# Patient Record
Sex: Female | Born: 1955 | Race: Black or African American | Hispanic: No | Marital: Married | Smoking: Never smoker
Health system: Southern US, Community
[De-identification: ages and names within clinical notes are randomized; demographics above are authoritative.]

## PROBLEM LIST (undated history)

## (undated) DIAGNOSIS — S065XAA Traumatic subdural hemorrhage with loss of consciousness status unknown, initial encounter: Secondary | ICD-10-CM

## (undated) DIAGNOSIS — IMO0001 Reserved for inherently not codable concepts without codable children: Secondary | ICD-10-CM

## (undated) DIAGNOSIS — D649 Anemia, unspecified: Secondary | ICD-10-CM

## (undated) DIAGNOSIS — N186 End stage renal disease: Secondary | ICD-10-CM

## (undated) DIAGNOSIS — K5900 Constipation, unspecified: Secondary | ICD-10-CM

## (undated) DIAGNOSIS — I10 Essential (primary) hypertension: Secondary | ICD-10-CM

## (undated) DIAGNOSIS — R079 Chest pain, unspecified: Principal | ICD-10-CM

## (undated) DIAGNOSIS — K219 Gastro-esophageal reflux disease without esophagitis: Secondary | ICD-10-CM

## (undated) DIAGNOSIS — C801 Malignant (primary) neoplasm, unspecified: Secondary | ICD-10-CM

## (undated) DIAGNOSIS — C50919 Malignant neoplasm of unspecified site of unspecified female breast: Secondary | ICD-10-CM

## (undated) DIAGNOSIS — I509 Heart failure, unspecified: Secondary | ICD-10-CM

## (undated) DIAGNOSIS — D131 Benign neoplasm of stomach: Secondary | ICD-10-CM

## (undated) DIAGNOSIS — R011 Cardiac murmur, unspecified: Secondary | ICD-10-CM

## (undated) DIAGNOSIS — T4145XA Adverse effect of unspecified anesthetic, initial encounter: Secondary | ICD-10-CM

## (undated) DIAGNOSIS — Z992 Dependence on renal dialysis: Secondary | ICD-10-CM

## (undated) DIAGNOSIS — I499 Cardiac arrhythmia, unspecified: Secondary | ICD-10-CM

## (undated) DIAGNOSIS — M1990 Unspecified osteoarthritis, unspecified site: Secondary | ICD-10-CM

## (undated) HISTORY — PX: BUNIONECTOMY: SHX129

## (undated) HISTORY — DX: Unspecified osteoarthritis, unspecified site: M19.90

## (undated) HISTORY — DX: Essential (primary) hypertension: I10

## (undated) HISTORY — PX: KNEE ARTHROSCOPY: SUR90

## (undated) HISTORY — DX: Gastro-esophageal reflux disease without esophagitis: K21.9

## (undated) HISTORY — DX: Benign neoplasm of stomach: D13.1

## (undated) HISTORY — PX: MASTECTOMY: SHX3

## (undated) HISTORY — DX: Dependence on renal dialysis: Z99.2

## (undated) HISTORY — PX: COLONOSCOPY: SHX174

## (undated) HISTORY — PX: BREAST BIOPSY: SHX20

## (undated) HISTORY — DX: End stage renal disease: N18.6

## (undated) HISTORY — DX: Chest pain, unspecified: R07.9

## (undated) HISTORY — DX: Malignant neoplasm of unspecified site of unspecified female breast: C50.919

---

## 1995-01-17 HISTORY — PX: ROTATOR CUFF REPAIR: SHX139

## 1998-03-26 ENCOUNTER — Emergency Department (HOSPITAL_COMMUNITY): Admission: EM | Admit: 1998-03-26 | Discharge: 1998-03-26 | Payer: Self-pay | Admitting: Emergency Medicine

## 2000-04-04 ENCOUNTER — Encounter (HOSPITAL_BASED_OUTPATIENT_CLINIC_OR_DEPARTMENT_OTHER): Payer: Self-pay | Admitting: General Surgery

## 2000-04-04 ENCOUNTER — Encounter: Admission: RE | Admit: 2000-04-04 | Discharge: 2000-04-04 | Payer: Self-pay | Admitting: General Surgery

## 2000-04-04 ENCOUNTER — Encounter (INDEPENDENT_AMBULATORY_CARE_PROVIDER_SITE_OTHER): Payer: Self-pay | Admitting: Specialist

## 2000-04-04 ENCOUNTER — Other Ambulatory Visit: Admission: RE | Admit: 2000-04-04 | Discharge: 2000-04-04 | Payer: Self-pay | Admitting: General Surgery

## 2000-10-25 ENCOUNTER — Encounter: Payer: Self-pay | Admitting: Gastroenterology

## 2000-10-25 ENCOUNTER — Ambulatory Visit (HOSPITAL_COMMUNITY): Admission: RE | Admit: 2000-10-25 | Discharge: 2000-10-25 | Payer: Self-pay | Admitting: Gastroenterology

## 2000-10-30 ENCOUNTER — Encounter (HOSPITAL_BASED_OUTPATIENT_CLINIC_OR_DEPARTMENT_OTHER): Payer: Self-pay | Admitting: General Surgery

## 2000-11-01 ENCOUNTER — Ambulatory Visit (HOSPITAL_COMMUNITY): Admission: RE | Admit: 2000-11-01 | Discharge: 2000-11-02 | Payer: Self-pay | Admitting: General Surgery

## 2000-11-01 ENCOUNTER — Encounter (INDEPENDENT_AMBULATORY_CARE_PROVIDER_SITE_OTHER): Payer: Self-pay | Admitting: *Deleted

## 2000-11-01 ENCOUNTER — Encounter (HOSPITAL_BASED_OUTPATIENT_CLINIC_OR_DEPARTMENT_OTHER): Payer: Self-pay | Admitting: General Surgery

## 2001-01-26 ENCOUNTER — Emergency Department (HOSPITAL_COMMUNITY): Admission: EM | Admit: 2001-01-26 | Discharge: 2001-01-26 | Payer: Self-pay | Admitting: Emergency Medicine

## 2001-08-21 ENCOUNTER — Ambulatory Visit (HOSPITAL_COMMUNITY): Admission: RE | Admit: 2001-08-21 | Discharge: 2001-08-21 | Payer: Self-pay | Admitting: Family Medicine

## 2001-11-21 ENCOUNTER — Encounter: Payer: Self-pay | Admitting: Emergency Medicine

## 2001-11-21 ENCOUNTER — Emergency Department (HOSPITAL_COMMUNITY): Admission: EM | Admit: 2001-11-21 | Discharge: 2001-11-21 | Payer: Self-pay | Admitting: Emergency Medicine

## 2003-01-08 ENCOUNTER — Ambulatory Visit (HOSPITAL_COMMUNITY): Admission: RE | Admit: 2003-01-08 | Discharge: 2003-01-08 | Payer: Self-pay | Admitting: Obstetrics and Gynecology

## 2003-01-17 DIAGNOSIS — T8859XA Other complications of anesthesia, initial encounter: Secondary | ICD-10-CM

## 2003-01-17 HISTORY — PX: CHOLECYSTECTOMY: SHX55

## 2003-01-17 HISTORY — DX: Other complications of anesthesia, initial encounter: T88.59XA

## 2003-01-17 HISTORY — PX: ABDOMINAL HYSTERECTOMY: SHX81

## 2003-02-12 ENCOUNTER — Encounter: Admission: RE | Admit: 2003-02-12 | Discharge: 2003-02-12 | Payer: Self-pay | Admitting: Nephrology

## 2003-03-27 ENCOUNTER — Encounter: Admission: RE | Admit: 2003-03-27 | Discharge: 2003-03-27 | Payer: Self-pay | Admitting: Obstetrics and Gynecology

## 2003-07-24 ENCOUNTER — Emergency Department (HOSPITAL_COMMUNITY): Admission: EM | Admit: 2003-07-24 | Discharge: 2003-07-24 | Payer: Self-pay | Admitting: Family Medicine

## 2003-08-13 ENCOUNTER — Other Ambulatory Visit: Admission: RE | Admit: 2003-08-13 | Discharge: 2003-08-13 | Payer: Self-pay | Admitting: Obstetrics and Gynecology

## 2003-08-27 ENCOUNTER — Inpatient Hospital Stay (HOSPITAL_COMMUNITY): Admission: RE | Admit: 2003-08-27 | Discharge: 2003-08-29 | Payer: Self-pay | Admitting: Obstetrics and Gynecology

## 2003-08-27 ENCOUNTER — Encounter (INDEPENDENT_AMBULATORY_CARE_PROVIDER_SITE_OTHER): Payer: Self-pay | Admitting: Specialist

## 2003-11-03 ENCOUNTER — Emergency Department (HOSPITAL_COMMUNITY): Admission: EM | Admit: 2003-11-03 | Discharge: 2003-11-03 | Payer: Self-pay | Admitting: Emergency Medicine

## 2004-08-05 ENCOUNTER — Other Ambulatory Visit: Admission: RE | Admit: 2004-08-05 | Discharge: 2004-08-05 | Payer: Self-pay | Admitting: Obstetrics and Gynecology

## 2004-09-08 ENCOUNTER — Encounter: Admission: RE | Admit: 2004-09-08 | Discharge: 2004-12-07 | Payer: Self-pay | Admitting: Internal Medicine

## 2004-09-15 ENCOUNTER — Encounter: Admission: RE | Admit: 2004-09-15 | Discharge: 2004-09-15 | Payer: Self-pay | Admitting: Family Medicine

## 2005-09-15 ENCOUNTER — Other Ambulatory Visit: Admission: RE | Admit: 2005-09-15 | Discharge: 2005-09-15 | Payer: Self-pay | Admitting: Obstetrics and Gynecology

## 2005-09-20 ENCOUNTER — Encounter: Admission: RE | Admit: 2005-09-20 | Discharge: 2005-09-20 | Payer: Self-pay | Admitting: Obstetrics and Gynecology

## 2006-05-03 ENCOUNTER — Emergency Department (HOSPITAL_COMMUNITY): Admission: EM | Admit: 2006-05-03 | Discharge: 2006-05-03 | Payer: Self-pay | Admitting: Emergency Medicine

## 2006-06-15 ENCOUNTER — Encounter: Admission: RE | Admit: 2006-06-15 | Discharge: 2006-09-13 | Payer: Self-pay | Admitting: Internal Medicine

## 2006-11-28 ENCOUNTER — Encounter (HOSPITAL_COMMUNITY): Admission: RE | Admit: 2006-11-28 | Discharge: 2007-01-28 | Payer: Self-pay | Admitting: Nephrology

## 2007-02-08 ENCOUNTER — Emergency Department (HOSPITAL_COMMUNITY): Admission: EM | Admit: 2007-02-08 | Discharge: 2007-02-08 | Payer: Self-pay | Admitting: Emergency Medicine

## 2007-10-31 ENCOUNTER — Emergency Department (HOSPITAL_COMMUNITY): Admission: EM | Admit: 2007-10-31 | Discharge: 2007-10-31 | Payer: Self-pay | Admitting: Family Medicine

## 2010-02-06 ENCOUNTER — Encounter: Payer: Self-pay | Admitting: Nephrology

## 2010-06-03 NOTE — Op Note (Signed)
[place]. [facility]  Patient:    Karen [name], Karen [name] Visit Number: [id number] MRN: [id number]          Service Type: DSU Location: [phone] Attending Physician:  Sherolyn Buba Dictated by:   Candee Furbish. Bubba Camp, M.D. Proc. Date: 11/01/00 Admit Date:  11/01/2000                             Operative Report  PREOPERATIVE DIAGNOSIS:  Chronic calculous cholecystitis.  POSTOPERATIVE DIAGNOSIS:  Chronic calculous cholecystitis.  PROCEDURE:  Laparoscopic cholecystectomy with intraoperative cholangiogram.  SURGEON:  Saralyn Pilar L. Bubba Camp, M.D.  ASSISTANT:  Maia Plan. Lindon Romp, M.D.  ANESTHESIA:  General.  CLINICAL NOTE:  This patient is a 55 year old woman presenting with upper abdominal pain associated with nausea and on gallbladder ultrasound noted to have cholelithiasis.  Liver function studies, amylase within normal limits. No history of fevers, chills, or jaundice.  Brought now to the operating room for laparoscopic cholecystectomy.  DESCRIPTION OF PROCEDURE:  Following the induction of satisfactory general anesthesia with the patient positioned supinely, the abdomen was prepped and draped routinely.  Open laparoscopy at the umbilicus and insertion of the Hasson cannula is carried out.  The peritoneal cavity inflated to 14 mmHg pressure using carbon dioxide, camera inserted, and a visual exploration carried out.  The liver edges were sharp and the surfaces smooth.  The anterior gastric wall, duodenal sweep were somewhat tethered and scarred up to the gallbladder.  The pelvic organs were not visualized.  None of the small and large intestine appeared to be abnormal.  Under direct vision, epigastric and lateral ports placed.  The gallbladder was grasped and retracted cephalad and multiple adhesions to the gallbladder wall were taken down, exposing the ampulla.  The ampulla was grasped and dissection carried down in the region of the ampulla to  isolate the cystic artery and cystic duct.  The cystic artery was doubly clipped and transected.  The cystic duct was identified, traced up to its entry into the gallbladder, and clipped proximally and opened.  I used a Reddick catheter to insert a 14-gauge Angiocath into the cystic duct and used one-half strength Hypaque and injected the biliary system.  The resulting cholangiogram showed free flow of contrast to the duodenum and no filling defects, and upper radicles appeared to be normal.  Cholangiocatheter was removed, the cystic duct doubly clipped and transected and the gallbladder dissected free from the liver bed using electrocautery.  At the end of the dissection, the liver bed was again inspected for hemostasis and noted to be dry.  The right upper quadrant was then thoroughly irrigated.  The camera removed to the epigastric port, the gallbladder retrieved through the umbilical port.  This was forwarded for pathologic evaluation.  Sponge, instrument, and sharp counts were verified and the trocars removed under direct vision.  Wounds closed in layers as follows:  Umbilical wound in two layers with 0 Dexon and 4-0 Dexon.  Epigastric and lateral flank wounds closed with 4-0 Dexon sutures, and all wounds reinforced with Steri-Strips.  Sterile dressings applied.  Anesthetic reversed.  Patient removed from the operating room to the recovery room in stable condition.  She tolerated the procedure well. Dictated by:   Candee Furbish. Bubba Camp, M.D. Attending Physician:  Sherolyn Buba DD:  11/01/00 TD:  11/02/00 Job: [id number]
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
## 2010-06-03 NOTE — Op Note (Signed)
NAME:  Karen [name], Karen [name]                    ACCOUNT NO.:  [id number]   MEDICAL RECORD NO.:  [id number]                   PATIENT TYPE:  INP   LOCATION:  9374                                 FACILITY:  [facility]   PHYSICIAN:  Everett Graff, M.D.                DATE OF BIRTH:  07-22-1955   DATE OF PROCEDURE:  08/27/2003  DATE OF DISCHARGE:                                 OPERATIVE REPORT   PREOPERATIVE DIAGNOSES:  1. Symptomatic uterine prolapse, cystocele and rectocele.  2. Cross renal ectopia.  3. Chronic renal insufficiency.  4. Hypertension.   POSTOPERATIVE DIAGNOSES:  1. Symptomatic uterine prolapse, cystocele and rectocele.  2. Cross renal ectopia.  3. Chronic renal insufficiency.  4. Hypertension.   PROCEDURE:  Total vaginal hysterectomy with anterior and posterior repair,  retrograde pyelogram, cystoscopy and ureteral stent placement.   ANESTHESIA:  General.   ATTENDING PHYSICIAN:  Everett Graff, M.D.   ASSISTANT:  Jon Billings. Elizebeth Koller.   SURGEON FOR CYSTOSCOPY RETROGRADE PYELOGRAM AND URETERAL STENT PLACEMENT:  Hanley Ben, M.D. of urology.   ESTIMATED BLOOD LOSS:  425 mL.   IV FLUIDS:  3400 mL.   URINE OUTPUT:  225 mL.   FINDINGS:  Normal size uterus with stage 3 prolapse, stage 2 cystocele and  stage 1-2 rectocele with normal appearing bilateral ovaries and fallopian  tubes with some dilation bilaterally of fallopian tubes. The patient is  status post BTL.   COMPLICATIONS:  None.   PATHOLOGY SPECIMEN:  Uterus and cervix.   DESCRIPTION OF PROCEDURE:  The patient was taken to the operating room after  the risks, benefits, and alternatives were discussed with the patient. The  patient verbalized an understanding and consent signed and witnessed. The  patient was placed under general anesthesia and prepped and draped in a  normal sterile fashion.  The first part of the procedure was performed by  Dr. Janice Norrie, please see his dictation.  In summary, a  cystoscopy was performed  and bilateral ureteral orifices noted with efflux.  Retrograde pyelogram was  performed and the course of the right ureter was normal to the right kidney.  The left ureter, however, crossed over at the level of approximately L2 to  the kidney also on the right side.  Ureteral stents were placed without  difficulty.  The bladder was emptied with Foley and Foley and ureteral  stents left in place for remainder of procedure.  The cervix was injected  with dilute Pitressin after a weighted speculum and vaginal wall retractor  placed.  The cervix was then circumscribed with the Bovie. The posterior cul-  de-sac was then entered with the Mayo scissors and the uterosacral ligaments  bilaterally clamped, cut and suture ligated with the Heaney clamps.  The  vesicouterine peritoneum was dissected anteriorly and entered with the  Metzenbaum scissors.  In a sequential fashion, the Heaney clamps were used  to clamp, cut and suture ligate using #0  Vicryl, the bilateral cardinal  ligaments and remaining paracervical tissue along with the bilateral uterine  vessels and bilateral parametrial tissue.  The uterus was flipped and  exteriorized and the pedicles were clamped bilaterally with large Kelly  clamps. The pedicles were then cut and suture ligated after a #0 tie was  placed.  There was some bleeding noted at the pedicles and was made  hemostatic with 3-0 Vicryl.  Hemoccult culdoplasty stitch was placed and the  vaginal cuff was closed with #0 Vicryl using interrupted figure-of-eights.  The anterior vaginal wall was then identified and Allis clamps placed.  Dilute Pitressin was injected and the vaginal mucosa dissected away from the  underlying tissue.  Kelly plication stitches were then performed.  The  excess vaginal mucosa was then excised and the vaginal mucosa repaired with  2-0 Vicryl in a running locked fashion. Kelly plication stitches were also  performed with 2-0  Vicryl.  Attention was then turned to the posterior  vaginal wall where Allis clamps were placed and the vaginal mucosa injected  with dilute Pitressin and excised up the midline and the underlying tissue  dissected away from the posterior vaginal mucosa. Plication stitches were  performed once again using 2-0 Vicryl.  The excess posterior vaginal wall  tissue was excised and the posterior vaginal wall was repaired with 2-0  Vicryl in a running locked fashion.  The vagina was packed with 1 inch of  packing surrounded by Estrace. The ureteral stents were then removed without  difficulty.  A rectal was performed and no sutures noted through the rectal  mucosa.  Sponge, lap and needle count was correct. The patient tolerated the  procedure well and waiting currently for patient to be extubated and  returned to the recovery room.                                               Everett Graff, M.D.    AR/MEDQ  D:  08/27/2003  T:  08/28/2003  Job:  [id number]

## 2010-06-03 NOTE — Discharge Summary (Signed)
NAME:  Karen, [name]                    ACCOUNT NO.:  [id number]   MEDICAL RECORD NO.:  [id number]                   PATIENT TYPE:  INP   LOCATION:  9305                                 FACILITY:  [facility]   PHYSICIAN:  Everett Graff, M.D.                DATE OF BIRTH:  January 09, 1956   DATE OF ADMISSION:  08/27/2003  DATE OF DISCHARGE:  08/29/2003                                 DISCHARGE SUMMARY   DISCHARGE DIAGNOSES:  1.  Symptomatic uterine prolapse.  2.  Adenomyosis.  3.  Cystocele.  4.  Rectocele.  5.  Crossed renal ectopia.  6.  Chronic renal insufficiency.  7.  Hypertension.   OPERATION:  On the date of admission the patient underwent a total vaginal  hysterectomy with anterior/posterior repair, retrograde pyelogram with  placement of ureteral stents, and cystoscopy, tolerating all procedures  well.  The patient was found to have a normal-size uterus (stage 3  prolapse), cystocele (grade 2), rectocele (grade 1-2), with normal-appearing  tubes and ovaries.  The patient's ureters also appeared to follow normal  anatomic course and were patent bilaterally.   HISTORY OF PRESENT ILLNESS:  Karen [name] is a 55 year old gravida 2 para  2 who presents for vaginal hysterectomy with anterior/posterior repair  because of symptomatic pelvic relaxation.  Due to the patient's history of  crossed renal ectopia, the patient has been also scheduled to undergo a  retrograde pyelogram with placement of ureteral stents prior to procedure to  isolate positioning of her ureters.  Please see the patient's dictated  History and Physical Examination for details.   PREOPERATIVE PHYSICAL EXAMINATION:  VITAL SIGNS:  Blood pressure 110/70,  weight 210 pounds.  GENERAL:  Within normal limits.  PELVIC:  External genitalia within normal limits.  Vagina within normal  limits except for stage 1 cystocele, stage 1-2 rectocele, and stage 3  uterine prolapse.   HOSPITAL COURSE:  On the date of  admission the patient underwent  aforementioned procedure, tolerating it well.  Postoperative course was  marked by the patient being slow to emerge from anesthesia and was therefore  placed in adult intensive care unit overnight.  The patient later reports  that she has had previous history of being very sensitive to  anesthesia/narcotics.  By the morning of postoperative day #1 the patient,  though groggy, was much more responsive, alert and oriented x3.  Postoperative hemoglobin was 9.4 (preoperative hemoglobin was 10.9).  The  patient continued to progress such that by postoperative day #2 she had  resumed bowel and bladder function and was therefore deemed ready for  discharge home.   DISCHARGE MEDICATIONS:  1.  The patient was advised to continue her prehospital medications.  2.  Iron one tablet twice daily for 6 weeks.  3.  Colace 100 mg twice daily until bowel movements are regular.  4.  Phenergan 12.5 mg one tablet q.6h. as needed for nausea.  5.  Vicodin  one to two tablets q.6h. as needed for severe pain.   FOLLOW-UP:  The patient is scheduled for a 6 weeks postoperative visit with  Dr. Mancel Bale on October 08, 2003 at 4 p.m.  The patient also was advised to  call Dr. Criss Rosales to schedule an appointment for ongoing blood pressure  management.   DISCHARGE INSTRUCTIONS:  The patient was given a copy of [facility] postoperative instruction sheet.  She was further advised to avoid  driving for 2 weeks, heavy lifting for 4 weeks, intercourse for 6 weeks, and  to call should she experience severe abdominal pain or increased vaginal  bleeding.  The patient's diet was without restriction.   FINAL PATHOLOGY:  Uterus and cervix:  Cervix:  Hyperkeratosis, cervicitis,  and nabothian cyst consistent with prolapse; no dysplasia identified.  Weakly-proliferative endometrium, no hyperplasia or carcinoma identified;  adenomyosis; benign uterine serosa.     [name].                    Everett Graff, M.D.    EJP/MEDQ  D:  09/16/2003  T:  09/17/2003  Job:  [id number]

## 2010-06-03 NOTE — Op Note (Signed)
NAME:  Karen [name], Karen [name]                    ACCOUNT NO.:  [id number]   MEDICAL RECORD NO.:  [id number]                   PATIENT TYPE:  INP   LOCATION:  9305                                 FACILITY:  [facility]   PHYSICIAN:  Hanley Ben, M.D.               DATE OF BIRTH:  [birth year]   DATE OF PROCEDURE:  08/27/2003  DATE OF DISCHARGE:                                 OPERATIVE REPORT   PREOPERATIVE DIAGNOSIS:  Vaginal prolapse and crossed renal ectopia.   OPERATION/PROCEDURE:  1. Cystoscopy.  2. Bilateral retrograde pyelograms.  3. Insertion of bilateral ureteral catheters.   SURGEON:  Hanley Ben, M.D.   ANESTHESIA:  General.   INDICATIONS:  The patient is a 56 year old female is scheduled for vaginal  hysterectomy by Dr. Everett Graff.  She had a renal ultrasound done for elevated BUN and creatinine  and was found to have crossed renal ectopia and both kidneys are on the  right side.  At Dr. Mancel Bale request, we will do cystoscopy and bilateral  retrograde pyelogram and insertion of bilateral ureteral catheters.   DESCRIPTION OF PROCEDURE:  Under general anesthesia the patient was prepped  and draped and placed in the dorsal lithotomy position.  A 22 Wappler  cystoscope was inserted in the bladder.  The bladder mucosa is normal.  There is no stone or tumor in the bladder.  The ureteral orifices are in  normal position and shape with clear efflux.  A cone-tip catheter was passed  through the cystoscope into the right ureteral orifice.  Contrast was then  injected through the cone-tip catheter.  The ureter is normal.  The renal  pelvis.  There is no evidence of ureteral obstruction.  The cone-tip  catheter was passed through the left ureteral orifice and contrast was  injected through the cone-tip catheter.  The ureteral crossed the midline in  the area of sacroiliac joint towards the right flank where the right and  left kidneys are located.  There is no evidence of  hydronephrosis.  The cone-  tip catheter was removed and open-end catheter was then passed through the  cystoscope over a glide wire and advanced into the renal pelvis.  The same  procedure was done on the right side.  The cystoscope was then removed and  #16 Foley catheter was then passed in the bladder and both ureteral  catheters passed through the Foley catheter and the Foley catheter was  connected to a drainage bag.  The patient was then left in dorsal lithotomy  position for a hysterectomy by Dr. Mancel Bale.                                               Hanley Ben, M.D.    MN/MEDQ  D:  08/27/2003  T:  08/28/2003  Job:  [id number]   cc:   Everett Graff, M.D.  Fax: [phone]

## 2010-10-06 LAB — POCT URINALYSIS DIP (DEVICE)
Glucose, UA: NEGATIVE
Hgb urine dipstick: NEGATIVE
Nitrite: NEGATIVE
Protein, ur: NEGATIVE
Urobilinogen, UA: 0.2
pH: 7

## 2010-10-06 LAB — GC/CHLAMYDIA PROBE AMP, GENITAL: Chlamydia, DNA Probe: NEGATIVE

## 2010-10-06 LAB — WET PREP, GENITAL
Clue Cells Wet Prep HPF POC: NONE SEEN
WBC, Wet Prep HPF POC: NONE SEEN

## 2010-10-17 LAB — POCT URINALYSIS DIP (DEVICE)
Bilirubin Urine: NEGATIVE
Glucose, UA: NEGATIVE
Ketones, ur: NEGATIVE
Nitrite: NEGATIVE
Operator id: 239701

## 2010-10-17 LAB — WET PREP, GENITAL
Trich, Wet Prep: NONE SEEN
Yeast Wet Prep HPF POC: NONE SEEN

## 2011-01-13 ENCOUNTER — Other Ambulatory Visit (HOSPITAL_COMMUNITY): Payer: Self-pay | Admitting: Nephrology

## 2011-01-13 DIAGNOSIS — I701 Atherosclerosis of renal artery: Secondary | ICD-10-CM

## 2011-01-13 DIAGNOSIS — T8619 Other complication of kidney transplant: Secondary | ICD-10-CM

## 2011-01-20 ENCOUNTER — Ambulatory Visit (HOSPITAL_COMMUNITY)
Admission: RE | Admit: 2011-01-20 | Discharge: 2011-01-20 | Disposition: A | Discharge status: Home / Self Care | Payer: 59 | Source: Ambulatory Visit | Attending: Nephrology | Admitting: Nephrology

## 2011-01-20 DIAGNOSIS — Q638 Other specified congenital malformations of kidney: Secondary | ICD-10-CM | POA: Insufficient documentation

## 2011-01-20 DIAGNOSIS — I701 Atherosclerosis of renal artery: Secondary | ICD-10-CM

## 2011-01-20 DIAGNOSIS — I129 Hypertensive chronic kidney disease with stage 1 through stage 4 chronic kidney disease, or unspecified chronic kidney disease: Secondary | ICD-10-CM | POA: Insufficient documentation

## 2011-01-20 DIAGNOSIS — N189 Chronic kidney disease, unspecified: Secondary | ICD-10-CM | POA: Insufficient documentation

## 2011-01-23 ENCOUNTER — Other Ambulatory Visit (HOSPITAL_COMMUNITY): Payer: Self-pay | Admitting: Nephrology

## 2011-01-23 ENCOUNTER — Inpatient Hospital Stay (HOSPITAL_COMMUNITY): Admission: RE | Admit: 2011-01-23 | Payer: 59 | Source: Ambulatory Visit

## 2011-01-23 DIAGNOSIS — T8619 Other complication of kidney transplant: Secondary | ICD-10-CM

## 2011-06-27 ENCOUNTER — Other Ambulatory Visit: Payer: Self-pay

## 2011-06-27 DIAGNOSIS — N184 Chronic kidney disease, stage 4 (severe): Secondary | ICD-10-CM

## 2011-06-27 DIAGNOSIS — Z01810 Encounter for preprocedural cardiovascular examination: Secondary | ICD-10-CM

## 2011-06-30 ENCOUNTER — Encounter: Payer: Self-pay | Admitting: Vascular Surgery

## 2011-07-03 ENCOUNTER — Encounter: Payer: Self-pay | Admitting: Vascular Surgery

## 2011-07-04 ENCOUNTER — Encounter: Payer: Self-pay | Admitting: Vascular Surgery

## 2011-07-04 ENCOUNTER — Ambulatory Visit (INDEPENDENT_AMBULATORY_CARE_PROVIDER_SITE_OTHER): Payer: 59 | Admitting: Vascular Surgery

## 2011-07-04 ENCOUNTER — Encounter (INDEPENDENT_AMBULATORY_CARE_PROVIDER_SITE_OTHER): Payer: 59 | Admitting: *Deleted

## 2011-07-04 VITALS — BP 137/82 | HR 74 | Temp 98.7°F | Ht 61.0 in | Wt 218.0 lb

## 2011-07-04 DIAGNOSIS — Z992 Dependence on renal dialysis: Secondary | ICD-10-CM | POA: Insufficient documentation

## 2011-07-04 DIAGNOSIS — N184 Chronic kidney disease, stage 4 (severe): Secondary | ICD-10-CM

## 2011-07-04 DIAGNOSIS — N186 End stage renal disease: Secondary | ICD-10-CM | POA: Insufficient documentation

## 2011-07-04 DIAGNOSIS — Z01810 Encounter for preprocedural cardiovascular examination: Secondary | ICD-10-CM

## 2011-07-04 NOTE — Progress Notes (Signed)
The patient presents today for evaluation of AV access. He is a very pleasant 56 year old female with progressive renal insufficiency. She has stage IV kidney disease with creatinine in the mid 3 range. She's had no prior hemodialysis access.  Past Medical History  Diagnosis Date  . Peripheral vascular disease   . Chronic kidney disease     History  Substance Use Topics  . Smoking status: Never Smoker   . Smokeless tobacco: Never Used  . Alcohol Use: No    Family History  Problem Relation Age of Onset  . Diabetes Father   . Heart attack Father   . Diabetes Sister   . Hyperlipidemia Sister   . Hypertension Sister     No Known Allergies  Current outpatient prescriptions:acetaminophen (TYLENOL) 500 MG tablet, Take 500 mg by mouth every 6 (six) hours as needed., Disp: , Rfl: ;  amLODipine (NORVASC) 10 MG tablet, Take 10 mg by mouth daily., Disp: , Rfl: ;  Fe Fum-FePoly-Vit C-Vit B3 (INTEGRA PO), Take by mouth daily., Disp: , Rfl: ;  furosemide (LASIX) 20 MG tablet, Take 20 mg by mouth daily., Disp: , Rfl: ;  losartan (COZAAR) 50 MG tablet, Take 50 mg by mouth daily., Disp: , Rfl:  paricalcitol (ZEMPLAR) 1 MCG capsule, Take 1 mcg by mouth daily., Disp: , Rfl: ;  Fe Fum-FA-B Cmp-C-Zn-Mg-Mn-Cu (HEMATINIC PLUS COMPLEX PO), Take by mouth., Disp: , Rfl: ;  RABEprazole (ACIPHEX) 20 MG tablet, Take 20 mg by mouth daily., Disp: , Rfl:   BP 137/82  Pulse 74  Temp 98.7 F (37.1 C) (Oral)  Ht 5\' 1"  (1.549 m)  Wt 218 lb (98.884 kg)  BMI 41.19 kg/m2  SpO2 100%  Body mass index is 41.19 kg/(m^2).       Review of systems positive for pain in her legs with walking and swelling in her legs. Otherwise review of systems negative  Physical exam well-developed well-nourished black female in no acute distress. HEENT normal. She has 2+ radial pulses bilaterally. Neurologically she is grossly intact. Skin without ulcers or rashes. Extremities without major deformities. She does have very small  surface veins on physical exam.  Vascular lab study was ordered and interpreted with the patient. This reveals an extremely small surface veins bilaterally. This is nonvisualized on the right antecubital and above the elbow upper arm. It is in the 1 mm range in the left arm from the mid forearm up to the upper arm. On the basilic vein imaging she also has a small vein which is less than 3 mm throughout its course. I imaged the vein itself with SonoSite to confirm this.  Impression and plan chronic renal insufficiency. She has extremely small cephalic and basilic veins bilaterally. I discussed this at length with [name] area Options for AV graft AV fistula and catheter placement for acute hemodialysis. I do not feel that she has [place] adequate as surface veins for fistula attempt. I explained that she will require graft placement when she approaches need for hemodialysis. She will continue to be followed by the renal service and facet we be reconsult when she approaches need for hemodialysis access

## 2011-07-06 NOTE — Procedures (Unsigned)
CEPHALIC VEIN MAPPING  INDICATION:  Preoperative vein mapping for dialysis access placement.  HISTORY: Chronic kidney disease stage 4, hypertension.  EXAM:  The right cephalic vein measurements range from 0.17 to 0.08 cm.  The right basilic vein is compressible with diameter measurements ranging from 0.26 to 0.19 cm.  The left cephalic vein is compressible with diameter measurements ranging from 0.14 to 0.09 cm.  The left basilic vein is compressible with diameter measurements ranging from 0.25 to 0.22 cm.  IMPRESSION:  Patent right cephalic and basilic veins and patent left cephalic and basilic veins with diameter measurements as described above.  ___________________________________________ Rosetta Posner, M.D.  EM/MEDQ  D:  07/05/2011  T:  07/05/2011  Job:  [id number]

## 2011-09-12 ENCOUNTER — Encounter: Payer: 59 | Admitting: Obstetrics and Gynecology

## 2011-10-06 ENCOUNTER — Encounter: Payer: Self-pay | Admitting: Obstetrics and Gynecology

## 2011-10-06 ENCOUNTER — Ambulatory Visit (INDEPENDENT_AMBULATORY_CARE_PROVIDER_SITE_OTHER): Payer: 59 | Admitting: Obstetrics and Gynecology

## 2011-10-06 VITALS — BP 120/64 | HR 70 | Resp 16 | Ht 61.0 in | Wt 222.0 lb

## 2011-10-06 DIAGNOSIS — Z01419 Encounter for gynecological examination (general) (routine) without abnormal findings: Secondary | ICD-10-CM

## 2011-10-06 NOTE — Progress Notes (Signed)
Contraception Hysterectomy Last pap 2007 Wnl Last Mammo 2012 WNL Last Colonoscopy 2007 WNL Last Dexa Scan Done-- year? WNL Primary MD Jearl Klinefelter Abuse at Home None  No complaints.  Pt nearing end stage renal disease and about to be placed on transplant list.  In prep pt needs to have updated exams with her specialists and lose 30lbs.  Filed Vitals:   10/06/11 1428  BP: 120/64  Pulse: 70  Resp: 16   ROS: noncontributory  Physical Examination: General appearance - alert, well appearing, and in no distress Neck - supple, no significant adenopathy Chest - clear to auscultation, no wheezes, rales or rhonchi, symmetric air entry Heart - normal rate and regular rhythm Abdomen - soft, nontender, nondistended, no masses or organomegaly Breasts - breasts appear normal, no suspicious masses, no skin or nipple changes or axillary nodes Pelvic - normal external genitalia, vulva, vagina, and adnexa, s/p hysterectomy Back exam - no CVAT Extremities - no edema, redness or tenderness in the calves or thighs  A/P No further paps required secondary to h/o hysterectomy for benign disease and pt denies h/o abnl paps Rec AEX q yr mammo in Dec

## 2011-10-09 ENCOUNTER — Encounter: Payer: 59 | Admitting: Obstetrics and Gynecology

## 2012-01-18 ENCOUNTER — Other Ambulatory Visit (HOSPITAL_COMMUNITY): Payer: Self-pay | Admitting: *Deleted

## 2012-01-19 ENCOUNTER — Encounter (HOSPITAL_COMMUNITY)
Admission: RE | Admit: 2012-01-19 | Discharge: 2012-01-19 | Disposition: A | Discharge status: Home / Self Care | Payer: 59 | Source: Ambulatory Visit | Attending: Nephrology | Admitting: Nephrology

## 2012-01-19 DIAGNOSIS — I120 Hypertensive chronic kidney disease with stage 5 chronic kidney disease or end stage renal disease: Secondary | ICD-10-CM | POA: Insufficient documentation

## 2012-01-19 DIAGNOSIS — N186 End stage renal disease: Secondary | ICD-10-CM | POA: Insufficient documentation

## 2012-01-19 MED ORDER — SODIUM CHLORIDE 0.9 % IV SOLN
1020.0000 mg | Freq: Once | INTRAVENOUS | Status: AC
  Administered 2012-01-19: 1020 mg via INTRAVENOUS
  Filled 2012-01-19: qty 34

## 2012-03-06 ENCOUNTER — Ambulatory Visit (HOSPITAL_COMMUNITY)
Admission: RE | Admit: 2012-03-06 | Discharge: 2012-03-06 | Disposition: A | Discharge status: Home / Self Care | Payer: 59 | Source: Ambulatory Visit | Attending: Family Medicine | Admitting: Family Medicine

## 2012-03-06 ENCOUNTER — Other Ambulatory Visit (HOSPITAL_COMMUNITY): Payer: Self-pay | Admitting: Family Medicine

## 2012-03-06 DIAGNOSIS — M79609 Pain in unspecified limb: Secondary | ICD-10-CM | POA: Insufficient documentation

## 2012-03-06 DIAGNOSIS — R52 Pain, unspecified: Secondary | ICD-10-CM

## 2012-03-06 DIAGNOSIS — R609 Edema, unspecified: Secondary | ICD-10-CM

## 2012-03-06 NOTE — Progress Notes (Signed)
VASCULAR LAB PRELIMINARY  PRELIMINARY  PRELIMINARY  PRELIMINARY  Right lower extremity venous duplex completed.    Preliminary report:  Right:  No evidence of DVT, superficial thrombosis, or Baker's cyst.  Karen [name], RVS 03/06/2012, 6:24 PM

## 2012-06-12 ENCOUNTER — Ambulatory Visit (INDEPENDENT_AMBULATORY_CARE_PROVIDER_SITE_OTHER): Payer: 59 | Admitting: Cardiovascular Disease

## 2012-06-12 ENCOUNTER — Encounter: Payer: Self-pay | Admitting: Cardiovascular Disease

## 2012-06-12 VITALS — BP 128/88 | HR 70 | Ht 61.0 in | Wt 219.0 lb

## 2012-06-12 DIAGNOSIS — Z87898 Personal history of other specified conditions: Secondary | ICD-10-CM | POA: Insufficient documentation

## 2012-06-12 DIAGNOSIS — R079 Chest pain, unspecified: Secondary | ICD-10-CM

## 2012-06-12 MED ORDER — PANTOPRAZOLE SODIUM 40 MG PO TBEC: 40.0000 mg | DELAYED_RELEASE_TABLET | Freq: Every day | ORAL | Status: DC

## 2012-06-12 MED ORDER — ISOSORBIDE MONONITRATE ER 30 MG PO TB24: ORAL_TABLET | ORAL | Status: DC

## 2012-06-12 NOTE — Patient Instructions (Signed)
  Your physician wants you to follow-up with him after the stress test.                                                         Your physician has recommended you make the following change in your medication: START PROTONIX 40MG  DAILY; START IMDUR 30MG  1/2 TABLET DAILY-THIS MAY CAUSE A HEADACHE.   Your physician has ordered a CARDIAC STRESS TEST ([place]) Your physician has requested that you have an EXERCISE myoview. For further information please visit [email or web address]. Please follow instruction sheet, as given.

## 2012-06-12 NOTE — Progress Notes (Signed)
06/12/2012 Karen [name]   27-Jul-1955  [id number]  Primary Physician Elyn Peers, MD Primary Cardiologist: Lorretta Harp MD Renae Gloss   HPI:  Karen [name] is a 57 year old married African American female mother of 2, grandmother to potential didn't who currently works as an Sales executive. I saw her 6 years ago in our office for evaluation of a fast heart rate which was worked up and ultimately resolved. Her affect is improved 2 hypertension but otherwise are negative. Her last 3 weeks has noticed substernal chest pain radiating to both upper extremities occurring on a daily basis. She does have reflux but she says the symptoms are different.   Current Outpatient Prescriptions  Medication Sig Dispense Refill  . acetaminophen (TYLENOL) 500 MG tablet Take 500 mg by mouth every 6 (six) hours as needed.      Marland Kitchen amLODipine (NORVASC) 10 MG tablet Take 10 mg by mouth daily.      . Fe Fum-FePoly-Vit C-Vit B3 (INTEGRA PO) Take by mouth daily.      . furosemide (LASIX) 20 MG tablet Take 20 mg by mouth daily.      Marland Kitchen losartan (COZAAR) 50 MG tablet Take 50 mg by mouth daily.      . paricalcitol (ZEMPLAR) 1 MCG capsule Take 1 mcg by mouth daily.       No current facility-administered medications for this visit.    No Known Allergies  History   Social History  . Marital Status: Married    Spouse Name: N/A    Number of Children: N/A  . Years of Education: N/A   Occupational History  . Not on file.   Social History Main Topics  . Smoking status: Never Smoker   . Smokeless tobacco: Never Used  . Alcohol Use: No  . Drug Use: No  . Sexually Active: Yes    Birth Control/ Protection: None     Comment: Hysterectomy   Other Topics Concern  . Not on file   Social History Narrative  . No narrative on file     Review of Systems: General: negative for chills, fever, night sweats or weight changes.  Cardiovascular: negative for chest pain, dyspnea on exertion, edema,  orthopnea, palpitations, paroxysmal nocturnal dyspnea or shortness of breath Dermatological: negative for rash Respiratory: negative for cough or wheezing Urologic: negative for hematuria Abdominal: negative for nausea, vomiting, diarrhea, bright red blood per rectum, melena, or hematemesis Neurologic: negative for visual changes, syncope, or dizziness All other systems reviewed and are otherwise negative except as noted above.    Blood pressure 128/88, pulse 70, height 5\' 1"  (1.549 m), weight 219 lb (99.338 kg).  General appearance: alert and no distress Neck: no adenopathy, no carotid bruit, no JVD, supple, symmetrical, trachea midline and thyroid not enlarged, symmetric, no tenderness/mass/nodules Lungs: clear to auscultation bilaterally Heart: regular rate and rhythm, S1, S2 normal, no murmur, click, rub or gallop Abdomen: soft, non-tender; bowel sounds normal; no masses,  no organomegaly Extremities: extremities normal, atraumatic, no cyanosis or edema Pulses: 2+ and symmetric  EKG normal sinus rhythm at 70 without ST or T wave changes  ASSESSMENT AND PLAN:   Chest pain Based on the patient's age and risk factors will proceed with exercise Myoview stress testing to rule out an ischemic etiology  HTN (hypertension) Well-controlled on current medications      Lorretta Harp MD [facility], [facility] 06/12/2012 1:07 PM

## 2012-06-12 NOTE — Assessment & Plan Note (Signed)
Based on the patient's age and risk factors will proceed with exercise Myoview stress testing to rule out an ischemic etiology

## 2012-06-12 NOTE — Assessment & Plan Note (Signed)
Well-controlled on current medications 

## 2012-06-18 ENCOUNTER — Ambulatory Visit (HOSPITAL_COMMUNITY)
Admission: RE | Admit: 2012-06-18 | Discharge: 2012-06-18 | Disposition: A | Discharge status: Home / Self Care | Payer: 59 | Source: Ambulatory Visit | Attending: Cardiovascular Disease | Admitting: Cardiovascular Disease

## 2012-06-18 DIAGNOSIS — E669 Obesity, unspecified: Secondary | ICD-10-CM | POA: Insufficient documentation

## 2012-06-18 DIAGNOSIS — R002 Palpitations: Secondary | ICD-10-CM | POA: Insufficient documentation

## 2012-06-18 DIAGNOSIS — R0602 Shortness of breath: Secondary | ICD-10-CM | POA: Insufficient documentation

## 2012-06-18 DIAGNOSIS — R5381 Other malaise: Secondary | ICD-10-CM | POA: Insufficient documentation

## 2012-06-18 DIAGNOSIS — R079 Chest pain, unspecified: Secondary | ICD-10-CM | POA: Insufficient documentation

## 2012-06-18 DIAGNOSIS — I10 Essential (primary) hypertension: Secondary | ICD-10-CM | POA: Insufficient documentation

## 2012-06-18 MED ORDER — TECHNETIUM TC 99M SESTAMIBI GENERIC - CARDIOLITE
10.5000 | Freq: Once | INTRAVENOUS | Status: AC | PRN
  Administered 2012-06-18: 11 via INTRAVENOUS

## 2012-06-18 MED ORDER — TECHNETIUM TC 99M SESTAMIBI GENERIC - CARDIOLITE
30.8000 | Freq: Once | INTRAVENOUS | Status: AC | PRN
  Administered 2012-06-18: 30.8 via INTRAVENOUS

## 2012-06-18 NOTE — Procedures (Addendum)
[place] [address] [place] [address] [phone]  Cardiology Nuclear Med Study  Karen [name] is a 57 y.o. female     MRN : [id number]     DOB: April 17, 1955  Procedure Date: 06/18/2012  Nuclear Med Background Indication for Stress Test:  Evaluation for Ischemia History:  NO PRIOR HISTORY REPORTED. Cardiac Risk Factors: Hypertension and Obesity  Symptoms:  Chest Pain, Fatigue, Palpitations and SOB   Nuclear Pre-Procedure Caffeine/Decaff Intake:  7:00pm NPO After: 5:00am   IV Site: R Antecubital  IV 0.9% NS with Angio Cath:  22g  Chest Size (in):  N/A IV Started by: Azucena Cecil, RN  Height: 5\' 1"  (1.549 m)  Cup Size: D  BMI:  Body mass index is 41.4 kg/(m^2). Weight:  219 lb (99.338 kg)   Tech Comments:  N/A    Nuclear Med Study 1 or 2 day study: 1 day  Stress Test Type:  Stress  Order Authorizing Provider:  Quay Burow, MD   Resting Radionuclide: Technetium 61m Sestamibi  Resting Radionuclide Dose: 10.5 mCi   Stress Radionuclide:  Technetium 68m Sestamibi  Stress Radionuclide Dose: 30.8 mCi           Stress Protocol Rest HR: 74 Stress HR: 157  Rest BP: 117/79 Stress BP: 187/74  Exercise Time (min): 5:00 METS: 4.6   Predicted Max HR: 163 bpm % Max HR: 96.32 bpm Rate Pressure Product: 29045  Dose of Adenosine (mg):  n/a Dose of Lexiscan: n/a mg  Dose of Atropine (mg): n/a Dose of Dobutamine: n/a mcg/kg/min (at max HR)  Stress Test Technologist: Leane Para, CCT Nuclear Technologist: Imagene Riches, CNMT   Rest Procedure:  Myocardial perfusion imaging was performed at rest 45 minutes following the intravenous administration of Technetium 47m Sestamibi. Stress Procedure:  The patient performed treadmill exercise using a Bruce  Protocol for 5:00 minutes. The patient stopped due to SOB and CP and denied any chest pain.  There were no significant ST-T wave changes.  Technetium 76m Sestamibi was  injected at peak exercise and myocardial perfusion imaging was performed after a brief delay.  Transient Ischemic Dilatation (Normal <1.22):  0.99 Lung/Heart Ratio (Normal <0.45):  0.40 QGS EDV:  71 ml QGS ESV:  23 ml LV Ejection Fraction: 68%      Rest ECG: NSR - Normal EKG  Stress ECG: No significant change from baseline ECG  QPS Raw Data Images:  Normal; no motion artifact; normal heart/lung ratio. Stress Images:  Normal homogeneous uptake in all areas of the myocardium. Rest Images:  Normal homogeneous uptake in all areas of the myocardium. Subtraction (SDS):  No evidence of ischemia.  Impression Exercise Capacity:  Fair exercise capacity. BP Response:  Normal blood pressure response. Clinical Symptoms:  No significant symptoms noted. ECG Impression:  No significant ST segment change suggestive of ischemia. Comparison with Prior Nuclear Study: No images to compare  Overall Impression:  Normal stress nuclear study.  LV Wall Motion:  NL LV Function; NL Wall Motion   Karen Hoose, MD  06/18/2012 1:27 PM

## 2012-06-26 ENCOUNTER — Encounter: Payer: Self-pay | Admitting: Cardiovascular Disease

## 2012-06-26 ENCOUNTER — Ambulatory Visit (INDEPENDENT_AMBULATORY_CARE_PROVIDER_SITE_OTHER): Payer: 59 | Admitting: Cardiovascular Disease

## 2012-06-26 VITALS — BP 126/62 | HR 80 | Ht 61.0 in | Wt 222.0 lb

## 2012-06-26 DIAGNOSIS — R079 Chest pain, unspecified: Secondary | ICD-10-CM

## 2012-06-26 NOTE — Patient Instructions (Addendum)
Your physician wants you to follow-up in: 2 months with an extender and 4 months with Dr Gwenlyn Found. You will receive a reminder letter in the mail two months in advance. If you don't receive a letter, please call our office to schedule the follow-up appointment.

## 2012-06-26 NOTE — Assessment & Plan Note (Signed)
A Myoview stress test was performed that was normal. I did put her on a low-dose oral nitrate which resulted in mild improvement in her symptoms but she still is getting daily off and on which chest pain which sounds somewhat atypical especially in light of the fact that she has minimal cardiac risk factors. At this point I'm going to continue to follow her clinically I will have her see mid-level provider back in 2 months me back in 4 months

## 2012-06-26 NOTE — Progress Notes (Signed)
06/26/2012 Oleh Genin   [birth year]  [id number]  Primary Physician Elyn Peers, MD Primary Cardiologist:'Sundeep Cary Adora Fridge MD Renae Gloss  HPI:   Karen [name] is a 57 year old married African American female mother of 2, grandmother to 2 grandchildren  who currently works as an Sales executive. I saw her 6 years ago in our office for evaluation of a fast heart rate which was worked up and ultimately resolved. Her cardiac risk factor profile is notable for hypertension but otherwise are negative. For the last 3 weeks prior to her last office visit she had noticed substernal chest pain radiating to both upper extremities occurring on a daily basis. She does have reflux but she says the symptoms are different. I ordered a mild distress test which was entirely normal. I put her on a low-dose nitrate which resulted in mild improvement in her symptoms although she still complains of some chest pain.    Current Outpatient Prescriptions  Medication Sig Dispense Refill  . acetaminophen (TYLENOL) 500 MG tablet Take 500 mg by mouth as needed.       Marland Kitchen amLODipine (NORVASC) 10 MG tablet Take 10 mg by mouth daily.      . Fe Fum-FePoly-Vit C-Vit B3 (INTEGRA PO) Take by mouth daily.      . furosemide (LASIX) 20 MG tablet Take 20 mg by mouth daily.      . isosorbide mononitrate (IMDUR) 30 MG 24 hr tablet Take 1/2 tablet by mouth daily  15 tablet  3  . losartan (COZAAR) 50 MG tablet Take 50 mg by mouth daily.      . pantoprazole (PROTONIX) 40 MG tablet Take 1 tablet (40 mg total) by mouth daily.  30 tablet  11  . paricalcitol (ZEMPLAR) 1 MCG capsule Take 1 mcg by mouth daily.       No current facility-administered medications for this visit.    No Known Allergies  History   Social History  . Marital Status: Married    Spouse Name: N/A    Number of Children: N/A  . Years of Education: N/A   Occupational History  . Not on file.   Social History Main Topics  . Smoking status:  Never Smoker   . Smokeless tobacco: Never Used  . Alcohol Use: No  . Drug Use: No  . Sexually Active: Yes    Birth Control/ Protection: None     Comment: Hysterectomy   Other Topics Concern  . Not on file   Social History Narrative  . No narrative on file     Review of Systems: General: negative for chills, fever, night sweats or weight changes.  Cardiovascular: negative for chest pain, dyspnea on exertion, edema, orthopnea, palpitations, paroxysmal nocturnal dyspnea or shortness of breath Dermatological: negative for rash Respiratory: negative for cough or wheezing Urologic: negative for hematuria Abdominal: negative for nausea, vomiting, diarrhea, bright red blood per rectum, melena, or hematemesis Neurologic: negative for visual changes, syncope, or dizziness All other systems reviewed and are otherwise negative except as noted above.    Blood pressure 126/62, pulse 80, height 5\' 1"  (1.549 m), weight 222 lb (100.699 kg).  General appearance: alert and no distress Neck: no adenopathy, no carotid bruit, no JVD, supple, symmetrical, trachea midline and thyroid not enlarged, symmetric, no tenderness/mass/nodules Lungs: clear to auscultation bilaterally Heart: regular rate and rhythm, S1, S2 normal, no murmur, click, rub or gallop Extremities: extremities normal, atraumatic, no cyanosis or edema  EKG not performed today  ASSESSMENT AND PLAN:  Chest pain A Myoview stress test was performed that was normal. I did put her on a low-dose oral nitrate which resulted in mild improvement in her symptoms but she still is getting daily off and on which chest pain which sounds somewhat atypical especially in light of the fact that she has minimal cardiac risk factors. At this point I'm going to continue to follow her clinically I will have her see mid-level provider back in 2 months me back in 4 months      Lorretta Harp MD [facility], [facility] 06/26/2012 3:13 PM

## 2012-07-05 ENCOUNTER — Encounter: Payer: Self-pay | Admitting: Cardiovascular Disease

## 2012-07-11 ENCOUNTER — Encounter: Payer: Self-pay | Admitting: Internal Medicine

## 2012-08-05 ENCOUNTER — Encounter: Payer: Self-pay | Admitting: Internal Medicine

## 2012-08-08 ENCOUNTER — Ambulatory Visit (INDEPENDENT_AMBULATORY_CARE_PROVIDER_SITE_OTHER): Payer: 59 | Admitting: Internal Medicine

## 2012-08-08 ENCOUNTER — Encounter: Payer: Self-pay | Admitting: Internal Medicine

## 2012-08-08 VITALS — BP 108/76 | HR 64 | Ht 61.0 in | Wt 217.0 lb

## 2012-08-08 DIAGNOSIS — R1013 Epigastric pain: Secondary | ICD-10-CM | POA: Insufficient documentation

## 2012-08-08 DIAGNOSIS — R110 Nausea: Secondary | ICD-10-CM

## 2012-08-08 DIAGNOSIS — Z1211 Encounter for screening for malignant neoplasm of colon: Secondary | ICD-10-CM

## 2012-08-08 DIAGNOSIS — K5900 Constipation, unspecified: Secondary | ICD-10-CM

## 2012-08-08 DIAGNOSIS — D638 Anemia in other chronic diseases classified elsewhere: Secondary | ICD-10-CM

## 2012-08-08 MED ORDER — ONDANSETRON 4 MG PO TBDP: 4.0000 mg | ORAL_TABLET | Freq: Three times a day (TID) | ORAL | Status: DC | PRN

## 2012-08-08 NOTE — Patient Instructions (Addendum)
Continue taking Pantoprazole  We have sent the following medications to your pharmacy for you to pick up at your convenience: Zofran  Follow up in 6-8 weeks in office                                                We are excited to introduce MyChart, a new best-in-class service that provides you online access to important information in your electronic medical record. We want to make it easier for you to view your health information - all in one secure location - when and where you need it. We expect MyChart will enhance the quality of care and service we provide.  When you register for MyChart, you can:    View your test results.    Request appointments and receive appointment reminders via email.    Request medication renewals.    View your medical history, allergies, medications and immunizations.    Communicate with your physician's office through a password-protected site.    Conveniently print information such as your medication lists.  To find out if MyChart is right for you, please talk to a member of our clinical staff today. We will gladly answer your questions about this free health and wellness tool.  If you are age 57 or older and want a member of your family to have access to your record, you must provide written consent by completing a proxy form available at our office. Please speak to our clinical staff about guidelines regarding accounts for patients younger than age 42.  As you activate your MyChart account and need any technical assistance, please call the MyChart technical support line at [phone]) or email your question to [email or web address]. If you email your question(s), please include your name, a return phone number and the best time to reach you.  If you have non-urgent health-related questions, you can send a message to our office through [place] at [email or web address]. If you have a medical emergency, call 911.  Thank you for  using MyChart as your new health and wellness resource!   MyChart licensed from [employer],  1999-2010. Patents Pending.

## 2012-08-08 NOTE — Progress Notes (Signed)
Patient ID: Karen [name], female   DOB: 25-Oct-1955, 57 y.o.   MRN: [id number] HPI: Karen [name] is a 57 year old female with a past medical history of stage IV chronic kidney disease, hypertension, GERD and arthritis who is seen in consultation at the request of Dr. Marval Regal to evaluate epigastric abdominal pain and nausea. The patient is here alone today. She reports she was having upper abdominal bloating and epigastric discomfort associated with eating. She was started on pantoprazole 40 mg daily in the last several months and she notes that this has significantly helped her upper abdominal bloating and pain. She is still dealing with nausea associated with eating, but she feels this might even be slightly better. She denies vomiting. No significant heartburn. No water brash or regurgitation. No dysphagia or odynophagia. She denies early satiety. Her nausea is triggered by eating and tends to happen more frequently when she eats meats. Bowel habits have been somewhat irregular and she is having some constipation. No blood in her stool or melena. No diarrhea. No lower abdominal pain.  She previously had an upper endoscopy and colonoscopy performed by Dr. Cristina Gong in 2007  Past Medical History  Diagnosis Date  . Chronic kidney disease     trying to get on kidney [facility] list at [facility]  . Hypertension   . Chest pain   . Arthritis   . GERD (gastroesophageal reflux disease)     Past Surgical History  Procedure Laterality Date  . Rotator cuff repair  1997    right   . Cholecystectomy  2005  . Abdominal hysterectomy    . Knee arthroscopy      both knees  . Bunionectomy      left and right foot    Current Outpatient Prescriptions  Medication Sig Dispense Refill  . acetaminophen (TYLENOL) 500 MG tablet Take 500 mg by mouth as needed.       Marland Kitchen amLODipine (NORVASC) 10 MG tablet Take 10 mg by mouth daily.      . Fe Fum-FePoly-Vit C-Vit B3 (INTEGRA PO) Take by mouth daily.      .  furosemide (LASIX) 20 MG tablet Take 20 mg by mouth daily.      . isosorbide mononitrate (IMDUR) 30 MG 24 hr tablet Take 1/2 tablet by mouth daily  15 tablet  3  . losartan (COZAAR) 50 MG tablet Take 50 mg by mouth daily.      . pantoprazole (PROTONIX) 40 MG tablet Take 1 tablet (40 mg total) by mouth daily.  30 tablet  11  . paricalcitol (ZEMPLAR) 1 MCG capsule Take 1 mcg by mouth daily.      . ondansetron (ZOFRAN ODT) 4 MG disintegrating tablet Take 1 tablet (4 mg total) by mouth every 8 (eight) hours as needed for nausea.  20 tablet  2   No current facility-administered medications for this visit.    No Known Allergies  Family History  Problem Relation Age of Onset  . Diabetes Father   . Heart attack Father   . Diabetes Sister   . Hyperlipidemia Sister   . Hypertension Sister   . Kidney disease Father   . Kidney disease Mother   . Kidney disease Sister     x2    History  Substance Use Topics  . Smoking status: Never Smoker   . Smokeless tobacco: Never Used  . Alcohol Use: No    ROS: As per history of present illness, otherwise negative  BP 108/76  Pulse 64  Ht 5\' 1"  (1.549 m)  Wt 217 lb (98.431 kg)  BMI 41.02 kg/m2 Constitutional: Well-developed and well-nourished. No distress. HEENT: Normocephalic and atraumatic. Oropharynx is clear and moist. No oropharyngeal exudate. Conjunctivae are normal.  No scleral icterus. Neck: Neck supple. Trachea midline. Cardiovascular: Normal rate, regular rhythm and intact distal pulses.  Pulmonary/chest: Effort normal and breath sounds normal. No wheezing, rales or rhonchi. Abdominal: Soft, obese, nontender, nondistended. Bowel sounds active throughout.  Extremities: no clubbing, cyanosis, or edema Neurological: Alert and oriented to person place and time. Skin: Skin is warm and dry. No rashes noted. Psychiatric: Normal mood and affect. Behavior is normal.  RELEVANT LABS AND IMAGING: Colonoscopy 05/25/2005, [facility]  -- normal colonoscopy to the terminal ileum. Quality of the preparation was excellent. Moderate internal hemorrhoids. Recommended repeat colonoscopy 10 years EGD 05/25/2005 [facility] gastroenterology -- small hiatus hernia, a few gastric polyps, normal examined duodenum. Bilious gastric fluid. It is possible the bile reflux and/or gastric dysmotility The patient's nausea, although it has gotten somewhat better since she tried stopping her iron. Pathology = stomach biopsy findings consistent with fundic gland polyp. Stomach biopsy, antrum reactive gastropathy. No H. pylori. Duodenal biopsy benign small bowel mucosa no active inflammation or villous atrophy identified.  Labs dated 06/25/2012 WBC 5.3, hemoglobin 11.4, MCV 90, platelet 244 AST 16, ALT 10, alkaline phosphatase is 126, total bilirubin 0.2 BUN 44, creatinine 3.35, GFR 17 Iron 56, TIBC 189, iron saturation 30%, ferritin 406  ASSESSMENT/PLAN: 57 year old female with a past medical history of stage IV chronic kidney disease, hypertension, GERD and arthritis who is seen in consultation at the request of Dr. Marval Regal to evaluate epigastric abdominal pain and nausea.  1.  Dyspepsia and nausea -- she has had trouble with dyspepsia and nausea in the past and was evaluated with upper endoscopy in 2007. Her symptoms are very similar at this time, and she has had a very good response to the addition of pantoprazole 40 mg daily. Most of her dyspepsia has resolved. She is still having nausea. The question of gastroparesis is also raised by her symptomatology. We discussed workup including repeat upper endoscopy, gastric imaging study, or a trial of an anti-emetic.  We will give her a trial of ondansetron 4 mg every 8 hours when necessary for nausea. I would like for her to continue pantoprazole 40 mg daily. I'll see her back in 6 weeks' time. She is no better we will consider repeating the upper endoscopy and ordering a gastric emptying study. She is happy  with this plan.  2.  Mild constipation -- I recommended a probiotic and she was given samples of Restora.  We will followup with this problem and if not better consider adding Colace or MiraLax.  3.  CRC screening -- she will be due repeat colonoscopy in May 2017 for screening  4.  Anemia -- iron studies consistent with chronic disease, likely related to her renal insufficiency.  Per Dr. Marval Regal

## 2012-08-26 ENCOUNTER — Ambulatory Visit: Payer: 59 | Admitting: Cardiology

## 2012-09-23 ENCOUNTER — Encounter: Payer: Self-pay | Admitting: Internal Medicine

## 2012-09-25 ENCOUNTER — Ambulatory Visit: Payer: 59 | Admitting: Internal Medicine

## 2012-10-25 ENCOUNTER — Encounter: Payer: Self-pay | Admitting: Cardiovascular Disease

## 2012-11-18 ENCOUNTER — Other Ambulatory Visit: Payer: Self-pay | Admitting: Cardiovascular Disease

## 2012-11-19 NOTE — Telephone Encounter (Signed)
Rx was sent to pharmacy electronically. 

## 2012-12-10 ENCOUNTER — Ambulatory Visit: Payer: 59 | Admitting: Cardiovascular Disease

## 2012-12-14 ENCOUNTER — Other Ambulatory Visit: Payer: Self-pay | Admitting: Internal Medicine

## 2012-12-27 ENCOUNTER — Encounter: Payer: Self-pay | Admitting: Cardiovascular Disease

## 2012-12-27 ENCOUNTER — Ambulatory Visit (INDEPENDENT_AMBULATORY_CARE_PROVIDER_SITE_OTHER): Payer: 59 | Admitting: Cardiovascular Disease

## 2012-12-27 VITALS — BP 136/78 | HR 85 | Ht 61.0 in | Wt 220.5 lb

## 2012-12-27 DIAGNOSIS — R079 Chest pain, unspecified: Secondary | ICD-10-CM

## 2012-12-27 DIAGNOSIS — I10 Essential (primary) hypertension: Secondary | ICD-10-CM

## 2012-12-27 NOTE — Patient Instructions (Signed)
Your physician wants you to follow-up in: 6 months with Dr Berry. You will receive a reminder letter in the mail two months in advance. If you don't receive a letter, please call our office to schedule the follow-up appointment.  

## 2012-12-27 NOTE — Assessment & Plan Note (Signed)
Controlled on current medications 

## 2012-12-27 NOTE — Assessment & Plan Note (Signed)
Since I saw her back 6 months ago she's had reduced frequency and severity of chest pain. She did have a negative Myoview stress test. I will see her back in 6 months

## 2012-12-27 NOTE — Progress Notes (Signed)
12/27/2012 Oleh Genin   04-03-1955  [id number]  Primary Physician Elyn Peers, MD Primary Cardiologist: Karen Harp MD Renae Gloss   HPI:  Karen [name] is a 57 year old married African American female mother of 2, grandmother to 2 grandchildren who currently works as an Sales executive. I saw her 6 years ago in our office for evaluation of a fast heart rate which was worked up and ultimately resolved. Her cardiac risk factor profile is notable for hypertension but otherwise are negative. For the last 3 weeks prior to her last office visit she had noticed substernal chest pain radiating to both upper extremities occurring on a daily basis. She does have reflux but she says the symptoms are different. I ordered a Myoview stress test which was entirely normal. I put her on a low-dose nitrate which resulted in mild improvement in her symptoms although she still complains of some chest pain since I saw her back her pain has decreased in frequency and severity. She did undergo vein mapping in anticipation of possible hemodialysis in the future.    Current Outpatient Prescriptions  Medication Sig Dispense Refill  . acetaminophen (TYLENOL) 500 MG tablet Take 500 mg by mouth as needed.       Marland Kitchen amLODipine (NORVASC) 10 MG tablet Take 10 mg by mouth daily.      . Fe Fum-FePoly-Vit C-Vit B3 (INTEGRA PO) Take by mouth daily.      . furosemide (LASIX) 20 MG tablet Take 20 mg by mouth daily.      . isosorbide mononitrate (IMDUR) 30 MG 24 hr tablet TAKE 1/2 TABLET BY MOUTH DAILY  15 tablet  6  . losartan (COZAAR) 50 MG tablet Take 50 mg by mouth daily.      . ondansetron (ZOFRAN-ODT) 4 MG disintegrating tablet TAKE 1 TABLET (4 MG TOTAL) BY MOUTH EVERY 8 (EIGHT) HOURS AS NEEDED FOR NAUSEA.  20 tablet  1  . pantoprazole (PROTONIX) 40 MG tablet Take 1 tablet (40 mg total) by mouth daily.  30 tablet  11  . paricalcitol (ZEMPLAR) 1 MCG capsule Take 1 mcg by mouth daily.       No  current facility-administered medications for this visit.    No Known Allergies  History   Social History  . Marital Status: Married    Spouse Name: N/A    Number of Children: 2  . Years of Education: N/A   Occupational History  . [employer]   Social History Main Topics  . Smoking status: Never Smoker   . Smokeless tobacco: Never Used  . Alcohol Use: No  . Drug Use: No  . Sexual Activity: Yes    Birth Control/ Protection: None     Comment: Hysterectomy   Other Topics Concern  . Not on file   Social History Narrative  . No narrative on file     Review of Systems: General: negative for chills, fever, night sweats or weight changes.  Cardiovascular: negative for chest pain, dyspnea on exertion, edema, orthopnea, palpitations, paroxysmal nocturnal dyspnea or shortness of breath Dermatological: negative for rash Respiratory: negative for cough or wheezing Urologic: negative for hematuria Abdominal: negative for nausea, vomiting, diarrhea, bright red blood per rectum, melena, or hematemesis Neurologic: negative for visual changes, syncope, or dizziness All other systems reviewed and are otherwise negative except as noted above.    Blood pressure 136/78, pulse 85, height 5\' 1"  (1.549 m), weight 220 lb 8 oz (100.018 kg).  General  appearance: alert and no distress Neck: no adenopathy, no carotid bruit, no JVD, supple, symmetrical, trachea midline and thyroid not enlarged, symmetric, no tenderness/mass/nodules Lungs: clear to auscultation bilaterally Heart: regular rate and rhythm, S1, S2 normal, no murmur, click, rub or gallop Extremities: extremities normal, atraumatic, no cyanosis or edema  EKG normal sinus rhythm 85 without ST or T wave changes  ASSESSMENT AND PLAN:   Chest pain Since I saw her back 6 months ago she's had reduced frequency and severity of chest pain. She did have a negative Myoview stress test. I will see her back in 6  months  HTN (hypertension) Controlled on current medications      Karen Harp MD [facility], [facility] 12/27/2012 4:53 PM

## 2013-04-02 ENCOUNTER — Other Ambulatory Visit (HOSPITAL_COMMUNITY): Payer: Self-pay | Admitting: *Deleted

## 2013-04-03 ENCOUNTER — Ambulatory Visit (HOSPITAL_COMMUNITY)
Admission: RE | Admit: 2013-04-03 | Discharge: 2013-04-03 | Disposition: A | Discharge status: Home / Self Care | Payer: 59 | Source: Ambulatory Visit | Attending: Nephrology | Admitting: Nephrology

## 2013-04-03 DIAGNOSIS — I120 Hypertensive chronic kidney disease with stage 5 chronic kidney disease or end stage renal disease: Secondary | ICD-10-CM | POA: Insufficient documentation

## 2013-04-03 DIAGNOSIS — N186 End stage renal disease: Secondary | ICD-10-CM | POA: Insufficient documentation

## 2013-04-03 MED ORDER — SODIUM CHLORIDE 0.9 % IV SOLN
1020.0000 mg | Freq: Once | INTRAVENOUS | Status: AC
  Administered 2013-04-03: 09:00:00 1020 mg via INTRAVENOUS
  Filled 2013-04-03: qty 34

## 2013-04-04 ENCOUNTER — Encounter (HOSPITAL_COMMUNITY): Payer: 59

## 2013-05-10 ENCOUNTER — Other Ambulatory Visit: Payer: Self-pay | Admitting: Internal Medicine

## 2013-05-20 ENCOUNTER — Encounter: Payer: Self-pay | Admitting: Internal Medicine

## 2013-05-26 ENCOUNTER — Ambulatory Visit: Payer: 59 | Admitting: Internal Medicine

## 2013-05-26 ENCOUNTER — Encounter: Payer: Self-pay | Admitting: Internal Medicine

## 2013-05-26 ENCOUNTER — Ambulatory Visit (INDEPENDENT_AMBULATORY_CARE_PROVIDER_SITE_OTHER): Payer: 59 | Admitting: Internal Medicine

## 2013-05-26 VITALS — BP 130/76 | HR 76 | Ht 61.0 in | Wt 220.6 lb

## 2013-05-26 DIAGNOSIS — K5900 Constipation, unspecified: Secondary | ICD-10-CM

## 2013-05-26 DIAGNOSIS — K5909 Other constipation: Secondary | ICD-10-CM

## 2013-05-26 DIAGNOSIS — R110 Nausea: Secondary | ICD-10-CM

## 2013-05-26 MED ORDER — ONDANSETRON 4 MG PO TBDP: 4.0000 mg | ORAL_TABLET | Freq: Three times a day (TID) | ORAL | Status: DC | PRN

## 2013-05-26 MED ORDER — POLYETHYLENE GLYCOL 3350 17 G PO PACK: 17.0000 g | PACK | Freq: Every day | ORAL | Status: DC

## 2013-05-26 NOTE — Progress Notes (Signed)
   Subjective:    Patient ID: Karen [name], female    DOB: 1955/12/24, 58 y.o.   MRN: [id number]  HPI Ms. Patch is a 58 year old female with a past medical history of stage IV chronic kidney disease, hypertension and GERD who seen in followup. She was initially seen to evaluate epigastric abdominal pain and nausea. She was treated with PPI and as needed Zofran. She returns today and overall is feeling well. She has recently had some issues with hyperkalemia related to her renal dysfunction and was treated once with Kayexalate. She has followup labs plan today with her nephrologist. Overall she reports her nausea is slightly better though she still having nausea during eating and sometimes afterwards. She is not vomiting. Zofran has helped with her nausea considerably. She is not having abdominal pain or bloating. She does not have heartburn or esophageal complaint. Hepatobiliary complaint. No fevers or chills. She does report constipation and has a bowel movement on average once per week. This has been average for her lately and she denies blood in her stool or melena. Other than occasional bloating she does not complain of constipation discomfort.   Review of Systems As per history of present illness, otherwise neg  Current Medications, Allergies, Past Medical History, Past Surgical History, Family History and Social History were reviewed in [employer] record.     Objective:   Physical Exam BP 130/76  Pulse 76  Ht 5\' 1"  (1.549 m)  Wt 220 lb 9.6 oz (100.064 kg)  BMI 41.70 kg/m2 Constitutional: Well-developed and well-nourished. No distress. HEENT: Normocephalic and atraumatic. Oropharynx is clear and moist. No oropharyngeal exudate. Conjunctivae are normal.  No scleral icterus. Cardiovascular: Normal rate, regular rhythm and intact distal pulses. No M/R/G Pulmonary/chest: Effort normal and breath sounds normal. No wheezing, rales or rhonchi. Abdominal: Soft,  nontender, nondistended. Bowel sounds active throughout.  Extremities: no clubbing, cyanosis, with trace pretibial edema Lymphadenopathy: No cervical adenopathy noted. Neurological: Alert and oriented to person place and time. Skin: Skin is warm and dry. No rashes noted. Psychiatric: Normal mood and affect. Behavior is normal.  Previous EGD/colon reviewed (Dr. Cristina Gong)     Assessment & Plan:  58 year old female with a past medical history of stage IV chronic kidney disease, hypertension and GERD who seen in followup.  1.  Nausea -- her nausea is somewhat better though still present on occasion when she. Zofran has helped. She's had previous upper endoscopy which revealed fundic gland polyps and mild gastritis without H. pylori. I wonder if her nausea could be related to constipation and with this in mind we will treat her constipation. I have asked her to call me in one month to update me on her symptoms. If nausea persists I recommend repeating upper endoscopy. Continue as needed Zofran.  She understands and is agreeable to this plan  2.  Constipation -- see #1. MiraLax 17 g daily. If her stools are too loose she can take this every other day but I would like her to take this on a scheduled basis to provide benefit. If constipation persists can try a prescription laxative such as Linzess  3.  CRC screening -- up to date repeat screening colonoscopy due May 2017

## 2013-05-26 NOTE — Patient Instructions (Signed)
We have sent the following medications to your pharmacy for you to pick up at your convenience: Miralax 17 g daily and zofran  Call us in 1 month to let us know if having more bowel movements has helped with your nausea. If not Dr. Hilarie Fredrickson recommends and Endoscopy                                               We are excited to introduce MyChart, a new best-in-class service that provides you online access to important information in your electronic medical record. We want to make it easier for you to view your health information - all in one secure location - when and where you need it. We expect MyChart will enhance the quality of care and service we provide.  When you register for MyChart, you can:    View your test results.    Request appointments and receive appointment reminders via email.    Request medication renewals.    View your medical history, allergies, medications and immunizations.    Communicate with your physician's office through a password-protected site.    Conveniently print information such as your medication lists.  To find out if MyChart is right for you, please talk to a member of our clinical staff today. We will gladly answer your questions about this free health and wellness tool.  If you are age 58 or older and want a member of your family to have access to your record, you must provide written consent by completing a proxy form available at our office. Please speak to our clinical staff about guidelines regarding accounts for patients younger than age 58.  As you activate your MyChart account and need any technical assistance, please call the MyChart technical support line at [phone]) 83-CHART [phone]) or email your question to [email or web address]. If you email your question(s), please include your name, a return phone number and the best time to reach you.  If you have non-urgent health-related questions, you can send a message to our office through  [place] at [email or web address]. If you have a medical emergency, call 911.  Thank you for using MyChart as your new health and wellness resource!   MyChart licensed from [employer],  1999-2010. Patents Pending.

## 2013-05-29 ENCOUNTER — Emergency Department (INDEPENDENT_AMBULATORY_CARE_PROVIDER_SITE_OTHER): Payer: 59

## 2013-05-29 ENCOUNTER — Encounter (HOSPITAL_COMMUNITY): Payer: Self-pay | Admitting: Emergency Medicine

## 2013-05-29 ENCOUNTER — Emergency Department (HOSPITAL_COMMUNITY)
Admission: EM | Admit: 2013-05-29 | Discharge: 2013-05-29 | Disposition: A | Discharge status: Home / Self Care | Payer: 59 | Source: Home / Self Care | Attending: Family Medicine | Admitting: Family Medicine

## 2013-05-29 DIAGNOSIS — M353 Polymyalgia rheumatica: Secondary | ICD-10-CM

## 2013-05-29 LAB — POCT I-STAT, CHEM 8
BUN: 56 mg/dL — ABNORMAL HIGH (ref 6–23)
Calcium, Ion: 1.24 mmol/L — ABNORMAL HIGH (ref 1.12–1.23)
Chloride: 111 mEq/L (ref 96–112)
Creatinine, Ser: 4.8 mg/dL — ABNORMAL HIGH (ref 0.50–1.10)
Glucose, Bld: 80 mg/dL (ref 70–99)
HCT: 33 % — ABNORMAL LOW (ref 36.0–46.0)
HEMOGLOBIN: 11.2 g/dL — AB (ref 12.0–15.0)
POTASSIUM: 4.6 meq/L (ref 3.7–5.3)
Sodium: 144 mEq/L (ref 137–147)
TCO2: 21 mmol/L (ref 0–100)

## 2013-05-29 LAB — POCT URINALYSIS DIP (DEVICE)
Bilirubin Urine: NEGATIVE
GLUCOSE, UA: NEGATIVE mg/dL
Ketones, ur: NEGATIVE mg/dL
LEUKOCYTES UA: NEGATIVE
NITRITE: NEGATIVE
Protein, ur: 100 mg/dL — AB
SPECIFIC GRAVITY, URINE: 1.015 (ref 1.005–1.030)
UROBILINOGEN UA: 0.2 mg/dL (ref 0.0–1.0)
pH: 6.5 (ref 5.0–8.0)

## 2013-05-29 MED ORDER — ASPIRIN 81 MG PO CHEW: 81.0000 mg | CHEWABLE_TABLET | Freq: Every day | ORAL | Status: DC

## 2013-05-29 MED ORDER — PREDNISONE 10 MG PO TABS: 10.0000 mg | ORAL_TABLET | Freq: Every day | ORAL | Status: DC

## 2013-05-29 NOTE — ED Notes (Signed)
Patient cannot void at this time 

## 2013-05-29 NOTE — Discharge Instructions (Signed)
Please begin taking medication as prescribed and contact either your primary care doctor or your nephrologist (Dr. Marval Regal) to discuss long term care and management.   Polymyalgia Rheumatica Polymyalgia rheumatica (also called PMR or polymyalgia) is a rheumatologic (arthritic) condition that causes pain and morning stiffness in your neck, shoulders, and hips. It is an inflammatory condition. In some people, inflammation of certain structures in the shoulder, hips, or other joints can be seen on special testing. It does not cause joint destruction, as occurs in other arthritic conditions. It usually occurs after 59 years of age, and is more common as you age. It can be confused with several other diseases, but it is usually easily treated. People with PMR often have, or can develop, a more severe rheumatologic condition called giant cell arteritis (also called CGA or temporal arteritis).  CAUSES  The exact cause of PMR is not known.   There are genetic factors involved.  Viruses have been suspected in the cause of PMR. This has not been proven. SYMPTOMS   Aching, pain, and morning stiffness your neck, both shoulders, or both hips.  Symptoms usually start slowly and build gradually.  Morning stiffness usually lasts at least 30 minutes.  Swelling and tenderness in other joints of the arms, hands, legs, and feet may occur.  Swelling and inflammation in the wrists can cause nerve inflammation at the wrist (carpal tunnel syndrome).  You may also have low grade fever, fatigue, weakness, decreased appetite and weight loss. DIAGNOSIS   Your caregiver may suspect that you have PMR based on your description of your symptoms and on your exam.  Your caregiver will examine you to be sure you do not have diseases that can be confused with PMR. These diseases include rheumatoid arthritis, fibromyalgia, or thyroid disease.  Your caregiver should check for signs of giant cell arteritis. This can cause  serious complications such as blindness.  Lab tests can help confirm that you have PMR and not other diseases, but are sometimes inconclusive.  X-rays cannot show PMR. However, it can identify other diseases like rheumatoid arthritis. Your caregiver may have you see a specialist in arthritis and inflammatory diseases (rheumatologist). TREATMENT  The goal of treatment is relief of symptoms. Treatment does not shorten the course of the illness or prevent complications. With proper treatment, you usually feel better almost right away.   The initial treatment of PMR is usually a cortisone (steroid) medication. Your caregiver will help determine a starting dose. The dose is gradually reduced every few weeks to months. Treatment usually lasts one to three years.  Other stronger medications are rarely needed. They will only be prescribed if your symptoms do not get better on cortisone medication alone, or if they recur as the dose is reduced.  Cortisone medication can have different side effects. With the doses of cortisone needed for PMR, the side effects can affect bones and joints, blood sugar control in diabetes, and mood changes. Discuss this with your caregiver.  Your caregiver will evaluate you regularly during your treatment. They will do this in order to assess progress and to check for complications of the illness or treatment.  Physical therapy is sometimes useful. This is especially true if your joints are still stiff after other symptoms have improved. HOME CARE INSTRUCTIONS   Follow your caregiver's instructions. Do not change your dose of cortisone medication on your own.  Keep your appointments for follow-up lab tests and caregiver visits. Your lab tests need to be monitored. You  must get checked periodically for giant cell arteritis.  Follow your caregiver's guidance regarding physical activity (usually no restrictions are needed) or physical therapy.  Your caregiver may have  instructions to prevent or check for side effects from cortisone medication (including bone density testing or treatment). Follow their instructions carefully. SEEK MEDICAL CARE IF:   You develop any side effects from treatment. Side effects can include:  Elevated blood pressure.  High blood sugar (or worsening of diabetes, if you are diabetic).  Difficulty fighting off infections.  Weight gain.  Weakness of the bones (osteoporosis).  Your aches, pains, morning stiffness, or other symptoms get worse with time. This is especially true after your dose of cortisone is reduced.  You develop new joint symptoms (pain, swelling, etc.) SEEK IMMEDIATE MEDICAL CARE IF:   You develop a severe headache.  You start vomiting.  You have problems with your vision.  You have an oral temperature above 102 F (38.9 C), not controlled by medicine. Document Released: 02/10/2004 Document Revised: 12/20/2011 Document Reviewed: 05/25/2008 [facility] Patient Information 2014 [place], [place].

## 2013-05-29 NOTE — ED Provider Notes (Signed)
CSN: [id number]     Arrival date & time 05/29/13  1211 History   First MD Initiated Contact with Patient 05/29/13 1348     Chief Complaint  Patient presents with  . Generalized Body Aches   (Consider location/radiation/quality/duration/timing/severity/associated sxs/prior Treatment) HPI Comments: Patient reports 2 week history of "stiffness" and "soreness" across the tops of both of her shoulders and bilateral lateral neck with associated profound end of day fatigue. Also mentions swelling of her ankles and tops of her feet over last one week. States that by the end of the day over the past 1-2 weeks, she is "completely out of gas" and "worn out." Using tylenol at home with limited relief.   The history is provided by the patient.    Past Medical History  Diagnosis Date  . Chronic kidney disease     trying to get on kidney [facility] list [facility]  . Hypertension   . Chest pain   . Arthritis   . GERD (gastroesophageal reflux disease)    Past Surgical History  Procedure Laterality Date  . Rotator cuff repair Right 1997  . Cholecystectomy  2005  . Abdominal hysterectomy    . Knee arthroscopy Bilateral   . Bunionectomy Bilateral    Family History  Problem Relation Age of Onset  . Diabetes Sister   . Heart attack Father   . Hyperlipidemia Sister   . Hypertension Sister   . Kidney disease Father   . Kidney disease Mother   . Kidney disease Sister     x2   History  Substance Use Topics  . Smoking status: Never Smoker   . Smokeless tobacco: Never Used  . Alcohol Use: No   OB History   Grav Para Term Preterm Abortions TAB SAB Ect Mult Living                 Review of Systems  Constitutional: Positive for fatigue. Negative for fever, chills, diaphoresis, activity change, appetite change and unexpected weight change.  HENT: Negative.   Eyes: Negative.  Negative for visual disturbance.  Respiratory: Negative.   Cardiovascular: Positive for leg swelling. Negative  for chest pain and palpitations.  Gastrointestinal: Negative.   Endocrine: Negative for cold intolerance, heat intolerance, polydipsia, polyphagia and polyuria.  Genitourinary: Negative.   Musculoskeletal: Positive for myalgias and neck stiffness. Negative for arthralgias, back pain, gait problem, joint swelling and neck pain.       See HPI  Skin: Negative.   Allergic/Immunologic: Negative for immunocompromised state.  Neurological: Negative for dizziness, syncope, weakness, light-headedness and headaches.    Allergies  Review of patient's allergies indicates no known allergies.  Home Medications   Prior to Admission medications   Medication Sig Start Date End Date Taking? Authorizing Provider  acetaminophen (TYLENOL) 500 MG tablet Take 500 mg by mouth as needed.     Historical Provider, MD  amLODipine (NORVASC) 10 MG tablet Take 10 mg by mouth daily.    Historical Provider, MD  Fe Fum-FePoly-Vit C-Vit B3 (INTEGRA PO) Take by mouth daily.    Historical Provider, MD  furosemide (LASIX) 20 MG tablet Take 20 mg by mouth daily.    Historical Provider, MD  isosorbide mononitrate (IMDUR) 30 MG 24 hr tablet TAKE 1/2 TABLET BY MOUTH DAILY 11/18/12   Lorretta Harp, MD  ondansetron (ZOFRAN-ODT) 4 MG disintegrating tablet Take 1 tablet (4 mg total) by mouth every 8 (eight) hours as needed for nausea or vomiting. 05/26/13   Lajuan Lines  Pyrtle, MD  pantoprazole (PROTONIX) 40 MG tablet Take 1 tablet (40 mg total) by mouth daily. 06/12/12   Lorretta Harp, MD  paricalcitol (ZEMPLAR) 1 MCG capsule Take 1 mcg by mouth daily.    Historical Provider, MD  polyethylene glycol (MIRALAX / GLYCOLAX) packet Take 17 g by mouth daily. 05/26/13   Jerene Bears, MD   BP 146/99  Pulse 73  Temp(Src) 97 F (36.1 C) (Oral)  Resp 16  SpO2 97% Physical Exam  Nursing note and vitals reviewed. Constitutional: She is oriented to person, place, and time. She appears well-developed and well-nourished. No distress.  HENT:   Head: Normocephalic and atraumatic.  Mouth/Throat: Oropharynx is clear and moist.  Eyes: Conjunctivae, EOM and lids are normal. Pupils are equal, round, and reactive to light. No scleral icterus.  Neck: Normal range of motion. Neck supple. No JVD present. No thyromegaly present.  Cardiovascular: Normal rate, regular rhythm and normal heart sounds.   Pulmonary/Chest: Effort normal and breath sounds normal. No stridor. No respiratory distress. She has no wheezes.  Abdominal: Soft. Normal appearance and bowel sounds are normal. There is no tenderness.  Musculoskeletal: Normal range of motion.       Right shoulder: Normal.       Left shoulder: Normal.       Cervical back: Normal.  CSM exam of bilateral upper extremities normal.   Lymphadenopathy:    She has no cervical adenopathy.  Neurological: She is alert and oriented to person, place, and time.  Skin: Skin is warm and dry.  Psychiatric: She has a normal mood and affect. Her behavior is normal.    ED Course  Procedures (including critical care time) Labs Review Labs Reviewed  POCT I-STAT, CHEM 8 - Abnormal; Notable for the following:    BUN 56 (*)    Creatinine, Ser 4.80 (*)    Calcium, Ion 1.24 (*)    Hemoglobin 11.2 (*)    HCT 33.0 (*)    All other components within normal limits  POCT URINALYSIS DIP (DEVICE) - Abnormal; Notable for the following:    Hgb urine dipstick SMALL (*)    Protein, ur 100 (*)    All other components within normal limits    Imaging Review Dg Chest 2 View  05/29/2013   CLINICAL DATA:  Chest pain.  EXAM: CHEST  2 VIEW  COMPARISON:  None.  FINDINGS: The heart size and mediastinal contours are within normal limits. Both lungs are clear. No pneumothorax or pleural effusion is noted. The visualized skeletal structures are unremarkable.  IMPRESSION: No acute cardiopulmonary abnormality seen.   Electronically Signed   By: Sabino Dick M.D.   On: 05/29/2013 14:52     MDM   1. Polymyalgia rheumatica  syndrome   Hx and exam suggest possible polymyalgia rheumatica without associated temporal arteritis. In addition informed patient that while she has known CRI, her creatinine is 4.80 today. H/H with mild anemia (11/33). CXR unremarkable. UA grossly normal for patient with CRI. ECG NSR @ 68 bpm and without ST/T wave changes or ectopy. Suggested to patient that we begin her on low dose prednisone and low dose aspirin (10mg  po QD) and that she follow up with both her PCP and nephrologist in the next 5-7 days to determine if additional testing needs and plan for long term management.    [place], [name] 05/29/13 [phone]

## 2013-05-29 NOTE — ED Notes (Signed)
Pt  Reports    Pain in  Neck    And  extremitys     With  Some  Swelling         Of  Legs  Feet      Pt  Also  Reports  Some  Pain in  Shoulders  As  Well      Pt  Has  Symptoms  Of  Chronic        Kidney  Disease

## 2013-05-29 NOTE — ED Notes (Signed)
Pt had urinated prior to staff asking for sample . Providing water to pt.

## 2013-06-01 NOTE — ED Provider Notes (Signed)
Medical screening examination/treatment/procedure(s) were performed by a resident physician or non-physician practitioner and as the supervising physician I was immediately available for consultation/collaboration.  Lynne Leader, MD    Gregor Hams, MD 06/01/13 [phone]

## 2013-06-25 ENCOUNTER — Encounter: Payer: Self-pay | Admitting: Cardiovascular Disease

## 2013-06-25 ENCOUNTER — Ambulatory Visit (INDEPENDENT_AMBULATORY_CARE_PROVIDER_SITE_OTHER): Payer: 59 | Admitting: Cardiovascular Disease

## 2013-06-25 VITALS — BP 132/78 | HR 90 | Ht 61.0 in | Wt 223.0 lb

## 2013-06-25 DIAGNOSIS — R079 Chest pain, unspecified: Secondary | ICD-10-CM

## 2013-06-25 DIAGNOSIS — I10 Essential (primary) hypertension: Secondary | ICD-10-CM

## 2013-06-25 MED ORDER — ISOSORBIDE MONONITRATE ER 30 MG PO TB24: 30.0000 mg | ORAL_TABLET | Freq: Every day | ORAL | Status: DC

## 2013-06-25 NOTE — Progress Notes (Signed)
06/25/2013 Oleh Genin   1955-11-27  ST:[id number]  Primary Physician Elyn Peers, MD Primary Cardiologist: Lorretta Harp MD Renae Gloss    HPI:  Karen [name] is a 58 year old married African American female mother of 2, grandmother to 2 grandchildren who currently works as an Sales executive. I saw her 6 years ago in our office for evaluation of a fast heart rate which was worked up and ultimately resolved. Her cardiac risk factor profile is notable for hypertension but otherwise are negative. For the last 3 weeks prior to her last office visit she had noticed substernal chest pain radiating to both upper extremities occurring on a daily basis. She does have reflux but she says the symptoms are different. I ordered a Myoview stress test which was entirely normal. I put her on a low-dose nitrate which resulted in mild improvement in her symptoms although she still complains of some chest pain since I saw her back her pain has decreased in frequency and severity. She did undergo vein mapping in anticipation of possible hemodialysis in the future.I saw her 6 months ago. The pain has remained stable in frequency and severity. It is somewhat positional and worse when she is recumbent.   Current Outpatient Prescriptions  Medication Sig Dispense Refill  . acetaminophen (TYLENOL) 500 MG tablet Take 500 mg by mouth as needed.       Marland Kitchen amLODipine (NORVASC) 10 MG tablet Take 10 mg by mouth daily.      . Fe Fum-FePoly-Vit C-Vit B3 (INTEGRA PO) Take by mouth daily.      . furosemide (LASIX) 20 MG tablet Take 20 mg by mouth daily.      . isosorbide mononitrate (IMDUR) 30 MG 24 hr tablet take 1 tablets daily      . ondansetron (ZOFRAN-ODT) 4 MG disintegrating tablet Take 1 tablet (4 mg total) by mouth every 8 (eight) hours as needed for nausea or vomiting.  20 tablet  1  . pantoprazole (PROTONIX) 40 MG tablet Take 1 tablet (40 mg total) by mouth daily.  30 tablet  11  . paricalcitol  (ZEMPLAR) 1 MCG capsule Take 1 mcg by mouth daily.      . polyethylene glycol (MIRALAX / GLYCOLAX) packet Take 17 g by mouth daily.  14 each  0   No current facility-administered medications for this visit.    No Known Allergies  History   Social History  . Marital Status: Married    Spouse Name: N/A    Number of Children: 2  . Years of Education: N/A   Occupational History  . [employer]   Social History Main Topics  . Smoking status: Never Smoker   . Smokeless tobacco: Never Used  . Alcohol Use: No  . Drug Use: No  . Sexual Activity: Yes    Birth Control/ Protection: None     Comment: Hysterectomy   Other Topics Concern  . Not on file   Social History Narrative  . No narrative on file     Review of Systems: General: negative for chills, fever, night sweats or weight changes.  Cardiovascular: negative for chest pain, dyspnea on exertion, edema, orthopnea, palpitations, paroxysmal nocturnal dyspnea or shortness of breath Dermatological: negative for rash Respiratory: negative for cough or wheezing Urologic: negative for hematuria Abdominal: negative for nausea, vomiting, diarrhea, bright red blood per rectum, melena, or hematemesis Neurologic: negative for visual changes, syncope, or dizziness All other systems reviewed and are otherwise negative except  as noted above.    Blood pressure 132/78, pulse 90, height 5\' 1"  (1.549 m), weight 223 lb (101.152 kg).  General appearance: alert and no distress Neck: no adenopathy, no carotid bruit, no JVD, supple, symmetrical, trachea midline and thyroid not enlarged, symmetric, no tenderness/mass/nodules Lungs: clear to auscultation bilaterally Heart: regular rate and rhythm, S1, S2 normal, no murmur, click, rub or gallop Extremities: extremities normal, atraumatic, no cyanosis or edema  EKG sinus rhythm at 90 without ST or T wave changes  ASSESSMENT AND PLAN:   HTN (hypertension) Well-controlled on  current medications  Chest pain Patient continues to have atypical chest pain occurring several times a week lasting for minutes at a time. It is right of her sternum and worse with lying recumbent. I suspect this is more related to reflux but she did have a mildly improved response with loaded dose nitrates. She had a negative Myoview stress test. I'm going to increase her Imdur from 15-30 mg a day. At this point I am reluctant to confirm or rule out CAD by invasive means because of her chronic renal insufficiency.      Lorretta Harp MD FACP,FACC,FAHA, [facility] 06/25/2013 10:13 AM

## 2013-06-25 NOTE — Assessment & Plan Note (Signed)
Well-controlled on current medications 

## 2013-06-25 NOTE — Patient Instructions (Signed)
Your physician recommends that you schedule a follow-up appointment in: 6 Months with PA and 12 Months with Dr Gwenlyn Found  Your physician has recommended you make the following change in your medication: Increase Isosorbide to 30 mg daily

## 2013-06-25 NOTE — Assessment & Plan Note (Signed)
Patient continues to have atypical chest pain occurring several times a week lasting for minutes at a time. It is right of her sternum and worse with lying recumbent. I suspect this is more related to reflux but she did have a mildly improved response with loaded dose nitrates. She had a negative Myoview stress test. I'm going to increase her Imdur from 15-30 mg a day. At this point I am reluctant to confirm or rule out CAD by invasive means because of her chronic renal insufficiency.

## 2013-07-01 ENCOUNTER — Other Ambulatory Visit: Payer: Self-pay | Admitting: *Deleted

## 2013-07-01 MED ORDER — PANTOPRAZOLE SODIUM 40 MG PO TBEC: 40.0000 mg | DELAYED_RELEASE_TABLET | Freq: Every day | ORAL | Status: DC

## 2013-07-01 NOTE — Telephone Encounter (Signed)
Rx refill sent to patient pharmacy   

## 2013-07-31 ENCOUNTER — Other Ambulatory Visit: Payer: Self-pay | Admitting: Internal Medicine

## 2013-09-08 ENCOUNTER — Other Ambulatory Visit: Payer: Self-pay | Admitting: Urology

## 2013-09-08 DIAGNOSIS — N281 Cyst of kidney, acquired: Secondary | ICD-10-CM

## 2013-09-16 ENCOUNTER — Ambulatory Visit (HOSPITAL_COMMUNITY)
Admission: RE | Admit: 2013-09-16 | Discharge: 2013-09-16 | Disposition: A | Discharge status: Home / Self Care | Payer: 59 | Source: Ambulatory Visit | Attending: Urology | Admitting: Urology

## 2013-09-16 DIAGNOSIS — D4120 Neoplasm of uncertain behavior of unspecified ureter: Secondary | ICD-10-CM | POA: Diagnosis not present

## 2013-09-16 DIAGNOSIS — Q618 Other cystic kidney diseases: Secondary | ICD-10-CM | POA: Insufficient documentation

## 2013-09-16 DIAGNOSIS — D4100 Neoplasm of uncertain behavior of unspecified kidney: Secondary | ICD-10-CM | POA: Insufficient documentation

## 2013-09-16 DIAGNOSIS — N281 Cyst of kidney, acquired: Secondary | ICD-10-CM

## 2013-10-15 ENCOUNTER — Other Ambulatory Visit (HOSPITAL_COMMUNITY): Payer: Self-pay | Admitting: Urology

## 2013-10-15 DIAGNOSIS — N289 Disorder of kidney and ureter, unspecified: Secondary | ICD-10-CM

## 2013-10-16 ENCOUNTER — Other Ambulatory Visit: Payer: Self-pay | Admitting: Radiology

## 2013-10-20 ENCOUNTER — Encounter (HOSPITAL_COMMUNITY): Payer: Self-pay | Admitting: Pharmacy Technician

## 2013-10-21 ENCOUNTER — Telehealth: Payer: Self-pay | Admitting: Cardiovascular Disease

## 2013-10-21 NOTE — Telephone Encounter (Signed)
Closed encounter °

## 2013-10-22 ENCOUNTER — Ambulatory Visit (HOSPITAL_COMMUNITY)
Admission: RE | Admit: 2013-10-22 | Discharge: 2013-10-22 | Disposition: A | Discharge status: Home / Self Care | Payer: 59 | Source: Ambulatory Visit | Attending: Urology | Admitting: Urology

## 2013-10-22 ENCOUNTER — Encounter (HOSPITAL_COMMUNITY): Payer: Self-pay

## 2013-10-22 DIAGNOSIS — N289 Disorder of kidney and ureter, unspecified: Secondary | ICD-10-CM

## 2013-10-22 LAB — CBC
HEMATOCRIT: 31.6 % — AB (ref 36.0–46.0)
Hemoglobin: 10.4 g/dL — ABNORMAL LOW (ref 12.0–15.0)
MCH: 30 pg (ref 26.0–34.0)
MCHC: 32.9 g/dL (ref 30.0–36.0)
MCV: 91.1 fL (ref 78.0–100.0)
PLATELETS: 229 10*3/uL (ref 150–400)
RBC: 3.47 MIL/uL — ABNORMAL LOW (ref 3.87–5.11)
RDW: 13.8 % (ref 11.5–15.5)
WBC: 4 10*3/uL (ref 4.0–10.5)

## 2013-10-22 LAB — PROTIME-INR
INR: 1.09 (ref 0.00–1.49)
PROTHROMBIN TIME: 14.1 s (ref 11.6–15.2)

## 2013-10-22 LAB — APTT: APTT: 36 s (ref 24–37)

## 2013-10-22 MED ORDER — LIDOCAINE HCL 1 % IJ SOLN
INTRAMUSCULAR | Status: AC
  Filled 2013-10-22: qty 20

## 2013-10-22 MED ORDER — FENTANYL CITRATE 0.05 MG/ML IJ SOLN
INTRAMUSCULAR | Status: AC | PRN
  Administered 2013-10-22 (×3): 25 ug via INTRAVENOUS

## 2013-10-22 MED ORDER — SODIUM CHLORIDE 0.9 % IV SOLN
INTRAVENOUS | Status: DC
  Administered 2013-10-22: 10:00:00 via INTRAVENOUS

## 2013-10-22 MED ORDER — MIDAZOLAM HCL 2 MG/2ML IJ SOLN
INTRAMUSCULAR | Status: AC | PRN
  Administered 2013-10-22 (×2): 1 mg via INTRAVENOUS

## 2013-10-22 MED ORDER — MIDAZOLAM HCL 2 MG/2ML IJ SOLN
INTRAMUSCULAR | Status: AC
Start: 2013-10-22 — End: 2013-10-22
  Filled 2013-10-22: qty 2

## 2013-10-22 MED ORDER — FENTANYL CITRATE 0.05 MG/ML IJ SOLN
INTRAMUSCULAR | Status: AC
  Filled 2013-10-22: qty 2

## 2013-10-22 NOTE — H&P (Signed)
Chief Complaint: "I am here for a right kidney biopsy."  Referring Physician(s): Nesi,Marc H  History of Present Illness: Karen [name] is a 58 y.o. female with CKD not on HD, trying to get on renal transplant list. Work up for transplant included MRI on 09/16/13 revealing multiple renal cysts and a nodule on right kidney. The patient is unable to receive contrast media for further evaluation and was scheduled today for image guided right renal biopsy with moderate sedation. She denies any chest pain, shortness of breath or palpitations. She denies any active signs of bleeding or excessive bruising. She denies any recent fever or chills. The patient denies any history of sleep apnea or chronic oxygen use. She has previously tolerated sedation without complications during a colonoscopy.    Past Medical History  Diagnosis Date  . Chronic kidney disease     trying to get on kidney transplant list at [facility]  . Hypertension   . Chest pain   . Arthritis   . GERD (gastroesophageal reflux disease)     Past Surgical History  Procedure Laterality Date  . Rotator cuff repair Right 1997  . Cholecystectomy  2005  . Abdominal hysterectomy    . Knee arthroscopy Bilateral   . Bunionectomy Bilateral     Allergies: Review of patient's allergies indicates no known allergies.  Medications: Prior to Admission medications   Medication Sig Start Date End Date Taking? Authorizing Provider  acetaminophen (TYLENOL) 500 MG tablet Take 500 mg by mouth every 6 (six) hours as needed for mild pain, moderate pain or headache.    Yes Historical Provider, MD  amLODipine (NORVASC) 10 MG tablet Take 10 mg by mouth daily.   Yes Historical Provider, MD  Fe Fum-FePoly-Vit C-Vit B3 (INTEGRA PO) Take 1 tablet by mouth daily.    Yes Historical Provider, MD  furosemide (LASIX) 20 MG tablet Take 20 mg by mouth daily.   Yes Historical Provider, MD  isosorbide mononitrate (IMDUR) 30 MG 24 hr tablet Take 1 tablet  (30 mg total) by mouth daily. 06/25/13  Yes Lorretta Harp, MD  ondansetron (ZOFRAN-ODT) 4 MG disintegrating tablet TAKE 1 TABLET BY MOUTH EVERY 8 HOURS AS NEEDED FOR NAUSEA OR VOMITING.   Yes Jerene Bears, MD  pantoprazole (PROTONIX) 40 MG tablet Take 1 tablet (40 mg total) by mouth daily. 07/01/13  Yes Lorretta Harp, MD  paricalcitol (ZEMPLAR) 1 MCG capsule Take 1 mcg by mouth daily.   Yes Historical Provider, MD  polyethylene glycol (MIRALAX / GLYCOLAX) packet Take 17 g by mouth daily as needed for mild constipation.    Historical Provider, MD    Family History  Problem Relation Age of Onset  . Diabetes Sister   . Heart attack Father   . Hyperlipidemia Sister   . Hypertension Sister   . Kidney disease Father   . Kidney disease Mother   . Kidney disease Sister     x2    History   Social History  . Marital Status: Married    Spouse Name: N/A    Number of Children: 2  . Years of Education: N/A   Occupational History  . [employer]   Social History Main Topics  . Smoking status: Never Smoker   . Smokeless tobacco: Never Used  . Alcohol Use: No  . Drug Use: No  . Sexual Activity: Yes    Birth Control/ Protection: None     Comment: Hysterectomy   Other Topics  Concern  . None   Social History Narrative  . None   Review of Systems: A 12 point ROS discussed and pertinent positives are indicated in the HPI above.  All other systems are negative.  Review of Systems  Vital Signs: BP 148/76  Pulse 78  Temp(Src) 97.9 F (36.6 C) (Oral)  Resp 18  Ht 5\' 1"  (1.549 m)  Wt 211 lb (95.709 kg)  BMI 39.89 kg/m2  SpO2 100%  Physical Exam  Constitutional: She appears well-developed and well-nourished. No distress.  HENT:  Head: Normocephalic and atraumatic.  Neck: No tracheal deviation present.  Cardiovascular: Normal rate and regular rhythm.  Exam reveals no gallop and no friction rub.   No murmur heard. Pulmonary/Chest: Effort normal and breath  sounds normal. No respiratory distress. She has no wheezes. She has no rales.  Abdominal: Soft. Bowel sounds are normal. She exhibits no distension. There is no tenderness.  Skin: She is not diaphoretic.    Imaging: No results found.  Labs:  CBC:  Recent Labs  05/29/13 1427  HGB 11.2*  HCT 33.0*    COAGS: No results found for this basename: INR, APTT,  in the last 8760 hours  BMP:  Recent Labs  05/29/13 1427  NA 144  K 4.6  CL 111  GLUCOSE 80  BUN 56*  CREATININE 4.80*    LIVER FUNCTION TESTS: No results found for this basename: BILITOT, AST, ALT, ALKPHOS, PROT, ALBUMIN,  in the last 8760 hours  TUMOR MARKERS: No results found for this basename: AFPTM, CEA, CA199, CHROMGRNA,  in the last 8760 hours  Assessment and Plan: CKD, trying to get on transplant list MRI 09/16/13 with multiple cysts and nodule on right kidney, unable to receive contrast media for further evaluation.  Scheduled today for image guided right renal biopsy with moderate sedation Patient has been NPO, no blood thinners, labs reviewed, BP 148/76 mmHg Risks and Benefits discussed with the patient. All of the patient's questions were answered, patient is agreeable to proceed. Consent signed and in chart.     SignedHedy Jacob 10/22/2013, 10:34 AM

## 2013-10-22 NOTE — Sedation Documentation (Addendum)
Biopsy performed per MD. 77ml aspirated.

## 2013-10-22 NOTE — H&P (Signed)
Agree.  Patient seen and examined.  Risks of bleeding, infection and pneumothorax discussed with patient and her family.  For CT guided aspiration/biopsy of cystic lesion of right upper kidney.  Patient agreeable to proceed.

## 2013-10-22 NOTE — Sedation Documentation (Signed)
MD at bedside. 

## 2013-10-22 NOTE — Sedation Documentation (Signed)
Patient is resting comfortably. 

## 2013-10-22 NOTE — Discharge Instructions (Signed)
Kidney Biopsy A biopsy is a test that involves collecting small pieces of tissue, usually with a needle. The tissue is then examined under a microscope. A kidney biopsy can help a health care provider make a diagnosis and determine the best course of treatment. Your health care provider may recommend a kidney biopsy if you have any of the following conditions:  Blood in your urine (hematuria).  Excessive protein in your urine (proteinuria).  Impaired kidney function that causes excessive waste products in your blood. A specialist will look at the kidney tissue samples to check for unusual deposits, scarring, or infecting organisms that would explain your condition. If you have a kidney transplant, a biopsy can also help explain why a transplanted kidney is not working properly. Talk with your health care provider about what information might be learned from the biopsy and the risks involved. This can help you make a decision about whether a biopsy is worthwhile in your case. LET [facility] CARE PROVIDER KNOW ABOUT:  Any allergies you have.  All medicines you are taking, including vitamins, herbs, eye drops, creams, and over-the-counter medicines.  Previous problems you or members of your family have had with the use of anesthetics.  Any blood disorders you have.  Previous surgeries you have had.  Medical conditions you have. RISKS AND COMPLICATIONS Generally, a kidney biopsy is a safe procedure. However, as with any procedure, complications can occur. Possible complications include:  Infection.  Bleeding. BEFORE THE PROCEDURE  Make sure you understand the need for a biopsy.  Do not eat or drink for 8 hours before the test or as directed by your health care provider.  You will need to give blood and urine samples before the biopsy. This is to make sure you do not have a condition where you should not have a biopsy. PROCEDURE Kidney biopsies are usually done in a hospital. During  the procedure, you may be fully awake with light sedation, or you may be asleep under general anesthesia. The entire procedure usually takes an hour.  You will lie on your stomach to position the kidneys near the surface of your back. If you have a transplanted kidney, you will lie on your back.  The health care provider will inject a local painkiller. For a through-the-skin (percutaneous) biopsy, the health care provider will use a locating needle and X-ray or ultrasound equipment to find the right spot.  A collecting needle will be used to gather the tissue. If you are awake, you will be asked to hold your breath as the needle is inserted and collects the tissue. Each insertion and collection lasts about 30 seconds or a little longer. You will be told when to exhale. AFTER THE PROCEDURE  You will lie on your back for 12 to 24 hours. If you have a transplanted kidney, you may not have to lie on your back. During this time, your back will probably feel sore. You may stay in the hospital overnight after the procedure so that staff can check your condition.  You may notice some blood in your urine for 24 hours after the test. To detect any problems, your health care providers will:  Monitor your blood pressure and pulse.  Take blood samples to measure the amount of red blood cells.  Examine the urine that you pass.  On rare occasions when bleeding is excessive, it may be necessary to replace lost blood with a transfusion.  It is your responsibility to obtain your test results.  Ask the lab or department performing the test when and how you will get your results. FOR MORE INFORMATION  American [employer]: https://mathis.com/  National Kidney Foundation: www.kidney.org  National Kidney and Urologic Diseases Information Clearinghouse: http://kidney.AmenCredit.is Document Released: 11/13/2003 Document Revised: 10/23/2012 Document Reviewed: 07/08/2012 [facility] Patient Information 2015 [place],  [place]. This information is not intended to replace advice given to you by your health care provider. Make sure you discuss any questions you have with your health care provider.

## 2013-10-22 NOTE — Procedures (Signed)
Procedure:  CT guided aspiration biopsy of right renal cystic lesion Findings:  18 G needle advanced under CT fluoro to level of complex right upper pole renal cystic lesion.  28 mL of fluid aspirated.  Lesion shows significant decompression after procedure.

## 2013-11-13 ENCOUNTER — Encounter: Payer: Self-pay | Admitting: Vascular Surgery

## 2013-11-13 ENCOUNTER — Other Ambulatory Visit: Payer: Self-pay | Admitting: *Deleted

## 2013-11-13 DIAGNOSIS — Z01810 Encounter for preprocedural cardiovascular examination: Secondary | ICD-10-CM

## 2013-11-13 DIAGNOSIS — N184 Chronic kidney disease, stage 4 (severe): Secondary | ICD-10-CM

## 2013-11-16 DIAGNOSIS — R011 Cardiac murmur, unspecified: Secondary | ICD-10-CM

## 2013-11-16 HISTORY — DX: Cardiac murmur, unspecified: R01.1

## 2013-11-26 ENCOUNTER — Encounter: Payer: Self-pay | Admitting: Vascular Surgery

## 2013-11-27 ENCOUNTER — Ambulatory Visit (HOSPITAL_COMMUNITY)
Admission: RE | Admit: 2013-11-27 | Discharge: 2013-11-27 | Disposition: A | Discharge status: Home / Self Care | Payer: 59 | Source: Ambulatory Visit | Attending: Vascular Surgery | Admitting: Vascular Surgery

## 2013-11-27 ENCOUNTER — Ambulatory Visit (INDEPENDENT_AMBULATORY_CARE_PROVIDER_SITE_OTHER): Payer: 59 | Admitting: Vascular Surgery

## 2013-11-27 ENCOUNTER — Encounter: Payer: Self-pay | Admitting: Vascular Surgery

## 2013-11-27 VITALS — BP 132/60 | HR 74 | Temp 97.4°F | Resp 18 | Ht 61.0 in | Wt 208.0 lb

## 2013-11-27 DIAGNOSIS — Z01810 Encounter for preprocedural cardiovascular examination: Secondary | ICD-10-CM | POA: Diagnosis not present

## 2013-11-27 DIAGNOSIS — N184 Chronic kidney disease, stage 4 (severe): Secondary | ICD-10-CM

## 2013-11-27 DIAGNOSIS — N185 Chronic kidney disease, stage 5: Secondary | ICD-10-CM

## 2013-11-27 NOTE — Progress Notes (Signed)
[facility] [place] HISTORY AND PHYSICAL   History of Present Illness:  Patient is a 58 y.o. year old female who presents for placement of a permanent hemodialysis access. The patient is right handed.  The patient is not currently on hemodialysis.  The cause of renal failure is thought to be secondary to hypertension.  She was previously seen by my partner Dr. Donnetta Hutching in 2013. At that time ultrasound showed veins too small for fistula creation.Other chronic medical problems include chronic kidney disease currently trying to get on the kidney transplant list as well as reflux and arthritis. These are currently stable..  Past Medical History  Diagnosis Date  . Chronic kidney disease     trying to get on kidney transplant list at [facility]  . Hypertension   . Chest pain   . Arthritis   . GERD (gastroesophageal reflux disease)     Past Surgical History  Procedure Laterality Date  . Rotator cuff repair Right 1997  . Cholecystectomy  2005  . Abdominal hysterectomy    . Knee arthroscopy Bilateral   . Bunionectomy Bilateral      Social History History  Substance Use Topics  . Smoking status: Never Smoker   . Smokeless tobacco: Never Used  . Alcohol Use: No    Family History Family History  Problem Relation Age of Onset  . Diabetes Sister   . Heart attack Father   . Hyperlipidemia Sister   . Hypertension Sister   . Kidney disease Father   . Kidney disease Mother   . Kidney disease Sister     x2    Allergies  No Known Allergies   Current Outpatient Prescriptions  Medication Sig Dispense Refill  . acetaminophen (TYLENOL) 500 MG tablet Take 500 mg by mouth every 6 (six) hours as needed for mild pain, moderate pain or headache.     Marland Kitchen amLODipine (NORVASC) 10 MG tablet Take 10 mg by mouth daily.    . Fe Fum-FePoly-Vit C-Vit B3 (INTEGRA PO) Take 1 tablet by mouth daily.     . furosemide (LASIX) 20 MG tablet Take 20 mg by mouth daily.    . isosorbide  mononitrate (IMDUR) 30 MG 24 hr tablet Take 1 tablet (30 mg total) by mouth daily. 90 tablet 3  . ondansetron (ZOFRAN-ODT) 4 MG disintegrating tablet TAKE 1 TABLET BY MOUTH EVERY 8 HOURS AS NEEDED FOR NAUSEA OR VOMITING. 20 tablet 1  . pantoprazole (PROTONIX) 40 MG tablet Take 1 tablet (40 mg total) by mouth daily. 30 tablet 11  . paricalcitol (ZEMPLAR) 1 MCG capsule Take 1 mcg by mouth daily.    . polyethylene glycol (MIRALAX / GLYCOLAX) packet Take 17 g by mouth daily as needed for mild constipation.     No current facility-administered medications for this visit.    ROS:   General:  No weight loss, Fever, chills  HEENT: No recent headaches, no nasal bleeding, no visual changes, no sore throat  Neurologic: No dizziness, blackouts, seizures. No recent symptoms of stroke or mini- stroke. No recent episodes of slurred speech, or temporary blindness.  Cardiac: No recent episodes of chest pain/pressure, no shortness of breath at rest.  + shortness of breath with exertion.  Denies history of atrial fibrillation or irregular heartbeat  Vascular: No history of rest pain in feet.  No history of claudication.  No history of non-healing ulcer, No history of DVT   Pulmonary: No home oxygen, no productive cough, no hemoptysis,  No  asthma or wheezing  Musculoskeletal:  [ ]  Arthritis, [ ]  Low back pain,  [ ]  Joint pain  Hematologic:No history of hypercoagulable state.  No history of easy bleeding.  No history of anemia  Gastrointestinal: No hematochezia or melena,  No gastroesophageal reflux, no trouble swallowing  Urinary: [x ] chronic Kidney disease, [ ]  on HD - [ ]  MWF or [ ]  TTHS, [ ]  Burning with urination, [ ]  Frequent urination, [ ]  Difficulty urinating;   Skin: No rashes  Psychological: No history of anxiety,  No history of depression   Physical Examination  Filed Vitals:   11/27/13 1306  BP: 132/60  Pulse: 74  Temp: 97.4 F (36.3 C)  TempSrc: Oral  Resp: 18  Height: 5\' 1"   (1.549 m)  Weight: 208 lb (94.348 kg)  SpO2: 100%    Body mass index is 39.32 kg/(m^2).  General:  Alert and oriented, no acute distress HEENT: Normal Neck: No bruit or JVD Pulmonary: Clear to auscultation bilaterally Cardiac: Regular Rate and Rhythm without murmur Gastrointestinal: Soft, non-tender, non-distended, no mass Skin: No rash Extremity Pulses:  absentradial, 2+brachial pulses bilaterally Musculoskeletal: No deformity or edema  Neurologic: Upper and lower extremity motor 5/5 and symmetric  DATA: prior vein mapping ultrasound was reviewed which shows small an adequate basilic and cephalic veins bilaterally. Arterial duplex exam from today reveals a small radial artery bilaterally but patent. The brachial artery was patent and 4 mm in diameter. I reviewed and interpreted the arterial study today.   ASSESSMENT: patient needs long-term hemodialysis access. She is approaching need for hemodialysis probably by first of the year according to her. She is not a candidate for a fistula. We will place a left arm AV graft on 12/09/2013. Risks benefits possible, location to procedure details were explained the patient today including not limited to bleeding infection graft thrombosis ischemic steal. She understands and agrees to proceed.   PLAN:see above  Ruta Hinds, MD [facility] [place] Office: [phone] Pager: [phone]

## 2013-12-01 ENCOUNTER — Ambulatory Visit: Payer: 59 | Admitting: Cardiology

## 2013-12-01 ENCOUNTER — Ambulatory Visit (INDEPENDENT_AMBULATORY_CARE_PROVIDER_SITE_OTHER): Payer: 59 | Admitting: Cardiology

## 2013-12-01 ENCOUNTER — Encounter: Payer: Self-pay | Admitting: Cardiology

## 2013-12-01 VITALS — BP 138/80 | HR 83 | Ht 61.0 in | Wt 210.7 lb

## 2013-12-01 DIAGNOSIS — R002 Palpitations: Secondary | ICD-10-CM

## 2013-12-01 DIAGNOSIS — T732XXA Exhaustion due to exposure, initial encounter: Secondary | ICD-10-CM

## 2013-12-01 DIAGNOSIS — R011 Cardiac murmur, unspecified: Secondary | ICD-10-CM

## 2013-12-01 DIAGNOSIS — R079 Chest pain, unspecified: Secondary | ICD-10-CM

## 2013-12-01 LAB — CBC
HCT: 32.3 % — ABNORMAL LOW (ref 36.0–46.0)
Hemoglobin: 10.8 g/dL — ABNORMAL LOW (ref 12.0–15.0)
MCH: 30.3 pg (ref 26.0–34.0)
MCHC: 33.4 g/dL (ref 30.0–36.0)
MCV: 90.7 fL (ref 78.0–100.0)
MPV: 9.9 fL (ref 9.4–12.4)
PLATELETS: 296 10*3/uL (ref 150–400)
RBC: 3.56 MIL/uL — ABNORMAL LOW (ref 3.87–5.11)
RDW: 15.2 % (ref 11.5–15.5)
WBC: 5.3 10*3/uL (ref 4.0–10.5)

## 2013-12-01 NOTE — Addendum Note (Signed)
Addended by: Janett Labella A on: 12/01/2013 04:22 PM   Modules accepted: Orders

## 2013-12-01 NOTE — Progress Notes (Signed)
12/01/2013 Oleh Genin   11-Oct-1955  [id number]  Primary Physician Elyn Peers, MD Primary Cardiologist: Dr. Gwenlyn Found  HPI:  The patient is a 58 year old female, followed by Dr. Gwenlyn Found, who presents to clinic today for routine 6 month follow-up. Her past medical history is significant for hypertension as well as chronic kidney disease. She also has a history of atypical chest pain. In June 2014, she underwent a myocardial perfusion imaging study which was negative for ischemia. Systolic function at that time was normal with an estimated ejection fraction of 68%.  Today in clinic, she states that she has been doing fairly well since her last office visit with Dr. Gwenlyn Found. However, she reports experiencing frequent episodes of intermittent palpitations that have been occurring over the past several weeks. She denies syncope/near-syncope. No significant dyspnea or chest pain. She has also noticed frequent fatigue for the past several weeks. She does have a history of anemia secondary to her chronic kidney disease.  Her last CBC was 10/22/2013 and hemoglobin at that time was 10.4. She tells me that she is approaching the need for hemodialysis, probably about the first at the year. She was recently seen by Dr. Oneida Alar and was deemed not a candidate for a fistula. The plan is for her to undergo a left arm AV graft by Dr. Oneida Alar on 12/09/2013.  Today in clinic, her EKG today demonstrates normal sinus rhythm and no ischemic abnormalities. BP is stable at 138/80. Physical exam is notable for a newly detected systolic murmur best heard along the right upper sternal border and left upper sternal border. She states that she has never been told of any cardiac murmurs in the past.   Current Outpatient Prescriptions  Medication Sig Dispense Refill  . acetaminophen (TYLENOL) 500 MG tablet Take 500 mg by mouth every 6 (six) hours as needed for mild pain, moderate pain or headache.     Marland Kitchen amLODipine (NORVASC) 10 MG  tablet Take 10 mg by mouth daily.    . Fe Fum-FePoly-Vit C-Vit B3 (INTEGRA PO) Take 1 tablet by mouth daily.     . furosemide (LASIX) 20 MG tablet Take 20 mg by mouth daily.    . isosorbide mononitrate (IMDUR) 30 MG 24 hr tablet Take 1 tablet (30 mg total) by mouth daily. 90 tablet 3  . ondansetron (ZOFRAN-ODT) 4 MG disintegrating tablet TAKE 1 TABLET BY MOUTH EVERY 8 HOURS AS NEEDED FOR NAUSEA OR VOMITING. 20 tablet 1  . pantoprazole (PROTONIX) 40 MG tablet Take 1 tablet (40 mg total) by mouth daily. 30 tablet 11  . paricalcitol (ZEMPLAR) 1 MCG capsule Take 1 mcg by mouth daily.    . polyethylene glycol (MIRALAX / GLYCOLAX) packet Take 17 g by mouth daily as needed for mild constipation.     No current facility-administered medications for this visit.    No Known Allergies  History   Social History  . Marital Status: Married    Spouse Name: N/A    Number of Children: 2  . Years of Education: N/A   Occupational History  . [place] [employer]   Social History Main Topics  . Smoking status: Never Smoker   . Smokeless tobacco: Never Used  . Alcohol Use: No  . Drug Use: No  . Sexual Activity: Yes    Birth Control/ Protection: None     Comment: Hysterectomy   Other Topics Concern  . Not on file   Social History Narrative     Review of  Systems: General: negative for chills, fever, night sweats or weight changes.  Cardiovascular: negative for chest pain, dyspnea on exertion, edema, orthopnea, palpitations, paroxysmal nocturnal dyspnea or shortness of breath Dermatological: negative for rash Respiratory: negative for cough or wheezing Urologic: negative for hematuria Abdominal: negative for nausea, vomiting, diarrhea, bright red blood per rectum, melena, or hematemesis Neurologic: negative for visual changes, syncope, or dizziness All other systems reviewed and are otherwise negative except as noted above.    Blood pressure 138/80, pulse 83, height 5\' 1"   (1.549 m), weight 210 lb 11.2 oz (95.573 kg).  General appearance: alert, cooperative and no distress Neck: no carotid bruit and no JVD Lungs: clear to auscultation bilaterally Heart: regular rate and rhythm and 1/6 SM at RUSB and LUSB Extremities: no LEE Pulses: 2+ and symmetric Skin: warm and dry Neurologic: Grossly normal  EKG normal sinus rhythm. Heart rate 83 bpm. No ischemic abnormalities.  ASSESSMENT AND PLAN:   1. Palpitations: episodes have been occurring intermittently for the past several weeks and associated with fatigue. EKG today demonstrates normal sinus rhythm. Heart rate 83 bpm. Will evaluate with a two-week cardiac event monitor. Will check TSH.  2. Fatigue: She reports constant fatigue for the last several weeks. Associated with palpitations. She does have a history of anemia secondary to chronic kidney disease and last CBC was 10/22/2013. Hemoglobin at that time was 10.1. Will recheck a CBC today. Also check a TSH. Two-week cardiac event monitor in the setting of palpitations, to rule out potential cardiac arrhythmias that could result in fatigue. We'll also order a 2-D echocardiogram in the setting of a newly detected cardiac murmur.  1. Hypertension: BP is well controlled and stable. Continue amlodipine, Imdur and Lasix.  2. History of atypical chest pain: no recent anginal symptoms. Last ischemic evaluation was a nuclear stress test in June 2014 which was negative for ischemia.  3. Chronic kidney disease: followed by [facility]. Patient is nearing the need for hemodialysis. Plan is for left arm AV graft by Dr. Oneida Alar on 12/09/2013.   PLAN  2 week event monitor, CBC, TSH and 2-D echocardiogram. Follow-up in 3 weeks with either Dr. Gwenlyn Found or an extender for reassessment and to review results. Continue current medications as prescribed.  Kathren Scearce, BRITTAINYPA-C 12/01/2013 2:22 PM

## 2013-12-01 NOTE — Patient Instructions (Signed)
Your physician has recommended that you wear an event monitor for 2 weeks. Event monitors are medical devices that record the heart's electrical activity. Doctors most often [place] these monitors to diagnose arrhythmias. Arrhythmias are problems with the speed or rhythm of the heartbeat. The monitor is a small, portable device. You can wear one while you do your normal daily activities. This is usually used to diagnose what is causing palpitations/syncope (passing out).  Your physician has requested that you have an echocardiogram. Echocardiography is a painless test that uses sound waves to create images of your heart. It provides your doctor with information about the size and shape of your heart and how well your heart's chambers and valves are working. This procedure takes approximately one hour. There are no restrictions for this procedure.  Your physician recommends that you return for lab work in: [place].  Your physician recommends that you schedule a follow-up appointment in: 3 weeks with Dr. Gwenlyn Found or an extender.

## 2013-12-02 ENCOUNTER — Other Ambulatory Visit: Payer: Self-pay

## 2013-12-02 LAB — TSH: TSH: 1.297 u[IU]/mL (ref 0.350–4.500)

## 2013-12-03 ENCOUNTER — Encounter (HOSPITAL_COMMUNITY)
Admission: RE | Admit: 2013-12-03 | Discharge: 2013-12-03 | Disposition: A | Discharge status: Home / Self Care | Payer: 59 | Source: Ambulatory Visit | Attending: Nephrology | Admitting: Nephrology

## 2013-12-03 DIAGNOSIS — D631 Anemia in chronic kidney disease: Secondary | ICD-10-CM | POA: Diagnosis not present

## 2013-12-03 DIAGNOSIS — N184 Chronic kidney disease, stage 4 (severe): Secondary | ICD-10-CM | POA: Diagnosis not present

## 2013-12-03 LAB — POCT HEMOGLOBIN-HEMACUE: Hemoglobin: 10 g/dL — ABNORMAL LOW (ref 12.0–15.0)

## 2013-12-03 MED ORDER — DARBEPOETIN ALFA 60 MCG/0.3ML IJ SOSY
60.0000 ug | PREFILLED_SYRINGE | INTRAMUSCULAR | Status: DC
  Administered 2013-12-03: 60 ug via SUBCUTANEOUS

## 2013-12-03 MED ORDER — DARBEPOETIN ALFA 60 MCG/0.3ML IJ SOSY
PREFILLED_SYRINGE | INTRAMUSCULAR | Status: AC
  Filled 2013-12-03: qty 0.3

## 2013-12-03 NOTE — Discharge Instructions (Signed)
Darbepoetin Alfa injection What is this medicine? DARBEPOETIN ALFA (dar be POE e tin AL fa) helps your body make more red blood cells. It is used to treat anemia caused by chronic kidney failure and chemotherapy. This medicine may be used for other purposes; ask your health care provider or pharmacist if you have questions. COMMON BRAND NAME(S): Aranesp What should I tell my health care provider before I take this medicine? They need to know if you have any of these conditions: -blood clotting disorders or history of blood clots -cancer patient not on chemotherapy -cystic fibrosis -heart disease, such as angina, heart failure, or a history of a heart attack -hemoglobin level of 12 g/dL or greater -high blood pressure -low levels of folate, iron, or vitamin B12 -seizures -an unusual or allergic reaction to darbepoetin, erythropoietin, albumin, hamster proteins, latex, other medicines, foods, dyes, or preservatives -pregnant or trying to get pregnant -breast-feeding How should I use this medicine? This medicine is for injection into a vein or under the skin. It is usually given by a health care professional in a hospital or clinic setting. If you get this medicine at home, you will be taught how to prepare and give this medicine. Do not shake the solution before you withdraw a dose. Use exactly as directed. Take your medicine at regular intervals. Do not take your medicine more often than directed. It is important that you put your used needles and syringes in a special sharps container. Do not put them in a trash can. If you do not have a sharps container, call your pharmacist or healthcare provider to get one. Talk to your pediatrician regarding the use of this medicine in children. While this medicine may be used in children as young as 1 year for selected conditions, precautions do apply. Overdosage: If you think you have taken too much of this medicine contact a poison control center or  emergency room at once. NOTE: This medicine is only for you. Do not share this medicine with others. What if I miss a dose? If you miss a dose, take it as soon as you can. If it is almost time for your next dose, take only that dose. Do not take double or extra doses. What may interact with this medicine? Do not take this medicine with any of the following medications: -epoetin alfa This list may not describe all possible interactions. Give your health care provider a list of all the medicines, herbs, non-prescription drugs, or dietary supplements you use. Also tell them if you smoke, drink alcohol, or use illegal drugs. Some items may interact with your medicine. What should I watch for while using this medicine? Visit your prescriber or health care professional for regular checks on your progress and for the needed blood tests and blood pressure measurements. It is especially important for the doctor to make sure your hemoglobin level is in the desired range, to limit the risk of potential side effects and to give you the best benefit. Keep all appointments for any recommended tests. Check your blood pressure as directed. Ask your doctor what your blood pressure should be and when you should contact him or her. As your body makes more red blood cells, you may need to take iron, folic acid, or vitamin B supplements. Ask your doctor or health care provider which products are right for you. If you have kidney disease continue dietary restrictions, even though this medication can make you feel better. Talk with your doctor or health   care professional about the foods you eat and the vitamins that you take. What side effects may I notice from receiving this medicine? Side effects that you should report to your doctor or health care professional as soon as possible: -allergic reactions like skin rash, itching or hives, swelling of the face, lips, or tongue -breathing problems -changes in vision -chest  pain -confusion, trouble speaking or understanding -feeling faint or lightheaded, falls -high blood pressure -muscle aches or pains -pain, swelling, warmth in the leg -rapid weight gain -severe headaches -sudden numbness or weakness of the face, arm or leg -trouble walking, dizziness, loss of balance or coordination -seizures (convulsions) -swelling of the ankles, feet, hands -unusually weak or tired Side effects that usually do not require medical attention (report to your doctor or health care professional if they continue or are bothersome): -diarrhea -fever, chills (flu-like symptoms) -headaches -nausea, vomiting -redness, stinging, or swelling at site where injected This list may not describe all possible side effects. Call your doctor for medical advice about side effects. You may report side effects to FDA at 1-800-FDA-1088. Where should I keep my medicine? Keep out of the reach of children. Store in a refrigerator between 2 and 8 degrees C (36 and 46 degrees F). Do not freeze. Do not shake. Throw away any unused portion if using a single-dose vial. Throw away any unused medicine after the expiration date. NOTE: This sheet is a summary. It may not cover all possible information. If you have questions about this medicine, talk to your doctor, pharmacist, or health care provider.  2015, Elsevier/Gold Standard. (2007-12-17 10:23:57)  

## 2013-12-08 ENCOUNTER — Ambulatory Visit (HOSPITAL_COMMUNITY)
Admission: RE | Admit: 2013-12-08 | Discharge: 2013-12-08 | Disposition: A | Discharge status: Home / Self Care | Payer: 59 | Source: Ambulatory Visit | Attending: Cardiology | Admitting: Cardiology

## 2013-12-08 ENCOUNTER — Encounter (HOSPITAL_COMMUNITY): Payer: Self-pay | Admitting: *Deleted

## 2013-12-08 DIAGNOSIS — R011 Cardiac murmur, unspecified: Secondary | ICD-10-CM | POA: Insufficient documentation

## 2013-12-08 DIAGNOSIS — I10 Essential (primary) hypertension: Secondary | ICD-10-CM | POA: Insufficient documentation

## 2013-12-08 DIAGNOSIS — I519 Heart disease, unspecified: Secondary | ICD-10-CM

## 2013-12-08 MED ORDER — DEXTROSE 5 % IV SOLN
1.5000 g | INTRAVENOUS | Status: AC
  Administered 2013-12-09: 1.5 g via INTRAVENOUS
  Filled 2013-12-08: qty 1.5

## 2013-12-08 NOTE — Progress Notes (Signed)
2D Echocardiogram Complete.  12/08/2013   Karen [name], RDCS

## 2013-12-08 NOTE — Progress Notes (Signed)
Karen [name] had an ECHO today, no results as of this time.  Karen [name] is wering a heart monitor , has been instructed to remove it for surgery.

## 2013-12-09 ENCOUNTER — Encounter (HOSPITAL_COMMUNITY)
Admission: RE | Disposition: A | Discharge status: Home / Self Care | Payer: Self-pay | Source: Ambulatory Visit | Attending: Vascular Surgery

## 2013-12-09 ENCOUNTER — Telehealth: Payer: Self-pay | Admitting: Vascular Surgery

## 2013-12-09 ENCOUNTER — Encounter (HOSPITAL_COMMUNITY): Payer: Self-pay | Admitting: Certified Registered Nurse Anesthetist

## 2013-12-09 ENCOUNTER — Ambulatory Visit (HOSPITAL_COMMUNITY): Payer: 59 | Admitting: Certified Registered Nurse Anesthetist

## 2013-12-09 ENCOUNTER — Ambulatory Visit (HOSPITAL_COMMUNITY)
Admission: RE | Admit: 2013-12-09 | Discharge: 2013-12-09 | Disposition: A | Discharge status: Home / Self Care | Payer: 59 | Source: Ambulatory Visit | Attending: Vascular Surgery | Admitting: Vascular Surgery

## 2013-12-09 DIAGNOSIS — K219 Gastro-esophageal reflux disease without esophagitis: Secondary | ICD-10-CM | POA: Diagnosis not present

## 2013-12-09 DIAGNOSIS — I120 Hypertensive chronic kidney disease with stage 5 chronic kidney disease or end stage renal disease: Secondary | ICD-10-CM | POA: Diagnosis present

## 2013-12-09 DIAGNOSIS — N186 End stage renal disease: Secondary | ICD-10-CM | POA: Insufficient documentation

## 2013-12-09 HISTORY — PX: AV FISTULA PLACEMENT: SHX1204

## 2013-12-09 HISTORY — DX: Cardiac murmur, unspecified: R01.1

## 2013-12-09 HISTORY — DX: Adverse effect of unspecified anesthetic, initial encounter: T41.45XA

## 2013-12-09 HISTORY — DX: Anemia, unspecified: D64.9

## 2013-12-09 HISTORY — DX: Constipation, unspecified: K59.00

## 2013-12-09 HISTORY — DX: Reserved for inherently not codable concepts without codable children: IMO0001

## 2013-12-09 LAB — POCT I-STAT 4, (NA,K, GLUC, HGB,HCT)
Glucose, Bld: 96 mg/dL (ref 70–99)
HCT: 33 % — ABNORMAL LOW (ref 36.0–46.0)
Hemoglobin: 11.2 g/dL — ABNORMAL LOW (ref 12.0–15.0)
POTASSIUM: 4.2 meq/L (ref 3.7–5.3)
Sodium: 142 mEq/L (ref 137–147)

## 2013-12-09 SURGERY — INSERTION OF ARTERIOVENOUS (AV) GORE-TEX GRAFT ARM
Anesthesia: Monitor Anesthesia Care | Site: Arm Upper | Laterality: Left

## 2013-12-09 MED ORDER — LIDOCAINE HCL (CARDIAC) 20 MG/ML IV SOLN
INTRAVENOUS | Status: AC
  Filled 2013-12-09: qty 5

## 2013-12-09 MED ORDER — SODIUM CHLORIDE 0.9 % IV SOLN
INTRAVENOUS | Status: DC
  Administered 2013-12-09: 10:00:00 via INTRAVENOUS

## 2013-12-09 MED ORDER — HEPARIN SODIUM (PORCINE) 1000 UNIT/ML IJ SOLN
INTRAMUSCULAR | Status: DC | PRN
  Administered 2013-12-09: 10000 [IU] via INTRAVENOUS

## 2013-12-09 MED ORDER — SODIUM CHLORIDE 0.9 % IV SOLN
INTRAVENOUS | Status: DC | PRN
  Administered 2013-12-09: 13:00:00 via INTRAVENOUS

## 2013-12-09 MED ORDER — HYDROMORPHONE HCL 1 MG/ML IJ SOLN
INTRAMUSCULAR | Status: AC
  Filled 2013-12-09: qty 1

## 2013-12-09 MED ORDER — PROPOFOL INFUSION 10 MG/ML OPTIME
INTRAVENOUS | Status: DC | PRN
  Administered 2013-12-09: 100 ug/kg/min via INTRAVENOUS

## 2013-12-09 MED ORDER — MEPERIDINE HCL 25 MG/ML IJ SOLN: 6.2500 mg | INTRAMUSCULAR | Status: DC | PRN

## 2013-12-09 MED ORDER — OXYCODONE HCL 5 MG PO TABS
ORAL_TABLET | ORAL | Status: AC
  Filled 2013-12-09: qty 1

## 2013-12-09 MED ORDER — 0.9 % SODIUM CHLORIDE (POUR BTL) OPTIME
TOPICAL | Status: DC | PRN
  Administered 2013-12-09: 1000 mL

## 2013-12-09 MED ORDER — THROMBIN 20000 UNITS EX SOLR
CUTANEOUS | Status: AC
  Filled 2013-12-09: qty 20000

## 2013-12-09 MED ORDER — SODIUM CHLORIDE 0.9 % IV SOLN: INTRAVENOUS | Status: DC

## 2013-12-09 MED ORDER — STERILE WATER FOR INJECTION IJ SOLN
INTRAMUSCULAR | Status: AC
  Filled 2013-12-09: qty 10

## 2013-12-09 MED ORDER — MIDAZOLAM HCL 5 MG/5ML IJ SOLN
INTRAMUSCULAR | Status: DC | PRN
  Administered 2013-12-09: 2 mg via INTRAVENOUS

## 2013-12-09 MED ORDER — PROPOFOL 10 MG/ML IV BOLUS
INTRAVENOUS | Status: AC
  Filled 2013-12-09: qty 20

## 2013-12-09 MED ORDER — LIDOCAINE HCL (PF) 1 % IJ SOLN
INTRAMUSCULAR | Status: AC
  Filled 2013-12-09: qty 30

## 2013-12-09 MED ORDER — HYDROMORPHONE HCL 1 MG/ML IJ SOLN
0.2500 mg | INTRAMUSCULAR | Status: DC | PRN
  Administered 2013-12-09: 0.5 mg via INTRAVENOUS

## 2013-12-09 MED ORDER — HEPARIN SODIUM (PORCINE) 5000 UNIT/ML IJ SOLN
INTRAMUSCULAR | Status: DC | PRN
  Administered 2013-12-09: 500 mL

## 2013-12-09 MED ORDER — NITROGLYCERIN 0.4 MG SL SUBL
0.4000 mg | SUBLINGUAL_TABLET | SUBLINGUAL | Status: DC | PRN
  Administered 2013-12-09: 0.4 mg via SUBLINGUAL

## 2013-12-09 MED ORDER — OXYCODONE HCL 5 MG PO TABS
5.0000 mg | ORAL_TABLET | Freq: Once | ORAL | Status: AC | PRN
  Administered 2013-12-09: 5 mg via ORAL

## 2013-12-09 MED ORDER — MIDAZOLAM HCL 2 MG/2ML IJ SOLN
INTRAMUSCULAR | Status: AC
  Filled 2013-12-09: qty 2

## 2013-12-09 MED ORDER — NITROGLYCERIN 0.4 MG SL SUBL
SUBLINGUAL_TABLET | SUBLINGUAL | Status: AC
  Filled 2013-12-09: qty 1

## 2013-12-09 MED ORDER — LIDOCAINE HCL (PF) 1 % IJ SOLN
INTRAMUSCULAR | Status: DC | PRN
  Administered 2013-12-09: 24 mL

## 2013-12-09 MED ORDER — OXYCODONE HCL 5 MG/5ML PO SOLN: 5.0000 mg | Freq: Once | ORAL | Status: AC | PRN

## 2013-12-09 MED ORDER — EPHEDRINE SULFATE 50 MG/ML IJ SOLN
INTRAMUSCULAR | Status: AC
  Filled 2013-12-09: qty 1

## 2013-12-09 MED ORDER — ONDANSETRON HCL 4 MG/2ML IJ SOLN: 4.0000 mg | Freq: Once | INTRAMUSCULAR | Status: DC | PRN

## 2013-12-09 MED ORDER — OXYCODONE-ACETAMINOPHEN 5-325 MG PO TABS: 1.0000 | ORAL_TABLET | Freq: Four times a day (QID) | ORAL | Status: DC | PRN

## 2013-12-09 MED ORDER — FENTANYL CITRATE 0.05 MG/ML IJ SOLN
INTRAMUSCULAR | Status: AC
  Filled 2013-12-09: qty 5

## 2013-12-09 MED ORDER — FENTANYL CITRATE 0.05 MG/ML IJ SOLN
INTRAMUSCULAR | Status: DC | PRN
  Administered 2013-12-09: 50 ug via INTRAVENOUS

## 2013-12-09 MED ORDER — ONDANSETRON HCL 4 MG/2ML IJ SOLN
INTRAMUSCULAR | Status: DC | PRN
  Administered 2013-12-09: 4 mg via INTRAVENOUS

## 2013-12-09 MED ORDER — PROTAMINE SULFATE 10 MG/ML IV SOLN
INTRAVENOUS | Status: DC | PRN
Start: 2013-12-09 — End: 2013-12-09
  Administered 2013-12-09: 100 mg via INTRAVENOUS

## 2013-12-09 MED ORDER — CHLORHEXIDINE GLUCONATE 4 % EX LIQD
60.0000 mL | Freq: Once | CUTANEOUS | Status: DC
  Filled 2013-12-09: qty 60

## 2013-12-09 MED ORDER — CHLORHEXIDINE GLUCONATE 4 % EX LIQD: 60.0000 mL | Freq: Once | CUTANEOUS | Status: DC

## 2013-12-09 SURGICAL SUPPLY — 38 items
1% LIDOCAINE / 30 ML BOTTLE ×2 IMPLANT
ARMBAND PINK RESTRICT EXTREMIT (MISCELLANEOUS) ×2 IMPLANT
CANISTER SUCTION 2500CC (MISCELLANEOUS) ×2 IMPLANT
CANNULA VESSEL 3MM 2 BLNT TIP (CANNULA) ×2 IMPLANT
CLIP TI MEDIUM 6 (CLIP) ×2 IMPLANT
CLIP TI WIDE RED SMALL 6 (CLIP) ×2 IMPLANT
DECANTER SPIKE VIAL GLASS SM (MISCELLANEOUS) IMPLANT
DERMABOND ADVANCED (GAUZE/BANDAGES/DRESSINGS) ×2
DERMABOND ADVANCED .7 DNX12 (GAUZE/BANDAGES/DRESSINGS) ×2 IMPLANT
ELECT REM PT RETURN 9FT ADLT (ELECTROSURGICAL) ×2
ELECTRODE REM PT RTRN 9FT ADLT (ELECTROSURGICAL) ×1 IMPLANT
GEL ULTRASOUND 20GR AQUASONIC (MISCELLANEOUS) IMPLANT
GLOVE BIO SURGEON STRL SZ7 (GLOVE) ×2 IMPLANT
GLOVE BIO SURGEON STRL SZ7.5 (GLOVE) ×2 IMPLANT
GLOVE BIOGEL PI IND STRL 6.5 (GLOVE) ×3 IMPLANT
GLOVE BIOGEL PI IND STRL 7.0 (GLOVE) ×1 IMPLANT
GLOVE BIOGEL PI INDICATOR 6.5 (GLOVE) ×3
GLOVE BIOGEL PI INDICATOR 7.0 (GLOVE) ×1
GOWN STRL REUS W/ TWL LRG LVL3 (GOWN DISPOSABLE) ×3 IMPLANT
GOWN STRL REUS W/TWL LRG LVL3 (GOWN DISPOSABLE) ×3
GRAFT GORETEX STRT 4-7X45 (Vascular Products) ×2 IMPLANT
KIT BASIN OR (CUSTOM PROCEDURE TRAY) ×2 IMPLANT
KIT ROOM TURNOVER OR (KITS) ×2 IMPLANT
LIQUID BAND (GAUZE/BANDAGES/DRESSINGS) ×2 IMPLANT
LOOP VESSEL MINI RED (MISCELLANEOUS) IMPLANT
NS IRRIG 1000ML POUR BTL (IV SOLUTION) ×2 IMPLANT
PACK CV ACCESS (CUSTOM PROCEDURE TRAY) ×2 IMPLANT
PAD ARMBOARD 7.5X6 YLW CONV (MISCELLANEOUS) ×4 IMPLANT
PROBE PENCIL 8 MHZ STRL DISP (MISCELLANEOUS) IMPLANT
SPONGE SURGIFOAM ABS GEL 100 (HEMOSTASIS) IMPLANT
SUT PROLENE 6 0 CC (SUTURE) ×2 IMPLANT
SUT SILK 3 0 (SUTURE) ×1
SUT SILK 3-0 18XBRD TIE 12 (SUTURE) ×1 IMPLANT
SUT VIC AB 3-0 SH 27 (SUTURE) ×1
SUT VIC AB 3-0 SH 27X BRD (SUTURE) ×1 IMPLANT
SUT VICRYL 4-0 PS2 18IN ABS (SUTURE) ×2 IMPLANT
UNDERPAD 30X30 INCONTINENT (UNDERPADS AND DIAPERS) ×2 IMPLANT
WATER STERILE IRR 1000ML POUR (IV SOLUTION) IMPLANT

## 2013-12-09 NOTE — Anesthesia Postprocedure Evaluation (Signed)
Anesthesia Post Note  Patient: Karen [name]  Procedure(s) Performed: Procedure(s) (LRB): INSERTION OF ARTERIOVENOUS (AV) GORE-TEX GRAFT ARM (Left)  Anesthesia type: general  Patient location: PACU  Post pain: Pain level controlled  Post assessment: Patient's Cardiovascular Status Stable  Last Vitals:  Filed Vitals:   12/09/13 1551  BP: 148/73  Pulse: 81  Temp:   Resp: 12    Post vital signs: Reviewed and stable  Level of consciousness: sedated  Complications: No apparent anesthesia complications

## 2013-12-09 NOTE — Discharge Instructions (Signed)
What to eat: ° °For your first meals, you should eat lightly; only small meals initially.  If you do not have nausea, you may eat larger meals.  Avoid spicy, greasy and heavy food.   ° °General Anesthesia, Adult, Care After  °Refer to this sheet in the next few weeks. These instructions provide you with information on caring for yourself after your procedure. Your health care provider may also give you more specific instructions. Your treatment has been planned according to current medical practices, but problems sometimes occur. Call your health care provider if you have any problems or questions after your procedure.  °WHAT TO EXPECT AFTER THE PROCEDURE  °After the procedure, it is typical to experience:  °Sleepiness.  °Nausea and vomiting. °HOME CARE INSTRUCTIONS  °For the first 24 hours after general anesthesia:  °Have a responsible person with you.  °Do not drive a car. If you are alone, do not take public transportation.  °Do not drink alcohol.  °Do not take medicine that has not been prescribed by your health care provider.  °Do not sign important papers or make important decisions.  °You may resume a normal diet and activities as directed by your health care provider.  °Change bandages (dressings) as directed.  °If you have questions or problems that seem related to general anesthesia, call the hospital and ask for the anesthetist or anesthesiologist on call. °SEEK MEDICAL CARE IF:  °You have nausea and vomiting that continue the day after anesthesia.  °You develop a rash. °SEEK IMMEDIATE MEDICAL CARE IF:  °You have difficulty breathing.  °You have chest pain.  °You have any allergic problems. °Document Released: 04/10/2000 Document Revised: 09/04/2012 Document Reviewed: 07/18/2012  °ExitCare® Patient Information ©2014 [employer].  ° °Tissue Adhesive Wound Care  ° ° °Some cuts and wounds can be closed with tissue adhesive. Adhesive is like glue. It holds the skin together and helps a wound heal faster.  This adhesive goes away on its own as the wound heals.  °HOME CARE  °Showers are allowed. Do not soak the wound in water. Do not take baths, swim, or use hot tubs. Do not use soaps or creams on your wound.  °If a bandage (dressing) was put on, change it as often as told by your doctor.  °Keep the bandage dry.  °Do not scratch, pick, or rub the adhesive.  °Do not put tape over the adhesive. The adhesive could come off.  °Protect the wound from another injury.  °Protect the wound from sun and tanning beds.  °Only take medicine as told by your doctor.  °Keep all doctor visits as told. °GET HELP RIGHT AWAY IF:  °Your wound is red, puffy (swollen), hot, or tender.  °You get a rash after the glue is put on.  °You have more pain in the wound.  °You have a red streak going away from the wound.  °You have yellowish-white fluid (pus) coming from the wound.  °You have more bleeding.  °You have a fever.  °You have chills and start to shake.  °You notice a bad smell coming from the wound.  °Your wound or adhesive breaks open. °MAKE SURE YOU:  °Understand these instructions.  °Will watch your condition.  °Will get help right away if you are not doing well or get worse. °Document Released: 10/12/2007 Document Revised: 10/23/2012 Document Reviewed: 07/24/2012  °ExitCare® Patient Information ©2015 [employer]. This information is not intended to replace advice given to you by your health care provider.   Make sure you discuss any questions you have with your health care provider.  ° ° °

## 2013-12-09 NOTE — Op Note (Signed)
Procedure: Left Upper Arm AV graft  Preop: ESRD  Postop: ESRD  Asst: Silva Bandy PA C  Anesthesia: Local with IV sedation  Findings:4-7 mm PTFE end to side to axillary vein   Procedure Details: The right upper extremity was prepped and draped in usual sterile fashion.  A longitudinal incision was then made near the antecubital crease the left arm.  There were no suitable antecubital veins for outflow.   The incision was carried into the subcutaneous tissues down to level of the brachial artery.  Next the brachial artery was dissected free in the medial portion incision. The artery was 3 mm in diameter. The vessel loops were placed proximal and distal to the planned site of arteriotomy. At this point, a longitudinal incision was made in the axilla and carried through the subcutaneous tissues and fascia to expose the axillary vein. There was a duplicated system and both were of similar caliber. The nerves were protected.  The vein was approximately 4 mm in diameter. Next, a subcutaneous tunnel was created connecting the upper arm to the lower arm incision in an arcing configuration over the biceps muscle.  A 4-7 mm PTFE graft was then brought through this subcutaneous tunnel. The patient was given 10000 units of intravenous heparin. After appropriate circulation time, the vessel loops were used to control the artery. A longitudinal opening was made in the left brachial artery.  The 4 mm end of the graft was beveled and sewn end to side to the artery using a 6 0 prolene.  At completion of the anastomosis the artery was forward bled, backbled and thoroughly flushed.  The anastomosis was secured, vessel loops were released and there was palpable pulse in the graft.  The graft was clamped just above the arterial anastomosis with a fistula clamp. The graft was then pulled taut to length at the axillary incision.  The axillary vein was controlled with a fine bulldog clamp proximally and distally in the upper  axilla as well as one large side branch.  The vein was opened longitudinally.  The distal end of the graft was then beveled and sewn end to side to the vein using a running 6 0 prolene.  Just prior to completion of the anastomosis, everything was forward bled, back bled and thoroughly flushed.  The anastomosis was secured and the fistula clamp removed from the proximal graft.  A thrill was immediately palpable in the graft. The patient was given 100 mg of protamine to assist with hemostasis.  After hemostasis was obtained, the subcutaneous tissues were reapproximated using a running 3-0 Vicryl suture. The skin was then closed with a 4 0 Vicryl subcuticular stitch. Dermabond was applied to the skin incisions.  The patient tolerated the procedure well and there were no complications.  Instrument sponge and needle count was correct at the end of the case.  The patient was taken to the recovery room in stable condition. The patient had audible radial and ulnar doppler signals at the end of the case.  The radial doppler augmented approximately 50% with clamping the graft.  Ruta Hinds, MD [facility] Office: [phone] Pager: [phone]

## 2013-12-09 NOTE — Anesthesia Preprocedure Evaluation (Signed)
Anesthesia Evaluation  Patient identified by MRN, date of birth, ID band Patient awake    Reviewed: Allergy & Precautions, H&P , NPO status , Patient's Chart, lab work & pertinent test results  Airway Mallampati: I  TM Distance: >3 FB Neck ROM: Full    Dental   Pulmonary          Cardiovascular hypertension, Pt. on medications + angina with exertion     Neuro/Psych    GI/Hepatic GERD-  Medicated and Controlled,  Endo/Other    Renal/GU CRFRenal disease     Musculoskeletal   Abdominal   Peds  Hematology   Anesthesia Other Findings   Reproductive/Obstetrics                             Anesthesia Physical Anesthesia Plan  ASA: III  Anesthesia Plan: MAC   Post-op Pain Management:    Induction: Intravenous  Airway Management Planned: Natural Airway  Additional Equipment:   Intra-op Plan:   Post-operative Plan:   Informed Consent: I have reviewed the patients History and Physical, chart, labs and discussed the procedure including the risks, benefits and alternatives for the proposed anesthesia with the patient or authorized representative who has indicated his/her understanding and acceptance.     Plan Discussed with: CRNA and Surgeon  Anesthesia Plan Comments:         Anesthesia Quick Evaluation

## 2013-12-09 NOTE — Progress Notes (Signed)
Called to PACU with Karen [name] complaining of chest pain. No other symptoms VSS. She has a history of angina. Will get EKG and give some sl Nitroglycerine. Dr Orene Desanctis will follow her.  Lillia Abed MD

## 2013-12-09 NOTE — Addendum Note (Signed)
Addendum  created 12/09/13 1812 by Lillia Abed, MD   Modules edited: Clinical Notes   Clinical Notes:  File: [id number]

## 2013-12-09 NOTE — H&P (View-Only) (Signed)
[facility] [place] HISTORY AND PHYSICAL   History of Present Illness:  Patient is a 58 y.o. year old female who presents for placement of a permanent hemodialysis access. The patient is right handed.  The patient is not currently on hemodialysis.  The cause of renal failure is thought to be secondary to hypertension.  She was previously seen by my partner Dr. Donnetta Hutching in 2013. At that time ultrasound showed veins too small for fistula creation.Other chronic medical problems include chronic kidney disease currently trying to get on the kidney transplant list as well as reflux and arthritis. These are currently stable..  Past Medical History  Diagnosis Date  . Chronic kidney disease     trying to get on kidney transplant list at [facility]  . Hypertension   . Chest pain   . Arthritis   . GERD (gastroesophageal reflux disease)     Past Surgical History  Procedure Laterality Date  . Rotator cuff repair Right 1997  . Cholecystectomy  2005  . Abdominal hysterectomy    . Knee arthroscopy Bilateral   . Bunionectomy Bilateral      Social History History  Substance Use Topics  . Smoking status: Never Smoker   . Smokeless tobacco: Never Used  . Alcohol Use: No    Family History Family History  Problem Relation Age of Onset  . Diabetes Sister   . Heart attack Father   . Hyperlipidemia Sister   . Hypertension Sister   . Kidney disease Father   . Kidney disease Mother   . Kidney disease Sister     x2    Allergies  No Known Allergies   Current Outpatient Prescriptions  Medication Sig Dispense Refill  . acetaminophen (TYLENOL) 500 MG tablet Take 500 mg by mouth every 6 (six) hours as needed for mild pain, moderate pain or headache.     Marland Kitchen amLODipine (NORVASC) 10 MG tablet Take 10 mg by mouth daily.    . Fe Fum-FePoly-Vit C-Vit B3 (INTEGRA PO) Take 1 tablet by mouth daily.     . furosemide (LASIX) 20 MG tablet Take 20 mg by mouth daily.    . isosorbide  mononitrate (IMDUR) 30 MG 24 hr tablet Take 1 tablet (30 mg total) by mouth daily. 90 tablet 3  . ondansetron (ZOFRAN-ODT) 4 MG disintegrating tablet TAKE 1 TABLET BY MOUTH EVERY 8 HOURS AS NEEDED FOR NAUSEA OR VOMITING. 20 tablet 1  . pantoprazole (PROTONIX) 40 MG tablet Take 1 tablet (40 mg total) by mouth daily. 30 tablet 11  . paricalcitol (ZEMPLAR) 1 MCG capsule Take 1 mcg by mouth daily.    . polyethylene glycol (MIRALAX / GLYCOLAX) packet Take 17 g by mouth daily as needed for mild constipation.     No current facility-administered medications for this visit.    ROS:   General:  No weight loss, Fever, chills  HEENT: No recent headaches, no nasal bleeding, no visual changes, no sore throat  Neurologic: No dizziness, blackouts, seizures. No recent symptoms of stroke or mini- stroke. No recent episodes of slurred speech, or temporary blindness.  Cardiac: No recent episodes of chest pain/pressure, no shortness of breath at rest.  + shortness of breath with exertion.  Denies history of atrial fibrillation or irregular heartbeat  Vascular: No history of rest pain in feet.  No history of claudication.  No history of non-healing ulcer, No history of DVT   Pulmonary: No home oxygen, no productive cough, no hemoptysis,  No  asthma or wheezing  Musculoskeletal:  [ ]  Arthritis, [ ]  Low back pain,  [ ]  Joint pain  Hematologic:No history of hypercoagulable state.  No history of easy bleeding.  No history of anemia  Gastrointestinal: No hematochezia or melena,  No gastroesophageal reflux, no trouble swallowing  Urinary: [x ] chronic Kidney disease, [ ]  on HD - [ ]  MWF or [ ]  TTHS, [ ]  Burning with urination, [ ]  Frequent urination, [ ]  Difficulty urinating;   Skin: No rashes  Psychological: No history of anxiety,  No history of depression   Physical Examination  Filed Vitals:   11/27/13 1306  BP: 132/60  Pulse: 74  Temp: 97.4 F (36.3 C)  TempSrc: Oral  Resp: 18  Height: 5\' 1"   (1.549 m)  Weight: 208 lb (94.348 kg)  SpO2: 100%    Body mass index is 39.32 kg/(m^2).  General:  Alert and oriented, no acute distress HEENT: Normal Neck: No bruit or JVD Pulmonary: Clear to auscultation bilaterally Cardiac: Regular Rate and Rhythm without murmur Gastrointestinal: Soft, non-tender, non-distended, no mass Skin: No rash Extremity Pulses:  absentradial, 2+brachial pulses bilaterally Musculoskeletal: No deformity or edema  Neurologic: Upper and lower extremity motor 5/5 and symmetric  DATA: prior vein mapping ultrasound was reviewed which shows small an adequate basilic and cephalic veins bilaterally. Arterial duplex exam from today reveals a small radial artery bilaterally but patent. The brachial artery was patent and 4 mm in diameter. I reviewed and interpreted the arterial study today.   ASSESSMENT: patient needs long-term hemodialysis access. She is approaching need for hemodialysis probably by first of the year according to her. She is not a candidate for a fistula. We will place a left arm AV graft on 12/09/2013. Risks benefits possible, location to procedure details were explained the patient today including not limited to bleeding infection graft thrombosis ischemic steal. She understands and agrees to proceed.   PLAN:see above  Ruta Hinds, MD [facility] [place] Office: [phone] Pager: [phone]

## 2013-12-09 NOTE — Progress Notes (Signed)
Pt in PACU awake and alert Some tingling and coolness in left hand + thrill in graft  She will call the office if this does not improve next 24-48 hours  Karen Hinds, MD [facility] [place]: [phone] Pager: [phone]

## 2013-12-09 NOTE — Interval H&P Note (Signed)
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 After consideration of risks, benefits and other options for treatment, the patient has consented to  Procedure(s): INSERTION OF ARTERIOVENOUS (AV) GORE-TEX GRAFT ARM (Left) as a surgical intervention .  The patient's history has been reviewed, patient examined, no change in status, stable for surgery.  I have reviewed the patient's chart and labs.  Questions were answered to the patient's satisfaction.     Elfego Giammarino E

## 2013-12-09 NOTE — Transfer of Care (Signed)
Immediate Anesthesia Transfer of Care Note  Patient: Karen [name]  Procedure(s) Performed: Procedure(s): INSERTION OF ARTERIOVENOUS (AV) GORE-TEX GRAFT ARM (Left)  Patient Location: PACU  Anesthesia Type:General  Level of Consciousness: awake and alert   Airway & Oxygen Therapy: Patient Spontanous Breathing and Patient connected to face mask oxygen  Post-op Assessment: Report given to PACU RN, Post -op Vital signs reviewed and stable and Patient moving all extremities  Post vital signs: Reviewed and stable  Complications: No apparent anesthesia complications

## 2013-12-09 NOTE — Anesthesia Procedure Notes (Signed)
Date/Time: 12/09/2013 1:45 PM Performed by: Babs Bertin Pre-anesthesia Checklist: Patient identified, Emergency Drugs available, Suction available, Patient being monitored and Timeout performed Patient Re-evaluated:Patient Re-evaluated prior to inductionOxygen Delivery Method: Simple face mask

## 2013-12-09 NOTE — Telephone Encounter (Addendum)
-----   Message from Mena Goes, RN sent at 12/09/2013  4:54 PM EST ----- Regarding: Schedule   ----- Message -----    From: Alvia Grove, PA-C    Sent: 12/09/2013   3:13 PM      To: Vvs Charge Pool  S/p left AV graft 12/09/13  F/u with Dr. Oneida Alar in 2-4 weeks.  Thanks Kim  12/09/13: lm for pt re appt, dpm

## 2013-12-10 ENCOUNTER — Encounter (HOSPITAL_COMMUNITY): Payer: Self-pay | Admitting: Vascular Surgery

## 2013-12-18 ENCOUNTER — Encounter: Payer: Self-pay | Admitting: Physician Assistant

## 2013-12-18 ENCOUNTER — Ambulatory Visit (INDEPENDENT_AMBULATORY_CARE_PROVIDER_SITE_OTHER): Payer: 59 | Admitting: Physician Assistant

## 2013-12-18 VITALS — BP 130/80 | HR 88 | Ht 61.0 in | Wt 213.5 lb

## 2013-12-18 DIAGNOSIS — R5383 Other fatigue: Secondary | ICD-10-CM

## 2013-12-18 NOTE — Patient Instructions (Signed)
Your physician recommends that you schedule a follow-up appointment in:6 months with Dr Berry 

## 2013-12-18 NOTE — Progress Notes (Signed)
Patient ID: Karen [name], female   DOB: [birth year], 58 y.o.   MRN: [id number]    Date:  12/18/2013   ID:  Karen [name], DOB Feb 16, 1955, MRN [id number]  PCP:  Elyn Peers, MD  Primary Cardiologist:  Gwenlyn Found    History of Present Illness: Karen [name] is a 58 y.o. obese female, followed by Dr. Gwenlyn Found, who was seen recently by Ellen Henri, PA-C for 6 month follow up and was complaining of fatigue.  She scheduled a 2-D echocardiogram, TSH, two-week CardioNet monitor and CBC.  TSH was within normal limits. Hemoglobin is stable although mildly low.  2-D echocardiogram revealed an ejection fraction of 55-60% with normal wall motion. There was grade 1 diastolic dysfunction. Was no AI or MR Peak PA pressure was 24 mmHg.  she underwent with Dr. Oneida Alar.  Her past medical history is significant for hypertension as well as chronic kidney disease. She also has a history of atypical chest pain. In June 2014, she underwent a myocardial perfusion imaging study which was negative for ischemia. Systolic function at that time was normal with an estimated ejection fraction of 68%.  the patient presented today for two week follow-up evaluation.  She has not had an episode of fatigue like she did before. She does have intermittent chest pressure which usually last 2-3 minutes but has not occurred when she walks upstairs. There is no associated symptoms with it she does normally get short of breath with exertion.   The patient currently denies nausea, vomiting, fever, orthopnea, dizziness, PND, cough, congestion, abdominal pain, hematochezia, melena, lower extremity edema, claudication.  Wt Readings from Last 3 Encounters:  12/09/13 210 lb 11 oz (95.567 kg)  12/01/13 210 lb 11.2 oz (95.573 kg)  11/27/13 208 lb (94.348 kg)     Past Medical History  Diagnosis Date  . Chronic kidney disease     trying to get on kidney transplant list at [facility]  . Hypertension   . Chest pain   . GERD (gastroesophageal  reflux disease)   . Complication of anesthesia 2005    difficulty remembering for a while  . Heart murmur     new - Echo- 12/08/13  . Anginal pain     Dr Gwenlyn Found cardiologist  . Shortness of breath dyspnea     with exertion  . Constipation   . Arthritis     knees  . Anemia     Current Outpatient Prescriptions  Medication Sig Dispense Refill  . acetaminophen (TYLENOL) 500 MG tablet Take 500 mg by mouth every 6 (six) hours as needed for mild pain, moderate pain or headache.     Marland Kitchen amLODipine (NORVASC) 10 MG tablet Take 10 mg by mouth daily.    . Fe Fum-FePoly-Vit C-Vit B3 (INTEGRA PO) Take 1 tablet by mouth daily.     . furosemide (LASIX) 20 MG tablet Take 20 mg by mouth daily.    . isosorbide mononitrate (IMDUR) 30 MG 24 hr tablet Take 1 tablet (30 mg total) by mouth daily. 90 tablet 3  . ondansetron (ZOFRAN-ODT) 4 MG disintegrating tablet TAKE 1 TABLET BY MOUTH EVERY 8 HOURS AS NEEDED FOR NAUSEA OR VOMITING. 20 tablet 1  . oxyCODONE-acetaminophen (ROXICET) 5-325 MG per tablet Take 1 tablet by mouth every 6 (six) hours as needed for severe pain. 30 tablet 0  . pantoprazole (PROTONIX) 40 MG tablet Take 1 tablet (40 mg total) by mouth daily. 30 tablet 11  . paricalcitol (ZEMPLAR) 1 MCG capsule Take 1 mcg by  mouth daily.    . polyethylene glycol (MIRALAX / GLYCOLAX) packet Take 17 g by mouth daily as needed for mild constipation.     No current facility-administered medications for this visit.    Allergies:   No Known Allergies  Social History:  The patient  reports that she has never smoked. She has never used smokeless tobacco. She reports that she does not drink alcohol or use illicit drugs.   Family history:   Family History  Problem Relation Age of Onset  . Diabetes Sister   . Heart attack Father   . Hyperlipidemia Sister   . Hypertension Sister   . Kidney disease Father   . Kidney disease Mother   . Kidney disease Sister     x2    ROS:  Please see the history of present  illness.  All other systems reviewed and negative.   PHYSICAL EXAM: VS:  There were no vitals taken for this visit. Obese, well developed, in no acute distress HEENT: Pupils are equal round react to light accommodation extraocular movements are intact.  Neck: No cervical lymphadenopathy. Cardiac: Regular rate and rhythm with 1/6 sys murmur.  No rubs or gallops. Lungs:  clear to auscultation bilaterally, no wheezing, rhonchi or rales Ext: Trace right lower extremity edema.  2+ radial and dorsalis pedis pulses. Skin: warm and dry Neuro:  Grossly normal    ASSESSMENT AND PLAN:  1. Palpitations: episodes have been occurring intermittently for the past several weeks and associated with fatigue.  The patient reports that she's not had an episode since mid November when she saw Ms. Simmons in the clinic. Reviewed data from a [place] monitor from November 16 through November 28 she has had some mild tachycardia with a max heart rate of 111. We'll review the rest of the data when it comes in later today  2. Fatigue: 2-D echocardiogram showed some grade 1 diastolic dysfunction but otherwise normal.  Hemoglobin is just mildly low. TSH is within normal limits.  1. Hypertension: BP is well controlled. Continue amlodipine, Imdur and Lasix.  2. History of atypical chest pain: She reported intermittent episodes of chest pressure. It usually last 2-3 minutes but is not associated with explanted 11/24/2014ertion. It typically happens when she is just sitting there and dose not occur when going up stairs  3. Chronic kidney disease: followed by [facility]. Patient is nearing the need for hemodialysis.  left arm AV graft by Dr. Oneida Alar    follow-up with Dr. Gwenlyn Found in 6 months

## 2013-12-25 ENCOUNTER — Encounter: Payer: Self-pay | Admitting: *Deleted

## 2013-12-26 ENCOUNTER — Telehealth: Payer: Self-pay | Admitting: Cardiovascular Disease

## 2013-12-26 NOTE — Telephone Encounter (Signed)
Pt is returning [place]'s call from a couple of days ago. She thinks that it is in regards to some Echo results. Please call  Thanks

## 2013-12-31 ENCOUNTER — Encounter: Payer: Self-pay | Admitting: Vascular Surgery

## 2013-12-31 ENCOUNTER — Encounter (HOSPITAL_COMMUNITY)
Admission: RE | Admit: 2013-12-31 | Discharge: 2013-12-31 | Disposition: A | Discharge status: Home / Self Care | Payer: 59 | Source: Ambulatory Visit | Attending: Nephrology | Admitting: Nephrology

## 2013-12-31 DIAGNOSIS — N184 Chronic kidney disease, stage 4 (severe): Secondary | ICD-10-CM | POA: Insufficient documentation

## 2013-12-31 DIAGNOSIS — D631 Anemia in chronic kidney disease: Secondary | ICD-10-CM | POA: Diagnosis not present

## 2013-12-31 LAB — POCT HEMOGLOBIN-HEMACUE: HEMOGLOBIN: 9.6 g/dL — AB (ref 12.0–15.0)

## 2013-12-31 MED ORDER — DARBEPOETIN ALFA 60 MCG/0.3ML IJ SOSY
60.0000 ug | PREFILLED_SYRINGE | INTRAMUSCULAR | Status: DC
  Administered 2013-12-31: 60 ug via SUBCUTANEOUS

## 2013-12-31 MED ORDER — DARBEPOETIN ALFA 60 MCG/0.3ML IJ SOSY
PREFILLED_SYRINGE | INTRAMUSCULAR | Status: AC
  Filled 2013-12-31: qty 0.3

## 2014-01-01 ENCOUNTER — Ambulatory Visit (INDEPENDENT_AMBULATORY_CARE_PROVIDER_SITE_OTHER): Payer: 59 | Admitting: Vascular Surgery

## 2014-01-01 ENCOUNTER — Encounter: Payer: Self-pay | Admitting: Vascular Surgery

## 2014-01-01 VITALS — BP 124/77 | HR 85 | Ht 61.0 in | Wt 215.7 lb

## 2014-01-01 DIAGNOSIS — N184 Chronic kidney disease, stage 4 (severe): Secondary | ICD-10-CM

## 2014-01-01 NOTE — Progress Notes (Signed)
Patient is a 57 year old female who is status post placement of a left upper arm AV graft on November 24. She returns today for routine postoperative follow-up. She is experiencing some intermittent occasional numbness and tingling in her left hand. She also occasionally has some pain in her left hand after typing on a computer for long periods of time. She is currently not on hemodialysis. She does not have any ulcerations on her fingertips.  Physical exam:  Filed Vitals:   01/01/14 0910  BP: 124/77  Pulse: 85  Height: 5\' 1"  (1.549 m)  Weight: 215 lb 11.2 oz (97.841 kg)  SpO2: 100%    2+ left radial pulse audible bruit palpable thrill in graft both incisions well-healed  Assessment: Healing left upper arm AV graft graft should be ready for use after another week or 2. Patient does have mild steal symptoms.  Plan: Patient will call me if her steal symptoms worsen such as continuous numbness and tingling nonhealing ulcers aching in hand or loss of muscle strength. She was counseled today to by a stethoscope so that she can check a bruit in her graft daily. She will call us if she has any problems.  Ruta Hinds, MD [facility] [place] Office: [phone] Pager: [phone]

## 2014-01-13 ENCOUNTER — Telehealth: Payer: Self-pay | Admitting: *Deleted

## 2014-01-13 NOTE — Telephone Encounter (Signed)
Patient called complaining of soreness in left upper arm AVG site.  Patient denied any fever, warmness,swelling or drainage. Patient does state that at times she has some numbness in this area and slight redness.  I instructed the patient to elevate her arm on a pillow, to exercise her fingers and to place a warm compress on the area of concern. The patient voiced understanding of these instructions and is to notify us of any worsening symptoms.

## 2014-01-16 DIAGNOSIS — R079 Chest pain, unspecified: Secondary | ICD-10-CM

## 2014-01-16 DIAGNOSIS — R7989 Other specified abnormal findings of blood chemistry: Secondary | ICD-10-CM

## 2014-01-16 HISTORY — DX: Other specified abnormal findings of blood chemistry: R79.89

## 2014-01-16 HISTORY — DX: Chest pain, unspecified: R07.9

## 2014-01-19 ENCOUNTER — Ambulatory Visit (INDEPENDENT_AMBULATORY_CARE_PROVIDER_SITE_OTHER): Payer: 59 | Admitting: Physician Assistant

## 2014-01-19 ENCOUNTER — Encounter: Payer: Self-pay | Admitting: Physician Assistant

## 2014-01-19 VITALS — BP 138/76 | HR 95 | Ht 61.0 in | Wt 209.7 lb

## 2014-01-19 DIAGNOSIS — R079 Chest pain, unspecified: Secondary | ICD-10-CM

## 2014-01-19 MED ORDER — ISOSORBIDE MONONITRATE ER 60 MG PO TB24: 60.0000 mg | ORAL_TABLET | Freq: Every day | ORAL | Status: DC

## 2014-01-19 NOTE — Progress Notes (Signed)
Patient ID: Karen [name], female   DOB: [birth year], 59 y.o.   MRN: [id number]    Date:  01/19/2014   ID:  Karen [name], DOB [birth year], MRN [id number]  PCP:  Elyn Peers, MD  Primary Cardiologist:  BErry    History of Present Illness: Karen [name] is a 59 y.o. female obese female, followed by Dr. Gwenlyn Found, who was seen recently by Ellen Henri, PA-C for 6 month follow up and was complaining of fatigue. She scheduled a 2-D echocardiogram, TSH, two-week CardioNet monitor and CBC. TSH was within normal limits. Hemoglobin is stable although mildly low. 2-D echocardiogram revealed an ejection fraction of 55-60% with normal wall motion. There was grade 1 diastolic dysfunction. Was no AI or MR Peak PA pressure was 24 mmHg. she underwent AV graft placement with Dr. Oneida Alar. Her past medical history is significant for hypertension as well as chronic kidney disease. She also has a history of atypical chest pain. In June 2014, she underwent a myocardial perfusion imaging study which was negative for ischemia. Systolic function at that time was normal with an estimated ejection fraction of 68%.  2 weeks of CardioNet monitoring were unrevealing.  She presents for a follow-up after one month. She reports continued chest pressure. Last Wednesday she had a to upper 2 hour episode. Episodes last just a few minutes. The pain comes and goes. It is not necessarily associated with exertion. She has been short of breath which can be with walking or sitting.  She usually has some mild lower extremity edema.  The patient currently denies nausea, vomiting, fever, orthopnea, dizziness, PND, cough, congestion, abdominal pain, hematochezia, melena,  claudication.  Wt Readings from Last 3 Encounters:  01/19/14 209 lb 11.2 oz (95.119 kg)  01/01/14 215 lb 11.2 oz (97.841 kg)  12/18/13 213 lb 8 oz (96.843 kg)     Past Medical History  Diagnosis Date  . Chronic kidney disease     trying to get on kidney  transplant list at [facility]  . Hypertension   . Chest pain   . GERD (gastroesophageal reflux disease)   . Complication of anesthesia 2005    difficulty remembering for a while  . Heart murmur     new - Echo- 12/08/13  . Anginal pain     Dr Gwenlyn Found cardiologist  . Shortness of breath dyspnea     with exertion  . Constipation   . Arthritis     knees  . Anemia     Current Outpatient Prescriptions  Medication Sig Dispense Refill  . acetaminophen (TYLENOL) 500 MG tablet Take 500 mg by mouth every 6 (six) hours as needed for mild pain, moderate pain or headache.     Marland Kitchen amLODipine (NORVASC) 10 MG tablet Take 10 mg by mouth daily.    . Fe Fum-FePoly-Vit C-Vit B3 (INTEGRA PO) Take 1 tablet by mouth daily.     . furosemide (LASIX) 20 MG tablet Take 20 mg by mouth daily.    . isosorbide mononitrate (IMDUR) 60 MG 24 hr tablet Take 1 tablet (60 mg total) by mouth daily. 90 tablet 2  . ondansetron (ZOFRAN-ODT) 4 MG disintegrating tablet TAKE 1 TABLET BY MOUTH EVERY 8 HOURS AS NEEDED FOR NAUSEA OR VOMITING. 20 tablet 1  . oxyCODONE-acetaminophen (ROXICET) 5-325 MG per tablet Take 1 tablet by mouth every 6 (six) hours as needed for severe pain. 30 tablet 0  . pantoprazole (PROTONIX) 40 MG tablet Take 1 tablet (40 mg total) by mouth daily. 30 tablet  11  . paricalcitol (ZEMPLAR) 1 MCG capsule Take 1 mcg by mouth daily.    . polyethylene glycol (MIRALAX / GLYCOLAX) packet Take 17 g by mouth daily as needed for mild constipation.     No current facility-administered medications for this visit.    Allergies:   No Known Allergies  Social History:  The patient  reports that she has never smoked. She has never used smokeless tobacco. She reports that she does not drink alcohol or use illicit drugs.   Family history:   Family History  Problem Relation Age of Onset  . Diabetes Sister   . Heart attack Father   . Hyperlipidemia Sister   . Hypertension Sister   . Kidney disease Father   . Kidney  disease Mother   . Kidney disease Sister     x2    ROS:  Please see the history of present illness.  All other systems reviewed and negative.   PHYSICAL EXAM: VS:  BP 138/76 mmHg  Pulse 95  Ht 5\' 1"  (1.549 m)  Wt 209 lb 11.2 oz (95.119 kg)  BMI 39.64 kg/m2 Obese, well developed, in no acute distress HEENT: Pupils are equal round react to light accommodation extraocular movements are intact.  Neck: no JVDNo cervical lymphadenopathy. Cardiac: Regular rate and rhythm without murmurs rubs or gallops. Lungs:  clear to auscultation bilaterally, no wheezing, rhonchi or rales Ext: Trace lower extremity edema.  2+ radial and dorsalis pedis pulses. Skin: warm and dry Neuro:  Grossly normal  EKG:  Normal sinus rhythm rate 95 bpm inferior T-wave inversion  ASSESSMENT AND PLAN:  Problem List Items Addressed This Visit    Chest pain - Primary    She continues to complain of chest tightness. She says it comes and goes.   last Wednesday lasted 2 hours but sometimes it lasts only a few minutes.  it is not necessarily associated with exertion.  She had a graft put him back in November she had an episode while there and she was given nitroglycerin which she said eased the pain rather quickly.  Would increase her Imdur to 60 mg repeat a Lexiscan stress test.  Her echocardiogram was essentially normal.   2 weeks of CardioNet did not reveal anything significant. Her max heart rate appeared to be 111 bpm.    Relevant Orders      EKG 12-Lead      Myocardial Perfusion Imaging

## 2014-01-19 NOTE — Patient Instructions (Signed)
Lexiscan Myoview- this is a test that looks at the blood flow to your heart muscle.  It takes approximately 2 1/2 hours. Please follow instruction sheet, as given.  INCREASE your IMDUR to 60 MG, once a day.  Please follow up with Gaspar Bidding, PA-C or Brittainy, PA-C in 1 month.

## 2014-01-19 NOTE — Assessment & Plan Note (Signed)
She continues to complain of chest tightness. She says it comes and goes.   last Wednesday lasted 2 hours but sometimes it lasts only a few minutes.  it is not necessarily associated with exertion.  She had a graft put him back in November she had an episode while there and she was given nitroglycerin which she said eased the pain rather quickly.  Would increase her Imdur to 60 mg repeat a Lexiscan stress test.  Her echocardiogram was essentially normal.   2 weeks of CardioNet did not reveal anything significant. Her max heart rate appeared to be 111 bpm.

## 2014-01-21 ENCOUNTER — Telehealth (HOSPITAL_COMMUNITY): Payer: Self-pay

## 2014-01-21 NOTE — Telephone Encounter (Signed)
Encounter complete. 

## 2014-01-23 ENCOUNTER — Ambulatory Visit (HOSPITAL_COMMUNITY)
Admission: RE | Admit: 2014-01-23 | Discharge: 2014-01-23 | Disposition: A | Discharge status: Home / Self Care | Payer: 59 | Source: Ambulatory Visit | Attending: Cardiology | Admitting: Cardiology

## 2014-01-23 DIAGNOSIS — E669 Obesity, unspecified: Secondary | ICD-10-CM | POA: Diagnosis not present

## 2014-01-23 DIAGNOSIS — R002 Palpitations: Secondary | ICD-10-CM | POA: Diagnosis not present

## 2014-01-23 DIAGNOSIS — R0600 Dyspnea, unspecified: Secondary | ICD-10-CM | POA: Insufficient documentation

## 2014-01-23 DIAGNOSIS — R011 Cardiac murmur, unspecified: Secondary | ICD-10-CM | POA: Insufficient documentation

## 2014-01-23 DIAGNOSIS — I10 Essential (primary) hypertension: Secondary | ICD-10-CM | POA: Diagnosis not present

## 2014-01-23 DIAGNOSIS — R079 Chest pain, unspecified: Secondary | ICD-10-CM

## 2014-01-23 DIAGNOSIS — Z8249 Family history of ischemic heart disease and other diseases of the circulatory system: Secondary | ICD-10-CM | POA: Insufficient documentation

## 2014-01-23 MED ORDER — TECHNETIUM TC 99M SESTAMIBI GENERIC - CARDIOLITE
10.8000 | Freq: Once | INTRAVENOUS | Status: AC | PRN
  Administered 2014-01-23: 11 via INTRAVENOUS

## 2014-01-23 MED ORDER — TECHNETIUM TC 99M SESTAMIBI GENERIC - CARDIOLITE
30.6000 | Freq: Once | INTRAVENOUS | Status: AC | PRN
  Administered 2014-01-23: 31 via INTRAVENOUS

## 2014-01-23 MED ORDER — REGADENOSON 0.4 MG/5ML IV SOLN
0.4000 mg | Freq: Once | INTRAVENOUS | Status: AC
  Administered 2014-01-23: 0.4 mg via INTRAVENOUS

## 2014-01-23 MED ORDER — AMINOPHYLLINE 25 MG/ML IV SOLN
125.0000 mg | Freq: Once | INTRAVENOUS | Status: AC
  Administered 2014-01-23: 125 mg via INTRAVENOUS

## 2014-01-23 NOTE — Procedures (Addendum)
[facility] [address] [place] [address] [id number]  Cardiology Nuclear Med Study  Karen [name] is a 59 y.o. female     MRN : [id number]     DOB: 03-17-1955  Procedure Date: 01/23/2014  Nuclear Med Background Indication for Stress Test:  Evaluation for Ischemia History:  cardiac murmur;Last NUC MPI on 06/18/2012-normal;EF=68% Cardiac Risk Factors: Family History - CAD, Hypertension, Lipids and Obesity  Symptoms:  Chest Pain, DOE, Fatigue, Nausea and Palpitations   Nuclear Pre-Procedure Caffeine/Decaff Intake:  12:00am NPO After: 10am   IV Site: R Forearm  IV 0.9% NS with Angio Cath:  22g  Chest Size (in):  n/a IV Started by: Azucena Cecil, RN  Height: 5\' 1"  (1.549 m)  Cup Size: D  BMI:  Body mass index is 39.51 kg/(m^2). Weight:  209 lb (94.802 kg)   Tech Comments:  n/a    Nuclear Med Study 1 or 2 day study: 1 day  Stress Test Type:  [place] Provider:  Quay Burow, MD   Resting Radionuclide: Technetium 45m Sestamibi  Resting Radionuclide Dose: 10.8 mCi   Stress Radionuclide:  Technetium 28m Sestamibi  Stress Radionuclide Dose: 30.6 mCi           Stress Protocol Rest HR: 86 Stress HR: 106  Rest BP: 137/71 Stress BP:156/62  Exercise Time (min): n/a METS: n/a          Dose of Adenosine (mg):  n/a Dose of Lexiscan: 0.4 mg  Dose of Atropine (mg): n/a Dose of Dobutamine: n/a mcg/kg/min (at max HR)  Stress Test Technologist: Mellody Memos, CCT Nuclear Technologist: Imagene Riches, CNMT   Rest Procedure:  Myocardial perfusion imaging was performed at rest 45 minutes following the intravenous administration of Technetium 37m Sestamibi. Stress Procedure:  The patient received IV Lexiscan 0.4 mg over 15-seconds.  Technetium 43m Sestamibi injected IV at 30-seconds.  Patient experienced shortness of breath, stomach pains, headache and nausea. She was administered 125 mg of Aminophylline IV.  There were no significant changes with Lexiscan.  Quantitative spect images were obtained after a 45 minute delay.  Transient Ischemic Dilatation (Normal <1.22):  0.98  QGS EDV:  104 ml QGS ESV:  42 ml LV Ejection Fraction: 59%    Rest ECG: NSR - Normal EKG  Stress ECG: No significant ST segment change suggestive of ischemia.  QPS Raw Data Images:  Acquisition technically good; normal left ventricular size. Stress Images:  There is decreased uptake in the apex. Rest Images:  There is decreased uptake in the apex, less prominent compared to the stress images. Subtraction (SDS):  These findings are consistent with apical thinning and mild ischemia.  Impression Exercise Capacity:  Lexiscan with no exercise. BP Response:  Normal blood pressure response. Clinical Symptoms:  There is dyspnea. ECG Impression:  No significant ST segment change suggestive of ischemia. Comparison with Prior Nuclear Study: Compared to 06/19/12, apical ischemia is new.  Overall Impression:  Low risk stress nuclear study with a small, severe intensity, partially reversible apical defect consistent with thinning and mild ischemia.  LV Wall Motion:  NL LV Function; NL Wall Motion   Kirk Ruths, MD  01/23/2014 5:18 PM

## 2014-01-28 ENCOUNTER — Encounter (HOSPITAL_COMMUNITY): Payer: 59

## 2014-01-28 ENCOUNTER — Encounter (HOSPITAL_COMMUNITY)
Admission: RE | Admit: 2014-01-28 | Discharge: 2014-01-28 | Disposition: A | Discharge status: Home / Self Care | Payer: 59 | Source: Ambulatory Visit | Attending: Nephrology | Admitting: Nephrology

## 2014-01-28 DIAGNOSIS — N184 Chronic kidney disease, stage 4 (severe): Secondary | ICD-10-CM | POA: Insufficient documentation

## 2014-01-28 DIAGNOSIS — D631 Anemia in chronic kidney disease: Secondary | ICD-10-CM | POA: Diagnosis present

## 2014-01-28 LAB — IRON AND TIBC
Iron: 55 ug/dL (ref 42–145)
Saturation Ratios: 28 % (ref 20–55)
TIBC: 197 ug/dL — ABNORMAL LOW (ref 250–470)
UIBC: 142 ug/dL (ref 125–400)

## 2014-01-28 LAB — FERRITIN: Ferritin: 418 ng/mL — ABNORMAL HIGH (ref 10–291)

## 2014-01-28 LAB — POCT HEMOGLOBIN-HEMACUE: Hemoglobin: 10.5 g/dL — ABNORMAL LOW (ref 12.0–15.0)

## 2014-01-28 MED ORDER — DARBEPOETIN ALFA 60 MCG/0.3ML IJ SOSY
PREFILLED_SYRINGE | INTRAMUSCULAR | Status: AC
  Filled 2014-01-28: qty 0.3

## 2014-01-28 MED ORDER — DARBEPOETIN ALFA 60 MCG/0.3ML IJ SOSY
60.0000 ug | PREFILLED_SYRINGE | INTRAMUSCULAR | Status: DC
  Administered 2014-01-28: 60 ug via SUBCUTANEOUS

## 2014-01-29 LAB — PTH, INTACT AND CALCIUM
Calcium, Total (PTH): 9 mg/dL (ref 8.7–10.2)
PTH: 179 pg/mL — AB (ref 15–65)

## 2014-01-30 ENCOUNTER — Telehealth: Payer: Self-pay | Admitting: Cardiovascular Disease

## 2014-01-30 NOTE — Telephone Encounter (Signed)
Pt is calling Karen [name] back in regards to her results from her echo

## 2014-01-30 NOTE — Telephone Encounter (Signed)
Spoke with patient and informed her of stress test results - Curt Bears had LM to call back regarding this. She states imdur increased dose has not helped any. She will keep follow up on 2/9. She will notify our office if she has issue between now and then

## 2014-01-30 NOTE — Telephone Encounter (Signed)
Returning your call from yesterday. If you do not get her on cell,please call at [phone].

## 2014-01-31 ENCOUNTER — Other Ambulatory Visit: Payer: Self-pay | Admitting: Internal Medicine

## 2014-02-24 ENCOUNTER — Encounter: Payer: Self-pay | Admitting: Physician Assistant

## 2014-02-24 ENCOUNTER — Ambulatory Visit (INDEPENDENT_AMBULATORY_CARE_PROVIDER_SITE_OTHER): Payer: 59 | Admitting: Physician Assistant

## 2014-02-24 VITALS — BP 140/70 | HR 92 | Ht 61.0 in | Wt 217.6 lb

## 2014-02-24 DIAGNOSIS — N185 Chronic kidney disease, stage 5: Secondary | ICD-10-CM

## 2014-02-24 DIAGNOSIS — R079 Chest pain, unspecified: Secondary | ICD-10-CM

## 2014-02-24 DIAGNOSIS — I5033 Acute on chronic diastolic (congestive) heart failure: Secondary | ICD-10-CM

## 2014-02-24 NOTE — Assessment & Plan Note (Signed)
Status post Lexiscan which was low risk with mild apical ischemia.  The chest pressure does she describes as when she lays down at night. At that point, she has difficulty breathing. This seems more like orthopnea to me.

## 2014-02-24 NOTE — Patient Instructions (Addendum)
Monitor your weight every morning.  If you gain 3 pounds in 24 hours, or 5 pounds in a week, call the office for instructions.   INCREASE Furosemide to 40mg  daily.  Your physician recommends that you return for lab work in: TODAY at [facility] on the first floor.  Your physician recommends that you return for lab work in: Friday at [facility].  Your physician recommends that you schedule a follow-up appointment in: 2 weeks with Tarri Fuller, PA or Ellen Henri, [name].   Your physician recommends that you weigh, daily, at the same time every day, and in the same amount of clothing. Please record your daily weights on the handout provided and bring it to your next appointment.   Low-Sodium Eating Plan Sodium raises blood pressure and causes water to be held in the body. Getting less sodium from food will help lower your blood pressure, reduce any swelling, and protect your heart, liver, and kidneys. We get sodium by adding salt (sodium chloride) to food. Most of our sodium comes from canned, boxed, and frozen foods. Restaurant foods, fast foods, and pizza are also very high in sodium. Even if you take medicine to lower your blood pressure or to reduce fluid in your body, getting less sodium from your food is important. WHAT IS MY PLAN? Most people should limit their sodium intake to 2,300 mg a day. Your health care provider recommends that you limit your sodium intake to __1800________ a day.  WHAT DO I NEED TO KNOW ABOUT THIS EATING PLAN? For the low-sodium eating plan, you will follow these general guidelines:  Choose foods with a % Daily Value for sodium of less than 5% (as listed on the food label).   Use salt-free seasonings or herbs instead of table salt or sea salt.   Check with your health care provider or pharmacist before using salt substitutes.   Eat fresh foods.  Eat more vegetables and fruits.  Limit canned vegetables. If you do use them, rinse them well to decrease the  sodium.   Limit cheese to 1 oz (28 g) per day.   Eat lower-sodium products, often labeled as "lower sodium" or "no salt added."  Avoid foods that contain monosodium glutamate (MSG). MSG is sometimes added to [place] food and some canned foods.  Check food labels (Nutrition Facts labels) on foods to learn how much sodium is in one serving.  Eat more home-cooked food and less restaurant, buffet, and fast food.  When eating at a restaurant, ask that your food be prepared with less salt or none, if possible.  HOW DO I READ FOOD LABELS FOR SODIUM INFORMATION? The Nutrition Facts label lists the amount of sodium in one serving of the food. If you eat more than one serving, you must multiply the listed amount of sodium by the number of servings. Food labels may also identify foods as:  Sodium free--Less than 5 mg in a serving.  Very low sodium--35 mg or less in a serving.  Low sodium--140 mg or less in a serving.  Light in sodium--50% less sodium in a serving. For example, if a food that usually has 300 mg of sodium is changed to become light in sodium, it will have 150 mg of sodium.  Reduced sodium--25% less sodium in a serving. For example, if a food that usually has 400 mg of sodium is changed to reduced sodium, it will have 300 mg of sodium. WHAT FOODS CAN I EAT? Grains Low-sodium cereals, including  oats, puffed wheat and rice, and shredded wheat cereals. Low-sodium crackers. Unsalted rice and pasta. Lower-sodium bread.  Vegetables Frozen or fresh vegetables. Low-sodium or reduced-sodium canned vegetables. Low-sodium or reduced-sodium tomato sauce and paste. Low-sodium or reduced-sodium tomato and vegetable juices.  Fruits Fresh, frozen, and canned fruit. Fruit juice.  Meat and Other Protein Products Low-sodium canned tuna and salmon. Fresh or frozen meat, poultry, seafood, and fish. Lamb. Unsalted nuts. Dried beans, peas, and lentils without added salt. Unsalted canned  beans. Homemade soups without salt. Eggs.  Dairy Milk. Soy milk. Ricotta cheese. Low-sodium or reduced-sodium cheeses. Yogurt.  Condiments Fresh and dried herbs and spices. Salt-free seasonings. Onion and garlic powders. Low-sodium varieties of mustard and ketchup. Lemon juice.  Fats and Oils Reduced-sodium salad dressings. Unsalted butter.  Other Unsalted popcorn and pretzels.  The items listed above may not be a complete list of recommended foods or beverages. Contact your dietitian for more options. WHAT FOODS ARE NOT RECOMMENDED? Grains Instant hot cereals. Bread stuffing, pancake, and biscuit mixes. Croutons. Seasoned rice or pasta mixes. Noodle soup cups. Boxed or frozen macaroni and cheese. Self-rising flour. Regular salted crackers. Vegetables Regular canned vegetables. Regular canned tomato sauce and paste. Regular tomato and vegetable juices. Frozen vegetables in sauces. Salted french fries. Olives. Angie Fava. Relishes. Sauerkraut. Salsa. Meat and Other Protein Products Salted, canned, smoked, spiced, or pickled meats, seafood, or fish. Bacon, ham, sausage, hot dogs, corned beef, chipped beef, and packaged luncheon meats. Salt pork. Jerky. Pickled herring. Anchovies, regular canned tuna, and sardines. Salted nuts. Dairy Processed cheese and cheese spreads. Cheese curds. Blue cheese and cottage cheese. Buttermilk.  Condiments Onion and garlic salt, seasoned salt, table salt, and sea salt. Canned and packaged gravies. Worcestershire sauce. Tartar sauce. Barbecue sauce. Teriyaki sauce. Soy sauce, including reduced sodium. Steak sauce. Fish sauce. Oyster sauce. Cocktail sauce. Horseradish. Regular ketchup and mustard. Meat flavorings and tenderizers. Bouillon cubes. Hot sauce. Tabasco sauce. Marinades. Taco seasonings. Relishes. Fats and Oils Regular salad dressings. Salted butter. Margarine. Ghee. Bacon fat.  Other Potato and tortilla chips. Corn chips and puffs. Salted  popcorn and pretzels. Canned or dried soups. Pizza. Frozen entrees and pot pies.  The items listed above may not be a complete list of foods and beverages to avoid. Contact your dietitian for more information. Document Released: 06/24/2001 Document Revised: 01/07/2013 Document Reviewed: 11/06/2012 [facility] Patient Information 2015 [place], [place]. This information is not intended to replace advice given to you by your health care provider. Make sure you discuss any questions you have with your health care provider.

## 2014-02-24 NOTE — Assessment & Plan Note (Addendum)
The patient has gained 8 pounds in the last month, with worsening LEE, orthopnea and JVD.  Increase lasix to 40 mg daily.  BMET Friday.  Will get recent BMET results from Dr. Meredeth Ide.   Will hold off on daily K+ until I see the BMET results Friday.  Daily weight monitoring. We discussed low sodium diet.   Monitor your weight every morning.  If she gains 3 pounds in 24 hours, or 5 pounds in a week, call the office for instructions.   With her CKD she may need significant for diuretic.  Follow up in two weeks.

## 2014-02-24 NOTE — Progress Notes (Signed)
Patient ID: Karen [name], female   DOB: Nov 06, 1955, 59 y.o.   MRN: [id number]    Date:  02/24/2014   ID:  Karen [name], DOB 10/14/1955, MRN [id number]  PCP:  Elyn Peers, MD   Primary Cardiologist:  Gwenlyn Found Chief Complaint  Patient presents with  . Follow-up    Nuclear stress test results.  Has chest pressure with pounding heart rate which occurs with minimal activity with dyspnea.  Bilateral ankle edema all the time.  No dizziness.     History of Present Illness: Karen [name] is a 59 y.o. female  Karen [name] is a 59 y.o. female obese female, followed by Dr. Gwenlyn Found, who was seen recently by Ellen Henri, PA-C for 6 month follow up and was complaining of fatigue. She scheduled a 2-D echocardiogram, TSH, two-week CardioNet monitor and CBC. TSH was within normal limits. Hemoglobin is stable although mildly low. 2-D echocardiogram revealed an ejection fraction of 55-60% with normal wall motion. There was grade 1 diastolic dysfunction. Was no AI or MR Peak PA pressure was 24 mmHg. she underwent AV graft placement with Dr. Oneida Alar. Her past medical history is significant for hypertension as well as chronic kidney disease. She also has a history of atypical chest pain. In June 2014, she underwent a myocardial perfusion imaging study which was negative for ischemia. Systolic function at that time was normal with an estimated ejection fraction of 68%. 2 weeks of CardioNet monitoring were unrevealing.  Patient underwent nuclear stress testing recently which was considered low risk with mild apical thinning and ischemia.  She presents today for follow-up. She has gained 8 pounds since her last visit 1 month ago. She describes chest pressure, worse when she lays down as well as orthopnea. Her lower extremity edema has worsened.   The patient currently denies nausea, vomiting, fever, dizziness, PND, cough, congestion, abdominal pain, hematochezia, melena, claudication.  Wt Readings from  Last 3 Encounters:  02/24/14 217 lb 9.6 oz (98.703 kg)  01/23/14 209 lb (94.802 kg)  01/19/14 209 lb 11.2 oz (95.119 kg)     Past Medical History  Diagnosis Date  . Chronic kidney disease     trying to get on kidney transplant list at [facility]  . Hypertension   . Chest pain   . GERD (gastroesophageal reflux disease)   . Complication of anesthesia 2005    difficulty remembering for a while  . Heart murmur     new - Echo- 12/08/13  . Anginal pain     Dr Gwenlyn Found cardiologist  . Shortness of breath dyspnea     with exertion  . Constipation   . Arthritis     knees  . Anemia     Current Outpatient Prescriptions  Medication Sig Dispense Refill  . acetaminophen (TYLENOL) 500 MG tablet Take 500 mg by mouth every 6 (six) hours as needed for mild pain, moderate pain or headache.     Marland Kitchen amLODipine (NORVASC) 10 MG tablet Take 10 mg by mouth daily.    . Fe Fum-FePoly-Vit C-Vit B3 (INTEGRA PO) Take 1 tablet by mouth daily.     . furosemide (LASIX) 20 MG tablet Take 40 mg by mouth daily.    . isosorbide mononitrate (IMDUR) 60 MG 24 hr tablet Take 1 tablet (60 mg total) by mouth daily. 90 tablet 2  . ondansetron (ZOFRAN-ODT) 4 MG disintegrating tablet TAKE 1 TABLET BY MOUTH EVERY 8 HOURS AS NEEDED FOR NAUSEA OR VOMITING. 20 tablet 1  . oxyCODONE-acetaminophen (ROXICET) 5-325  MG per tablet Take 1 tablet by mouth every 6 (six) hours as needed for severe pain. 30 tablet 0  . pantoprazole (PROTONIX) 40 MG tablet Take 1 tablet (40 mg total) by mouth daily. 30 tablet 11  . paricalcitol (ZEMPLAR) 1 MCG capsule Take 1 mcg by mouth daily.    . polyethylene glycol (MIRALAX / GLYCOLAX) packet Take 17 g by mouth daily as needed for mild constipation.     No current facility-administered medications for this visit.    Allergies:   No Known Allergies  Social History:  The patient  reports that she has never smoked. She has never used smokeless tobacco. She reports that she does not drink alcohol or use  illicit drugs.   Family history:   Family History  Problem Relation Age of Onset  . Diabetes Sister   . Heart attack Father   . Hyperlipidemia Sister   . Hypertension Sister   . Kidney disease Father   . Kidney disease Mother   . Kidney disease Sister     x2    ROS:  Please see the history of present illness.  All other systems reviewed and negative.   PHYSICAL EXAM: VS:  BP 140/70 mmHg  Pulse 92  Ht 5\' 1"  (1.549 m)  Wt 217 lb 9.6 oz (98.703 kg)  BMI 41.14 kg/m2 Obese, well developed, in no acute distress HEENT: Pupils are equal round react to light accommodation extraocular movements are intact.  Neck: Elevated JVDNo cervical lymphadenopathy. Cardiac: Regular rate and rhythm without murmurs rubs or gallops. Lungs:  clear to auscultation bilaterally, no wheezing, rhonchi or rales Abd: soft, nontender, positive bowel sounds all quadrants, no hepatosplenomegaly Ext: 2+ lower extremity edema.  2+ radial and dorsalis pedis pulses. Skin: warm and dry Neuro:  Grossly normal  EKG:  Normal sinus rhythm rate 92 bpm    ASSESSMENT AND PLAN:  Problem List Items Addressed This Visit    Chronic kidney disease, stage V   Relevant Orders   Brain natriuretic peptide   Basic metabolic panel   Chest pain - Primary    Status post Lexiscan which was low risk with mild apical ischemia.  The chest pressure does she describes as when she lays down at night. At that point, she has difficulty breathing. This seems more like orthopnea to me.      Relevant Orders   EKG 12-Lead   Brain natriuretic peptide   Basic metabolic panel   Acute on chronic diastolic heart failure    The patient has gained 8 pounds in the last month, with worsening LEE, orthopnea and JVD.  Increase lasix to 40 mg daily.  BMET Friday.  Will get recent BMET results from Dr. Meredeth Ide.   Will hold off on daily K+ until I see the BMET results Friday.  Daily weight monitoring. We discussed low sodium diet.   Monitor your  weight every morning.  If she gains 3 pounds in 24 hours, or 5 pounds in a week, call the office for instructions.   With her CKD she may need significant for diuretic.  Follow up in two weeks.       Relevant Orders   Brain natriuretic peptide   Basic metabolic panel      Follow-up in 2 weeks.

## 2014-02-24 NOTE — Assessment & Plan Note (Signed)
BP mildly elevated.  Will see what happens with lasix

## 2014-02-25 ENCOUNTER — Encounter (HOSPITAL_COMMUNITY)
Admission: RE | Admit: 2014-02-25 | Discharge: 2014-02-25 | Disposition: A | Discharge status: Home / Self Care | Payer: 59 | Source: Ambulatory Visit | Attending: Nephrology | Admitting: Nephrology

## 2014-02-25 ENCOUNTER — Encounter: Payer: Self-pay | Admitting: Cardiovascular Disease

## 2014-02-25 DIAGNOSIS — N184 Chronic kidney disease, stage 4 (severe): Secondary | ICD-10-CM | POA: Insufficient documentation

## 2014-02-25 DIAGNOSIS — D631 Anemia in chronic kidney disease: Secondary | ICD-10-CM | POA: Diagnosis not present

## 2014-02-25 LAB — IRON AND TIBC
Iron: 63 ug/dL (ref 42–145)
SATURATION RATIOS: 28 % (ref 20–55)
TIBC: 228 ug/dL — AB (ref 250–470)
UIBC: 165 ug/dL (ref 125–400)

## 2014-02-25 LAB — FERRITIN: Ferritin: 503 ng/mL — ABNORMAL HIGH (ref 10–291)

## 2014-02-25 LAB — POCT HEMOGLOBIN-HEMACUE: HEMOGLOBIN: 11 g/dL — AB (ref 12.0–15.0)

## 2014-02-25 LAB — BRAIN NATRIURETIC PEPTIDE: BRAIN NATRIURETIC PEPTIDE: 148.1 pg/mL — AB (ref 0.0–100.0)

## 2014-02-25 MED ORDER — DARBEPOETIN ALFA 60 MCG/0.3ML IJ SOSY
PREFILLED_SYRINGE | INTRAMUSCULAR | Status: AC
  Filled 2014-02-25: qty 0.3

## 2014-02-25 MED ORDER — DARBEPOETIN ALFA 60 MCG/0.3ML IJ SOSY
60.0000 ug | PREFILLED_SYRINGE | INTRAMUSCULAR | Status: DC
  Administered 2014-02-25: 60 ug via SUBCUTANEOUS

## 2014-02-27 LAB — BASIC METABOLIC PANEL
BUN: 73 mg/dL — ABNORMAL HIGH (ref 6–23)
CO2: 20 meq/L (ref 19–32)
Calcium: 9.3 mg/dL (ref 8.4–10.5)
Chloride: 110 mEq/L (ref 96–112)
Creat: 5.71 mg/dL — ABNORMAL HIGH (ref 0.50–1.10)
Glucose, Bld: 85 mg/dL (ref 70–99)
POTASSIUM: 4.4 meq/L (ref 3.5–5.3)
SODIUM: 140 meq/L (ref 135–145)

## 2014-03-11 ENCOUNTER — Encounter: Payer: Self-pay | Admitting: Physician Assistant

## 2014-03-11 ENCOUNTER — Ambulatory Visit (INDEPENDENT_AMBULATORY_CARE_PROVIDER_SITE_OTHER): Payer: 59 | Admitting: Physician Assistant

## 2014-03-11 VITALS — BP 132/72 | HR 87 | Ht 61.0 in | Wt 211.0 lb

## 2014-03-11 DIAGNOSIS — N186 End stage renal disease: Secondary | ICD-10-CM

## 2014-03-11 DIAGNOSIS — R0789 Other chest pain: Secondary | ICD-10-CM

## 2014-03-11 DIAGNOSIS — I10 Essential (primary) hypertension: Secondary | ICD-10-CM

## 2014-03-11 DIAGNOSIS — I5033 Acute on chronic diastolic (congestive) heart failure: Secondary | ICD-10-CM

## 2014-03-11 NOTE — Patient Instructions (Addendum)
Your physician recommends that you schedule a follow-up appointment first available with Dr. Gwenlyn Found  Take your furosemide 40 mg as needed for  A 3 lb  Weight gain of 3 lbs in a 24 hr period or 5 lbs over one week  Increase your Protonix to two times a day for one week then back to your normal dose

## 2014-03-11 NOTE — Assessment & Plan Note (Signed)
Weight has decreased 6 pounds.  Continue lasix as needed and monitor weight.

## 2014-03-11 NOTE — Assessment & Plan Note (Addendum)
Chest pain is described as pressure and is located up near the manubrium area. She says it is intermittent and she does not occur every day.  It lasts about ten minutes.   When she was here last I increased her Imdur to 60 mg she doesn't think it made any difference. We'll try increasing her Protonix to twice daily for a week and see if that helps.  Recommend she see PCP and possible GI referral

## 2014-03-11 NOTE — Assessment & Plan Note (Signed)
No changes to meds

## 2014-03-11 NOTE — Progress Notes (Signed)
Patient ID: Karen [name], female   DOB: 13-Feb-1955, 59 y.o.   MRN: [id number]    Date:  03/11/2014   ID:  Karen [name], DOB [birth year], MRN [id number]  PCP:  Elyn Peers, MD  Primary Cardiologist:  Wyn Forster chief complaint on file.    History of Present Illness: [name] is a 59 y.o. female [name] is a 59 y.o. female obese female, followed by Dr. Gwenlyn Found, who was seen recently by Ellen Henri, PA-C for 6 month follow up and was complaining of fatigue. She scheduled a 2-D echocardiogram, TSH, two-week CardioNet monitor and CBC. TSH was within normal limits. Hemoglobin is stable although mildly low. 2-D echocardiogram revealed an ejection fraction of 55-60% with normal wall motion. There was grade 1 diastolic dysfunction. Was no AI or MR Peak PA pressure was 24 mmHg. she underwent AV graft placement with Dr. Oneida Alar. Her past medical history is significant for hypertension as well as chronic kidney disease. She also has a history of atypical chest pain. In June 2014, she underwent a myocardial perfusion imaging study which was negative for ischemia. Systolic function at that time was normal with an estimated ejection fraction of 68%. 2 weeks of CardioNet monitoring were unrevealing.  Patient underwent nuclear stress testing jan 2016 which was considered low risk with mild apical thinning and ischemia.When I saw her last she had gained 8 pounds and had been complaining of shortness of breath particularly when laying down.  We increased her Lasix and she's been monitoring her weight on a daily basis. She brought in a daily log showed that her weight has trended down she's also been taking the Lasix as an add on an as-needed basis without potassium.  She is not complaining of orthopnea at this time just has some mild lower extremity edema. She does continue to complain of chest pressure which is not on a daily basis The patient currently denies nausea, vomiting, fever,  shortness of breath, orthopnea, dizziness, PND, cough, congestion, abdominal pain, hematochezia, melena, lower extremity edema, claudication.  Wt Readings from Last 3 Encounters:  03/11/14 211 lb (95.709 kg)  02/24/14 217 lb 9.6 oz (98.703 kg)  01/23/14 209 lb (94.802 kg)     Past Medical History  Diagnosis Date  . Chronic kidney disease     trying to get on kidney transplant list at [facility]  . Hypertension   . Chest pain   . GERD (gastroesophageal reflux disease)   . Complication of anesthesia 2005    difficulty remembering for a while  . Heart murmur     new - Echo- 12/08/13  . Anginal pain     Dr Gwenlyn Found cardiologist  . Shortness of breath dyspnea     with exertion  . Constipation   . Arthritis     knees  . Anemia     Current Outpatient Prescriptions  Medication Sig Dispense Refill  . acetaminophen (TYLENOL) 500 MG tablet Take 500 mg by mouth every 6 (six) hours as needed for mild pain, moderate pain or headache.     Marland Kitchen amLODipine (NORVASC) 10 MG tablet Take 10 mg by mouth daily.    . Fe Fum-FePoly-Vit C-Vit B3 (INTEGRA PO) Take 1 tablet by mouth daily.     . furosemide (LASIX) 20 MG tablet Take 40 mg by mouth as needed.    . isosorbide mononitrate (IMDUR) 60 MG 24 hr tablet Take 1 tablet (60 mg total) by mouth daily. 90 tablet 2  . ondansetron (ZOFRAN-ODT)  4 MG disintegrating tablet TAKE 1 TABLET BY MOUTH EVERY 8 HOURS AS NEEDED FOR NAUSEA OR VOMITING. 20 tablet 1  . oxyCODONE-acetaminophen (ROXICET) 5-325 MG per tablet Take 1 tablet by mouth every 6 (six) hours as needed for severe pain. 30 tablet 0  . pantoprazole (PROTONIX) 40 MG tablet Take 1 tablet (40 mg total) by mouth daily. 30 tablet 11  . paricalcitol (ZEMPLAR) 1 MCG capsule Take 1 mcg by mouth daily.    . polyethylene glycol (MIRALAX / GLYCOLAX) packet Take 17 g by mouth daily as needed for mild constipation.     No current facility-administered medications for this visit.    Allergies:   No Known  Allergies  Social History:  The patient  reports that she has never smoked. She has never used smokeless tobacco. She reports that she does not drink alcohol or use illicit drugs.   Family history:   Family History  Problem Relation Age of Onset  . Diabetes Sister   . Heart attack Father   . Hyperlipidemia Sister   . Hypertension Sister   . Kidney disease Father   . Kidney disease Mother   . Kidney disease Sister     x2    ROS:  Please see the history of present illness.  All other systems reviewed and negative.   PHYSICAL EXAM: VS:  BP 132/72 mmHg  Pulse 87  Ht 5\' 1"  (1.549 m)  Wt 211 lb (95.709 kg)  BMI 39.89 kg/m2 Obese, well developed, in no acute distress HEENT: Pupils are equal round react to light accommodation extraocular movements are intact.  Neck: no JVDNo cervical lymphadenopathy. Cardiac: Regular rate and rhythm without murmurs rubs or gallops. Lungs:  clear to auscultation bilaterally, no wheezing, rhonchi or rales Abd: soft, nontender, positive bowel sounds all quadrants, no hepatosplenomegaly Ext: 1+ lower extremity edema.  2+ radial and dorsalis pedis pulses. Skin: warm and dry Neuro:  Grossly normal  EKG:  Normal sinus rhythm rate 87 bpm    ASSESSMENT AND PLAN:  Problem List Items Addressed This Visit    Essential hypertension - Primary    No changes to meds.      Relevant Orders   EKG 12-Lead   End stage renal disease   Chest pain    Chest pain is described as pressure and is located up near the manubrium area. She says it is intermittent and she does not occur every day.  It lasts about ten minutes.   When she was here last I increased her Imdur to 60 mg she doesn't think it made any difference. We'll try increasing her Protonix to twice daily for a week and see if that helps.  Recommend she see PCP and possible GI referral      Acute on chronic diastolic heart failure    Weight has decreased 6 pounds.  Continue lasix as needed and monitor  weight.

## 2014-03-16 ENCOUNTER — Telehealth: Payer: Self-pay | Admitting: Cardiovascular Disease

## 2014-03-16 NOTE — Telephone Encounter (Signed)
Received records from [facility] for appointment with Dr Gwenlyn Found on 04/28/14.  Records given to [facility] (medical records) for Dr Kennon Holter schedule on 04/28/14.  lp

## 2014-03-24 ENCOUNTER — Other Ambulatory Visit (HOSPITAL_COMMUNITY): Payer: Self-pay | Admitting: *Deleted

## 2014-03-25 ENCOUNTER — Encounter (HOSPITAL_COMMUNITY)
Admission: RE | Admit: 2014-03-25 | Discharge: 2014-03-25 | Disposition: A | Discharge status: Home / Self Care | Payer: 59 | Source: Ambulatory Visit | Attending: Nephrology | Admitting: Nephrology

## 2014-03-25 DIAGNOSIS — D631 Anemia in chronic kidney disease: Secondary | ICD-10-CM | POA: Insufficient documentation

## 2014-03-25 DIAGNOSIS — N184 Chronic kidney disease, stage 4 (severe): Secondary | ICD-10-CM | POA: Diagnosis not present

## 2014-03-25 LAB — IRON AND TIBC
IRON: 67 ug/dL (ref 42–145)
Saturation Ratios: 33 % (ref 20–55)
TIBC: 206 ug/dL — ABNORMAL LOW (ref 250–470)
UIBC: 139 ug/dL (ref 125–400)

## 2014-03-25 LAB — FERRITIN: FERRITIN: 456 ng/mL — AB (ref 10–291)

## 2014-03-25 LAB — POCT HEMOGLOBIN-HEMACUE: HEMOGLOBIN: 10.3 g/dL — AB (ref 12.0–15.0)

## 2014-03-25 MED ORDER — DARBEPOETIN ALFA 60 MCG/0.3ML IJ SOSY
PREFILLED_SYRINGE | INTRAMUSCULAR | Status: AC
  Filled 2014-03-25: qty 0.3

## 2014-03-25 MED ORDER — DARBEPOETIN ALFA 60 MCG/0.3ML IJ SOSY
60.0000 ug | PREFILLED_SYRINGE | INTRAMUSCULAR | Status: DC
  Administered 2014-03-25: 60 ug via SUBCUTANEOUS

## 2014-04-15 ENCOUNTER — Other Ambulatory Visit (HOSPITAL_COMMUNITY): Payer: Self-pay | Admitting: Urology

## 2014-04-15 DIAGNOSIS — D49519 Neoplasm of unspecified behavior of unspecified kidney: Secondary | ICD-10-CM

## 2014-04-17 DIAGNOSIS — N186 End stage renal disease: Secondary | ICD-10-CM

## 2014-04-17 HISTORY — DX: End stage renal disease: N18.6

## 2014-04-22 ENCOUNTER — Encounter (HOSPITAL_COMMUNITY): Payer: 59

## 2014-04-22 ENCOUNTER — Encounter (HOSPITAL_COMMUNITY)
Admission: RE | Admit: 2014-04-22 | Discharge: 2014-04-22 | Disposition: A | Discharge status: Home / Self Care | Payer: 59 | Source: Ambulatory Visit | Attending: Nephrology | Admitting: Nephrology

## 2014-04-22 DIAGNOSIS — D631 Anemia in chronic kidney disease: Secondary | ICD-10-CM | POA: Insufficient documentation

## 2014-04-22 DIAGNOSIS — N184 Chronic kidney disease, stage 4 (severe): Secondary | ICD-10-CM | POA: Diagnosis not present

## 2014-04-22 MED ORDER — DARBEPOETIN ALFA 60 MCG/0.3ML IJ SOSY
60.0000 ug | PREFILLED_SYRINGE | INTRAMUSCULAR | Status: DC
  Administered 2014-04-22: 60 ug via SUBCUTANEOUS

## 2014-04-22 MED ORDER — DARBEPOETIN ALFA 60 MCG/0.3ML IJ SOSY
PREFILLED_SYRINGE | INTRAMUSCULAR | Status: AC
  Filled 2014-04-22: qty 0.3

## 2014-04-28 ENCOUNTER — Ambulatory Visit (INDEPENDENT_AMBULATORY_CARE_PROVIDER_SITE_OTHER): Payer: 59 | Admitting: Cardiovascular Disease

## 2014-04-28 ENCOUNTER — Encounter: Payer: Self-pay | Admitting: Cardiovascular Disease

## 2014-04-28 VITALS — BP 110/70 | HR 82 | Ht 61.0 in | Wt 211.4 lb

## 2014-04-28 DIAGNOSIS — I10 Essential (primary) hypertension: Secondary | ICD-10-CM | POA: Diagnosis not present

## 2014-04-28 DIAGNOSIS — R600 Localized edema: Secondary | ICD-10-CM | POA: Diagnosis not present

## 2014-04-28 DIAGNOSIS — I5033 Acute on chronic diastolic (congestive) heart failure: Secondary | ICD-10-CM | POA: Diagnosis not present

## 2014-04-28 NOTE — Progress Notes (Signed)
04/28/2014 Oleh Genin   Jun 30, 1955  [id number]  Primary Physician Elyn Peers, MD Primary Cardiologist: Lorretta Harp MD Renae Gloss   HPI:   [name] is a 59 year old married African American female mother of 2, grandmother to 2 grandchildren who currently works as an Sales executive. I saw her 6 years ago in our office for evaluation of a fast heart rate which was worked up and ultimately resolved. I last saw her in the office 06/25/13.Her cardiac risk factor profile is notable for hypertension but otherwise are negative. For the last 3 weeks prior to her last office visit she had noticed substernal chest pain radiating to both upper extremities occurring on a daily basis. She does have reflux but she says the symptoms are different. I ordered a Myoview stress test which was entirely normal. I put her on a low-dose nitrate which resulted in mild improvement in her symptoms although she still complains of some chest pain since I saw her back her pain has decreased in frequency and severity.she has had an AV fistula fistula placed by Dr. Oneida Alar in anticipation of dialysis. Her serum creatinines are slowly increasing now in the 5.7 range ollowed by Dr. Marval Regal  The pain has remained stable in frequency and severity. It is somewhat positional and worse when she is recumbent.   Current Outpatient Prescriptions  Medication Sig Dispense Refill  . acetaminophen (TYLENOL) 500 MG tablet Take 500 mg by mouth every 6 (six) hours as needed for mild pain, moderate pain or headache.     Marland Kitchen amLODipine (NORVASC) 10 MG tablet Take 10 mg by mouth daily.    . Darbepoetin Alfa-Albumin (ARANESP IJ) Inject as directed every 30 (thirty) days.    . Fe Fum-FePoly-Vit C-Vit B3 (INTEGRA PO) Take 1 tablet by mouth daily.     . furosemide (LASIX) 20 MG tablet Take 40 mg by mouth as needed.    . isosorbide mononitrate (IMDUR) 60 MG 24 hr tablet Take 1 tablet (60 mg total) by mouth daily. 90  tablet 2  . ondansetron (ZOFRAN-ODT) 4 MG disintegrating tablet TAKE 1 TABLET BY MOUTH EVERY 8 HOURS AS NEEDED FOR NAUSEA OR VOMITING. 20 tablet 1  . oxyCODONE-acetaminophen (ROXICET) 5-325 MG per tablet Take 1 tablet by mouth every 6 (six) hours as needed for severe pain. 30 tablet 0  . pantoprazole (PROTONIX) 40 MG tablet Take 1 tablet (40 mg total) by mouth daily. 30 tablet 11  . paricalcitol (ZEMPLAR) 1 MCG capsule Take 1 mcg by mouth daily.    . polyethylene glycol (MIRALAX / GLYCOLAX) packet Take 17 g by mouth daily as needed for mild constipation.     No current facility-administered medications for this visit.    No Known Allergies  History   Social History  . Marital Status: Married    Spouse Name: N/A  . Number of Children: 2  . Years of Education: N/A   Occupational History  . [employer]   Social History Main Topics  . Smoking status: Never Smoker   . Smokeless tobacco: Never Used  . Alcohol Use: No  . Drug Use: No  . Sexual Activity: Yes    Birth Control/ Protection: None     Comment: Hysterectomy   Other Topics Concern  . Not on file   Social History Narrative     Review of Systems: General: negative for chills, fever, night sweats or weight changes.  Cardiovascular: negative for chest pain, dyspnea on exertion,  edema, orthopnea, palpitations, paroxysmal nocturnal dyspnea or shortness of breath Dermatological: negative for rash Respiratory: negative for cough or wheezing Urologic: negative for hematuria Abdominal: negative for nausea, vomiting, diarrhea, bright red blood per rectum, melena, or hematemesis Neurologic: negative for visual changes, syncope, or dizziness All other systems reviewed and are otherwise negative except as noted above.    Blood pressure 110/70, pulse 82, height 5\' 1"  (1.549 m), weight 211 lb 6.4 oz (95.89 kg).  General appearance: alert and no distress Neck: no adenopathy, no carotid bruit, no JVD, supple,  symmetrical, trachea midline and thyroid not enlarged, symmetric, no tenderness/mass/nodules Lungs: clear to auscultation bilaterally Heart: regular rate and rhythm, S1, S2 normal, no murmur, click, rub or gallop Extremities: 2-3+ pitting bilateral lower extremity edema  EKG normal sinus rhythm 82 without ST or T-wave changes. I personally reviewed this EKG  ASSESSMENT AND PLAN:   Essential hypertension History of hypertension with blood pressure measures 110/70. She is on amlodipine 10 mg a day. Continued current meds at current dosing   Chest pain History of atypical chest pain with a low risk Myoview.   Acute on chronic diastolic heart failure 2-D echo shows normal LV systolic function with grade 1 diastolic dysfunction. This performed 12/08/13.   Bilateral lower extremity edema She has 2-3+ bilateral lower extremity edema. I believe this is multifactorial related to her chronic renal insufficiency plus or minus her amlodipine. I suspect that this will improve once she is on dialysis. She is on furosemide low-dose.       Lorretta Harp MD FACP,FACC,FAHA, [facility] 04/28/2014 3:34 PM

## 2014-04-28 NOTE — Assessment & Plan Note (Signed)
History of atypical chest pain with a low risk Myoview.

## 2014-04-28 NOTE — Patient Instructions (Signed)
We request that you follow-up in: 6 months Karen [name]) with an extender and in 1 year with Dr Andria Rhein will receive a reminder letter in the mail two months in advance. If you don't receive a letter, please call our office to schedule the follow-up appointment.

## 2014-04-28 NOTE — Assessment & Plan Note (Signed)
History of hypertension with blood pressure measures 110/70. She is on amlodipine 10 mg a day. Continued current meds at current dosing

## 2014-04-28 NOTE — Assessment & Plan Note (Signed)
She has 2-3+ bilateral lower extremity edema. I believe this is multifactorial related to her chronic renal insufficiency plus or minus her amlodipine. I suspect that this will improve once she is on dialysis. She is on furosemide low-dose.

## 2014-04-28 NOTE — Assessment & Plan Note (Signed)
2-D echo shows normal LV systolic function with grade 1 diastolic dysfunction. This performed 12/08/13.

## 2014-05-01 ENCOUNTER — Ambulatory Visit (HOSPITAL_COMMUNITY): Payer: 59

## 2014-05-20 ENCOUNTER — Encounter (HOSPITAL_COMMUNITY): Payer: 59

## 2014-06-02 ENCOUNTER — Emergency Department (INDEPENDENT_AMBULATORY_CARE_PROVIDER_SITE_OTHER)
Admission: EM | Admit: 2014-06-02 | Discharge: 2014-06-02 | Disposition: A | Discharge status: Home / Self Care | Payer: 59 | Source: Home / Self Care | Attending: Family Medicine | Admitting: Family Medicine

## 2014-06-02 ENCOUNTER — Encounter (HOSPITAL_COMMUNITY): Payer: Self-pay | Admitting: *Deleted

## 2014-06-02 DIAGNOSIS — S96911A Strain of unspecified muscle and tendon at ankle and foot level, right foot, initial encounter: Secondary | ICD-10-CM

## 2014-06-02 MED ORDER — TRAMADOL HCL 50 MG PO TABS: 50.0000 mg | ORAL_TABLET | Freq: Two times a day (BID) | ORAL | Status: DC | PRN

## 2014-06-02 NOTE — Discharge Instructions (Signed)
Use ice pack on foot, use arch support  And medicine as needed, see specialist for further care.

## 2014-06-02 NOTE — ED Provider Notes (Signed)
CSN: [id number]     Arrival date & time 06/02/14  [id number] History   None    No chief complaint on file.  (Consider location/radiation/quality/duration/timing/severity/associated sxs/prior Treatment) Patient is a 59 y.o. female presenting with lower extremity pain.  Foot Pain This is a new problem. The current episode started more than 2 days ago. The problem has been gradually worsening. Pertinent negatives include no chest pain and no abdominal pain. The symptoms are aggravated by walking.    Past Medical History  Diagnosis Date  . Chronic kidney disease     trying to get on kidney [facility] list [facility]  . Hypertension   . Chest pain   . GERD (gastroesophageal reflux disease)   . Complication of anesthesia 2005    difficulty remembering for a while  . Heart murmur     new - Echo- 12/08/13  . Anginal pain     Dr Gwenlyn Found cardiologist  . Shortness of breath dyspnea     with exertion  . Constipation   . Arthritis     knees  . Anemia    Past Surgical History  Procedure Laterality Date  . Rotator cuff repair Right 1997  . Cholecystectomy  2005  . Knee arthroscopy Bilateral   . Bunionectomy Bilateral   . Abdominal hysterectomy  2005  . Colonoscopy    . Av fistula placement Left 12/09/2013    Procedure: INSERTION OF ARTERIOVENOUS (AV) GORE-TEX GRAFT ARM;  Surgeon: Elam Dutch, MD;  Location: [facility] OR;  Service: Vascular;  Laterality: Left;   Family History  Problem Relation Age of Onset  . Diabetes Sister   . Heart attack Father   . Hyperlipidemia Sister   . Hypertension Sister   . Kidney disease Father   . Kidney disease Mother   . Kidney disease Sister     x2   History  Substance Use Topics  . Smoking status: Never Smoker   . Smokeless tobacco: Never Used  . Alcohol Use: No   OB History    No data available     Review of Systems  Constitutional: Negative.   Cardiovascular: Negative for chest pain.  Gastrointestinal: Negative for abdominal pain.    Musculoskeletal: Positive for gait problem. Negative for myalgias, back pain and joint swelling.  Skin: Negative.     Allergies  Review of patient's allergies indicates no known allergies.  Home Medications   Prior to Admission medications   Medication Sig Start Date End Date Taking? Authorizing Provider  acetaminophen (TYLENOL) 500 MG tablet Take 500 mg by mouth every 6 (six) hours as needed for mild pain, moderate pain or headache.     Historical Provider, MD  amLODipine (NORVASC) 10 MG tablet Take 10 mg by mouth daily.    Historical Provider, MD  Darbepoetin Alfa-Albumin (ARANESP IJ) Inject as directed every 30 (thirty) days.    Historical Provider, MD  Fe Fum-FePoly-Vit C-Vit B3 (INTEGRA PO) Take 1 tablet by mouth daily.     Historical Provider, MD  furosemide (LASIX) 20 MG tablet Take 40 mg by mouth as needed.    Historical Provider, MD  isosorbide mononitrate (IMDUR) 60 MG 24 hr tablet Take 1 tablet (60 mg total) by mouth daily. 01/19/14   Brett Canales, PA-C  ondansetron (ZOFRAN-ODT) 4 MG disintegrating tablet TAKE 1 TABLET BY MOUTH EVERY 8 HOURS AS NEEDED FOR NAUSEA OR VOMITING. 02/02/14   Jerene Bears, MD  oxyCODONE-acetaminophen (ROXICET) 5-325 MG per tablet Take 1 tablet by  mouth every 6 (six) hours as needed for severe pain. 12/09/13   Alvia Grove, PA-C  pantoprazole (PROTONIX) 40 MG tablet Take 1 tablet (40 mg total) by mouth daily. 07/01/13   Lorretta Harp, MD  paricalcitol (ZEMPLAR) 1 MCG capsule Take 1 mcg by mouth daily.    Historical Provider, MD  polyethylene glycol (MIRALAX / GLYCOLAX) packet Take 17 g by mouth daily as needed for mild constipation.    Historical Provider, MD  traMADol (ULTRAM) 50 MG tablet Take 1 tablet (50 mg total) by mouth every 12 (twelve) hours as needed for moderate pain. 06/02/14   Billy Fischer, MD   BP 135/76 mmHg  Pulse 88  Temp(Src) 97.5 F (36.4 C) (Oral)  Resp 18  SpO2 100% Physical Exam  Constitutional: She is oriented to person,  place, and time. She appears well-developed and well-nourished.  Musculoskeletal: She exhibits tenderness.       Right foot: There is no swelling, no crepitus and no deformity.       Feet:  Neurological: She is alert and oriented to person, place, and time.  Skin: Skin is warm and dry.  Nursing note and vitals reviewed.   ED Course  Procedures (including critical care time) Labs Review Labs Reviewed - No data to display  Imaging Review No results found.   MDM   1. Right foot strain, initial encounter        Billy Fischer, MD 06/02/14 2010

## 2014-06-02 NOTE — ED Notes (Signed)
C/o R foot pain that radiates up to her calf.  No known injury. Pain onset Sat.

## 2014-06-11 DIAGNOSIS — Z0279 Encounter for issue of other medical certificate: Secondary | ICD-10-CM | POA: Diagnosis not present

## 2014-06-19 ENCOUNTER — Encounter (HOSPITAL_COMMUNITY): Payer: Self-pay

## 2014-06-19 ENCOUNTER — Emergency Department (HOSPITAL_COMMUNITY)
Admission: EM | Admit: 2014-06-19 | Discharge: 2014-06-19 | Disposition: A | Discharge status: Home / Self Care | Payer: 59 | Attending: Emergency Medicine | Admitting: Emergency Medicine

## 2014-06-19 DIAGNOSIS — Z8739 Personal history of other diseases of the musculoskeletal system and connective tissue: Secondary | ICD-10-CM | POA: Insufficient documentation

## 2014-06-19 DIAGNOSIS — R42 Dizziness and giddiness: Secondary | ICD-10-CM | POA: Diagnosis not present

## 2014-06-19 DIAGNOSIS — R5383 Other fatigue: Secondary | ICD-10-CM | POA: Insufficient documentation

## 2014-06-19 DIAGNOSIS — K219 Gastro-esophageal reflux disease without esophagitis: Secondary | ICD-10-CM | POA: Diagnosis not present

## 2014-06-19 DIAGNOSIS — N189 Chronic kidney disease, unspecified: Secondary | ICD-10-CM | POA: Insufficient documentation

## 2014-06-19 DIAGNOSIS — I209 Angina pectoris, unspecified: Secondary | ICD-10-CM | POA: Diagnosis not present

## 2014-06-19 DIAGNOSIS — E669 Obesity, unspecified: Secondary | ICD-10-CM | POA: Diagnosis not present

## 2014-06-19 DIAGNOSIS — I129 Hypertensive chronic kidney disease with stage 1 through stage 4 chronic kidney disease, or unspecified chronic kidney disease: Secondary | ICD-10-CM | POA: Insufficient documentation

## 2014-06-19 DIAGNOSIS — Z79899 Other long term (current) drug therapy: Secondary | ICD-10-CM | POA: Diagnosis not present

## 2014-06-19 DIAGNOSIS — R55 Syncope and collapse: Secondary | ICD-10-CM | POA: Insufficient documentation

## 2014-06-19 DIAGNOSIS — K5900 Constipation, unspecified: Secondary | ICD-10-CM | POA: Diagnosis not present

## 2014-06-19 DIAGNOSIS — R011 Cardiac murmur, unspecified: Secondary | ICD-10-CM | POA: Insufficient documentation

## 2014-06-19 DIAGNOSIS — D649 Anemia, unspecified: Secondary | ICD-10-CM | POA: Diagnosis not present

## 2014-06-19 LAB — BASIC METABOLIC PANEL
ANION GAP: 9 (ref 5–15)
BUN: 11 mg/dL (ref 6–20)
CALCIUM: 9.3 mg/dL (ref 8.9–10.3)
CO2: 32 mmol/L (ref 22–32)
Chloride: 96 mmol/L — ABNORMAL LOW (ref 101–111)
Creatinine, Ser: 3.75 mg/dL — ABNORMAL HIGH (ref 0.44–1.00)
GFR calc Af Amer: 14 mL/min — ABNORMAL LOW (ref >60)
GFR calc non Af Amer: 12 mL/min — ABNORMAL LOW (ref >60)
Glucose, Bld: 92 mg/dL (ref 65–99)
Potassium: 4 mmol/L (ref 3.5–5.1)
SODIUM: 137 mmol/L (ref 135–145)

## 2014-06-19 LAB — CBC
HCT: 40.4 % (ref 36.0–46.0)
Hemoglobin: 12.4 g/dL (ref 12.0–15.0)
MCH: 30 pg (ref 26.0–34.0)
MCHC: 30.7 g/dL (ref 30.0–36.0)
MCV: 97.6 fL (ref 78.0–100.0)
PLATELETS: 166 10*3/uL (ref 150–400)
RBC: 4.14 MIL/uL (ref 3.87–5.11)
RDW: 14.2 % (ref 11.5–15.5)
WBC: 4.5 10*3/uL (ref 4.0–10.5)

## 2014-06-19 LAB — I-STAT TROPONIN, ED: TROPONIN I, POC: 0.01 ng/mL (ref 0.00–0.08)

## 2014-06-19 MED ORDER — SODIUM CHLORIDE 0.9 % IV BOLUS (SEPSIS)
250.0000 mL | Freq: Once | INTRAVENOUS | Status: AC
  Administered 2014-06-19: 250 mL via INTRAVENOUS

## 2014-06-19 NOTE — ED Notes (Signed)
Patient ambulated and given something to drink. Patient was able to ambulate and denied dizziness. Patient was able to drink with no difficulty. MD made aware.

## 2014-06-19 NOTE — ED Notes (Signed)
Per EMS, Patient had dialysis this afternoon from 1230 to 1630. [facility]. Patient went to eat at [employer] when she started to feel weak and lightheaded in the bathroom. WIth the help of her granddaughter, Patient lowered to the floor and they called 911. Patient was alert and oriented x4. Patient denies any pain and complains of generalized weakness. EKG was NSR. Vitals per EMS: 112/70, 90 HR, 16 RR, 98 % on RA. 82 CBG

## 2014-06-19 NOTE — ED Notes (Signed)
Patient given heating pack for "achey neck"

## 2014-06-19 NOTE — ED Provider Notes (Signed)
CSN: [id number]     Arrival date & time 06/19/14  1923 History   First MD Initiated Contact with Patient 06/19/14 1950     Chief Complaint  Patient presents with  . Near Syncope     (Consider location/radiation/quality/duration/timing/severity/associated sxs/prior Treatment) Patient is a 59 y.o. female presenting with near-syncope.  Near Syncope This is a new problem. The current episode started today. Episode frequency: once. The problem has been resolved. Associated symptoms include fatigue. Pertinent negatives include no abdominal pain, anorexia, arthralgias, change in bowel habit, chest pain, chills, congestion, coughing, diaphoresis, fever, headaches, joint swelling, myalgias, nausea, neck pain, numbness, rash, sore throat, swollen glands, urinary symptoms, vertigo, visual change, vomiting or weakness. Nothing aggravates the symptoms. She has tried nothing (self resolved) for the symptoms.    Past Medical History  Diagnosis Date  . Chronic kidney disease     trying to get on kidney transplant list at [facility]  . Hypertension   . Chest pain   . GERD (gastroesophageal reflux disease)   . Complication of anesthesia 2005    difficulty remembering for a while  . Heart murmur     new - Echo- 12/08/13  . Anginal pain     Dr Gwenlyn Found cardiologist  . Shortness of breath dyspnea     with exertion  . Constipation   . Arthritis     knees  . Anemia    Past Surgical History  Procedure Laterality Date  . Rotator cuff repair Right 1997  . Cholecystectomy  2005  . Knee arthroscopy Bilateral   . Bunionectomy Bilateral   . Abdominal hysterectomy  2005  . Colonoscopy    . Av fistula placement Left 12/09/2013    Procedure: INSERTION OF ARTERIOVENOUS (AV) GORE-TEX GRAFT ARM;  Surgeon: Elam Dutch, MD;  Location: [facility] OR;  Service: Vascular;  Laterality: Left;   Family History  Problem Relation Age of Onset  . Diabetes Sister   . Heart attack Father   . Hyperlipidemia Sister   .  Hypertension Sister   . Kidney disease Father   . Kidney disease Mother   . Kidney disease Sister     x2   History  Substance Use Topics  . Smoking status: Never Smoker   . Smokeless tobacco: Never Used  . Alcohol Use: No   OB History    No data available     Review of Systems  Constitutional: Positive for fatigue. Negative for fever, chills, diaphoresis and appetite change.  HENT: Negative for congestion, ear pain, facial swelling, mouth sores and sore throat.   Eyes: Negative for visual disturbance.  Respiratory: Negative for cough, chest tightness and shortness of breath.   Cardiovascular: Positive for near-syncope. Negative for chest pain and palpitations.  Gastrointestinal: Negative for nausea, vomiting, abdominal pain, diarrhea, blood in stool, anorexia and change in bowel habit.  Endocrine: Negative for cold intolerance and heat intolerance.  Genitourinary: Negative for frequency, decreased urine volume and difficulty urinating.  Musculoskeletal: Negative for myalgias, back pain, joint swelling, arthralgias, neck pain and neck stiffness.  Skin: Negative for rash.  Neurological: Positive for dizziness and light-headedness. Negative for vertigo, weakness, numbness and headaches.  All other systems reviewed and are negative.     Allergies  Review of patient's allergies indicates no known allergies.  Home Medications   Prior to Admission medications   Medication Sig Start Date End Date Taking? Authorizing Provider  acetaminophen (TYLENOL) 500 MG tablet Take 500 mg by mouth every 6 (six)  hours as needed for mild pain, moderate pain or headache.     Historical Provider, MD  amLODipine (NORVASC) 10 MG tablet Take 10 mg by mouth daily.    Historical Provider, MD  Darbepoetin Alfa-Albumin (ARANESP IJ) Inject as directed every 30 (thirty) days.    Historical Provider, MD  Fe Fum-FePoly-Vit C-Vit B3 (INTEGRA PO) Take 1 tablet by mouth daily.     Historical Provider, MD   furosemide (LASIX) 20 MG tablet Take 40 mg by mouth as needed.    Historical Provider, MD  isosorbide mononitrate (IMDUR) 60 MG 24 hr tablet Take 1 tablet (60 mg total) by mouth daily. 01/19/14   Brett Canales, PA-C  ondansetron (ZOFRAN-ODT) 4 MG disintegrating tablet TAKE 1 TABLET BY MOUTH EVERY 8 HOURS AS NEEDED FOR NAUSEA OR VOMITING. 02/02/14   Jerene Bears, MD  oxyCODONE-acetaminophen (ROXICET) 5-325 MG per tablet Take 1 tablet by mouth every 6 (six) hours as needed for severe pain. 12/09/13   Alvia Grove, PA-C  pantoprazole (PROTONIX) 40 MG tablet Take 1 tablet (40 mg total) by mouth daily. 07/01/13   Lorretta Harp, MD  paricalcitol (ZEMPLAR) 1 MCG capsule Take 1 mcg by mouth daily.    Historical Provider, MD  polyethylene glycol (MIRALAX / GLYCOLAX) packet Take 17 g by mouth daily as needed for mild constipation.    Historical Provider, MD  traMADol (ULTRAM) 50 MG tablet Take 1 tablet (50 mg total) by mouth every 12 (twelve) hours as needed for moderate pain. 06/02/14   Billy Fischer, MD   BP 129/72 mmHg  Pulse 82  Temp(Src) 98.4 F (36.9 C) (Oral)  Resp 16  SpO2 100% Physical Exam  Constitutional: She is oriented to person, place, and time. She appears well-developed and well-nourished. No distress.  obese  HENT:  Head: Normocephalic and atraumatic.  Right Ear: External ear normal.  Left Ear: External ear normal.  Nose: Nose normal.  Eyes: Conjunctivae and EOM are normal. Pupils are equal, round, and reactive to light. Right eye exhibits no discharge. Left eye exhibits no discharge. No scleral icterus.  Neck: Normal range of motion. Neck supple.  Cardiovascular: Normal rate, regular rhythm and normal heart sounds.  Exam reveals no gallop and no friction rub.   No murmur heard. Pulmonary/Chest: Effort normal and breath sounds normal. No stridor. No respiratory distress. She has no wheezes.  Abdominal: Soft. She exhibits no distension. There is no tenderness.  Musculoskeletal:  She exhibits no edema or tenderness.  Neurological: She is alert and oriented to person, place, and time. She has normal strength. No cranial nerve deficit or sensory deficit. Coordination normal. GCS eye subscore is 4. GCS verbal subscore is 5. GCS motor subscore is 6.  Skin: Skin is warm and dry. No rash noted. She is not diaphoretic. No erythema.  Psychiatric: She has a normal mood and affect.    ED Course  Procedures (including critical care time) Labs Review Labs Reviewed  BASIC METABOLIC PANEL - Abnormal; Notable for the following:    Chloride 96 (*)    Creatinine, Ser 3.75 (*)    GFR calc non Af Amer 12 (*)    GFR calc Af Amer 14 (*)    All other components within normal limits  CBC  I-STAT TROPOININ, ED    Imaging Review No results found.   EKG Interpretation   Date/Time:  Friday June 19 2014 19:29:46 EDT Ventricular Rate:  87 PR Interval:  157 QRS Duration: 89 QT  Interval:  398 QTC Calculation: 479 R Axis:   13 Text Interpretation:  Sinus rhythm No significant change was found  Confirmed by Wyvonnia Dusky  MD, STEPHEN [phone]) on 06/19/2014 9:14:51 PM      MDM   Final diagnoses:  Syncope, unspecified syncope type      59 year old female with a history of hypertension, chronic diastolic heart failure, end-stage renal disease on hemodialysis Monday Wednesday Friday. She presents for near-syncope following dialysis session. She reports that they took a little bit more than her normal fluid today. She reports feeling fine immediately after dialysis stating that she went shopping with her family and a few hours later she began feeling weak. She reports that she normally feels this way after dialysis however she was out longer than usual and well she was going to the bathroom she felt lightheaded. Patient was with her granddaughter time include noted that the patient's left pale and weak and slowly lowered her down to the floor. There was no head trauma that time. Patient  denied any shortness of breath chest pain diaphoresis nausea vomiting. She denies any recent fevers illnesses or infections. Rest of HPI and exam as above.   Presentation concerning for hypovolemia 2/2 to over HD. Low  Concern for cardiac etiology or stroke. No electrolyte derangement. HB stable (improved from prior).  EKG normal sinus rhythm with normal intervals and axis. No evidence of acute ischemia or arrhythmia or blocks. Trop negative.   Given 250 cc bolus. Symptoms improved.   Presentation likely secondary to over dialysing. Doubt cardiac etiology.  Patient's physician and given strict return precautions. Please follow-up with her PCP as needed.  Patient seen in conjunction with Dr. Wyvonnia Dusky.  Sibyl Parr, MD Resident       Addison Lank, MD 06/19/14 [id number]  Ezequiel Essex, MD 06/20/14 [id number]  Ezequiel Essex, MD 06/20/14 [id number]

## 2014-06-19 NOTE — ED Notes (Signed)
Pt lethargic at time of time of discharge, pt acknowledges DC instructions but son was present during [employer] education.

## 2014-06-19 NOTE — Discharge Instructions (Signed)
Near-Syncope Near-syncope (commonly known as near fainting) is sudden weakness, dizziness, or feeling like you might pass out. During an episode of near-syncope, you may also develop pale skin, have tunnel vision, or feel sick to your stomach (nauseous). Near-syncope may occur when getting up after sitting or while standing for a long time. It is caused by a sudden decrease in blood flow to the brain. This decrease can result from various causes or triggers, most of which are not serious. However, because near-syncope can sometimes be a sign of something serious, a medical evaluation is required. The specific cause is often not determined. HOME CARE INSTRUCTIONS  Monitor your condition for any changes. The following actions may help to alleviate any discomfort you are experiencing:  Have someone stay with you until you feel stable.  Lie down right away and prop your feet up if you start feeling like you might faint. Breathe deeply and steadily. Wait until all the symptoms have passed. Most of these episodes last only a few minutes. You may feel tired for several hours.   Drink enough fluids to keep your urine clear or pale yellow.   If you are taking blood pressure or heart medicine, get up slowly when seated or lying down. Take several minutes to sit and then stand. This can reduce dizziness.  Follow up with your health care provider as directed. SEEK IMMEDIATE MEDICAL CARE IF:   You have a severe headache.   You have unusual pain in the chest, abdomen, or back.   You are bleeding from the mouth or rectum, or you have black or tarry stool.   You have an irregular or very fast heartbeat.   You have repeated fainting or have seizure-like jerking during an episode.   You faint when sitting or lying down.   You have confusion.   You have difficulty walking.   You have severe weakness.   You have vision problems.  MAKE SURE YOU:   Understand these instructions.  Will  watch your condition.  Will get help right away if you are not doing well or get worse. Document Released: 01/02/2005 Document Revised: 01/07/2013 Document Reviewed: 06/07/2012 Exit[employer] Patient Information 2015 [employer]. This information is not intended to replace advice given to you by your health care provider. Make sure you discuss any questions you have with your health care provider.  

## 2014-06-22 ENCOUNTER — Other Ambulatory Visit: Payer: Self-pay | Admitting: Internal Medicine

## 2014-07-13 ENCOUNTER — Telehealth: Payer: Self-pay | Admitting: Cardiovascular Disease

## 2014-07-13 NOTE — Telephone Encounter (Signed)
Pt called in stating that she needed to reschedule a test that Dr. Gwenlyn Found ordered . She says that she had to cancel because she was at risk of becoming a dialysis pt. Please f/u with her   Thanks

## 2014-07-13 NOTE — Telephone Encounter (Signed)
Patient states she is ready to schedule her heart cath that could not be scheduled until she was placed on dialysis.  She is now on dialysis--can we schedule or does she need to be seen first?

## 2014-07-14 NOTE — Telephone Encounter (Signed)
No reason to pursue cardiac catheterization unless her chest pain symptoms have increased in severity if so, she'll need to see me back in the office first

## 2014-07-14 NOTE — Telephone Encounter (Signed)
Spoke with pt, aware of dr berry's recommendations. She will call back and schedule an appt if her symptoms change.

## 2014-07-14 NOTE — Telephone Encounter (Signed)
Will forward for dr berry's review. No reference to cath in last office note.

## 2014-07-17 HISTORY — PX: KIDNEY TRANSPLANT: SHX239

## 2014-07-20 ENCOUNTER — Other Ambulatory Visit: Payer: Self-pay | Admitting: Cardiovascular Disease

## 2014-07-21 ENCOUNTER — Other Ambulatory Visit: Payer: Self-pay | Admitting: Internal Medicine

## 2014-07-21 NOTE — Telephone Encounter (Signed)
Rx(s) sent to pharmacy electronically.  

## 2014-10-22 ENCOUNTER — Ambulatory Visit: Payer: PRIVATE HEALTH INSURANCE | Admitting: Physician Assistant

## 2014-11-19 ENCOUNTER — Encounter: Payer: Self-pay | Admitting: Cardiology

## 2014-11-19 ENCOUNTER — Ambulatory Visit (INDEPENDENT_AMBULATORY_CARE_PROVIDER_SITE_OTHER): Payer: 59 | Admitting: Cardiology

## 2014-11-19 VITALS — BP 106/62 | HR 95 | Ht 61.0 in | Wt 198.1 lb

## 2014-11-19 DIAGNOSIS — Z992 Dependence on renal dialysis: Secondary | ICD-10-CM

## 2014-11-19 DIAGNOSIS — N186 End stage renal disease: Secondary | ICD-10-CM

## 2014-11-19 DIAGNOSIS — I519 Heart disease, unspecified: Secondary | ICD-10-CM | POA: Diagnosis not present

## 2014-11-19 DIAGNOSIS — IMO0001 Reserved for inherently not codable concepts without codable children: Secondary | ICD-10-CM

## 2014-11-19 DIAGNOSIS — I10 Essential (primary) hypertension: Secondary | ICD-10-CM

## 2014-11-19 DIAGNOSIS — R072 Precordial pain: Secondary | ICD-10-CM

## 2014-11-19 DIAGNOSIS — I5189 Other ill-defined heart diseases: Secondary | ICD-10-CM

## 2014-11-19 NOTE — Assessment & Plan Note (Signed)
Controlled.  

## 2014-11-19 NOTE — Progress Notes (Signed)
11/19/2014 Oleh Genin   December 13, 1955  [id number]  Primary Physician Elyn Peers, MD Primary Cardiologist: Dr Gwenlyn Found  HPI:  Pleasant 59 y/o AA female followed by Dr Gwenlyn Found with a history of HTN and renal disease. She has had chest pain in the past. She was not cathed secondary to renal disease and her Myoview was low risk Jan 2016. Dr Gwenlyn Found suspected her chest discomfort was from diastolic dysfunction. She is now on HD since April 2016. 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 She is to go to [facility] this month for an echo and stress to get back on the transplant list. She denies any anginal pain. She has chronic DOE but this has not changed.    Current Outpatient Prescriptions  Medication Sig Dispense Refill  . acetaminophen (TYLENOL) 500 MG tablet Take 500 mg by mouth every 6 (six) hours as needed for mild pain, moderate pain or headache.     . Darbepoetin Alfa-Albumin (ARANESP IJ) Inject as directed every 30 (thirty) days.    Marland Kitchen ethyl chloride spray 3 (three) times a week.  99  . Fe Fum-FePoly-Vit C-Vit B3 (INTEGRA PO) Take 1 tablet by mouth daily.     . mycophenolate (MYFORTIC) 180 MG EC tablet Take 2 tablets by mouth 2 (two) times daily.  3  . ondansetron (ZOFRAN-ODT) 4 MG disintegrating tablet TAKE 1 TABLET BY MOUTH EVERY 8 HOURS AS NEEDED FOR NAUSEA OR VOMITING. 20 tablet 0  . pantoprazole (PROTONIX) 40 MG tablet TAKE 1 TABLET (40 MG TOTAL) BY MOUTH DAILY. 30 tablet 9  . polyethylene glycol (MIRALAX / GLYCOLAX) packet Take 17 g by mouth daily as needed for mild constipation.    . predniSONE (DELTASONE) 5 MG tablet Take 1 tablet by mouth daily.  3  . sulfamethoxazole-trimethoprim (BACTRIM,SEPTRA) 400-80 MG tablet as needed. PRE DENTAL WORK  11  . tacrolimus (PROGRAF) 1 MG capsule Take 3 mg by mouth 2 (two) times daily.  10  . valGANciclovir (VALCYTE) 450 MG tablet Take 450 mg by mouth 3 (three) times a week.     No current facility-administered  medications for this visit.    No Known Allergies  Social History   Social History  . Marital Status: Married    Spouse Name: N/A  . Number of Children: 2  . Years of Education: N/A   Occupational History  . [employer]   Social History Main Topics  . Smoking status: Never Smoker   . Smokeless tobacco: Never Used  . Alcohol Use: No  . Drug Use: No  . Sexual Activity: Yes    Birth Control/ Protection: None     Comment: Hysterectomy   Other Topics Concern  . Not on file   Social History Narrative     Review of Systems: General: negative for chills, fever, night sweats or weight changes.  Cardiovascular: negative for chest pain, dyspnea on exertion, edema, orthopnea, palpitations, paroxysmal nocturnal dyspnea or shortness of breath Dermatological: negative for rash Respiratory: negative for cough or wheezing Urologic: negative for hematuria Abdominal: negative for nausea, vomiting, diarrhea, bright red blood per rectum, melena, or hematemesis Neurologic: negative for visual changes, syncope, or dizziness All other systems reviewed and are otherwise negative except as noted above.    Blood pressure 106/62, pulse 95, height 5\' 1"  (1.549 m), weight 198 lb 1.6 oz (89.858 kg).  General appearance: alert, cooperative, no distress and mildly obese Neck: no carotid bruit and no  JVD Lungs: clear to auscultation bilaterally Heart: regular rate and rhythm and 2/6 systolic murmur at LSB Extremities: Lt arm AVF Skin: Skin color, texture, turgor normal. No rashes or lesions Neurologic: Grossly normal  EKG NSR  ASSESSMENT AND PLAN:   Chest pain Low risk Myoview Jan 2016  Essential hypertension Controlled  Obesity, Class II, BMI 35.0-39.9, with comorbidity (see actual BMI) (HCC) BMI 37  End stage renal disease on dialysis [facility]) HD MWF since April 2016, Dr Marval Regal follows She underwent renal transplant in July but it failed. She is to under go  echo and stress at [facility] this month to get back on the transplant list.   PLAN  No cardiac testing at this time, she is to have an echo and stress at [facility] this month. I explained to her we have no plans for a coronary angiogram unless she has clear angina. F/U Dr Gwenlyn Found in 6 months.  Jessicca Stitzer K PA-C 11/19/2014 11:14 AM

## 2014-11-19 NOTE — Patient Instructions (Signed)
Your physician recommends that you schedule a follow-up appointment in:6 months with Dr Berry 

## 2014-11-19 NOTE — Assessment & Plan Note (Signed)
Low risk Myoview Jan 2016

## 2014-11-19 NOTE — Assessment & Plan Note (Signed)
BMI 37  

## 2014-11-19 NOTE — Assessment & Plan Note (Signed)
HD MWF since April 2016, Dr Marval Regal follows She underwent renal transplant in July but it failed. She is to under go echo and stress at [facility] this month to get back on the transplant list.

## 2014-11-25 ENCOUNTER — Other Ambulatory Visit: Payer: Self-pay | Admitting: Nephrology

## 2014-11-25 ENCOUNTER — Ambulatory Visit
Admission: RE | Admit: 2014-11-25 | Discharge: 2014-11-25 | Disposition: A | Discharge status: Home / Self Care | Payer: 59 | Source: Ambulatory Visit | Attending: Nephrology | Admitting: Nephrology

## 2014-11-25 DIAGNOSIS — M79606 Pain in leg, unspecified: Secondary | ICD-10-CM

## 2014-12-08 DIAGNOSIS — K529 Noninfective gastroenteritis and colitis, unspecified: Secondary | ICD-10-CM

## 2014-12-08 HISTORY — DX: Noninfective gastroenteritis and colitis, unspecified: K52.9

## 2014-12-15 ENCOUNTER — Encounter (HOSPITAL_COMMUNITY): Payer: Self-pay | Admitting: *Deleted

## 2014-12-15 ENCOUNTER — Emergency Department (HOSPITAL_COMMUNITY): Payer: 59

## 2014-12-15 ENCOUNTER — Inpatient Hospital Stay (HOSPITAL_COMMUNITY)
Admission: EM | Admit: 2014-12-15 | Discharge: 2014-12-22 | DRG: 871 | Disposition: A | Discharge status: Home / Self Care | Payer: 59 | Attending: Internal Medicine | Admitting: Internal Medicine

## 2014-12-15 DIAGNOSIS — N12 Tubulo-interstitial nephritis, not specified as acute or chronic: Secondary | ICD-10-CM | POA: Diagnosis present

## 2014-12-15 DIAGNOSIS — I959 Hypotension, unspecified: Secondary | ICD-10-CM | POA: Diagnosis present

## 2014-12-15 DIAGNOSIS — Z79899 Other long term (current) drug therapy: Secondary | ICD-10-CM

## 2014-12-15 DIAGNOSIS — N390 Urinary tract infection, site not specified: Secondary | ICD-10-CM

## 2014-12-15 DIAGNOSIS — A4159 Other Gram-negative sepsis: Secondary | ICD-10-CM | POA: Diagnosis not present

## 2014-12-15 DIAGNOSIS — A419 Sepsis, unspecified organism: Secondary | ICD-10-CM

## 2014-12-15 DIAGNOSIS — R0600 Dyspnea, unspecified: Secondary | ICD-10-CM | POA: Diagnosis present

## 2014-12-15 DIAGNOSIS — I776 Arteritis, unspecified: Secondary | ICD-10-CM | POA: Diagnosis present

## 2014-12-15 DIAGNOSIS — R160 Hepatomegaly, not elsewhere classified: Secondary | ICD-10-CM | POA: Diagnosis present

## 2014-12-15 DIAGNOSIS — I300 Acute nonspecific idiopathic pericarditis: Secondary | ICD-10-CM | POA: Diagnosis not present

## 2014-12-15 DIAGNOSIS — R509 Fever, unspecified: Secondary | ICD-10-CM

## 2014-12-15 DIAGNOSIS — Z7982 Long term (current) use of aspirin: Secondary | ICD-10-CM

## 2014-12-15 DIAGNOSIS — R000 Tachycardia, unspecified: Secondary | ICD-10-CM

## 2014-12-15 DIAGNOSIS — Z841 Family history of disorders of kidney and ureter: Secondary | ICD-10-CM

## 2014-12-15 DIAGNOSIS — Z940 Kidney transplant status: Secondary | ICD-10-CM

## 2014-12-15 DIAGNOSIS — I319 Disease of pericardium, unspecified: Secondary | ICD-10-CM

## 2014-12-15 DIAGNOSIS — Z992 Dependence on renal dialysis: Secondary | ICD-10-CM

## 2014-12-15 DIAGNOSIS — B370 Candidal stomatitis: Secondary | ICD-10-CM | POA: Diagnosis present

## 2014-12-15 DIAGNOSIS — Z7952 Long term (current) use of systemic steroids: Secondary | ICD-10-CM

## 2014-12-15 DIAGNOSIS — R011 Cardiac murmur, unspecified: Secondary | ICD-10-CM | POA: Diagnosis present

## 2014-12-15 DIAGNOSIS — T8612 Kidney transplant failure: Secondary | ICD-10-CM | POA: Diagnosis present

## 2014-12-15 DIAGNOSIS — N186 End stage renal disease: Secondary | ICD-10-CM

## 2014-12-15 DIAGNOSIS — L52 Erythema nodosum: Secondary | ICD-10-CM

## 2014-12-15 DIAGNOSIS — Y830 Surgical operation with transplant of whole organ as the cause of abnormal reaction of the patient, or of later complication, without mention of misadventure at the time of the procedure: Secondary | ICD-10-CM | POA: Diagnosis present

## 2014-12-15 DIAGNOSIS — I120 Hypertensive chronic kidney disease with stage 5 chronic kidney disease or end stage renal disease: Secondary | ICD-10-CM | POA: Diagnosis present

## 2014-12-15 DIAGNOSIS — R7989 Other specified abnormal findings of blood chemistry: Secondary | ICD-10-CM

## 2014-12-15 DIAGNOSIS — B961 Klebsiella pneumoniae [K. pneumoniae] as the cause of diseases classified elsewhere: Secondary | ICD-10-CM | POA: Diagnosis present

## 2014-12-15 DIAGNOSIS — A414 Sepsis due to anaerobes: Secondary | ICD-10-CM

## 2014-12-15 DIAGNOSIS — K219 Gastro-esophageal reflux disease without esophagitis: Secondary | ICD-10-CM | POA: Diagnosis present

## 2014-12-15 DIAGNOSIS — D649 Anemia, unspecified: Secondary | ICD-10-CM | POA: Diagnosis present

## 2014-12-15 LAB — CBC WITH DIFFERENTIAL/PLATELET
Basophils Absolute: 0 10*3/uL (ref 0.0–0.1)
Basophils Relative: 0 %
EOS ABS: 0.1 10*3/uL (ref 0.0–0.7)
EOS PCT: 1 %
HCT: 31.2 % — ABNORMAL LOW (ref 36.0–46.0)
Hemoglobin: 9.9 g/dL — ABNORMAL LOW (ref 12.0–15.0)
Lymphocytes Relative: 6 %
Lymphs Abs: 0.5 10*3/uL — ABNORMAL LOW (ref 0.7–4.0)
MCH: 31.8 pg (ref 26.0–34.0)
MCHC: 31.7 g/dL (ref 30.0–36.0)
MCV: 100.3 fL — ABNORMAL HIGH (ref 78.0–100.0)
MONO ABS: 0.5 10*3/uL (ref 0.1–1.0)
Monocytes Relative: 6 %
NEUTROS PCT: 87 %
Neutro Abs: 7.1 10*3/uL (ref 1.7–7.7)
PLATELETS: 192 10*3/uL (ref 150–400)
RBC: 3.11 MIL/uL — ABNORMAL LOW (ref 3.87–5.11)
RDW: 14 % (ref 11.5–15.5)
WBC: 8.1 10*3/uL (ref 4.0–10.5)

## 2014-12-15 LAB — COMPREHENSIVE METABOLIC PANEL
ALK PHOS: 97 U/L (ref 38–126)
ALT: 36 U/L (ref 14–54)
AST: 19 U/L (ref 15–41)
Albumin: 3.2 g/dL — ABNORMAL LOW (ref 3.5–5.0)
Anion gap: 14 (ref 5–15)
BUN: 15 mg/dL (ref 6–20)
CALCIUM: 8.5 mg/dL — AB (ref 8.9–10.3)
CO2: 27 mmol/L (ref 22–32)
Chloride: 98 mmol/L — ABNORMAL LOW (ref 101–111)
Creatinine, Ser: 3.51 mg/dL — ABNORMAL HIGH (ref 0.44–1.00)
GFR calc non Af Amer: 13 mL/min — ABNORMAL LOW (ref >60)
GFR, EST AFRICAN AMERICAN: 15 mL/min — AB (ref >60)
Glucose, Bld: 108 mg/dL — ABNORMAL HIGH (ref 65–99)
POTASSIUM: 3.8 mmol/L (ref 3.5–5.1)
SODIUM: 139 mmol/L (ref 135–145)
TOTAL PROTEIN: 6.2 g/dL — AB (ref 6.5–8.1)
Total Bilirubin: 0.5 mg/dL (ref 0.3–1.2)

## 2014-12-15 LAB — I-STAT CG4 LACTIC ACID, ED
LACTIC ACID, VENOUS: 1.54 mmol/L (ref 0.5–2.0)
Lactic Acid, Venous: 0.6 mmol/L (ref 0.5–2.0)

## 2014-12-15 LAB — RAPID STREP SCREEN (MED CTR MEBANE ONLY): STREPTOCOCCUS, GROUP A SCREEN (DIRECT): NEGATIVE

## 2014-12-15 MED ORDER — ACETAMINOPHEN 325 MG PO TABS
650.0000 mg | ORAL_TABLET | Freq: Once | ORAL | Status: AC
  Administered 2014-12-15: 650 mg via ORAL
  Filled 2014-12-15: qty 2

## 2014-12-15 MED ORDER — TACROLIMUS 1 MG PO CAPS
2.0000 mg | ORAL_CAPSULE | Freq: Two times a day (BID) | ORAL | Status: DC
Start: 2014-12-15 — End: 2014-12-22
  Administered 2014-12-15 – 2014-12-22 (×14): 2 mg via ORAL
  Filled 2014-12-15 (×14): qty 2

## 2014-12-15 MED ORDER — PANTOPRAZOLE SODIUM 40 MG PO TBEC
40.0000 mg | DELAYED_RELEASE_TABLET | Freq: Every day | ORAL | Status: DC
  Administered 2014-12-15 – 2014-12-22 (×7): 40 mg via ORAL
  Filled 2014-12-15 (×7): qty 1

## 2014-12-15 MED ORDER — SODIUM CHLORIDE 0.9 % IJ SOLN
3.0000 mL | Freq: Two times a day (BID) | INTRAMUSCULAR | Status: DC
  Administered 2014-12-15 – 2014-12-22 (×12): 3 mL via INTRAVENOUS

## 2014-12-15 MED ORDER — ASPIRIN EC 81 MG PO TBEC
81.0000 mg | DELAYED_RELEASE_TABLET | Freq: Every day | ORAL | Status: DC
  Administered 2014-12-16 – 2014-12-22 (×7): 81 mg via ORAL
  Filled 2014-12-15 (×7): qty 1

## 2014-12-15 MED ORDER — ACETAMINOPHEN 325 MG PO TABS
650.0000 mg | ORAL_TABLET | Freq: Four times a day (QID) | ORAL | Status: DC | PRN
  Administered 2014-12-16 – 2014-12-19 (×6): 650 mg via ORAL
  Filled 2014-12-15 (×4): qty 2

## 2014-12-15 MED ORDER — SODIUM CHLORIDE 0.9 % IV BOLUS (SEPSIS)
500.0000 mL | Freq: Once | INTRAVENOUS | Status: AC
  Administered 2014-12-15: 500 mL via INTRAVENOUS

## 2014-12-15 MED ORDER — POLYETHYLENE GLYCOL 3350 17 G PO PACK: 17.0000 g | PACK | Freq: Every day | ORAL | Status: DC | PRN

## 2014-12-15 MED ORDER — HYDROCODONE-ACETAMINOPHEN 5-325 MG PO TABS
1.0000 | ORAL_TABLET | Freq: Once | ORAL | Status: DC
  Filled 2014-12-15: qty 1

## 2014-12-15 MED ORDER — ETHYL CHLORIDE EX AERO: 1.0000 "application " | INHALATION_SPRAY | CUTANEOUS | Status: DC

## 2014-12-15 MED ORDER — PREDNISONE 5 MG PO TABS
5.0000 mg | ORAL_TABLET | Freq: Every day | ORAL | Status: DC
  Administered 2014-12-16 – 2014-12-22 (×6): 5 mg via ORAL
  Filled 2014-12-15 (×6): qty 1

## 2014-12-15 NOTE — ED Provider Notes (Signed)
CSN: [id number]     Arrival date & time 12/15/14  1654 History   First MD Initiated Contact with Patient 12/15/14 1702     Chief Complaint  Patient presents with  . Tachycardia  . Sore Throat    HPI   Karen [name] is a 59 y.o. female with a PMH of immunocompromise (on anti-rejection meds), ESRD on HD, HTN, GERD who presents to the ED from dialysis due to tachycardia. She states she did not finish her dialysis session today. She also reports generalized weakness. She was evaluated yesterday at [facility] for sore throat, and was subsequently discharged. She reports continued sore throat, and denies change in her symptoms. She denies fever, though reports chills. She reports mild headache. She denies dizziness, lightheadedness. She denies chest pain, shortness of breath, abdominal pain, N/V/D/C. She states her lower extremities "feel heavy." She denies numbness, weakness, paresthesia.   Past Medical History  Diagnosis Date  . ESRD on dialysis [facility]) April 2016    MWF  . Hypertension   . Chest pain Jan 2016    low risk Myoview   . GERD (gastroesophageal reflux disease)   . Complication of anesthesia 2005    difficulty remembering for a while  . Heart murmur Nov 2015    Aortic scleosis- no stenosis  . Shortness of breath dyspnea     with exertion  . Constipation   . Arthritis     knees  . Anemia    Past Surgical History  Procedure Laterality Date  . Rotator cuff repair Right 1997  . Cholecystectomy  2005  . Knee arthroscopy Bilateral   . Bunionectomy Bilateral   . Abdominal hysterectomy  2005  . Colonoscopy    . Av fistula placement Left 12/09/2013    Procedure: INSERTION OF ARTERIOVENOUS (AV) GORE-TEX GRAFT ARM;  Surgeon: Elam Dutch, MD;  Location: [place];  Service: Vascular;  Laterality: Left;  . Kidney transplant  July 2016    failed   Family History  Problem Relation Age of Onset  . Diabetes Sister   . Heart attack Father   . Hyperlipidemia Sister   .  Hypertension Sister   . Kidney disease Father   . Kidney disease Mother   . Kidney disease Sister     x2   Social History  Substance Use Topics  . Smoking status: Never Smoker   . Smokeless tobacco: Never Used  . Alcohol Use: No   OB History    No data available      Review of Systems  Constitutional: Positive for chills and fatigue. Negative for fever.  Respiratory: Negative for shortness of breath.   Cardiovascular: Negative for chest pain.  Gastrointestinal: Negative for nausea, vomiting, abdominal pain, diarrhea and constipation.  Genitourinary: Negative for dysuria, urgency and frequency.  Neurological: Positive for headaches. Negative for dizziness, syncope, weakness, light-headedness and numbness.  All other systems reviewed and are negative.     Allergies  Review of patient's allergies indicates no known allergies.  Home Medications   Prior to Admission medications   Medication Sig Start Date End Date Taking? Authorizing Provider  acetaminophen (TYLENOL) 500 MG tablet Take 500 mg by mouth every 6 (six) hours as needed for mild pain, moderate pain or headache.    Yes Historical Provider, MD  aspirin 81 MG tablet Take 81 mg by mouth daily.   Yes Historical Provider, MD  Darbepoetin Alfa-Albumin (ARANESP IJ) Inject as directed every 30 (thirty) days.   Yes Historical Provider,  MD  ethyl chloride spray Apply 1 application topically 3 (three) times a week. Use on arms and legs on Monday Wednesday and fridays 11/06/14  Yes Historical Provider, MD  Fe Fum-FePoly-Vit C-Vit B3 (INTEGRA PO) Take 1 tablet by mouth daily.    Yes Historical Provider, MD  pantoprazole (PROTONIX) 40 MG tablet TAKE 1 TABLET (40 MG TOTAL) BY MOUTH DAILY. 07/21/14  Yes Lorretta Harp, MD  polyethylene glycol [facility] / Floria Raveling) packet Take 17 g by mouth daily as needed for mild constipation.   Yes Historical Provider, MD  predniSONE (DELTASONE) 5 MG tablet Take 1 tablet by mouth daily. 10/22/14  Yes  Historical Provider, MD  tacrolimus (PROGRAF) 1 MG capsule Take 2 mg by mouth 2 (two) times daily.  11/14/14  Yes Historical Provider, MD  ondansetron (ZOFRAN-ODT) 4 MG disintegrating tablet TAKE 1 TABLET BY MOUTH EVERY 8 HOURS AS NEEDED FOR NAUSEA OR VOMITING. Patient not taking: Reported on 12/15/2014 07/22/14   Jerene Bears, MD    BP 130/78 mmHg  Pulse 122  Temp(Src) 100.9 F (38.3 C) (Oral)  Resp 18  Ht 5\' 1"  (1.549 m)  Wt 86.183 kg  BMI 35.92 kg/m2  SpO2 98% Physical Exam  Constitutional: She is oriented to person, place, and time. She appears well-developed and well-nourished. No distress.  Chronically ill-appearing female in no acute distress.  HENT:  Head: Normocephalic and atraumatic.  Right Ear: External ear normal.  Left Ear: External ear normal.  Nose: Nose normal.  Mouth/Throat: Uvula is midline, oropharynx is clear and moist and mucous membranes are normal. No oropharyngeal exudate.  Eyes: Conjunctivae, EOM and lids are normal. Pupils are equal, round, and reactive to light. Right eye exhibits no discharge. Left eye exhibits no discharge. No scleral icterus.  Neck: Normal range of motion. Neck supple.  Cardiovascular: Regular rhythm, intact distal pulses and normal pulses.  Tachycardia present.  Exam reveals friction rub.   Pulmonary/Chest: Effort normal and breath sounds normal. No respiratory distress. She has no wheezes. She has no rales.  Abdominal: Soft. Normal appearance and bowel sounds are normal. She exhibits no distension and no mass. There is no tenderness. There is no rigidity, no rebound and no guarding.  Musculoskeletal: Normal range of motion. She exhibits no edema or tenderness.  Neurological: She is alert and oriented to person, place, and time. She has normal strength. No cranial nerve deficit or sensory deficit.  Skin: Skin is warm, dry and intact. No rash noted. She is not diaphoretic. No erythema. No pallor.  Psychiatric: She has a normal mood and  affect. Her speech is normal and behavior is normal.  Nursing note and vitals reviewed.   ED Course  Procedures (including critical care time)  Labs Review Labs Reviewed  CBC WITH DIFFERENTIAL/PLATELET - Abnormal; Notable for the following:    RBC 3.11 (*)    Hemoglobin 9.9 (*)    HCT 31.2 (*)    MCV 100.3 (*)    Lymphs Abs 0.5 (*)    All other components within normal limits  COMPREHENSIVE METABOLIC PANEL - Abnormal; Notable for the following:    Chloride 98 (*)    Glucose, Bld 108 (*)    Creatinine, Ser 3.51 (*)    Calcium 8.5 (*)    Total Protein 6.2 (*)    Albumin 3.2 (*)    GFR calc non Af Amer 13 (*)    GFR calc Af Amer 15 (*)    All other components within normal limits  RAPID STREP SCREEN (NOT AT [facility])  CULTURE, BLOOD (ROUTINE X 2)  URINE CULTURE  CULTURE, GROUP A STREP  URINALYSIS, ROUTINE W REFLEX MICROSCOPIC (NOT AT [facility])  I-STAT CG4 LACTIC ACID, ED    Imaging Review Dg Chest 2 View  12/15/2014  CLINICAL DATA:  59 year old female with fever intact kicked during dialysis earlier today. EXAM: CHEST  2 VIEW COMPARISON:  Chest x-ray 05/30/2014. FINDINGS: Lung volumes are normal. Trace bilateral pleural effusions. No acute consolidative airspace disease. No evidence of pulmonary edema. Mild enlargement of the cardiopericardial silhouette which appears new compared to the prior study. The patient is rotated to the left on today's exam, resulting in distortion of the mediastinal contours and reduced diagnostic sensitivity and specificity for mediastinal pathology. IMPRESSION: 1. Mild enlargement of the cardiopericardial silhouette which appears new compared to prior examinations. The appearance of this is suspicious for interval development of a pericardial effusion. Clinical correlation is recommended. 2. Trace bilateral pleural effusions. Electronically Signed   By: Vinnie Langton M.D.   On: 12/15/2014 19:38     I have personally reviewed and evaluated these images and  lab results as part of my medical decision-making.   EKG Interpretation   Date/Time:  Tuesday December 15 2014 17:37:38 EST Ventricular Rate:  118 PR Interval:  137 QRS Duration: 88 QT Interval:  352 QTC Calculation: 493 R Axis:   7 Text Interpretation:  Sinus tachycardia Borderline prolonged QT interval  Since last tracing rate faster Confirmed by Eulis Foster  MD, ELLIOTT [phone]) on  12/15/2014 6:02:35 PM      MDM   Final diagnoses:  Pericarditis  Febrile illness    59 year old female presents from dialysis with tachycardia. She was evaluated in the ED at [facility] yesterday and was diagnosed with viral infection. She reports persistent sore throat (patient had a CT of her neck done yesterday, which revealed mild lobular soft tissue thickening along the posterior pharyngeal wall of the hypopharynx just above the level of the cricoid cartilage, nonspecific and could be seen with an inflammatory/infectious mucosal process; underlying malignancy is not excluded and ENT consultation is recommended for further evaluation). She denies fever, though reports chills. She reports mild headache. She denies dizziness, lightheadedness. She denies chest pain, shortness of breath, abdominal pain, N/V/D/C. She states her lower extremities feel heavy. She denies numbness, weakness, paresthesia.  Patient febrile to 100.9, mildly tachycardic. No hypotension. Heart regular rhythm, friction rub present. Lungs clear to auscultation bilaterally. Abdomen soft, nontender, nondistended. No lower extremity edema. Patient moves all extremities without difficulty. Normal neuro exam with no focal deficit.  CBC negative for leukocytosis, hemoglobin 9.9. CMP remarkable for creatinine 3.51. Lactic acid within normal limits. Rapid strep negative. Chest x-ray remarkable for mild enlargement of cardiac pericardial silhouette, suspicious for development of pericardial effusion, trace bilateral pleural effusions.  Nephrology  consulted. Spoke with Dr. Posey Pronto, who advised to admit the patient for fever and tachycardia in an immunocompromised individual. Hospitalist consulted for admission. Spoke with Dr. Alcario Drought, who will admit the patient for further evaluation and management.  BP 130/78 mmHg  Pulse 122  Temp(Src) 100.9 F (38.3 C) (Oral)  Resp 18  Ht 5\' 1"  (1.549 m)  Wt 86.183 kg  BMI 35.92 kg/m2  SpO2 98%       Marella Chimes, PA-C 12/16/14 1009  Daleen Bo, MD 12/16/14 [place], MD 12/16/14 1022

## 2014-12-15 NOTE — ED Notes (Signed)
Apple sauce given per provider

## 2014-12-15 NOTE — ED Notes (Signed)
Pt and family made aware of bed assignment 

## 2014-12-15 NOTE — H&P (Signed)
[facility] History and Physical  Karen [name] [id number] DOB: 12-31-1955 DOA: 12/15/2014  Referring physician: EDP PCP: Elyn Peers, MD   Chief Complaint: Viral syndrome   HPI: Karen [name] is a 59 y.o. female h/o ESRD, dialysis TTS, who presents to the ED with c/o fatigue today after dialysis.  She was diagnosed with a viral URI yesterday at [place].  Today had tachycardia after dialysis.  Complains of sore throat, no chest pain.  CXR shows plural effusion and enlarged pericardial silhouette, has pericardial rub on ascultation.  Review of Systems: Systems reviewed.  As above, otherwise negative  Past Medical History  Diagnosis Date  . ESRD on dialysis [facility]) April 2016    MWF  . Hypertension   . Chest pain Jan 2016    low risk Myoview   . GERD (gastroesophageal reflux disease)   . Complication of anesthesia 2005    difficulty remembering for a while  . Heart murmur Nov 2015    Aortic scleosis- no stenosis  . Shortness of breath dyspnea     with exertion  . Constipation   . Arthritis     knees  . Anemia    Past Surgical History  Procedure Laterality Date  . Rotator cuff repair Right 1997  . Cholecystectomy  2005  . Knee arthroscopy Bilateral   . Bunionectomy Bilateral   . Abdominal hysterectomy  2005  . Colonoscopy    . Av fistula placement Left 12/09/2013    Procedure: INSERTION OF ARTERIOVENOUS (AV) GORE-TEX GRAFT ARM;  Surgeon: Elam Dutch, MD;  Location: [place];  Service: Vascular;  Laterality: Left;  . Kidney transplant  July 2016    failed   Social History:  reports that she has never smoked. She has never used smokeless tobacco. She reports that she does not drink alcohol or use illicit drugs.  No Known Allergies  Family History  Problem Relation Age of Onset  . Diabetes Sister   . Heart attack Father   . Hyperlipidemia Sister   . Hypertension Sister   . Kidney disease Father   . Kidney disease Mother   . Kidney disease  Sister     x2     Prior to Admission medications   Medication Sig Start Date End Date Taking? Authorizing Provider  acetaminophen (TYLENOL) 500 MG tablet Take 500 mg by mouth every 6 (six) hours as needed for mild pain, moderate pain or headache.    Yes Historical Provider, MD  aspirin 81 MG tablet Take 81 mg by mouth daily.   Yes Historical Provider, MD  Darbepoetin Alfa-Albumin (ARANESP IJ) Inject as directed every 30 (thirty) days.   Yes Historical Provider, MD  ethyl chloride spray Apply 1 application topically 3 (three) times a week. Use on arms and legs on Monday Wednesday and fridays 11/06/14  Yes Historical Provider, MD  Fe Fum-FePoly-Vit C-Vit B3 (INTEGRA PO) Take 1 tablet by mouth daily.    Yes Historical Provider, MD  pantoprazole (PROTONIX) 40 MG tablet TAKE 1 TABLET (40 MG TOTAL) BY MOUTH DAILY. 07/21/14  Yes Lorretta Harp, MD  polyethylene glycol [facility] / Floria Raveling) packet Take 17 g by mouth daily as needed for mild constipation.   Yes Historical Provider, MD  predniSONE (DELTASONE) 5 MG tablet Take 1 tablet by mouth daily. 10/22/14  Yes Historical Provider, MD  tacrolimus (PROGRAF) 1 MG capsule Take 2 mg by mouth 2 (two) times daily.  11/14/14  Yes Historical Provider, MD   Physical Exam: Danley Danker  Vitals:   12/15/14 1815 12/15/14 1942  BP: 130/77 130/78  Pulse: 115 122  Temp:    Resp: 23 18    BP 130/78 mmHg  Pulse 122  Temp(Src) 100.9 F (38.3 C) (Oral)  Resp 18  Ht 5\' 1"  (1.549 m)  Wt 86.183 kg (190 lb)  BMI 35.92 kg/m2  SpO2 98%  General Appearance:    Alert, oriented, no distress, appears stated age  Head:    Normocephalic, atraumatic  Eyes:    PERRL, EOMI, sclera non-icteric        Nose:   Nares without drainage or epistaxis. Mucosa, turbinates normal  Throat:   Moist mucous membranes. Oropharynx without erythema or exudate.  Neck:   Supple. No carotid bruits.  No thyromegaly.  No lymphadenopathy.   Back:     No CVA tenderness, no spinal tenderness  Lungs:      Clear to auscultation bilaterally, without wheezes, rhonchi or rales  Chest wall:    No tenderness to palpitation  Heart:    Tachycardic, regular, pericardial rub.  Abdomen:     Soft, non-tender, nondistended, normal bowel sounds, no organomegaly  Genitalia:    deferred  Rectal:    deferred  Extremities:   No clubbing, cyanosis or edema.  Pulses:   2+ and symmetric all extremities  Skin:   Skin color, texture, turgor normal, no rashes or lesions  Lymph nodes:   Cervical, supraclavicular, and axillary nodes normal  Neurologic:   CNII-XII intact. Normal strength, sensation and reflexes      throughout    Labs on Admission:  Basic Metabolic Panel:  Recent Labs Lab 12/15/14 1851  NA 139  K 3.8  CL 98*  CO2 27  GLUCOSE 108*  BUN 15  CREATININE 3.51*  CALCIUM 8.5*   Liver Function Tests:  Recent Labs Lab 12/15/14 1851  AST 19  ALT 36  ALKPHOS 97  BILITOT 0.5  PROT 6.2*  ALBUMIN 3.2*   No results for input(s): LIPASE, AMYLASE in the last 168 hours. No results for input(s): AMMONIA in the last 168 hours. CBC:  Recent Labs Lab 12/15/14 1851  WBC 8.1  NEUTROABS 7.1  HGB 9.9*  HCT 31.2*  MCV 100.3*  PLT 192   Cardiac Enzymes: No results for input(s): CKTOTAL, CKMB, CKMBINDEX, TROPONINI in the last 168 hours.  BNP (last 3 results) No results for input(s): PROBNP in the last 8760 hours. CBG: No results for input(s): GLUCAP in the last 168 hours.  Radiological Exams on Admission: Dg Chest 2 View  12/15/2014  CLINICAL DATA:  59 year old female with fever intact kicked during dialysis earlier today. EXAM: CHEST  2 VIEW COMPARISON:  Chest x-ray 05/30/2014. FINDINGS: Lung volumes are normal. Trace bilateral pleural effusions. No acute consolidative airspace disease. No evidence of pulmonary edema. Mild enlargement of the cardiopericardial silhouette which appears new compared to the prior study. The patient is rotated to the left on today's exam, resulting in  distortion of the mediastinal contours and reduced diagnostic sensitivity and specificity for mediastinal pathology. IMPRESSION: 1. Mild enlargement of the cardiopericardial silhouette which appears new compared to prior examinations. The appearance of this is suspicious for interval development of a pericardial effusion. Clinical correlation is recommended. 2. Trace bilateral pleural effusions. Electronically Signed   By: Vinnie Langton M.D.   On: 12/15/2014 19:38    EKG: Independently reviewed.  Assessment/Plan Principal Problem:   Acute viral pericarditis Active Problems:   End stage renal disease on dialysis ([place])  1. Acute Viral Pericarditis - 1. Admit for overnight obs 2. Tele monitor 3. Per Dr. Posey Pronto - try to avoid NSAIDs for now 2. ESRD on dialysis - 1. [facility] aware of patients obs admit 2. Had kidney transplant, and still taking immunosuppressives at this point to prevent full blown rejection reaction as they try to get her back on transplant list.    Code Status: Full  Family Communication: Family at bedside Disposition Plan: Admit to obs   Time spent: 50 min  GARDNER, JARED M. [facility] Pager [phone]  If 7AM-7PM, please contact the day team taking care of the patient [email or web address] Password [facility] 12/15/2014, 9:38 PM

## 2014-12-15 NOTE — ED Provider Notes (Signed)
  Face-to-face evaluation   History: she presents for evaluation of tiredness, sore throat and weakness, after dialysis today. Also during dialysis her heart rate, elevated, and stayed tachycardic.She denies chest pain at this time.    Physical exam:alert, elderly appearing female in mild pain.heart regular rate and rhythm with a rub. Lungs clear anteriorly. Chest nontender to palpation.   Medical screening examination/treatment/procedure(s) were conducted as a shared visit with non-physician practitioner(s) and myself.  I personally evaluated the patient during the encounter  Daleen Bo, MD 12/16/14 1022

## 2014-12-15 NOTE — ED Notes (Signed)
Pt arrives from dialysis via [facility]. Pt states she was d/c from [facility] yesterday and dx with a virus. Pt received 2 out of 3 hours of dialysis today. Pt states she began having a sore throat on Saturday and it has progressively gotten worse.

## 2014-12-15 NOTE — ED Notes (Signed)
Attempted to call report

## 2014-12-16 ENCOUNTER — Encounter (HOSPITAL_COMMUNITY): Payer: Self-pay | Admitting: General Practice

## 2014-12-16 DIAGNOSIS — Z992 Dependence on renal dialysis: Secondary | ICD-10-CM | POA: Diagnosis not present

## 2014-12-16 DIAGNOSIS — I300 Acute nonspecific idiopathic pericarditis: Secondary | ICD-10-CM | POA: Diagnosis not present

## 2014-12-16 DIAGNOSIS — J029 Acute pharyngitis, unspecified: Secondary | ICD-10-CM

## 2014-12-16 DIAGNOSIS — N186 End stage renal disease: Secondary | ICD-10-CM | POA: Diagnosis not present

## 2014-12-16 LAB — BASIC METABOLIC PANEL
ANION GAP: 10 (ref 5–15)
BUN: 18 mg/dL (ref 6–20)
CALCIUM: 8.2 mg/dL — AB (ref 8.9–10.3)
CO2: 28 mmol/L (ref 22–32)
Chloride: 102 mmol/L (ref 101–111)
Creatinine, Ser: 4.32 mg/dL — ABNORMAL HIGH (ref 0.44–1.00)
GFR, EST AFRICAN AMERICAN: 12 mL/min — AB (ref >60)
GFR, EST NON AFRICAN AMERICAN: 10 mL/min — AB (ref >60)
GLUCOSE: 72 mg/dL (ref 65–99)
POTASSIUM: 3.9 mmol/L (ref 3.5–5.1)
SODIUM: 140 mmol/L (ref 135–145)

## 2014-12-16 LAB — CBC
HCT: 29.8 % — ABNORMAL LOW (ref 36.0–46.0)
Hemoglobin: 9.1 g/dL — ABNORMAL LOW (ref 12.0–15.0)
MCH: 31.1 pg (ref 26.0–34.0)
MCHC: 30.5 g/dL (ref 30.0–36.0)
MCV: 101.7 fL — AB (ref 78.0–100.0)
PLATELETS: 176 10*3/uL (ref 150–400)
RBC: 2.93 MIL/uL — AB (ref 3.87–5.11)
RDW: 14 % (ref 11.5–15.5)
WBC: 4.9 10*3/uL (ref 4.0–10.5)

## 2014-12-16 MED ORDER — MENTHOL 3 MG MT LOZG
1.0000 | LOZENGE | OROMUCOSAL | Status: DC | PRN
  Administered 2014-12-16: 3 mg via ORAL
  Filled 2014-12-16 (×2): qty 9

## 2014-12-16 NOTE — Consult Note (Signed)
Renal Service Consult Note [place] 12/16/2014 Sol Blazing Requesting Physician:  Dr Eliseo Squires  Reason for Consult:  ESRD patient with fevers, sore throat HPI: The patient is a 59 y.o. year-old with hx of HTN, DJD and ESRD started dialysis April 2016 and received renal transplant in July 2016.  The transplant didn't work and she was taken off Cellcept and prograf / pred continued and she is undergoing testing to try to get back on the Tx list.    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  Records state work up for cause was negative (hiv, hepatitis, BK, EBV, CMV and Cdif negative). She did have ^LFT's and leukopenia. Hematology saw her for severe low WBC which resolved, felt possible virus causing BM suppression. She had oral thrush treated.  Bactrim and Valcyte were held while awaiting G6PD testing.    She gets HD MWF at [facility].  This past weekend she developed fatigue, fevers/ chills and sore throat with sore R neck. Seen at [facility] in ER 11/28, then came her 11/29 after HD with report of "fast heart rate" on dialysis.  Today she has no new complaitns. Denies any chest pain, SOB, cough, hemoptysis, rigors.  No abd pain n/v/d.  No joint pain.  Vasculitis on the legs is gone.     Past Medical History  Past Medical History  Diagnosis Date  . Hypertension   . Chest pain Jan 2016    low risk Myoview   . GERD (gastroesophageal reflux disease)   . Complication of anesthesia 2005    difficulty remembering for a while  . Heart murmur Nov 2015    Aortic scleosis- no stenosis  . Shortness of breath dyspnea     with exertion  . Constipation   . Arthritis     knees  . Anemia   . Pericarditis 11/2014  . ESRD on dialysis [facility]) April 2016    MWF   Past Surgical History  Past Surgical History  Procedure Laterality Date  . Rotator cuff repair Right 1997  . Cholecystectomy   2005  . Knee arthroscopy Bilateral   . Bunionectomy Bilateral   . Abdominal hysterectomy  2005  . Colonoscopy    . Av fistula placement Left 12/09/2013    Procedure: INSERTION OF ARTERIOVENOUS (AV) GORE-TEX GRAFT ARM;  Surgeon: Elam Dutch, MD;  Location: [facility];  Service: Vascular;  Laterality: Left;  . Kidney transplant  July 2016    failed   Family History  Family History  Problem Relation Age of Onset  . Diabetes Sister   . Heart attack Father   . Hyperlipidemia Sister   . Hypertension Sister   . Kidney disease Father   . Kidney disease Mother   . Kidney disease Sister     x2   Social History  reports that she has never smoked. She has never used smokeless tobacco. She reports that she does not drink alcohol or use illicit drugs. Allergies No Known Allergies Home medications Prior to Admission medications   Medication Sig Start Date End Date Taking? Authorizing Provider  acetaminophen (TYLENOL) 500 MG tablet Take 500 mg by mouth every 6 (six) hours as needed for mild pain, moderate pain or headache.    Yes Historical Provider, MD  aspirin 81 MG tablet Take 81 mg by mouth daily.   Yes Historical Provider, MD  Darbepoetin Alfa-Albumin Kyra Searles  IJ) Inject as directed every 30 (thirty) days.   Yes Historical Provider, MD  ethyl chloride spray Apply 1 application topically 3 (three) times a week. Use on arms and legs on Monday Wednesday and fridays 11/06/14  Yes Historical Provider, MD  Fe Fum-FePoly-Vit C-Vit B3 (INTEGRA PO) Take 1 tablet by mouth daily.    Yes Historical Provider, MD  pantoprazole (PROTONIX) 40 MG tablet TAKE 1 TABLET (40 MG TOTAL) BY MOUTH DAILY. 07/21/14  Yes Lorretta Harp, MD  polyethylene glycol [facility] Floria Raveling) packet Take 17 g by mouth daily as needed for mild constipation.   Yes Historical Provider, MD  predniSONE (DELTASONE) 5 MG tablet Take 1 tablet by mouth daily. 10/22/14  Yes Historical Provider, MD  tacrolimus (PROGRAF) 1 MG capsule Take 2 mg  by mouth 2 (two) times daily.  11/14/14  Yes Historical Provider, MD   Liver Function Tests  Recent Labs Lab 12/15/14 1851  AST 19  ALT 36  ALKPHOS 97  BILITOT 0.5  PROT 6.2*  ALBUMIN 3.2*   No results for input(s): LIPASE, AMYLASE in the last 168 hours. CBC  Recent Labs Lab 12/15/14 1851 12/16/14 0540  WBC 8.1 4.9  NEUTROABS 7.1  --   HGB 9.9* 9.1*  HCT 31.2* 29.8*  MCV 100.3* 101.7*  PLT 192 0000000   Basic Metabolic Panel  Recent Labs Lab 12/15/14 1851 12/16/14 0540  NA 139 140  K 3.8 3.9  CL 98* 102  CO2 27 28  GLUCOSE 108* 72  BUN 15 18  CREATININE 3.51* 4.32*  CALCIUM 8.5* 8.2*    Filed Vitals:   12/15/14 2304 12/16/14 0104 12/16/14 0546 12/16/14 0554  BP: 127/73 122/65  112/68  Pulse: 112 105  97  Temp: 99.3 F (37.4 C) 98.9 F (37.2 C)  99 F (37.2 C)  TempSrc: Oral Oral  Oral  Resp: 18 20  20   Height: 5\' 1"  (1.549 m)     Weight: 85.911 kg (189 lb 6.4 oz)  85.231 kg (187 lb 14.4 oz)   SpO2: 99% 96%  97%   Exam Alert, chron ill appearing, no distress No rash, cyanosis or gangrene Sclera anicteric, throat clear No jvd Chest clear bilat RRR soft sem No rub no gallop Abd soft obese LLQ transplant nontender no mass or hsm +bs GU deferred MS no joint effusion/ deformity Ext no LE edema LUA AVG +bruit no drainage/ erythema Neuro alert ox 3 nf  [facility]  4h  85.5kg  2/2.25 bath  Heparin 4000   LUA AVG Venofer 50/wk  Assessment: 1 Fevers/ sore throat - in patient w recent failed transplant still on immunosuppression w tac/ prednisone. She does not have pericarditis, there is no rub/ no chest pain/ normal EKG and the recent admit at [facility] was for "vasculitis" of the LE's , not pericarditis. Throat is clear, strep screen neg, blood cx neg. No urine yet sent.   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  5 HTN - no meds 6 Vol is at her dry wt   Plan - HD tomorrow off schedule, then Fri vs Sat depending on whether IP vs OP.    Kelly Splinter MD [employer] pager [phone]    cell [phone] 12/16/2014, 4:40 PM

## 2014-12-16 NOTE — Progress Notes (Addendum)
PROGRESS NOTE  Karen [name] [id number] DOB: 13-Jul-1955 DOA: 12/15/2014 PCP: Elyn Peers, MD  Assessment/Plan: Acute Viral Pericarditis?  Appears to be an error-- appears to be a URI only obs Tele monitor -echo -patient never had chest pain, just weakness Per Dr. Posey Pronto - try to avoid NSAIDs for now  ESRD on dialysis - [facility] aware of patients obs admit Had kidney transplant, and still taking immunosuppressives at this point to prevent full blown rejection reaction as they try to get her back on transplant list.  Viral URI with sore throat -symptomatic treatment  PT eval  Code Status: full Family Communication: patient Disposition Plan: home 1-2 days   Consultants:  renal  Procedures:      HPI/Subjective: No SOB, no CP Feels weak, legs weak  Objective: Filed Vitals:   12/16/14 0104 12/16/14 0554  BP: 122/65 112/68  Pulse: 105 97  Temp: 98.9 F (37.2 C) 99 F (37.2 C)  Resp: 20 20    Intake/Output Summary (Last 24 hours) at 12/16/14 1140 Last data filed at 12/16/14 0929  Gross per 24 hour  Intake    480 ml  Output    875 ml  Net   -395 ml   Filed Weights   12/15/14 1701 12/15/14 2304 12/16/14 0546  Weight: 86.183 kg (190 lb) 85.911 kg (189 lb 6.4 oz) 85.231 kg (187 lb 14.4 oz)    Exam:   General:  Awake, NAD  Cardiovascular: rrr  Respiratory: clear  Abdomen: +BS, soft  Musculoskeletal: no edema  Data Reviewed: Basic Metabolic Panel:  Recent Labs Lab 12/15/14 1851 12/16/14 0540  NA 139 140  K 3.8 3.9  CL 98* 102  CO2 27 28  GLUCOSE 108* 72  BUN 15 18  CREATININE 3.51* 4.32*  CALCIUM 8.5* 8.2*   Liver Function Tests:  Recent Labs Lab 12/15/14 1851  AST 19  ALT 36  ALKPHOS 97  BILITOT 0.5  PROT 6.2*  ALBUMIN 3.2*   No results for input(s): LIPASE, AMYLASE in the last 168 hours. No results for input(s): AMMONIA in the last 168 hours. CBC:  Recent Labs Lab 12/15/14 1851 12/16/14 0540  WBC 8.1 4.9    NEUTROABS 7.1  --   HGB 9.9* 9.1*  HCT 31.2* 29.8*  MCV 100.3* 101.7*  PLT 192 176   Cardiac Enzymes: No results for input(s): CKTOTAL, CKMB, CKMBINDEX, TROPONINI in the last 168 hours. BNP (last 3 results) No results for input(s): BNP in the last 8760 hours.  ProBNP (last 3 results) No results for input(s): PROBNP in the last 8760 hours.  CBG: No results for input(s): GLUCAP in the last 168 hours.  Recent Results (from the past 240 hour(s))  Rapid strep screen     Status: None   Collection Time: 12/15/14  6:35 PM  Result Value Ref Range Status   Streptococcus, Group A Screen (Direct) NEGATIVE NEGATIVE Final    Comment: (NOTE) A Rapid Antigen test may result negative if the antigen level in the sample is below the detection level of this test. The FDA has not cleared this test as a stand-alone test therefore the rapid antigen negative result has reflexed to a Group A Strep culture.      Studies: Dg Chest 2 View  12/15/2014  CLINICAL DATA:  59 year old female with fever intact kicked during dialysis earlier today. EXAM: CHEST  2 VIEW COMPARISON:  Chest x-ray 05/30/2014. FINDINGS: Lung volumes are normal. Trace bilateral pleural effusions. No acute consolidative airspace disease. No evidence  of pulmonary edema. Mild enlargement of the cardiopericardial silhouette which appears new compared to the prior study. The patient is rotated to the left on today's exam, resulting in distortion of the mediastinal contours and reduced diagnostic sensitivity and specificity for mediastinal pathology. IMPRESSION: 1. Mild enlargement of the cardiopericardial silhouette which appears new compared to prior examinations. The appearance of this is suspicious for interval development of a pericardial effusion. Clinical correlation is recommended. 2. Trace bilateral pleural effusions. Electronically Signed   By: Vinnie Langton M.D.   On: 12/15/2014 19:38    Scheduled Meds: . aspirin EC  81 mg Oral  Daily  . HYDROcodone-acetaminophen  1 tablet Oral Once  . pantoprazole  40 mg Oral Daily  . predniSONE  5 mg Oral Q breakfast  . sodium chloride  3 mL Intravenous Q12H  . tacrolimus  2 mg Oral BID   Continuous Infusions:  Antibiotics Given (last 72 hours)    None      Principal Problem:   Acute viral pericarditis Active Problems:   End stage renal disease on dialysis [facility])    Time spent: 35 min    [place] [facility] Pager [phone]. If 7PM-7AM, please contact night-coverage at [email or web address], password [facility] 12/16/2014, 11:40 AM

## 2014-12-16 NOTE — Progress Notes (Signed)
Pt arrived to floor in NAD, VSS, c/o headache. No medicine due at this time for pain, pt states will take tylenol when due. Pt oriented to room and floor, pt requesting to eat right now. Pt educated on fall risk and bed alarm use and to call RN for assistance. Pt verbalized understanding.

## 2014-12-17 DIAGNOSIS — R509 Fever, unspecified: Secondary | ICD-10-CM | POA: Diagnosis not present

## 2014-12-17 DIAGNOSIS — Z992 Dependence on renal dialysis: Secondary | ICD-10-CM | POA: Diagnosis not present

## 2014-12-17 DIAGNOSIS — N186 End stage renal disease: Secondary | ICD-10-CM

## 2014-12-17 DIAGNOSIS — B370 Candidal stomatitis: Secondary | ICD-10-CM

## 2014-12-17 DIAGNOSIS — R05 Cough: Secondary | ICD-10-CM | POA: Diagnosis not present

## 2014-12-17 DIAGNOSIS — A419 Sepsis, unspecified organism: Secondary | ICD-10-CM | POA: Diagnosis not present

## 2014-12-17 DIAGNOSIS — L52 Erythema nodosum: Secondary | ICD-10-CM | POA: Diagnosis not present

## 2014-12-17 DIAGNOSIS — L959 Vasculitis limited to the skin, unspecified: Secondary | ICD-10-CM | POA: Diagnosis not present

## 2014-12-17 DIAGNOSIS — B961 Klebsiella pneumoniae [K. pneumoniae] as the cause of diseases classified elsewhere: Secondary | ICD-10-CM | POA: Diagnosis not present

## 2014-12-17 DIAGNOSIS — T8611 Kidney transplant rejection: Secondary | ICD-10-CM

## 2014-12-17 DIAGNOSIS — N390 Urinary tract infection, site not specified: Secondary | ICD-10-CM | POA: Diagnosis not present

## 2014-12-17 DIAGNOSIS — I959 Hypotension, unspecified: Secondary | ICD-10-CM | POA: Diagnosis not present

## 2014-12-17 LAB — CBC
HCT: 29.4 % — ABNORMAL LOW (ref 36.0–46.0)
Hemoglobin: 9 g/dL — ABNORMAL LOW (ref 12.0–15.0)
MCH: 30.9 pg (ref 26.0–34.0)
MCHC: 30.6 g/dL (ref 30.0–36.0)
MCV: 101 fL — AB (ref 78.0–100.0)
PLATELETS: 179 10*3/uL (ref 150–400)
RBC: 2.91 MIL/uL — ABNORMAL LOW (ref 3.87–5.11)
RDW: 14.1 % (ref 11.5–15.5)
WBC: 12.4 10*3/uL — AB (ref 4.0–10.5)

## 2014-12-17 LAB — URINALYSIS, ROUTINE W REFLEX MICROSCOPIC
BILIRUBIN URINE: NEGATIVE
Glucose, UA: NEGATIVE mg/dL
Ketones, ur: NEGATIVE mg/dL
Nitrite: NEGATIVE
PH: 8.5 — AB (ref 5.0–8.0)
Protein, ur: 100 mg/dL — AB
SPECIFIC GRAVITY, URINE: 1.011 (ref 1.005–1.030)

## 2014-12-17 LAB — URINE MICROSCOPIC-ADD ON

## 2014-12-17 LAB — CULTURE, GROUP A STREP: STREP A CULTURE: NEGATIVE

## 2014-12-17 LAB — RENAL FUNCTION PANEL
Albumin: 2.7 g/dL — ABNORMAL LOW (ref 3.5–5.0)
Anion gap: 13 (ref 5–15)
BUN: 30 mg/dL — AB (ref 6–20)
CHLORIDE: 101 mmol/L (ref 101–111)
CO2: 24 mmol/L (ref 22–32)
CREATININE: 5.91 mg/dL — AB (ref 0.44–1.00)
Calcium: 8.3 mg/dL — ABNORMAL LOW (ref 8.9–10.3)
GFR calc Af Amer: 8 mL/min — ABNORMAL LOW (ref >60)
GFR calc non Af Amer: 7 mL/min — ABNORMAL LOW (ref >60)
GLUCOSE: 99 mg/dL (ref 65–99)
Phosphorus: 4.6 mg/dL (ref 2.5–4.6)
Potassium: 4 mmol/L (ref 3.5–5.1)
Sodium: 138 mmol/L (ref 135–145)

## 2014-12-17 LAB — C-REACTIVE PROTEIN: CRP: 32.1 mg/dL — AB (ref <1.0)

## 2014-12-17 LAB — SEDIMENTATION RATE: SED RATE: 136 mm/h — AB (ref 0–22)

## 2014-12-17 MED ORDER — ALTEPLASE 2 MG IJ SOLR: 2.0000 mg | Freq: Once | INTRAMUSCULAR | Status: DC | PRN

## 2014-12-17 MED ORDER — HEPARIN SODIUM (PORCINE) 1000 UNIT/ML DIALYSIS: 1000.0000 [IU] | INTRAMUSCULAR | Status: DC | PRN

## 2014-12-17 MED ORDER — LIDOCAINE-PRILOCAINE 2.5-2.5 % EX CREA: 1.0000 "application " | TOPICAL_CREAM | CUTANEOUS | Status: DC | PRN

## 2014-12-17 MED ORDER — HEPARIN SODIUM (PORCINE) 1000 UNIT/ML DIALYSIS: 4000.0000 [IU] | Freq: Once | INTRAMUSCULAR | Status: DC

## 2014-12-17 MED ORDER — MAGIC MOUTHWASH W/LIDOCAINE
5.0000 mL | Freq: Four times a day (QID) | ORAL | Status: DC
  Administered 2014-12-17 – 2014-12-22 (×17): 5 mL via ORAL
  Filled 2014-12-17 (×24): qty 5

## 2014-12-17 MED ORDER — PENTAFLUOROPROP-TETRAFLUOROETH EX AERO: 1.0000 "application " | INHALATION_SPRAY | CUTANEOUS | Status: DC | PRN

## 2014-12-17 MED ORDER — FLUCONAZOLE 200 MG PO TABS
200.0000 mg | ORAL_TABLET | Freq: Every day | ORAL | Status: DC
  Administered 2014-12-17 – 2014-12-22 (×5): 200 mg via ORAL
  Filled 2014-12-17 (×6): qty 1

## 2014-12-17 MED ORDER — SODIUM CHLORIDE 0.9 % IV SOLN: 100.0000 mL | INTRAVENOUS | Status: DC | PRN

## 2014-12-17 MED ORDER — LIDOCAINE HCL (PF) 1 % IJ SOLN: 5.0000 mL | INTRAMUSCULAR | Status: DC | PRN

## 2014-12-17 NOTE — Progress Notes (Signed)
Physical Therapy Treatment Patient Details Name: Karen [name] MRN: [id number] DOB: 1955-07-13 Today's Date: 12/17/2014    History of Present Illness Karen [name] is a 59 y.o. female h/o ESRD, dialysis TTS, who presents to the ED with c/o fatigue today after dialysis. She was diagnosed with a viral URI yesterday at [facility] [address]. Today had tachycardia after dialysis. Complains of sore throat, no chest pain.    PT Comments    Pt admitted with/for fatigue post dialysis, suspect due to viral illness.  Pt currently limited functionally due to the problems listed below.  (see problems list.)  Pt will benefit from PT to maximize function and safety to be able to get home safely with limited available assist of family.   Follow Up Recommendations  Home health PT;Supervision - Intermittent     Equipment Recommendations  None recommended by PT    Recommendations for Other Services       Precautions / Restrictions Precautions Precautions: Fall    Mobility  Bed Mobility Overal bed mobility: Modified Independent                Transfers Overall transfer level: Needs assistance   Transfers: Sit to/from Stand Sit to Stand: Supervision            Ambulation/Gait Ambulation/Gait assistance: Min guard Ambulation Distance (Feet): 220 Feet Assistive device: None (iv pole) Gait Pattern/deviations: Step-through pattern Gait velocity: slower   General Gait Details: generally steady, but gaurded and listless   Stairs            Wheelchair Mobility    Modified Rankin (Stroke Patients Only)       Balance Overall balance assessment: Needs assistance Sitting-balance support: No upper extremity supported Sitting balance-Leahy Scale: Good     Standing balance support: No upper extremity supported Standing balance-Leahy Scale: Fair                      Cognition Arousal/Alertness: Awake/alert   Overall Cognitive Status: Within Functional Limits for  tasks assessed                      Exercises      General Comments General comments (skin integrity, edema, etc.): During gait, afib acting up and EHR up to over 150bpm then down to as low as 105  Sats 92%.  Second trial  sats maintained at 92% and EHR 105-120 bpm  All on RA.      Pertinent Vitals/Pain Pain Assessment: Faces Faces Pain Scale: Hurts little more Pain Location: legs from vasculitis Pain Descriptors / Indicators: Aching;Grimacing Pain Intervention(s): Monitored during session    Home Living Family/patient expects to be discharged to:: Private residence Living Arrangements: Spouse/significant other (He is much older and more infirm.  Legally blind.) Available Help at Discharge: Family;Available PRN/intermittently Type of Home: House Home Access: Stairs to enter   Home Layout: Two level;Bed/bath upstairs Home Equipment: None      Prior Function Level of Independence: Independent          PT Goals (current goals can now be found in the care plan section) Acute Rehab PT Goals Patient Stated Goal: home because no one can care for my husbance PT Goal Formulation: With patient Time For Goal Achievement: 12/24/14 Potential to Achieve Goals: Good    Frequency  Min 3X/week    PT Plan      Co-evaluation             End  of Session   Activity Tolerance: Patient tolerated treatment well Patient left: in bed     Time: [id number] PT Time Calculation (min) (ACUTE ONLY): 21 min  Charges:                       G Codes:  Functional Assessment Tool Used: clinical judgement Functional Limitation: Mobility: Walking and moving around Mobility: Walking and Moving Around Current Status [phone]): At least 1 percent but less than 20 percent impaired, limited or restricted Mobility: Walking and Moving Around Goal Status [phone]): 0 percent impaired, limited or restricted   Mehran Guderian, Tessie Fass 12/17/2014, 5:28 PM 12/17/2014  Donnella Sham,  PT [phone]  (pager)

## 2014-12-17 NOTE — Progress Notes (Addendum)
[facility] PROGRESS NOTE  Karen [name] [id number] DOB: 04-Dec-1955 DOA: 12/15/2014 PCP: Elyn Peers, MD  Brief Summary  The patient is a 59 year old female on hemodialysis secondary to a failed kidney transplant in July 2016, still on immunosuppression who was hospitalized in early November 2016 for erythema induratum at [facility].  At that time, she was tested for HIV, hepatitis, BK virus, EBV, CMV, and C. Difficile, ANA, MPO, PR3, crypto Ag, HSV all of which were negative.  Ferritin 3564, CRP 19.7, ESR 40.  Complement levels were normal.  Her bactrim was held due to transaminitis and her valcyte was held due to leukopenia.  They anticipated starting dapson.  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    She was seen at the [facility] ED on 11/28 for fever and sore throat and discharged home.  She was admitted during his hospitalization for concern for pericarditis given suggestive appearance on CXR for pericardial effusion, however, she has not had significant chest pain.    Assessment and Plan  Sore throat, appears to have some faint spots of thrush.  She was treated for the same about a month ago -  GAS neg -  Start fluconazole and magic mouthwash with lidocaine for symptom relief  Intermittent fevers for last 6 days could be due to virus or vasculitis, not improving over last two days.   -  Blood culture negative -  CXR negative -  She has not been taking bactrim or valcyte for the last month  -  ID consult -  Check ESR, complement levels and cryoglobulin  ESRD on HD -  Hd on THSa schedule  Failed renal transplant -  Continue immunosuppression  Diet:  renal Access:  PIV IVF:  off Proph:  heparin  Code Status: full Family Communication: patient alone Disposition Plan: pending ID evaluation for  fever   Consultants:  ID  Nephrology  Procedures:  HD  Antibiotics:  Fluconazole 12/1 >    HPI/Subjective:  States that she is tired and not sure if she wants to pursue another kidney transplant, but does not really want to be obligated to do HD three times a week either.  Feels bad due to fevers, chills, ongoing sore throat that makes it difficult to swallow.  Has a mild cough.      Objective: Filed Vitals:   12/17/14 1130 12/17/14 1200 12/17/14 1206 12/17/14 1425  BP: 104/61 107/70 119/69 112/59  Pulse: 108 109 108 100  Temp:   98.2 F (36.8 C) 99.6 F (37.6 C)  TempSrc:   Oral Oral  Resp:   21 19  Height:      Weight:   85.6 kg (188 lb 11.4 oz)   SpO2:   98% 100%    Intake/Output Summary (Last 24 hours) at 12/17/14 1640 Last data filed at 12/17/14 1425  Gross per 24 hour  Intake      0 ml  Output    701 ml  Net   -701 ml   Filed Weights   12/17/14 0438 12/17/14 0809 12/17/14 1206  Weight: 84.913 kg (187 lb 3.2 oz) 85.6 kg (188 lb 11.4 oz) 85.6 kg (188 lb 11.4 oz)   Body mass index is 35.68 kg/(m^2).  Exam:   General:  Adult female, seen at HD, No acute distress  HEENT:  NCAT, MMM, has one small white plaque on soft palate that could suggest thrush, no  oral ulcers  Cardiovascular:  RRR, nl S1, S2 no mrg, 2+ pulses, warm extremities  Respiratory:  Wheezing anteriorly, no focal rhonchi or rales anteriorly, no increased WOB  Abdomen:   NABS, soft, NT/ND  MSK:   Normal tone and bulk, no LEE  Neuro:  Grossly intact  Data Reviewed: Basic Metabolic Panel:  Recent Labs Lab 12/15/14 1851 12/16/14 0540 12/17/14 0825  NA 139 140 138  K 3.8 3.9 4.0  CL 98* 102 101  CO2 $Re'27 28 24  'QmV$ GLUCOSE 108* 72 99  BUN 15 18 30*  CREATININE 3.51* 4.32* 5.91*  CALCIUM 8.5* 8.2* 8.3*  PHOS  --   --  4.6   Liver Function Tests:  Recent Labs Lab 12/15/14 1851 12/17/14 0825  AST 19  --   ALT 36  --   ALKPHOS 97  --   BILITOT 0.5  --   PROT 6.2*  --    ALBUMIN 3.2* 2.7*   No results for input(s): LIPASE, AMYLASE in the last 168 hours. No results for input(s): AMMONIA in the last 168 hours. CBC:  Recent Labs Lab 12/15/14 1851 12/16/14 0540 12/17/14 0824  WBC 8.1 4.9 12.4*  NEUTROABS 7.1  --   --   HGB 9.9* 9.1* 9.0*  HCT 31.2* 29.8* 29.4*  MCV 100.3* 101.7* 101.0*  PLT 192 176 179    Recent Results (from the past 240 hour(s))  Rapid strep screen     Status: None   Collection Time: 12/15/14  6:35 PM  Result Value Ref Range Status   Streptococcus, Group A Screen (Direct) NEGATIVE NEGATIVE Final    Comment: (NOTE) A Rapid Antigen test may result negative if the antigen level in the sample is below the detection level of this test. The FDA has not cleared this test as a stand-alone test therefore the rapid antigen negative result has reflexed to a Group A Strep culture.   Culture, Group A Strep     Status: None   Collection Time: 12/15/14  6:35 PM  Result Value Ref Range Status   Strep A Culture Negative  Final    Comment: (NOTE) Performed At: [facility] [place], [place] [id number] Lindon Romp MD WU:[id number]   Culture, blood (routine x 2)     Status: None (Preliminary result)   Collection Time: 12/15/14  6:44 PM  Result Value Ref Range Status   Specimen Description BLOOD RIGHT HAND  Final   Special Requests BOTTLES DRAWN AEROBIC AND ANAEROBIC 5CC  Final   Culture NO GROWTH 2 DAYS  Final   Report Status PENDING  Incomplete     Studies: Dg Chest 2 View  12/15/2014  CLINICAL DATA:  59 year old female with fever intact kicked during dialysis earlier today. EXAM: CHEST  2 VIEW COMPARISON:  Chest x-ray 05/30/2014. FINDINGS: Lung volumes are normal. Trace bilateral pleural effusions. No acute consolidative airspace disease. No evidence of pulmonary edema. Mild enlargement of the cardiopericardial silhouette which appears new compared to the prior study. The patient is rotated to the left on  today's exam, resulting in distortion of the mediastinal contours and reduced diagnostic sensitivity and specificity for mediastinal pathology. IMPRESSION: 1. Mild enlargement of the cardiopericardial silhouette which appears new compared to prior examinations. The appearance of this is suspicious for interval development of a pericardial effusion. Clinical correlation is recommended. 2. Trace bilateral pleural effusions. Electronically Signed   By: Vinnie Langton M.D.   On: 12/15/2014 19:38    Scheduled Meds: .  aspirin EC  81 mg Oral Daily  . HYDROcodone-acetaminophen  1 tablet Oral Once  . pantoprazole  40 mg Oral Daily  . predniSONE  5 mg Oral Q breakfast  . sodium chloride  3 mL Intravenous Q12H  . tacrolimus  2 mg Oral BID   Continuous Infusions:   Principal Problem:   Acute viral pericarditis Active Problems:   End stage renal disease on dialysis ([place])    Time spent: 30 min    Cecily Lawhorne, [place] [facility] Pager [phone]. If 7PM-7AM, please contact night-coverage at [email or web address], password [facility] 12/17/2014, 4:40 PM

## 2014-12-17 NOTE — Progress Notes (Signed)
  [facility] Progress Note   Subjective: still feels poorly, tmax 100.9, HR 111  Filed Vitals:   12/17/14 0809 12/17/14 0815 12/17/14 0830 12/17/14 0900  BP: 120/65 107/68 127/70 116/67  Pulse: 117 117 115 111  Temp: 100.2 F (37.9 C)     TempSrc: Oral     Resp: 18   22  Height:      Weight: 85.6 kg (188 lb 11.4 oz)     SpO2: 98%       Inpatient medications: . aspirin EC  81 mg Oral Daily  . [START ON 12/18/2014] heparin  4,000 Units Dialysis Once in dialysis  . HYDROcodone-acetaminophen  1 tablet Oral Once  . pantoprazole  40 mg Oral Daily  . predniSONE  5 mg Oral Q breakfast  . sodium chloride  3 mL Intravenous Q12H  . tacrolimus  2 mg Oral BID     sodium chloride, sodium chloride, acetaminophen, alteplase, heparin, lidocaine (PF), lidocaine-prilocaine, menthol-cetylpyridinium, pentafluoroprop-tetrafluoroeth, polyethylene glycol  Exam: Alert, chron ill appearing, no distress No rash, cyanosis or gangrene Sclera anicteric, throat clear No jvd Chest clear bilat RRR soft sem No rub no gallop Abd soft obese LLQ transplant nontender no mass or hsm +bs GU deferred MS no joint effusion/ deformity Ext no LE edema LUA AVG +bruit no drainage/ erythema Neuro alert ox 3 nf  MWF [facility] 4h 85.5kg 2/2.25 bath Heparin 4000 LUA AVG Venofer 50/wk  Assessment: 1 Fevers/ sore throat - in patient w recent failed transplant attempt (July) and also recent admit for LE dermal vasculitis (Nov) at [facility]. No rashes currently.  Poss URI. Per primary.   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  5 HTN - no meds 6 Vol at dry wt    Plan - HD today, keep even   Kelly Splinter MD [facility] pager [phone]    cell [phone] 12/17/2014, 9:25 AM    Recent  Labs Lab 12/15/14 1851 12/16/14 0540 12/17/14 0825  NA 139 140 138  K 3.8 3.9 4.0  CL 98* 102 101  CO2 27 28 24   GLUCOSE 108* 72 99  BUN 15 18 30*  CREATININE 3.51* 4.32* 5.91*  CALCIUM 8.5* 8.2* 8.3*  PHOS  --   --  4.6    Recent Labs Lab 12/15/14 1851 12/17/14 0825  AST 19  --   ALT 36  --   ALKPHOS 97  --   BILITOT 0.5  --   PROT 6.2*  --   ALBUMIN 3.2* 2.7*    Recent Labs Lab 12/15/14 1851 12/16/14 0540 12/17/14 0824  WBC 8.1 4.9 12.4*  NEUTROABS 7.1  --   --   HGB 9.9* 9.1* 9.0*  HCT 31.2* 29.8* 29.4*  MCV 100.3* 101.7* 101.0*  PLT 192 176 179

## 2014-12-17 NOTE — Consult Note (Addendum)
[facility] for Infectious Disease  Date of Admission:  12/15/2014  Date of Consult:  12/17/2014  Reason for Consult: Fever, vasculitis Referring Physician: Short  Impression/Recommendation Fever BCx ngtd, UCx pending She has a very active urine which I would question is from her renal disease or her vasculitis. Otherwise, would suggest it is from her minimal bladder activity.  Would continue to watch her off anbx unless there is clinical change.  Will send coxsackie and echovirus serologies with her pericaridal effusion though without ECG changes, pericarditis seems unlikely.  Send flu, send RSV  Vasculitis Continue steroids.   Thank you so much for this interesting consult,   Bobby Rumpf (pager) [phone] [email or web address]  Karen [name] is an 59 y.o. female.  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 HIV, CMV, EBV, hepatitis, BK all negative. She had a colon Bx which showed focal colitis with crypt abscesses.   She now comes to ED 11-29 with fatigue after HD.  She denies dysuria, makes small amt of urine. She has daily BM, usually after eating. She states her LE vasculitis rash has improved. She has a dry cough for 6 days.  She had been seen the day prior to adm at [facility] and was told she had a viral URI.  She is on prednisone 5 and tacrolimus 20m bid.  In [facility] she has a normal WBC, and CXR showed: 1. Mild enlargement of the cardiopericardial silhouette which appears new compared to prior examinations. The appearance of this is suspicious for interval development of a pericardial effusion. Clinical correlation is recommended. 2. Trace bilateral pleural effusions.   Past Medical History  Diagnosis Date  . Hypertension   . Chest pain Jan 2016    low risk Myoview   . GERD (gastroesophageal reflux disease)   . Complication of anesthesia 2005    difficulty remembering for  a while  . Heart murmur Nov 2015    Aortic scleosis- no stenosis  . Shortness of breath dyspnea     with exertion  . Constipation   . Arthritis     knees  . Anemia   . Pericarditis 11/2014  . ESRD on dialysis ([facility] April 2016    MWF    Past Surgical History  Procedure Laterality Date  . Rotator cuff repair Right 1997  . Cholecystectomy  2005  . Knee arthroscopy Bilateral   . Bunionectomy Bilateral   . Abdominal hysterectomy  2005  . Colonoscopy    . Av fistula placement Left 12/09/2013    Procedure: INSERTION OF ARTERIOVENOUS (AV) GORE-TEX GRAFT ARM;  Surgeon: CElam Dutch MD;  Location: [facility]  Service: Vascular;  Laterality: Left;  . 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 aspirin EC  81 mg Oral Daily  . fluconazole  200 mg Oral Daily  . HYDROcodone-acetaminophen  1 tablet Oral Once  . magic mouthwash w/lidocaine  5 mL Oral QID  . pantoprazole  40 mg Oral Daily  . predniSONE  5 mg Oral Q breakfast  . sodium chloride  3 mL Intravenous Q12H  . tacrolimus  2 mg Oral BID    Abtx:  Anti-infectives    Start     Dose/Rate Route Frequency Ordered Stop   12/17/14 1715  fluconazole (DIFLUCAN) tablet 200 mg     200  mg Oral Daily 12/17/14 1704        Total days of antibiotics: 0          Social History:  reports that she has never smoked. She has never used smokeless tobacco. She reports that she does not drink alcohol or use illicit drugs.  Family History  Problem Relation Age of Onset  . Diabetes Sister   . Heart attack Father   . Hyperlipidemia Sister   . Hypertension Sister   . Kidney disease Father   . Kidney disease Mother   . Kidney disease Sister     x2    please see HPI, 12 point ROS o/w negative  Blood pressure 112/59, pulse 100, temperature 99.6 F (37.6 C), temperature source Oral, resp. rate 19, height _0  (1.549 m), weight 85.6 kg (188 lb 11.4 oz), SpO2 100 %. General appearance: alert,  cooperative and no distress Eyes: negative findings: pupils equal, round, reactive to light and accomodation Throat: normal findings: oropharynx pink & moist without lesions or evidence of thrush Neck: no adenopathy and supple, symmetrical, trachea midline Lungs: clear to auscultation bilaterally Heart: regular rate and rhythm and systolic murmur: early systolic 3/6, crescendo at 2nd right intercostal space Abdomen: normal findings: bowel sounds normal and soft, non-tender Extremities: edema none   Results for orders placed or performed during the hospital encounter of 12/15/14 (from the past 48 hour(s))  Rapid strep screen     Status: None   Collection Time: 12/15/14  6:35 PM  Result Value Ref Range   Streptococcus, Group A Screen (Direct) NEGATIVE NEGATIVE    Comment: (NOTE) A Rapid Antigen test may result negative if the antigen level in the sample is below the detection level of this test. The FDA has not cleared this test as a stand-alone test therefore the rapid antigen negative result has reflexed to a Group A Strep culture.   Culture, Group A Strep     Status: None   Collection Time: 12/15/14  6:35 PM  Result Value Ref Range   Strep A Culture Negative     Comment: (NOTE) Performed At: [facility] [place], [place] [id number] Lindon Romp MD [id number]   Culture, blood (routine x 2)     Status: None (Preliminary result)   Collection Time: 12/15/14  6:44 PM  Result Value Ref Range   Specimen Description BLOOD RIGHT HAND    Special Requests BOTTLES DRAWN AEROBIC AND ANAEROBIC 5CC    Culture NO GROWTH 2 DAYS    Report Status PENDING   I-Stat CG4 Lactic Acid, ED     Status: None   Collection Time: 12/15/14  6:46 PM  Result Value Ref Range   Lactic Acid, Venous 1.54 0.5 - 2.0 mmol/L  CBC with Differential     Status: Abnormal   Collection Time: 12/15/14  6:51 PM  Result Value Ref Range   WBC 8.1 4.0 - 10.5 K/uL   RBC 3.11 (L) 3.87 - 5.11  MIL/uL   Hemoglobin 9.9 (L) 12.0 - 15.0 g/dL   HCT 31.2 (L) 36.0 - 46.0 %   MCV 100.3 (H) 78.0 - 100.0 fL   MCH 31.8 26.0 - 34.0 pg   MCHC 31.7 30.0 - 36.0 g/dL   RDW 14.0 11.5 - 15.5 %   Platelets 192 150 - 400 K/uL   Neutrophils Relative % 87 %   Neutro Abs 7.1 1.7 - 7.7 K/uL   Lymphocytes Relative 6 %   Lymphs Abs  0.5 (L) 0.7 - 4.0 K/uL   Monocytes Relative 6 %   Monocytes Absolute 0.5 0.1 - 1.0 K/uL   Eosinophils Relative 1 %   Eosinophils Absolute 0.1 0.0 - 0.7 K/uL   Basophils Relative 0 %   Basophils Absolute 0.0 0.0 - 0.1 K/uL  Comprehensive metabolic panel     Status: Abnormal   Collection Time: 12/15/14  6:51 PM  Result Value Ref Range   Sodium 139 135 - 145 mmol/L   Potassium 3.8 3.5 - 5.1 mmol/L   Chloride 98 (L) 101 - 111 mmol/L   CO2 27 22 - 32 mmol/L   Glucose, Bld 108 (H) 65 - 99 mg/dL   BUN 15 6 - 20 mg/dL   Creatinine, Ser 3.51 (H) 0.44 - 1.00 mg/dL   Calcium 8.5 (L) 8.9 - 10.3 mg/dL   Total Protein 6.2 (L) 6.5 - 8.1 g/dL   Albumin 3.2 (L) 3.5 - 5.0 g/dL   AST 19 15 - 41 U/L   ALT 36 14 - 54 U/L   Alkaline Phosphatase 97 38 - 126 U/L   Total Bilirubin 0.5 0.3 - 1.2 mg/dL   GFR calc non Af Amer 13 (L) >60 mL/min   GFR calc Af Amer 15 (L) >60 mL/min    Comment: (NOTE) The eGFR has been calculated using the CKD EPI equation. This calculation has not been validated in all clinical situations. eGFR's persistently <60 mL/min signify possible Chronic Kidney Disease.    Anion gap 14 5 - 15  I-Stat CG4 Lactic Acid, ED     Status: None   Collection Time: 12/15/14  8:48 PM  Result Value Ref Range   Lactic Acid, Venous 0.60 0.5 - 2.0 mmol/L  CBC     Status: Abnormal   Collection Time: 12/16/14  5:40 AM  Result Value Ref Range   WBC 4.9 4.0 - 10.5 K/uL   RBC 2.93 (L) 3.87 - 5.11 MIL/uL   Hemoglobin 9.1 (L) 12.0 - 15.0 g/dL   HCT 29.8 (L) 36.0 - 46.0 %   MCV 101.7 (H) 78.0 - 100.0 fL   MCH 31.1 26.0 - 34.0 pg   MCHC 30.5 30.0 - 36.0 g/dL   RDW 14.0 11.5 -  15.5 %   Platelets 176 150 - 400 K/uL  Basic metabolic panel     Status: Abnormal   Collection Time: 12/16/14  5:40 AM  Result Value Ref Range   Sodium 140 135 - 145 mmol/L   Potassium 3.9 3.5 - 5.1 mmol/L   Chloride 102 101 - 111 mmol/L   CO2 28 22 - 32 mmol/L   Glucose, Bld 72 65 - 99 mg/dL   BUN 18 6 - 20 mg/dL   Creatinine, Ser 4.32 (H) 0.44 - 1.00 mg/dL   Calcium 8.2 (L) 8.9 - 10.3 mg/dL   GFR calc non Af Amer 10 (L) >60 mL/min   GFR calc Af Amer 12 (L) >60 mL/min    Comment: (NOTE) The eGFR has been calculated using the CKD EPI equation. This calculation has not been validated in all clinical situations. eGFR's persistently <60 mL/min signify possible Chronic Kidney Disease.    Anion gap 10 5 - 15  CBC     Status: Abnormal   Collection Time: 12/17/14  8:24 AM  Result Value Ref Range   WBC 12.4 (H) 4.0 - 10.5 K/uL   RBC 2.91 (L) 3.87 - 5.11 MIL/uL   Hemoglobin 9.0 (L) 12.0 - 15.0 g/dL   HCT  29.4 (L) 36.0 - 46.0 %   MCV 101.0 (H) 78.0 - 100.0 fL   MCH 30.9 26.0 - 34.0 pg   MCHC 30.6 30.0 - 36.0 g/dL   RDW 14.1 11.5 - 15.5 %   Platelets 179 150 - 400 K/uL  Renal function panel     Status: Abnormal   Collection Time: 12/17/14  8:25 AM  Result Value Ref Range   Sodium 138 135 - 145 mmol/L   Potassium 4.0 3.5 - 5.1 mmol/L   Chloride 101 101 - 111 mmol/L   CO2 24 22 - 32 mmol/L   Glucose, Bld 99 65 - 99 mg/dL   BUN 30 (H) 6 - 20 mg/dL   Creatinine, Ser 5.91 (H) 0.44 - 1.00 mg/dL   Calcium 8.3 (L) 8.9 - 10.3 mg/dL   Phosphorus 4.6 2.5 - 4.6 mg/dL   Albumin 2.7 (L) 3.5 - 5.0 g/dL   GFR calc non Af Amer 7 (L) >60 mL/min   GFR calc Af Amer 8 (L) >60 mL/min    Comment: (NOTE) The eGFR has been calculated using the CKD EPI equation. This calculation has not been validated in all clinical situations. eGFR's persistently <60 mL/min signify possible Chronic Kidney Disease.    Anion gap 13 5 - 15  Urinalysis, Routine w reflex microscopic (not at [facility])     Status: Abnormal    Collection Time: 12/17/14  3:50 PM  Result Value Ref Range   Color, Urine YELLOW YELLOW   APPearance TURBID (A) CLEAR   Specific Gravity, Urine 1.011 1.005 - 1.030   pH 8.5 (H) 5.0 - 8.0   Glucose, UA NEGATIVE NEGATIVE mg/dL   Hgb urine dipstick LARGE (A) NEGATIVE   Bilirubin Urine NEGATIVE NEGATIVE   Ketones, ur NEGATIVE NEGATIVE mg/dL   Protein, ur 100 (A) NEGATIVE mg/dL   Nitrite NEGATIVE NEGATIVE   Leukocytes, UA LARGE (A) NEGATIVE  Urine microscopic-add on     Status: Abnormal   Collection Time: 12/17/14  3:50 PM  Result Value Ref Range   Squamous Epithelial / LPF 6-30 (A) NONE SEEN   WBC, UA TOO NUMEROUS TO COUNT 0 - 5 WBC/hpf   RBC / HPF TOO NUMEROUS TO COUNT 0 - 5 RBC/hpf   Bacteria, UA MANY (A) NONE SEEN      Component Value Date/Time   SDES BLOOD RIGHT HAND 12/15/2014 1844   SPECREQUEST BOTTLES DRAWN AEROBIC AND ANAEROBIC 5CC 12/15/2014 1844   CULT NO GROWTH 2 DAYS 12/15/2014 1844   REPTSTATUS PENDING 12/15/2014 1844   Dg Chest 2 View  12/15/2014  CLINICAL DATA:  59 year old female with fever intact kicked during dialysis earlier today. EXAM: CHEST  2 VIEW COMPARISON:  Chest x-ray 05/30/2014. FINDINGS: Lung volumes are normal. Trace bilateral pleural effusions. No acute consolidative airspace disease. No evidence of pulmonary edema. Mild enlargement of the cardiopericardial silhouette which appears new compared to the prior study. The patient is rotated to the left on today's exam, resulting in distortion of the mediastinal contours and reduced diagnostic sensitivity and specificity for mediastinal pathology. IMPRESSION: 1. Mild enlargement of the cardiopericardial silhouette which appears new compared to prior examinations. The appearance of this is suspicious for interval development of a pericardial effusion. Clinical correlation is recommended. 2. Trace bilateral pleural effusions. Electronically Signed   By: Vinnie Langton M.D.   On: 12/15/2014 19:38   Recent  Results (from the past 240 hour(s))  Rapid strep screen     Status: None   Collection Time:  12/15/14  6:35 PM  Result Value Ref Range Status   Streptococcus, Group A Screen (Direct) NEGATIVE NEGATIVE Final    Comment: (NOTE) A Rapid Antigen test may result negative if the antigen level in the sample is below the detection level of this test. The FDA has not cleared this test as a stand-alone test therefore the rapid antigen negative result has reflexed to a Group A Strep culture.   Culture, Group A Strep     Status: None   Collection Time: 12/15/14  6:35 PM  Result Value Ref Range Status   Strep A Culture Negative  Final    Comment: (NOTE) Performed At: [facility] [address] [place], [place] [id number] Lindon Romp MD [id number]   Culture, blood (routine x 2)     Status: None (Preliminary result)   Collection Time: 12/15/14  6:44 PM  Result Value Ref Range Status   Specimen Description BLOOD RIGHT HAND  Final   Special Requests BOTTLES DRAWN AEROBIC AND ANAEROBIC 5CC  Final   Culture NO GROWTH 2 DAYS  Final   Report Status PENDING  Incomplete      12/17/2014, 5:29 PM        Records and images were personally reviewed where available.

## 2014-12-18 ENCOUNTER — Observation Stay (HOSPITAL_COMMUNITY): Payer: 59

## 2014-12-18 DIAGNOSIS — R011 Cardiac murmur, unspecified: Secondary | ICD-10-CM | POA: Diagnosis present

## 2014-12-18 DIAGNOSIS — B961 Klebsiella pneumoniae [K. pneumoniae] as the cause of diseases classified elsewhere: Secondary | ICD-10-CM | POA: Diagnosis present

## 2014-12-18 DIAGNOSIS — I776 Arteritis, unspecified: Secondary | ICD-10-CM | POA: Diagnosis present

## 2014-12-18 DIAGNOSIS — R0600 Dyspnea, unspecified: Secondary | ICD-10-CM | POA: Diagnosis present

## 2014-12-18 DIAGNOSIS — R05 Cough: Secondary | ICD-10-CM

## 2014-12-18 DIAGNOSIS — Z7982 Long term (current) use of aspirin: Secondary | ICD-10-CM | POA: Diagnosis not present

## 2014-12-18 DIAGNOSIS — T8612 Kidney transplant failure: Secondary | ICD-10-CM | POA: Diagnosis present

## 2014-12-18 DIAGNOSIS — R160 Hepatomegaly, not elsewhere classified: Secondary | ICD-10-CM | POA: Diagnosis present

## 2014-12-18 DIAGNOSIS — Z841 Family history of disorders of kidney and ureter: Secondary | ICD-10-CM | POA: Diagnosis not present

## 2014-12-18 DIAGNOSIS — Z940 Kidney transplant status: Secondary | ICD-10-CM | POA: Diagnosis not present

## 2014-12-18 DIAGNOSIS — N12 Tubulo-interstitial nephritis, not specified as acute or chronic: Secondary | ICD-10-CM | POA: Diagnosis present

## 2014-12-18 DIAGNOSIS — A419 Sepsis, unspecified organism: Secondary | ICD-10-CM | POA: Diagnosis not present

## 2014-12-18 DIAGNOSIS — K219 Gastro-esophageal reflux disease without esophagitis: Secondary | ICD-10-CM | POA: Diagnosis present

## 2014-12-18 DIAGNOSIS — I959 Hypotension, unspecified: Secondary | ICD-10-CM | POA: Diagnosis present

## 2014-12-18 DIAGNOSIS — I120 Hypertensive chronic kidney disease with stage 5 chronic kidney disease or end stage renal disease: Secondary | ICD-10-CM | POA: Diagnosis present

## 2014-12-18 DIAGNOSIS — L959 Vasculitis limited to the skin, unspecified: Secondary | ICD-10-CM | POA: Diagnosis not present

## 2014-12-18 DIAGNOSIS — Z79899 Other long term (current) drug therapy: Secondary | ICD-10-CM | POA: Diagnosis not present

## 2014-12-18 DIAGNOSIS — N390 Urinary tract infection, site not specified: Secondary | ICD-10-CM | POA: Diagnosis not present

## 2014-12-18 DIAGNOSIS — L52 Erythema nodosum: Secondary | ICD-10-CM | POA: Diagnosis not present

## 2014-12-18 DIAGNOSIS — A4159 Other Gram-negative sepsis: Secondary | ICD-10-CM | POA: Diagnosis present

## 2014-12-18 DIAGNOSIS — B370 Candidal stomatitis: Secondary | ICD-10-CM | POA: Diagnosis present

## 2014-12-18 DIAGNOSIS — Y830 Surgical operation with transplant of whole organ as the cause of abnormal reaction of the patient, or of later complication, without mention of misadventure at the time of the procedure: Secondary | ICD-10-CM | POA: Diagnosis present

## 2014-12-18 DIAGNOSIS — N186 End stage renal disease: Secondary | ICD-10-CM | POA: Diagnosis present

## 2014-12-18 DIAGNOSIS — R509 Fever, unspecified: Secondary | ICD-10-CM | POA: Diagnosis not present

## 2014-12-18 DIAGNOSIS — Z992 Dependence on renal dialysis: Secondary | ICD-10-CM | POA: Diagnosis not present

## 2014-12-18 DIAGNOSIS — D649 Anemia, unspecified: Secondary | ICD-10-CM | POA: Diagnosis present

## 2014-12-18 DIAGNOSIS — Z7952 Long term (current) use of systemic steroids: Secondary | ICD-10-CM | POA: Diagnosis not present

## 2014-12-18 LAB — COMPREHENSIVE METABOLIC PANEL
ALBUMIN: 2.5 g/dL — AB (ref 3.5–5.0)
ALT: 17 U/L (ref 14–54)
AST: 13 U/L — AB (ref 15–41)
Alkaline Phosphatase: 75 U/L (ref 38–126)
Anion gap: 11 (ref 5–15)
BUN: 17 mg/dL (ref 6–20)
CHLORIDE: 102 mmol/L (ref 101–111)
CO2: 26 mmol/L (ref 22–32)
Calcium: 8.3 mg/dL — ABNORMAL LOW (ref 8.9–10.3)
Creatinine, Ser: 4.3 mg/dL — ABNORMAL HIGH (ref 0.44–1.00)
GFR calc Af Amer: 12 mL/min — ABNORMAL LOW (ref >60)
GFR, EST NON AFRICAN AMERICAN: 10 mL/min — AB (ref >60)
GLUCOSE: 115 mg/dL — AB (ref 65–99)
POTASSIUM: 4.2 mmol/L (ref 3.5–5.1)
SODIUM: 139 mmol/L (ref 135–145)
Total Bilirubin: 0.4 mg/dL (ref 0.3–1.2)
Total Protein: 5.7 g/dL — ABNORMAL LOW (ref 6.5–8.1)

## 2014-12-18 LAB — PROTIME-INR
INR: 1.16 (ref 0.00–1.49)
PROTHROMBIN TIME: 15 s (ref 11.6–15.2)

## 2014-12-18 LAB — CBC WITH DIFFERENTIAL/PLATELET
BASOS ABS: 0 10*3/uL (ref 0.0–0.1)
BASOS PCT: 0 %
EOS ABS: 0.1 10*3/uL (ref 0.0–0.7)
EOS PCT: 1 %
HCT: 26.3 % — ABNORMAL LOW (ref 36.0–46.0)
Hemoglobin: 8.3 g/dL — ABNORMAL LOW (ref 12.0–15.0)
Lymphocytes Relative: 4 %
Lymphs Abs: 0.4 10*3/uL — ABNORMAL LOW (ref 0.7–4.0)
MCH: 32.3 pg (ref 26.0–34.0)
MCHC: 31.6 g/dL (ref 30.0–36.0)
MCV: 102.3 fL — ABNORMAL HIGH (ref 78.0–100.0)
MONO ABS: 0.7 10*3/uL (ref 0.1–1.0)
Monocytes Relative: 8 %
Neutro Abs: 7.6 10*3/uL (ref 1.7–7.7)
Neutrophils Relative %: 87 %
PLATELETS: 180 10*3/uL (ref 150–400)
RBC: 2.57 MIL/uL — ABNORMAL LOW (ref 3.87–5.11)
RDW: 14.1 % (ref 11.5–15.5)
WBC: 8.7 10*3/uL (ref 4.0–10.5)

## 2014-12-18 LAB — PROCALCITONIN: Procalcitonin: 1.71 ng/mL

## 2014-12-18 LAB — HEPATITIS B SURFACE ANTIGEN: Hepatitis B Surface Ag: NEGATIVE

## 2014-12-18 LAB — C4 COMPLEMENT: COMPLEMENT C4, BODY FLUID: 37 mg/dL (ref 14–44)

## 2014-12-18 LAB — LACTIC ACID, PLASMA
LACTIC ACID, VENOUS: 0.9 mmol/L (ref 0.5–2.0)
Lactic Acid, Venous: 2 mmol/L (ref 0.5–2.0)

## 2014-12-18 LAB — C3 COMPLEMENT: C3 COMPLEMENT: 137 mg/dL (ref 82–167)

## 2014-12-18 LAB — APTT: aPTT: 41 seconds — ABNORMAL HIGH (ref 24–37)

## 2014-12-18 MED ORDER — PIPERACILLIN-TAZOBACTAM IN DEX 2-0.25 GM/50ML IV SOLN
2.2500 g | Freq: Three times a day (TID) | INTRAVENOUS | Status: DC
  Filled 2014-12-18: qty 50

## 2014-12-18 MED ORDER — VANCOMYCIN HCL 10 G IV SOLR
2000.0000 mg | Freq: Once | INTRAVENOUS | Status: AC
  Administered 2014-12-18: 2000 mg via INTRAVENOUS
  Filled 2014-12-18: qty 2000

## 2014-12-18 MED ORDER — DEXTROSE 5 % IV SOLN
2.0000 g | INTRAVENOUS | Status: DC
  Administered 2014-12-19: 2 g via INTRAVENOUS
  Filled 2014-12-18 (×3): qty 2

## 2014-12-18 MED ORDER — DEXTROSE 5 % IV SOLN
1.0000 g | INTRAVENOUS | Status: DC
  Filled 2014-12-18: qty 10

## 2014-12-18 MED ORDER — SODIUM CHLORIDE 0.9 % IV BOLUS (SEPSIS): 1000.0000 mL | INTRAVENOUS | Status: DC

## 2014-12-18 MED ORDER — VANCOMYCIN HCL IN DEXTROSE 1-5 GM/200ML-% IV SOLN
1000.0000 mg | INTRAVENOUS | Status: DC
  Administered 2014-12-19: 1000 mg via INTRAVENOUS
  Filled 2014-12-18 (×2): qty 200

## 2014-12-18 MED ORDER — DEXTROSE 5 % IV SOLN
2.0000 g | Freq: Once | INTRAVENOUS | Status: AC
  Administered 2014-12-18: 2 g via INTRAVENOUS
  Filled 2014-12-18: qty 2

## 2014-12-18 MED ORDER — PIPERACILLIN-TAZOBACTAM 3.375 G IVPB 30 MIN
3.3750 g | Freq: Once | INTRAVENOUS | Status: DC
  Filled 2014-12-18: qty 50

## 2014-12-18 MED ORDER — SODIUM CHLORIDE 3 % IN NEBU
3.0000 mL | INHALATION_SOLUTION | Freq: Every day | RESPIRATORY_TRACT | Status: AC
  Administered 2014-12-18 – 2014-12-20 (×3): 3 mL via RESPIRATORY_TRACT
  Filled 2014-12-18 (×3): qty 4

## 2014-12-18 MED ORDER — PIPERACILLIN-TAZOBACTAM IN DEX 2-0.25 GM/50ML IV SOLN
2.2500 g | Freq: Once | INTRAVENOUS | Status: DC
  Filled 2014-12-18: qty 50

## 2014-12-18 MED ORDER — VANCOMYCIN HCL IN DEXTROSE 1-5 GM/200ML-% IV SOLN
1000.0000 mg | Freq: Once | INTRAVENOUS | Status: DC
Start: 2014-12-18 — End: 2014-12-18
  Filled 2014-12-18: qty 200

## 2014-12-18 NOTE — Progress Notes (Addendum)
INFECTIOUS DISEASE PROGRESS NOTE  ID: Karen [name] is a 59 y.o. female with  Principal Problem:   Fever, unspecified Active Problems:   End stage renal disease on dialysis (HCC)   Thrush   Erythema induratum   Febrile illness  Subjective: Cont dry cough Feels poorly Fever overnight, hypotension this AM Abtx:  Anti-infectives    Start     Dose/Rate Route Frequency Ordered Stop   12/19/14 1200  vancomycin (VANCOCIN) IVPB 1000 mg/200 mL premix     1,000 mg 200 mL/hr over 60 Minutes Intravenous Every T-Th-Sa (Hemodialysis) 12/18/14 0824     12/19/14 1200  cefTAZidime (FORTAZ) 2 g in dextrose 5 % 50 mL IVPB     2 g 100 mL/hr over 30 Minutes Intravenous Every T-Th-Sa (Hemodialysis) 12/18/14 0925     12/18/14 1600  piperacillin-tazobactam (ZOSYN) IVPB 2.25 g  Status:  Discontinued     2.25 g 100 mL/hr over 30 Minutes Intravenous Every 8 hours 12/18/14 0824 12/18/14 0909   12/18/14 0930  cefTAZidime (FORTAZ) 2 g in dextrose 5 % 50 mL IVPB     2 g 100 mL/hr over 30 Minutes Intravenous  Once 12/18/14 0924     12/18/14 0830  vancomycin (VANCOCIN) 2,000 mg in sodium chloride 0.9 % 500 mL IVPB     2,000 mg 250 mL/hr over 120 Minutes Intravenous  Once 12/18/14 0824     12/18/14 0830  piperacillin-tazobactam (ZOSYN) IVPB 2.25 g  Status:  Discontinued     2.25 g 100 mL/hr over 30 Minutes Intravenous  Once 12/18/14 0824 12/18/14 0909   12/18/14 0800  cefTRIAXone (ROCEPHIN) 1 g in dextrose 5 % 50 mL IVPB  Status:  Discontinued     1 g 100 mL/hr over 30 Minutes Intravenous Every 24 hours 12/18/14 0713 12/18/14 0735   12/18/14 0800  vancomycin (VANCOCIN) IVPB 1000 mg/200 mL premix  Status:  Discontinued     1,000 mg 200 mL/hr over 60 Minutes Intravenous  Once 12/18/14 0735 12/18/14 0917   12/18/14 0745  piperacillin-tazobactam (ZOSYN) IVPB 3.375 g  Status:  Discontinued     3.375 g 100 mL/hr over 30 Minutes Intravenous  Once 12/18/14 0735 12/18/14 0847   12/17/14 1800  fluconazole  (DIFLUCAN) tablet 200 mg     200 mg Oral Daily 12/17/14 1704        Medications:  Scheduled: . aspirin EC  81 mg Oral Daily  . cefTAZidime (FORTAZ)  IV  2 g Intravenous Once  . [START ON 12/19/2014] cefTAZidime (FORTAZ)  IV  2 g Intravenous Q T,Th,Sa-HD  . fluconazole  200 mg Oral Daily  . HYDROcodone-acetaminophen  1 tablet Oral Once  . magic mouthwash w/lidocaine  5 mL Oral QID  . pantoprazole  40 mg Oral Daily  . predniSONE  5 mg Oral Q breakfast  . sodium chloride  3 mL Intravenous Q12H  . tacrolimus  2 mg Oral BID  . vancomycin  2,000 mg Intravenous Once  . [START ON 12/19/2014] vancomycin  1,000 mg Intravenous Q T,Th,Sa-HD    Objective: Vital signs in last 24 hours: Temp:  [98.2 F (36.8 C)-101 F (38.3 C)] 99.5 F (37.5 C) (12/02 0552) Pulse Rate:  [100-123] 111 (12/02 0747) Resp:  [18-21] 21 (12/02 0552) BP: (94-119)/(53-70) 118/64 mmHg (12/02 0747) SpO2:  [98 %-100 %] 100 % (12/02 0552) Weight:  [84.641 kg (186 lb 9.6 oz)-85.6 kg (188 lb 11.4 oz)] 84.641 kg (186 lb 9.6 oz) (12/02 0552)   General appearance:  alert, cooperative and fatigued Resp: diminished breath sounds bilaterally Cardio: tachycardia with murmur GI: normal findings: bowel sounds normal and soft, non-tender RUE AVF is warm, swollen. Non-tender. No gross fluctuance.   Lab Results  Recent Labs  12/16/14 0540 12/17/14 0824 12/17/14 0825 12/18/14 0904  WBC 4.9 12.4*  --  8.7  HGB 9.1* 9.0*  --  8.3*  HCT 29.8* 29.4*  --  26.3*  NA 140  --  138  --   K 3.9  --  4.0  --   CL 102  --  101  --   CO2 28  --  24  --   BUN 18  --  30*  --   CREATININE 4.32*  --  5.91*  --    Liver Panel  Recent Labs  12/15/14 1851 12/17/14 0825  PROT 6.2*  --   ALBUMIN 3.2* 2.7*  AST 19  --   ALT 36  --   ALKPHOS 97  --   BILITOT 0.5  --    Sedimentation Rate  Recent Labs  12/17/14 1813  ESRSEDRATE 136*   C-Reactive Protein  Recent Labs  12/17/14 1813  CRP 32.1*    Microbiology: Recent  Results (from the past 240 hour(s))  Rapid strep screen     Status: None   Collection Time: 12/15/14  6:35 PM  Result Value Ref Range Status   Streptococcus, Group A Screen (Direct) NEGATIVE NEGATIVE Final    Comment: (NOTE) A Rapid Antigen test may result negative if the antigen level in the sample is below the detection level of this test. The FDA has not cleared this test as a stand-alone test therefore the rapid antigen negative result has reflexed to a Group A Strep culture.   Culture, Group A Strep     Status: None   Collection Time: 12/15/14  6:35 PM  Result Value Ref Range Status   Strep A Culture Negative  Final    Comment: (NOTE) Performed At: [facility] [place], [place] [id number] Lindon Romp MD [id number]   Culture, blood (routine x 2)     Status: None (Preliminary result)   Collection Time: 12/15/14  6:44 PM  Result Value Ref Range Status   Specimen Description BLOOD RIGHT HAND  Final   Special Requests BOTTLES DRAWN AEROBIC AND ANAEROBIC 5CC  Final   Culture NO GROWTH 2 DAYS  Final   Report Status PENDING  Incomplete    Studies/Results: Dg Chest Port 1 View  12/18/2014  CLINICAL DATA:  Nonproductive cough, fever. EXAM: PORTABLE CHEST 1 VIEW COMPARISON:  December 15, 2014. FINDINGS: Stable cardiomediastinal silhouette. No pneumothorax or pleural effusion is noted. No acute pulmonary disease is noted. Stable elevation of left hemidiaphragm is noted. Bony thorax is unremarkable. IMPRESSION: No acute cardiopulmonary abnormality seen. Electronically Signed   By: Marijo Conception, M.D.   On: 12/18/2014 08:13     Assessment/Plan: Fever Started on vanco/ceftaz this am by hospitalists Await her Cx Will ask respiratory to induce sputum Check legionella, consider adding macrolide (will interact with her immuno-suppressants, not clear yet this is needed)  Echo, coxsackie serologies, respiratory virus panel pending Her AVF site is swollen,  warm. Non-tender. Will watch.   Vasculitis  ESRD, renal txp on immunosuppresants apprecaite renal f/u  Total days of antibiotics: 0 vanco/ceftaz         Bobby Rumpf Infectious Diseases (pager) [phone] [email or web address] 12/18/2014, 10:05 AM

## 2014-12-18 NOTE — Progress Notes (Signed)
Hypertonic saline given via nebulizer for sputum induction. Patient has a dry nonproductive cough at this time. BBS are clear.

## 2014-12-18 NOTE — Progress Notes (Signed)
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  At that time, she was tested for HIV, hepatitis, BK virus, EBV, CMV, and C. Difficile, ANA, MPO, PR3, crypto Ag, HSV all of which were negative.  Ferritin 3564, CRP 19.7, ESR 40.  Complement levels were normal.  Her bactrim was held due to transaminitis and her valcyte was held due to leukopenia.  They anticipated starting dapson.  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    She was seen at the [facility] ED on 11/28 for fever and sore throat and discharged home.  She was admitted during his hospitalization for concern for pericarditis given suggestive appearance on CXR for pericardial effusion, however, she has not had significant chest pain.    Assessment and Plan  Sore throat, appears to have some faint spots of thrush.  She was treated for the same about a month ago -  GAS neg -  Start fluconazole and magic mouthwash with lidocaine for symptom relief  Sepsis (tachycardia, tachypnea, fever 101F) secondary to pyelonephritis/UTI.   DDx also includes underlying vasculitis given very elevated ESR.  -  Blood culture NGTD -  CXR negative -  She has not been taking bactrim or valcyte for the last month  -  ID assistance appreciated -  ESR 136, complement levels wnl and cryoglobulin pending -  Start sepsis protocol:  vanc and ceftaz -  UA positive and UCx growing >100 000 GNR  ESRD on HD -  Hd on THSa schedule  Failed renal transplant -  Continue immunosuppression  Diet:  renal Access:  PIV IVF:  off Proph:  heparin  Code Status: full Family Communication:  patient alone Disposition Plan:  Pending further treatment for sepsis   Consultants:  ID  Nephrology  Procedures:  HD  Antibiotics:  Fluconazole 12/1 >    vanc and fortaz 12/2  HPI/Subjective:   Feels bad due to fevers, chills, ongoing sore throat that makes it difficult to swallow.  Has a mild cough.  Dysuria and hematuria this morning.  Was increasingly hypotensive and tachycardic this morning.  Objective: Filed Vitals:   12/18/14 0747 12/18/14 1115 12/18/14 1307 12/18/14 1324  BP: 118/64 113/56    Pulse: 111 113    Temp:  100.4 F (38 C)  99.5 F (37.5 C)  TempSrc:  Oral  Oral  Resp:      Height:      Weight:      SpO2:  98% 100%     Intake/Output Summary (Last 24 hours) at 12/18/14 1522 Last data filed at 12/18/14 1517  Gross per 24 hour  Intake    820 ml  Output    877 ml  Net    -57 ml   Filed Weights   12/17/14 0809 12/17/14 1206 12/18/14 0552  Weight: 85.6 kg (188 lb 11.4 oz) 85.6 kg (188 lb 11.4 oz) 84.641 kg (186 lb 9.6 oz)   Body mass index is 35.28 kg/(m^2).  Exam:   General:  Adult female, ill-appearing  HEENT:  NCAT, MMM, no further evidence of thrush   Cardiovascular:  Tachycardic to 120s, RR, nl S1, S2 no mrg, 2+  pulses, warm extremities  Respiratory:  CTAB, no increased WOB  Abdomen:   NABS, soft, NT/ND  MSK:   Normal tone and bulk, no LEE  Neuro:  Grossly intact  Data Reviewed: Basic Metabolic Panel:  Recent Labs Lab 12/15/14 1851 12/16/14 0540 12/17/14 0825 12/18/14 0904  NA 139 140 138 139  K 3.8 3.9 4.0 4.2  CL 98* 102 101 102  CO2 _0 GLUCOSE 108* 72 99 115*  BUN 15 18 30* 17  CREATININE 3.51* 4.32* 5.91* 4.30*  CALCIUM 8.5* 8.2* 8.3* 8.3*  PHOS  --   --  4.6  --    Liver Function Tests:  Recent Labs Lab 12/15/14 1851 12/17/14 0825 12/18/14 0904  AST 19  --  13*  ALT 36  --  17  ALKPHOS 97  --  75  BILITOT 0.5  --  0.4  PROT 6.2*  --  5.7*  ALBUMIN 3.2* 2.7* 2.5*   No results for  input(s): LIPASE, AMYLASE in the last 168 hours. No results for input(s): AMMONIA in the last 168 hours. CBC:  Recent Labs Lab 12/15/14 1851 12/16/14 0540 12/17/14 0824 12/18/14 0904  WBC 8.1 4.9 12.4* 8.7  NEUTROABS 7.1  --   --  7.6  HGB 9.9* 9.1* 9.0* 8.3*  HCT 31.2* 29.8* 29.4* 26.3*  MCV 100.3* 101.7* 101.0* 102.3*  PLT 192 176 179 180    Recent Results (from the past 240 hour(s))  Rapid strep screen     Status: None   Collection Time: 12/15/14  6:35 PM  Result Value Ref Range Status   Streptococcus, Group A Screen (Direct) NEGATIVE NEGATIVE Final    Comment: (NOTE) A Rapid Antigen test may result negative if the antigen level in the sample is below the detection level of this test. The FDA has not cleared this test as a stand-alone test therefore the rapid antigen negative result has reflexed to a Group A Strep culture.   Culture, Group A Strep     Status: None   Collection Time: 12/15/14  6:35 PM  Result Value Ref Range Status   Strep A Culture Negative  Final    Comment: (NOTE) Performed At: [facility] [place], [place] [id number] Lindon Romp MD [id number]   Culture, blood (routine x 2)     Status: None (Preliminary result)   Collection Time: 12/15/14  6:44 PM  Result Value Ref Range Status   Specimen Description BLOOD RIGHT HAND  Final   Special Requests BOTTLES DRAWN AEROBIC AND ANAEROBIC 5CC  Final   Culture NO GROWTH 3 DAYS  Final   Report Status PENDING  Incomplete  Culture, Urine     Status: None (Preliminary result)   Collection Time: 12/17/14  3:50 PM  Result Value Ref Range Status   Specimen Description URINE, CLEAN CATCH  Final   Special Requests NONE  Final   Culture >=100,000 COLONIES/mL GRAM NEGATIVE RODS  Final   Report Status PENDING  Incomplete     Studies: Dg Chest Port 1 View  12/18/2014  CLINICAL DATA:  Nonproductive cough, fever. EXAM: PORTABLE CHEST 1 VIEW COMPARISON:  December 15, 2014. FINDINGS:  Stable cardiomediastinal silhouette. No pneumothorax or pleural effusion is noted. No acute pulmonary disease is noted. Stable elevation of left hemidiaphragm is noted. Bony thorax is unremarkable. IMPRESSION: No acute cardiopulmonary abnormality seen. Electronically Signed   By: Marijo Conception, M.D.   On: 12/18/2014 08:13    Scheduled  Meds: . aspirin EC  81 mg Oral Daily  . [START ON 12/19/2014] cefTAZidime (FORTAZ)  IV  2 g Intravenous Q T,Th,Sa-HD  . fluconazole  200 mg Oral Daily  . HYDROcodone-acetaminophen  1 tablet Oral Once  . magic mouthwash w/lidocaine  5 mL Oral QID  . pantoprazole  40 mg Oral Daily  . predniSONE  5 mg Oral Q breakfast  . sodium chloride  3 mL Intravenous Q12H  . sodium chloride HYPERTONIC  3 mL Nebulization Daily  . tacrolimus  2 mg Oral BID  . [START ON 12/19/2014] vancomycin  1,000 mg Intravenous Q T,Th,Sa-HD   Continuous Infusions:   Principal Problem:   Fever, unspecified Active Problems:   End stage renal disease on dialysis ([place])   Thrush   Erythema induratum   Febrile illness   Fever    Time spent: 30 min    Adalena Abdulla, [facility] Pager [phone]. If 7PM-7AM, please contact night-coverage at [email or web address], password [facility] 12/18/2014, 3:22 PM  LOS: 0 days

## 2014-12-18 NOTE — Consult Note (Deleted)
[facility] Progress Note  Assessment/Plan: 1.Fevers/ sore throat - in patient w recent failed transplant attempt (July) hosptialized at [facility] 11-2014 with vasculitis. Strep A negative. Infectious disease following Tmax 101. Droplet isolation. Vanc/Fortaz/diflucan.   2 ESRD HD MWF-Had HD yesterday, will have HD Saturday and again 12/05 to get back on schedule 3 Failed renal transplant Jul '16. ContinuePrednisone/tacrolimus 4 Recent vasculitis episode - had bilat LE rash assoc w severe leukopenia at [facility], the w/u didn't show any pos testing (HIV, CMV, EBV, hepatitis, BK and Cdif), WBC rebounded.  5 HTN - no meds 6 Vol HD yesterday. Net UF 0 Post wt 85.6. Is currently at OP EDW.  7. Anemia: HGB 8.3. Will start ESA-was not taking OP. Follow HGB.   Rita H. Brown NP-C 12/18/2014, 10:40 AM  [employer] [phone]  Subjective:  "I don't feel good".  Patient has nonproductive dry cough. Denies chest pain/SOB.    Objective Filed Vitals:   12/17/14 1946 12/17/14 2204 12/18/14 0552 12/18/14 0747  BP: 113/53  94/59 118/64  Pulse: 123  121 111  Temp: 101 F (38.3 C) 99.8 F (37.7 C) 99.5 F (37.5 C)   TempSrc: Oral Oral Oral   Resp: 18  21   Height:      Weight:   84.641 kg (186 lb 9.6 oz)   SpO2: 100%  100%    Physical Exam General: Chronically ill appearing female NAD Heart: S1, S2, RRR. No M/R/G.  Lungs: Bilateral breath sounds decreased in bases CTA AP Abdomen: Soft nontender Extremities: No LE Edema Dialysis Access: LUA AVG + thrill/+ Bruit. AVF does not appear swollen, is slightly discolored, pt states "It's the same as it's always been".   Dialysis Orders: MonWedFri, 4 hrs 0 min, 180NRe Optiflux, BFR 400, DFR Autoflow 1.5, EDW 85.5 (kg), Dialysate 2.0 K, 2.25 Ca, UFR Profile: None, Sodium Model: None, Access: LUA  AV Graft Heparin: 4000 units per treatment Venofer: 50 mg IV weekly (12/11/14)  Additional Objective Labs: Basic Metabolic  Panel:  Recent Labs Lab 12/16/14 0540 12/17/14 0825 12/18/14 0904  NA 140 138 139  K 3.9 4.0 4.2  CL 102 101 102  CO2 28 24 26   GLUCOSE 72 99 115*  BUN 18 30* 17  CREATININE 4.32* 5.91* 4.30*  CALCIUM 8.2* 8.3* 8.3*  PHOS  --  4.6  --    Liver Function Tests:  Recent Labs Lab 12/15/14 1851 12/17/14 0825 12/18/14 0904  AST 19  --  13*  ALT 36  --  17  ALKPHOS 97  --  75  BILITOT 0.5  --  0.4  PROT 6.2*  --  5.7*  ALBUMIN 3.2* 2.7* 2.5*   No results for input(s): LIPASE, AMYLASE in the last 168 hours. CBC:  Recent Labs Lab 12/15/14 1851 12/16/14 0540 12/17/14 0824 12/18/14 0904  WBC 8.1 4.9 12.4* 8.7  NEUTROABS 7.1  --   --  7.6  HGB 9.9* 9.1* 9.0* 8.3*  HCT 31.2* 29.8* 29.4* 26.3*  MCV 100.3* 101.7* 101.0* 102.3*  PLT 192 176 179 180   Blood Culture    Component Value Date/Time   SDES URINE, CLEAN CATCH 12/17/2014 1550   SPECREQUEST NONE 12/17/2014 1550   CULT >=100,000 COLONIES/mL GRAM NEGATIVE RODS 12/17/2014 1550   REPTSTATUS PENDING 12/17/2014 1550    Cardiac Enzymes: No results for input(s): CKTOTAL, CKMB, CKMBINDEX, TROPONINI in the last 168 hours. CBG: No results for input(s): GLUCAP in the last 168 hours. Iron Studies: No results  for input(s): IRON, TIBC, TRANSFERRIN, FERRITIN in the last 72 hours. @lablastinr3 @ Studies/Results: Dg Chest Port 1 View  12/18/2014  CLINICAL DATA:  Nonproductive cough, fever. EXAM: PORTABLE CHEST 1 VIEW COMPARISON:  December 15, 2014. FINDINGS: Stable cardiomediastinal silhouette. No pneumothorax or pleural effusion is noted. No acute pulmonary disease is noted. Stable elevation of left hemidiaphragm is noted. Bony thorax is unremarkable. IMPRESSION: No acute cardiopulmonary abnormality seen. Electronically Signed   By: Marijo Conception, M.D.   On: 12/18/2014 08:13   Medications:   . aspirin EC  81 mg Oral Daily  . cefTAZidime (FORTAZ)  IV  2 g Intravenous Once  . [START ON 12/19/2014] cefTAZidime (FORTAZ)  IV  2  g Intravenous Q T,Th,Sa-HD  . fluconazole  200 mg Oral Daily  . HYDROcodone-acetaminophen  1 tablet Oral Once  . magic mouthwash w/lidocaine  5 mL Oral QID  . pantoprazole  40 mg Oral Daily  . predniSONE  5 mg Oral Q breakfast  . sodium chloride  3 mL Intravenous Q12H  . tacrolimus  2 mg Oral BID  . vancomycin  2,000 mg Intravenous Once  . [START ON 12/19/2014] vancomycin  1,000 mg Intravenous Q T,Th,Sa-HD

## 2014-12-18 NOTE — Progress Notes (Signed)
ANTIBIOTIC CONSULT NOTE - INITIAL  Pharmacy Consult for Vancocin and Zosyn->change to [facility] Indication: rule out sepsis  No Known Allergies  Patient Measurements: Height: 5\' 1"  (154.9 cm) Weight: 186 lb 9.6 oz (84.641 kg) IBW/kg (Calculated) : 47.8  Vital Signs: Temp: 99.5 F (37.5 C) (12/02 0552) Temp Source: Oral (12/02 0552) BP: 118/64 mmHg (12/02 0747) Pulse Rate: 111 (12/02 0747)  Labs:  Recent Labs  12/15/14 1851 12/16/14 0540 12/17/14 0824 12/17/14 0825 12/18/14 0904  WBC 8.1 4.9 12.4*  --  8.7  HGB 9.9* 9.1* 9.0*  --  8.3*  PLT 192 176 179  --  180  CREATININE 3.51* 4.32*  --  5.91*  --    Estimated Creatinine Clearance: 10.1 mL/min (by C-G formula based on Cr of 5.91).  Microbiology: Recent Results (from the past 720 hour(s))  Rapid strep screen     Status: None   Collection Time: 12/15/14  6:35 PM  Result Value Ref Range Status   Streptococcus, Group A Screen (Direct) NEGATIVE NEGATIVE Final    Comment: (NOTE) A Rapid Antigen test may result negative if the antigen level in the sample is below the detection level of this test. The FDA has not cleared this test as a stand-alone test therefore the rapid antigen negative result has reflexed to a Group A Strep culture.   Culture, Group A Strep     Status: None   Collection Time: 12/15/14  6:35 PM  Result Value Ref Range Status   Strep A Culture Negative  Final    Comment: (NOTE) Performed At: [facility] [address] [place], [place] [id number] Lindon Romp MD [id number]   Culture, blood (routine x 2)     Status: None (Preliminary result)   Collection Time: 12/15/14  6:44 PM  Result Value Ref Range Status   Specimen Description BLOOD RIGHT HAND  Final   Special Requests BOTTLES DRAWN AEROBIC AND ANAEROBIC 5CC  Final   Culture NO GROWTH 2 DAYS  Final   Report Status PENDING  Incomplete    Medical History: Past Medical History  Diagnosis Date  . Hypertension   . Chest pain  Jan 2016    low risk Myoview   . GERD (gastroesophageal reflux disease)   . Complication of anesthesia 2005    difficulty remembering for a while  . Heart murmur Nov 2015    Aortic scleosis- no stenosis  . Shortness of breath dyspnea     with exertion  . Constipation   . Arthritis     knees  . Anemia   . Pericarditis 11/2014  . ESRD on dialysis [name] April 2016    MWF    Medications:  Prescriptions prior to admission  Medication Sig Dispense Refill Last Dose  . acetaminophen (TYLENOL) 500 MG tablet Take 500 mg by mouth every 6 (six) hours as needed for mild pain, moderate pain or headache.    12/15/2014 at Unknown time  . aspirin 81 MG tablet Take 81 mg by mouth daily.   Past Week at Unknown time  . Darbepoetin Alfa-Albumin (ARANESP IJ) Inject as directed every 30 (thirty) days.   Past Month at Unknown time  . ethyl chloride spray Apply 1 application topically 3 (three) times a week. Use on arms and legs on Monday Wednesday and fridays  99 12/15/2014 at Unknown time  . Fe Fum-FePoly-Vit C-Vit B3 (INTEGRA PO) Take 1 tablet by mouth daily.    Past Week at Unknown time  . pantoprazole (  PROTONIX) 40 MG tablet TAKE 1 TABLET (40 MG TOTAL) BY MOUTH DAILY. 30 tablet 9 Past Week at Unknown time  . polyethylene glycol (MIRALAX / GLYCOLAX) packet Take 17 g by mouth daily as needed for mild constipation.   Past Month at Unknown time  . predniSONE (DELTASONE) 5 MG tablet Take 1 tablet by mouth daily.  3 Past Week at Unknown time  . tacrolimus (PROGRAF) 1 MG capsule Take 2 mg by mouth 2 (two) times daily.   10 Past Week at Unknown time   Scheduled:  . aspirin EC  81 mg Oral Daily  . cefTAZidime (FORTAZ)  IV  2 g Intravenous Once  . [START ON 12/19/2014] cefTAZidime (FORTAZ)  IV  2 g Intravenous Q T,Th,Sa-HD  . fluconazole  200 mg Oral Daily  . HYDROcodone-acetaminophen  1 tablet Oral Once  . magic mouthwash w/lidocaine  5 mL Oral QID  . pantoprazole  40 mg Oral Daily  . predniSONE  5 mg Oral  Q breakfast  . sodium chloride  3 mL Intravenous Q12H  . tacrolimus  2 mg Oral BID  . vancomycin  2,000 mg Intravenous Once  . [START ON 12/19/2014] vancomycin  1,000 mg Intravenous Q T,Th,Sa-HD    Assessment: 59yo female admitted w/ vasculitis, now w/ concern for sepsis, to begin IV ABX.  Goal of Therapy:  Pre-HD level 15-25  Plan:  Vancomycin 2 grams iv x 1 dose now then Vancomycin 1 gram iv Q HD Fortaz 2 grams iv x 1 now then 2 grams iv Q HD Follow up cultures, HD schedule, fever curve  Thank you Anette Guarneri, PharmD [phone] 12/18/2014,10:00 AM

## 2014-12-18 NOTE — Progress Notes (Signed)
[facility] Progress Note  Assessment/Plan: 1.Fevers/ sore throat - in patient w recent failed transplant attempt (July) hosptialized at [facility] 11-2014 with vasculitis. Strep A negative. Infectious disease following Tmax 101. Droplet isolation. Vanc/Fortaz/diflucan.  2 ESRD HD MWF-Had HD yesterday, will have HD Saturday and again 12/05 to get back on schedule. Small amt hematuria-no  Heparin. 3 Failed renal transplant Jul '16. Continue Prednisone/tacrolimus 4 Recent vasculitis episode - had bilat LE rash assoc w severe leukopenia at [facility], the w/u didn't show any pos testing (HIV, CMV, EBV, hepatitis, BK and Cdif), WBC rebounded.  5 HTN - no meds 6 Vol HD yesterday. Net UF 0 Post wt 85.6. Is currently at OP EDW.  7. Anemia: HGB 8.3. Will start ESA-was not taking OP. Follow HGB.   Rita H. Brown NP-C 12/18/2014, 10:40 AM  [facility] [phone]  Pt seen, examined and agree w A/P as above.  Kelly Splinter MD [facility] pager [phone]    cell [phone] 12/18/2014, 1:38 PM    Subjective: "I don't feel good". Patient has nonproductive dry cough. Denies chest pain/SOB.    Objective Filed Vitals:   12/17/14 1946 12/17/14 2204 12/18/14 0552 12/18/14 0747  BP: 113/53  94/59 118/64  Pulse: 123  121 111  Temp: 101 F (38.3 C) 99.8 F (37.7 C) 99.5 F (37.5 C)   TempSrc: Oral Oral Oral   Resp: 18  21   Height:      Weight:   84.641 kg (186 lb 9.6 oz)   SpO2: 100%  100%    Physical Exam General: Chronically ill appearing female NAD Heart: S1, S2, RRR. No M/R/G.  Lungs: Bilateral breath sounds decreased in bases CTA AP Abdomen: Soft nontender Extremities: No LE Edema Dialysis Access: LUA AVG + thrill/+ Bruit. AVF does not appear swollen, is slightly discolored, pt states "It's the same as it's always been".   Dialysis Orders: MonWedFri, 4 hrs 0 min, 180NRe Optiflux, BFR 400, DFR Autoflow  1.5, EDW 85.5 (kg), Dialysate 2.0 K, 2.25 Ca, UFR Profile: None, Sodium Model: None, Access: LUA AV Graft Heparin: 4000 units per treatment Venofer: 50 mg IV weekly (12/11/14)  Additional Objective Labs: Basic Metabolic Panel:  Last Labs      Recent Labs Lab 12/16/14 0540 12/17/14 0825 12/18/14 0904  NA 140 138 139  K 3.9 4.0 4.2  CL 102 101 102  CO2 28 24 26   GLUCOSE 72 99 115*  BUN 18 30* 17  CREATININE 4.32* 5.91* 4.30*  CALCIUM 8.2* 8.3* 8.3*  PHOS --  4.6 --      Liver Function Tests:  Last Labs      Recent Labs Lab 12/15/14 1851 12/17/14 0825 12/18/14 0904  AST 19 --  13*  ALT 36 --  17  ALKPHOS 97 --  75  BILITOT 0.5 --  0.4  PROT 6.2* --  5.7*  ALBUMIN 3.2* 2.7* 2.5*      Last Labs     No results for input(s): LIPASE, AMYLASE in the last 168 hours.   CBC:  Last Labs      Recent Labs Lab 12/15/14 1851 12/16/14 0540 12/17/14 0824 12/18/14 0904  WBC 8.1 4.9 12.4* 8.7  NEUTROABS 7.1 --  --  7.6  HGB 9.9* 9.1* 9.0* 8.3*  HCT 31.2* 29.8* 29.4* 26.3*  MCV 100.3* 101.7* 101.0* 102.3*  PLT 192 176 179 180     Blood Culture  Labs (Brief)       Component Value  Date/Time   SDES URINE, CLEAN CATCH 12/17/2014 1550   SPECREQUEST NONE 12/17/2014 1550   CULT >=100,000 COLONIES/mL GRAM NEGATIVE RODS 12/17/2014 1550   REPTSTATUS PENDING 12/17/2014 1550      Cardiac Enzymes:  Last Labs     No results for input(s): CKTOTAL, CKMB, CKMBINDEX, TROPONINI in the last 168 hours.   CBG:  Last Labs     No results for input(s): GLUCAP in the last 168 hours.   Iron Studies:    Recent Labs (last 2 labs)     No results for input(s): IRON, TIBC, TRANSFERRIN, FERRITIN in the last 72 hours.   @lablastinr3 @ Studies/Results:  Imaging Results (Last 48 hours)    Dg Chest Port 1 View  12/18/2014 CLINICAL DATA: Nonproductive  cough, fever. EXAM: PORTABLE CHEST 1 VIEW COMPARISON: December 15, 2014. FINDINGS: Stable cardiomediastinal silhouette. No pneumothorax or pleural effusion is noted. No acute pulmonary disease is noted. Stable elevation of left hemidiaphragm is noted. Bony thorax is unremarkable. IMPRESSION: No acute cardiopulmonary abnormality seen. Electronically Signed By: Marijo Conception, M.D. On: 12/18/2014 08:13    Medications:   . aspirin EC 81 mg Oral Daily  . cefTAZidime (FORTAZ) IV 2 g Intravenous Once  . [START ON 12/19/2014] cefTAZidime (FORTAZ) IV 2 g Intravenous Q T,Th,Sa-HD  . fluconazole 200 mg Oral Daily  . HYDROcodone-acetaminophen 1 tablet Oral Once  . magic mouthwash w/lidocaine 5 mL Oral QID  . pantoprazole 40 mg Oral Daily  . predniSONE 5 mg Oral Q breakfast  . sodium chloride 3 mL Intravenous Q12H  . tacrolimus 2 mg Oral BID  . vancomycin 2,000 mg Intravenous Once  . [START ON 12/19/2014] vancomycin 1,000 mg Intravenous Q T,Th,Sa-HD

## 2014-12-18 NOTE — Progress Notes (Signed)
ANTIBIOTIC CONSULT NOTE - INITIAL  Pharmacy Consult for Vancocin and Zosyn Indication: rule out sepsis  No Known Allergies  Patient Measurements: Height: 5\' 1"  (154.9 cm) Weight: 186 lb 9.6 oz (84.641 kg) IBW/kg (Calculated) : 47.8  Vital Signs: Temp: 99.5 F (37.5 C) (12/02 0552) Temp Source: Oral (12/02 0552) BP: 118/64 mmHg (12/02 0747) Pulse Rate: 111 (12/02 0747)  Labs:  Recent Labs  12/15/14 1851 12/16/14 0540 12/17/14 0824 12/17/14 0825  WBC 8.1 4.9 12.4*  --   HGB 9.9* 9.1* 9.0*  --   PLT 192 176 179  --   CREATININE 3.51* 4.32*  --  5.91*   Estimated Creatinine Clearance: 10.1 mL/min (by C-G formula based on Cr of 5.91).  Microbiology: Recent Results (from the past 720 hour(s))  Rapid strep screen     Status: None   Collection Time: 12/15/14  6:35 PM  Result Value Ref Range Status   Streptococcus, Group A Screen (Direct) NEGATIVE NEGATIVE Final    Comment: (NOTE) A Rapid Antigen test may result negative if the antigen level in the sample is below the detection level of this test. The FDA has not cleared this test as a stand-alone test therefore the rapid antigen negative result has reflexed to a Group A Strep culture.   Culture, Group A Strep     Status: None   Collection Time: 12/15/14  6:35 PM  Result Value Ref Range Status   Strep A Culture Negative  Final    Comment: (NOTE) Performed At: [facility] [address] [place], [place] [id number] Lindon Romp MD [id number]   Culture, blood (routine x 2)     Status: None (Preliminary result)   Collection Time: 12/15/14  6:44 PM  Result Value Ref Range Status   Specimen Description BLOOD RIGHT HAND  Final   Special Requests BOTTLES DRAWN AEROBIC AND ANAEROBIC 5CC  Final   Culture NO GROWTH 2 DAYS  Final   Report Status PENDING  Incomplete    Medical History: Past Medical History  Diagnosis Date  . Hypertension   . Chest pain Jan 2016    low risk Myoview   . GERD  (gastroesophageal reflux disease)   . Complication of anesthesia 2005    difficulty remembering for a while  . Heart murmur Nov 2015    Aortic scleosis- no stenosis  . Shortness of breath dyspnea     with exertion  . Constipation   . Arthritis     knees  . Anemia   . Pericarditis 11/2014  . ESRD on dialysis [facility]) April 2016    MWF    Medications:  Prescriptions prior to admission  Medication Sig Dispense Refill Last Dose  . acetaminophen (TYLENOL) 500 MG tablet Take 500 mg by mouth every 6 (six) hours as needed for mild pain, moderate pain or headache.    12/15/2014 at Unknown time  . aspirin 81 MG tablet Take 81 mg by mouth daily.   Past Week at Unknown time  . Darbepoetin Alfa-Albumin (ARANESP IJ) Inject as directed every 30 (thirty) days.   Past Month at Unknown time  . ethyl chloride spray Apply 1 application topically 3 (three) times a week. Use on arms and legs on Monday Wednesday and fridays  99 12/15/2014 at Unknown time  . Fe Fum-FePoly-Vit C-Vit B3 (INTEGRA PO) Take 1 tablet by mouth daily.    Past Week at Unknown time  . pantoprazole (PROTONIX) 40 MG tablet TAKE 1 TABLET (40 MG TOTAL)  BY MOUTH DAILY. 30 tablet 9 Past Week at Unknown time  . polyethylene glycol (MIRALAX / GLYCOLAX) packet Take 17 g by mouth daily as needed for mild constipation.   Past Month at Unknown time  . predniSONE (DELTASONE) 5 MG tablet Take 1 tablet by mouth daily.  3 Past Week at Unknown time  . tacrolimus (PROGRAF) 1 MG capsule Take 2 mg by mouth 2 (two) times daily.   10 Past Week at Unknown time   Scheduled:  . aspirin EC  81 mg Oral Daily  . fluconazole  200 mg Oral Daily  . HYDROcodone-acetaminophen  1 tablet Oral Once  . magic mouthwash w/lidocaine  5 mL Oral QID  . pantoprazole  40 mg Oral Daily  . piperacillin-tazobactam  3.375 g Intravenous Once  . predniSONE  5 mg Oral Q breakfast  . sodium chloride  3 mL Intravenous Q12H  . tacrolimus  2 mg Oral BID  . vancomycin  1,000 mg  Intravenous Once    Assessment: 59yo female admitted w/ vasculitis, now w/ concern for sepsis, to begin IV ABX.  Goal of Therapy:  Pre-HD level 15-25  Plan:  Will give vancomycin 2000mg  IV x1 followed by 1000mg  IV after each HD as well as Zosyn 2.25g IV Q8H and monitor CBC, Cx, levels prn.  Karen [name], PharmD, BCPS  12/18/2014,8:15 AM

## 2014-12-19 DIAGNOSIS — A419 Sepsis, unspecified organism: Secondary | ICD-10-CM

## 2014-12-19 LAB — CBC
HCT: 25.8 % — ABNORMAL LOW (ref 36.0–46.0)
HEMOGLOBIN: 8 g/dL — AB (ref 12.0–15.0)
MCH: 32 pg (ref 26.0–34.0)
MCHC: 31 g/dL (ref 30.0–36.0)
MCV: 103.2 fL — AB (ref 78.0–100.0)
Platelets: 192 10*3/uL (ref 150–400)
RBC: 2.5 MIL/uL — AB (ref 3.87–5.11)
RDW: 14 % (ref 11.5–15.5)
WBC: 6.2 10*3/uL (ref 4.0–10.5)

## 2014-12-19 LAB — BASIC METABOLIC PANEL
ANION GAP: 8 (ref 5–15)
BUN: 30 mg/dL — AB (ref 6–20)
CHLORIDE: 102 mmol/L (ref 101–111)
CO2: 27 mmol/L (ref 22–32)
Calcium: 8.5 mg/dL — ABNORMAL LOW (ref 8.9–10.3)
Creatinine, Ser: 5.09 mg/dL — ABNORMAL HIGH (ref 0.44–1.00)
GFR calc Af Amer: 10 mL/min — ABNORMAL LOW (ref >60)
GFR calc non Af Amer: 8 mL/min — ABNORMAL LOW (ref >60)
Glucose, Bld: 120 mg/dL — ABNORMAL HIGH (ref 65–99)
POTASSIUM: 4 mmol/L (ref 3.5–5.1)
SODIUM: 137 mmol/L (ref 135–145)

## 2014-12-19 LAB — URINE CULTURE

## 2014-12-19 MED ORDER — LIDOCAINE-PRILOCAINE 2.5-2.5 % EX CREA
1.0000 "application " | TOPICAL_CREAM | CUTANEOUS | Status: DC | PRN
  Filled 2014-12-19: qty 5

## 2014-12-19 MED ORDER — SODIUM CHLORIDE 0.9 % IV SOLN: 100.0000 mL | INTRAVENOUS | Status: DC | PRN

## 2014-12-19 MED ORDER — LIDOCAINE HCL (PF) 1 % IJ SOLN: 5.0000 mL | INTRAMUSCULAR | Status: DC | PRN

## 2014-12-19 MED ORDER — PENTAFLUOROPROP-TETRAFLUOROETH EX AERO: 1.0000 "application " | INHALATION_SPRAY | CUTANEOUS | Status: DC | PRN

## 2014-12-19 MED ORDER — HEPARIN SODIUM (PORCINE) 1000 UNIT/ML DIALYSIS
1000.0000 [IU] | INTRAMUSCULAR | Status: DC | PRN
  Filled 2014-12-19: qty 1

## 2014-12-19 MED ORDER — ALTEPLASE 2 MG IJ SOLR
2.0000 mg | Freq: Once | INTRAMUSCULAR | Status: DC | PRN
  Filled 2014-12-19: qty 2

## 2014-12-19 MED ORDER — ACETAMINOPHEN 325 MG PO TABS
ORAL_TABLET | ORAL | Status: AC
  Administered 2014-12-19: 11:00:00
  Filled 2014-12-19: qty 2

## 2014-12-19 NOTE — Procedures (Signed)
Fever curve may be improving now. Tmax yesterday was down to 100.4. Still sore throat, no new symptoms.  On emp abx w vanc/ fortaz, ID following. HD today.   I was present at this dialysis session, have reviewed the session itself and made  appropriate changes Kelly Splinter MD [place] pager [phone]    cell [phone] 12/19/2014, 9:30 AM

## 2014-12-19 NOTE — Progress Notes (Signed)
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  At that time, she was tested for HIV, hepatitis, BK virus, EBV, CMV, and C. Difficile, ANA, MPO, PR3, crypto Ag, HSV all of which were negative.  Ferritin 3564, CRP 19.7, ESR 40.  Complement levels were normal.  Her bactrim was held due to transaminitis and her valcyte was held due to leukopenia.  They anticipated starting dapson.  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    She was seen at the [facility] ED on 11/28 for fever and sore throat and discharged home.  She was admitted during his hospitalization for concern for pericarditis given suggestive appearance on CXR for pericardial effusion, however, she has not had significant chest pain.    Assessment and Plan  Sore throat, possible thrush.  Sore throat is about the same -  GAS neg  -  Start fluconazole and magic mouthwash with lidocaine for symptom relief  Sepsis (tachycardia, tachypnea, fever 101F) secondary to pyelonephritis/UTI.   DDx also includes underlying vasculitis given very elevated ESR.  Temperature and WBC appear to be trending down -  Blood culture NGTD -  Urine culture growing > 100000 klebsiella -  Legionella pending -  Induced sputum -  ECHO -  [place] pending -  RSV pending -  CXR negative -  She has not been taking bactrim or valcyte for the last month  -  ID assistance appreciated -  ESR 136, complement levels wnl and cryoglobulin pending -  continue vanc and ceftaz through tomorrow -  If temperature and WBC trending down consistently and blood cultures negative, consider narrowing to  ceftriaxone  ESRD on HD -  Hd on THSa schedule  Failed renal transplant -  Continue immunosuppression -  Prophylaxis deferred to [facility] who will likely start Dapsone  Diet:  renal Access:  PIV IVF:  off Proph:  heparin  Code Status: full Family Communication: patient alone Disposition Plan:  Pending further treatment for sepsis   Consultants:  ID  Nephrology  Procedures:  HD  Antibiotics:  Fluconazole 12/1 >    vanc and fortaz 12/2  HPI/Subjective:  Still feeling very unwell with malaise, fevers, chills, worsening cough.  Urine dark but less bloody looking this morning.  Sore throat may be slightly better.  Objective: Filed Vitals:   12/19/14 1030 12/19/14 1100 12/19/14 1118 12/19/14 1302  BP: 114/71 113/69 111/69 126/66  Pulse: 101 104 104 104  Temp:   98.4 F (36.9 C) 97.9 F (36.6 C)  TempSrc:   Oral Oral  Resp:   19 20  Height:      Weight:   85.4 kg (188 lb 4.4 oz)   SpO2:   100% 96%    Intake/Output Summary (Last 24 hours) at 12/19/14 1519 Last data filed at 12/19/14 1411  Gross per 24 hour  Intake    720 ml  Output   1351 ml  Net   -631 ml   Filed Weights   12/19/14 0446 12/19/14 0712 12/19/14 1118  Weight: 86.5 kg (190 lb 11.2 oz) 86.3 kg (190 lb 4.1 oz) 85.4 kg (188 lb 4.4 oz)   Body  mass index is 35.59 kg/(m^2).  Exam:   General:  Adult female, ill-appearing  HEENT:  NCAT, MMM, no further evidence of thrush   Cardiovascular:  RRR, nl S1, S2 no mrg, 2+ pulses, warm extremities  Respiratory:  CTAB, frequent wheezy cough, no increased WOB  Abdomen:   NABS, soft, NT/ND  MSK:   Normal tone and bulk, no LEE  Neuro:  Grossly intact  Data Reviewed: Basic Metabolic Panel:  Recent Labs Lab 12/15/14 1851 12/16/14 0540 12/17/14 0825 12/18/14 0904 12/19/14 0322  NA 139 140 138 139 137  K 3.8 3.9 4.0 4.2 4.0  CL 98* 102 101 102 102  CO2 $Re'27 28 24 26 27  'BTz$ GLUCOSE 108* 72 99 115* 120*  BUN 15 18 30* 17 30*  CREATININE  3.51* 4.32* 5.91* 4.30* 5.09*  CALCIUM 8.5* 8.2* 8.3* 8.3* 8.5*  PHOS  --   --  4.6  --   --    Liver Function Tests:  Recent Labs Lab 12/15/14 1851 12/17/14 0825 12/18/14 0904  AST 19  --  13*  ALT 36  --  17  ALKPHOS 97  --  75  BILITOT 0.5  --  0.4  PROT 6.2*  --  5.7*  ALBUMIN 3.2* 2.7* 2.5*   No results for input(s): LIPASE, AMYLASE in the last 168 hours. No results for input(s): AMMONIA in the last 168 hours. CBC:  Recent Labs Lab 12/15/14 1851 12/16/14 0540 12/17/14 0824 12/18/14 0904 12/19/14 0322  WBC 8.1 4.9 12.4* 8.7 6.2  NEUTROABS 7.1  --   --  7.6  --   HGB 9.9* 9.1* 9.0* 8.3* 8.0*  HCT 31.2* 29.8* 29.4* 26.3* 25.8*  MCV 100.3* 101.7* 101.0* 102.3* 103.2*  PLT 192 176 179 180 192    Recent Results (from the past 240 hour(s))  Rapid strep screen     Status: None   Collection Time: 12/15/14  6:35 PM  Result Value Ref Range Status   Streptococcus, Group A Screen (Direct) NEGATIVE NEGATIVE Final    Comment: (NOTE) A Rapid Antigen test may result negative if the antigen level in the sample is below the detection level of this test. The FDA has not cleared this test as a stand-alone test therefore the rapid antigen negative result has reflexed to a Group A Strep culture.   Culture, Group A Strep     Status: None   Collection Time: 12/15/14  6:35 PM  Result Value Ref Range Status   Strep A Culture Negative  Final    Comment: (NOTE) Performed At: [facility] [place], [place] [id number] Lindon Romp MD [id number]   Culture, blood (routine x 2)     Status: None (Preliminary result)   Collection Time: 12/15/14  6:44 PM  Result Value Ref Range Status   Specimen Description BLOOD RIGHT HAND  Final   Special Requests BOTTLES DRAWN AEROBIC AND ANAEROBIC 5CC  Final   Culture NO GROWTH 4 DAYS  Final   Report Status PENDING  Incomplete  Culture, Urine     Status: None   Collection Time: 12/17/14  3:50 PM  Result Value Ref  Range Status   Specimen Description URINE, CLEAN CATCH  Final   Special Requests NONE  Final   Culture >=100,000 COLONIES/mL KLEBSIELLA PNEUMONIAE  Final   Report Status 12/19/2014 FINAL  Final   Organism ID, Bacteria KLEBSIELLA PNEUMONIAE  Final      Susceptibility   Klebsiella pneumoniae - MIC*  AMPICILLIN >=32 RESISTANT Resistant     CEFAZOLIN <=4 SENSITIVE Sensitive     CEFTRIAXONE <=1 SENSITIVE Sensitive     CIPROFLOXACIN <=0.25 SENSITIVE Sensitive     GENTAMICIN <=1 SENSITIVE Sensitive     IMIPENEM <=0.25 SENSITIVE Sensitive     NITROFURANTOIN 64 INTERMEDIATE Intermediate     TRIMETH/SULFA >=320 RESISTANT Resistant     AMPICILLIN/SULBACTAM 8 SENSITIVE Sensitive     PIP/TAZO <=4 SENSITIVE Sensitive     * >=100,000 COLONIES/mL KLEBSIELLA PNEUMONIAE  Culture, blood (x 2)     Status: None (Preliminary result)   Collection Time: 12/18/14  9:04 AM  Result Value Ref Range Status   Specimen Description BLOOD RIGHT HAND  Final   Special Requests BOTTLES DRAWN AEROBIC ONLY 3CC  Final   Culture NO GROWTH 1 DAY  Final   Report Status PENDING  Incomplete  Culture, blood (x 2)     Status: None (Preliminary result)   Collection Time: 12/18/14  9:13 AM  Result Value Ref Range Status   Specimen Description BLOOD RIGHT WRIST  Final   Special Requests BOTTLES DRAWN AEROBIC ONLY 3CC  Final   Culture NO GROWTH 1 DAY  Final   Report Status PENDING  Incomplete     Studies: Dg Chest Port 1 View  12/18/2014  CLINICAL DATA:  Nonproductive cough, fever. EXAM: PORTABLE CHEST 1 VIEW COMPARISON:  December 15, 2014. FINDINGS: Stable cardiomediastinal silhouette. No pneumothorax or pleural effusion is noted. No acute pulmonary disease is noted. Stable elevation of left hemidiaphragm is noted. Bony thorax is unremarkable. IMPRESSION: No acute cardiopulmonary abnormality seen. Electronically Signed   By: Marijo Conception, M.D.   On: 12/18/2014 08:13    Scheduled Meds: . aspirin EC  81 mg Oral Daily  .  cefTAZidime (FORTAZ)  IV  2 g Intravenous Q T,Th,Sa-HD  . fluconazole  200 mg Oral Daily  . HYDROcodone-acetaminophen  1 tablet Oral Once  . magic mouthwash w/lidocaine  5 mL Oral QID  . pantoprazole  40 mg Oral Daily  . predniSONE  5 mg Oral Q breakfast  . sodium chloride  3 mL Intravenous Q12H  . sodium chloride HYPERTONIC  3 mL Nebulization Daily  . tacrolimus  2 mg Oral BID  . vancomycin  1,000 mg Intravenous Q T,Th,Sa-HD   Continuous Infusions:   Principal Problem:   Fever, unspecified Active Problems:   End stage renal disease on dialysis (HCC)   Thrush   Erythema induratum   Febrile illness   Fever    Time spent: 30 min    Pinchus Weckwerth, [facility] Pager [phone]. If 7PM-7AM, please contact night-coverage at [email or web address], password [facility] 12/19/2014, 3:19 PM  LOS: 1 day

## 2014-12-20 ENCOUNTER — Encounter (HOSPITAL_COMMUNITY): Payer: Self-pay | Admitting: Internal Medicine

## 2014-12-20 ENCOUNTER — Inpatient Hospital Stay (HOSPITAL_COMMUNITY): Payer: 59

## 2014-12-20 DIAGNOSIS — B961 Klebsiella pneumoniae [K. pneumoniae] as the cause of diseases classified elsewhere: Secondary | ICD-10-CM

## 2014-12-20 DIAGNOSIS — N390 Urinary tract infection, site not specified: Secondary | ICD-10-CM

## 2014-12-20 DIAGNOSIS — R0600 Dyspnea, unspecified: Secondary | ICD-10-CM

## 2014-12-20 LAB — CBC
HEMATOCRIT: 25.9 % — AB (ref 36.0–46.0)
Hemoglobin: 7.9 g/dL — ABNORMAL LOW (ref 12.0–15.0)
MCH: 31 pg (ref 26.0–34.0)
MCHC: 30.5 g/dL (ref 30.0–36.0)
MCV: 101.6 fL — ABNORMAL HIGH (ref 78.0–100.0)
PLATELETS: 210 10*3/uL (ref 150–400)
RBC: 2.55 MIL/uL — ABNORMAL LOW (ref 3.87–5.11)
RDW: 13.6 % (ref 11.5–15.5)
WBC: 4.2 10*3/uL (ref 4.0–10.5)

## 2014-12-20 LAB — CULTURE, BLOOD (ROUTINE X 2): Culture: NO GROWTH

## 2014-12-20 LAB — EXPECTORATED SPUTUM ASSESSMENT W GRAM STAIN, RFLX TO RESP C

## 2014-12-20 LAB — RESPIRATORY VIRUS PANEL
Adenovirus: NEGATIVE
INFLUENZA A: NEGATIVE
INFLUENZA B 1: NEGATIVE
METAPNEUMOVIRUS: NEGATIVE
PARAINFLUENZA 1 A: NEGATIVE
PARAINFLUENZA 3 A: NEGATIVE
Parainfluenza 2: NEGATIVE
Respiratory Syncytial Virus A: NEGATIVE
Respiratory Syncytial Virus B: NEGATIVE
Rhinovirus: NEGATIVE

## 2014-12-20 LAB — PNEUMOCYSTIS JIROVECI SMEAR BY DFA: PNEUMOCYSTIS JIROVECI AG: NEGATIVE

## 2014-12-20 LAB — BASIC METABOLIC PANEL
ANION GAP: 8 (ref 5–15)
BUN: 16 mg/dL (ref 6–20)
CO2: 29 mmol/L (ref 22–32)
Calcium: 8.5 mg/dL — ABNORMAL LOW (ref 8.9–10.3)
Chloride: 99 mmol/L — ABNORMAL LOW (ref 101–111)
Creatinine, Ser: 3.51 mg/dL — ABNORMAL HIGH (ref 0.44–1.00)
GFR, EST AFRICAN AMERICAN: 15 mL/min — AB (ref >60)
GFR, EST NON AFRICAN AMERICAN: 13 mL/min — AB (ref >60)
GLUCOSE: 130 mg/dL — AB (ref 65–99)
Potassium: 4.2 mmol/L (ref 3.5–5.1)
Sodium: 136 mmol/L (ref 135–145)

## 2014-12-20 LAB — D-DIMER, QUANTITATIVE: D-Dimer, Quant: 1.63 ug{FEU}/mL — ABNORMAL HIGH (ref 0.00–0.50)

## 2014-12-20 LAB — TSH: TSH: 0.276 u[IU]/mL — ABNORMAL LOW (ref 0.350–4.500)

## 2014-12-20 LAB — EXPECTORATED SPUTUM ASSESSMENT W REFEX TO RESP CULTURE

## 2014-12-20 MED ORDER — DEXTROSE 5 % IV SOLN
2.0000 g | INTRAVENOUS | Status: DC
  Administered 2014-12-21 (×2): 2 g via INTRAVENOUS
  Filled 2014-12-20 (×2): qty 2

## 2014-12-20 NOTE — Progress Notes (Addendum)
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  At that time, she was tested for HIV, hepatitis, BK virus, EBV, CMV, and C. Difficile, ANA, MPO, PR3, crypto Ag, HSV all of which were negative.  Ferritin 3564, CRP 19.7, ESR 40.  Complement levels were normal.  Her bactrim was held due to transaminitis and her valcyte was held due to leukopenia.  They anticipated starting dapson.  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    She was seen at the [facility] ED on 11/28 for fever and sore throat and discharged home.  She was admitted during his hospitalization for concern for pericarditis given suggestive appearance on CXR for pericardial effusion, however, she has not had significant chest pain.    Assessment and Plan  Sore throat, possible thrush.  Sore throat is finally improving -  GAS neg  -  Continue fluconazole and magic mouthwash with lidocaine for symptom relief  Sepsis (tachycardia, tachypnea, fever 101F) secondary to pyelonephritis/UTI, Klebsiella, present prior to admission.  NOT catheter associated.  DDx also includes underlying vasculitis given very elevated ESR.  Temperature and WBC appear to be trending down -  Blood culture NGTD -  Urine culture growing > 100000 klebsiella -  Legionella pending -  Induced sputum -  ECHO -  [place] pending -  RSV neg -  PCP sputum stain negative -  CXR negative -  She has not been taking bactrim or valcyte for the last month  -  ID assistance appreciated -  ESR 136, complement levels wnl and cryoglobulin pending -  D/c vancomycin -  Continue ceftaz  -  If  temperature and WBC trending down consistently and blood cultures negative, consider narrowing to ceftriaxone  Persistent sinus tachycardia -  Not improving with antibiotics -  Repeat TSH, D-dimer -  ECHO  ESRD on HD -  Hd on THSa schedule  Failed renal transplant -  Continue immunosuppression -  Prophylaxis deferred to [facility] who will likely start Dapsone  Diet:  renal Access:  PIV IVF:  off Proph:  Heparin  Code Status: full Family Communication: patient alone Disposition Plan:  Pending further treatment for sepsis   Consultants:  ID  Nephrology  Procedures:  HD  Antibiotics:  Fluconazole 12/1 >    vanc and fortaz 12/2  HPI/Subjective:  Feeling better.  Sore throat is improving.  Less itching in the lower abdomen.    Objective: Filed Vitals:   12/19/14 2037 12/20/14 0409 12/20/14 0909 12/20/14 1300  BP: 120/70 118/73  133/73  Pulse: 99 113  97  Temp: 98.2 F (36.8 C) 98.3 F (36.8 C)  98.6 F (37 C)  TempSrc: Oral Oral  Oral  Resp: $Remo'18 18  20  'FTmeX$ Height:      Weight:  84.641 kg (186 lb 9.6 oz)    SpO2: 98% 99% 96% 97%    Intake/Output Summary (Last 24 hours) at 12/20/14 1636 Last data filed at 12/20/14 0907  Gross per 24 hour  Intake    480 ml  Output      0 ml  Net    480 ml   [employer]  12/19/14 9758 12/19/14 1118 12/20/14 0409  Weight: 86.3 kg (190 lb 4.1 oz) 85.4 kg (188 lb 4.4 oz) 84.641 kg (186 lb 9.6 oz)   Body mass index is 35.28 kg/(m^2).  Exam:   General:  Adult female, smiling and pleasant  HEENT:  NCAT, MMM, no further evidence of thrush   Cardiovascular:  RRR, nl S1, S2 no mrg, 2+ pulses, warm extremities  Respiratory:  CTAB, frequent wheezy cough, no increased WOB  Abdomen:   NABS, soft, NT/ND  MSK:   Normal tone and bulk, no LEE  Neuro:  Bells palsy  Data Reviewed: Basic Metabolic Panel:  Recent Labs Lab 12/16/14 0540 12/17/14 0825 12/18/14 0904 12/19/14 0322 12/20/14 0316  NA 140 138 139 137  136  K 3.9 4.0 4.2 4.0 4.2  CL 102 101 102 102 99*  CO2 $Re'28 24 26 27 29  'JMj$ GLUCOSE 72 99 115* 120* 130*  BUN 18 30* 17 30* 16  CREATININE 4.32* 5.91* 4.30* 5.09* 3.51*  CALCIUM 8.2* 8.3* 8.3* 8.5* 8.5*  PHOS  --  4.6  --   --   --    Liver Function Tests:  Recent Labs Lab 12/15/14 1851 12/17/14 0825 12/18/14 0904  AST 19  --  13*  ALT 36  --  17  ALKPHOS 97  --  75  BILITOT 0.5  --  0.4  PROT 6.2*  --  5.7*  ALBUMIN 3.2* 2.7* 2.5*   No results for input(s): LIPASE, AMYLASE in the last 168 hours. No results for input(s): AMMONIA in the last 168 hours. CBC:  Recent Labs Lab 12/15/14 1851 12/16/14 0540 12/17/14 0824 12/18/14 0904 12/19/14 0322 12/20/14 0316  WBC 8.1 4.9 12.4* 8.7 6.2 4.2  NEUTROABS 7.1  --   --  7.6  --   --   HGB 9.9* 9.1* 9.0* 8.3* 8.0* 7.9*  HCT 31.2* 29.8* 29.4* 26.3* 25.8* 25.9*  MCV 100.3* 101.7* 101.0* 102.3* 103.2* 101.6*  PLT 192 176 179 180 192 210    Recent Results (from the past 240 hour(s))  Rapid strep screen     Status: None   Collection Time: 12/15/14  6:35 PM  Result Value Ref Range Status   Streptococcus, Group A Screen (Direct) NEGATIVE NEGATIVE Final    Comment: (NOTE) A Rapid Antigen test may result negative if the antigen level in the sample is below the detection level of this test. The FDA has not cleared this test as a stand-alone test therefore the rapid antigen negative result has reflexed to a Group A Strep culture.   Culture, Group A Strep     Status: None   Collection Time: 12/15/14  6:35 PM  Result Value Ref Range Status   Strep A Culture Negative  Final    Comment: (NOTE) Performed At: [facility] [address] [place], [place] [id number] Lindon Romp MD [id number]   Culture, blood (routine x 2)     Status: None   Collection Time: 12/15/14  6:44 PM  Result Value Ref Range Status   Specimen Description BLOOD RIGHT HAND  Final   Special Requests BOTTLES DRAWN AEROBIC AND ANAEROBIC 5CC  Final    Culture NO GROWTH 5 DAYS  Final   Report Status 12/20/2014 FINAL  Final  Culture, Urine     Status: None   Collection Time: 12/17/14  3:50 PM  Result Value Ref Range Status   Specimen Description URINE, CLEAN CATCH  Final   Special Requests NONE  Final  Culture >=100,000 COLONIES/mL KLEBSIELLA PNEUMONIAE  Final   Report Status 12/19/2014 FINAL  Final   Organism ID, Bacteria KLEBSIELLA PNEUMONIAE  Final      Susceptibility   Klebsiella pneumoniae - MIC*    AMPICILLIN >=32 RESISTANT Resistant     CEFAZOLIN <=4 SENSITIVE Sensitive     CEFTRIAXONE <=1 SENSITIVE Sensitive     CIPROFLOXACIN <=0.25 SENSITIVE Sensitive     GENTAMICIN <=1 SENSITIVE Sensitive     IMIPENEM <=0.25 SENSITIVE Sensitive     NITROFURANTOIN 64 INTERMEDIATE Intermediate     TRIMETH/SULFA >=320 RESISTANT Resistant     AMPICILLIN/SULBACTAM 8 SENSITIVE Sensitive     PIP/TAZO <=4 SENSITIVE Sensitive     * >=100,000 COLONIES/mL KLEBSIELLA PNEUMONIAE  Respiratory virus panel     Status: None   Collection Time: 12/18/14  5:14 AM  Result Value Ref Range Status   Source - RVPAN NASAL SWAB  Corrected   Respiratory Syncytial Virus A Negative Negative Final   Respiratory Syncytial Virus B Negative Negative Final   Influenza A Negative Negative Final   Influenza B Negative Negative Final   Parainfluenza 1 Negative Negative Final   Parainfluenza 2 Negative Negative Final   Parainfluenza 3 Negative Negative Final   Metapneumovirus Negative Negative Final   Rhinovirus Negative Negative Final   Adenovirus Negative Negative Final    Comment: (NOTE) Performed At: [facility] [address] [place], [place] [id number] Lindon Romp MD [id number]   Culture, blood (x 2)     Status: None (Preliminary result)   Collection Time: 12/18/14  9:04 AM  Result Value Ref Range Status   Specimen Description BLOOD RIGHT HAND  Final   Special Requests BOTTLES DRAWN AEROBIC ONLY 3CC  Final   Culture NO GROWTH 2 DAYS   Final   Report Status PENDING  Incomplete  Culture, blood (x 2)     Status: None (Preliminary result)   Collection Time: 12/18/14  9:13 AM  Result Value Ref Range Status   Specimen Description BLOOD RIGHT WRIST  Final   Special Requests BOTTLES DRAWN AEROBIC ONLY 3CC  Final   Culture NO GROWTH 2 DAYS  Final   Report Status PENDING  Incomplete  Culture, expectorated sputum-assessment     Status: None   Collection Time: 12/20/14  9:24 AM  Result Value Ref Range Status   Specimen Description SPU  Final   Special Requests Immunocompromised  Final   Sputum evaluation   Final    THIS SPECIMEN IS ACCEPTABLE. RESPIRATORY CULTURE REPORT TO FOLLOW.   Report Status 12/20/2014 FINAL  Final  Pneumocystis smear by DFA     Status: None   Collection Time: 12/20/14  9:24 AM  Result Value Ref Range Status   Specimen Source-PJSRC NONE  Final   Pneumocystis jiroveci Ag NEGATIVE  Final    Comment: PERFORMED AT [facility]     Studies: No results found.  Scheduled Meds: . aspirin EC  81 mg Oral Daily  . cefTAZidime (FORTAZ)  IV  2 g Intravenous Q T,Th,Sa-HD  . fluconazole  200 mg Oral Daily  . magic mouthwash w/lidocaine  5 mL Oral QID  . pantoprazole  40 mg Oral Daily  . predniSONE  5 mg Oral Q breakfast  . sodium chloride  3 mL Intravenous Q12H  . tacrolimus  2 mg Oral BID   Continuous Infusions:   Principal Problem:   Fever, unspecified Active Problems:   End stage renal disease on dialysis ([place])  Thrush   Erythema induratum   Febrile illness   Fever   Sepsis ([place])    Time spent: 30 min    Marielle Mantione, [place] [facility] Pager [phone]. If 7PM-7AM, please contact night-coverage at [email or web address], password [facility] 12/20/2014, 4:36 PM  LOS: 2 days

## 2014-12-20 NOTE — Progress Notes (Signed)
D-dimer positive.  Will send for VQ scan instead of CT since, per nephrology, we are still trying to avoid nephrotoxins to preserve transplant kidney as much as possible.

## 2014-12-20 NOTE — Progress Notes (Addendum)
INFECTIOUS DISEASE PROGRESS NOTE  ID: Karen [name] is a 59 y.o. female with  Principal Problem:   Fever, unspecified Active Problems:   End stage renal disease on dialysis (HCC)   Thrush   Erythema induratum   Febrile illness   Fever   Sepsis ([place])  Subjective: Without complaints.  Had headaches yesterday  Abtx:  Anti-infectives    Start     Dose/Rate Route Frequency Ordered Stop   12/19/14 1200  vancomycin (VANCOCIN) IVPB 1000 mg/200 mL premix     1,000 mg 200 mL/hr over 60 Minutes Intravenous Every T-Th-Sa (Hemodialysis) 12/18/14 0824     12/19/14 1200  cefTAZidime (FORTAZ) 2 g in dextrose 5 % 50 mL IVPB     2 g 100 mL/hr over 30 Minutes Intravenous Every T-Th-Sa (Hemodialysis) 12/18/14 0925     12/18/14 1600  piperacillin-tazobactam (ZOSYN) IVPB 2.25 g  Status:  Discontinued     2.25 g 100 mL/hr over 30 Minutes Intravenous Every 8 hours 12/18/14 0824 12/18/14 0909   12/18/14 0930  cefTAZidime (FORTAZ) 2 g in dextrose 5 % 50 mL IVPB     2 g 100 mL/hr over 30 Minutes Intravenous  Once 12/18/14 0924 12/18/14 1041   12/18/14 0830  vancomycin (VANCOCIN) 2,000 mg in sodium chloride 0.9 % 500 mL IVPB     2,000 mg 250 mL/hr over 120 Minutes Intravenous  Once 12/18/14 0824 12/18/14 1207   12/18/14 0830  piperacillin-tazobactam (ZOSYN) IVPB 2.25 g  Status:  Discontinued     2.25 g 100 mL/hr over 30 Minutes Intravenous  Once 12/18/14 0824 12/18/14 0909   12/18/14 0800  cefTRIAXone (ROCEPHIN) 1 g in dextrose 5 % 50 mL IVPB  Status:  Discontinued     1 g 100 mL/hr over 30 Minutes Intravenous Every 24 hours 12/18/14 0713 12/18/14 0735   12/18/14 0800  vancomycin (VANCOCIN) IVPB 1000 mg/200 mL premix  Status:  Discontinued     1,000 mg 200 mL/hr over 60 Minutes Intravenous  Once 12/18/14 0735 12/18/14 0917   12/18/14 0745  piperacillin-tazobactam (ZOSYN) IVPB 3.375 g  Status:  Discontinued     3.375 g 100 mL/hr over 30 Minutes Intravenous  Once 12/18/14 0735 12/18/14 0847   12/17/14 1800  fluconazole (DIFLUCAN) tablet 200 mg     200 mg Oral Daily 12/17/14 1704        Medications:  Scheduled: . aspirin EC  81 mg Oral Daily  . cefTAZidime (FORTAZ)  IV  2 g Intravenous Q T,Th,Sa-HD  . fluconazole  200 mg Oral Daily  . magic mouthwash w/lidocaine  5 mL Oral QID  . pantoprazole  40 mg Oral Daily  . predniSONE  5 mg Oral Q breakfast  . sodium chloride  3 mL Intravenous Q12H  . tacrolimus  2 mg Oral BID  . vancomycin  1,000 mg Intravenous Q T,Th,Sa-HD    Objective: Vital signs in last 24 hours: Temp:  [97.9 F (36.6 C)-98.3 F (36.8 C)] 98.3 F (36.8 C) (12/04 0409) Pulse Rate:  [99-113] 113 (12/04 0409) Resp:  [18-20] 18 (12/04 0409) BP: (118-126)/(66-73) 118/73 mmHg (12/04 0409) SpO2:  [96 %-99 %] 96 % (12/04 0909) Weight:  [84.641 kg (186 lb 9.6 oz)] 84.641 kg (186 lb 9.6 oz) (12/04 0409)   General appearance: alert, cooperative and no distress Resp: diminished breath sounds anterior - bilateral Cardio: regular rate and rhythm GI: normal findings: bowel sounds normal and soft, non-tender  Lab Results  Recent Labs  12/19/14 0322  12/20/14 0316  WBC 6.2 4.2  HGB 8.0* 7.9*  HCT 25.8* 25.9*  NA 137 136  K 4.0 4.2  CL 102 99*  CO2 27 29  BUN 30* 16  CREATININE 5.09* 3.51*   Liver Panel  Recent Labs  12/18/14 0904  PROT 5.7*  ALBUMIN 2.5*  AST 13*  ALT 17  ALKPHOS 75  BILITOT 0.4   Sedimentation Rate  Recent Labs  12/17/14 1813  ESRSEDRATE 136*   C-Reactive Protein  Recent Labs  12/17/14 1813  CRP 32.1*    Microbiology: Recent Results (from the past 240 hour(s))  Rapid strep screen     Status: None   Collection Time: 12/15/14  6:35 PM  Result Value Ref Range Status   Streptococcus, Group A Screen (Direct) NEGATIVE NEGATIVE Final    Comment: (NOTE) A Rapid Antigen test may result negative if the antigen level in the sample is below the detection level of this test. The FDA has not cleared this test as a  stand-alone test therefore the rapid antigen negative result has reflexed to a Group A Strep culture.   Culture, Group A Strep     Status: None   Collection Time: 12/15/14  6:35 PM  Result Value Ref Range Status   Strep A Culture Negative  Final    Comment: (NOTE) Performed At: [facility] [place], [place] [id number] Lindon Romp MD [id number]   Culture, blood (routine x 2)     Status: None (Preliminary result)   Collection Time: 12/15/14  6:44 PM  Result Value Ref Range Status   Specimen Description BLOOD RIGHT HAND  Final   Special Requests BOTTLES DRAWN AEROBIC AND ANAEROBIC 5CC  Final   Culture NO GROWTH 4 DAYS  Final   Report Status PENDING  Incomplete  Culture, Urine     Status: None   Collection Time: 12/17/14  3:50 PM  Result Value Ref Range Status   Specimen Description URINE, CLEAN CATCH  Final   Special Requests NONE  Final   Culture >=100,000 COLONIES/mL KLEBSIELLA PNEUMONIAE  Final   Report Status 12/19/2014 FINAL  Final   Organism ID, Bacteria KLEBSIELLA PNEUMONIAE  Final      Susceptibility   Klebsiella pneumoniae - MIC*    AMPICILLIN >=32 RESISTANT Resistant     CEFAZOLIN <=4 SENSITIVE Sensitive     CEFTRIAXONE <=1 SENSITIVE Sensitive     CIPROFLOXACIN <=0.25 SENSITIVE Sensitive     GENTAMICIN <=1 SENSITIVE Sensitive     IMIPENEM <=0.25 SENSITIVE Sensitive     NITROFURANTOIN 64 INTERMEDIATE Intermediate     TRIMETH/SULFA >=320 RESISTANT Resistant     AMPICILLIN/SULBACTAM 8 SENSITIVE Sensitive     PIP/TAZO <=4 SENSITIVE Sensitive     * >=100,000 COLONIES/mL KLEBSIELLA PNEUMONIAE  Respiratory virus panel     Status: None   Collection Time: 12/18/14  5:14 AM  Result Value Ref Range Status   Source - RVPAN NASAL SWAB  Corrected   Respiratory Syncytial Virus A Negative Negative Final   Respiratory Syncytial Virus B Negative Negative Final   Influenza A Negative Negative Final   Influenza B Negative Negative Final    Parainfluenza 1 Negative Negative Final   Parainfluenza 2 Negative Negative Final   Parainfluenza 3 Negative Negative Final   Metapneumovirus Negative Negative Final   Rhinovirus Negative Negative Final   Adenovirus Negative Negative Final    Comment: (NOTE) Performed At: [facility] [address] [place], [place] [id number] Lindon Romp  MD [id number]   Culture, blood (x 2)     Status: None (Preliminary result)   Collection Time: 12/18/14  9:04 AM  Result Value Ref Range Status   Specimen Description BLOOD RIGHT HAND  Final   Special Requests BOTTLES DRAWN AEROBIC ONLY 3CC  Final   Culture NO GROWTH 1 DAY  Final   Report Status PENDING  Incomplete  Culture, blood (x 2)     Status: None (Preliminary result)   Collection Time: 12/18/14  9:13 AM  Result Value Ref Range Status   Specimen Description BLOOD RIGHT WRIST  Final   Special Requests BOTTLES DRAWN AEROBIC ONLY 3CC  Final   Culture NO GROWTH 1 DAY  Final   Report Status PENDING  Incomplete  Culture, expectorated sputum-assessment     Status: None   Collection Time: 12/20/14  9:24 AM  Result Value Ref Range Status   Specimen Description SPU  Final   Special Requests Immunocompromised  Final   Sputum evaluation   Final    THIS SPECIMEN IS ACCEPTABLE. RESPIRATORY CULTURE REPORT TO FOLLOW.   Report Status 12/20/2014 FINAL  Final    Studies/Results: No results found.   Assessment/Plan: UTI Will continue anbx-  ceftaz for 7 days, she can get with HD Stop vanco  Cough Respiratory virus panel (-) CXR (-) making PCP unlikely (not impossible though.Marland Kitchen)  Vasculitis Can f/u with her PCP  ESRD Cont to f/u with HD and renal.   Total days of antibiotics: 2 vanco/ceftaz         Bobby Rumpf Infectious Diseases (pager) [phone] [email or web address] 12/20/2014, 12:29 PM  LOS: 2 days

## 2014-12-20 NOTE — Progress Notes (Signed)
  Echocardiogram 2D Echocardiogram has been performed.  Karen [name] 12/20/2014, 6:05 PM

## 2014-12-20 NOTE — Progress Notes (Signed)
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 Will start ESA-was not taking OP. Follow HGB.  5 HTN - no meds 6 Vol at dry wt 7 Recent vasculitis episode - had bilat LE rash assoc w severe leukopenia at [facility], the w/u didn't show any pos testing (HIV, CMV, EBV, hepatitis, BK and Cdif), WBC rebounded.   Plan - HD Monday  Kelly Splinter MD [facility] pager [phone]    cell [phone] 12/20/2014, 12:55 PM    Subjective: "why is my heart rate going up?"    Objective Filed Vitals:   12/17/14 1946 12/17/14 2204 12/18/14 0552 12/18/14 0747  BP: 113/53  94/59 118/64  Pulse: 123  121 111  Temp: 101 F (38.3 C) 99.8 F (37.7 C) 99.5 F (37.5 C)   TempSrc: Oral Oral Oral   Resp: 18  21   Height:      Weight:   84.641 kg (186 lb 9.6 oz)   SpO2: 100%  100%    Physical Exam General: Chronically ill appearing female NAD Heart: S1, S2, RRR. No M/R/G.  Lungs: Bilateral breath sounds decreased in bases CTA AP Abdomen: Soft nontender Extremities: No LE Edema Dialysis Access: LUA AVG + thrill/+ Bruit. AVF does not appear swollen, is slightly discolored, pt states "It's the same as it's always been".   Dialysis Orders: MonWedFri, 4 hrs 0 min, 180NRe Optiflux, BFR 400, DFR Autoflow 1.5, EDW 85.5 (kg), Dialysate 2.0 K, 2.25 Ca, UFR Profile: None, Sodium Model: None, Access: LUA AV Graft Heparin: 4000 units per treatment Venofer: 50 mg IV weekly (12/11/14)  Additional Objective Labs: Basic Metabolic Panel:  Last Labs      Recent Labs Lab 12/16/14 0540 12/17/14 0825 12/18/14 0904  NA 140 138 139  K 3.9 4.0 4.2  CL 102 101 102  CO2 28 24 26   GLUCOSE 72 99 115*  BUN 18 30* 17  CREATININE 4.32* 5.91* 4.30*  CALCIUM 8.2* 8.3* 8.3*  PHOS --   4.6 --      Liver Function Tests:  Last Labs      Recent Labs Lab 12/15/14 1851 12/17/14 0825 12/18/14 0904  AST 19 --  13*  ALT 36 --  17  ALKPHOS 97 --  75  BILITOT 0.5 --  0.4  PROT 6.2* --  5.7*  ALBUMIN 3.2* 2.7* 2.5*      Last Labs     No results for input(s): LIPASE, AMYLASE in the last 168 hours.   CBC:  Last Labs      Recent Labs Lab 12/15/14 1851 12/16/14 0540 12/17/14 0824 12/18/14 0904  WBC 8.1 4.9 12.4* 8.7  NEUTROABS 7.1 --  --  7.6  HGB 9.9* 9.1* 9.0* 8.3*  HCT 31.2* 29.8* 29.4* 26.3*  MCV 100.3* 101.7* 101.0* 102.3*  PLT 192 176 179 180     Blood Culture  Labs (Brief)       Component Value Date/Time   SDES URINE, CLEAN CATCH 12/17/2014 1550   SPECREQUEST NONE 12/17/2014 1550   CULT >=100,000 COLONIES/mL GRAM NEGATIVE RODS 12/17/2014 1550   REPTSTATUS PENDING 12/17/2014 1550      Cardiac Enzymes:  Last Labs     No results for input(s): CKTOTAL, CKMB, CKMBINDEX, TROPONINI in the last 168 hours.   CBG:  Last Labs     No results  for input(s): GLUCAP in the last 168 hours.   Iron Studies:    Recent Labs (last 2 labs)     No results for input(s): IRON, TIBC, TRANSFERRIN, FERRITIN in the last 72 hours.   @lablastinr3 @ Studies/Results:  Imaging Results (Last 48 hours)    Dg Chest Port 1 View  12/18/2014 CLINICAL DATA: Nonproductive cough, fever. EXAM: PORTABLE CHEST 1 VIEW COMPARISON: December 15, 2014. FINDINGS: Stable cardiomediastinal silhouette. No pneumothorax or pleural effusion is noted. No acute pulmonary disease is noted. Stable elevation of left hemidiaphragm is noted. Bony thorax is unremarkable. IMPRESSION: No acute cardiopulmonary abnormality seen. Electronically Signed By: Marijo Conception, M.D. On: 12/18/2014 08:13    Medications:   . aspirin EC 81 mg Oral Daily  . cefTAZidime (FORTAZ) IV 2 g Intravenous Once  .  [START ON 12/19/2014] cefTAZidime (FORTAZ) IV 2 g Intravenous Q T,Th,Sa-HD  . fluconazole 200 mg Oral Daily  . HYDROcodone-acetaminophen 1 tablet Oral Once  . magic mouthwash w/lidocaine 5 mL Oral QID  . pantoprazole 40 mg Oral Daily  . predniSONE 5 mg Oral Q breakfast  . sodium chloride 3 mL Intravenous Q12H  . tacrolimus 2 mg Oral BID  . vancomycin 2,000 mg Intravenous Once  . [START ON 12/19/2014] vancomycin 1,000 mg Intravenous Q T,Th,Sa-HD

## 2014-12-21 ENCOUNTER — Inpatient Hospital Stay (HOSPITAL_COMMUNITY): Payer: 59

## 2014-12-21 DIAGNOSIS — A419 Sepsis, unspecified organism: Secondary | ICD-10-CM

## 2014-12-21 DIAGNOSIS — R000 Tachycardia, unspecified: Secondary | ICD-10-CM

## 2014-12-21 DIAGNOSIS — R0600 Dyspnea, unspecified: Secondary | ICD-10-CM

## 2014-12-21 LAB — BASIC METABOLIC PANEL
Anion gap: 9 (ref 5–15)
BUN: 34 mg/dL — ABNORMAL HIGH (ref 6–20)
CALCIUM: 8.7 mg/dL — AB (ref 8.9–10.3)
CO2: 27 mmol/L (ref 22–32)
CREATININE: 5.44 mg/dL — AB (ref 0.44–1.00)
Chloride: 101 mmol/L (ref 101–111)
GFR calc non Af Amer: 8 mL/min — ABNORMAL LOW (ref >60)
GFR, EST AFRICAN AMERICAN: 9 mL/min — AB (ref >60)
GLUCOSE: 89 mg/dL (ref 65–99)
Potassium: 4.3 mmol/L (ref 3.5–5.1)
Sodium: 137 mmol/L (ref 135–145)

## 2014-12-21 LAB — CBC
HEMATOCRIT: 26.6 % — AB (ref 36.0–46.0)
Hemoglobin: 8.5 g/dL — ABNORMAL LOW (ref 12.0–15.0)
MCH: 32.7 pg (ref 26.0–34.0)
MCHC: 32 g/dL (ref 30.0–36.0)
MCV: 102.3 fL — ABNORMAL HIGH (ref 78.0–100.0)
PLATELETS: 216 10*3/uL (ref 150–400)
RBC: 2.6 MIL/uL — ABNORMAL LOW (ref 3.87–5.11)
RDW: 13.7 % (ref 11.5–15.5)
WBC: 3.3 10*3/uL — ABNORMAL LOW (ref 4.0–10.5)

## 2014-12-21 LAB — T4, FREE: Free T4: 0.97 ng/dL (ref 0.61–1.12)

## 2014-12-21 MED ORDER — DARBEPOETIN ALFA 150 MCG/0.3ML IJ SOSY
150.0000 ug | PREFILLED_SYRINGE | INTRAMUSCULAR | Status: DC
  Administered 2014-12-21: 150 ug via INTRAVENOUS

## 2014-12-21 MED ORDER — HEPARIN SODIUM (PORCINE) 1000 UNIT/ML DIALYSIS: 1000.0000 [IU] | INTRAMUSCULAR | Status: DC | PRN

## 2014-12-21 MED ORDER — TECHNETIUM TC 99M DIETHYLENETRIAME-PENTAACETIC ACID: 32.2000 | Freq: Once | INTRAVENOUS | Status: DC | PRN

## 2014-12-21 MED ORDER — ALTEPLASE 2 MG IJ SOLR
2.0000 mg | Freq: Once | INTRAMUSCULAR | Status: DC | PRN
  Filled 2014-12-21: qty 2

## 2014-12-21 MED ORDER — SODIUM CHLORIDE 0.9 % IV SOLN: 100.0000 mL | INTRAVENOUS | Status: DC | PRN

## 2014-12-21 MED ORDER — LIDOCAINE-PRILOCAINE 2.5-2.5 % EX CREA: 1.0000 "application " | TOPICAL_CREAM | CUTANEOUS | Status: DC | PRN

## 2014-12-21 MED ORDER — DARBEPOETIN ALFA 150 MCG/0.3ML IJ SOSY
PREFILLED_SYRINGE | INTRAMUSCULAR | Status: AC
  Filled 2014-12-21: qty 0.3

## 2014-12-21 MED ORDER — PENTAFLUOROPROP-TETRAFLUOROETH EX AERO: 1.0000 "application " | INHALATION_SPRAY | CUTANEOUS | Status: DC | PRN

## 2014-12-21 MED ORDER — LIDOCAINE HCL (PF) 1 % IJ SOLN: 5.0000 mL | INTRAMUSCULAR | Status: DC | PRN

## 2014-12-21 MED ORDER — TECHNETIUM TO 99M ALBUMIN AGGREGATED
4.3000 | Freq: Once | INTRAVENOUS | Status: AC | PRN
  Administered 2014-12-21: 4 via INTRAVENOUS

## 2014-12-21 MED ORDER — HEPARIN SODIUM (PORCINE) 1000 UNIT/ML DIALYSIS
4000.0000 [IU] | Freq: Once | INTRAMUSCULAR | Status: AC
  Administered 2014-12-21: 4000 [IU] via INTRAVENOUS_CENTRAL

## 2014-12-21 MED ORDER — ZOLPIDEM TARTRATE 5 MG PO TABS
5.0000 mg | ORAL_TABLET | Freq: Once | ORAL | Status: DC
Start: 2014-12-21 — End: 2014-12-22
  Filled 2014-12-21: qty 1

## 2014-12-21 NOTE — Progress Notes (Signed)
Physical Therapy Treatment Patient Details Name: Karen [name] MRN: [id number] DOB: 1955-10-16 Today's Date: 12/21/2014    History of Present Illness Karen [name] is a 59 y.o. female h/o ESRD, dialysis TTS, who presents to the ED with c/o fatigue today after dialysis. She was diagnosed with a viral URI yesterday at [facility]. Today had tachycardia after dialysis. Complains of sore throat, no chest pain.    PT Comments    Willing ot work with PT including going up/down a flight of steps after HD; taking things slowly, but overall managing well; will likely meet acute care PT goals next session  Follow Up Recommendations  Home health PT;Supervision - Intermittent (May not need HHPT; will consider changing recs next session)     Equipment Recommendations  None recommended by PT    Recommendations for Other Services       Precautions / Restrictions Precautions Precautions: Fall    Mobility  Bed Mobility Overal bed mobility: Modified Independent                Transfers Overall transfer level: Modified independent Equipment used: None Transfers: Sit to/from Stand Sit to Stand: Modified independent (Device/Increase time)            Ambulation/Gait Ambulation/Gait assistance: Supervision Ambulation Distance (Feet): 220 Feet Assistive device: None Gait Pattern/deviations: Step-through pattern;Decreased stride length Gait velocity: slower Gait velocity interpretation: Below normal speed for age/gender General Gait Details: slow gait, pt with fatigue post HD; cues to self-monitor for activity tolerance   Stairs Stairs: Yes Stairs assistance: Min guard Stair Management: One rail Left;Alternating pattern;Step to pattern;Forwards Number of Stairs: 12 General stair comments: Taking stairs slowly, but steadily; standing rest break at the top before coming back down  Wheelchair Mobility    Modified Rankin (Stroke Patients Only)       Balance             Standing balance-Leahy Scale: Fair                      Cognition Arousal/Alertness: Awake/alert Behavior During Therapy: WFL for tasks assessed/performed Overall Cognitive Status: Within Functional Limits for tasks assessed                      Exercises      General Comments        Pertinent Vitals/Pain Pain Assessment: No/denies pain (just fatigue)    Home Living                      Prior Function            PT Goals (current goals can now be found in the care plan section) Acute Rehab PT Goals Patient Stated Goal: home because no one can care for my husband PT Goal Formulation: With patient Time For Goal Achievement: 12/24/14 Potential to Achieve Goals: Good Progress towards PT goals: Progressing toward goals    Frequency  Min 3X/week    PT Plan Current plan remains appropriate    Co-evaluation             End of Session   Activity Tolerance: Patient tolerated treatment well Patient left: in bed;with call bell/phone within reach     Time: 1520-1548 PT Time Calculation (min) (ACUTE ONLY): 28 min  Charges:  $Gait Training: 23-37 mins                    G Codes:  Roney Marion [facility] 12/21/2014, 4:33 PM  Roney Marion, [place] Pager [phone] Office [phone]

## 2014-12-21 NOTE — Progress Notes (Signed)
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  At that time, she was tested for HIV, hepatitis, BK virus, EBV, CMV, and C. Difficile, ANA, MPO, PR3, crypto Ag, HSV all of which were negative.  Ferritin 3564, CRP 19.7, ESR 40.  Complement levels were normal.  Her bactrim was held due to transaminitis and her valcyte was held due to leukopenia.  They anticipated starting dapson.  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    She was seen at the [facility] ED on 11/28 for fever and sore throat and discharged home.  She was admitted during his hospitalization for concern for pericarditis given suggestive appearance on CXR for pericardial effusion, however, she has not had significant chest pain.    Assessment and Plan  Sepsis (tachycardia, tachypnea, fever 101F) secondary to pyelonephritis/UTI, Klebsiella, present prior to admission.  NOT catheter associated.  DDx also includes underlying vasculitis given very elevated ESR.  Temperature and WBC appear to be trending down -  Blood culture NGTD -  [place] pending -  RSV neg -  PCP sputum stain negative -  CXR negative -  ID assistance appreciated -  ESR 136, complement levels wnl and cryoglobulin pending -  Continue ceftaz for 7 days  Sinus tachycardia finally resolving -  Repeat TSH low but fT4 wnl -  D-dimer elevated, VQ neg -  ECHO:  Grade 2 DD, EF 60-65%  Incidentally seen liver mass on ECHO -  Abdominal US >> suspect this may have been her transplanted kidney?  Sore throat, possible thrush.  Sore throat is finally improving -  GAS neg  -  Continue  fluconazole and magic mouthwash with lidocaine for symptom relief  ESRD on HD -  Hd on THSa schedule  Failed renal transplant -  Continue immunosuppression -  Prophylaxis deferred to [facility] who will likely start Dapsone  Diet:  renal Access:  PIV IVF:  off Proph:  Heparin  Code Status: full Family Communication: patient alone Disposition Plan:  Home tomorrow if RUQ US demonstrates benign finding   Consultants:  ID  Nephrology  Procedures:  HD  Antibiotics:  Fluconazole 12/1 >    vanc and fortaz 12/2  HPI/Subjective:  Feeling better.  Sore throat is improving.  Still dyspneic with exertion, but this has been going on for a while, was worse earlier during hospitalization but may be getting better  Objective: Filed Vitals:   12/21/14 1000 12/21/14 1030 12/21/14 1105 12/21/14 1158  BP: 120/78 112/71 133/78 135/74  Pulse: 99 97 97 96  Temp:   98.4 F (36.9 C) 98.3 F (36.8 C)  TempSrc:   Oral Oral  Resp:   18 18  Height:      Weight:   85.6 kg (188 lb 11.4 oz)   SpO2:   97% 100%    Intake/Output Summary (Last 24 hours) at 12/21/14 1800 Last data filed at 12/21/14 1203  Gross per 24 hour  Intake    480 ml  Output      0 ml  Net    480 ml   Filed Weights   12/21/14 0454 12/21/14 0655 12/21/14 1105  Weight:  85.367 kg (188 lb 3.2 oz) 84.5 kg (186 lb 4.6 oz) 85.6 kg (188 lb 11.4 oz)   Body mass index is 35.68 kg/(m^2).  Exam:   General:  Adult female, seen in HD  HEENT:  NCAT, MMM, no further evidence of thrush   Cardiovascular:  RRR, nl S1, S2 no mrg, 2+ pulses, warm extremities  Respiratory:  CTAB, no increased WOB  Abdomen:   NABS, soft, NT/ND  MSK:   Normal tone and bulk, no LEE  Neuro:  Bells palsy on right  Data Reviewed: Basic Metabolic Panel:  Recent Labs Lab 12/17/14 0825 12/18/14 0904 12/19/14 0322 12/20/14 0316 12/21/14 0414  NA 138 139 137 136 137  K 4.0 4.2 4.0 4.2 4.3  CL 101 102 102 99* 101  CO2 $Re'24 26 27 29 27   'RyC$ GLUCOSE 99 115* 120* 130* 89  BUN 30* 17 30* 16 34*  CREATININE 5.91* 4.30* 5.09* 3.51* 5.44*  CALCIUM 8.3* 8.3* 8.5* 8.5* 8.7*  PHOS 4.6  --   --   --   --    Liver Function Tests:  Recent Labs Lab 12/15/14 1851 12/17/14 0825 12/18/14 0904  AST 19  --  13*  ALT 36  --  17  ALKPHOS 97  --  75  BILITOT 0.5  --  0.4  PROT 6.2*  --  5.7*  ALBUMIN 3.2* 2.7* 2.5*   No results for input(s): LIPASE, AMYLASE in the last 168 hours. No results for input(s): AMMONIA in the last 168 hours. CBC:  Recent Labs Lab 12/15/14 1851  12/17/14 0824 12/18/14 0904 12/19/14 0322 12/20/14 0316 12/21/14 0414  WBC 8.1  < > 12.4* 8.7 6.2 4.2 3.3*  NEUTROABS 7.1  --   --  7.6  --   --   --   HGB 9.9*  < > 9.0* 8.3* 8.0* 7.9* 8.5*  HCT 31.2*  < > 29.4* 26.3* 25.8* 25.9* 26.6*  MCV 100.3*  < > 101.0* 102.3* 103.2* 101.6* 102.3*  PLT 192  < > 179 180 192 210 216  < > = values in this interval not displayed.  Recent Results (from the past 240 hour(s))  Rapid strep screen     Status: None   Collection Time: 12/15/14  6:35 PM  Result Value Ref Range Status   Streptococcus, Group A Screen (Direct) NEGATIVE NEGATIVE Final    Comment: (NOTE) A Rapid Antigen test may result negative if the antigen level in the sample is below the detection level of this test. The FDA has not cleared this test as a stand-alone test therefore the rapid antigen negative result has reflexed to a Group A Strep culture.   Culture, Group A Strep     Status: None   Collection Time: 12/15/14  6:35 PM  Result Value Ref Range Status   Strep A Culture Negative  Final    Comment: (NOTE) Performed At: [facility] [address] [place], [place] [id number] Lindon Romp MD [id number]   Culture, blood (routine x 2)     Status: None   Collection Time: 12/15/14  6:44 PM  Result Value Ref Range Status   Specimen Description BLOOD RIGHT HAND  Final   Special Requests BOTTLES DRAWN AEROBIC AND ANAEROBIC 5CC   Final   Culture NO GROWTH 5 DAYS  Final   Report Status 12/20/2014 FINAL  Final  Culture, Urine     Status: None   Collection Time: 12/17/14  3:50 PM  Result Value Ref  Range Status   Specimen Description URINE, CLEAN CATCH  Final   Special Requests NONE  Final   Culture >=100,000 COLONIES/mL KLEBSIELLA PNEUMONIAE  Final   Report Status 12/19/2014 FINAL  Final   Organism ID, Bacteria KLEBSIELLA PNEUMONIAE  Final      Susceptibility   Klebsiella pneumoniae - MIC*    AMPICILLIN >=32 RESISTANT Resistant     CEFAZOLIN <=4 SENSITIVE Sensitive     CEFTRIAXONE <=1 SENSITIVE Sensitive     CIPROFLOXACIN <=0.25 SENSITIVE Sensitive     GENTAMICIN <=1 SENSITIVE Sensitive     IMIPENEM <=0.25 SENSITIVE Sensitive     NITROFURANTOIN 64 INTERMEDIATE Intermediate     TRIMETH/SULFA >=320 RESISTANT Resistant     AMPICILLIN/SULBACTAM 8 SENSITIVE Sensitive     PIP/TAZO <=4 SENSITIVE Sensitive     * >=100,000 COLONIES/mL KLEBSIELLA PNEUMONIAE  Respiratory virus panel     Status: None   Collection Time: 12/18/14  5:14 AM  Result Value Ref Range Status   Source - RVPAN NASAL SWAB  Corrected   Respiratory Syncytial Virus A Negative Negative Final   Respiratory Syncytial Virus B Negative Negative Final   Influenza A Negative Negative Final   Influenza B Negative Negative Final   Parainfluenza 1 Negative Negative Final   Parainfluenza 2 Negative Negative Final   Parainfluenza 3 Negative Negative Final   Metapneumovirus Negative Negative Final   Rhinovirus Negative Negative Final   Adenovirus Negative Negative Final    Comment: (NOTE) Performed At: [facility] [address] [place], [place] [id number] Lindon Romp MD [id number]   Culture, blood (x 2)     Status: None (Preliminary result)   Collection Time: 12/18/14  9:04 AM  Result Value Ref Range Status   Specimen Description BLOOD RIGHT HAND  Final   Special Requests BOTTLES DRAWN AEROBIC ONLY 3CC  Final   Culture NO GROWTH 3  DAYS  Final   Report Status PENDING  Incomplete  Culture, blood (x 2)     Status: None (Preliminary result)   Collection Time: 12/18/14  9:13 AM  Result Value Ref Range Status   Specimen Description BLOOD RIGHT WRIST  Final   Special Requests BOTTLES DRAWN AEROBIC ONLY 3CC  Final   Culture NO GROWTH 3 DAYS  Final   Report Status PENDING  Incomplete  Culture, expectorated sputum-assessment     Status: None   Collection Time: 12/20/14  9:24 AM  Result Value Ref Range Status   Specimen Description SPU  Final   Special Requests Immunocompromised  Final   Sputum evaluation   Final    THIS SPECIMEN IS ACCEPTABLE. RESPIRATORY CULTURE REPORT TO FOLLOW.   Report Status 12/20/2014 FINAL  Final  Pneumocystis smear by DFA     Status: None   Collection Time: 12/20/14  9:24 AM  Result Value Ref Range Status   Specimen Source-PJSRC NONE  Final   Pneumocystis jiroveci Ag NEGATIVE  Final    Comment: PERFORMED AT [place]  Culture, respiratory (NON-Expectorated)     Status: None (Preliminary result)   Collection Time: 12/20/14  9:25 AM  Result Value Ref Range Status   Specimen Description SPUTUM  Final   Special Requests NONE  Final   Gram Stain   Final    THIS SPECIMEN IS ACCEPTABLE FOR SPUTUM CULTURE ABUNDANT WBC PRESENT, PREDOMINANTLY PMN FEW SQUAMOUS EPITHELIAL CELLS PRESENT MODERATE GRAM POSITIVE COCCI IN PAIRS IN CHAINS IN CLUSTERS FEW GRAM NEGATIVE COCCI    Culture   Final  Culture reincubated for better growth Performed at [facility]    Report Status PENDING  Incomplete     Studies: Nm Pulmonary Perf And Vent  12/21/2014  CLINICAL DATA:  Tachycardia.  Elevated D-dimer and dyspnea. EXAM: NUCLEAR MEDICINE VENTILATION - PERFUSION LUNG SCAN TECHNIQUE: Ventilation images were obtained in multiple projections using inhaled aerosol Tc-58m DTPA. Perfusion images were obtained in multiple projections after intravenous injection of Tc-78m MAA.  RADIOPHARMACEUTICALS:  32.2 Technetium-35m DTPA aerosol inhalation and 4.3 Technetium-57m MAA IV COMPARISON:  Chest radiograph dated 12/18/2014 FINDINGS: Ventilation: There is a single moderate to large segmental defect in the right mid thorax. Perfusion: There is a single moderate to large segmental defect in the right mid thorax. IMPRESSION: Single matched moderate to large ventilation-profusion segmental defect in the right mid thorax. Low probability, 10-19%, of pulmonary embolus. Electronically Signed   By: Fidela Salisbury M.D.   On: 12/21/2014 13:55    Scheduled Meds: . aspirin EC  81 mg Oral Daily  . cefTAZidime (FORTAZ)  IV  2 g Intravenous Q M,W,F-HD  . Darbepoetin Alfa      . darbepoetin (ARANESP) injection - DIALYSIS  150 mcg Intravenous Q Mon-HD  . fluconazole  200 mg Oral Daily  . magic mouthwash w/lidocaine  5 mL Oral QID  . pantoprazole  40 mg Oral Daily  . predniSONE  5 mg Oral Q breakfast  . sodium chloride  3 mL Intravenous Q12H  . tacrolimus  2 mg Oral BID   Continuous Infusions:   Principal Problem:   Fever, unspecified Active Problems:   End stage renal disease on dialysis (HCC)   Thrush   Erythema induratum   Febrile illness   Fever   Sepsis ([place])   Infection of urinary tract    Time spent: 30 min    [place], [place] [facility] Pager [phone]. If 7PM-7AM, please contact night-coverage at [email or web address], password [facility] 12/21/2014, 6:00 PM  LOS: 3 days

## 2014-12-21 NOTE — Progress Notes (Signed)
ANTIBIOTIC CONSULT NOTE - FOLLOW UP  Pharmacy Consult for ceftazidime Indication: Klebsiella UTI  No Known Allergies  Patient Measurements: Height: 5\' 1"  (154.9 cm) Weight: 188 lb 11.4 oz (85.6 kg) IBW/kg (Calculated) : 47.8  Vital Signs: Temp: 98.3 F (36.8 C) (12/05 1158) Temp Source: Oral (12/05 1158) BP: 135/74 mmHg (12/05 1158) Pulse Rate: 96 (12/05 1158) Intake/Output from previous day: 12/04 0701 - 12/05 0700 In: 720 [P.O.:720] Out: 0  Intake/Output from this shift:    Labs:  Recent Labs  12/19/14 0322 12/20/14 0316 12/21/14 0414  WBC 6.2 4.2 3.3*  HGB 8.0* 7.9* 8.5*  PLT 192 210 216  CREATININE 5.09* 3.51* 5.44*   Estimated Creatinine Clearance: 11.1 mL/min (by C-G formula based on Cr of 5.44). No results for input(s): VANCOTROUGH, VANCOPEAK, VANCORANDOM, GENTTROUGH, GENTPEAK, GENTRANDOM, TOBRATROUGH, TOBRAPEAK, TOBRARND, AMIKACINPEAK, AMIKACINTROU, AMIKACIN in the last 72 hours.   Microbiology: Recent Results (from the past 720 hour(s))  Rapid strep screen     Status: None   Collection Time: 12/15/14  6:35 PM  Result Value Ref Range Status   Streptococcus, Group A Screen (Direct) NEGATIVE NEGATIVE Final    Comment: (NOTE) A Rapid Antigen test may result negative if the antigen level in the sample is below the detection level of this test. The FDA has not cleared this test as a stand-alone test therefore the rapid antigen negative result has reflexed to a Group A Strep culture.   Culture, Group A Strep     Status: None   Collection Time: 12/15/14  6:35 PM  Result Value Ref Range Status   Strep A Culture Negative  Final    Comment: (NOTE) Performed At: [facility] [place], [place] [id number] Lindon Romp MD [id number]   Culture, blood (routine x 2)     Status: None   Collection Time: 12/15/14  6:44 PM  Result Value Ref Range Status   Specimen Description BLOOD RIGHT HAND  Final   Special Requests BOTTLES DRAWN  AEROBIC AND ANAEROBIC 5CC  Final   Culture NO GROWTH 5 DAYS  Final   Report Status 12/20/2014 FINAL  Final  Culture, Urine     Status: None   Collection Time: 12/17/14  3:50 PM  Result Value Ref Range Status   Specimen Description URINE, CLEAN CATCH  Final   Special Requests NONE  Final   Culture >=100,000 COLONIES/mL KLEBSIELLA PNEUMONIAE  Final   Report Status 12/19/2014 FINAL  Final   Organism ID, Bacteria KLEBSIELLA PNEUMONIAE  Final      Susceptibility   Klebsiella pneumoniae - MIC*    AMPICILLIN >=32 RESISTANT Resistant     CEFAZOLIN <=4 SENSITIVE Sensitive     CEFTRIAXONE <=1 SENSITIVE Sensitive     CIPROFLOXACIN <=0.25 SENSITIVE Sensitive     GENTAMICIN <=1 SENSITIVE Sensitive     IMIPENEM <=0.25 SENSITIVE Sensitive     NITROFURANTOIN 64 INTERMEDIATE Intermediate     TRIMETH/SULFA >=320 RESISTANT Resistant     AMPICILLIN/SULBACTAM 8 SENSITIVE Sensitive     PIP/TAZO <=4 SENSITIVE Sensitive     * >=100,000 COLONIES/mL KLEBSIELLA PNEUMONIAE  Respiratory virus panel     Status: None   Collection Time: 12/18/14  5:14 AM  Result Value Ref Range Status   Source - RVPAN NASAL SWAB  Corrected   Respiratory Syncytial Virus A Negative Negative Final   Respiratory Syncytial Virus B Negative Negative Final   Influenza A Negative Negative Final   Influenza B Negative Negative Final  Parainfluenza 1 Negative Negative Final   Parainfluenza 2 Negative Negative Final   Parainfluenza 3 Negative Negative Final   Metapneumovirus Negative Negative Final   Rhinovirus Negative Negative Final   Adenovirus Negative Negative Final    Comment: (NOTE) Performed At: [facility] [address] [place], [place] [id number] Lindon Romp MD [id number]   Culture, blood (x 2)     Status: None (Preliminary result)   Collection Time: 12/18/14  9:04 AM  Result Value Ref Range Status   Specimen Description BLOOD RIGHT HAND  Final   Special Requests BOTTLES DRAWN AEROBIC ONLY 3CC  Final    Culture NO GROWTH 3 DAYS  Final   Report Status PENDING  Incomplete  Culture, blood (x 2)     Status: None (Preliminary result)   Collection Time: 12/18/14  9:13 AM  Result Value Ref Range Status   Specimen Description BLOOD RIGHT WRIST  Final   Special Requests BOTTLES DRAWN AEROBIC ONLY 3CC  Final   Culture NO GROWTH 3 DAYS  Final   Report Status PENDING  Incomplete  Culture, expectorated sputum-assessment     Status: None   Collection Time: 12/20/14  9:24 AM  Result Value Ref Range Status   Specimen Description SPU  Final   Special Requests Immunocompromised  Final   Sputum evaluation   Final    THIS SPECIMEN IS ACCEPTABLE. RESPIRATORY CULTURE REPORT TO FOLLOW.   Report Status 12/20/2014 FINAL  Final  Pneumocystis smear by DFA     Status: None   Collection Time: 12/20/14  9:24 AM  Result Value Ref Range Status   Specimen Source-PJSRC NONE  Final   Pneumocystis jiroveci Ag NEGATIVE  Final    Comment: PERFORMED AT [place]  Culture, respiratory (NON-Expectorated)     Status: None (Preliminary result)   Collection Time: 12/20/14  9:25 AM  Result Value Ref Range Status   Specimen Description SPUTUM  Final   Special Requests NONE  Final   Gram Stain   Final    THIS SPECIMEN IS ACCEPTABLE FOR SPUTUM CULTURE ABUNDANT WBC PRESENT, PREDOMINANTLY PMN FEW SQUAMOUS EPITHELIAL CELLS PRESENT MODERATE GRAM POSITIVE COCCI IN PAIRS IN CHAINS IN CLUSTERS FEW GRAM NEGATIVE COCCI    Culture   Final    Culture reincubated for better growth Performed at [facility]    Report Status PENDING  Incomplete    Anti-infectives    Start     Dose/Rate Route Frequency Ordered Stop   12/21/14 1800  cefTAZidime (FORTAZ) 2 g in dextrose 5 % 50 mL IVPB     2 g 100 mL/hr over 30 Minutes Intravenous Every M-W-F (Hemodialysis) 12/20/14 1715     12/19/14 1200  vancomycin (VANCOCIN) IVPB 1000 mg/200 mL premix  Status:  Discontinued     1,000 mg 200 mL/hr over 60 Minutes  Intravenous Every T-Th-Sa (Hemodialysis) 12/18/14 0824 12/20/14 1244   12/19/14 1200  cefTAZidime (FORTAZ) 2 g in dextrose 5 % 50 mL IVPB  Status:  Discontinued     2 g 100 mL/hr over 30 Minutes Intravenous Every T-Th-Sa (Hemodialysis) 12/18/14 0925 12/20/14 1715   12/18/14 1600  piperacillin-tazobactam (ZOSYN) IVPB 2.25 g  Status:  Discontinued     2.25 g 100 mL/hr over 30 Minutes Intravenous Every 8 hours 12/18/14 0824 12/18/14 0909   12/18/14 0930  cefTAZidime (FORTAZ) 2 g in dextrose 5 % 50 mL IVPB     2 g 100 mL/hr over 30 Minutes Intravenous  Once  12/18/14 0924 12/18/14 1041   12/18/14 0830  vancomycin (VANCOCIN) 2,000 mg in sodium chloride 0.9 % 500 mL IVPB     2,000 mg 250 mL/hr over 120 Minutes Intravenous  Once 12/18/14 0824 12/18/14 1207   12/18/14 0830  piperacillin-tazobactam (ZOSYN) IVPB 2.25 g  Status:  Discontinued     2.25 g 100 mL/hr over 30 Minutes Intravenous  Once 12/18/14 0824 12/18/14 0909   12/18/14 0800  cefTRIAXone (ROCEPHIN) 1 g in dextrose 5 % 50 mL IVPB  Status:  Discontinued     1 g 100 mL/hr over 30 Minutes Intravenous Every 24 hours 12/18/14 0713 12/18/14 0735   12/18/14 0800  vancomycin (VANCOCIN) IVPB 1000 mg/200 mL premix  Status:  Discontinued     1,000 mg 200 mL/hr over 60 Minutes Intravenous  Once 12/18/14 0735 12/18/14 0917   12/18/14 0745  piperacillin-tazobactam (ZOSYN) IVPB 3.375 g  Status:  Discontinued     3.375 g 100 mL/hr over 30 Minutes Intravenous  Once 12/18/14 0735 12/18/14 0847   12/17/14 1800  fluconazole (DIFLUCAN) tablet 200 mg     200 mg Oral Daily 12/17/14 1704        Assessment: 59 y/o female with ESRD on HD MWF and back on normal schedule. She continues on day 4 ceftazidime for a Klebsiella UTI. She is afebrile and WBC are on low side. To complete 7 days of therapy per ID recommendation.  Tressie Ellis 12/2 >> (7 days would be 12/8)  Goal of Therapy:  Eradication of infection  Plan:  - Continue ceftazidime 2 g IV after HD on  MWF - Please enter stop date of 7 days (through 12/8) - Pharmacy signing off, please re-consult if needed  [facility], [place].D., BCPS Clinical Pharmacist Pager: [phone] 12/21/2014 1:20 PM

## 2014-12-21 NOTE — Procedures (Signed)
I have seen and examined this patient and agree with the plan of care . Patient seen on dialysis and has no complaints   Danese Dorsainvil W 12/21/2014, 10:40 AM

## 2014-12-21 NOTE — Progress Notes (Signed)
Physical Therapy Cancellation Note:  Pt currently in HD, will check back later today as time allows.   Leighton Roach, [place]  [phone]

## 2014-12-21 NOTE — Progress Notes (Signed)
Pt refusing bed alarm tonight, pt states that she is able to get out of bed by herself.

## 2014-12-21 NOTE — Progress Notes (Signed)
[facility] ROUNDING NOTE   Subjective:   Interval History:  No complaints on dialysis this morning  Fever has improved   Objective:  Vital signs in last 24 hours:  Temp:  [97.7 F (36.5 C)-98.6 F (37 C)] 97.7 F (36.5 C) (12/05 0655) Pulse Rate:  [89-102] 99 (12/05 1000) Resp:  [18-20] 20 (12/05 0655) BP: (120-149)/(67-86) 120/78 mmHg (12/05 1000) SpO2:  [97 %-98 %] 97 % (12/05 0655) Weight:  [84.5 kg (186 lb 4.6 oz)-85.367 kg (188 lb 3.2 oz)] 84.5 kg (186 lb 4.6 oz) (12/05 0655)  Weight change: -0.933 kg (-2 lb 0.9 oz) Filed Weights   12/20/14 0409 12/21/14 0454 12/21/14 0655  Weight: 84.641 kg (186 lb 9.6 oz) 85.367 kg (188 lb 3.2 oz) 84.5 kg (186 lb 4.6 oz)    Intake/Output: I/O last 3 completed shifts: In: 86 [P.O.:960] Out: 0    Intake/Output this shift:     CVS- RRR RS- CTA ABD- BS present soft non-distended EXT- no edema  Left upper arm AVG    Basic Metabolic Panel:  Recent Labs Lab 12/17/14 0825 12/18/14 0904 12/19/14 0322 12/20/14 0316 12/21/14 0414  NA 138 139 137 136 137  K 4.0 4.2 4.0 4.2 4.3  CL 101 102 102 99* 101  CO2 24 26 27 29 27   GLUCOSE 99 115* 120* 130* 89  BUN 30* 17 30* 16 34*  CREATININE 5.91* 4.30* 5.09* 3.51* 5.44*  CALCIUM 8.3* 8.3* 8.5* 8.5* 8.7*  PHOS 4.6  --   --   --   --     Liver Function Tests:  Recent Labs Lab 12/15/14 1851 12/17/14 0825 12/18/14 0904  AST 19  --  13*  ALT 36  --  17  ALKPHOS 97  --  75  BILITOT 0.5  --  0.4  PROT 6.2*  --  5.7*  ALBUMIN 3.2* 2.7* 2.5*   No results for input(s): LIPASE, AMYLASE in the last 168 hours. No results for input(s): AMMONIA in the last 168 hours.  CBC:  Recent Labs Lab 12/15/14 1851  12/17/14 0824 12/18/14 0904 12/19/14 0322 12/20/14 0316 12/21/14 0414  WBC 8.1  < > 12.4* 8.7 6.2 4.2 3.3*  NEUTROABS 7.1  --   --  7.6  --   --   --   HGB 9.9*  < > 9.0* 8.3* 8.0* 7.9* 8.5*  HCT 31.2*  < > 29.4* 26.3* 25.8* 25.9* 26.6*  MCV 100.3*  < >  101.0* 102.3* 103.2* 101.6* 102.3*  PLT 192  < > 179 180 192 210 216  < > = values in this interval not displayed.  Cardiac Enzymes: No results for input(s): CKTOTAL, CKMB, CKMBINDEX, TROPONINI in the last 168 hours.  BNP: Invalid input(s): POCBNP  CBG: No results for input(s): GLUCAP in the last 168 hours.  Microbiology: Results for orders placed or performed during the hospital encounter of 12/15/14  Rapid strep screen     Status: None   Collection Time: 12/15/14  6:35 PM  Result Value Ref Range Status   Streptococcus, Group A Screen (Direct) NEGATIVE NEGATIVE Final    Comment: (NOTE) A Rapid Antigen test may result negative if the antigen level in the sample is below the detection level of this test. The FDA has not cleared this test as a stand-alone test therefore the rapid antigen negative result has reflexed to a Group A Strep culture.   Culture, Group A Strep     Status: None   Collection  Time: 12/15/14  6:35 PM  Result Value Ref Range Status   Strep A Culture Negative  Final    Comment: (NOTE) Performed At: [facility] [address] [place], [place] [id number] Lindon Romp MD [id number]   Culture, blood (routine x 2)     Status: None   Collection Time: 12/15/14  6:44 PM  Result Value Ref Range Status   Specimen Description BLOOD RIGHT HAND  Final   Special Requests BOTTLES DRAWN AEROBIC AND ANAEROBIC 5CC  Final   Culture NO GROWTH 5 DAYS  Final   Report Status 12/20/2014 FINAL  Final  Culture, Urine     Status: None   Collection Time: 12/17/14  3:50 PM  Result Value Ref Range Status   Specimen Description URINE, CLEAN CATCH  Final   Special Requests NONE  Final   Culture >=100,000 COLONIES/mL KLEBSIELLA PNEUMONIAE  Final   Report Status 12/19/2014 FINAL  Final   Organism ID, Bacteria KLEBSIELLA PNEUMONIAE  Final      Susceptibility   Klebsiella pneumoniae - MIC*    AMPICILLIN >=32 RESISTANT Resistant     CEFAZOLIN <=4 SENSITIVE Sensitive      CEFTRIAXONE <=1 SENSITIVE Sensitive     CIPROFLOXACIN <=0.25 SENSITIVE Sensitive     GENTAMICIN <=1 SENSITIVE Sensitive     IMIPENEM <=0.25 SENSITIVE Sensitive     NITROFURANTOIN 64 INTERMEDIATE Intermediate     TRIMETH/SULFA >=320 RESISTANT Resistant     AMPICILLIN/SULBACTAM 8 SENSITIVE Sensitive     PIP/TAZO <=4 SENSITIVE Sensitive     * >=100,000 COLONIES/mL KLEBSIELLA PNEUMONIAE  Respiratory virus panel     Status: None   Collection Time: 12/18/14  5:14 AM  Result Value Ref Range Status   Source - RVPAN NASAL SWAB  Corrected   Respiratory Syncytial Virus A Negative Negative Final   Respiratory Syncytial Virus B Negative Negative Final   Influenza A Negative Negative Final   Influenza B Negative Negative Final   Parainfluenza 1 Negative Negative Final   Parainfluenza 2 Negative Negative Final   Parainfluenza 3 Negative Negative Final   Metapneumovirus Negative Negative Final   Rhinovirus Negative Negative Final   Adenovirus Negative Negative Final    Comment: (NOTE) Performed At: [facility] [address] [place], [place] [id number] Lindon Romp MD [id number]   Culture, blood (x 2)     Status: None (Preliminary result)   Collection Time: 12/18/14  9:04 AM  Result Value Ref Range Status   Specimen Description BLOOD RIGHT HAND  Final   Special Requests BOTTLES DRAWN AEROBIC ONLY 3CC  Final   Culture NO GROWTH 2 DAYS  Final   Report Status PENDING  Incomplete  Culture, blood (x 2)     Status: None (Preliminary result)   Collection Time: 12/18/14  9:13 AM  Result Value Ref Range Status   Specimen Description BLOOD RIGHT WRIST  Final   Special Requests BOTTLES DRAWN AEROBIC ONLY 3CC  Final   Culture NO GROWTH 2 DAYS  Final   Report Status PENDING  Incomplete  Culture, expectorated sputum-assessment     Status: None   Collection Time: 12/20/14  9:24 AM  Result Value Ref Range Status   Specimen Description SPU  Final   Special Requests Immunocompromised   Final   Sputum evaluation   Final    THIS SPECIMEN IS ACCEPTABLE. RESPIRATORY CULTURE REPORT TO FOLLOW.   Report Status 12/20/2014 FINAL  Final  Pneumocystis smear by DFA     Status: None  Collection Time: 12/20/14  9:24 AM  Result Value Ref Range Status   Specimen Source-PJSRC NONE  Final   Pneumocystis jiroveci Ag NEGATIVE  Final    Comment: PERFORMED AT [facility]  Culture, respiratory (NON-Expectorated)     Status: None (Preliminary result)   Collection Time: 12/20/14  9:25 AM  Result Value Ref Range Status   Specimen Description SPUTUM  Final   Special Requests NONE  Final   Gram Stain   Final    THIS SPECIMEN IS ACCEPTABLE FOR SPUTUM CULTURE ABUNDANT WBC PRESENT, PREDOMINANTLY PMN FEW SQUAMOUS EPITHELIAL CELLS PRESENT MODERATE GRAM POSITIVE COCCI IN PAIRS IN CHAINS IN CLUSTERS FEW GRAM NEGATIVE COCCI    Culture PENDING  Incomplete   Report Status PENDING  Incomplete    Coagulation Studies: No results for input(s): LABPROT, INR in the last 72 hours.  Urinalysis: No results for input(s): COLORURINE, LABSPEC, PHURINE, GLUCOSEU, HGBUR, BILIRUBINUR, KETONESUR, PROTEINUR, UROBILINOGEN, NITRITE, LEUKOCYTESUR in the last 72 hours.  Invalid input(s): APPERANCEUR    Imaging: No results found.   Medications:     . aspirin EC  81 mg Oral Daily  . cefTAZidime (FORTAZ)  IV  2 g Intravenous Q M,W,F-HD  . darbepoetin (ARANESP) injection - DIALYSIS  150 mcg Intravenous Q Mon-HD  . fluconazole  200 mg Oral Daily  . magic mouthwash w/lidocaine  5 mL Oral QID  . pantoprazole  40 mg Oral Daily  . predniSONE  5 mg Oral Q breakfast  . sodium chloride  3 mL Intravenous Q12H  . tacrolimus  2 mg Oral BID   sodium chloride, sodium chloride, acetaminophen, alteplase, heparin, lidocaine (PF), lidocaine-prilocaine, menthol-cetylpyridinium, pentafluoroprop-tetrafluoroeth, polyethylene glycol  Assessment/ Plan:  Outpatient dialysis ::: MonWedFri, 4 hrs 0 min, 180NRe  Optiflux, BFR 400, DFR Autoflow 1.5, EDW 85.5 (kg), Dialysate 2.0 K, 2.25 Ca, UFR Profile: None, Sodium Model: None, Access: LUA AV Graft Heparin: 4000 units per treatment Venofer: 50 mg IV weekly (12/11/14) Darbepoietin started 140mcg   1. ESRD   MWF HD 2. Klebsiella UTI    Wbc 3.3 and afebrile   Fortaz 2 g Q HD  3. Failed Transplant  On dialysis and using tacrolimus and prednisone  4.  HTN / Volume continues on dialysis  To challenge EDW 5. Anemia  Hb 8.5  darbepoietin 110mcg q week started     LOS: 3 Jazmine Heckman W @TODAY @10 :45 AM

## 2014-12-22 DIAGNOSIS — A414 Sepsis due to anaerobes: Secondary | ICD-10-CM

## 2014-12-22 DIAGNOSIS — R000 Tachycardia, unspecified: Secondary | ICD-10-CM

## 2014-12-22 LAB — CBC
HEMATOCRIT: 27.5 % — AB (ref 36.0–46.0)
HEMOGLOBIN: 8.7 g/dL — AB (ref 12.0–15.0)
MCH: 32.3 pg (ref 26.0–34.0)
MCHC: 31.6 g/dL (ref 30.0–36.0)
MCV: 102.2 fL — ABNORMAL HIGH (ref 78.0–100.0)
Platelets: 252 10*3/uL (ref 150–400)
RBC: 2.69 MIL/uL — ABNORMAL LOW (ref 3.87–5.11)
RDW: 13.9 % (ref 11.5–15.5)
WBC: 3.7 10*3/uL — ABNORMAL LOW (ref 4.0–10.5)

## 2014-12-22 LAB — COXSACKIE B VIRUS ANTIBODIES
COXSACKIE B1 AB: NEGATIVE
COXSACKIE B2 AB: NEGATIVE
COXSACKIE B3 AB: NEGATIVE
COXSACKIE B4 AB: NEGATIVE
Coxsackie B5 Ab: 1:8 {titer} — ABNORMAL HIGH
Coxsackie B6 Ab: NEGATIVE

## 2014-12-22 LAB — LEGIONELLA PNEUMOPHILA SEROGP 1 UR AG: L. pneumophila Serogp 1 Ur Ag: NEGATIVE

## 2014-12-22 LAB — BASIC METABOLIC PANEL
Anion gap: 10 (ref 5–15)
BUN: 23 mg/dL — AB (ref 6–20)
CALCIUM: 9.2 mg/dL (ref 8.9–10.3)
CHLORIDE: 101 mmol/L (ref 101–111)
CO2: 29 mmol/L (ref 22–32)
CREATININE: 3.64 mg/dL — AB (ref 0.44–1.00)
GFR calc Af Amer: 15 mL/min — ABNORMAL LOW (ref >60)
GFR calc non Af Amer: 13 mL/min — ABNORMAL LOW (ref >60)
GLUCOSE: 90 mg/dL (ref 65–99)
Potassium: 4.7 mmol/L (ref 3.5–5.1)
Sodium: 140 mmol/L (ref 135–145)

## 2014-12-22 LAB — CULTURE, RESPIRATORY

## 2014-12-22 LAB — CULTURE, RESPIRATORY W GRAM STAIN: Culture: NORMAL

## 2014-12-22 MED ORDER — MAGIC MOUTHWASH W/LIDOCAINE: 5.0000 mL | Freq: Four times a day (QID) | ORAL | Status: DC

## 2014-12-22 MED ORDER — FLUCONAZOLE 200 MG PO TABS: 200.0000 mg | ORAL_TABLET | Freq: Every day | ORAL | Status: DC

## 2014-12-22 NOTE — Care Management Note (Signed)
Case Management Note  Patient Details  Name: Karen [name] MRN: [id number] Date of Birth: [birth year]  Subjective/Objective:            Admitted with Fever        Action/Plan: Lives at home with spouse, mod Independent; Physical Therapist recommended HHPT, patient refused at this time. Patient stated that she does not need any equipment/ DME. Has Private insurance with [facility] with prescription drug coverage, no problems getting medication.  Expected Discharge Date:    12/22/2014              Expected Discharge Plan:  Home/Self Care  Discharge planning Services  CM Consult Choice offered to:  Patient  HH Arranged:  Patient Refused    Status of Service:  In process, will continue to follow  Sherrilyn Rist [id number] 12/22/2014, 11:43 AM

## 2014-12-22 NOTE — Progress Notes (Signed)
Patient given discharge instructions with prescription. Verbalized understanding of discharge packet. IV's and telemetry removed. Patient left hospital via wheelchair with all belongings.

## 2014-12-22 NOTE — Care Management Important Message (Signed)
Important Message  Patient Details  Name: Karen [name] MRN: [id number] Date of Birth: January 17, 1956   Medicare Important Message Given:  Yes    Mandy Peeks P Maelynn Moroney 12/22/2014, 3:54 PM

## 2014-12-22 NOTE — Discharge Summary (Addendum)
Physician Discharge Summary  Karen [name] OIT:[id number] DOB: 11-Sep-1955 DOA: 12/15/2014  PCP: Elyn Peers, MD  Admit date: 12/15/2014 Discharge date: 12/22/2014  Recommendations for Outpatient Follow-up:  1. Home without home health services 2. Continue ceftaz through 12/9, then stop 3. Fluconazole through 12/16, then stop 4. Repeat TSH in late December or early January 5. Renal transplant team to please address dapsone prophylaxis at next visit 6. Rheumatology referral in 1 month.  Repeat ESR and please follow up pending cryoglobulin level  Discharge Diagnoses:  Principal Problem:   Klebsiella pneumoniae sepsis ([place]) Active Problems:   End stage renal disease on dialysis ([place])   Thrush   Erythema induratum   Febrile illness   Fever   Sepsis ([place])   Infection of urinary tract   Sinus tachycardia ([place])   Dyspnea   Discharge Condition: stable, improved  Diet recommendation: renal  Wt Readings from Last 3 Encounters:  12/22/14 85.2 kg (187 lb 13.3 oz)  11/19/14 89.858 kg (198 lb 1.6 oz)  04/28/14 95.89 kg (211 lb 6.4 oz)    History of present illness:   The patient is a 59 year old female on hemodialysis secondary to a 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 At that time, she was tested for HIV, hepatitis, BK virus, EBV, CMV, and C. Difficile, ANA, MPO, PR3, crypto Ag, HSV all of which were negative. Ferritin 3564, CRP 19.7, ESR 40. Complement levels were normal. Her bactrim was held due to transaminitis and her valcyte was held due to leukopenia. They anticipated starting dapson. 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   She was seen at the [facility] ED on 11/28 for fever and sore throat and discharged home, however, her sore throat and fevers and malaise became progressively  worse so she presented to [facility] where she met sepsis criteria and was admitted.    Hospital Course:  Sepsis (tachycardia, tachypnea, fever 101F) secondary to Klebsiella pyelonephritis, present prior to admission. NOT catheter associated. DDx also initially included underlying vasculitis-related fevers, however, she improved on antibiotics. She was started on broad spectrum antibiotics which were narrowed to ceftazidime once her urine culture was speciated.  She should complete 7-days of antibiotics at hemodialysis.  Due to a cough, she had additional testing for respiratory infection was done.  Respiratory viral panel was negative.  PCP sputum gram stain was negative.  CXR remained clear.    Erythema induratum, no rash present during hospitalization.  ESR was 136, complement levels wnl and cryoglobulin pending.  I suspect her elevated ESR was secondary to sepsis, but would recommend outpatient follow up with rheumatology at [facility] for repeat ESR and to follow up pending cryoglobulin level in a few weeks.    Sinus tachycardia, likely related to deconditioning and sepsis.  She remained tachycardic to the 120s despite tx for sepsis so further work up was performed:  TSH was low but fT4 wnl, likely sick euthyroid.  D-dimer was elevated, but VQ neg.  ECHO: Grade 2 DD, EF 60-65%.  Eventually, her heart rate trended down to the 80s-90s but would quickly increase to 100s range with exertion.  Recommended physical therapy, which she declined at this time.    Incidentally seen liver mass on ECHO was NOT visualized on follow up RUQ [place].    Sore throat, possible thrush although only  one small colony was visible on soft palate. Sore throat started to improve after several days of fluconazole.  GAS neg.  ESRD on HD, converted to MWF schedule.    Failed renal transplant, Continue immunosuppression and continued avoiding nephrotoxins.  Prophylaxis deferred to [facility] who will likely start  Dapsone since it appears her G6PD test was wnl.    Consultants:  ID  Nephrology  Procedures:  HD  ECHO  VQ scan  RUQ [place]  Antibiotics:  Fluconazole 12/1 >   vanc and fortaz 12/2  Discharge Exam: Filed Vitals:   12/22/14 0530 12/22/14 1226  BP: 133/98 132/80  Pulse: 116 92  Temp: 98 F (36.7 C) 99.1 F (37.3 C)  Resp: 18 18   Filed Vitals:   12/21/14 1158 12/21/14 2043 12/22/14 0530 12/22/14 1226  BP: 135/74 124/69 133/98 132/80  Pulse: 96 100 116 92  Temp: 98.3 F (36.8 C) 98 F (36.7 C) 98 F (36.7 C) 99.1 F (37.3 C)  TempSrc: Oral Oral Oral Oral  Resp: $Remo'18 18 18 18  'kqBkS$ Height:      Weight:   85.2 kg (187 lb 13.3 oz)   SpO2: 100% 100% 98% 98%     General: Adult female, seen in HD  HEENT: NCAT, MMM, no further evidence of thrush, poor dentition, partially edentulous  Cardiovascular: RRR, nl S1, S2 no mrg, 2+ pulses, warm extremities  Respiratory: CTAB, no increased WOB  Abdomen: NABS, soft, NT/ND  MSK: Normal tone and bulk, no LEE  Neuro: Mild Bells palsy on right  Discharge Instructions      Discharge Instructions    Call MD for:  difficulty breathing, headache or visual disturbances    Complete by:  As directed      Call MD for:  extreme fatigue    Complete by:  As directed      Call MD for:  hives    Complete by:  As directed      Call MD for:  persistant dizziness or light-headedness    Complete by:  As directed      Call MD for:  persistant nausea and vomiting    Complete by:  As directed      Call MD for:  severe uncontrolled pain    Complete by:  As directed      Call MD for:  temperature >100.4    Complete by:  As directed      Diet - low sodium heart healthy    Complete by:  As directed      Increase activity slowly    Complete by:  As directed             Medication List    TAKE these medications        acetaminophen 500 MG tablet  Commonly known as:  TYLENOL  Take 500 mg by mouth every 6 (six) hours as  needed for mild pain, moderate pain or headache.     ARANESP IJ  Inject as directed every 30 (thirty) days.     aspirin 81 MG tablet  Take 81 mg by mouth daily.     ethyl chloride spray  Apply 1 application topically 3 (three) times a week. Use on arms and legs on Monday Wednesday and fridays     fluconazole 200 MG tablet  Commonly known as:  DIFLUCAN  Take 1 tablet (200 mg total) by mouth daily.     INTEGRA PO  Take 1 tablet by mouth daily.  magic mouthwash w/lidocaine Soln  Take 5 mLs by mouth 4 (four) times daily.     pantoprazole 40 MG tablet  Commonly known as:  PROTONIX  TAKE 1 TABLET (40 MG TOTAL) BY MOUTH DAILY.     polyethylene glycol packet  Commonly known as:  MIRALAX / GLYCOLAX  Take 17 g by mouth daily as needed for mild constipation.     predniSONE 5 MG tablet  Commonly known as:  DELTASONE  Take 1 tablet by mouth daily.     tacrolimus 1 MG capsule  Commonly known as:  PROGRAF  Take 2 mg by mouth 2 (two) times daily.       Follow-up Information    Follow up with Elyn Peers, MD On 12/28/2014.   Specialty:  Family Medicine   Why:  @  5:00pm  ... confirmed w/ Ivonne Andrew information:   [address] [place] [address] [phone]        The results of significant diagnostics from this hospitalization (including imaging, microbiology, ancillary and laboratory) are listed below for reference.    Significant Diagnostic Studies: Dg Chest 2 View  12/15/2014  CLINICAL DATA:  59 year old female with fever intact kicked during dialysis earlier today. EXAM: CHEST  2 VIEW COMPARISON:  Chest x-ray 05/30/2014. FINDINGS: Lung volumes are normal. Trace bilateral pleural effusions. No acute consolidative airspace disease. No evidence of pulmonary edema. Mild enlargement of the cardiopericardial silhouette which appears new compared to the prior study. The patient is rotated to the left on today's exam, resulting in distortion of the mediastinal  contours and reduced diagnostic sensitivity and specificity for mediastinal pathology. IMPRESSION: 1. Mild enlargement of the cardiopericardial silhouette which appears new compared to prior examinations. The appearance of this is suspicious for interval development of a pericardial effusion. Clinical correlation is recommended. 2. Trace bilateral pleural effusions. Electronically Signed   By: Vinnie Langton M.D.   On: 12/15/2014 19:38   Nm Pulmonary Perf And Vent  12/21/2014  CLINICAL DATA:  Tachycardia.  Elevated D-dimer and dyspnea. EXAM: NUCLEAR MEDICINE VENTILATION - PERFUSION LUNG SCAN TECHNIQUE: Ventilation images were obtained in multiple projections using inhaled aerosol Tc-77m DTPA. Perfusion images were obtained in multiple projections after intravenous injection of Tc-101m MAA. RADIOPHARMACEUTICALS:  32.2 Technetium-26m DTPA aerosol inhalation and 4.3 Technetium-33m MAA IV COMPARISON:  Chest radiograph dated 12/18/2014 FINDINGS: Ventilation: There is a single moderate to large segmental defect in the right mid thorax. Perfusion: There is a single moderate to large segmental defect in the right mid thorax. IMPRESSION: Single matched moderate to large ventilation-profusion segmental defect in the right mid thorax. Low probability, 10-19%, of pulmonary embolus. Electronically Signed   By: Fidela Salisbury M.D.   On: 12/21/2014 13:55   US Venous Img Lower Bilateral  11/25/2014  CLINICAL DATA:  Bilateral lower extremity pain and edema for the past 4 days. Evaluate for DVT. EXAM: BILATERAL LOWER EXTREMITY VENOUS DOPPLER ULTRASOUND TECHNIQUE: Gray-scale sonography with graded compression, as well as color Doppler and duplex ultrasound were performed to evaluate the lower extremity deep venous systems from the level of the common femoral vein and including the common femoral, femoral, profunda femoral, popliteal and calf veins including the posterior tibial, peroneal and gastrocnemius veins when  visible. The superficial great saphenous vein was also interrogated. Spectral Doppler was utilized to evaluate flow at rest and with distal augmentation maneuvers in the common femoral, femoral and popliteal veins. COMPARISON:  None. FINDINGS: RIGHT LOWER EXTREMITY Common Femoral  Vein: No evidence of thrombus. Normal compressibility, respiratory phasicity and response to augmentation. Saphenofemoral Junction: No evidence of thrombus. Normal compressibility and flow on color Doppler imaging. Profunda Femoral Vein: No evidence of thrombus. Normal compressibility and flow on color Doppler imaging. Femoral Vein: No evidence of thrombus. Normal compressibility, respiratory phasicity and response to augmentation. Popliteal Vein: No evidence of thrombus. Normal compressibility, respiratory phasicity and response to augmentation. Calf Veins: No evidence of thrombus. Normal compressibility and flow on color Doppler imaging. Superficial Great Saphenous Vein: No evidence of thrombus. Normal compressibility and flow on color Doppler imaging. Venous Reflux:  None. Other Findings:  None. LEFT LOWER EXTREMITY Common Femoral Vein: No evidence of thrombus. Normal compressibility, respiratory phasicity and response to augmentation. Saphenofemoral Junction: No evidence of thrombus. Normal compressibility and flow on color Doppler imaging. Profunda Femoral Vein: No evidence of thrombus. Normal compressibility and flow on color Doppler imaging. Femoral Vein: No evidence of thrombus. Normal compressibility, respiratory phasicity and response to augmentation. Popliteal Vein: No evidence of thrombus. Normal compressibility, respiratory phasicity and response to augmentation. Calf Veins: No evidence of thrombus. Normal compressibility and flow on color Doppler imaging. Superficial Great Saphenous Vein: No evidence of thrombus. Normal compressibility and flow on color Doppler imaging. Venous Reflux:  None. Other Findings:  None. IMPRESSION:  No evidence of DVT within either lower extremity. Electronically Signed   By: Sandi Mariscal M.D.   On: 11/25/2014 17:05   Dg Chest Port 1 View  12/18/2014  CLINICAL DATA:  Nonproductive cough, fever. EXAM: PORTABLE CHEST 1 VIEW COMPARISON:  December 15, 2014. FINDINGS: Stable cardiomediastinal silhouette. No pneumothorax or pleural effusion is noted. No acute pulmonary disease is noted. Stable elevation of left hemidiaphragm is noted. Bony thorax is unremarkable. IMPRESSION: No acute cardiopulmonary abnormality seen. Electronically Signed   By: Marijo Conception, M.D.   On: 12/18/2014 08:13   US Abdomen Limited Ruq  12/21/2014  CLINICAL DATA:  Possible liver mass on echo EXAM: US ABDOMEN LIMITED - RIGHT UPPER QUADRANT COMPARISON:  MRI abdomen dated 09/16/2013 FINDINGS: Gallbladder: Surgically absent. Common bile duct: Diameter: 2 mm Liver: Appearance of the liver with increased echogenicity of the portal triads. However, no focal mass is seen. Portal vein is patent. IMPRESSION: Abnormal hepatic parenchymal echogenicity, as described above. Differential considerations include periportal edema/hepatic congestion, parenchymal fibrosis related to cirrhosis, or less likely cholangitis or hepatitis. However, no focal hepatic mass is seen. Correlate with LFTs and consider CT or MRI for further evaluation. Given that patient is on dialysis, liver protocol CT abdomen with/without contrast is suggested. MR, if performed, could only be performed without contrast. Electronically Signed   By: Julian Hy M.D.   On: 12/21/2014 20:45    Microbiology: Recent Results (from the past 240 hour(s))  Rapid strep screen     Status: None   Collection Time: 12/15/14  6:35 PM  Result Value Ref Range Status   Streptococcus, Group A Screen (Direct) NEGATIVE NEGATIVE Final    Comment: (NOTE) A Rapid Antigen test may result negative if the antigen level in the sample is below the detection level of this test. The FDA has  not cleared this test as a stand-alone test therefore the rapid antigen negative result has reflexed to a Group A Strep culture.   Culture, Group A Strep     Status: None   Collection Time: 12/15/14  6:35 PM  Result Value Ref Range Status   Strep A Culture Negative  Final    Comment: (NOTE)  Performed At: [facility] [address] [place], [place] [id number] Lindon Romp MD NI:[id number]   Culture, blood (routine x 2)     Status: None   Collection Time: 12/15/14  6:44 PM  Result Value Ref Range Status   Specimen Description BLOOD RIGHT HAND  Final   Special Requests BOTTLES DRAWN AEROBIC AND ANAEROBIC 5CC  Final   Culture NO GROWTH 5 DAYS  Final   Report Status 12/20/2014 FINAL  Final  Culture, Urine     Status: None   Collection Time: 12/17/14  3:50 PM  Result Value Ref Range Status   Specimen Description URINE, CLEAN CATCH  Final   Special Requests NONE  Final   Culture >=100,000 COLONIES/mL KLEBSIELLA PNEUMONIAE  Final   Report Status 12/19/2014 FINAL  Final   Organism ID, Bacteria KLEBSIELLA PNEUMONIAE  Final      Susceptibility   Klebsiella pneumoniae - MIC*    AMPICILLIN >=32 RESISTANT Resistant     CEFAZOLIN <=4 SENSITIVE Sensitive     CEFTRIAXONE <=1 SENSITIVE Sensitive     CIPROFLOXACIN <=0.25 SENSITIVE Sensitive     GENTAMICIN <=1 SENSITIVE Sensitive     IMIPENEM <=0.25 SENSITIVE Sensitive     NITROFURANTOIN 64 INTERMEDIATE Intermediate     TRIMETH/SULFA >=320 RESISTANT Resistant     AMPICILLIN/SULBACTAM 8 SENSITIVE Sensitive     PIP/TAZO <=4 SENSITIVE Sensitive     * >=100,000 COLONIES/mL KLEBSIELLA PNEUMONIAE  Respiratory virus panel     Status: None   Collection Time: 12/18/14  5:14 AM  Result Value Ref Range Status   Source - RVPAN NASAL SWAB  Corrected   Respiratory Syncytial Virus A Negative Negative Final   Respiratory Syncytial Virus B Negative Negative Final   Influenza A Negative Negative Final   Influenza B Negative Negative Final    Parainfluenza 1 Negative Negative Final   Parainfluenza 2 Negative Negative Final   Parainfluenza 3 Negative Negative Final   Metapneumovirus Negative Negative Final   Rhinovirus Negative Negative Final   Adenovirus Negative Negative Final    Comment: (NOTE) Performed At: [facility] [address] [place], [place] [id number] Lindon Romp MD QQ:[id number]   Culture, blood (x 2)     Status: None (Preliminary result)   Collection Time: 12/18/14  9:04 AM  Result Value Ref Range Status   Specimen Description BLOOD RIGHT HAND  Final   Special Requests BOTTLES DRAWN AEROBIC ONLY 3CC  Final   Culture NO GROWTH 4 DAYS  Final   Report Status PENDING  Incomplete  Culture, blood (x 2)     Status: None (Preliminary result)   Collection Time: 12/18/14  9:13 AM  Result Value Ref Range Status   Specimen Description BLOOD RIGHT WRIST  Final   Special Requests BOTTLES DRAWN AEROBIC ONLY 3CC  Final   Culture NO GROWTH 4 DAYS  Final   Report Status PENDING  Incomplete  Culture, expectorated sputum-assessment     Status: None   Collection Time: 12/20/14  9:24 AM  Result Value Ref Range Status   Specimen Description SPU  Final   Special Requests Immunocompromised  Final   Sputum evaluation   Final    THIS SPECIMEN IS ACCEPTABLE. RESPIRATORY CULTURE REPORT TO FOLLOW.   Report Status 12/20/2014 FINAL  Final  Pneumocystis smear by DFA     Status: None   Collection Time: 12/20/14  9:24 AM  Result Value Ref Range Status   Specimen Source-PJSRC NONE  Final   Pneumocystis jiroveci  Ag NEGATIVE  Final    Comment: PERFORMED AT [place]  Culture, respiratory (NON-Expectorated)     Status: None   Collection Time: 12/20/14  9:25 AM  Result Value Ref Range Status   Specimen Description SPUTUM  Final   Special Requests NONE  Final   Gram Stain   Final    THIS SPECIMEN IS ACCEPTABLE FOR SPUTUM CULTURE ABUNDANT WBC PRESENT, PREDOMINANTLY PMN FEW SQUAMOUS EPITHELIAL CELLS  PRESENT MODERATE GRAM POSITIVE COCCI IN PAIRS IN CHAINS IN CLUSTERS FEW GRAM NEGATIVE COCCI    Culture   Final    NORMAL OROPHARYNGEAL FLORA Performed at [facility]    Report Status 12/22/2014 FINAL  Final     Labs: Basic Metabolic Panel:  Recent Labs Lab 12/17/14 0825 12/18/14 0904 12/19/14 0322 12/20/14 0316 12/21/14 0414 12/22/14 0545  NA 138 139 137 136 137 140  K 4.0 4.2 4.0 4.2 4.3 4.7  CL 101 102 102 99* 101 101  CO2 $Re'24 26 27 29 27 29  'DvN$ GLUCOSE 99 115* 120* 130* 89 90  BUN 30* 17 30* 16 34* 23*  CREATININE 5.91* 4.30* 5.09* 3.51* 5.44* 3.64*  CALCIUM 8.3* 8.3* 8.5* 8.5* 8.7* 9.2  PHOS 4.6  --   --   --   --   --    Liver Function Tests:  Recent Labs Lab 12/17/14 0825 12/18/14 0904  AST  --  13*  ALT  --  17  ALKPHOS  --  75  BILITOT  --  0.4  PROT  --  5.7*  ALBUMIN 2.7* 2.5*   No results for input(s): LIPASE, AMYLASE in the last 168 hours. No results for input(s): AMMONIA in the last 168 hours. CBC:  Recent Labs Lab 12/18/14 0904 12/19/14 0322 12/20/14 0316 12/21/14 0414 12/22/14 0545  WBC 8.7 6.2 4.2 3.3* 3.7*  NEUTROABS 7.6  --   --   --   --   HGB 8.3* 8.0* 7.9* 8.5* 8.7*  HCT 26.3* 25.8* 25.9* 26.6* 27.5*  MCV 102.3* 103.2* 101.6* 102.3* 102.2*  PLT 180 192 210 216 252   Cardiac Enzymes: No results for input(s): CKTOTAL, CKMB, CKMBINDEX, TROPONINI in the last 168 hours. BNP: BNP (last 3 results) No results for input(s): BNP in the last 8760 hours.  ProBNP (last 3 results) No results for input(s): PROBNP in the last 8760 hours.  CBG: No results for input(s): GLUCAP in the last 168 hours.  Time coordinating discharge: 35 minutes  Signed:  Makarios Madlock  [facility] 12/22/2014, 8:04 PM

## 2014-12-22 NOTE — Progress Notes (Signed)
[facility] ROUNDING NOTE   Subjective:   Interval History: Improved this morning   Objective:  Vital signs in last 24 hours:  Temp:  [98 F (36.7 C)-99.1 F (37.3 C)] 99.1 F (37.3 C) (12/06 1226) Pulse Rate:  [92-116] 92 (12/06 1226) Resp:  [18] 18 (12/06 1226) BP: (124-133)/(69-98) 132/80 mmHg (12/06 1226) SpO2:  [98 %-100 %] 98 % (12/06 1226) Weight:  [85.2 kg (187 lb 13.3 oz)] 85.2 kg (187 lb 13.3 oz) (12/06 0530)  Weight change: 0.233 kg (8.2 oz) Filed Weights   12/21/14 0655 12/21/14 1105 12/22/14 0530  Weight: 84.5 kg (186 lb 4.6 oz) 85.6 kg (188 lb 11.4 oz) 85.2 kg (187 lb 13.3 oz)    Intake/Output: I/O last 3 completed shifts: In: 480 [P.O.:480] Out: 0    Intake/Output this shift:  Total I/O In: -  Out: 150 [Urine:150]  CVS- RRR RS- CTA ABD- BS present soft non-distended EXT- no edema Left upper arm AVG    Basic Metabolic Panel:  Recent Labs Lab 12/17/14 0825 12/18/14 0904 12/19/14 0322 12/20/14 0316 12/21/14 0414 12/22/14 0545  NA 138 139 137 136 137 140  K 4.0 4.2 4.0 4.2 4.3 4.7  CL 101 102 102 99* 101 101  CO2 24 26 27 29 27 29   GLUCOSE 99 115* 120* 130* 89 90  BUN 30* 17 30* 16 34* 23*  CREATININE 5.91* 4.30* 5.09* 3.51* 5.44* 3.64*  CALCIUM 8.3* 8.3* 8.5* 8.5* 8.7* 9.2  PHOS 4.6  --   --   --   --   --     Liver Function Tests:  Recent Labs Lab 12/15/14 1851 12/17/14 0825 12/18/14 0904  AST 19  --  13*  ALT 36  --  17  ALKPHOS 97  --  75  BILITOT 0.5  --  0.4  PROT 6.2*  --  5.7*  ALBUMIN 3.2* 2.7* 2.5*   No results for input(s): LIPASE, AMYLASE in the last 168 hours. No results for input(s): AMMONIA in the last 168 hours.  CBC:  Recent Labs Lab 12/15/14 1851  12/18/14 0904 12/19/14 0322 12/20/14 0316 12/21/14 0414 12/22/14 0545  WBC 8.1  < > 8.7 6.2 4.2 3.3* 3.7*  NEUTROABS 7.1  --  7.6  --   --   --   --   HGB 9.9*  < > 8.3* 8.0* 7.9* 8.5* 8.7*  HCT 31.2*  < > 26.3* 25.8* 25.9* 26.6* 27.5*   MCV 100.3*  < > 102.3* 103.2* 101.6* 102.3* 102.2*  PLT 192  < > 180 192 210 216 252  < > = values in this interval not displayed.  Cardiac Enzymes: No results for input(s): CKTOTAL, CKMB, CKMBINDEX, TROPONINI in the last 168 hours.  BNP: Invalid input(s): POCBNP  CBG: No results for input(s): GLUCAP in the last 168 hours.  Microbiology: Results for orders placed or performed during the hospital encounter of 12/15/14  Rapid strep screen     Status: None   Collection Time: 12/15/14  6:35 PM  Result Value Ref Range Status   Streptococcus, Group A Screen (Direct) NEGATIVE NEGATIVE Final    Comment: (NOTE) A Rapid Antigen test may result negative if the antigen level in the sample is below the detection level of this test. The FDA has not cleared this test as a stand-alone test therefore the rapid antigen negative result has reflexed to a Group A Strep culture.   Culture, Group A Strep     Status: None  Collection Time: 12/15/14  6:35 PM  Result Value Ref Range Status   Strep A Culture Negative  Final    Comment: (NOTE) Performed At: [facility] [address] [place], [place] [id number] Lindon Romp MD [id number]   Culture, blood (routine x 2)     Status: None   Collection Time: 12/15/14  6:44 PM  Result Value Ref Range Status   Specimen Description BLOOD RIGHT HAND  Final   Special Requests BOTTLES DRAWN AEROBIC AND ANAEROBIC 5CC  Final   Culture NO GROWTH 5 DAYS  Final   Report Status 12/20/2014 FINAL  Final  Culture, Urine     Status: None   Collection Time: 12/17/14  3:50 PM  Result Value Ref Range Status   Specimen Description URINE, CLEAN CATCH  Final   Special Requests NONE  Final   Culture >=100,000 COLONIES/mL KLEBSIELLA PNEUMONIAE  Final   Report Status 12/19/2014 FINAL  Final   Organism ID, Bacteria KLEBSIELLA PNEUMONIAE  Final      Susceptibility   Klebsiella pneumoniae - MIC*    AMPICILLIN >=32 RESISTANT Resistant     CEFAZOLIN <=4  SENSITIVE Sensitive     CEFTRIAXONE <=1 SENSITIVE Sensitive     CIPROFLOXACIN <=0.25 SENSITIVE Sensitive     GENTAMICIN <=1 SENSITIVE Sensitive     IMIPENEM <=0.25 SENSITIVE Sensitive     NITROFURANTOIN 64 INTERMEDIATE Intermediate     TRIMETH/SULFA >=320 RESISTANT Resistant     AMPICILLIN/SULBACTAM 8 SENSITIVE Sensitive     PIP/TAZO <=4 SENSITIVE Sensitive     * >=100,000 COLONIES/mL KLEBSIELLA PNEUMONIAE  Respiratory virus panel     Status: None   Collection Time: 12/18/14  5:14 AM  Result Value Ref Range Status   Source - RVPAN NASAL SWAB  Corrected   Respiratory Syncytial Virus A Negative Negative Final   Respiratory Syncytial Virus B Negative Negative Final   Influenza A Negative Negative Final   Influenza B Negative Negative Final   Parainfluenza 1 Negative Negative Final   Parainfluenza 2 Negative Negative Final   Parainfluenza 3 Negative Negative Final   Metapneumovirus Negative Negative Final   Rhinovirus Negative Negative Final   Adenovirus Negative Negative Final    Comment: (NOTE) Performed At: [facility] [address] [place], [place] [id number] Lindon Romp MD [id number]   Culture, blood (x 2)     Status: None (Preliminary result)   Collection Time: 12/18/14  9:04 AM  Result Value Ref Range Status   Specimen Description BLOOD RIGHT HAND  Final   Special Requests BOTTLES DRAWN AEROBIC ONLY 3CC  Final   Culture NO GROWTH 4 DAYS  Final   Report Status PENDING  Incomplete  Culture, blood (x 2)     Status: None (Preliminary result)   Collection Time: 12/18/14  9:13 AM  Result Value Ref Range Status   Specimen Description BLOOD RIGHT WRIST  Final   Special Requests BOTTLES DRAWN AEROBIC ONLY 3CC  Final   Culture NO GROWTH 4 DAYS  Final   Report Status PENDING  Incomplete  Culture, expectorated sputum-assessment     Status: None   Collection Time: 12/20/14  9:24 AM  Result Value Ref Range Status   Specimen Description SPU  Final   Special  Requests Immunocompromised  Final   Sputum evaluation   Final    THIS SPECIMEN IS ACCEPTABLE. RESPIRATORY CULTURE REPORT TO FOLLOW.   Report Status 12/20/2014 FINAL  Final  Pneumocystis smear by DFA     Status:  None   Collection Time: 12/20/14  9:24 AM  Result Value Ref Range Status   Specimen Source-PJSRC NONE  Final   Pneumocystis jiroveci Ag NEGATIVE  Final    Comment: PERFORMED AT [facility] [facility]  Culture, respiratory (NON-Expectorated)     Status: None   Collection Time: 12/20/14  9:25 AM  Result Value Ref Range Status   Specimen Description SPUTUM  Final   Special Requests NONE  Final   Gram Stain   Final    THIS SPECIMEN IS ACCEPTABLE FOR SPUTUM CULTURE ABUNDANT WBC PRESENT, PREDOMINANTLY PMN FEW SQUAMOUS EPITHELIAL CELLS PRESENT MODERATE GRAM POSITIVE COCCI IN PAIRS IN CHAINS IN CLUSTERS FEW GRAM NEGATIVE COCCI    Culture   Final    NORMAL OROPHARYNGEAL FLORA Performed at [facility]    Report Status 12/22/2014 FINAL  Final    Coagulation Studies: No results for input(s): LABPROT, INR in the last 72 hours.  Urinalysis: No results for input(s): COLORURINE, LABSPEC, PHURINE, GLUCOSEU, HGBUR, BILIRUBINUR, KETONESUR, PROTEINUR, UROBILINOGEN, NITRITE, LEUKOCYTESUR in the last 72 hours.  Invalid input(s): APPERANCEUR    Imaging: Nm Pulmonary Perf And Vent  12/21/2014  CLINICAL DATA:  Tachycardia.  Elevated D-dimer and dyspnea. EXAM: NUCLEAR MEDICINE VENTILATION - PERFUSION LUNG SCAN TECHNIQUE: Ventilation images were obtained in multiple projections using inhaled aerosol Tc-67m DTPA. Perfusion images were obtained in multiple projections after intravenous injection of Tc-7m MAA. RADIOPHARMACEUTICALS:  32.2 Technetium-57m DTPA aerosol inhalation and 4.3 Technetium-66m MAA IV COMPARISON:  Chest radiograph dated 12/18/2014 FINDINGS: Ventilation: There is a single moderate to large segmental defect in the right mid thorax. Perfusion: There is a single  moderate to large segmental defect in the right mid thorax. IMPRESSION: Single matched moderate to large ventilation-profusion segmental defect in the right mid thorax. Low probability, 10-19%, of pulmonary embolus. Electronically Signed   By: Fidela Salisbury M.D.   On: 12/21/2014 13:55   US Abdomen Limited Ruq  12/21/2014  CLINICAL DATA:  Possible liver mass on echo EXAM: US ABDOMEN LIMITED - RIGHT UPPER QUADRANT COMPARISON:  MRI abdomen dated 09/16/2013 FINDINGS: Gallbladder: Surgically absent. Common bile duct: Diameter: 2 mm Liver: Appearance of the liver with increased echogenicity of the portal triads. However, no focal mass is seen. Portal vein is patent. IMPRESSION: Abnormal hepatic parenchymal echogenicity, as described above. Differential considerations include periportal edema/hepatic congestion, parenchymal fibrosis related to cirrhosis, or less likely cholangitis or hepatitis. However, no focal hepatic mass is seen. Correlate with LFTs and consider CT or MRI for further evaluation. Given that patient is on dialysis, liver protocol CT abdomen with/without contrast is suggested. MR, if performed, could only be performed without contrast. Electronically Signed   By: Julian Hy M.D.   On: 12/21/2014 20:45     Medications:     . aspirin EC  81 mg Oral Daily  . cefTAZidime (FORTAZ)  IV  2 g Intravenous Q M,W,F-HD  . darbepoetin (ARANESP) injection - DIALYSIS  150 mcg Intravenous Q Mon-HD  . fluconazole  200 mg Oral Daily  . magic mouthwash w/lidocaine  5 mL Oral QID  . pantoprazole  40 mg Oral Daily  . predniSONE  5 mg Oral Q breakfast  . sodium chloride  3 mL Intravenous Q12H  . tacrolimus  2 mg Oral BID  . zolpidem  5 mg Oral Once   acetaminophen, menthol-cetylpyridinium, polyethylene glycol, technetium TC 39M diethylenetriame-pentaacetic acid  Assessment/ Plan:  Outpatient dialysis ::: MonWedFri, 4 hrs 0 min, 180NRe Optiflux, BFR 400, DFR  Autoflow 1.5, EDW 85.5 (kg),  Dialysate 2.0 K, 2.25 Ca, UFR Profile: None, Sodium Model: None, Access: LUA AV Graft Heparin: 4000 units per treatment Venofer: 50 mg IV weekly (12/11/14) Darbepoietin started 126mcg   1. ESRD MWF HD 2. 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 HTN / Volume continues on dialysis To challenge EDW 5. Anemia Hb 8.5 darbepoietin 182mcg q week started  Information about continuing antibiotics conveyed to PA [facility]   LOS: 4 Geordan Xu W @TODAY @12 :52 PM

## 2014-12-23 LAB — CULTURE, BLOOD (ROUTINE X 2)
Culture: NO GROWTH
Culture: NO GROWTH

## 2014-12-28 LAB — COXSACKIE A VIRUS ANTIBODIES

## 2014-12-30 LAB — MISC LABCORP TEST (SEND OUT): LABCORP TEST CODE: 823361

## 2015-05-11 ENCOUNTER — Ambulatory Visit (INDEPENDENT_AMBULATORY_CARE_PROVIDER_SITE_OTHER): Payer: 59 | Admitting: Cardiovascular Disease

## 2015-05-11 ENCOUNTER — Encounter: Payer: Self-pay | Admitting: Cardiovascular Disease

## 2015-05-11 VITALS — BP 94/64 | HR 90 | Ht 61.0 in | Wt 193.0 lb

## 2015-05-11 DIAGNOSIS — I10 Essential (primary) hypertension: Secondary | ICD-10-CM

## 2015-05-11 NOTE — Patient Instructions (Signed)
Medication Instructions:  Your physician recommends that you continue on your current medications as directed. Please refer to the Current Medication list given to you today.   Labwork: none  Testing/Procedures: none  Follow-Up: Your physician wants you to follow-up in: 1 year with Dr Gwenlyn Found. You will receive a reminder letter in the mail two months in advance. If you don't receive a letter, please call our office to schedule the follow-up appointment.   Any Other Special Instructions Will Be Listed Below (If Applicable).     If you need a refill on your cardiac medications before your next appointment, please call your pharmacy.

## 2015-05-11 NOTE — Assessment & Plan Note (Signed)
History of chronic diastolic heart failure with recent echoperformed 12/20/14 revealing normal LV systolic function with grade 2 diastolic dysfunction. There was a small circumferential pericardial effusion as well.

## 2015-05-11 NOTE — Progress Notes (Signed)
05/11/2015 Oleh Genin   02-24-1955  ST:[id number]  Primary Physician Elyn Peers, MD Primary Cardiologist: Lorretta Harp MD Renae Gloss   HPI:  Ms. Schmidbauer is a 60 year old married African American female mother of 2, grandmother to 2 grandchildren who currently works as an Sales executive. I saw her 6 years ago in our office for evaluation of a fast heart rate which was worked up and ultimately resolved. I last saw her in the office /12/16.Her cardiac risk factor profile is notable for hypertension but otherwise are negative. For the last 3 weeks prior to her last office visit she had noticed substernal chest pain radiating to both upper extremities occurring on a daily basis. She does have reflux but she says the symptoms are different. I ordered a Myoview stress test which was entirely normal. Since I saw her in the office a year ago her chest pain has resolved. She has gone on hemodialysis one year ago. 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  Current Outpatient Prescriptions  Medication Sig Dispense Refill  . acetaminophen (TYLENOL) 500 MG tablet Take 500 mg by mouth every 6 (six) hours as needed for mild pain, moderate pain or headache.     Marland Kitchen aspirin 81 MG tablet Take 81 mg by mouth daily.    . Darbepoetin Alfa-Albumin (ARANESP IJ) Inject as directed every 30 (thirty) days.    Marland Kitchen ethyl chloride spray Apply 1 application topically 3 (three) times a week. Use on arms and legs on Monday Wednesday and fridays  99  . Fe Fum-FePoly-Vit C-Vit B3 (INTEGRA PO) Take 1 tablet by mouth daily.     Marland Kitchen FeFum-FePoly-FA-B Cmp-C-Biot (INTEGRA PLUS) CAPS Take 1 capsule by mouth daily.  6  . magic mouthwash w/lidocaine SOLN Take 5 mLs by mouth 4 (four) times daily. 200 mL 0  . multivitamin (RENA-VIT) TABS tablet Take 1 tablet by mouth daily.    . pantoprazole (PROTONIX) 40 MG tablet TAKE 1 TABLET (40 MG TOTAL) BY MOUTH DAILY. 30 tablet 9  . polyethylene glycol (MIRALAX / GLYCOLAX)  packet Take 17 g by mouth daily as needed for mild constipation.    . predniSONE (DELTASONE) 5 MG tablet Take 1 tablet by mouth daily.  3  . SENSIPAR 30 MG tablet Take 30 mg by mouth daily.  6  . sevelamer carbonate (RENVELA) 800 MG tablet Take 800 mg by mouth 3 (three) times daily with meals.    . tacrolimus (PROGRAF) 1 MG capsule Take 2 mg by mouth 2 (two) times daily.   10   No current facility-administered medications for this visit.    No Known Allergies  Social History   Social History  . Marital Status: Married    Spouse Name: N/A  . Number of Children: 2  . Years of Education: N/A   Occupational History  . [employer]   Social History Main Topics  . Smoking status: Never Smoker   . Smokeless tobacco: Never Used  . Alcohol Use: No  . Drug Use: No  . Sexual Activity: Yes    Birth Control/ Protection: None     Comment: Hysterectomy   Other Topics Concern  . Not on file   Social History Narrative     Review of Systems: General: negative for chills, fever, night sweats or weight changes.  Cardiovascular: negative for chest pain, dyspnea on exertion, edema, orthopnea, palpitations, paroxysmal nocturnal dyspnea or shortness of breath Dermatological: negative for rash Respiratory: negative for cough  or wheezing Urologic: negative for hematuria Abdominal: negative for nausea, vomiting, diarrhea, bright red blood per rectum, melena, or hematemesis Neurologic: negative for visual changes, syncope, or dizziness All other systems reviewed and are otherwise negative except as noted above.    Blood pressure 94/64, pulse 90, height 5\' 1"  (1.549 m), weight 193 lb (87.544 kg).  General appearance: alert and no distress Neck: no adenopathy, no carotid bruit, no JVD, supple, symmetrical, trachea midline and thyroid not enlarged, symmetric, no tenderness/mass/nodules Lungs: clear to auscultation bilaterally Heart: regular rate and rhythm, S1, S2 normal, no  murmur, click, rub or gallop Extremities: extremities normal, atraumatic, no cyanosis or edema  EKG not performed today  ASSESSMENT AND PLAN:   Essential hypertension istory of hypertension blood pressure measured at 94/64. She is currently not on any antihypertensive medications.  Acute on chronic diastolic heart failure History of chronic diastolic heart failure with recent echoperformed 12/20/14 revealing normal LV systolic function with grade 2 diastolic dysfunction. There was a small circumferential pericardial effusion as well.      Lorretta Harp MD FACP,FACC,FAHA, [facility] 05/11/2015 3:29 PM

## 2015-05-11 NOTE — Assessment & Plan Note (Signed)
istory of hypertension blood pressure measured at 94/64. She is currently not on any antihypertensive medications.

## 2015-06-07 ENCOUNTER — Emergency Department (HOSPITAL_COMMUNITY): Payer: 59

## 2015-06-07 ENCOUNTER — Encounter (HOSPITAL_COMMUNITY): Payer: Self-pay

## 2015-06-07 ENCOUNTER — Observation Stay (HOSPITAL_COMMUNITY)
Admission: EM | Admit: 2015-06-07 | Discharge: 2015-06-08 | Disposition: A | Discharge status: Home / Self Care | Payer: 59 | Attending: Internal Medicine | Admitting: Internal Medicine

## 2015-06-07 DIAGNOSIS — T8612 Kidney transplant failure: Secondary | ICD-10-CM | POA: Diagnosis not present

## 2015-06-07 DIAGNOSIS — Z7982 Long term (current) use of aspirin: Secondary | ICD-10-CM | POA: Insufficient documentation

## 2015-06-07 DIAGNOSIS — K219 Gastro-esophageal reflux disease without esophagitis: Secondary | ICD-10-CM | POA: Diagnosis not present

## 2015-06-07 DIAGNOSIS — I5032 Chronic diastolic (congestive) heart failure: Secondary | ICD-10-CM | POA: Diagnosis not present

## 2015-06-07 DIAGNOSIS — I130 Hypertensive heart and chronic kidney disease with heart failure and stage 1 through stage 4 chronic kidney disease, or unspecified chronic kidney disease: Principal | ICD-10-CM | POA: Insufficient documentation

## 2015-06-07 DIAGNOSIS — R7989 Other specified abnormal findings of blood chemistry: Secondary | ICD-10-CM | POA: Diagnosis not present

## 2015-06-07 DIAGNOSIS — R778 Other specified abnormalities of plasma proteins: Secondary | ICD-10-CM | POA: Diagnosis present

## 2015-06-07 DIAGNOSIS — N186 End stage renal disease: Secondary | ICD-10-CM | POA: Diagnosis not present

## 2015-06-07 DIAGNOSIS — Y838 Other surgical procedures as the cause of abnormal reaction of the patient, or of later complication, without mention of misadventure at the time of the procedure: Secondary | ICD-10-CM | POA: Diagnosis not present

## 2015-06-07 DIAGNOSIS — Z940 Kidney transplant status: Secondary | ICD-10-CM | POA: Insufficient documentation

## 2015-06-07 DIAGNOSIS — I10 Essential (primary) hypertension: Secondary | ICD-10-CM

## 2015-06-07 DIAGNOSIS — Z992 Dependence on renal dialysis: Secondary | ICD-10-CM | POA: Diagnosis not present

## 2015-06-07 DIAGNOSIS — Z87898 Personal history of other specified conditions: Secondary | ICD-10-CM | POA: Diagnosis present

## 2015-06-07 DIAGNOSIS — R072 Precordial pain: Secondary | ICD-10-CM

## 2015-06-07 DIAGNOSIS — I480 Paroxysmal atrial fibrillation: Secondary | ICD-10-CM | POA: Insufficient documentation

## 2015-06-07 DIAGNOSIS — R079 Chest pain, unspecified: Secondary | ICD-10-CM

## 2015-06-07 LAB — COMPREHENSIVE METABOLIC PANEL
ALK PHOS: 97 U/L (ref 38–126)
ALT: 22 U/L (ref 14–54)
ANION GAP: 9 (ref 5–15)
AST: 19 U/L (ref 15–41)
Albumin: 3.4 g/dL — ABNORMAL LOW (ref 3.5–5.0)
BUN: 19 mg/dL (ref 6–20)
CALCIUM: 8.8 mg/dL — AB (ref 8.9–10.3)
CO2: 30 mmol/L (ref 22–32)
CREATININE: 4.13 mg/dL — AB (ref 0.44–1.00)
Chloride: 100 mmol/L — ABNORMAL LOW (ref 101–111)
GFR, EST AFRICAN AMERICAN: 13 mL/min — AB (ref >60)
GFR, EST NON AFRICAN AMERICAN: 11 mL/min — AB (ref >60)
Glucose, Bld: 139 mg/dL — ABNORMAL HIGH (ref 65–99)
Potassium: 4 mmol/L (ref 3.5–5.1)
Sodium: 139 mmol/L (ref 135–145)
TOTAL PROTEIN: 6.6 g/dL (ref 6.5–8.1)
Total Bilirubin: 0.3 mg/dL (ref 0.3–1.2)

## 2015-06-07 LAB — CBC
HEMATOCRIT: 33.7 % — AB (ref 36.0–46.0)
Hemoglobin: 10.8 g/dL — ABNORMAL LOW (ref 12.0–15.0)
MCH: 32.4 pg (ref 26.0–34.0)
MCHC: 32 g/dL (ref 30.0–36.0)
MCV: 101.2 fL — AB (ref 78.0–100.0)
Platelets: 219 10*3/uL (ref 150–400)
RBC: 3.33 MIL/uL — AB (ref 3.87–5.11)
RDW: 14.7 % (ref 11.5–15.5)
WBC: 5.3 10*3/uL (ref 4.0–10.5)

## 2015-06-07 LAB — TROPONIN I
TROPONIN I: 0.03 ng/mL (ref <0.031)
TROPONIN I: 0.04 ng/mL — AB (ref <0.031)
Troponin I: <0.03 ng/mL (ref <0.031)

## 2015-06-07 MED ORDER — HEPARIN BOLUS VIA INFUSION
4000.0000 [IU] | Freq: Once | INTRAVENOUS | Status: AC
  Administered 2015-06-07: 4000 [IU] via INTRAVENOUS
  Filled 2015-06-07: qty 4000

## 2015-06-07 MED ORDER — NITROGLYCERIN 2 % TD OINT
1.0000 [in_us] | TOPICAL_OINTMENT | Freq: Four times a day (QID) | TRANSDERMAL | Status: DC
  Administered 2015-06-08 (×4): 1 [in_us] via TOPICAL
  Filled 2015-06-07: qty 30
  Filled 2015-06-07: qty 1

## 2015-06-07 MED ORDER — PANTOPRAZOLE SODIUM 40 MG PO TBEC
40.0000 mg | DELAYED_RELEASE_TABLET | Freq: Every day | ORAL | Status: DC
  Administered 2015-06-08: 40 mg via ORAL
  Filled 2015-06-07: qty 1

## 2015-06-07 MED ORDER — PREDNISONE 5 MG PO TABS
5.0000 mg | ORAL_TABLET | Freq: Every day | ORAL | Status: DC
  Administered 2015-06-08: 5 mg via ORAL
  Filled 2015-06-07: qty 1

## 2015-06-07 MED ORDER — RENA-VITE PO TABS
1.0000 | ORAL_TABLET | Freq: Every day | ORAL | Status: DC
  Administered 2015-06-07: 1 via ORAL
  Filled 2015-06-07: qty 1

## 2015-06-07 MED ORDER — CINACALCET HCL 30 MG PO TABS
30.0000 mg | ORAL_TABLET | Freq: Every day | ORAL | Status: DC
  Administered 2015-06-08 (×2): 30 mg via ORAL
  Filled 2015-06-07 (×2): qty 1

## 2015-06-07 MED ORDER — HEPARIN (PORCINE) IN NACL 100-0.45 UNIT/ML-% IJ SOLN
800.0000 [IU]/h | INTRAMUSCULAR | Status: DC
  Administered 2015-06-07: 950 [IU]/h via INTRAVENOUS
  Filled 2015-06-07: qty 250

## 2015-06-07 MED ORDER — ACETAMINOPHEN 325 MG PO TABS: 650.0000 mg | ORAL_TABLET | ORAL | Status: DC | PRN

## 2015-06-07 MED ORDER — ASPIRIN EC 325 MG PO TBEC
325.0000 mg | DELAYED_RELEASE_TABLET | Freq: Once | ORAL | Status: AC
  Administered 2015-06-07: 325 mg via ORAL
  Filled 2015-06-07: qty 1

## 2015-06-07 MED ORDER — FE FUMARATE-B12-VIT C-FA-IFC PO CAPS
1.0000 | ORAL_CAPSULE | Freq: Every day | ORAL | Status: DC
  Administered 2015-06-08: 1 via ORAL
  Filled 2015-06-07: qty 1

## 2015-06-07 MED ORDER — ONDANSETRON HCL 4 MG/2ML IJ SOLN: 4.0000 mg | Freq: Four times a day (QID) | INTRAMUSCULAR | Status: DC | PRN

## 2015-06-07 MED ORDER — INTEGRA PLUS PO CAPS: 1.0000 | ORAL_CAPSULE | ORAL | Status: DC

## 2015-06-07 MED ORDER — SEVELAMER CARBONATE 800 MG PO TABS
800.0000 mg | ORAL_TABLET | Freq: Three times a day (TID) | ORAL | Status: DC
  Administered 2015-06-08 (×2): 800 mg via ORAL
  Filled 2015-06-07 (×2): qty 1

## 2015-06-07 MED ORDER — METOPROLOL TARTRATE 12.5 MG HALF TABLET
12.5000 mg | ORAL_TABLET | Freq: Two times a day (BID) | ORAL | Status: DC
  Administered 2015-06-07 – 2015-06-08 (×2): 12.5 mg via ORAL
  Filled 2015-06-07 (×2): qty 1

## 2015-06-07 MED ORDER — TACROLIMUS 1 MG PO CAPS
2.0000 mg | ORAL_CAPSULE | Freq: Two times a day (BID) | ORAL | Status: DC
  Administered 2015-06-07 – 2015-06-08 (×2): 2 mg via ORAL
  Filled 2015-06-07 (×2): qty 2

## 2015-06-07 MED ORDER — ASPIRIN 81 MG PO CHEW
81.0000 mg | CHEWABLE_TABLET | Freq: Every day | ORAL | Status: DC
  Administered 2015-06-08: 81 mg via ORAL
  Filled 2015-06-07: qty 1

## 2015-06-07 NOTE — H&P (Signed)
History and Physical    Karen [name] [id number] DOB: 1955-03-18 DOA: 06/07/2015  PCP: Elyn Peers, MD  Nephrology: Dr. Arty Baumgartner ([facility]) Renal Transplant Center: [facility] Cardiologist: Dr. Quay Burow  Patient coming from: Dialysis unit  Chief Complaint: Chest pain and palpitations  HPI: Karen [name] is a 60 y.o. woman with a history of ESRD on HD S/P failed kidney transplant, HTN, diastolic heart failure, and GERD who feels that she was in her baseline state of health when she presented for HD today.  Approximately 1.5 hours into her session today, she developed chest pressure and palpitations.  The intensity of her symptoms waxed and waned over the next hour.  She was put on oxygen but did not receive any additional medications.  Her HD session was ultimately aborted due to erratic heart rates and reportedly rhythm strips there showed atrial fibrillation.  The patient denies any associated shortness of breath, diaphoresis, light-headedness, or syncope.  She was transferred to the ED by EMS personnel.  Sinus rhythm has been documented here, and chest pressure (now described as a dull ache) has improved but not completely resolved.    ED Course: She received one full strength aspirin in the ED.  There is a concern for rising troponin (went from undetectable to 0.04).  Hospitalist asked to admit to further evaluation for ACS.  Review of Systems: No personal history of CAD but she has had arrhythmia in the past.  She also has documented diastolic heart failure.  Just saw her cardiologist last month and was told she could follow-up in one year.  Stress test, approximately one year ago, was negative for ischemia.  No recent swelling or weight gain.  No DOE.  Otherwise, 12 systems reviewed and negative except as stated in the HPI.  Past Medical History  Diagnosis Date  . Hypertension   . Chest pain Jan 2016    low risk Myoview   . GERD (gastroesophageal reflux  disease)   . Complication of anesthesia 2005    difficulty remembering for a while  . Heart murmur Nov 2015    Aortic scleosis- no stenosis  . Shortness of breath dyspnea     with exertion  . Constipation   . Arthritis     knees  . Anemia   . ESRD on dialysis [facility]) April 2016    MWF    Past Surgical History  Procedure Laterality Date  . Rotator cuff repair Right 1997  . Cholecystectomy  2005  . Knee arthroscopy Bilateral   . Bunionectomy Bilateral   . Abdominal hysterectomy  2005  . Colonoscopy    . Av fistula placement Left 12/09/2013    Procedure: INSERTION OF ARTERIOVENOUS (AV) GORE-TEX GRAFT ARM;  Surgeon: Elam Dutch, MD;  Location: [place];  Service: Vascular;  Laterality: Left;  . Kidney transplant  July 2016    failed     reports that she has never smoked. She has never used smokeless tobacco. She reports that she does not drink alcohol or use illicit drugs.  She is married.  She has two adult children.  No Known Allergies  Family History  Problem Relation Age of Onset  . Diabetes Sister   . Heart attack Father   . Hyperlipidemia Sister   . Hypertension Sister   . Kidney disease Father   . Kidney disease Mother   . Kidney disease Sister     x2   Prior to Admission medications   Medication Sig Start  Date End Date Taking? Authorizing Provider  acetaminophen (TYLENOL) 500 MG tablet Take 500 mg by mouth every 6 (six) hours as needed for mild pain, moderate pain or headache.    Yes Historical Provider, MD  amoxicillin (AMOXIL) 500 MG capsule Take 2,000 mg by mouth See admin instructions. Only for dental procedures   Yes Historical Provider, MD  aspirin 81 MG tablet Take 81 mg by mouth daily.   Yes Historical Provider, MD  ethyl chloride spray Apply 1 application topically as needed (Before Dialysis).   Yes Historical Provider, MD  FeFum-FePoly-FA-B Cmp-C-Biot (INTEGRA PLUS) CAPS Take 1 capsule by mouth every morning.  04/26/15  Yes Historical Provider, MD    lidocaine-prilocaine (EMLA) cream Apply 1 application topically See admin instructions. Pt to apply cream each day before dialysis, Mon, Wed, Fri 05/26/15  Yes Historical Provider, MD  multivitamin (RENA-VIT) TABS tablet Take 1 tablet by mouth daily.   Yes Historical Provider, MD  pantoprazole (PROTONIX) 40 MG tablet TAKE 1 TABLET (40 MG TOTAL) BY MOUTH DAILY. 07/21/14  Yes Lorretta Harp, MD  polyethylene glycol [facility] / Floria Raveling) packet Take 17 g by mouth daily as needed for mild constipation.   Yes Historical Provider, MD  predniSONE (DELTASONE) 5 MG tablet Take 1 tablet by mouth daily. 10/22/14  Yes Historical Provider, MD  SENSIPAR 30 MG tablet Take 30 mg by mouth every evening.  04/23/15  Yes Historical Provider, MD  sevelamer carbonate (RENVELA) 800 MG tablet Take 800 mg by mouth 3 (three) times daily with meals.   Yes Historical Provider, MD  tacrolimus (PROGRAF) 1 MG capsule Take 2 mg by mouth 2 (two) times daily.  11/14/14  Yes Historical Provider, MD  Darbepoetin Alfa-Albumin (ARANESP IJ) Inject as directed every 30 (thirty) days.    Historical Provider, MD    Physical Exam: Filed Vitals:   06/07/15 2100 06/07/15 2115 06/07/15 2130 06/07/15 2145  BP: 118/74 128/78 128/74 128/78  Pulse: 87 87 87 83  Resp: 18  17 14   Height: 5\' 1"  (1.549 m)     Weight: 88.905 kg (196 lb)     SpO2: 97% 99% 97% 99%      Constitutional: NAD, calm, comfortable Filed Vitals:   06/07/15 2100 06/07/15 2115 06/07/15 2130 06/07/15 2145  BP: 118/74 128/78 128/74 128/78  Pulse: 87 87 87 83  Resp: 18  17 14   Height: 5\' 1"  (1.549 m)     Weight: 88.905 kg (196 lb)     SpO2: 97% 99% 97% 99%   Eyes: PERRL, lids and conjunctivae normal ENMT: Mucous membranes are moist. Posterior pharynx clear of any exudate or lesions.Normal dentition.  Neck: normal, supple, no masses Respiratory: clear to auscultation bilaterally, no wheezing, no crackles. Normal respiratory effort. No accessory muscle use.   Cardiovascular: Regular rate and rhythm, + murmur.  No gallops.  No extremity edema. 2+ pedal pulses.  Abdomen: no tenderness, no masses palpated. Bowel sounds positive.  Musculoskeletal: no clubbing / cyanosis. No joint deformity upper and lower extremities. Good ROM, no contractures. Normal muscle tone. LUE AV fistula noted. Skin: no rashes, lesions, ulcers. No induration Neurologic: CN 2-12 grossly intact. Sensation intact, Strength 5/5 in all 4.  Psychiatric: Normal judgment and insight. Alert and oriented x 3. Normal mood.   Labs on Admission: I have personally reviewed following labs and imaging studies  CBC:  Recent Labs Lab 06/07/15 1616  WBC 5.3  HGB 10.8*  HCT 33.7*  MCV 101.2*  PLT 219  Basic Metabolic Panel:  Recent Labs Lab 06/07/15 1616  NA 139  K 4.0  CL 100*  CO2 30  GLUCOSE 139*  BUN 19  CREATININE 4.13*  CALCIUM 8.8*   GFR: Estimated Creatinine Clearance: 14.7 mL/min (by C-G formula based on Cr of 4.13). Liver Function Tests:  Recent Labs Lab 06/07/15 1616  AST 19  ALT 22  ALKPHOS 97  BILITOT 0.3  PROT 6.6  ALBUMIN 3.4*   Cardiac Enzymes:  Recent Labs Lab 06/07/15 1616 06/07/15 1733 06/07/15 1930  TROPONINI <0.03 0.03 0.04*   Radiological Exams on Admission: Dg Chest 2 View  06/07/2015  CLINICAL DATA:  Chest pain began today after dialysis, more pressure on the right side of chest. Pt states she goes 3x per week for dialysis. EXAM: CHEST  2 VIEW COMPARISON:  12/18/2014 FINDINGS: Degraded lateral view, secondary to positioning. Patient rotated left on the frontal. Cardiomegaly accentuated by AP portable technique. Atherosclerosis in the transverse aorta. Possible small left pleural effusion on the lateral view. No pneumothorax. No congestive failure. Minimal scarring at the left lung base medially. IMPRESSION: No acute cardiopulmonary disease. Cardiomegaly without congestive failure. Possible trace left pleural fluid. Electronically  Signed   By: Abigail Miyamoto M.D.   On: 06/07/2015 17:10   EKG: Independently reviewed. Sinus tachycardia, HR 101.  No ST segment changes greater than 58mm  Assessment/Plan Principal Problem:   Chest pain Active Problems:   End stage renal disease on dialysis [facility])   Essential hypertension   Diastolic dysfunction   Elevated troponin I level   Paroxysmal atrial fibrillation (HCC)  Chest pain concerning for unstable angina with mildly elevated troponin in the setting of ESRD, reported atrial fibrillation (with history of nonspecific arrhythmia) --Admit to telemetry --Serial troponin --Anticoagulate with unfractionated heparin infusion (CHADS-Vasc score is at least three for HTN,CHF, and gender) --Add nitropaste, low dose metoprolol since she is still complaining of mild chest discomfort --Echo in the AM --NPO after 4AM in case repeat stress test is indicated --She will need cardiology consult in the morning --Check TFTs --Check fasting lipid panel  ESRD on HD --[facility] notified of admission (Dr. Mercy Moore is on call).  They will see the patient in the morning.  HTN --Does not appear to be on anti-hypertensives at home.  Nitropaste and BB as noted above while being evaluated for ACS.  History of chronic diastolic heart failure --Appears compensated at this time  History of renal transplant --Continue prednisone, prograf  DVT prophylaxis: Anticoagulated with IV heparin Code Status: FULL Family Communication: Son and daughter-in-law at bedside Disposition Plan: Expect her to go home when cardiac evaluation is complete Consults called: [facility] called tonight; Will need to call cardiology consult in the AM. Admission status: Observation, telemetry   Eber Jones MD [facility]  If 7PM-7AM, please contact night-coverage [email or web address] Password [facility]  06/07/2015, 9:58 PM

## 2015-06-07 NOTE — ED Provider Notes (Signed)
CSN: [id number]     Arrival date & time 06/07/15  1545 History   First MD Initiated Contact with Patient 06/07/15 1547     Chief Complaint  Patient presents with  . Tachycardia   Patient is a 60 y.o. female presenting with palpitations.  Palpitations Palpitations quality:  Fast Onset quality:  Sudden Timing:  Intermittent Progression:  Waxing and waning Chronicity:  New Context: not caffeine, not dehydration, not illicit drugs, not nicotine and not stimulant use   Context comment:  1.5 hrs into HD session. noted chest pain and racing heart rate.  Pulse fluctuating 100-180 Associated symptoms: chest pain and chest pressure   Associated symptoms: no back pain, no cough, no diaphoresis, no dizziness, no hemoptysis, no lower extremity edema, no malaise/fatigue, no nausea, no near-syncope, no numbness, no orthopnea, no PND, no shortness of breath, no syncope, no vomiting and no weakness   Associated symptoms comment:  Diarrhea NB this AM x1.  Makes very little urine.   Risk factors: no diabetes mellitus, no hx of atrial fibrillation and no hx of PE   Risk factors comment:  Hx of pericarditis., ESRD on HD via l AVF, renal transplant not functional, on immunosuppresants  EMS had 3 EKGs, two with sinus tachycardia with p waves, 1 with a fib, no p waves irregular, tachy.   No recent URI sx.. No cough/sputum nor hemoptysis No bloody stools. No hx of A fib per pt.  Last myoperfusion study low risk, 2016.  Followed by cards  Past Medical History  Diagnosis Date  . Hypertension   . Chest pain Jan 2016    low risk Myoview   . GERD (gastroesophageal reflux disease)   . Complication of anesthesia 2005    difficulty remembering for a while  . Heart murmur Nov 2015    Aortic scleosis- no stenosis  . Shortness of breath dyspnea     with exertion  . Constipation   . Arthritis     knees  . Anemia   . ESRD on dialysis [facility]) April 2016    MWF   Past Surgical History  Procedure Laterality Date   . Rotator cuff repair Right 1997  . Cholecystectomy  2005  . Knee arthroscopy Bilateral   . Bunionectomy Bilateral   . Abdominal hysterectomy  2005  . Colonoscopy    . Av fistula placement Left 12/09/2013    Procedure: INSERTION OF ARTERIOVENOUS (AV) GORE-TEX GRAFT ARM;  Surgeon: Elam Dutch, MD;  Location: [place];  Service: Vascular;  Laterality: Left;  . Kidney transplant  July 2016    failed   Family History  Problem Relation Age of Onset  . Diabetes Sister   . Heart attack Father   . Hyperlipidemia Sister   . Hypertension Sister   . Kidney disease Father   . Kidney disease Mother   . Kidney disease Sister     x2   Social History  Substance Use Topics  . Smoking status: Never Smoker   . Smokeless tobacco: Never Used  . Alcohol Use: No   OB History    No data available     Review of Systems  Constitutional: Negative for fever, chills, malaise/fatigue and diaphoresis.  HENT: Negative for congestion.   Eyes: Negative for visual disturbance.  Respiratory: Negative for cough, hemoptysis, shortness of breath and wheezing.   Cardiovascular: Positive for chest pain and palpitations. Negative for orthopnea, syncope, PND and near-syncope.  Gastrointestinal: Negative for nausea, vomiting, abdominal pain and blood  in stool.  Genitourinary: Negative for dysuria, flank pain, enuresis and difficulty urinating.  Musculoskeletal: Negative for back pain and neck pain.  Skin: Negative for pallor and rash.  Neurological: Negative for dizziness, syncope, weakness, light-headedness, numbness and headaches.  Psychiatric/Behavioral: Negative for confusion.  All other systems reviewed and are negative.     Allergies  Review of patient's allergies indicates no known allergies.  Home Medications   Prior to Admission medications   Medication Sig Start Date End Date Taking? Authorizing Provider  acetaminophen (TYLENOL) 500 MG tablet Take 500 mg by mouth every 6 (six) hours as  needed for mild pain, moderate pain or headache.    Yes Historical Provider, MD  amoxicillin (AMOXIL) 500 MG capsule Take 2,000 mg by mouth See admin instructions. Only for dental procedures   Yes Historical Provider, MD  aspirin 81 MG tablet Take 81 mg by mouth daily.   Yes Historical Provider, MD  ethyl chloride spray Apply 1 application topically as needed (Before Dialysis).   Yes Historical Provider, MD  FeFum-FePoly-FA-B Cmp-C-Biot (INTEGRA PLUS) CAPS Take 1 capsule by mouth every morning.  04/26/15  Yes Historical Provider, MD  lidocaine-prilocaine (EMLA) cream Apply 1 application topically See admin instructions. Pt to apply cream each day before dialysis, Mon, Wed, Fri 05/26/15  Yes Historical Provider, MD  multivitamin (RENA-VIT) TABS tablet Take 1 tablet by mouth daily.   Yes Historical Provider, MD  pantoprazole (PROTONIX) 40 MG tablet TAKE 1 TABLET (40 MG TOTAL) BY MOUTH DAILY. 07/21/14  Yes Lorretta Harp, MD  polyethylene glycol [facility] / Floria Raveling) packet Take 17 g by mouth daily as needed for mild constipation.   Yes Historical Provider, MD  predniSONE (DELTASONE) 5 MG tablet Take 1 tablet by mouth daily. 10/22/14  Yes Historical Provider, MD  SENSIPAR 30 MG tablet Take 30 mg by mouth every evening.  04/23/15  Yes Historical Provider, MD  sevelamer carbonate (RENVELA) 800 MG tablet Take 800 mg by mouth 3 (three) times daily with meals.   Yes Historical Provider, MD  tacrolimus (PROGRAF) 1 MG capsule Take 2 mg by mouth 2 (two) times daily.  11/14/14  Yes Historical Provider, MD  Darbepoetin Alfa-Albumin (ARANESP IJ) Inject as directed every 30 (thirty) days.    Historical Provider, MD   BP 127/75 mmHg  Pulse 82  Resp 15  Ht 5\' 1"  (1.549 m)  Wt 88.905 kg  BMI 37.05 kg/m2  SpO2 99% Physical Exam  Constitutional: She is oriented to person, place, and time. She appears well-developed and well-nourished. No distress.  HENT:  Head: Normocephalic and atraumatic.  Nose: Nose normal.   Eyes: Conjunctivae are normal.  No conj pallor  Neck: Normal range of motion. Neck supple. No JVD present. No tracheal deviation present.  Cardiovascular: Regular rhythm and normal heart sounds.   No murmur heard. L AVF with thrill. HR 100, regular on my exam with a few irregular beats on pulse palpation   Pulmonary/Chest: Effort normal and breath sounds normal. No respiratory distress. She has no wheezes. She has no rales. She exhibits no tenderness.  Abdominal: Soft. Bowel sounds are normal. She exhibits no distension and no mass. There is no tenderness.  Musculoskeletal: Normal range of motion. She exhibits no edema or tenderness.  No lower extremity edema, calf tenderness, warmth, erythema or palpable cords   Neurological: She is alert and oriented to person, place, and time.  Skin: Skin is warm and dry. No rash noted.  Psychiatric: She has a normal mood  and affect.  Nursing note and vitals reviewed.   ED Course  Procedures (including critical care time) Labs Review Labs Reviewed  CBC - Abnormal; Notable for the following:    RBC 3.33 (*)    Hemoglobin 10.8 (*)    HCT 33.7 (*)    MCV 101.2 (*)    All other components within normal limits  COMPREHENSIVE METABOLIC PANEL - Abnormal; Notable for the following:    Chloride 100 (*)    Glucose, Bld 139 (*)    Creatinine, Ser 4.13 (*)    Calcium 8.8 (*)    Albumin 3.4 (*)    GFR calc non Af Amer 11 (*)    GFR calc Af Amer 13 (*)    All other components within normal limits  TROPONIN I - Abnormal; Notable for the following:    Troponin I 0.04 (*)    All other components within normal limits  TROPONIN I  TROPONIN I    Imaging Review Dg Chest 2 View  06/07/2015  CLINICAL DATA:  Chest pain began today after dialysis, more pressure on the right side of chest. Pt states she goes 3x per week for dialysis. EXAM: CHEST  2 VIEW COMPARISON:  12/18/2014 FINDINGS: Degraded lateral view, secondary to positioning. Patient rotated left on  the frontal. Cardiomegaly accentuated by AP portable technique. Atherosclerosis in the transverse aorta. Possible small left pleural effusion on the lateral view. No pneumothorax. No congestive failure. Minimal scarring at the left lung base medially. IMPRESSION: No acute cardiopulmonary disease. Cardiomegaly without congestive failure. Possible trace left pleural fluid. Electronically Signed   By: Abigail Miyamoto M.D.   On: 06/07/2015 17:10   I have personally reviewed and evaluated these images and lab results as part of my medical decision-making.   EKG Interpretation   Date/Time:  Monday Jun 07 2015 16:08:18 EDT Ventricular Rate:  101 PR Interval:  164 QRS Duration: 91 QT Interval:  362 QTC Calculation: 469 R Axis:   1 Text Interpretation:  Sinus tachycardia No significant change was found  Confirmed by YAO  MD, DAVID ([id number]) on 06/07/2015 4:11:56 PM      MDM   Final diagnoses:  Chest pain, unspecified chest pain type  Elevated troponin  ESRD (end stage renal disease) on dialysis (HCC)  Paroxysmal atrial fibrillation (HCC)    ESRD on HD. Presents with chest pain. Vital signs stable she is a normal sinus rhythm currently. I reviewed EMS trip systemic history of onset of A. fib. She is approximately A. fib will speak to patient about anticoagulation given her high chads-vASC. Chest x-ray without evidence of consolidation or forward pulmonary edema. She is very well-appearing. I reviewed recent myocardial perfusion study within last 18 months which was without evidence of ischemic disease and deemed to be low risk. EKG without ischemic ST segment changes in comparison to prior. I very low suspicion for aortic dissection given well appearance.  Delta trop positive. Will anticoagulate for ACS concern and pAF. Hospitalist to admit.  CP free at admission.  Remains in NSR.  HR controlled.    Tammy Sours, MD 06/07/15 [id number]  Wandra Arthurs, MD 06/07/15 [phone]

## 2015-06-07 NOTE — ED Notes (Signed)
Pt from dialysis where she had almost completed 4 hour run when she started having irregular tachycardia. Chest pain when hr severely elevated. Chest pain now at 4/10.

## 2015-06-07 NOTE — Progress Notes (Addendum)
ANTICOAGULATION CONSULT NOTE - Initial Consult  Pharmacy Consult for heparin Indication: chest pain/ACS and atrial fibrillation  No Known Allergies  Patient Measurements: Height: 5\' 1"  (154.9 cm) Weight: 196 lb (88.905 kg) IBW/kg (Calculated) : 47.8 Heparin Dosing Weight: 68.5 kg  Vital Signs: BP: 127/75 mmHg (05/22 2015) Pulse Rate: 82 (05/22 2015)  Labs:  Recent Labs  06/07/15 1616 06/07/15 1733 06/07/15 1930  HGB 10.8*  --   --   HCT 33.7*  --   --   PLT 219  --   --   CREATININE 4.13*  --   --   TROPONINI <0.03 0.03 0.04*    Estimated Creatinine Clearance: 14.7 mL/min (by C-G formula based on Cr of 4.13).   Medical History: Past Medical History  Diagnosis Date  . Hypertension   . Chest pain Jan 2016    low risk Myoview   . GERD (gastroesophageal reflux disease)   . Complication of anesthesia 2005    difficulty remembering for a while  . Heart murmur Nov 2015    Aortic scleosis- no stenosis  . Shortness of breath dyspnea     with exertion  . Constipation   . Arthritis     knees  . Anemia   . ESRD on dialysis [facility]) April 2016    MWF    Medications:  Scheduled:   Assessment: 60 yo woman admitted 06/07/2015 for palpitations, also having slightly elevated troponin. Pharmacy consulted to dose heparin.  PMH HTN, ESRD s/p renal transplant (failed 2016), arthritis, anemia, HFpEF (EF 60-65%)  No AC prior to admission, Hgb 10.8 and plt wnl. CHADSVASc at least 3. Also concern for ACS, will therefore bolus and start gtt.   Goal of Therapy:  Heparin level 0.3-0.7 units/ml Monitor platelets by anticoagulation protocol: Yes   Plan:  Give 4000 units bolus x 1 Start heparin infusion at 950 units/hr Continue to monitor H&H and platelets  AM HL and daily HL Monitor s/sx bleeding   Heloise Ochoa, Pharm.D., BCPS PGY2 Cardiology Pharmacy Resident Pager: [phone]  06/07/2015,9:39 PM

## 2015-06-07 NOTE — ED Notes (Signed)
Attempted to call report

## 2015-06-08 ENCOUNTER — Observation Stay (HOSPITAL_COMMUNITY): Payer: 59

## 2015-06-08 ENCOUNTER — Observation Stay (HOSPITAL_BASED_OUTPATIENT_CLINIC_OR_DEPARTMENT_OTHER): Payer: 59

## 2015-06-08 DIAGNOSIS — I10 Essential (primary) hypertension: Secondary | ICD-10-CM | POA: Diagnosis not present

## 2015-06-08 DIAGNOSIS — I480 Paroxysmal atrial fibrillation: Secondary | ICD-10-CM | POA: Diagnosis not present

## 2015-06-08 DIAGNOSIS — I4891 Unspecified atrial fibrillation: Secondary | ICD-10-CM

## 2015-06-08 DIAGNOSIS — R079 Chest pain, unspecified: Secondary | ICD-10-CM | POA: Diagnosis not present

## 2015-06-08 DIAGNOSIS — I130 Hypertensive heart and chronic kidney disease with heart failure and stage 1 through stage 4 chronic kidney disease, or unspecified chronic kidney disease: Secondary | ICD-10-CM | POA: Diagnosis not present

## 2015-06-08 DIAGNOSIS — N186 End stage renal disease: Secondary | ICD-10-CM | POA: Diagnosis not present

## 2015-06-08 LAB — NM MYOCAR MULTI W/SPECT W/WALL MOTION / EF
CHL CUP MPHR: 160 {beats}/min
CHL RATE OF PERCEIVED EXERTION: 0
CSEPED: 0 min
CSEPEDS: 0 s
CSEPEW: 1 METS
Peak HR: 99 {beats}/min
Percent HR: 61 %
Rest HR: 73 {beats}/min

## 2015-06-08 LAB — BASIC METABOLIC PANEL
ANION GAP: 11 (ref 5–15)
BUN: 32 mg/dL — ABNORMAL HIGH (ref 6–20)
CALCIUM: 8.5 mg/dL — AB (ref 8.9–10.3)
CO2: 30 mmol/L (ref 22–32)
Chloride: 100 mmol/L — ABNORMAL LOW (ref 101–111)
Creatinine, Ser: 5.63 mg/dL — ABNORMAL HIGH (ref 0.44–1.00)
GFR, EST AFRICAN AMERICAN: 9 mL/min — AB (ref >60)
GFR, EST NON AFRICAN AMERICAN: 7 mL/min — AB (ref >60)
Glucose, Bld: 76 mg/dL (ref 65–99)
POTASSIUM: 4.3 mmol/L (ref 3.5–5.1)
SODIUM: 141 mmol/L (ref 135–145)

## 2015-06-08 LAB — HEPARIN LEVEL (UNFRACTIONATED)
Heparin Unfractionated: 0.76 IU/mL — ABNORMAL HIGH (ref 0.30–0.70)
Heparin Unfractionated: 0.75 IU/mL — ABNORMAL HIGH (ref 0.30–0.70)

## 2015-06-08 LAB — CBC
HCT: 30.9 % — ABNORMAL LOW (ref 36.0–46.0)
Hemoglobin: 9.8 g/dL — ABNORMAL LOW (ref 12.0–15.0)
MCH: 32.3 pg (ref 26.0–34.0)
MCHC: 31.7 g/dL (ref 30.0–36.0)
MCV: 102 fL — ABNORMAL HIGH (ref 78.0–100.0)
Platelets: 222 10*3/uL (ref 150–400)
RBC: 3.03 MIL/uL — ABNORMAL LOW (ref 3.87–5.11)
RDW: 15.2 % (ref 11.5–15.5)
WBC: 5.2 10*3/uL (ref 4.0–10.5)

## 2015-06-08 LAB — LIPID PANEL
CHOLESTEROL: 165 mg/dL (ref 0–200)
HDL: 59 mg/dL (ref >40)
LDL Cholesterol: 97 mg/dL (ref 0–99)
Total CHOL/HDL Ratio: 2.8 RATIO
Triglycerides: 44 mg/dL (ref <150)
VLDL: 9 mg/dL (ref 0–40)

## 2015-06-08 LAB — ECHOCARDIOGRAM COMPLETE
Height: 61 in
WEIGHTICAEL: 3180.8 [oz_av]

## 2015-06-08 LAB — TROPONIN I
TROPONIN I: 0.03 ng/mL (ref <0.031)
Troponin I: 0.04 ng/mL — ABNORMAL HIGH (ref <0.031)

## 2015-06-08 LAB — T4, FREE: Free T4: 1.14 ng/dL — ABNORMAL HIGH (ref 0.61–1.12)

## 2015-06-08 LAB — TSH: TSH: 0.808 u[IU]/mL (ref 0.350–4.500)

## 2015-06-08 MED ORDER — REGADENOSON 0.4 MG/5ML IV SOLN
0.4000 mg | Freq: Once | INTRAVENOUS | Status: AC
  Administered 2015-06-08: 0.4 mg via INTRAVENOUS
  Filled 2015-06-08: qty 5

## 2015-06-08 MED ORDER — TECHNETIUM TC 99M TETROFOSMIN IV KIT
10.0000 | PACK | Freq: Once | INTRAVENOUS | Status: AC | PRN
  Administered 2015-06-08: 10 via INTRAVENOUS

## 2015-06-08 MED ORDER — TECHNETIUM TC 99M TETROFOSMIN IV KIT
30.0000 | PACK | Freq: Once | INTRAVENOUS | Status: AC | PRN
  Administered 2015-06-08: 30 via INTRAVENOUS

## 2015-06-08 MED ORDER — REGADENOSON 0.4 MG/5ML IV SOLN
INTRAVENOUS | Status: AC
  Administered 2015-06-08: 0.4 mg via INTRAVENOUS
  Filled 2015-06-08: qty 5

## 2015-06-08 NOTE — Discharge Summary (Signed)
Physician Discharge Summary  Karen [name] [id number] DOB: October 13, 1955 DOA: 06/07/2015  PCP: Elyn Peers, MD  Admit date: 06/07/2015 Discharge date: 06/08/2015  Time spent: > 30 minutes  Recommendations for Outpatient Follow-up:  1. Follow up with PCP in 2-4 weeks 2. Follow up with Dr. Gwenlyn Found in 2-4 weeks   Discharge Diagnoses:  Principal Problem:   Chest pain Active Problems:   End stage renal disease on dialysis [facility])   Essential hypertension   Diastolic dysfunction   Elevated troponin I level   Paroxysmal atrial fibrillation [facility])  Discharge Condition: stable  Diet recommendation: renal, heart healthy  Filed Weights   06/07/15 2100 06/07/15 2243 06/08/15 0544  Weight: 88.905 kg (196 lb) 89.903 kg (198 lb 3.2 oz) 90.175 kg (198 lb 12.8 oz)   History of present illness:  Karen [name] is a 60 y.o. woman with a history of ESRD on HD S/P failed kidney transplant, HTN, diastolic heart failure, and GERD who feels that she was in her baseline state of health when she presented for HD today. Approximately 1.5 hours into her session today, she developed chest pressure and palpitations. The intensity of her symptoms waxed and waned over the next hour. She was put on oxygen but did not receive any additional medications. Her HD session was ultimately aborted due to erratic heart rates and reportedly rhythm strips there showed atrial fibrillation. The patient denies any associated shortness of breath, diaphoresis, light-headedness, or syncope. She was transferred to the ED by EMS personnel. Sinus rhythm has been documented here, and chest pressure (now described as a dull ache) has improved but not completely resolved.   Hospital Course:  Chest pain - Patient was admitted to the hospital with chest pain and palpitations. Cardiology was consulted and have followed patient while hospitalized. She underwent a 2D echo which showed normal EF and no WMA. She had a stress test as  well without reversible ischemia and considered low risk. Telemetry without significant arrhythmias. Discussed with cardiology, [place] for discharge home with close outpatient follow up.  ESRD on HD - outpatient HD HTN History of chronic diastolic heart failure - Appears compensated at this time History of renal transplant - Continue prednisone, prograf, is again pursuing a second transplant.  Procedures:  2D echo Study Conclusions - Left ventricle: The cavity size was normal. Wall thickness was normal. Systolic function was normal. The estimated ejection fraction was in the range of 55% to 60%. Wall motion was normal; there were no regional wall motion abnormalities. Left ventricular diastolic function parameters were normal. - Pericardium, extracardiac: A trivial pericardial effusion was identified.   Stress test   Consultations:  Cardiology   Discharge Exam: Filed Vitals:   06/08/15 1207 06/08/15 1227 06/08/15 1229 06/08/15 1346  BP: 106/63 127/70 133/69 127/87  Pulse:    80  Temp:    97.5 F (36.4 C)  TempSrc:    Oral  Resp:    18  Height:      Weight:      SpO2:    98%    General: NAD Cardiovascular: RRR Respiratory: CTA biL  Discharge Instructions Activity:  As tolerated   Get Medicines reviewed and adjusted: Please take all your medications with you for your next visit with your Primary MD  Please request your Primary MD to go over all hospital tests and procedure/radiological results at the follow up, please ask your Primary MD to get all Hospital records sent to his/her office.  If you experience worsening  of your admission symptoms, develop shortness of breath, life threatening emergency, suicidal or homicidal thoughts you must seek medical attention immediately by calling 911 or calling your MD immediately if symptoms less severe.  You must read complete instructions/literature along with all the possible adverse reactions/side effects for all the Medicines  you take and that have been prescribed to you. Take any new Medicines after you have completely understood and accpet all the possible adverse reactions/side effects.   Do not drive when taking Pain medications.   Do not take more than prescribed Pain, Sleep and Anxiety Medications  Special Instructions: If you have smoked or chewed Tobacco in the last 2 yrs please stop smoking, stop any regular Alcohol and or any Recreational drug use.  Wear Seat belts while driving.  Please note  You were cared for by a hospitalist during your hospital stay. Once you are discharged, your primary care physician will handle any further medical issues. Please note that NO REFILLS for any discharge medications will be authorized once you are discharged, as it is imperative that you return to your primary care physician (or establish a relationship with a primary care physician if you do not have one) for your aftercare needs so that they can reassess your need for medications and monitor your lab values.    Medication List    TAKE these medications        acetaminophen 500 MG tablet  Commonly known as:  TYLENOL  Take 500 mg by mouth every 6 (six) hours as needed for mild pain, moderate pain or headache.     amoxicillin 500 MG capsule  Commonly known as:  AMOXIL  Take 2,000 mg by mouth See admin instructions. Only for dental procedures     ARANESP IJ  Inject as directed every 30 (thirty) days.     aspirin 81 MG tablet  Take 81 mg by mouth daily.     ethyl chloride spray  Apply 1 application topically as needed (Before Dialysis).     INTEGRA PLUS Caps  Take 1 capsule by mouth every morning.     lidocaine-prilocaine cream  Commonly known as:  EMLA  Apply 1 application topically See admin instructions. Pt to apply cream each day before dialysis, Mon, Wed, Fri     multivitamin Tabs tablet  Take 1 tablet by mouth daily.     pantoprazole 40 MG tablet  Commonly known as:  PROTONIX  TAKE 1  TABLET (40 MG TOTAL) BY MOUTH DAILY.     polyethylene glycol packet  Commonly known as:  MIRALAX / GLYCOLAX  Take 17 g by mouth daily as needed for mild constipation.     predniSONE 5 MG tablet  Commonly known as:  DELTASONE  Take 1 tablet by mouth daily.     SENSIPAR 30 MG tablet  Generic drug:  cinacalcet  Take 30 mg by mouth every evening.     sevelamer carbonate 800 MG tablet  Commonly known as:  RENVELA  Take 800 mg by mouth 3 (three) times daily with meals.     tacrolimus 1 MG capsule  Commonly known as:  PROGRAF  Take 2 mg by mouth 2 (two) times daily.           Follow-up Information    Follow up with Quay Burow, MD. Schedule an appointment as soon as possible for a visit in 2 weeks.   Specialties:  Cardiology, Radiology   Contact information:   [address]  [place] [address] [phone]       The results of significant diagnostics from this hospitalization (including imaging, microbiology, ancillary and laboratory) are listed below for reference.    Significant Diagnostic Studies: Dg Chest 2 View  06/07/2015  CLINICAL DATA:  Chest pain began today after dialysis, more pressure on the right side of chest. Pt states she goes 3x per week for dialysis. EXAM: CHEST  2 VIEW COMPARISON:  12/18/2014 FINDINGS: Degraded lateral view, secondary to positioning. Patient rotated left on the frontal. Cardiomegaly accentuated by AP portable technique. Atherosclerosis in the transverse aorta. Possible small left pleural effusion on the lateral view. No pneumothorax. No congestive failure. Minimal scarring at the left lung base medially. IMPRESSION: No acute cardiopulmonary disease. Cardiomegaly without congestive failure. Possible trace left pleural fluid. Electronically Signed   By: Abigail Miyamoto M.D.   On: 06/07/2015 17:10   Nm Myocar Multi W/spect W/wall Motion / Ef  06/08/2015  CLINICAL DATA:  60-year-old female with chronic renal disease status post  failed renal transplant. On hemodialysis dialysis. Congestive heart failure. Recent chest pain EXAM: MYOCARDIAL IMAGING WITH SPECT (REST AND PHARMACOLOGIC-STRESS) GATED LEFT VENTRICULAR WALL MOTION STUDY LEFT VENTRICULAR EJECTION FRACTION TECHNIQUE: Standard myocardial SPECT imaging was performed after resting intravenous injection of 10 mCi Tc-71m tetrofosmin. Subsequently, intravenous infusion of Lexiscan was performed under the supervision of the Cardiology staff. At peak effect of the drug, 30 mCi Tc-71m tetrofosmin was injected intravenously and standard myocardial SPECT imaging was performed. Quantitative gated imaging was also performed to evaluate left ventricular wall motion, and estimate left ventricular ejection fraction. COMPARISON:  Myocardial Profusion scan 02/02/2014 FINDINGS: Stress EKG:  No ST changes. Perfusion: Small focus a decrease counts within the apical segment of the inferolateral wall fixed on rest and stress. This is present on exam from 01/23/2014. No evidence of reversible ischemia. Wall Motion: Normal left ventricular wall motion. No left ventricular dilation. Left Ventricular Ejection Fraction: 68 % End diastolic volume 75 ml End systolic volume 24 ml IMPRESSION: 1. No reversible ischemia.  Small scar in the inferolateral wall. 2. Normal left ventricular wall motion. 3. Left ventricular ejection fraction 68% 4. Non invasive risk stratification*: Low *2012 Appropriate Use Criteria for Coronary Revascularization Focused Update: J Am Coll Cardiol. [id number]. http://content.airportbarriers.com.aspx?articleid=[email or web address] Electronically Signed   By: Suzy Bouchard M.D.   On: 06/08/2015 14:10    Microbiology: No results found for this or any previous visit (from the past 240 hour(s)).   Labs: Basic Metabolic Panel:  Recent Labs Lab 06/07/15 1616 06/08/15 0704  NA 139 141  K 4.0 4.3  CL 100* 100*  CO2 30 30  GLUCOSE 139* 76  BUN 19 32*  CREATININE 4.13* 5.63*    CALCIUM 8.8* 8.5*   Liver Function Tests:  Recent Labs Lab 06/07/15 1616  AST 19  ALT 22  ALKPHOS 97  BILITOT 0.3  PROT 6.6  ALBUMIN 3.4*   No results for input(s): LIPASE, AMYLASE in the last 168 hours. No results for input(s): AMMONIA in the last 168 hours. CBC:  Recent Labs Lab 06/07/15 1616 06/08/15 0424  WBC 5.3 5.2  HGB 10.8* 9.8*  HCT 33.7* 30.9*  MCV 101.2* 102.0*  PLT 219 222   Cardiac Enzymes:  Recent Labs Lab 06/07/15 1616 06/07/15 1733 06/07/15 1930 06/08/15 0107 06/08/15 0704  TROPONINI <0.03 0.03 0.04* 0.04* 0.03   Signed:  Melqu[place] Journey  [facility] 06/08/2015, 3:11 PM

## 2015-06-08 NOTE — Progress Notes (Signed)
  Echocardiogram 2D Echocardiogram has been performed.  Karen [name] 06/08/2015, 11:08 AM

## 2015-06-08 NOTE — Progress Notes (Signed)
Pt's nuc is neg.  Pt informed.  [place] for discharge

## 2015-06-08 NOTE — Progress Notes (Signed)
ANTICOAGULATION CONSULT NOTE - Follow Up Consult  Pharmacy Consult for heparin Indication: chest pain/ACS and atrial fibrillation  Labs:  Recent Labs  06/07/15 1616 06/07/15 1733 06/07/15 1930 06/08/15 0107 06/08/15 0424  HGB 10.8*  --   --   --  9.8*  HCT 33.7*  --   --   --  30.9*  PLT 219  --   --   --  222  HEPARINUNFRC  --   --   --   --  0.76*  CREATININE 4.13*  --   --   --   --   TROPONINI <0.03 0.03 0.04* 0.04*  --     Assessment: 60yo female slightly above goal on heparin with initial dosing for CP/Afib, may still be affected by bolus.  Goal of Therapy:  Heparin level 0.3-0.7 units/ml   Plan:  Will decrease heparin gtt slightly to 900 units/hr and check level in [place], PharmD, BCPS  06/08/2015,5:35 AM

## 2015-06-08 NOTE — Consult Note (Signed)
Patient ID: Karen [name] MRN: [id number], DOB/AGE: [birth year]   Admit date: 06/07/2015   Primary Physician: Elyn Peers, MD Primary Cardiologist: Dr. Gwenlyn Found  Reason for Consult: CP Requesting MD: Dr. Eulas Post, IM  Pt. Profile:  59 y/o female with h/o HTN, CKD s/p failed renal transplant, now on HD, chronic diastolic CHF and GERD, admitted for CP evaluation.   Problem List  Past Medical History  Diagnosis Date  . Hypertension   . Chest pain Jan 2016    low risk Myoview   . GERD (gastroesophageal reflux disease)   . Complication of anesthesia 2005    difficulty remembering for a while  . Heart murmur Nov 2015    Aortic scleosis- no stenosis  . Shortness of breath dyspnea     with exertion  . Constipation   . Arthritis     knees  . Anemia   . ESRD on dialysis [facility]) April 2016    MWF    Past Surgical History  Procedure Laterality Date  . Rotator cuff repair Right 1997  . Cholecystectomy  2005  . Knee arthroscopy Bilateral   . Bunionectomy Bilateral   . Abdominal hysterectomy  2005  . Colonoscopy    . Av fistula placement Left 12/09/2013    Procedure: INSERTION OF ARTERIOVENOUS (AV) GORE-TEX GRAFT ARM;  Surgeon: Elam Dutch, MD;  Location: [facility];  Service: Vascular;  Laterality: Left;  . Kidney transplant  July 2016    failed     Allergies  No Known Allergies  HPI  60 y/o female with h/o HTN, CKD s/p failed renal transplant, now on HD, chronic diastolic CHF and GERD, admitted for CP evaluation. She had a NSR 01/23/2014 that was low risk. Last echo, 12/2014 showed normal LVEF of 60-65%, normal wall motion, Grade 2DD and trivial MR.  The patient was at HD yesterday, 5/22, when she developed chest pain and palpitations, roughly 1.5 hrs into her session. Per H&P, her HD session was ultimately aborted due to erratic heart rates and reportedly rhythm strips that showed atrial fibrillation. She was transported by EMS to the [facility] ED where telemetry showed NSR.  Initial and 2nd troponins were minimally elevated at 0.04. Third troponin is normal. Admit EKG showed sinus tach with a rate. Patient reports substernal chest pressure associated with her tachy palpitations. She denies any CP outside of palpitations. She is currently asymptomatic. Telemetry has shown no arrhthymias. HR is now well controlled, in NSR. K and TSH both WNL. 2D echo pending.    Home Medications  Prior to Admission medications   Medication Sig Start Date End Date Taking? Authorizing Provider  acetaminophen (TYLENOL) 500 MG tablet Take 500 mg by mouth every 6 (six) hours as needed for mild pain, moderate pain or headache.    Yes Historical Provider, MD  amoxicillin (AMOXIL) 500 MG capsule Take 2,000 mg by mouth See admin instructions. Only for dental procedures   Yes Historical Provider, MD  aspirin 81 MG tablet Take 81 mg by mouth daily.   Yes Historical Provider, MD  ethyl chloride spray Apply 1 application topically as needed (Before Dialysis).   Yes Historical Provider, MD  FeFum-FePoly-FA-B Cmp-C-Biot (INTEGRA PLUS) CAPS Take 1 capsule by mouth every morning.  04/26/15  Yes Historical Provider, MD  lidocaine-prilocaine (EMLA) cream Apply 1 application topically See admin instructions. Pt to apply cream each day before dialysis, Mon, Wed, Fri 05/26/15  Yes Historical Provider, MD  multivitamin (RENA-VIT) TABS tablet Take 1 tablet by  mouth daily.   Yes Historical Provider, MD  pantoprazole (PROTONIX) 40 MG tablet TAKE 1 TABLET (40 MG TOTAL) BY MOUTH DAILY. 07/21/14  Yes Lorretta Harp, MD  polyethylene glycol [facility] Floria Raveling) packet Take 17 g by mouth daily as needed for mild constipation.   Yes Historical Provider, MD  predniSONE (DELTASONE) 5 MG tablet Take 1 tablet by mouth daily. 10/22/14  Yes Historical Provider, MD  SENSIPAR 30 MG tablet Take 30 mg by mouth every evening.  04/23/15  Yes Historical Provider, MD  sevelamer carbonate (RENVELA) 800 MG tablet Take 800 mg by mouth 3  (three) times daily with meals.   Yes Historical Provider, MD  tacrolimus (PROGRAF) 1 MG capsule Take 2 mg by mouth 2 (two) times daily.  11/14/14  Yes Historical Provider, MD  Darbepoetin Alfa-Albumin (ARANESP IJ) Inject as directed every 30 (thirty) days.    Historical Provider, MD    Family History  Family History  Problem Relation Age of Onset  . Diabetes Sister   . Heart attack Father   . Hyperlipidemia Sister   . Hypertension Sister   . Kidney disease Father   . Kidney disease Mother   . Kidney disease Sister     x2    Social History  Social History   Social History  . Marital Status: Married    Spouse Name: N/A  . Number of Children: 2  . Years of Education: N/A   Occupational History  . [employer]   Social History Main Topics  . Smoking status: Never Smoker   . Smokeless tobacco: Never Used  . Alcohol Use: No  . Drug Use: No  . Sexual Activity: Yes    Birth Control/ Protection: None     Comment: Hysterectomy   Other Topics Concern  . Not on file   Social History Narrative     Review of Systems General:  No chills, fever, night sweats or weight changes.  Cardiovascular:  No chest pain, dyspnea on exertion, edema, orthopnea, palpitations, paroxysmal nocturnal dyspnea. Dermatological: No rash, lesions/masses Respiratory: No cough, dyspnea Urologic: No hematuria, dysuria Abdominal:   No nausea, vomiting, diarrhea, bright red blood per rectum, melena, or hematemesis Neurologic:  No visual changes, wkns, changes in mental status. All other systems reviewed and are otherwise negative except as noted above.  Physical Exam  Blood pressure 100/61, pulse 84, temperature 98.5 F (36.9 C), temperature source Oral, resp. rate 18, height 5\' 1"  (1.549 m), weight 198 lb 12.8 oz (90.175 kg), SpO2 99 %.  General: Pleasant, NAD Psych: Normal affect. Neuro: Alert and oriented X 3. Moves all extremities spontaneously. HEENT: Normal  Neck:  Supple without bruits or JVD. Lungs:  Resp regular and unlabored, CTA. Heart: RRR no s3, s4, or murmurs. Abdomen: Soft, non-tender, non-distended, BS + x 4.  Extremities: No clubbing, cyanosis or edema. DP/PT/Radials 2+ and equal bilaterally.  Labs  Troponin (Point of Care Test) No results for input(s): TROPIPOC in the last 72 hours.  Recent Labs  06/07/15 1733 06/07/15 1930 06/08/15 0107 06/08/15 0704  TROPONINI 0.03 0.04* 0.04* 0.03   Lab Results  Component Value Date   WBC 5.2 06/08/2015   HGB 9.8* 06/08/2015   HCT 30.9* 06/08/2015   MCV 102.0* 06/08/2015   PLT 222 06/08/2015    Recent Labs Lab 06/07/15 1616 06/08/15 0704  NA 139 141  K 4.0 4.3  CL 100* 100*  CO2 30 30  BUN 19 32*  CREATININE 4.13* 5.63*  CALCIUM 8.8* 8.5*  PROT 6.6  --   BILITOT 0.3  --   ALKPHOS 97  --   ALT 22  --   AST 19  --   GLUCOSE 139* 76   Lab Results  Component Value Date   CHOL 165 06/08/2015   HDL 59 06/08/2015   LDLCALC 97 06/08/2015   TRIG 44 06/08/2015   Lab Results  Component Value Date   DDIMER 1.63* 12/20/2014     Radiology/Studies  Dg Chest 2 View  06/07/2015  CLINICAL DATA:  Chest pain began today after dialysis, more pressure on the right side of chest. Pt states she goes 3x per week for dialysis. EXAM: CHEST  2 VIEW COMPARISON:  12/18/2014 FINDINGS: Degraded lateral view, secondary to positioning. Patient rotated left on the frontal. Cardiomegaly accentuated by AP portable technique. Atherosclerosis in the transverse aorta. Possible small left pleural effusion on the lateral view. No pneumothorax. No congestive failure. Minimal scarring at the left lung base medially. IMPRESSION: No acute cardiopulmonary disease. Cardiomegaly without congestive failure. Possible trace left pleural fluid. Electronically Signed   By: Abigail Miyamoto M.D.   On: 06/07/2015 17:10    ECG  Sinus tach 101 bpm. No ischemia  Telemetry: NSR. On arrhthymias.   Echocardiogram-  pending   ASSESSMENT AND PLAN  1. Tachy palpitations + CP: Telemetry has shown no arrhthymias. EKG w/o ischemia. HR is now well controlled, in NSR. K and TSH both WNL. 2D echo pending. Troponins minimally elevated x 2 at 0.04. 3rd troponin is negative. Exam is benign. She had a normal myoview 01/2014. EF normal by echo 12/2014. She is currently asymptomatic. Will plan a NST today to r/o coronary ischemia. Continue to monitor on telemetry to assess for arrhthymias. Agree with 2D echo. Can attempt to adjust BB, if BP allows. She is currently on low dose metoprolol, 12.5 mg BID.      Signed, Lyda Jester, PA-C 06/08/2015, 9:35 AM   Agree with note written by Ellen Henri  PAC  Pt well known to me with CRI s/p failed renal Tx on HD. Neg MV 1/16. Admitted with sensation of tachy palp at HD with CP. Currently NSR w/o arrhythmias. VSS. No CP. EKG w/o acute changes. Exam benign. Trop essentially nl at .04. Doubt ischemic CP. Will get Lexiscan today. Follow on tele.  Quay Burow 06/08/2015 10:24 AM

## 2015-06-08 NOTE — Progress Notes (Signed)
Orders received for pt discharge.  Discharge summary printed and reviewed with pt.  Explained medication regimen, and pt had no further questions at this time.  IV removed and site remains clean, dry, intact.  Telemetry removed.  Pt in stable condition and awaiting transport. 

## 2015-06-08 NOTE — Progress Notes (Signed)
ANTICOAGULATION CONSULT NOTE - Follow Up Consult  Pharmacy Consult for heparin Indication: chest pain/ACS and atrial fibrillation  Labs:  Recent Labs  06/07/15 1616  06/07/15 1930 06/08/15 0107 06/08/15 0424 06/08/15 0704 06/08/15 1355  HGB 10.8*  --   --   --  9.8*  --   --   HCT 33.7*  --   --   --  30.9*  --   --   PLT 219  --   --   --  222  --   --   HEPARINUNFRC  --   --   --   --  0.76*  --  0.75*  CREATININE 4.13*  --   --   --   --  5.63*  --   TROPONINI <0.03  < > 0.04* 0.04*  --  0.03  --   < > = values in this interval not displayed.  Assessment: 60yo female continues on heparin for chest pain/afib HL = 0.75  Goal of Therapy:  Heparin level 0.3-0.7 units/ml   Plan:  Heparin to 800 units / hr Follow up AM labs  Thank you Anette Guarneri, PharmD [phone] 06/08/2015,3:12 PM

## 2015-06-08 NOTE — Discharge Instructions (Signed)
Follow with Elyn Peers, MD in 2-4 weeks  Please get a complete blood count and chemistry panel checked by your Primary MD at your next visit, and again as instructed by your Primary MD. Please get your medications reviewed and adjusted by your Primary MD.  Please request your Primary MD to go over all Hospital Tests and Procedure/Radiological results at the follow up, please get all Hospital records sent to your Prim MD by signing hospital release before you go home.  If you had Pneumonia of Lung problems at the Hospital: Please get a 2 view Chest X ray done in 6-8 weeks after hospital discharge or sooner if instructed by your Primary MD.  If you have Congestive Heart Failure: Please call your Cardiologist or Primary MD anytime you have any of the following symptoms:  1) 3 pound weight gain in 24 hours or 5 pounds in 1 week  2) shortness of breath, with or without a dry hacking cough  3) swelling in the hands, feet or stomach  4) if you have to sleep on extra pillows at night in order to breathe  Follow cardiac low salt diet and 1.5 lit/day fluid restriction.  If you have diabetes Accuchecks 4 times/day, Once in AM empty stomach and then before each meal. Log in all results and show them to your primary doctor at your next visit. If any glucose reading is under 80 or above 300 call your primary MD immediately.  If you have Seizure/Convulsions/Epilepsy: Please do not drive, operate heavy machinery, participate in activities at heights or participate in high speed sports until you have seen by Primary MD or a Neurologist and advised to do so again.  If you had Gastrointestinal Bleeding: Please ask your Primary MD to check a complete blood count within one week of discharge or at your next visit. Your endoscopic/colonoscopic biopsies that are pending at the time of discharge, will also need to followed by your Primary MD.  Get Medicines reviewed and adjusted. Please take all your  medications with you for your next visit with your Primary MD  Please request your Primary MD to go over all hospital tests and procedure/radiological results at the follow up, please ask your Primary MD to get all Hospital records sent to his/her office.  If you experience worsening of your admission symptoms, develop shortness of breath, life threatening emergency, suicidal or homicidal thoughts you must seek medical attention immediately by calling 911 or calling your MD immediately  if symptoms less severe.  You must read complete instructions/literature along with all the possible adverse reactions/side effects for all the Medicines you take and that have been prescribed to you. Take any new Medicines after you have completely understood and accpet all the possible adverse reactions/side effects.   Do not drive or operate heavy machinery when taking Pain medications.   Do not take more than prescribed Pain, Sleep and Anxiety Medications  Special Instructions: If you have smoked or chewed Tobacco  in the last 2 yrs please stop smoking, stop any regular Alcohol  and or any Recreational drug use.  Wear Seat belts while driving.  Please note You were cared for by a hospitalist during your hospital stay. If you have any questions about your discharge medications or the care you received while you were in the hospital after you are discharged, you can call the unit and asked to speak with the hospitalist on call if the hospitalist that took care of you is not available. Once  you are discharged, your primary care physician will handle any further medical issues. Please note that NO REFILLS for any discharge medications will be authorized once you are discharged, as it is imperative that you return to your primary care physician (or establish a relationship with a primary care physician if you do not have one) for your aftercare needs so that they can reassess your need for medications and monitor your  lab values.  You can reach the hospitalist office at phone [phone] or fax [phone]   If you do not have a primary care physician, you can call [phone] for a physician referral.  Activity: As tolerated with Full fall precautions use walker/cane & assistance as needed  Diet: renal  Disposition Home

## 2015-06-08 NOTE — Progress Notes (Signed)
lexiscan myoview completed without complications.  nuc results to follow.

## 2015-06-08 NOTE — Progress Notes (Signed)
Nitro patch removed for [employer]. Gabriel Cirri, her nurse on 3E is aware

## 2015-06-08 NOTE — Progress Notes (Signed)
Per verbal instruction via Dr. Cruzita Lederer, ok to stop heparin gtts so that pt may be discharged home.

## 2015-06-16 ENCOUNTER — Ambulatory Visit (INDEPENDENT_AMBULATORY_CARE_PROVIDER_SITE_OTHER): Payer: 59 | Admitting: Physician Assistant

## 2015-06-16 ENCOUNTER — Encounter: Payer: Self-pay | Admitting: Physician Assistant

## 2015-06-16 VITALS — BP 104/66 | HR 82 | Ht 61.0 in | Wt 199.5 lb

## 2015-06-16 DIAGNOSIS — R079 Chest pain, unspecified: Secondary | ICD-10-CM

## 2015-06-16 DIAGNOSIS — I10 Essential (primary) hypertension: Secondary | ICD-10-CM | POA: Diagnosis not present

## 2015-06-16 DIAGNOSIS — IMO0001 Reserved for inherently not codable concepts without codable children: Secondary | ICD-10-CM

## 2015-06-16 NOTE — Patient Instructions (Signed)
Your physician wants you to follow-up in: 6 months or sooner if needed with Dr Gwenlyn Found. You will receive a reminder letter in the mail two months in advance. If you don't receive a letter, please call our office to schedule the follow-up appointment.   If you need a refill on your cardiac medications before your next appointment, please call your pharmacy.

## 2015-06-16 NOTE — Progress Notes (Signed)
Patient ID: Karen [name], female   DOB: 1955-11-09, 60 y.o.   MRN: [id number]    Date:  06/16/2015   ID:  Karen [name], DOB [birth year], MRN [id number]  PCP:  Elyn Peers, MD  Primary Cardiologist:  Gwenlyn Found  Chief Complaint  Patient presents with  . Hospitalization Follow-up    doing better just tired, occassional chest pressure     History of Present Illness: Karen [name] is a 60 y.o. 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  She underwent nuclear stress testing which was negative for ischemia and ejection fraction of 60%. Study was low risk. She also 2-D echocardiogram revealed ejection fraction of 55-60% with normal wall motion. Trivial pericardial effusion identified.  She is here for posthospital follow-up and reports occasionally having some 2 out of 10 right-sided chest pain which lasts just a few minutes.  No associated symptoms.  The patient currently denies nausea, vomiting, fever,  shortness of breath, orthopnea, dizziness, PND, cough, congestion, abdominal pain, hematochezia, melena, lower extremity edema, claudication.  Wt Readings from Last 3 Encounters:  06/16/15 199 lb 8 oz (90.493 kg)  06/08/15 198 lb 12.8 oz (90.175 kg)  05/11/15 193 lb (87.544 kg)     Past Medical History  Diagnosis Date  . Hypertension   . Chest pain Jan 2016    low risk Myoview   . GERD (gastroesophageal reflux disease)   . Complication of anesthesia 2005    difficulty remembering for a while  . Heart murmur Nov 2015    Aortic scleosis- no stenosis  . Shortness of breath dyspnea     with exertion  . Constipation   . Arthritis     knees  . Anemia   . ESRD on dialysis [facility]) April 2016    MWF    Current Outpatient Prescriptions  Medication Sig Dispense Refill  . acetaminophen (TYLENOL) 500 MG tablet Take 500 mg by mouth every 6 (six) hours as needed for mild pain, moderate pain or  headache.     Marland Kitchen amoxicillin (AMOXIL) 500 MG capsule Take 2,000 mg by mouth See admin instructions. Only for dental procedures    . aspirin 81 MG tablet Take 81 mg by mouth daily.    . Darbepoetin Alfa-Albumin (ARANESP IJ) Inject as directed every 30 (thirty) days.    Marland Kitchen ethyl chloride spray Apply 1 application topically as needed (Before Dialysis).    . FeFum-FePoly-FA-B Cmp-C-Biot (INTEGRA PLUS) CAPS Take 1 capsule by mouth every morning.   6  . lidocaine-prilocaine (EMLA) cream Apply 1 application topically See admin instructions. Pt to apply cream each day before dialysis, Mon, Wed, Fri  6  . multivitamin (RENA-VIT) TABS tablet Take 1 tablet by mouth daily.    . pantoprazole (PROTONIX) 40 MG tablet TAKE 1 TABLET (40 MG TOTAL) BY MOUTH DAILY. 30 tablet 9  . polyethylene glycol (MIRALAX / GLYCOLAX) packet Take 17 g by mouth daily as needed for mild constipation.    . predniSONE (DELTASONE) 5 MG tablet Take 1 tablet by mouth daily.  3  . SENSIPAR 30 MG tablet Take 30 mg by mouth every evening.   6  . sevelamer carbonate (RENVELA) 800 MG tablet Take 800 mg by mouth 3 (three) times daily with meals.    . tacrolimus (PROGRAF) 1 MG capsule Take 2 tabs by mouth twice a day  10   No current facility-administered medications for this  visit.    Allergies:   No Known Allergies  Social History:  The patient  reports that she has never smoked. She has never used smokeless tobacco. She reports that she does not drink alcohol or use illicit drugs.   Family history:   Family History  Problem Relation Age of Onset  . Diabetes Sister   . Heart attack Father   . Hyperlipidemia Sister   . Hypertension Sister   . Kidney disease Father   . Kidney disease Mother   . Kidney disease Sister     x2    ROS:  Please see the history of present illness.  All other systems reviewed and negative.   PHYSICAL EXAM: VS:  BP 104/66 mmHg  Pulse 82  Ht 5\' 1"  (1.549 m)  Wt 199 lb 8 oz (90.493 kg)  BMI 37.71  kg/m2 Obese, well developed, in no acute distress HEENT: Pupils are equal round react to light accommodation extraocular movements are intact.  Neck: no JVDNo cervical lymphadenopathy. Cardiac: Regular rate and rhythm without murmurs rubs or gallops. Lungs:  clear to auscultation bilaterally, no wheezing, rhonchi or rales Abd: soft, nontender, positive bowel sounds all quadrants, no hepatosplenomegaly Ext: no lower extremity edema.  2+ radial and dorsalis pedis pulses. Skin: warm and dry Neuro:  Grossly normal    ASSESSMENT AND PLAN:  Problem List Items Addressed This Visit    Obesity, Class II, BMI 35.0-39.9, with comorbidity (see actual BMI) (HCC)   Essential hypertension   Chest pain - Primary      Karen [name] underwent nuclear stress testing during her recent hospitalization which was negative for ischemia with normal ejection fraction. She also had a normal echocardiogram. She continues to have an occasional mild right-sided chest pain. This is noncardiac could be musculoskeletal or some radiculopathy. It is mild and I asked her to follow-up with her primary doctor but continues. Her blood pressures well-controlled. While she was hospitalized she had no significant arrhythmias on telemetry. She will follow-up in 6 months with Dr. Gwenlyn Found

## 2015-06-21 ENCOUNTER — Other Ambulatory Visit: Payer: Self-pay | Admitting: Cardiovascular Disease

## 2015-08-24 ENCOUNTER — Other Ambulatory Visit: Payer: Self-pay | Admitting: Vascular Surgery

## 2015-08-24 DIAGNOSIS — T82511D Breakdown (mechanical) of surgically created arteriovenous shunt, subsequent encounter: Secondary | ICD-10-CM

## 2015-08-26 ENCOUNTER — Ambulatory Visit (INDEPENDENT_AMBULATORY_CARE_PROVIDER_SITE_OTHER): Payer: 59 | Admitting: Vascular Surgery

## 2015-08-26 ENCOUNTER — Ambulatory Visit (HOSPITAL_COMMUNITY)
Admission: RE | Admit: 2015-08-26 | Discharge: 2015-08-26 | Disposition: A | Discharge status: Home / Self Care | Payer: 59 | Source: Ambulatory Visit | Attending: Vascular Surgery | Admitting: Vascular Surgery

## 2015-08-26 ENCOUNTER — Encounter: Payer: Self-pay | Admitting: Vascular Surgery

## 2015-08-26 ENCOUNTER — Other Ambulatory Visit: Payer: Self-pay

## 2015-08-26 VITALS — BP 96/64 | HR 91 | Temp 98.9°F | Resp 16 | Ht 61.0 in | Wt 200.0 lb

## 2015-08-26 DIAGNOSIS — I120 Hypertensive chronic kidney disease with stage 5 chronic kidney disease or end stage renal disease: Secondary | ICD-10-CM | POA: Diagnosis not present

## 2015-08-26 DIAGNOSIS — T829XXA Unspecified complication of cardiac and vascular prosthetic device, implant and graft, initial encounter: Secondary | ICD-10-CM

## 2015-08-26 DIAGNOSIS — N184 Chronic kidney disease, stage 4 (severe): Secondary | ICD-10-CM | POA: Diagnosis not present

## 2015-08-26 DIAGNOSIS — K219 Gastro-esophageal reflux disease without esophagitis: Secondary | ICD-10-CM | POA: Diagnosis not present

## 2015-08-26 DIAGNOSIS — T82511D Breakdown (mechanical) of surgically created arteriovenous shunt, subsequent encounter: Secondary | ICD-10-CM | POA: Diagnosis not present

## 2015-08-26 DIAGNOSIS — Y841 Kidney dialysis as the cause of abnormal reaction of the patient, or of later complication, without mention of misadventure at the time of the procedure: Secondary | ICD-10-CM | POA: Diagnosis not present

## 2015-08-26 DIAGNOSIS — Z09 Encounter for follow-up examination after completed treatment for conditions other than malignant neoplasm: Secondary | ICD-10-CM | POA: Diagnosis present

## 2015-08-26 DIAGNOSIS — N186 End stage renal disease: Secondary | ICD-10-CM | POA: Diagnosis not present

## 2015-08-26 DIAGNOSIS — Y832 Surgical operation with anastomosis, bypass or graft as the cause of abnormal reaction of the patient, or of later complication, without mention of misadventure at the time of the procedure: Secondary | ICD-10-CM | POA: Insufficient documentation

## 2015-08-26 NOTE — Progress Notes (Signed)
[facility] and [facility]in Specialist of [facility]  Patient name: Karen [name] MRN: [id number] DOB: 09/15/1955 Sex: female  REASON FOR VISIT: low flow rates dialysis access  HPI: Karen [name] is a 60 y.o. female who presents for evaluation of her left upper arm graft that was placed on 12/09/13. She says that there were low flow rates during dialysis one day last week. The patient dialyzes on Mondays, Wednesdays and Fridays. She denies any issues her last two dialysis sessions. Her main nephrologist is Dr. Marval Regal.   She denies any bleeding issues with dialysis. She has a history of failed renal transplant. She is not on anticoagulation.     Past Medical History:  Diagnosis Date  . Anemia   . Arthritis    knees  . Chest pain Jan 2016   low risk Myoview   . Complication of anesthesia 2005   difficulty remembering for a while  . Constipation   . ESRD on dialysis [facility]) April 2016   MWF  . GERD (gastroesophageal reflux disease)   . Heart murmur Nov 2015   Aortic scleosis- no stenosis  . Hypertension   . Shortness of breath dyspnea    with exertion    Family History  Problem Relation Age of Onset  . Diabetes Sister   . Heart attack Father   . Hyperlipidemia Sister   . Hypertension Sister   . Kidney disease Father   . Kidney disease Mother   . Kidney disease Sister     x2    SOCIAL HISTORY: Social History  Substance Use Topics  . Smoking status: Never Smoker  . Smokeless tobacco: Never Used  . Alcohol use No    No Known Allergies  Current Outpatient Prescriptions  Medication Sig Dispense Refill  . acetaminophen (TYLENOL) 500 MG tablet Take 500 mg by mouth every 6 (six) hours as needed for mild pain, moderate pain or headache.     Marland Kitchen amoxicillin (AMOXIL) 500 MG capsule Take 2,000 mg by mouth See admin instructions. Only for dental procedures    . aspirin 81 MG tablet Take 81 mg by mouth daily.    . Darbepoetin Alfa-Albumin (ARANESP IJ) Inject as directed every 30  (thirty) days.    Marland Kitchen ethyl chloride spray Apply 1 application topically as needed (Before Dialysis).    . FeFum-FePoly-FA-B Cmp-C-Biot (INTEGRA PLUS) CAPS Take 1 capsule by mouth every morning.   6  . lidocaine-prilocaine (EMLA) cream Apply 1 application topically See admin instructions. Pt to apply cream each day before dialysis, Mon, Wed, Fri  6  . multivitamin (RENA-VIT) TABS tablet Take 1 tablet by mouth daily.    . pantoprazole (PROTONIX) 40 MG tablet TAKE 1 TABLET (40 MG TOTAL) BY MOUTH DAILY. 30 tablet 6  . polyethylene glycol (MIRALAX / GLYCOLAX) packet Take 17 g by mouth daily as needed for mild constipation.    . predniSONE (DELTASONE) 5 MG tablet Take 1 tablet by mouth daily.  3  . SENSIPAR 30 MG tablet Take 30 mg by mouth every evening.   6  . sevelamer carbonate (RENVELA) 800 MG tablet Take 800 mg by mouth 3 (three) times daily with meals.    . tacrolimus (PROGRAF) 1 MG capsule Take 2 tabs by mouth twice a day  10   No current facility-administered medications for this visit.     REVIEW OF SYSTEMS:  [X]  denotes positive finding, [ ]  denotes negative finding Cardiac  Comments:  Chest pain or chest pressure:    Shortness  of breath upon exertion:    Short of breath when lying flat:    Irregular heart rhythm:        Vascular    Pain in calf, thigh, or hip brought on by ambulation:    Pain in feet at night that wakes you up from your sleep:     Blood clot in your veins:    Leg swelling:         Pulmonary    Oxygen at home:    Productive cough:     Wheezing:         Neurologic    Sudden weakness in arms or legs:     Sudden numbness in arms or legs:     Sudden onset of difficulty speaking or slurred speech:    Temporary loss of vision in one eye:     Problems with dizziness:         Gastrointestinal    Blood in stool:     Vomited blood:         Genitourinary    Burning when urinating:     Blood in urine:        Psychiatric    Major depression:           Hematologic    Bleeding problems:    Problems with blood clotting too easily:        Skin    Rashes or ulcers:        Constitutional    Fever or chills:      PHYSICAL EXAM: Vitals:   08/26/15 1342  BP: 96/64  Pulse: 91  Resp: 16  Temp: 98.9 F (37.2 C)  TempSrc: Oral  SpO2: 100%  Weight: 200 lb (90.7 kg)  Height: 5\' 1"  (1.549 m)    GENERAL: The patient is a well-nourished female, in no acute distress. The vital signs are documented above. CARDIAC: There is a regular rate and rhythm.  VASCULAR: Palpable thrill left upper arm graft. Audible bruit. PULMONARY: There is good air exchange bilaterally without wheezing or rales. MUSCULOSKELETAL: Left arm appears slightly more swollen than right. NEUROLOGIC: No focal weakness or paresthesias are detected. SKIN: There are no ulcers or rashes noted. PSYCHIATRIC: The patient has a normal affect.  DATA:  Dialysis graft duplex evaluation 08/26/2015  The left upper arm graft is patent. There were no significant increases in velocities throughout the graft. Velocity of 3 79 cm/s at the inflow anastomosis. 2 39 cm/s at the distal graft and 2 97 cm/s at the outflow anastomosis. Flow volume is 1057 cc/min.   MEDICAL ISSUES: Poorly functioning left upper arm graft  Plan for left upper extremity shuntogram with possible intervention on 08/31/2015 with Dr. Trula Slade.     Virgina Jock, PA-C [facility] of [facility]   History and exam details as above. Patient has a patent left upper arm graft but increasing pressures on dialysis. We will perform a shuntogram to make sure that she does not have any outflow narrowing.  Ruta Hinds, MD [facility] of [place] Office: [phone] Pager: [phone]

## 2015-08-31 ENCOUNTER — Encounter (HOSPITAL_COMMUNITY)
Admission: RE | Disposition: A | Discharge status: Home / Self Care | Payer: Self-pay | Source: Ambulatory Visit | Attending: Surgery

## 2015-08-31 ENCOUNTER — Ambulatory Visit (HOSPITAL_COMMUNITY)
Admission: RE | Admit: 2015-08-31 | Discharge: 2015-08-31 | Disposition: A | Discharge status: Home / Self Care | Payer: 59 | Source: Ambulatory Visit | Attending: Surgery | Admitting: Surgery

## 2015-08-31 ENCOUNTER — Encounter (HOSPITAL_COMMUNITY): Payer: Self-pay | Admitting: Surgery

## 2015-08-31 DIAGNOSIS — T82858A Stenosis of vascular prosthetic devices, implants and grafts, initial encounter: Secondary | ICD-10-CM | POA: Diagnosis not present

## 2015-08-31 DIAGNOSIS — K219 Gastro-esophageal reflux disease without esophagitis: Secondary | ICD-10-CM | POA: Diagnosis not present

## 2015-08-31 DIAGNOSIS — Z833 Family history of diabetes mellitus: Secondary | ICD-10-CM | POA: Insufficient documentation

## 2015-08-31 DIAGNOSIS — I120 Hypertensive chronic kidney disease with stage 5 chronic kidney disease or end stage renal disease: Secondary | ICD-10-CM | POA: Insufficient documentation

## 2015-08-31 DIAGNOSIS — Z7982 Long term (current) use of aspirin: Secondary | ICD-10-CM | POA: Diagnosis not present

## 2015-08-31 DIAGNOSIS — R079 Chest pain, unspecified: Secondary | ICD-10-CM | POA: Insufficient documentation

## 2015-08-31 DIAGNOSIS — Z8249 Family history of ischemic heart disease and other diseases of the circulatory system: Secondary | ICD-10-CM | POA: Diagnosis not present

## 2015-08-31 DIAGNOSIS — Z992 Dependence on renal dialysis: Secondary | ICD-10-CM | POA: Insufficient documentation

## 2015-08-31 DIAGNOSIS — K5900 Constipation, unspecified: Secondary | ICD-10-CM | POA: Insufficient documentation

## 2015-08-31 DIAGNOSIS — N186 End stage renal disease: Secondary | ICD-10-CM | POA: Insufficient documentation

## 2015-08-31 DIAGNOSIS — D649 Anemia, unspecified: Secondary | ICD-10-CM | POA: Insufficient documentation

## 2015-08-31 DIAGNOSIS — Y832 Surgical operation with anastomosis, bypass or graft as the cause of abnormal reaction of the patient, or of later complication, without mention of misadventure at the time of the procedure: Secondary | ICD-10-CM | POA: Insufficient documentation

## 2015-08-31 DIAGNOSIS — M1990 Unspecified osteoarthritis, unspecified site: Secondary | ICD-10-CM | POA: Diagnosis not present

## 2015-08-31 HISTORY — PX: PERIPHERAL VASCULAR CATHETERIZATION: SHX172C

## 2015-08-31 LAB — POCT I-STAT, CHEM 8
BUN: 51 mg/dL — ABNORMAL HIGH (ref 6–20)
CALCIUM ION: 0.94 mmol/L — AB (ref 1.12–1.23)
CHLORIDE: 99 mmol/L — AB (ref 101–111)
CREATININE: 6 mg/dL — AB (ref 0.44–1.00)
GLUCOSE: 97 mg/dL (ref 65–99)
HCT: 34 % — ABNORMAL LOW (ref 36.0–46.0)
HEMOGLOBIN: 11.6 g/dL — AB (ref 12.0–15.0)
Potassium: 5.6 mmol/L — ABNORMAL HIGH (ref 3.5–5.1)
Sodium: 140 mmol/L (ref 135–145)
TCO2: 34 mmol/L (ref 0–100)

## 2015-08-31 SURGERY — A/V SHUNTOGRAM/FISTULAGRAM

## 2015-08-31 MED ORDER — LIDOCAINE HCL (PF) 1 % IJ SOLN
INTRAMUSCULAR | Status: DC | PRN
  Administered 2015-08-31: 2 mL via SUBCUTANEOUS

## 2015-08-31 MED ORDER — LIDOCAINE HCL (PF) 1 % IJ SOLN
INTRAMUSCULAR | Status: AC
  Filled 2015-08-31: qty 30

## 2015-08-31 MED ORDER — METOPROLOL TARTRATE 5 MG/5ML IV SOLN: 2.0000 mg | INTRAVENOUS | Status: DC | PRN

## 2015-08-31 MED ORDER — OXYCODONE HCL 5 MG PO TABS: 5.0000 mg | ORAL_TABLET | ORAL | Status: DC | PRN

## 2015-08-31 MED ORDER — FENTANYL CITRATE (PF) 100 MCG/2ML IJ SOLN
INTRAMUSCULAR | Status: AC
  Filled 2015-08-31: qty 2

## 2015-08-31 MED ORDER — HEPARIN (PORCINE) IN NACL 2-0.9 UNIT/ML-% IJ SOLN
INTRAMUSCULAR | Status: AC
  Filled 2015-08-31: qty 500

## 2015-08-31 MED ORDER — SODIUM CHLORIDE 0.9% FLUSH: 3.0000 mL | INTRAVENOUS | Status: DC | PRN

## 2015-08-31 MED ORDER — HEPARIN SODIUM (PORCINE) 1000 UNIT/ML IJ SOLN
INTRAMUSCULAR | Status: DC | PRN
  Administered 2015-08-31: 1000 [IU] via INTRAVENOUS

## 2015-08-31 MED ORDER — MIDAZOLAM HCL 2 MG/2ML IJ SOLN
INTRAMUSCULAR | Status: AC
  Filled 2015-08-31: qty 2

## 2015-08-31 MED ORDER — ONDANSETRON HCL 4 MG/2ML IJ SOLN: 4.0000 mg | Freq: Four times a day (QID) | INTRAMUSCULAR | Status: DC | PRN

## 2015-08-31 MED ORDER — LABETALOL HCL 5 MG/ML IV SOLN: 10.0000 mg | INTRAVENOUS | Status: DC | PRN

## 2015-08-31 MED ORDER — MIDAZOLAM HCL 2 MG/2ML IJ SOLN
INTRAMUSCULAR | Status: DC | PRN
  Administered 2015-08-31: 1 mg via INTRAVENOUS

## 2015-08-31 MED ORDER — GUAIFENESIN-DM 100-10 MG/5ML PO SYRP: 15.0000 mL | ORAL_SOLUTION | ORAL | Status: DC | PRN

## 2015-08-31 MED ORDER — FENTANYL CITRATE (PF) 100 MCG/2ML IJ SOLN
INTRAMUSCULAR | Status: DC | PRN
  Administered 2015-08-31: 25 ug via INTRAVENOUS

## 2015-08-31 MED ORDER — ACETAMINOPHEN 325 MG RE SUPP: 325.0000 mg | RECTAL | Status: DC | PRN

## 2015-08-31 MED ORDER — ACETAMINOPHEN 325 MG PO TABS: 325.0000 mg | ORAL_TABLET | ORAL | Status: DC | PRN

## 2015-08-31 MED ORDER — HYDRALAZINE HCL 20 MG/ML IJ SOLN: 5.0000 mg | INTRAMUSCULAR | Status: DC | PRN

## 2015-08-31 MED ORDER — DOCUSATE SODIUM 100 MG PO CAPS: 100.0000 mg | ORAL_CAPSULE | Freq: Every day | ORAL | Status: DC

## 2015-08-31 MED ORDER — IODIXANOL 320 MG/ML IV SOLN
INTRAVENOUS | Status: DC | PRN
  Administered 2015-08-31: 40 mL via INTRAVENOUS

## 2015-08-31 MED ORDER — PHENOL 1.4 % MT LIQD: 1.0000 | OROMUCOSAL | Status: DC | PRN

## 2015-08-31 MED ORDER — ALUM & MAG HYDROXIDE-SIMETH 200-200-20 MG/5ML PO SUSP: 15.0000 mL | ORAL | Status: DC | PRN

## 2015-08-31 SURGICAL SUPPLY — 20 items
BAG SNAP BAND KOVER 36X36 (MISCELLANEOUS) ×4 IMPLANT
BALLN MUSTANG 8.0X40 75 (BALLOONS) ×3
BALLN MUSTANG 8.0X40 75CM (BALLOONS) ×1
BALLOON MUSTANG 8.0X40 75 (BALLOONS) ×2 IMPLANT
CATH OMNI FLUSH 5F 65CM (CATHETERS) IMPLANT
COVER DOME SNAP 22 D (MISCELLANEOUS) ×4 IMPLANT
COVER PRB 48X5XTLSCP FOLD TPE (BAG) ×2 IMPLANT
COVER PROBE 5X48 (BAG) ×2
KIT ENCORE 26 ADVANTAGE (KITS) ×4 IMPLANT
KIT MICROINTRODUCER STIFF 5F (SHEATH) ×4 IMPLANT
KIT PV (KITS) IMPLANT
PROTECTION STATION PRESSURIZED (MISCELLANEOUS) ×4
SHEATH PINNACLE 5F 10CM (SHEATH) IMPLANT
SHEATH PINNACLE R/O II 6F 4CM (SHEATH) ×4 IMPLANT
SHIELD RADPAD SCOOP 12X17 (MISCELLANEOUS) ×4 IMPLANT
STATION PROTECTION PRESSURIZED (MISCELLANEOUS) ×2 IMPLANT
STOPCOCK MORSE 400PSI 3WAY (MISCELLANEOUS) ×4 IMPLANT
TRAY PV CATH (CUSTOM PROCEDURE TRAY) ×4 IMPLANT
TUBING CIL FLEX 10 FLL-RA (TUBING) ×4 IMPLANT
WIRE BENTSON .035X145CM (WIRE) ×4 IMPLANT

## 2015-08-31 NOTE — Op Note (Signed)
    Patient name: Karen [name] MRN: [id number] DOB: 09-08-1955 Sex: female  08/31/2015 Pre-operative Diagnosis: End-stage renal disease Post-operative diagnosis:  Same Surgeon:  [name] Procedure Performed:  1.  Ultrasound-guided access, left upper arm dialysis graft  2.  Shuntogram  3.  Angioplasty, left axillary and brachial vein  4.  Conscious sedation of (25 minutes)   Indications:  The patient is having difficulty with dialysis access.  She is here today for shuntogram  Procedure:  The patient was identified in the holding area and taken to room 8.  The patient was then placed supine on the table and prepped and draped in the usual sterile fashion.  A time out was called.  Conscious sedation was performed with the use of IV fentanyl and Versed under continuous monitoring by the circulating nurse and physician.  Heart rate and blood pressure and oxygen saturations were continuously monitored.  Ultrasound was used to evaluate the fistula.  The vein was patent and compressible.  A digital ultrasound image was acquired.  The fistula was then accessed under ultrasound guidance using a micropuncture needle.  An 018 wire was then asvanced without resistance and a micropuncture sheath was placed.  Contrast injections were then performed through the sheath.  Findings:  No central venous stenosis.  There is approximately 80% stenosis for a relatively long segment in the axillary and brachial vein.  The arterial  anastomosis is widely patent   Intervention:  Over a 035 wire, a 6 French sheath was inserted.  I selected an 8 x 40 balloon and performed balloon angioplasty in 3 separate locations, spanning the entire diseased segment.  Completion imaging studies revealed resolution of the stenosis with only a mild residual narrowing in the brachial vein which I did not want to dilate any further for fear of potential rupture.  The sheath was then removed with a pursestring suture.  No  complications  Impression:  #1  moderately stenosis within the axillary and brachial vein successfully dilated using an 8 mm balloon    V. [name], M.D. [facility] [place] Office: [phone] Pager:  [phone]

## 2015-08-31 NOTE — H&P (View-Only) (Signed)
Vascular and Vein Specialist of [facility]  Patient name: Karen [name] MRN: [id number] DOB: 01/27/1955 Sex: female  REASON FOR VISIT: low flow rates dialysis access  HPI: Karen [name] is a 60 y.o. female who presents for evaluation of her left upper arm graft that was placed on 12/09/13. She says that there were low flow rates during dialysis one day last week. The patient dialyzes on Mondays, Wednesdays and Fridays. She denies any issues her last two dialysis sessions. Her main nephrologist is Dr. Marval Regal.   She denies any bleeding issues with dialysis. She has a history of failed renal transplant. She is not on anticoagulation.     Past Medical History:  Diagnosis Date  . Anemia   . Arthritis    knees  . Chest pain Jan 2016   low risk Myoview   . Complication of anesthesia 2005   difficulty remembering for a while  . Constipation   . ESRD on dialysis [facility]) April 2016   MWF  . GERD (gastroesophageal reflux disease)   . Heart murmur Nov 2015   Aortic scleosis- no stenosis  . Hypertension   . Shortness of breath dyspnea    with exertion    Family History  Problem Relation Age of Onset  . Diabetes Sister   . Heart attack Father   . Hyperlipidemia Sister   . Hypertension Sister   . Kidney disease Father   . Kidney disease Mother   . Kidney disease Sister     x2    SOCIAL HISTORY: Social History  Substance Use Topics  . Smoking status: Never Smoker  . Smokeless tobacco: Never Used  . Alcohol use No    No Known Allergies  Current Outpatient Prescriptions  Medication Sig Dispense Refill  . acetaminophen (TYLENOL) 500 MG tablet Take 500 mg by mouth every 6 (six) hours as needed for mild pain, moderate pain or headache.     Marland Kitchen amoxicillin (AMOXIL) 500 MG capsule Take 2,000 mg by mouth See admin instructions. Only for dental procedures    . aspirin 81 MG tablet Take 81 mg by mouth daily.    . Darbepoetin Alfa-Albumin (ARANESP IJ) Inject as directed every 30  (thirty) days.    Marland Kitchen ethyl chloride spray Apply 1 application topically as needed (Before Dialysis).    . FeFum-FePoly-FA-B Cmp-C-Biot (INTEGRA PLUS) CAPS Take 1 capsule by mouth every morning.   6  . lidocaine-prilocaine (EMLA) cream Apply 1 application topically See admin instructions. Pt to apply cream each day before dialysis, Mon, Wed, Fri  6  . multivitamin (RENA-VIT) TABS tablet Take 1 tablet by mouth daily.    . pantoprazole (PROTONIX) 40 MG tablet TAKE 1 TABLET (40 MG TOTAL) BY MOUTH DAILY. 30 tablet 6  . polyethylene glycol (MIRALAX / GLYCOLAX) packet Take 17 g by mouth daily as needed for mild constipation.    . predniSONE (DELTASONE) 5 MG tablet Take 1 tablet by mouth daily.  3  . SENSIPAR 30 MG tablet Take 30 mg by mouth every evening.   6  . sevelamer carbonate (RENVELA) 800 MG tablet Take 800 mg by mouth 3 (three) times daily with meals.    . tacrolimus (PROGRAF) 1 MG capsule Take 2 tabs by mouth twice a day  10   No current facility-administered medications for this visit.     REVIEW OF SYSTEMS:  [X]  denotes positive finding, [ ]  denotes negative finding Cardiac  Comments:  Chest pain or chest pressure:    Shortness  of breath upon exertion:    Short of breath when lying flat:    Irregular heart rhythm:        Vascular    Pain in calf, thigh, or hip brought on by ambulation:    Pain in feet at night that wakes you up from your sleep:     Blood clot in your veins:    Leg swelling:         Pulmonary    Oxygen at home:    Productive cough:     Wheezing:         Neurologic    Sudden weakness in arms or legs:     Sudden numbness in arms or legs:     Sudden onset of difficulty speaking or slurred speech:    Temporary loss of vision in one eye:     Problems with dizziness:         Gastrointestinal    Blood in stool:     Vomited blood:         Genitourinary    Burning when urinating:     Blood in urine:        Psychiatric    Major depression:           Hematologic    Bleeding problems:    Problems with blood clotting too easily:        Skin    Rashes or ulcers:        Constitutional    Fever or chills:      PHYSICAL EXAM: Vitals:   08/26/15 1342  BP: 96/64  Pulse: 91  Resp: 16  Temp: 98.9 F (37.2 C)  TempSrc: Oral  SpO2: 100%  Weight: 200 lb (90.7 kg)  Height: 5\' 1"  (1.549 m)    GENERAL: The patient is a well-nourished female, in no acute distress. The vital signs are documented above. CARDIAC: There is a regular rate and rhythm.  VASCULAR: Palpable thrill left upper arm graft. Audible bruit. PULMONARY: There is good air exchange bilaterally without wheezing or rales. MUSCULOSKELETAL: Left arm appears slightly more swollen than right. NEUROLOGIC: No focal weakness or paresthesias are detected. SKIN: There are no ulcers or rashes noted. PSYCHIATRIC: The patient has a normal affect.  DATA:  Dialysis graft duplex evaluation 08/26/2015  The left upper arm graft is patent. There were no significant increases in velocities throughout the graft. Velocity of 3 79 cm/s at the inflow anastomosis. 2 39 cm/s at the distal graft and 2 97 cm/s at the outflow anastomosis. Flow volume is 1057 cc/min.   MEDICAL ISSUES: Poorly functioning left upper arm graft  Plan for left upper extremity shuntogram with possible intervention on 08/31/2015 with Dr. Trula Slade.     Virgina Jock, PA-C [facility] of [facility]   History and exam details as above. Patient has a patent left upper arm graft but increasing pressures on dialysis. We will perform a shuntogram to make sure that she does not have any outflow narrowing.  Ruta Hinds, MD [facility] of [place] Office: [phone] Pager: [phone]

## 2015-08-31 NOTE — Interval H&P Note (Signed)
History and Physical Interval Note:  08/31/2015 8:20 AM  Karen [name]  has presented today for surgery, with the diagnosis of left arm - instage renal  The various methods of treatment have been discussed with the patient and family. After consideration of risks, benefits and other options for treatment, the patient has consented to  Procedure(s): A/V Shuntogram (N/A) as a surgical intervention .  The patient's history has been reviewed, patient examined, no change in status, stable for surgery.  I have reviewed the patient's chart and labs.  Questions were answered to the patient's satisfaction.     [name]

## 2015-08-31 NOTE — Discharge Instructions (Signed)
Fistulogram, Care After °Refer to this sheet in the next few weeks. These instructions provide you with information on caring for yourself after your procedure. Your health care provider may also give you more specific instructions. Your treatment has been planned according to current medical practices, but problems sometimes occur. Call your health care provider if you have any problems or questions after your procedure. °WHAT TO EXPECT AFTER THE PROCEDURE °After your procedure, it is typical to have the following: °· A small amount of discomfort in the area where the catheters were placed. °· A small amount of bruising around the fistula. °· Sleepiness and fatigue. °HOME CARE INSTRUCTIONS °· Rest at home for the day following your procedure. °· Do not drive or operate heavy machinery while taking pain medicine. °· Take medicines only as directed by your health care provider. °· Do not take baths, swim, or use a hot tub until your health care provider approves. You may shower 24 hours after the procedure or as directed by your health care provider. °· There are many different ways to close and cover an incision, including stitches, skin glue, and adhesive strips. Follow your health care provider's instructions on: °¨ Incision care. °¨ Bandage (dressing) changes and removal. °¨ Incision closure removal. °· Monitor your dialysis fistula carefully. °SEEK MEDICAL CARE IF: °· You have drainage, redness, swelling, or pain at your catheter site. °· You have a fever. °· You have chills. °SEEK IMMEDIATE MEDICAL CARE IF: °· You feel weak. °· You have trouble balancing. °· You have trouble moving your arms or legs. °· You have problems with your speech or vision. °· You can no longer feel a vibration or buzz when you put your fingers over your dialysis fistula. °· The limb that was used for the procedure: °¨ Swells. °¨ Is painful. °¨ Is cold. °¨ Is discolored, such as blue or pale white. °  °This information is not intended  to replace advice given to you by your health care provider. Make sure you discuss any questions you have with your health care provider. °  °Document Released: 05/19/2013 Document Reviewed: 05/19/2013 °Elsevier Interactive Patient Education ©2016 Elsevier Inc. ° °

## 2015-09-01 MED FILL — Heparin Sodium (Porcine) 2 Unit/ML in Sodium Chloride 0.9%: INTRAMUSCULAR | Qty: 500 | Status: AC

## 2015-09-09 ENCOUNTER — Ambulatory Visit: Payer: 59 | Admitting: Vascular Surgery

## 2015-11-01 ENCOUNTER — Telehealth: Payer: Self-pay | Admitting: Cardiovascular Disease

## 2015-11-01 NOTE — Telephone Encounter (Signed)
Pt calling in w complaint of burning, sore upper abd/chest pain for past 2 weeks, which is persistent sometimes for up to an hour. Patient initially attributed to indigestion. She notes this is off and on daily/semi-daily. She denies SOB, radiating jaw arm or neck pain. Pt now not so sure this is indigestion, and called today w request to be seen tomorrow. Explains she is in dialysis 3 days a week (M,W,F) and unable to schedule those days.  Politely apologized and explained that I didn't have availability (checked Dr. Kennon Holter calendar and all APP slots this week & current DoD schedules). There is an opening this Thursday, but patient stated she could not make this appt due to a scheduled procedure for work on her dialysis shunt.  I advised pt that if symptoms recurrent and active it was appropriate to have an evaluation in the ED for r/o of cardiac and other causes. Pt voiced understanding and noted if she feels symptoms warrant attention in this manner she will go to hospital.

## 2015-11-24 ENCOUNTER — Emergency Department (HOSPITAL_COMMUNITY)
Admission: EM | Admit: 2015-11-24 | Discharge: 2015-11-24 | Disposition: A | Discharge status: Home / Self Care | Payer: 59 | Attending: Emergency Medicine | Admitting: Emergency Medicine

## 2015-11-24 ENCOUNTER — Emergency Department (HOSPITAL_COMMUNITY): Payer: 59

## 2015-11-24 ENCOUNTER — Encounter (HOSPITAL_COMMUNITY): Payer: Self-pay | Admitting: Emergency Medicine

## 2015-11-24 DIAGNOSIS — I5033 Acute on chronic diastolic (congestive) heart failure: Secondary | ICD-10-CM | POA: Diagnosis not present

## 2015-11-24 DIAGNOSIS — N186 End stage renal disease: Secondary | ICD-10-CM | POA: Insufficient documentation

## 2015-11-24 DIAGNOSIS — Z7982 Long term (current) use of aspirin: Secondary | ICD-10-CM | POA: Insufficient documentation

## 2015-11-24 DIAGNOSIS — R002 Palpitations: Secondary | ICD-10-CM | POA: Diagnosis present

## 2015-11-24 DIAGNOSIS — I132 Hypertensive heart and chronic kidney disease with heart failure and with stage 5 chronic kidney disease, or end stage renal disease: Secondary | ICD-10-CM | POA: Diagnosis not present

## 2015-11-24 DIAGNOSIS — I4891 Unspecified atrial fibrillation: Secondary | ICD-10-CM | POA: Insufficient documentation

## 2015-11-24 DIAGNOSIS — Z992 Dependence on renal dialysis: Secondary | ICD-10-CM | POA: Insufficient documentation

## 2015-11-24 LAB — CBC WITH DIFFERENTIAL/PLATELET
BASOS PCT: 1 %
Basophils Absolute: 0 10*3/uL (ref 0.0–0.1)
Eosinophils Absolute: 0.1 10*3/uL (ref 0.0–0.7)
Eosinophils Relative: 2 %
HEMATOCRIT: 40.3 % (ref 36.0–46.0)
Hemoglobin: 13.1 g/dL (ref 12.0–15.0)
Lymphocytes Relative: 18 %
Lymphs Abs: 1 10*3/uL (ref 0.7–4.0)
MCH: 32.3 pg (ref 26.0–34.0)
MCHC: 32.5 g/dL (ref 30.0–36.0)
MCV: 99.3 fL (ref 78.0–100.0)
MONO ABS: 0.4 10*3/uL (ref 0.1–1.0)
MONOS PCT: 7 %
NEUTROS ABS: 4 10*3/uL (ref 1.7–7.7)
Neutrophils Relative %: 72 %
Platelets: 217 10*3/uL (ref 150–400)
RBC: 4.06 MIL/uL (ref 3.87–5.11)
RDW: 14.6 % (ref 11.5–15.5)
WBC: 5.4 10*3/uL (ref 4.0–10.5)

## 2015-11-24 LAB — MAGNESIUM: MAGNESIUM: 2.1 mg/dL (ref 1.7–2.4)

## 2015-11-24 LAB — BASIC METABOLIC PANEL
Anion gap: 11 (ref 5–15)
BUN: 16 mg/dL (ref 6–20)
CALCIUM: 8.8 mg/dL — AB (ref 8.9–10.3)
CO2: 31 mmol/L (ref 22–32)
CREATININE: 4.33 mg/dL — AB (ref 0.44–1.00)
Chloride: 95 mmol/L — ABNORMAL LOW (ref 101–111)
GFR calc Af Amer: 12 mL/min — ABNORMAL LOW (ref >60)
GFR calc non Af Amer: 10 mL/min — ABNORMAL LOW (ref >60)
GLUCOSE: 135 mg/dL — AB (ref 65–99)
Potassium: 4.1 mmol/L (ref 3.5–5.1)
Sodium: 137 mmol/L (ref 135–145)

## 2015-11-24 LAB — TROPONIN I: Troponin I: 0.03 ng/mL (ref <0.03)

## 2015-11-24 MED ORDER — DILTIAZEM LOAD VIA INFUSION
20.0000 mg | Freq: Once | INTRAVENOUS | Status: AC
  Administered 2015-11-24: 20 mg via INTRAVENOUS
  Filled 2015-11-24: qty 20

## 2015-11-24 MED ORDER — DILTIAZEM HCL 100 MG IV SOLR
5.0000 mg/h | INTRAVENOUS | Status: DC
  Administered 2015-11-24: 5 mg/h via INTRAVENOUS
  Filled 2015-11-24: qty 100

## 2015-11-24 MED ORDER — SODIUM CHLORIDE 0.9 % IV BOLUS (SEPSIS)
250.0000 mL | Freq: Once | INTRAVENOUS | Status: AC
  Administered 2015-11-24: 250 mL via INTRAVENOUS

## 2015-11-24 NOTE — ED Notes (Signed)
Pt is ready for discharge. Getting dressed.

## 2015-11-24 NOTE — ED Notes (Signed)
Pt to xray

## 2015-11-24 NOTE — ED Notes (Signed)
Dr. Regenia Skeeter at bedside

## 2015-11-24 NOTE — ED Triage Notes (Signed)
Per [facility], pt at dialysis and started feeling palpiations and chest tightness, given some fluids per diaylsis due to pressures being in the 80s. Pt finished treatment, sent here. Pt goes ina nd out of afib at this time. At the beginning of this she had similar episode of afib but was not given any medicine for afib. Pt in NAD, denies pain or sob, just states she can feel the palpitations. Pt in NAD. HR shows 132 afib.

## 2015-11-24 NOTE — ED Notes (Addendum)
Trop 0.03, MD aware

## 2015-11-24 NOTE — ED Provider Notes (Signed)
[place] DEPT Provider Note   CSN: [id number] Arrival date & time: 11/24/15  1624     History   Chief Complaint Chief Complaint  Patient presents with  . Palpitations    HPI Karen [name] is a 60 y.o. female.  HPI  60 year old female with a history of end-stage renal disease on dialysis presents with palpitations and chest pain. Back in May 2017 she was diagnosed with atrial fibrillation and states this feels similar. She was in dialysis when all of a sudden she started feeling the palpitations and chest pressure. These have been coming and going and currently she states the pressure is down to a 1 out of 10 and she feels no palpitations. On the monitor she is still in atrial fibrillation with a fast ventricular response. No associated shortness of breath. Patient states she was at dialysis and has otherwise been feeling well. Has not been having palpitations or other symptoms in the last few days. No new medicines. She is not on a blood thinners besides a baby aspirin. No leg swelling. No dizziness/lightheadedness.  Past Medical History:  Diagnosis Date  . Anemia   . Arthritis    knees  . Chest pain Jan 2016   low risk Myoview   . Complication of anesthesia 2005   difficulty remembering for a while  . Constipation   . ESRD on dialysis [facility]) April 2016   MWF  . GERD (gastroesophageal reflux disease)   . Heart murmur Nov 2015   Aortic scleosis- no stenosis  . Hypertension   . Shortness of breath dyspnea    with exertion    Patient Active Problem List   Diagnosis Date Noted  . Elevated troponin I level 06/07/2015  . Paroxysmal atrial fibrillation ([place]) 06/07/2015  . Klebsiella pneumoniae sepsis ([place]) 12/22/2014  . Sinus tachycardia 12/21/2014  . Dyspnea 12/21/2014  . Infection of urinary tract 12/20/2014  . Sepsis ([place]) 12/19/2014  . Fever 12/18/2014  . Fever, unspecified 12/17/2014  . Thrush 12/17/2014  . Erythema induratum 12/17/2014  . Febrile illness     . Diastolic dysfunction 40/76/8088  . Obesity, Class II, BMI 35.0-39.9, with comorbidity (see actual BMI) 11/19/2014  . Bilateral lower extremity edema 04/28/2014  . Acute on chronic diastolic heart failure ([place]) 02/24/2014  . Chronic constipation 05/26/2013  . Dyspepsia 08/08/2012  . Chronic nausea 08/08/2012  . Chest pain 06/12/2012  . Essential hypertension 06/12/2012  . End stage renal disease on dialysis [facility]) 07/04/2011    Past Surgical History:  Procedure Laterality Date  . ABDOMINAL HYSTERECTOMY  2005  . AV FISTULA PLACEMENT Left 12/09/2013   Procedure: INSERTION OF ARTERIOVENOUS (AV) GORE-TEX GRAFT ARM;  Surgeon: Elam Dutch, MD;  Location: [place];  Service: Vascular;  Laterality: Left;  . BUNIONECTOMY Bilateral   . CHOLECYSTECTOMY  2005  . COLONOSCOPY    . 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 KNEE ARTHROSCOPY Bilateral   . PERIPHERAL VASCULAR CATHETERIZATION N/A 08/31/2015   Procedure: A/V Shuntogram;  Surgeon: Serafina Mitchell, MD;  Location: [place] CV LAB;  Service: Cardiovascular;  Laterality: N/A;  . PERIPHERAL VASCULAR CATHETERIZATION Left 08/31/2015   Procedure: Peripheral Vascular Balloon Angioplasty;  Surgeon: Serafina Mitchell, MD;  Location: [place] CV LAB;  Service: Cardiovascular;  Laterality: Left;  arm fistula  . ROTATOR CUFF REPAIR Right 1997    OB History    No data available       Home Medications    Prior to  Admission medications   Medication Sig Start Date End Date Taking? Authorizing Provider  amoxicillin (AMOXIL) 500 MG capsule Take 2,000 mg by mouth See admin instructions. Only for dental procedures   Yes Historical Provider, MD  aspirin 81 MG tablet Take 81 mg by mouth daily.   Yes Historical Provider, MD  ethyl chloride spray Apply 1 application topically as needed (Before Dialysis).   Yes Historical Provider, MD  lidocaine-prilocaine (EMLA) cream Apply 1 application topically See admin instructions. Pt to apply cream each day  before dialysis, Mon, Wed, Fri 05/26/15  Yes Historical Provider, MD  multivitamin (RENA-VIT) TABS tablet Take 1 tablet by mouth daily.   Yes Historical Provider, MD  pantoprazole (PROTONIX) 40 MG tablet TAKE 1 TABLET (40 MG TOTAL) BY MOUTH DAILY. 06/21/15  Yes Lorretta Harp, MD  predniSONE (DELTASONE) 5 MG tablet Take 1 tablet by mouth daily. 10/22/14  Yes Historical Provider, MD  SENSIPAR 30 MG tablet Take 30 mg by mouth every evening.  04/23/15  Yes Historical Provider, MD  sevelamer carbonate (RENVELA) 800 MG tablet Take 800-2,400 mg by mouth See admin instructions. 3 tablets (2400 mg) three (3) times a day with meals and 1 tablet (800 mg) twice daily with snacks   Yes Historical Provider, MD  tacrolimus (PROGRAF) 1 MG capsule Take 2 mg by mouth 2 (two) times daily.  11/14/14  Yes Historical Provider, MD  acetaminophen (TYLENOL) 500 MG tablet Take 1,000 mg by mouth daily as needed for mild pain, moderate pain or headache.     Historical Provider, MD  Darbepoetin Alfa-Albumin (ARANESP IJ) Inject as directed every 30 (thirty) days.    Historical Provider, MD  docusate sodium (COLACE) 100 MG capsule Take 100 mg by mouth 2 (two) times daily as needed for mild constipation.    Historical Provider, MD  FeFum-FePoly-FA-B Cmp-C-Biot (INTEGRA PLUS) CAPS Take 1 capsule by mouth every morning.  04/26/15   Historical Provider, MD  polyethylene glycol (MIRALAX / GLYCOLAX) packet Take 17 g by mouth daily as needed for mild constipation.    Historical Provider, MD    Family History Family History  Problem Relation Age of Onset  . Kidney disease Mother   . Heart attack Father   . Kidney disease Father   . Diabetes Sister   . Hyperlipidemia Sister   . Hypertension Sister   . Kidney disease Sister     x2    Social History Social History  Substance Use Topics  . Smoking status: Never Smoker  . Smokeless tobacco: Never Used  . Alcohol use No     Allergies   Patient has no known  allergies.   Review of Systems Review of Systems  Respiratory: Negative for shortness of breath.   Cardiovascular: Positive for chest pain and palpitations. Negative for leg swelling.  Neurological: Negative for dizziness and light-headedness.  All other systems reviewed and are negative.    Physical Exam Updated Vital Signs BP 99/63   Pulse 80   Temp 97.5 F (36.4 C) (Oral)   Resp 13   SpO2 95%   Physical Exam  Constitutional: She is oriented to person, place, and time. She appears well-developed and well-nourished. No distress.  HENT:  Head: Normocephalic and atraumatic.  Right Ear: External ear normal.  Left Ear: External ear normal.  Nose: Nose normal.  Eyes: Right eye exhibits no discharge. Left eye exhibits no discharge.  Cardiovascular: Normal heart sounds.  An irregular rhythm present. Tachycardia present.   Pulmonary/Chest: Effort normal  and breath sounds normal. She has no rales.  Abdominal: Soft. There is no tenderness.  Neurological: She is alert and oriented to person, place, and time.  Skin: Skin is warm and dry. She is not diaphoretic.  Nursing note and vitals reviewed.    ED Treatments / Results  Labs (all labs ordered are listed, but only abnormal results are displayed) Labs Reviewed  BASIC METABOLIC PANEL - Abnormal; Notable for the following:       Result Value   Chloride 95 (*)    Glucose, Bld 135 (*)    Creatinine, Ser 4.33 (*)    Calcium 8.8 (*)    GFR calc non Af Amer 10 (*)    GFR calc Af Amer 12 (*)    All other components within normal limits  TROPONIN I - Abnormal; Notable for the following:    Troponin I 0.03 (*)    All other components within normal limits  CBC WITH DIFFERENTIAL/PLATELET  MAGNESIUM    EKG  EKG Interpretation  Date/Time:  Wednesday November 24 2015 16:57:29 EST Ventricular Rate:  150 PR Interval:    QRS Duration: 84 QT Interval:  312 QTC Calculation: 493 R Axis:   10 Text Interpretation:  Atrial  fibrillation Borderline prolonged QT interval no significant change since earlier in the day Confirmed by Laiah Pouncey MD, Estelene Carmack [phone]) on 11/24/2015 5:03:10 PM       EKG Interpretation  Date/Time:  Wednesday November 24 2015 18:04:14 EST Ventricular Rate:  78 PR Interval:    QRS Duration: 90 QT Interval:  405 QTC Calculation: 462 R Axis:   10 Text Interpretation:  Sinus rhythm LVH by voltage Afib resolved no acute ST/T changes Confirmed by Regenia Skeeter MD, Ameila Weldon [phone]) on 11/24/2015 6:39:07 PM        Radiology Dg Chest 2 View  Result Date: 11/24/2015 CLINICAL DATA:  Mid chest pain while at dialysis today. No shortness of breath. EXAM: CHEST  2 VIEW COMPARISON:  PA and lateral chest x-ray dated Jun 07, 2015 FINDINGS: The lungs are well-expanded. There is no focal infiltrate. There is no pleural effusion. The heart and pulmonary vascularity are normal. The mediastinum is normal in width. There is faint calcification in the wall of the aortic arch. There is mild upper thoracic spine dextro curvature which is stable. IMPRESSION: There is no CHF, pneumonia, nor other acute cardiopulmonary abnormality. Aortic atherosclerosis. Electronically Signed   By: David  Martinique M.D.   On: 11/24/2015 17:28    Procedures Procedures (including critical care time)  Medications Ordered in ED Medications  diltiazem (CARDIZEM) 1 mg/mL load via infusion 20 mg (20 mg Intravenous Bolus from Bag 11/24/15 1715)    And  diltiazem (CARDIZEM) 100 mg in dextrose 5 % 100 mL (1 mg/mL) infusion (0 mg/hr Intravenous Stopped 11/24/15 1821)  sodium chloride 0.9 % bolus 250 mL (0 mLs Intravenous Stopped 11/24/15 1735)     Initial Impression / Assessment and Plan / ED Course  I have reviewed the triage vital signs and the nursing notes.  Pertinent labs & imaging results that were available during my care of the patient were reviewed by me and considered in my medical decision making (see chart for details).  Clinical Course  as of Nov 24 1910  Wed Nov 24, 2015  1650 Appears to have afib/flutter causing her symptoms. Oddly she is basically asymptomatic despite having a HR in the 130s-140s. Seems to have sudden onset but now asymptomatic but still in irregular rhythm. BP stable.  Labs, CXR, etc  [SG]  1707 BP remains stable. HR still in 130s-150s but still asymptomatic. While she obviously has had symptoms this afternoon if she's not symptomatic now I don't think I can reliably know the onset and thus I think cardioversion is out in the ED. Will rate control with diltiazem.  [SG]  1754 After diltiazem her HR is in 80s and appears sinus on tele. Will repeat ECG. Stop drip and see how she does.  [SG]  1911 Patient is still asymptomatic. BP 99 systolic but this is not too uncommon for her after dialysis (per her). Since she's asymptomatic I think she's ok to go home, discussed strict return precautions  [SG]    Clinical Course User Index [SG] Sherwood Gambler, MD    Patient symptoms have completely resolved. No chest pressure or palpitations. Diltiazem both brought her heart rate down into the 80s and also converted her to sinus rhythm. I discussed her case with cardiology on-call, Dr. Acie Fredrickson. Her CHADSVASC score is 2 given history of HTN (although she is not on anything). Heart rate is 80 and blood pressure is in the low 100s consistently. He recommends giving any type of rate control at discharge. Stay on baby aspirin and discuss with her cardiologist in a very close outpatient follow-up about further anticoagulation. It is unclear why she was not anticoagulated during the last admission. I have discussed this with patient who agrees to follow-up closely as well as agreeing to return if any symptoms worsen or recur. Given pain was only with afib Dr. Acie Fredrickson recommends no further troponin testing and d/c with f/u with Dr. Gwenlyn Found.  Final Clinical Impressions(s) / ED Diagnoses   Final diagnoses:  Atrial fibrillation with rapid  ventricular response [facility])    New Prescriptions New Prescriptions   No medications on file     Sherwood Gambler, MD 11/24/15 1912

## 2015-12-02 ENCOUNTER — Ambulatory Visit (INDEPENDENT_AMBULATORY_CARE_PROVIDER_SITE_OTHER): Payer: 59 | Admitting: Cardiology

## 2015-12-02 ENCOUNTER — Other Ambulatory Visit: Payer: Self-pay | Admitting: Cardiovascular Disease

## 2015-12-02 ENCOUNTER — Encounter: Payer: Self-pay | Admitting: Cardiology

## 2015-12-02 VITALS — BP 100/64 | HR 97 | Ht 61.0 in | Wt 203.8 lb

## 2015-12-02 DIAGNOSIS — N186 End stage renal disease: Secondary | ICD-10-CM | POA: Diagnosis not present

## 2015-12-02 DIAGNOSIS — Z992 Dependence on renal dialysis: Secondary | ICD-10-CM | POA: Diagnosis not present

## 2015-12-02 DIAGNOSIS — E669 Obesity, unspecified: Secondary | ICD-10-CM

## 2015-12-02 DIAGNOSIS — I480 Paroxysmal atrial fibrillation: Secondary | ICD-10-CM | POA: Diagnosis not present

## 2015-12-02 DIAGNOSIS — R079 Chest pain, unspecified: Secondary | ICD-10-CM

## 2015-12-02 DIAGNOSIS — IMO0001 Reserved for inherently not codable concepts without codable children: Secondary | ICD-10-CM

## 2015-12-02 MED ORDER — METOPROLOL TARTRATE 25 MG PO TABS: 25.0000 mg | ORAL_TABLET | Freq: Two times a day (BID) | ORAL | 3 refills | Status: DC

## 2015-12-02 NOTE — Assessment & Plan Note (Addendum)
HD MWF- Dr Marval Regal follows

## 2015-12-02 NOTE — Assessment & Plan Note (Signed)
BMI 37  

## 2015-12-02 NOTE — Patient Instructions (Addendum)
Medication Instructions:  START Lopressor 25mg  Take 1 tablet once a day   Labwork: None   Testing/Procedures: Cardiac CT angiogram Your physician has requested that you have cardiac CT. Cardiac computed tomography (CT) is a painless test that uses an x-ray machine to take clear, detailed pictures of your heart. For further information please visit [facility]. Please follow instruction sheet as given.  Follow-Up: Your physician recommends that you schedule a follow-up appointment in: 3 MONTHS WITH DR Gwenlyn Found  Any Other Special Instructions Will Be Listed Below (If Applicable).     If you need a refill on your cardiac medications before your next appointment, please call your pharmacy.

## 2015-12-02 NOTE — Assessment & Plan Note (Signed)
Pt has had chest pain off and on for years. We did not cath her in the past secondary to CRI but she is now on HD. She had a negative Dobutamine echo in Feb 2017 at [facility] and a negative Lexiscan Myoview here in May 2017. Her chet pain now has atypical features.

## 2015-12-02 NOTE — Progress Notes (Signed)
    12/02/2015 Oleh Genin   November 29, 1955  [id number]  Primary Physician Elyn Peers, MD Primary Cardiologist: Dr Gwenlyn Found  HPI:  60 y/o obese AA female with a history of renal disease. 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 She has a history of chest pain. Dobutamine stress echo at [facility] Feb 2017 was negative and a Lexiscan May 2017 was negative. In May 2017 she had presented with suspected PAF though I don't think it was documented. She was watched overnight on telemetry and discharged  After her Myoview came back negative. She was seen again in The ED 11/24/15, this time with rapid AF and chest pain while on dialysis. She converted to NSR in the ED after Diltiazem. She is in the office today for follow up. She denies any further palpitations but says she has had constant chest pressure since 11/8. Her symptoms are not exertional.     emtry No Known Allergies  Social History   Social History  . Marital status: Married    Spouse name: N/A  . Number of children: 2  . Years of education: N/A   Occupational History  . [employer]   Social History Main Topics  . Smoking status: Never Smoker  . Smokeless tobacco: Never Used  . Alcohol use No  . Drug use: No  . Sexual activity: Yes    Birth control/ protection: None     Comment: Hysterectomy   Other Topics Concern  . Not on file   Social History Narrative  . No narrative on file     Review of Systems: General: negative for chills, fever, night sweats or weight changes.  Cardiovascular: negative for dyspnea on exertion, edema, orthopnea, paroxysmal nocturnal dyspnea or shortness of breath Dermatological: negative for rash Respiratory: negative for cough or wheezing Urologic: negative for hematuria Abdominal: negative for nausea, vomiting, diarrhea, bright red blood per rectum, melena, or hematemesis Neurologic: negative for visual changes, syncope, or dizziness All other systems reviewed  and are otherwise negative except as noted above.    Blood pressure 100/64, pulse 97, height 5\' 1"  (1.549 m), weight 203 lb 12.8 oz (92.4 kg).  General appearance: alert, cooperative, no distress and moderately obese Lungs: clear to auscultation bilaterally Heart: regular rate and rhythm Extremities: extremities normal, atraumatic, no cyanosis or edema Skin: Skin color, texture, turgor normal. No rashes or lesions Neurologic: Grossly normal  EKG NSR-96  ASSESSMENT AND PLAN:   End stage renal disease on dialysis (HCC) HD MWF- Dr Marval Regal follows  Obesity, Class II, BMI 35.0-39.9, with comorbidity (see actual BMI) BMI 37  Chest pain with moderate risk of acute coronary syndrome Pt has had chest pain off and on for years. We did not cath her in the past secondary to CRI but she is now on HD. She had a negative Dobutamine echo in Feb 2017 at [facility] and a negative Lexiscan Myoview here in May 2017. Her chest pain now has atypical features.     PLAN  Pt seen by Dr Gwenlyn Found and myself. She is no longer hypertensive so her CHADs VASc =1 for sex. For now will not Rx with Coumadin, continue ASA. We also added Lopressor 25 mg BID and will arrange for a coronary CTA.  Kerin Ransom PA-C 12/02/2015 4:10 PM

## 2015-12-08 ENCOUNTER — Encounter: Payer: Self-pay | Admitting: Cardiovascular Disease

## 2015-12-28 ENCOUNTER — Ambulatory Visit (HOSPITAL_COMMUNITY)
Admission: RE | Admit: 2015-12-28 | Discharge: 2015-12-28 | Disposition: A | Discharge status: Home / Self Care | Payer: 59 | Source: Ambulatory Visit | Attending: Cardiology | Admitting: Cardiology

## 2015-12-28 ENCOUNTER — Encounter (HOSPITAL_COMMUNITY): Payer: Self-pay

## 2015-12-28 ENCOUNTER — Telehealth: Payer: Self-pay | Admitting: Cardiovascular Disease

## 2015-12-28 DIAGNOSIS — I872 Venous insufficiency (chronic) (peripheral): Secondary | ICD-10-CM | POA: Insufficient documentation

## 2015-12-28 NOTE — Telephone Encounter (Signed)
Left detailed message.   

## 2015-12-28 NOTE — Telephone Encounter (Signed)
New message      Pt has a test this am at 11:30.  She want to know if she should take her medications.  Please call

## 2015-12-28 NOTE — Telephone Encounter (Signed)
Per scheduling instruction: Liquids only 4 hours prior to your exam. Any medications can be taken as usual. Please arrive 30 min prior to your scheduled exam time.  Pt notified of above instruction.

## 2015-12-28 NOTE — Telephone Encounter (Signed)
Sent message to scheduling to call and re-schedule

## 2015-12-28 NOTE — Telephone Encounter (Signed)
There is no other test short of cath that would give me approp info

## 2015-12-28 NOTE — Telephone Encounter (Signed)
New message  Pt had CT CORONARY MORPH W/CTA COR W/ [[id number] scheduled for today  They were unable to do the procedure due to not being able to get a vein  Pt wants to know if a different procedure needs to be done instead  Please call back and advise

## 2015-12-28 NOTE — Telephone Encounter (Signed)
So do you want to try CT again or do the cath? Which would youlike?

## 2015-12-28 NOTE — Telephone Encounter (Signed)
Asked Dr. Gwenlyn Found. He would like to do the CT again.

## 2015-12-30 ENCOUNTER — Other Ambulatory Visit: Payer: Self-pay | Admitting: Cardiology

## 2015-12-30 DIAGNOSIS — R079 Chest pain, unspecified: Secondary | ICD-10-CM

## 2015-12-31 NOTE — Telephone Encounter (Signed)
CT scheduled for 01/11/16. Dr. Johnsie Cancel to read.

## 2016-01-11 ENCOUNTER — Ambulatory Visit (HOSPITAL_COMMUNITY)
Admission: RE | Admit: 2016-01-11 | Discharge: 2016-01-11 | Disposition: A | Discharge status: Home / Self Care | Payer: 59 | Source: Ambulatory Visit | Attending: Cardiology | Admitting: Cardiology

## 2016-01-11 ENCOUNTER — Other Ambulatory Visit: Payer: Self-pay | Admitting: Cardiology

## 2016-01-11 ENCOUNTER — Encounter (HOSPITAL_COMMUNITY): Payer: Self-pay

## 2016-01-11 ENCOUNTER — Telehealth: Payer: Self-pay | Admitting: Cardiology

## 2016-01-11 DIAGNOSIS — N289 Disorder of kidney and ureter, unspecified: Secondary | ICD-10-CM | POA: Insufficient documentation

## 2016-01-11 DIAGNOSIS — I2510 Atherosclerotic heart disease of native coronary artery without angina pectoris: Secondary | ICD-10-CM | POA: Insufficient documentation

## 2016-01-11 DIAGNOSIS — K449 Diaphragmatic hernia without obstruction or gangrene: Secondary | ICD-10-CM | POA: Insufficient documentation

## 2016-01-11 DIAGNOSIS — R079 Chest pain, unspecified: Secondary | ICD-10-CM

## 2016-01-11 DIAGNOSIS — I700 Atherosclerosis of aorta: Secondary | ICD-10-CM | POA: Diagnosis not present

## 2016-01-11 HISTORY — PX: IR GENERIC HISTORICAL: IMG1180011

## 2016-01-11 MED ORDER — METOPROLOL TARTRATE 5 MG/5ML IV SOLN
INTRAVENOUS | Status: AC
  Filled 2016-01-11: qty 10

## 2016-01-11 MED ORDER — METOPROLOL TARTRATE 5 MG/5ML IV SOLN
INTRAVENOUS | Status: AC
  Filled 2016-01-11: qty 5

## 2016-01-11 MED ORDER — IOPAMIDOL (ISOVUE-370) INJECTION 76%
INTRAVENOUS | Status: AC
  Filled 2016-01-11: qty 50

## 2016-01-11 MED ORDER — LIDOCAINE HCL (PF) 2 % IJ SOLN
INTRAMUSCULAR | Status: AC
  Filled 2016-01-11: qty 10

## 2016-01-11 MED ORDER — IOPAMIDOL (ISOVUE-370) INJECTION 76%
INTRAVENOUS | Status: AC
  Administered 2016-01-11: 80 mL
  Filled 2016-01-11: qty 100

## 2016-01-11 MED ORDER — NITROGLYCERIN 0.4 MG SL SUBL
0.8000 mg | SUBLINGUAL_TABLET | Freq: Once | SUBLINGUAL | Status: DC
  Filled 2016-01-11: qty 25

## 2016-01-11 MED ORDER — NITROGLYCERIN 0.4 MG SL SUBL
SUBLINGUAL_TABLET | SUBLINGUAL | Status: AC
  Administered 2016-01-11: 12:00:00
  Filled 2016-01-11: qty 2

## 2016-01-11 MED ORDER — METOPROLOL TARTRATE 5 MG/5ML IV SOLN
5.0000 mg | INTRAVENOUS | Status: DC | PRN
  Administered 2016-01-11 (×3): 5 mg via INTRAVENOUS
  Filled 2016-01-11: qty 5

## 2016-01-11 NOTE — Telephone Encounter (Signed)
Returned call to [employer] [facility]-wanted to make [place] aware that CT coronary has been completed and read.  Results available in Epic.  Advised I would make Like McBride PA aware.

## 2016-01-11 NOTE — Telephone Encounter (Signed)
Please let pt know her coronary CT showed no obstructive CAD- just mild narrowing. This is not causing her chest pain.  Kerin Ransom PA-C 01/11/2016 2:22 PM

## 2016-01-11 NOTE — Telephone Encounter (Signed)
New message  Andee Poles from [facility] call requesting to speak with RN or Kerin Ransom. Please call back to discuss

## 2016-01-11 NOTE — Procedures (Signed)
Interventional Radiology Procedure Note  Procedure: Placement of a right basilic vein US guided IV.  OK for immediate use.  Complications: None Recommendations:  - Ok to use - Do not submerge for 7 days - Routine care   Signed,  Dulcy Fanny. Earleen Newport, DO

## 2016-01-11 NOTE — Telephone Encounter (Signed)
Called patient-made aware of CT results:  Erlene Quan, PA-C   01/11/16 2:22 PM  Note    Please let pt know her coronary CT showed no obstructive CAD- just mild narrowing. This is not causing her chest pain.  Kerin Ransom PA-C 01/11/2016 2:22 PM      Pt reports she continues to have episodes of chest pressure-reports her nephrologist doesn't want her to take her metoprolol on dialysis days and she notices she has CP on these days.  Patient wants to what the next steps will be since she continues to have this pain.  Advised that per PA her chest pain may not be cardiac related but I would sent message to PA and ask if further testing or workup is needed or recommended at this time.    Pt has follow up on 2/13 with Dr. Gwenlyn Found.  Pt aware and verbalized understanding at this time.

## 2016-01-12 NOTE — Telephone Encounter (Signed)
Please see if her appointment with Dr Gwenlyn Found can be moved up  Kerin Ransom PA-C 01/12/2016 4:49 PM

## 2016-01-13 NOTE — Telephone Encounter (Signed)
Called patient, first available appointment with Dr Gwenlyn Found offered to patient.    Appointment made with Dr. Gwenlyn Found 1/30 at 11:30 [facility] location.  (Patient can only come on Tuesday or Thursday d/t dialysis, first available scheduled).   Pt verbalized understanding. Advised to call with further questions/concerns.

## 2016-02-15 ENCOUNTER — Encounter: Payer: Self-pay | Admitting: Cardiovascular Disease

## 2016-02-15 ENCOUNTER — Ambulatory Visit (INDEPENDENT_AMBULATORY_CARE_PROVIDER_SITE_OTHER): Payer: 59 | Admitting: Cardiovascular Disease

## 2016-02-15 VITALS — BP 92/58 | HR 73 | Ht 61.0 in | Wt 203.2 lb

## 2016-02-15 DIAGNOSIS — I480 Paroxysmal atrial fibrillation: Secondary | ICD-10-CM | POA: Diagnosis not present

## 2016-02-15 NOTE — Patient Instructions (Signed)
Medication Instructions: Your physician recommends that you continue on your current medications as directed. Please refer to the Current Medication list given to you today.   Follow-Up: We request that you follow-up in: 6 months with Luke Kilroy, PA-C and in 12 months with Dr Berry.  You will receive a reminder letter in the mail two months in advance. If you don't receive a letter, please call our office to schedule the follow-up appointment.  If you need a refill on your cardiac medications before your next appointment, please call your pharmacy.  

## 2016-02-15 NOTE — Progress Notes (Signed)
02/15/2016 Karen [name]   May 04, 1955  [id number]  Primary Physician Elyn Peers, MD Primary Cardiologist: Lorretta Harp MD Renae Gloss  HPI:  [name] is a 61 year old married African American female mother of 2, grandmother to 2 grandchildren who currently works as an Sales executive. I saw her 6 years ago in our office for evaluation of a fast heart rate which was worked up and ultimately resolved. I last saw her in the office 05/11/15.Her cardiac risk factor profile is notable for hypertension but otherwise are negative. For the last 3 weeks prior to her last office visit she had noticed substernal chest pain radiating to both upper extremities occurring on a daily basis. She does have reflux but she says the symptoms are different. I ordered a Myoview stress test which was entirely normal. Since I saw her in the office a year ago her chest pain has resolved. She has gone on hemodialysis one year ago. 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 She did have paroxysmal atrial fibrillation demonstrated in the emergency room after dialysis converting with IV diltiazem. She remains in normal sinus rhythm on aspirin alone   Current Outpatient Prescriptions  Medication Sig Dispense Refill  . acetaminophen (TYLENOL) 500 MG tablet Take 1,000 mg by mouth daily as needed for mild pain, moderate pain or headache.     Marland Kitchen amoxicillin (AMOXIL) 500 MG capsule Take 2,000 mg by mouth See admin instructions. Only for dental procedures    . aspirin 81 MG tablet Take 81 mg by mouth daily.    . Darbepoetin Alfa-Albumin (ARANESP IJ) Inject as directed every 30 (thirty) days.    Marland Kitchen docusate sodium (COLACE) 100 MG capsule Take 100 mg by mouth 2 (two) times daily as needed for mild constipation.    Marland Kitchen ethyl chloride spray Apply 1 application topically as needed (Before Dialysis).    Marland Kitchen lidocaine-prilocaine (EMLA) cream Apply 1 application topically See admin instructions. Pt to apply cream each  day before dialysis, Mon, Wed, Fri  6  . metoprolol tartrate (LOPRESSOR) 25 MG tablet Take 1 tablet (25 mg total) by mouth 2 (two) times daily. 60 tablet 3  . multivitamin (RENA-VIT) TABS tablet Take 1 tablet by mouth daily.    . pantoprazole (PROTONIX) 40 MG tablet TAKE 1 TABLET (40 MG TOTAL) BY MOUTH DAILY. 30 tablet 6  . polyethylene glycol (MIRALAX / GLYCOLAX) packet Take 17 g by mouth daily as needed for mild constipation.    . predniSONE (DELTASONE) 5 MG tablet Take 1 tablet by mouth every other day.   3  . SENSIPAR 30 MG tablet Take 30 mg by mouth every evening.   6  . sevelamer carbonate (RENVELA) 800 MG tablet Take 800-2,400 mg by mouth See admin instructions. 3 tablets (2400 mg) three (3) times a day with meals and 1 tablet (800 mg) twice daily with snacks    . tacrolimus (PROGRAF) 1 MG capsule Take 1 mg by mouth 2 (two) times daily.   10   No current facility-administered medications for this visit.     No Known Allergies  Social History   Social History  . Marital status: Married    Spouse name: N/A  . Number of children: 2  . Years of education: N/A   Occupational History  . [employer]   Social History Main Topics  . Smoking status: Never Smoker  . Smokeless tobacco: Never Used  . Alcohol use No  . Drug  use: No  . Sexual activity: Yes    Birth control/ protection: None     Comment: Hysterectomy   Other Topics Concern  . Not on file   Social History Narrative  . No narrative on file     Review of Systems: General: negative for chills, fever, night sweats or weight changes.  Cardiovascular: negative for chest pain, dyspnea on exertion, edema, orthopnea, palpitations, paroxysmal nocturnal dyspnea or shortness of breath Dermatological: negative for rash Respiratory: negative for cough or wheezing Urologic: negative for hematuria Abdominal: negative for nausea, vomiting, diarrhea, bright red blood per rectum, melena, or  hematemesis Neurologic: negative for visual changes, syncope, or dizziness All other systems reviewed and are otherwise negative except as noted above.    Blood pressure (!) 92/58, pulse 73, height 5\' 1"  (1.549 m), weight 203 lb 3.2 oz (92.2 kg).  General appearance: alert and no distress Neck: no adenopathy, no carotid bruit, no JVD, supple, symmetrical, trachea midline and thyroid not enlarged, symmetric, no tenderness/mass/nodules Lungs: clear to auscultation bilaterally Heart: regular rate and rhythm, S1, S2 normal, no murmur, click, rub or gallop Extremities: extremities normal, atraumatic, no cyanosis or edema  EKG normal sinus rhythm at 73 without ST or T-wave changes. I Personally reviewed this EKG  ASSESSMENT AND PLAN:   Paroxysmal atrial fibrillation (HCC) History of paroxysmal atrial fibrillation maintaining sinus rhythm on low-dose beta blocker. The CHA2DSVASC2 score is  1 and therefore we've elected to keep her on aspirin alone at this time. Lorretta Harp MD FACP,FACC,FAHA, [facility] 02/15/2016 11:46 AM

## 2016-02-15 NOTE — Assessment & Plan Note (Signed)
History of paroxysmal atrial fibrillation maintaining sinus rhythm on low-dose beta blocker. The CHA2DSVASC2 score is  1 and therefore we've elected to keep her on aspirin alone at this time. Karen [name]

## 2016-02-29 ENCOUNTER — Ambulatory Visit: Payer: 59 | Admitting: Cardiovascular Disease

## 2016-03-08 ENCOUNTER — Other Ambulatory Visit: Payer: Self-pay | Admitting: Cardiovascular Disease

## 2016-04-07 ENCOUNTER — Other Ambulatory Visit: Payer: Self-pay | Admitting: Cardiology

## 2016-11-16 ENCOUNTER — Ambulatory Visit (INDEPENDENT_AMBULATORY_CARE_PROVIDER_SITE_OTHER): Payer: 59 | Admitting: Cardiology

## 2016-11-16 ENCOUNTER — Encounter: Payer: Self-pay | Admitting: Cardiology

## 2016-11-16 VITALS — BP 110/78 | HR 78 | Ht 61.0 in | Wt 205.0 lb

## 2016-11-16 DIAGNOSIS — N186 End stage renal disease: Secondary | ICD-10-CM | POA: Diagnosis not present

## 2016-11-16 DIAGNOSIS — Z87898 Personal history of other specified conditions: Secondary | ICD-10-CM | POA: Diagnosis not present

## 2016-11-16 DIAGNOSIS — E785 Hyperlipidemia, unspecified: Secondary | ICD-10-CM

## 2016-11-16 DIAGNOSIS — I480 Paroxysmal atrial fibrillation: Secondary | ICD-10-CM

## 2016-11-16 DIAGNOSIS — Z992 Dependence on renal dialysis: Secondary | ICD-10-CM | POA: Diagnosis not present

## 2016-11-16 MED ORDER — SIMVASTATIN 20 MG PO TABS: 20.0000 mg | ORAL_TABLET | Freq: Every day | ORAL | 11 refills | Status: DC

## 2016-11-16 NOTE — Assessment & Plan Note (Signed)
She had an episode in May 2017 on HD and again documented atrial fibrillation with RVR with chest pain in ED 11/24/15. She converted to NSR after Diltiazem.she has not had recurrent PAF. She is a CHADs VASc =1 and is on ASA

## 2016-11-16 NOTE — Assessment & Plan Note (Signed)
She has had a negative Dobutamine stress, a negative Myoview, and a low risk coronary CTA-showing minor CAD Dec 2017.

## 2016-11-16 NOTE — Assessment & Plan Note (Signed)
HD MWF- Dr Marval Regal follows

## 2016-11-16 NOTE — Progress Notes (Signed)
11/16/2016 Karen [name]   [birth year]  [id number]  Primary Physician Lucianne Lei, MD Primary Cardiologist: Dr Gwenlyn Found  HPI:  Pleasant 60 y/o AA female with a history of renal disease. 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 She has a history of chest pain. Dobutamine stress echo at [facility] Feb 2017 was negative and a Lexiscan May 2017 was negative. Coronary CTA done Dec 2017 showed minor CAD.   In May 2017 she had presented with suspected PAF though I don't think it was documented. She was watched overnight on telemetry and discharged. She was seen again in [facility] 11/24/15, this time with rapid AF and chest pain while on dialysis. She converted to NSR in the ED after Diltiazem. She has done well on low dose beta blocker since, apparently maintaining NSR. She is not on anticoagulation, low dose ASA only. She last saw Dr Gwenlyn Found Jan 2018 and was doing well.   She is in the office today for a routine follow up. She continues to do well, no unusual chest pain or tachycardia. Her Lopressor was cut back to 25 mg QHS secondary to hypotension on HD.      Current Outpatient Prescriptions  Medication Sig Dispense Refill  . acetaminophen (TYLENOL) 500 MG tablet Take 1,000 mg by mouth daily as needed for mild pain, moderate pain or headache.     Marland Kitchen amoxicillin (AMOXIL) 500 MG capsule Take 2,000 mg by mouth See admin instructions. Only for dental procedures    . aspirin 81 MG tablet Take 81 mg by mouth daily.    . Darbepoetin Alfa-Albumin (ARANESP IJ) Inject as directed every 30 (thirty) days.    Marland Kitchen docusate sodium (COLACE) 100 MG capsule Take 100 mg by mouth 2 (two) times daily as needed for mild constipation.    Marland Kitchen ethyl chloride spray Apply 1 application topically as needed (Before Dialysis).    Marland Kitchen lidocaine-prilocaine (EMLA) cream Apply 1 application topically See admin instructions. Pt to apply cream each day before dialysis, Mon, Wed, Fri  6  . metoprolol tartrate  (LOPRESSOR) 25 MG tablet Take 25 mg by mouth at bedtime.    . multivitamin (RENA-VIT) TABS tablet Take 1 tablet by mouth daily.    . pantoprazole (PROTONIX) 40 MG tablet TAKE 1 TABLET (40 MG TOTAL) BY MOUTH DAILY. 30 tablet 10  . polyethylene glycol (MIRALAX / GLYCOLAX) packet Take 17 g by mouth daily as needed for mild constipation.    . sevelamer carbonate (RENVELA) 800 MG tablet Take 800-2,400 mg by mouth See admin instructions. 3 tablets (2400 mg) three (3) times a day with meals and 1 tablet (800 mg) twice daily with snacks    . simvastatin (ZOCOR) 20 MG tablet Take 1 tablet (20 mg total) by mouth at bedtime. 30 tablet 11   No current facility-administered medications for this visit.     No Known Allergies  Past Medical History:  Diagnosis Date  . Anemia   . Arthritis    knees  . Chest pain Jan 2016   low risk Myoview   . Complication of anesthesia 2005   difficulty remembering for a while  . Constipation   . ESRD on dialysis [facility]) April 2016   MWF  . GERD (gastroesophageal reflux disease)   . Heart murmur Nov 2015   Aortic scleosis- no stenosis  . Hypertension   . Shortness of breath dyspnea    with exertion    Social History  Social History  . Marital status: Married    Spouse name: N/A  . Number of children: 2  . Years of education: N/A   Occupational History  . [employer]   Social History Main Topics  . Smoking status: Never Smoker  . Smokeless tobacco: Never Used  . Alcohol use No  . Drug use: No  . Sexual activity: Yes    Birth control/ protection: None     Comment: Hysterectomy   Other Topics Concern  . Not on file   Social History Narrative  . No narrative on file     Family History  Problem Relation Age of Onset  . Kidney disease Mother   . Heart attack Father   . Kidney disease Father   . Diabetes Sister   . Hyperlipidemia Sister   . Hypertension Sister   . Kidney disease Sister        x2     Review of  Systems: General: negative for chills, fever, night sweats or weight changes.  Cardiovascular: negative for chest pain, dyspnea on exertion, edema, orthopnea, palpitations, paroxysmal nocturnal dyspnea or shortness of breath Dermatological: negative for rash Respiratory: negative for cough or wheezing Urologic: negative for hematuria Abdominal: negative for nausea, vomiting, diarrhea, bright red blood per rectum, melena, or hematemesis Neurologic: negative for visual changes, syncope, or dizziness All other systems reviewed and are otherwise negative except as noted above.    Blood pressure 110/78, pulse 78, height 5\' 1"  (1.549 m), weight 205 lb (93 kg).  General appearance: alert, cooperative, no distress and mildly obese Neck: no carotid bruit and no JVD Lungs: clear to auscultation bilaterally Heart: regular rate and rhythm and soft sytsolic murmur AOV and LSB Skin: Skin color, texture, turgor normal. No rashes or lesions Neurologic: Grossly normal  EKG NSR  ASSESSMENT AND PLAN:   History of chest pain She has had a negative Dobutamine stress, a negative Myoview, and a low risk coronary CTA-showing minor CAD Dec 2017.  Paroxysmal atrial fibrillation [facility]) She had an episode in May 2017 on HD and again documented atrial fibrillation with RVR with chest pain in ED 11/24/15. She converted to NSR after Diltiazem.she has not had recurrent PAF. She is a CHADs VASc =1 and is on ASA  End stage renal disease on dialysis [facility]) HD MWF- Dr Marval Regal follows   PLAN  Karen [name] does have minor CAD. Her LDL in May 2017 was 97. I suggested she take a low dose statin and started Zocor 20 mg. She can f/u with Dr Gwenlyn Found in a year.   Kerin Ransom PA-C 11/16/2016 3:45 PM

## 2016-11-16 NOTE — Patient Instructions (Signed)
Medication Instructions:  START- Simvastatin 20 mg daily  If you need a refill on your cardiac medications before your next appointment, please call your pharmacy.  Labwork: None Ordered   Testing/Procedures: None Ordered  Follow-Up: Your physician wants you to follow-up in: 1 year with Dr Gwenlyn Found. You should receive a reminder letter in the mail two months in advance. If you do not receive a letter, please call our office [phone].    Thank you for choosing [facility]!!

## 2016-12-29 ENCOUNTER — Emergency Department (HOSPITAL_COMMUNITY)
Admission: EM | Admit: 2016-12-29 | Discharge: 2016-12-29 | Disposition: A | Discharge status: Home / Self Care | Payer: 59 | Attending: Emergency Medicine | Admitting: Emergency Medicine

## 2016-12-29 ENCOUNTER — Other Ambulatory Visit: Payer: Self-pay

## 2016-12-29 ENCOUNTER — Emergency Department (HOSPITAL_COMMUNITY): Payer: 59

## 2016-12-29 DIAGNOSIS — R5383 Other fatigue: Secondary | ICD-10-CM | POA: Insufficient documentation

## 2016-12-29 DIAGNOSIS — R002 Palpitations: Secondary | ICD-10-CM | POA: Diagnosis not present

## 2016-12-29 DIAGNOSIS — I120 Hypertensive chronic kidney disease with stage 5 chronic kidney disease or end stage renal disease: Secondary | ICD-10-CM | POA: Diagnosis not present

## 2016-12-29 DIAGNOSIS — I480 Paroxysmal atrial fibrillation: Secondary | ICD-10-CM | POA: Insufficient documentation

## 2016-12-29 DIAGNOSIS — I4891 Unspecified atrial fibrillation: Secondary | ICD-10-CM | POA: Diagnosis present

## 2016-12-29 DIAGNOSIS — Z79899 Other long term (current) drug therapy: Secondary | ICD-10-CM | POA: Diagnosis not present

## 2016-12-29 DIAGNOSIS — Z7982 Long term (current) use of aspirin: Secondary | ICD-10-CM | POA: Diagnosis not present

## 2016-12-29 DIAGNOSIS — Z992 Dependence on renal dialysis: Secondary | ICD-10-CM | POA: Insufficient documentation

## 2016-12-29 DIAGNOSIS — N186 End stage renal disease: Secondary | ICD-10-CM | POA: Diagnosis not present

## 2016-12-29 LAB — BASIC METABOLIC PANEL
ANION GAP: 9 (ref 5–15)
BUN: 12 mg/dL (ref 6–20)
CHLORIDE: 100 mmol/L — AB (ref 101–111)
CO2: 31 mmol/L (ref 22–32)
Calcium: 8.8 mg/dL — ABNORMAL LOW (ref 8.9–10.3)
Creatinine, Ser: 4.05 mg/dL — ABNORMAL HIGH (ref 0.44–1.00)
GFR calc Af Amer: 13 mL/min — ABNORMAL LOW (ref >60)
GFR calc non Af Amer: 11 mL/min — ABNORMAL LOW (ref >60)
GLUCOSE: 130 mg/dL — AB (ref 65–99)
POTASSIUM: 3.4 mmol/L — AB (ref 3.5–5.1)
Sodium: 140 mmol/L (ref 135–145)

## 2016-12-29 LAB — CBC
HEMATOCRIT: 36.4 % (ref 36.0–46.0)
Hemoglobin: 12.2 g/dL (ref 12.0–15.0)
MCH: 32.4 pg (ref 26.0–34.0)
MCHC: 33.5 g/dL (ref 30.0–36.0)
MCV: 96.8 fL (ref 78.0–100.0)
Platelets: 173 10*3/uL (ref 150–400)
RBC: 3.76 MIL/uL — ABNORMAL LOW (ref 3.87–5.11)
RDW: 15 % (ref 11.5–15.5)
WBC: 3 10*3/uL — ABNORMAL LOW (ref 4.0–10.5)

## 2016-12-29 LAB — I-STAT TROPONIN, ED: Troponin i, poc: 0.02 ng/mL (ref 0.00–0.08)

## 2016-12-29 MED ORDER — METOPROLOL TARTRATE 5 MG/5ML IV SOLN: 5.0000 mg | Freq: Once | INTRAVENOUS | Status: DC

## 2016-12-29 MED ORDER — DILTIAZEM HCL 25 MG/5ML IV SOLN: 10.0000 mg | Freq: Once | INTRAVENOUS | Status: DC

## 2016-12-29 MED ORDER — ACETAMINOPHEN 500 MG PO TABS
1000.0000 mg | ORAL_TABLET | Freq: Once | ORAL | Status: AC
  Administered 2016-12-29: 1000 mg via ORAL
  Filled 2016-12-29: qty 2

## 2016-12-29 MED ORDER — METOPROLOL TARTRATE 25 MG PO TABS
25.0000 mg | ORAL_TABLET | Freq: Once | ORAL | Status: AC
Start: 2016-12-29 — End: 2016-12-29
  Administered 2016-12-29: 25 mg via ORAL
  Filled 2016-12-29: qty 1

## 2016-12-29 MED ORDER — DEXTROSE 5 % IV SOLN
5.0000 mg/h | Freq: Once | INTRAVENOUS | Status: DC
  Filled 2016-12-29: qty 100

## 2016-12-29 NOTE — ED Triage Notes (Signed)
Patient was in hour 3 of 4 at dialysis center when a "fluttering" heartbeat was felt by patient and observed by staff.

## 2016-12-29 NOTE — ED Notes (Signed)
Per MD, hold metoprolol at this time.

## 2016-12-29 NOTE — ED Provider Notes (Signed)
[facility] EMERGENCY DEPARTMENT Provider Note   CSN: [id number] Arrival date & time: 12/29/16  1603     History   Chief Complaint Chief Complaint  Patient presents with  . Atrial Fibrillation    HPI Karen [name] is a 61 y.o. female.  HPI 79yoF with hx of ESRD on HD MWF, HTN, Arthritis, and chronic anemia presenting from dialysis for palpitations.  She states that after 3 of her normal 4 hours of dialysis she started experiencing heart fluttering.  Her heart rate was checked by the dialysis nurses and she was noted to be tachycardic.  She complained of generalized fatigue but otherwise denied chest pain or shortness of breath, lightheadedness or dizziness.  She states that she has intermittent A. fib that often times happens during dialysis however earlier this week she had 2 episodes that resolved after a few hours.  She states that she is not on anticoagulation and is only on 81 mg aspirin.  Denies recent fevers, vomiting, diarrhea, dysuria, cough or sputum production. Given 324 ASA by EMS.  Past Medical History:  Diagnosis Date  . Anemia   . Arthritis    knees  . Chest pain Jan 2016   low risk Myoview   . Complication of anesthesia 2005   difficulty remembering for a while  . Constipation   . ESRD on dialysis [facility]) April 2016   MWF  . GERD (gastroesophageal reflux disease)   . Heart murmur Nov 2015   Aortic scleosis- no stenosis  . Hypertension   . Shortness of breath dyspnea    with exertion    Patient Active Problem List   Diagnosis Date Noted  . Elevated troponin I level 06/07/2015  . Paroxysmal atrial fibrillation ([place]) 06/07/2015  . Klebsiella pneumoniae sepsis ([place]) 12/22/2014  . Sinus tachycardia 12/21/2014  . Dyspnea 12/21/2014  . Infection of urinary tract 12/20/2014  . Sepsis ([place]) 12/19/2014  . Fever, unspecified 12/17/2014  . Thrush 12/17/2014  . Erythema induratum 12/17/2014  . Obesity, Class II, BMI 35.0-39.9, with  comorbidity (see actual BMI) 11/19/2014  . Bilateral lower extremity edema 04/28/2014  . Chronic constipation 05/26/2013  . Dyspepsia 08/08/2012  . Chronic nausea 08/08/2012  . History of chest pain 06/12/2012  . End stage renal disease on dialysis [facility]) 07/04/2011    Past Surgical History:  Procedure Laterality Date  . ABDOMINAL HYSTERECTOMY  2005  . AV FISTULA PLACEMENT Left 12/09/2013   Procedure: INSERTION OF ARTERIOVENOUS (AV) GORE-TEX GRAFT ARM;  Surgeon: Elam Dutch, MD;  Location: [facility];  Service: Vascular;  Laterality: Left;  . BUNIONECTOMY Bilateral   . CHOLECYSTECTOMY  2005  . COLONOSCOPY    . IR GENERIC HISTORICAL  01/11/2016   IR US GUIDE VASC ACCESS RIGHT 01/11/2016 Corrie Mckusick, DO [facility]-INTERV RAD  . IR GENERIC HISTORICAL  01/11/2016   IR RADIOLOGY PERIPHERAL GUIDED IV START 01/11/2016 Corrie Mckusick, DO [facility]INTER[facility]  . 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 KNEE ARTHROSCOPY Bilateral   . PERIPHERAL VASCULAR CATHETERIZATION N/A 08/31/2015   Procedure: A/V Shuntogram;  Surgeon: Serafina Mitchell, MD;  Location: [place] CV LAB;  Service: Cardiovascular;  Laterality: N/A;  . PERIPHERAL VASCULAR CATHETERIZATION Left 08/31/2015   Procedure: Peripheral Vascular Balloon Angioplasty;  Surgeon: Serafina Mitchell, MD;  Location: [place] CV LAB;  Service: Cardiovascular;  Laterality: Left;  arm fistula  . ROTATOR CUFF REPAIR Right 1997    OB History    No data available  Home Medications    Prior to Admission medications   Medication Sig Start Date End Date Taking? Authorizing Provider  acetaminophen (TYLENOL) 500 MG tablet Take 1,000 mg by mouth daily as needed for mild pain, moderate pain or headache.    Yes [provider]  amoxicillin (AMOXIL) 500 MG capsule Take 2,000 mg by mouth See admin instructions. Take 4 capsules (2000 mg) by mouth one hour prior to dental procedures   Yes [provider]  aspirin EC 81 MG tablet Take 81 mg by  mouth daily.   Yes [provider]  Darbepoetin Alfa-Albumin (ARANESP IJ) Inject as directed every 30 (thirty) days.   Yes [provider]  docusate sodium (COLACE) 100 MG capsule Take 100 mg by mouth 2 (two) times daily as needed for mild constipation.   Yes [provider]  lidocaine-prilocaine (EMLA) cream Apply 1 application topically See admin instructions. Apply topically Monday, Wednesday and Friday before dialysis 05/26/15  Yes [provider]  metoprolol tartrate (LOPRESSOR) 25 MG tablet Take 25 mg by mouth at bedtime.   Yes [provider]  multivitamin (RENA-VIT) TABS tablet Take 1 tablet by mouth daily.   Yes [provider]  pantoprazole (PROTONIX) 40 MG tablet TAKE 1 TABLET (40 MG TOTAL) BY MOUTH DAILY. 03/08/16  Yes Lorretta Harp, MD  polyethylene glycol [facility] / Floria Raveling) packet Take 17 g by mouth daily as needed for mild constipation. Mix in 8 oz liquid and drink   Yes [provider]  sevelamer carbonate (RENVELA) 800 MG tablet Take 1,600 mg by mouth 3 (three) times daily with meals.    Yes [provider]  simvastatin (ZOCOR) 20 MG tablet Take 1 tablet (20 mg total) by mouth at bedtime. 11/16/16 02/14/17 Yes KilroyDoreene Burke, PA-C    Family History Family History  Problem Relation Age of Onset  . Kidney disease Mother   . Heart attack Father   . Kidney disease Father   . Diabetes Sister   . Hyperlipidemia Sister   . Hypertension Sister   . Kidney disease Sister        x2    Social History Social History   Tobacco Use  . Smoking status: Never Smoker  . Smokeless tobacco: Never Used  Substance Use Topics  . Alcohol use: No  . Drug use: No     Allergies   Patient has no known allergies.   Review of Systems Review of Systems  Constitutional: Positive for fatigue. Negative for chills and fever.  HENT: Negative for ear pain and sore throat.   Eyes: Negative for pain and visual  disturbance.  Respiratory: Negative for cough and shortness of breath.   Cardiovascular: Positive for palpitations. Negative for chest pain.  Gastrointestinal: Negative for abdominal pain and vomiting.  Genitourinary: Negative for dysuria and hematuria.  Musculoskeletal: Negative for arthralgias and back pain.  Skin: Negative for color change and rash.  Neurological: Negative for seizures and syncope.  All other systems reviewed and are negative.    Physical Exam Updated Vital Signs BP 121/61   Pulse 94   Resp 18   Ht 5\' 1"  (1.549 m)   Wt 93.4 kg (206 lb)   SpO2 99%   BMI 38.92 kg/m   Physical Exam  Constitutional: She is oriented to person, place, and time. She appears well-developed and well-nourished. No distress.  HENT:  Head: Normocephalic and atraumatic.  Eyes: Conjunctivae and EOM are normal. Pupils are equal, round, and reactive  to light.  Neck: Normal range of motion. Neck supple.  Cardiovascular: Normal heart sounds, intact distal pulses and normal pulses. An irregularly irregular rhythm present. Tachycardia present.  No murmur heard. Pulmonary/Chest: Effort normal and breath sounds normal. No tachypnea. No respiratory distress. She has no decreased breath sounds.  Abdominal: Soft. There is no tenderness.  Musculoskeletal: She exhibits no edema.  Neurological: She is alert and oriented to person, place, and time.  Skin: Skin is warm and dry. Capillary refill takes less than 2 seconds.  Psychiatric: She has a normal mood and affect.  Nursing note and vitals reviewed.    ED Treatments / Results  Labs (all labs ordered are listed, but only abnormal results are displayed) Labs Reviewed  BASIC METABOLIC PANEL - Abnormal; Notable for the following components:      Result Value   Potassium 3.4 (*)    Chloride 100 (*)    Glucose, Bld 130 (*)    Creatinine, Ser 4.05 (*)    Calcium 8.8 (*)    GFR calc non Af Amer 11 (*)    GFR calc Af Amer 13 (*)    All other  components within normal limits  CBC - Abnormal; Notable for the following components:   WBC 3.0 (*)    RBC 3.76 (*)    All other components within normal limits  I-STAT TROPONIN, ED    EKG  EKG Interpretation  Date/Time:  Friday December 29 2016 16:36:07 EST Ventricular Rate:  139 PR Interval:    QRS Duration: 90 QT Interval:  324 QTC Calculation: 493 R Axis:   -24 Text Interpretation:  Atrial flutter with predominant 2:1 AV block Borderline left axis deviation Borderline prolonged QT interval No acute changes Confirmed by Varney Biles ([id number]) on 12/29/2016 5:34:04 PM       Radiology Dg Chest 2 View  Result Date: 12/29/2016 CLINICAL DATA:  Chest pain.  Fluttering heartbeat. EXAM: CHEST  2 VIEW COMPARISON:  11/24/2015 FINDINGS: Heart is mildly enlarged. Few densities at the left lung base are suggestive for atelectasis and possibly small left pleural effusion. Upper lungs are clear without pulmonary edema. Central vascular structures are prominent, particularly in the right hilar region. Trachea is midline. A vascular graft in the left upper arm. IMPRESSION: Left basilar densities are suggestive for atelectasis and small left pleural effusion. Cardiomegaly. Electronically Signed   By: Markus Daft M.D.   On: 12/29/2016 17:41    Procedures Procedures (including critical care time)  Medications Ordered in ED Medications  metoprolol tartrate (LOPRESSOR) tablet 25 mg (25 mg Oral Given 12/29/16 1832)  acetaminophen (TYLENOL) tablet 1,000 mg (1,000 mg Oral Given 12/29/16 1832)     Initial Impression / Assessment and Plan / ED Course  I have reviewed the triage vital signs and the nursing notes.  Pertinent labs & imaging results that were available during my care of the patient were reviewed by me and considered in my medical decision making (see chart for details).     61 year old female with a history of ESRD presenting from dialysis for palpitations.  She is afebrile,  hemodynamically stable but tachycardic with an irregularly irregular rate.  EKG shows a flutter with no acute ischemic changes.  Patient denies shortness of breath or chest pain or pleuritic pain.  No signs of DVT on exam and no history of leg pain or swelling.  Compliant with her aspirin.  Chads vas score of 1 for being female.  Has prior history of hypertension however  is no longer requiring hypertensive medications since she has been on dialysis. Chest x-ray with atelectasis but no focal opacity, pneumothorax, widened mediastinum or cardiomegaly. CBC, BMP ordered.  Unremarkable other than a creatinine of 4 which appears to be her baseline related to ESRD.  Before receiving IV metoprolol the patient converted to a sinus rhythm and was not tachycardic.  She states her following resolved but she had very mild tightness in her chest.  Troponin ordered however low suspicion for ACS.  No signs of DVT or PE.  No obvious infectious symptoms.  No history of thyroid issues. troponin negative.  Will give dose of oral metoprolol and Tylenol for her mild headache that has been present for a week.  Nonfocal neuro exam.  As she has a chads Vasc of 1, no need for anticoagulation.  As she has converted to sinus rhythm seen on EKG and is no longer having palpitations, we will have the patient follow-up in A. fib clinic.  Final Clinical Impressions(s) / ED Diagnoses   Final diagnoses:  Paroxysmal atrial fibrillation [facility])    ED Discharge Orders    None       Sarabella Caprio [place], MD 12/29/16 [place], [place], MD 12/29/16 2356

## 2017-01-04 ENCOUNTER — Ambulatory Visit (HOSPITAL_COMMUNITY)
Admission: RE | Admit: 2017-01-04 | Discharge: 2017-01-04 | Disposition: A | Discharge status: Home / Self Care | Payer: 59 | Source: Ambulatory Visit | Attending: Nurse Practitioner | Admitting: Nurse Practitioner

## 2017-01-04 ENCOUNTER — Encounter (HOSPITAL_COMMUNITY): Payer: Self-pay | Admitting: Nurse Practitioner

## 2017-01-04 VITALS — BP 132/76 | Ht 61.0 in | Wt 199.0 lb

## 2017-01-04 DIAGNOSIS — I1311 Hypertensive heart and chronic kidney disease without heart failure, with stage 5 chronic kidney disease, or end stage renal disease: Secondary | ICD-10-CM | POA: Insufficient documentation

## 2017-01-04 DIAGNOSIS — N186 End stage renal disease: Secondary | ICD-10-CM | POA: Insufficient documentation

## 2017-01-04 DIAGNOSIS — I480 Paroxysmal atrial fibrillation: Secondary | ICD-10-CM | POA: Insufficient documentation

## 2017-01-04 DIAGNOSIS — Z992 Dependence on renal dialysis: Secondary | ICD-10-CM | POA: Diagnosis not present

## 2017-01-04 DIAGNOSIS — Z9049 Acquired absence of other specified parts of digestive tract: Secondary | ICD-10-CM | POA: Diagnosis not present

## 2017-01-04 DIAGNOSIS — K219 Gastro-esophageal reflux disease without esophagitis: Secondary | ICD-10-CM | POA: Diagnosis not present

## 2017-01-04 DIAGNOSIS — Z79899 Other long term (current) drug therapy: Secondary | ICD-10-CM | POA: Diagnosis not present

## 2017-01-04 DIAGNOSIS — Z940 Kidney transplant status: Secondary | ICD-10-CM | POA: Insufficient documentation

## 2017-01-04 DIAGNOSIS — Z7982 Long term (current) use of aspirin: Secondary | ICD-10-CM | POA: Diagnosis not present

## 2017-01-04 DIAGNOSIS — I4891 Unspecified atrial fibrillation: Secondary | ICD-10-CM | POA: Diagnosis present

## 2017-01-04 MED ORDER — DILTIAZEM HCL 30 MG PO TABS: ORAL_TABLET | ORAL | 1 refills | Status: DC

## 2017-01-04 NOTE — Patient Instructions (Signed)
Cardizem 30mg  -- take 1/2  tablet every 4 hours AS NEEDED for AFIB heart rate >100 as long as top number of blood pressure >100.

## 2017-01-04 NOTE — Progress Notes (Signed)
Primary Care Physician: Lucianne Lei, MD Referring Physician: [facility] ER f/u Cardiologist: Dr. Mattie Marlin Karen [name] is a 61 y.o. female with a h/o ESRD on dialysis, HTN, that has been having a few spells of palpitations/afib during dialysis. She was seen in the ED 11/8 and 12 /14 for same. She converted in the ER after just a few mins after arrival but afib was documented. She is on ASA for a chadsvasc score of 1. She is on metoprolol 25 mg at hs only because her BP drops with dialysis if she takes her am dose. She has only had the issue with dialysis.  Today, she denies symptoms of palpitations, chest pain, shortness of breath, orthopnea, PND, lower extremity edema, dizziness, presyncope, syncope, or neurologic sequela. The patient is tolerating medications without difficulties and is otherwise without complaint today.   Past Medical History:  Diagnosis Date  . Anemia   . Arthritis    knees  . Chest pain Jan 2016   low risk Myoview   . Complication of anesthesia 2005   difficulty remembering for a while  . Constipation   . ESRD on dialysis [facility]) April 2016   MWF  . GERD (gastroesophageal reflux disease)   . Heart murmur Nov 2015   Aortic scleosis- no stenosis  . Hypertension   . Shortness of breath dyspnea    with exertion   Past Surgical History:  Procedure Laterality Date  . ABDOMINAL HYSTERECTOMY  2005  . AV FISTULA PLACEMENT Left 12/09/2013   Procedure: INSERTION OF ARTERIOVENOUS (AV) GORE-TEX GRAFT ARM;  Surgeon: Elam Dutch, MD;  Location: [facility];  Service: Vascular;  Laterality: Left;  . BUNIONECTOMY Bilateral   . CHOLECYSTECTOMY  2005  . COLONOSCOPY    . IR GENERIC HISTORICAL  01/11/2016   IR US GUIDE VASC ACCESS RIGHT 01/11/2016 Corrie Mckusick, DO [facility]-INTER[facility] RAD  . IR GENERIC HISTORICAL  01/11/2016   IR RADIOLOGY PERIPHERAL GUIDED IV START 01/11/2016 Corrie Mckusick, DO [facility]  . 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 KNEE ARTHROSCOPY Bilateral   .  PERIPHERAL VASCULAR CATHETERIZATION N/A 08/31/2015   Procedure: A/V Shuntogram;  Surgeon: Serafina Mitchell, MD;  Location: [place] CV LAB;  Service: Cardiovascular;  Laterality: N/A;  . PERIPHERAL VASCULAR CATHETERIZATION Left 08/31/2015   Procedure: Peripheral Vascular Balloon Angioplasty;  Surgeon: Serafina Mitchell, MD;  Location: [place] CV LAB;  Service: Cardiovascular;  Laterality: Left;  arm fistula  . ROTATOR CUFF REPAIR Right 1997    Current Outpatient Medications  Medication Sig Dispense Refill  . acetaminophen (TYLENOL) 500 MG tablet Take 1,000 mg by mouth daily as needed for mild pain, moderate pain or headache.     Marland Kitchen amoxicillin (AMOXIL) 500 MG capsule Take 2,000 mg by mouth See admin instructions. Take 4 capsules (2000 mg) by mouth one hour prior to dental procedures    . aspirin EC 81 MG tablet Take 81 mg by mouth daily.    . Darbepoetin Alfa-Albumin (ARANESP IJ) Inject as directed every 30 (thirty) days.    Marland Kitchen docusate sodium (COLACE) 100 MG capsule Take 100 mg by mouth 2 (two) times daily as needed for mild constipation.    . lidocaine-prilocaine (EMLA) cream Apply 1 application topically See admin instructions. Apply topically Monday, Wednesday and Friday before dialysis  6  . metoprolol tartrate (LOPRESSOR) 25 MG tablet Take 25 mg by mouth at bedtime.    . multivitamin (RENA-VIT) TABS tablet Take 1 tablet  by mouth daily.    . pantoprazole (PROTONIX) 40 MG tablet TAKE 1 TABLET (40 MG TOTAL) BY MOUTH DAILY. 30 tablet 10  . polyethylene glycol (MIRALAX / GLYCOLAX) packet Take 17 g by mouth daily as needed for mild constipation. Mix in 8 oz liquid and drink    . sevelamer carbonate (RENVELA) 800 MG tablet Take 1,600 mg by mouth 3 (three) times daily with meals.     . simvastatin (ZOCOR) 20 MG tablet Take 1 tablet (20 mg total) by mouth at bedtime. 30 tablet 11  . diltiazem (CARDIZEM) 30 MG tablet Take 1/2 tablet every 4 hours AS NEEDED for afib heart rate over 100 45 tablet 1    No current facility-administered medications for this encounter.     No Known Allergies  Social History   Socioeconomic History  . Marital status: Married    Spouse name: Not on file  . Number of children: 2  . Years of education: Not on file  . Highest education level: Not on file  Social Needs  . Financial resource strain: Not on file  . Food insecurity - worry: Not on file  . Food insecurity - inability: Not on file  . Transportation needs - medical: Not on file  . Transportation needs - non-medical: Not on file  Occupational History  . Occupation: SHERIFF'S OFFICE    Employer: [employer]  Tobacco Use  . Smoking status: Never Smoker  . Smokeless tobacco: Never Used  Substance and Sexual Activity  . Alcohol use: No  . Drug use: No  . Sexual activity: Yes    Birth control/protection: None    Comment: Hysterectomy  Other Topics Concern  . Not on file  Social History Narrative  . Not on file    Family History  Problem Relation Age of Onset  . Kidney disease Mother   . Heart attack Father   . Kidney disease Father   . Diabetes Sister   . Hyperlipidemia Sister   . Hypertension Sister   . Kidney disease Sister        x2    ROS- All systems are reviewed and negative except as per the HPI above  Physical Exam: Vitals:   01/04/17 1340  BP: 132/76  Weight: 199 lb (90.3 kg)  Height: 5\' 1"  (1.549 m)   Wt Readings from Last 3 Encounters:  01/04/17 199 lb (90.3 kg)  12/29/16 206 lb (93.4 kg)  11/16/16 205 lb (93 kg)    Labs: Lab Results  Component Value Date   NA 140 12/29/2016   K 3.4 (L) 12/29/2016   CL 100 (L) 12/29/2016   CO2 31 12/29/2016   GLUCOSE 130 (H) 12/29/2016   BUN 12 12/29/2016   CREATININE 4.05 (H) 12/29/2016   CALCIUM 8.8 (L) 12/29/2016   PHOS 4.6 12/17/2014   MG 2.1 11/24/2015   Lab Results  Component Value Date   INR 1.16 12/18/2014   Lab Results  Component Value Date   CHOL 165 06/08/2015   HDL 59 06/08/2015    LDLCALC 97 06/08/2015   TRIG 44 06/08/2015     GEN- The patient is well appearing, alert and oriented x 3 today.   Head- normocephalic, atraumatic Eyes-  Sclera clear, conjunctiva pink Ears- hearing intact Oropharynx- clear Neck- supple, no JVP Lymph- no cervical lymphadenopathy Lungs- Clear to ausculation bilaterally, normal work of breathing Heart- Regular rate and rhythm, no murmurs, rubs or gallops, PMI not laterally displaced GI- soft, NT, ND, + BS  Extremities- no clubbing, cyanosis, or edema MS- no significant deformity or atrophy Skin- no rash or lesion Psych- euthymic mood, full affect Neuro- strength and sensation are intact  EKG- NSR at 79 bpm, pr int 150 ms, qrs int 78 ms, qtc 438 ms Epic records reviewed    Assessment and Plan: 1. Paroxysmal afib associated with dialysis For now will give pt cardizem 30 mg as needed for afib if HR over 100 and sys BP over 100 Since she is usually at dialysis, the nurses there can help direct use with knowledge of V/S She may want to start off with 1/2 tab until it is known how it affects her BP IF this does not help, may need to discuss use of amiodarone as renal function would not allow use of other AAD's that are renally cleared For now continue with ASA for chadsvasc score of 1  F/u in one month  Butch Penny C. Cordney Barstow, [facility] [address] [place], [place] [address] [phone]

## 2017-01-11 ENCOUNTER — Ambulatory Visit (HOSPITAL_COMMUNITY): Payer: 59 | Admitting: Nurse Practitioner

## 2017-01-12 ENCOUNTER — Emergency Department (HOSPITAL_COMMUNITY)
Admission: EM | Admit: 2017-01-12 | Discharge: 2017-01-12 | Disposition: A | Discharge status: Home / Self Care | Payer: 59 | Attending: Emergency Medicine | Admitting: Emergency Medicine

## 2017-01-12 ENCOUNTER — Encounter (HOSPITAL_COMMUNITY): Payer: Self-pay | Admitting: *Deleted

## 2017-01-12 DIAGNOSIS — Z5321 Procedure and treatment not carried out due to patient leaving prior to being seen by health care provider: Secondary | ICD-10-CM | POA: Insufficient documentation

## 2017-01-12 DIAGNOSIS — R531 Weakness: Secondary | ICD-10-CM | POA: Insufficient documentation

## 2017-01-12 LAB — BASIC METABOLIC PANEL
Anion gap: 16 — ABNORMAL HIGH (ref 5–15)
BUN: 12 mg/dL (ref 6–20)
CHLORIDE: 92 mmol/L — AB (ref 101–111)
CO2: 30 mmol/L (ref 22–32)
Calcium: 9.4 mg/dL (ref 8.9–10.3)
Creatinine, Ser: 4.27 mg/dL — ABNORMAL HIGH (ref 0.44–1.00)
GFR calc Af Amer: 12 mL/min — ABNORMAL LOW (ref >60)
GFR calc non Af Amer: 10 mL/min — ABNORMAL LOW (ref >60)
GLUCOSE: 84 mg/dL (ref 65–99)
POTASSIUM: 3.9 mmol/L (ref 3.5–5.1)
Sodium: 138 mmol/L (ref 135–145)

## 2017-01-12 LAB — CBC
HEMATOCRIT: 35.7 % — AB (ref 36.0–46.0)
Hemoglobin: 11.3 g/dL — ABNORMAL LOW (ref 12.0–15.0)
MCH: 30.4 pg (ref 26.0–34.0)
MCHC: 31.7 g/dL (ref 30.0–36.0)
MCV: 96 fL (ref 78.0–100.0)
Platelets: 224 10*3/uL (ref 150–400)
RBC: 3.72 MIL/uL — ABNORMAL LOW (ref 3.87–5.11)
RDW: 14.9 % (ref 11.5–15.5)
WBC: 4.6 10*3/uL (ref 4.0–10.5)

## 2017-01-12 NOTE — ED Triage Notes (Signed)
To ED via [facility] for eval of weakness and fatigue while on dialysis. Pt took 15mg  Cardizem during dialysis due to going in and out of afib. NSR in EMS care.

## 2017-01-12 NOTE — ED Notes (Signed)
Pt states that she is leaving due to wait times  

## 2017-01-12 NOTE — ED Notes (Signed)
Pt upset b/c of wait time and that staff will not call IV team to get her labs. Pt stated she should not have to wait since she came in by EMS

## 2017-01-12 NOTE — ED Notes (Signed)
Pt requesting to leave.

## 2017-01-12 NOTE — ED Notes (Signed)
Family at bedside. 

## 2017-01-12 NOTE — ED Notes (Signed)
Pt requesting IV team to draw blood due to pt being a difficult stick triage RN aware

## 2017-01-16 DIAGNOSIS — C50919 Malignant neoplasm of unspecified site of unspecified female breast: Secondary | ICD-10-CM

## 2017-01-16 HISTORY — DX: Malignant neoplasm of unspecified site of unspecified female breast: C50.919

## 2017-01-23 ENCOUNTER — Encounter (HOSPITAL_COMMUNITY): Payer: Self-pay | Admitting: Nurse Practitioner

## 2017-01-23 ENCOUNTER — Ambulatory Visit (HOSPITAL_COMMUNITY)
Admission: RE | Admit: 2017-01-23 | Discharge: 2017-01-23 | Disposition: A | Discharge status: Home / Self Care | Payer: Medicare Other | Source: Ambulatory Visit | Attending: Nurse Practitioner | Admitting: Nurse Practitioner

## 2017-01-23 VITALS — BP 126/72 | HR 91 | Ht 61.0 in | Wt 201.6 lb

## 2017-01-23 DIAGNOSIS — Z940 Kidney transplant status: Secondary | ICD-10-CM | POA: Diagnosis not present

## 2017-01-23 DIAGNOSIS — I120 Hypertensive chronic kidney disease with stage 5 chronic kidney disease or end stage renal disease: Secondary | ICD-10-CM | POA: Diagnosis not present

## 2017-01-23 DIAGNOSIS — I480 Paroxysmal atrial fibrillation: Secondary | ICD-10-CM

## 2017-01-23 DIAGNOSIS — Z7982 Long term (current) use of aspirin: Secondary | ICD-10-CM | POA: Diagnosis not present

## 2017-01-23 DIAGNOSIS — K219 Gastro-esophageal reflux disease without esophagitis: Secondary | ICD-10-CM | POA: Insufficient documentation

## 2017-01-23 DIAGNOSIS — Z992 Dependence on renal dialysis: Secondary | ICD-10-CM | POA: Diagnosis not present

## 2017-01-23 DIAGNOSIS — N186 End stage renal disease: Secondary | ICD-10-CM | POA: Insufficient documentation

## 2017-01-23 DIAGNOSIS — Z79899 Other long term (current) drug therapy: Secondary | ICD-10-CM | POA: Insufficient documentation

## 2017-01-23 NOTE — Progress Notes (Addendum)
Primary Care Physician: Lucianne Lei, MD Referring Physician: [facility] ER f/u Cardiologist: Dr. Mattie Marlin Rezabek is a 62 y.o. female with a h/o ESRD on dialysis, failed transplant in 2016, HTN in the past but not treated for HTN currently,that has been having a  spells of palpitations during dialysis. Thuis is mentioned in her chart back to 2017. She was seen in the ED 11/24/15 and 12 /14/18 for same. She converted in the ER after just a few mins after arrival, ekg appeared topo be flutter. Ekg seen form 11/24/2015 flutter vrs fib. She is on ASA for a chadsvasc score of 1. She is on metoprolol 25 mg at hs only because her BP drops with dialysis if she takes her am dose. She has only had the issue with dialysis.  F/u in afib clinic from ER, 12/20, she was given prn 30 mg Cardizem to use during dialysis and she did use once during dialysis but did not seem to help. Her nephrologist did mention that she could try 1/2 tab of 30 mg Cardizem prior to starting dialysis.I did talk to Dr. Rayann Heman and he is willing to consider primary ablation. She does have normal heart function by echo in 2017.   Today, she denies symptoms of palpitations, chest pain, shortness of breath, orthopnea, PND, lower extremity edema, dizziness, presyncope, syncope, or neurologic sequela. The patient is tolerating medications without difficulties and is otherwise without complaint today.   Past Medical History:  Diagnosis Date  . Anemia   . Arthritis    knees  . Chest pain Jan 2016   low risk Myoview   . Complication of anesthesia 2005   difficulty remembering for a while  . Constipation   . ESRD on dialysis [facility]) April 2016   MWF  . GERD (gastroesophageal reflux disease)   . Heart murmur Nov 2015   Aortic scleosis- no stenosis  . Hypertension   . Shortness of breath dyspnea    with exertion   Past Surgical History:  Procedure Laterality Date  . ABDOMINAL HYSTERECTOMY  2005  . AV FISTULA PLACEMENT Left 12/09/2013    Procedure: INSERTION OF ARTERIOVENOUS (AV) GORE-TEX GRAFT ARM;  Surgeon: Elam Dutch, MD;  Location: [place];  Service: Vascular;  Laterality: Left;  . BUNIONECTOMY Bilateral   . CHOLECYSTECTOMY  2005  . COLONOSCOPY    . IR GENERIC HISTORICAL  01/11/2016   IR US GUIDE VASC ACCESS RIGHT 01/11/2016 Corrie Mckusick, DO [facility]-INTERV [facility]  . IR GENERIC HISTORICAL  01/11/2016   IR RADIOLOGY PERIPHERAL GUIDED IV START 01/11/2016 Corrie Mckusick, DO [facility]  . 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 KNEE ARTHROSCOPY Bilateral   . PERIPHERAL VASCULAR CATHETERIZATION N/A 08/31/2015   Procedure: A/V Shuntogram;  Surgeon: Serafina Mitchell, MD;  Location: [place] CV LAB;  Service: Cardiovascular;  Laterality: N/A;  . PERIPHERAL VASCULAR CATHETERIZATION Left 08/31/2015   Procedure: Peripheral Vascular Balloon Angioplasty;  Surgeon: Serafina Mitchell, MD;  Location: [place] CV LAB;  Service: Cardiovascular;  Laterality: Left;  arm fistula  . ROTATOR CUFF REPAIR Right 1997    Current Outpatient Medications  Medication Sig Dispense Refill  . acetaminophen (TYLENOL) 500 MG tablet Take 1,000 mg by mouth daily as needed for mild pain, moderate pain or headache.     Marland Kitchen aspirin EC 81 MG tablet Take 81 mg by mouth daily.    . Darbepoetin Alfa-Albumin (ARANESP IJ) Inject as directed every 30 (thirty) days.    Marland Kitchen  diltiazem (CARDIZEM) 30 MG tablet Take 1/2 tablet every 4 hours AS NEEDED for afib heart rate over 100 45 tablet 1  . docusate sodium (COLACE) 100 MG capsule Take 100 mg by mouth 2 (two) times daily as needed for mild constipation.    . lidocaine-prilocaine (EMLA) cream Apply 1 application topically See admin instructions. Apply topically Monday, Wednesday and Friday before dialysis  6  . metoprolol tartrate (LOPRESSOR) 25 MG tablet Take 25 mg by mouth at bedtime.    . multivitamin (RENA-VIT) TABS tablet Take 1 tablet by mouth daily.    . pantoprazole (PROTONIX) 40 MG tablet TAKE 1 TABLET (40 MG  TOTAL) BY MOUTH DAILY. 30 tablet 10  . polyethylene glycol (MIRALAX / GLYCOLAX) packet Take 17 g by mouth daily as needed for mild constipation. Mix in 8 oz liquid and drink    . sevelamer carbonate (RENVELA) 800 MG tablet Take 800-2,400 mg by mouth See admin instructions. 2,400mg  three times daily and 800mg  twice daily with snacks    . simvastatin (ZOCOR) 20 MG tablet Take 1 tablet (20 mg total) by mouth at bedtime. 30 tablet 11  . amoxicillin (AMOXIL) 500 MG capsule Take 2,000 mg by mouth See admin instructions. Take 4 capsules (2000 mg) by mouth one hour prior to dental procedures     No current facility-administered medications for this encounter.     No Known Allergies  Social History   Socioeconomic History  . Marital status: Married    Spouse name: Not on file  . Number of children: 2  . Years of education: Not on file  . Highest education level: Not on file  Social Needs  . Financial resource strain: Not on file  . Food insecurity - worry: Not on file  . Food insecurity - inability: Not on file  . Transportation needs - medical: Not on file  . Transportation needs - non-medical: Not on file  Occupational History  . Occupation: SHERIFF'S OFFICE    Employer: [employer]  Tobacco Use  . Smoking status: Never Smoker  . Smokeless tobacco: Never Used  Substance and Sexual Activity  . Alcohol use: No  . Drug use: No  . Sexual activity: Yes    Birth control/protection: None    Comment: Hysterectomy  Other Topics Concern  . Not on file  Social History Narrative  . Not on file    Family History  Problem Relation Age of Onset  . Kidney disease Mother   . Heart attack Father   . Kidney disease Father   . Diabetes Sister   . Hyperlipidemia Sister   . Hypertension Sister   . Kidney disease Sister        x2    ROS- All systems are reviewed and negative except as per the HPI above  Physical Exam: Vitals:   01/23/17 1536  BP: 126/72  Pulse: 91  Weight: 201 lb  9.6 oz (91.4 kg)  Height: 5\' 1"  (1.549 m)   Wt Readings from Last 3 Encounters:  01/23/17 201 lb 9.6 oz (91.4 kg)  01/04/17 199 lb (90.3 kg)  12/29/16 206 lb (93.4 kg)    Labs: Lab Results  Component Value Date   NA 138 01/12/2017   K 3.9 01/12/2017   CL 92 (L) 01/12/2017   CO2 30 01/12/2017   GLUCOSE 84 01/12/2017   BUN 12 01/12/2017   CREATININE 4.27 (H) 01/12/2017   CALCIUM 9.4 01/12/2017   PHOS 4.6 12/17/2014   MG 2.1 11/24/2015  Lab Results  Component Value Date   INR 1.16 12/18/2014   Lab Results  Component Value Date   CHOL 165 06/08/2015   HDL 59 06/08/2015   LDLCALC 97 06/08/2015   TRIG 44 06/08/2015     GEN- The patient is well appearing, alert and oriented x 3 today.   Head- normocephalic, atraumatic Eyes-  Sclera clear, conjunctiva pink Ears- hearing intact Oropharynx- clear Neck- supple, no JVP Lymph- no cervical lymphadenopathy Lungs- Clear to ausculation bilaterally, normal work of breathing Heart- Regular rate and rhythm, no murmurs, rubs or gallops, PMI not laterally displaced GI- soft, NT, ND, + BS Extremities- no clubbing, cyanosis, or edema MS- no significant deformity or atrophy Skin- no rash or lesion Psych- euthymic mood, full affect Neuro- strength and sensation are intact  EKG- NSR at 91 bpm, pr int 144 ms, qrs int 76 ms, qtc 462 ms Epic records reviewed Ekg's from 11/2015 and 12/2016 reviewed that documented arrythmia Echo- 06/08/15-Study Conclusions  - Left ventricle: The cavity size was normal. Wall thickness was   normal. Systolic function was normal. The estimated ejection   fraction was in the range of 55% to 60%. Wall motion was normal;   there were no regional wall motion abnormalities. Left   ventricular diastolic function parameters were normal. - Pericardium, extracardiac: A trivial pericardial effusion was   identified.   Assessment and Plan: 1. Paroxysmal afib/? flutter associated with dialysis She would only  be a candidate for amiodarone as far as AAD's 2/2 ESRD, but am concerned re side effect profile with her relatively young age. I discussed with Dr. Rayann Heman and he will see to consider primary ablation  For now continue with ASA for chadsvasc score of 1(female) Try 1/2 prn cardizem prior to dialysis to see if works any better than during daily when afib occurs Update echo  F/u in one month  Butch Penny C. Destenee Guerry, [facility] [address] [place], [place] [address] [phone]

## 2017-01-27 ENCOUNTER — Emergency Department (HOSPITAL_COMMUNITY): Payer: Medicare Other

## 2017-01-27 ENCOUNTER — Emergency Department (HOSPITAL_COMMUNITY)
Admission: EM | Admit: 2017-01-27 | Discharge: 2017-01-27 | Disposition: A | Discharge status: Home / Self Care | Payer: Medicare Other | Attending: Emergency Medicine | Admitting: Emergency Medicine

## 2017-01-27 ENCOUNTER — Other Ambulatory Visit: Payer: Self-pay

## 2017-01-27 DIAGNOSIS — N186 End stage renal disease: Secondary | ICD-10-CM | POA: Insufficient documentation

## 2017-01-27 DIAGNOSIS — Z940 Kidney transplant status: Secondary | ICD-10-CM | POA: Insufficient documentation

## 2017-01-27 DIAGNOSIS — G44219 Episodic tension-type headache, not intractable: Secondary | ICD-10-CM | POA: Diagnosis not present

## 2017-01-27 DIAGNOSIS — G44209 Tension-type headache, unspecified, not intractable: Secondary | ICD-10-CM

## 2017-01-27 DIAGNOSIS — Z7982 Long term (current) use of aspirin: Secondary | ICD-10-CM | POA: Insufficient documentation

## 2017-01-27 DIAGNOSIS — R51 Headache: Secondary | ICD-10-CM | POA: Diagnosis present

## 2017-01-27 DIAGNOSIS — Z79899 Other long term (current) drug therapy: Secondary | ICD-10-CM | POA: Insufficient documentation

## 2017-01-27 DIAGNOSIS — I120 Hypertensive chronic kidney disease with stage 5 chronic kidney disease or end stage renal disease: Secondary | ICD-10-CM | POA: Diagnosis not present

## 2017-01-27 MED ORDER — ACETAMINOPHEN 325 MG PO TABS
650.0000 mg | ORAL_TABLET | Freq: Once | ORAL | Status: AC
  Administered 2017-01-27: 650 mg via ORAL
  Filled 2017-01-27: qty 2

## 2017-01-27 NOTE — Discharge Instructions (Signed)
Your blood pressure today was mildly elevated at 155/85.  Your blood pressure should be rechecked by your doctor within the next 3 weeks.  CT scan of your brain today was normal.  It is okay to take Tylenol every 4 hours as directed for pain

## 2017-01-27 NOTE — ED Provider Notes (Signed)
[place] EMERGENCY DEPARTMENT Provider Note   CSN: [id number] Arrival date & time: 01/27/17  1024     History   Chief Complaint Chief Complaint  Patient presents with  . Headache    HPI Karen [name] is a 62 y.o. female.  Patient had sudden onset headache yesterday after argument with family member.  She reports that her daughter was involved in a domestic dispute and she was trying to help her daughter.  Another argument ensued this morning 9 AM and she developed a second headache at 9 AM sudden onset occipital.  Pain not made better or worse by anything.  She treated herself with Tylenol yesterday with relief she is not treat herself with any medicine today.  She feels that the symptoms are stress related.  No other associated symptoms she denies loss of consciousness denies chest pain or shortness of breath.  No focal numbness or weakness.  No visual changes.  HPI  Past Medical History:  Diagnosis Date  . Anemia   . Arthritis    knees  . Chest pain Jan 2016   low risk Myoview   . Complication of anesthesia 2005   difficulty remembering for a while  . Constipation   . ESRD on dialysis [facility]) April 2016   MWF  . GERD (gastroesophageal reflux disease)   . Heart murmur Nov 2015   Aortic scleosis- no stenosis  . Hypertension   . Shortness of breath dyspnea    with exertion    Patient Active Problem List   Diagnosis Date Noted  . Elevated troponin I level 06/07/2015  . Paroxysmal atrial fibrillation ([place]) 06/07/2015  . Klebsiella pneumoniae sepsis ([place]) 12/22/2014  . Sinus tachycardia 12/21/2014  . Dyspnea 12/21/2014  . Infection of urinary tract 12/20/2014  . Sepsis ([place]) 12/19/2014  . Fever, unspecified 12/17/2014  . Thrush 12/17/2014  . Erythema induratum 12/17/2014  . Obesity, Class II, BMI 35.0-39.9, with comorbidity (see actual BMI) 11/19/2014  . Bilateral lower extremity edema 04/28/2014  . Chronic constipation 05/26/2013  . Dyspepsia  08/08/2012  . Chronic nausea 08/08/2012  . History of chest pain 06/12/2012  . End stage renal disease on dialysis [facility]) 07/04/2011    Past Surgical History:  Procedure Laterality Date  . ABDOMINAL HYSTERECTOMY  2005  . AV FISTULA PLACEMENT Left 12/09/2013   Procedure: INSERTION OF ARTERIOVENOUS (AV) GORE-TEX GRAFT ARM;  Surgeon: Elam Dutch, MD;  Location: [place];  Service: Vascular;  Laterality: Left;  . BUNIONECTOMY Bilateral   . CHOLECYSTECTOMY  2005  . COLONOSCOPY    . IR GENERIC HISTORICAL  01/11/2016   IR US GUIDE VASC ACCESS RIGHT 01/11/2016 Corrie Mckusick, DO [facility]  . IR GENERIC HISTORICAL  01/11/2016   IR RADIOLOGY PERIPHERAL GUIDED IV START 01/11/2016 Corrie Mckusick, DO [facility]  . 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 KNEE ARTHROSCOPY Bilateral   . PERIPHERAL VASCULAR CATHETERIZATION N/A 08/31/2015   Procedure: A/V Shuntogram;  Surgeon: Serafina Mitchell, MD;  Location: [place] CV LAB;  Service: Cardiovascular;  Laterality: N/A;  . PERIPHERAL VASCULAR CATHETERIZATION Left 08/31/2015   Procedure: Peripheral Vascular Balloon Angioplasty;  Surgeon: Serafina Mitchell, MD;  Location: [place] CV LAB;  Service: Cardiovascular;  Laterality: Left;  arm fistula  . ROTATOR CUFF REPAIR Right 1997    OB History    No data available       Home Medications    Prior to Admission medications   Medication  Sig Start Date End Date Taking? Authorizing Provider  acetaminophen (TYLENOL) 500 MG tablet Take 1,000 mg by mouth daily as needed for mild pain, moderate pain or headache.     [provider]  amoxicillin (AMOXIL) 500 MG capsule Take 2,000 mg by mouth See admin instructions. Take 4 capsules (2000 mg) by mouth one hour prior to dental procedures    [provider]  aspirin EC 81 MG tablet Take 81 mg by mouth daily.    [provider]  Darbepoetin Alfa-Albumin (ARANESP IJ) Inject as directed every 30 (thirty) days.    [provider]  diltiazem (CARDIZEM) 30 MG tablet Take 1/2 tablet every 4 hours AS NEEDED for afib heart rate over 100 01/04/17   Sherran Needs, NP  docusate sodium (COLACE) 100 MG capsule Take 100 mg by mouth 2 (two) times daily as needed for mild constipation.    [provider]  lidocaine-prilocaine (EMLA) cream Apply 1 application topically See admin instructions. Apply topically Monday, Wednesday and Friday before dialysis 05/26/15   [provider]  metoprolol tartrate (LOPRESSOR) 25 MG tablet Take 25 mg by mouth at bedtime.    [provider]  multivitamin (RENA-VIT) TABS tablet Take 1 tablet by mouth daily.    [provider]  pantoprazole (PROTONIX) 40 MG tablet TAKE 1 TABLET (40 MG TOTAL) BY MOUTH DAILY. 03/08/16   Lorretta Harp, MD  polyethylene glycol [facility] / Floria Raveling) packet Take 17 g by mouth daily as needed for mild constipation. Mix in 8 oz liquid and drink    [provider]  sevelamer carbonate (RENVELA) 800 MG tablet Take 800-2,400 mg by mouth See admin instructions. 2,400mg  three times daily and 800mg  twice daily with snacks    [provider]  simvastatin (ZOCOR) 20 MG tablet Take 1 tablet (20 mg total) by mouth at bedtime. 11/16/16 02/14/17  Erlene Quan, PA-C    Family History Family History  Problem Relation Age of Onset  . Kidney disease Mother   . Heart attack Father   . Kidney disease Father   . Diabetes Sister   . Hyperlipidemia Sister   . Hypertension Sister   . Kidney disease Sister        x2    Social History Social History   Tobacco Use  . Smoking status: Never Smoker  . Smokeless tobacco: Never Used  Substance Use Topics  . Alcohol use: No  . Drug use: No     Allergies   Patient has no known allergies.   Review of Systems Review of Systems  Constitutional: Negative.   HENT: Negative.   Respiratory: Negative.   Cardiovascular: Negative.   Gastrointestinal: Negative.     Musculoskeletal: Negative.   Skin: Negative.   Allergic/Immunologic: Positive for immunocompromised state.       Dialysis patient  Neurological: Positive for headaches.  Psychiatric/Behavioral: Negative.   All other systems reviewed and are negative.    Physical Exam Updated Vital Signs BP (!) 144/87 (BP Location: Right Arm)   Pulse 96   Temp 98.7 F (37.1 C) (Oral)   Ht 5\' 1"  (1.549 m)   Wt 90.7 kg (200 lb)   SpO2 100%   BMI 37.79 kg/m   Physical Exam  Constitutional: She is oriented to person, place, and time. She appears well-developed and well-nourished.  HENT:  Head: Normocephalic and atraumatic.  noFacial asymmetry  Eyes: Conjunctivae are normal. Pupils are equal, round, and reactive to light.  Neck:  Neck supple. No tracheal deviation present. No thyromegaly present.  Cardiovascular: Normal rate and regular rhythm.  No murmur heard. Pulmonary/Chest: Effort normal and breath sounds normal.  Abdominal: Soft. Bowel sounds are normal. She exhibits no distension. There is no tenderness.  Musculoskeletal: Normal range of motion. She exhibits no edema or tenderness.  All 4 extremities without redness swelling or tenderness neurovascular intact.  Left upper extremity with dialysis graft with good thrill  Neurological: She is alert and oriented to person, place, and time. Coordination normal.  Gait normal Romberg normal pronator drift normal finger to nose normal DTR symmetric bilaterally at knee jerk ankle jerk and biceps toes downward going bilaterally  Skin: Skin is warm and dry. No rash noted.  Psychiatric: She has a normal mood and affect.  Nursing note and vitals reviewed.    ED Treatments / Results  Labs (all labs ordered are listed, but only abnormal results are displayed) Labs Reviewed - No data to display  EKG  EKG Interpretation None      Results for orders placed or performed during the hospital encounter of 55/73/22  Basic metabolic panel  Result  Value Ref Range   Sodium 138 135 - 145 mmol/L   Potassium 3.9 3.5 - 5.1 mmol/L   Chloride 92 (L) 101 - 111 mmol/L   CO2 30 22 - 32 mmol/L   Glucose, Bld 84 65 - 99 mg/dL   BUN 12 6 - 20 mg/dL   Creatinine, Ser 4.27 (H) 0.44 - 1.00 mg/dL   Calcium 9.4 8.9 - 10.3 mg/dL   GFR calc non Af Amer 10 (L) >60 mL/min   GFR calc Af Amer 12 (L) >60 mL/min   Anion gap 16 (H) 5 - 15  CBC  Result Value Ref Range   WBC 4.6 4.0 - 10.5 K/uL   RBC 3.72 (L) 3.87 - 5.11 MIL/uL   Hemoglobin 11.3 (L) 12.0 - 15.0 g/dL   HCT 35.7 (L) 36.0 - 46.0 %   MCV 96.0 78.0 - 100.0 fL   MCH 30.4 26.0 - 34.0 pg   MCHC 31.7 30.0 - 36.0 g/dL   RDW 14.9 11.5 - 15.5 %   Platelets 224 150 - 400 K/uL   Dg Chest 2 View  Result Date: 12/29/2016 CLINICAL DATA:  Chest pain.  Fluttering heartbeat. EXAM: CHEST  2 VIEW COMPARISON:  11/24/2015 FINDINGS: Heart is mildly enlarged. Few densities at the left lung base are suggestive for atelectasis and possibly small left pleural effusion. Upper lungs are clear without pulmonary edema. Central vascular structures are prominent, particularly in the right hilar region. Trachea is midline. A vascular graft in the left upper arm. IMPRESSION: Left basilar densities are suggestive for atelectasis and small left pleural effusion. Cardiomegaly. Electronically Signed   By: Markus Daft M.D.   On: 12/29/2016 17:41   Ct Head Wo Contrast  Result Date: 01/27/2017 CLINICAL DATA:  Headache. EXAM: CT HEAD WITHOUT CONTRAST TECHNIQUE: Contiguous axial images were obtained from the base of the skull through the vertex without intravenous contrast. COMPARISON:  None. FINDINGS: Brain: No evidence of acute infarction, hemorrhage, hydrocephalus, extra-axial collection or mass lesion/mass effect. Vascular: No hyperdense vessel or unexpected calcification. Skull: Normal. Negative for fracture or focal lesion. Sinuses/Orbits: No acute finding. Other: None. IMPRESSION: Normal head CT. Electronically Signed   By: Aletta Edouard M.D.   On: 01/27/2017 12:21   Radiology No results found.  Procedures Procedures (including critical care time)  Medications Ordered in ED Medications -  No data to display   Initial Impression / Assessment and Plan / ED Course  I have reviewed the triage vital signs and the nursing notes.  Pertinent labs & imaging results that were available during my care of the patient were reviewed by me and considered in my medical decision making (see chart for details).     12:30 PM patient resting comfortably after treatment Tylenol.  She is alert Glasgow Coma Score 15.  Plan discharge home.  Blood pressure recheck within the next 3 weeks.  Tylenol as needed for pain Headache felt to be tension headache.  Related to stress. Final Clinical Impressions(s) / ED Diagnoses  Diagnosis #1 tension headache Final diagnoses:  None  #2 elevated blood pressure  ED Discharge Orders    None       Orlie Dakin, MD 01/27/17 1235

## 2017-01-27 NOTE — ED Triage Notes (Signed)
Patient states headache x 24 hours. Tylenol resolved it last night after family argument however after another argument today headache returned. Patient had regular dialysis treatment yesterday.

## 2017-01-27 NOTE — ED Notes (Signed)
ED Provider at bedside. 

## 2017-01-27 NOTE — ED Notes (Signed)
Patient has returned from CT

## 2017-02-01 ENCOUNTER — Ambulatory Visit (HOSPITAL_COMMUNITY)
Admission: RE | Admit: 2017-02-01 | Discharge: 2017-02-01 | Disposition: A | Discharge status: Home / Self Care | Payer: Medicare Other | Source: Ambulatory Visit | Attending: Nurse Practitioner | Admitting: Nurse Practitioner

## 2017-02-01 DIAGNOSIS — I480 Paroxysmal atrial fibrillation: Secondary | ICD-10-CM | POA: Insufficient documentation

## 2017-02-01 DIAGNOSIS — I071 Rheumatic tricuspid insufficiency: Secondary | ICD-10-CM | POA: Diagnosis not present

## 2017-02-01 NOTE — Progress Notes (Signed)
  Echocardiogram 2D Echocardiogram has been performed.  Jennette Dubin 02/01/2017, 10:52 AM

## 2017-02-08 ENCOUNTER — Ambulatory Visit (HOSPITAL_COMMUNITY): Payer: 59 | Admitting: Nurse Practitioner

## 2017-02-08 ENCOUNTER — Ambulatory Visit (INDEPENDENT_AMBULATORY_CARE_PROVIDER_SITE_OTHER): Payer: Medicare Other | Admitting: Internal Medicine

## 2017-02-08 ENCOUNTER — Encounter: Payer: Self-pay | Admitting: Internal Medicine

## 2017-02-08 VITALS — BP 124/68 | HR 79 | Ht 61.0 in | Wt 197.0 lb

## 2017-02-08 DIAGNOSIS — I480 Paroxysmal atrial fibrillation: Secondary | ICD-10-CM | POA: Diagnosis not present

## 2017-02-08 NOTE — Progress Notes (Signed)
Electrophysiology Office Note   Date:  02/08/2017   ID:  Karen [name], DOB [birth year], MRN [id number]  PCP:  Lucianne Lei, MD  Cardiologist:  Dr Gwenlyn Found Primary Electrophysiologist: Thompson Grayer, MD    Chief Complaint  Patient presents with  . Atrial Fibrillation    BP sometimes drops or spikes with dialysis. HR sometimes spikes before dialysis.      History of Present Illness: Karen [name] is a 62 y.o. female who presents today for electrophysiology evaluation.   She is referred by Dr Gwenlyn Found and Roderic Palau NP for EP consultation regarding afib.  She has ESRD and is on HD.  She is s/p failed renal transplant in 2016.  She initially presented 11/24/2015 with atrial flutter/ atrial fibrillation.  She has had increasing epsodes of afib with symptomatic palpitations since that time.  She has failed medical therapy with cardizem and metoprolol.  Medical therapy has been limited by hypotension with HD. She is unaware of triggers/ precipitants.  Episodes typically last 1-2 hours.  Today, she denies symptoms of palpitations, chest pain, shortness of breath, orthopnea, PND, lower extremity edema, claudication, dizziness, presyncope, syncope, bleeding, or neurologic sequela. The patient is tolerating medications without difficulties and is otherwise without complaint today.    Past Medical History:  Diagnosis Date  . Anemia   . Arthritis    knees  . Chest pain Jan 2016   low risk Myoview   . Complication of anesthesia 2005   difficulty remembering for a while  . Constipation   . ESRD on dialysis [facility]) April 2016   MWF  . GERD (gastroesophageal reflux disease)   . Heart murmur Nov 2015   Aortic scleosis- no stenosis  . Hypertension   . Shortness of breath dyspnea    with exertion   Past Surgical History:  Procedure Laterality Date  . ABDOMINAL HYSTERECTOMY  2005  . AV FISTULA PLACEMENT Left 12/09/2013   Procedure: INSERTION OF ARTERIOVENOUS (AV) GORE-TEX GRAFT ARM;  Surgeon:  Elam Dutch, MD;  Location: [place];  Service: Vascular;  Laterality: Left;  . BUNIONECTOMY Bilateral   . CHOLECYSTECTOMY  2005  . COLONOSCOPY    . IR GENERIC HISTORICAL  01/11/2016   IR US GUIDE VASC ACCESS RIGHT 01/11/2016 Corrie Mckusick, DO [facility]  . IR GENERIC HISTORICAL  01/11/2016   IR RADIOLOGY PERIPHERAL GUIDED IV START 01/11/2016 Corrie Mckusick, DO [facility]  . 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 KNEE ARTHROSCOPY Bilateral   . PERIPHERAL VASCULAR CATHETERIZATION N/A 08/31/2015   Procedure: A/V Shuntogram;  Surgeon: Serafina Mitchell, MD;  Location: [place] CV LAB;  Service: Cardiovascular;  Laterality: N/A;  . PERIPHERAL VASCULAR CATHETERIZATION Left 08/31/2015   Procedure: Peripheral Vascular Balloon Angioplasty;  Surgeon: Serafina Mitchell, MD;  Location: [place] CV LAB;  Service: Cardiovascular;  Laterality: Left;  arm fistula  . ROTATOR CUFF REPAIR Right 1997     Current Outpatient Medications  Medication Sig Dispense Refill  . acetaminophen (TYLENOL) 500 MG tablet Take 1,000 mg by mouth daily as needed for mild pain, moderate pain or headache.     Marland Kitchen amoxicillin (AMOXIL) 500 MG capsule Take 2,000 mg by mouth See admin instructions. Take 4 capsules (2000 mg) by mouth one hour prior to dental procedures    . aspirin EC 81 MG tablet Take 81 mg by mouth daily.    . Darbepoetin Alfa-Albumin (ARANESP IJ) Inject as directed every 30 (thirty) days.    Marland Kitchen  diltiazem (CARDIZEM) 30 MG tablet Take 1/2 tablet every 4 hours AS NEEDED for afib heart rate over 100 45 tablet 1  . docusate sodium (COLACE) 100 MG capsule Take 100 mg by mouth 2 (two) times daily as needed for mild constipation.    . lidocaine-prilocaine (EMLA) cream Apply 1 application topically See admin instructions. Apply topically Monday, Wednesday and Friday before dialysis  6  . metoprolol tartrate (LOPRESSOR) 25 MG tablet Take 25 mg by mouth at bedtime.    . multivitamin (RENA-VIT) TABS tablet Take 1  tablet by mouth daily.    . pantoprazole (PROTONIX) 40 MG tablet TAKE 1 TABLET (40 MG TOTAL) BY MOUTH DAILY. 30 tablet 10  . polyethylene glycol (MIRALAX / GLYCOLAX) packet Take 17 g by mouth daily as needed for mild constipation. Mix in 8 oz liquid and drink    . sevelamer carbonate (RENVELA) 800 MG tablet Take 800-2,400 mg by mouth See admin instructions. 2,400mg  three times daily and 800mg  twice daily with snacks    . simvastatin (ZOCOR) 20 MG tablet Take 1 tablet (20 mg total) by mouth at bedtime. 30 tablet 11   No current facility-administered medications for this visit.     Allergies:   Patient has no known allergies.   Social History:  The patient  reports that  has never smoked. she has never used smokeless tobacco. She reports that she does not drink alcohol or use drugs.   Family History:  The patient's  family history includes Diabetes in her sister; Heart attack in her father; Hyperlipidemia in her sister; Hypertension in her sister; Kidney disease in her father, mother, and sister.    ROS:  Please see the history of present illness.   All other systems are personally reviewed and negative.    PHYSICAL EXAM: VS:  BP 124/68   Pulse 79   Ht 5\' 1"  (1.549 m)   Wt 197 lb (89.4 kg)   BMI 37.22 kg/m  , BMI Body mass index is 37.22 kg/m. GEN: Well nourished, well developed, in no acute distress  HEENT: normal  Neck: no JVD, carotid bruits, or masses Cardiac: RRR; no murmurs, rubs, or gallops,no edema  Respiratory:  clear to auscultation bilaterally, normal work of breathing GI: soft, nontender, nondistended, + BS MS: no deformity or atrophy  Skin: warm and dry  Neuro:  Strength and sensation are intact Psych: euthymic mood, full affect  EKG:  EKG is ordered today. The ekg ordered today is personally reviewed and shows sinus rhythm, normal ekg   Recent Labs: 01/12/2017: BUN 12; Creatinine, Ser 4.27; Hemoglobin 11.3; Platelets 224; Potassium 3.9; Sodium 138  personally  reviewed   Lipid Panel     Component Value Date/Time   CHOL 165 06/08/2015 0704   TRIG 44 06/08/2015 0704   HDL 59 06/08/2015 0704   CHOLHDL 2.8 06/08/2015 0704   VLDL 9 06/08/2015 0704   LDLCALC 97 06/08/2015 0704   personally reviewed   Wt Readings from Last 3 Encounters:  02/08/17 197 lb (89.4 kg)  01/27/17 200 lb (90.7 kg)  01/23/17 201 lb 9.6 oz (91.4 kg)      Other studies personally reviewed: Additional studies/ records that were reviewed today include: Echo 02/01/17 reveals EF 60%, mildly enlarged LA, PA systolic pressure 37 mm Hg  Review of the above records today demonstrates: as above   ASSESSMENT AND PLAN:  1.  Paroxysmal atrial fibrillation/ atrial flutter The patient has symptomatic recurrent atrial fibrillation. She has failed medical  therapy with metoprolol and cardizem.  Her medical options are limited by ESRD. Therapeutic strategies for afib including medicine (amiodarone) and ablation were discussed in detail with the patient today. Risk, benefits, and alternatives to EP study and radiofrequency ablation for afib were also discussed in detail today.  We discussed at length.  Currently, she is clear that she would prefer to continue her current approach of prn cardizem.  If her afib progresses then she may be more willing to consider amiodarone or ablation.  If she decides to proceed with ablation, would need to start coumadin and then proceed once INRs have been therapeutic for at least 3 weeks.  Would also plan TEE prior to ablation. chads2vasc score is 1.  Continue ASA  Follow-up in AF clinic in 3 months I will see as needed   Current medicines are reviewed at length with the patient today.   The patient does not have concerns regarding her medicines.  The following changes were made today:  none    Signed, Thompson Grayer, MD  02/08/2017 10:29 AM     [facility] [address] [place] [address] [phone]  (office) [phone] (fax)

## 2017-02-08 NOTE — Patient Instructions (Addendum)
Medication Instructions:  Your physician recommends that you continue on your current medications as directed. Please refer to the Current Medication list given to you today.  Labwork: None ordered.  Testing/Procedures: None ordered.  Follow-Up: Your physician wants you to follow-up in: 3 months with Roderic Palau, NP in the Afib clinic. You will receive a reminder letter in the mail two months in advance. If you don't receive a letter, please call our office to schedule the follow-up appointment.   Any Other Special Instructions Will Be Listed Below (If Applicable).     If you need a refill on your cardiac medications before your next appointment, please call your pharmacy.

## 2017-02-10 ENCOUNTER — Encounter (HOSPITAL_COMMUNITY): Payer: Self-pay | Admitting: *Deleted

## 2017-02-10 ENCOUNTER — Other Ambulatory Visit: Payer: Self-pay

## 2017-02-10 ENCOUNTER — Ambulatory Visit (HOSPITAL_COMMUNITY)
Admission: EM | Admit: 2017-02-10 | Discharge: 2017-02-10 | Disposition: A | Discharge status: Home / Self Care | Payer: Medicare Other | Attending: Family Medicine | Admitting: Family Medicine

## 2017-02-10 DIAGNOSIS — G44209 Tension-type headache, unspecified, not intractable: Secondary | ICD-10-CM

## 2017-02-10 HISTORY — DX: Dependence on renal dialysis: Z99.2

## 2017-02-10 MED ORDER — TIZANIDINE HCL 2 MG PO CAPS: 2.0000 mg | ORAL_CAPSULE | Freq: Two times a day (BID) | ORAL | 0 refills | Status: DC | PRN

## 2017-02-10 NOTE — ED Triage Notes (Addendum)
Pt is hemodialysis pt.  Reports intermittent "tension HA" and neck pain x approx 2 wks.  Was seen in ED 1/12 for same and had HTN - was told to start taking 2 metoprolol QD - pt has been doing so consistently.  C/O right-sided neck pain, esp with turning head.

## 2017-02-10 NOTE — ED Provider Notes (Signed)
[place]    CSN: [id number] Arrival date & time: 02/10/17  1248     History   Chief Complaint Chief Complaint  Patient presents with  . Neck Pain  . Headache    HPI Karen [name] is a 62 y.o. female.   HPI  Patient has a history of CKD on hemodialysis Monday Wednesday Friday here with a tension headache.  It started approximately 2 weeks ago and has been getting worse.  She has neck pain that radiates through her head on both sides.  She is only allowed to take Tylenol.  It has not been particularly helpful.  No numbness, tingling, weakness, trouble swallowing, difficulty with speech, vision changes, nausea, or vomiting.  Past Medical History:  Diagnosis Date  . Anemia   . Arthritis    knees  . Chest pain Jan 2016   low risk Myoview   . Complication of anesthesia 2005   difficulty remembering for a while  . Constipation   . ESRD on dialysis [facility]) April 2016   MWF  . GERD (gastroesophageal reflux disease)   . Heart murmur Nov 2015   Aortic scleosis- no stenosis  . Hemodialysis patient ([place])   . Hypertension   . Shortness of breath dyspnea    with exertion    Patient Active Problem List   Diagnosis Date Noted  . Elevated troponin I level 06/07/2015  . Paroxysmal atrial fibrillation ([place]) 06/07/2015  . Klebsiella pneumoniae sepsis ([place]) 12/22/2014  . Sinus tachycardia 12/21/2014  . Dyspnea 12/21/2014  . Infection of urinary tract 12/20/2014  . Sepsis ([place]) 12/19/2014  . Fever, unspecified 12/17/2014  . Thrush 12/17/2014  . Erythema induratum 12/17/2014  . Obesity, Class II, BMI 35.0-39.9, with comorbidity (see actual BMI) 11/19/2014  . Bilateral lower extremity edema 04/28/2014  . Chronic constipation 05/26/2013  . Dyspepsia 08/08/2012  . Chronic nausea 08/08/2012  . History of chest pain 06/12/2012  . End stage renal disease on dialysis [facility]) 07/04/2011    Past Surgical History:  Procedure Laterality Date  . ABDOMINAL HYSTERECTOMY  2005   . AV FISTULA PLACEMENT Left 12/09/2013   Procedure: INSERTION OF ARTERIOVENOUS (AV) GORE-TEX GRAFT ARM;  Surgeon: Elam Dutch, MD;  Location: [place];  Service: Vascular;  Laterality: Left;  . BUNIONECTOMY Bilateral   . CHOLECYSTECTOMY  2005  . COLONOSCOPY    . IR GENERIC HISTORICAL  01/11/2016   IR US GUIDE VASC ACCESS RIGHT 01/11/2016 Corrie Mckusick, DO [facility]  . IR GENERIC HISTORICAL  01/11/2016   IR RADIOLOGY PERIPHERAL GUIDED IV START 01/11/2016 Corrie Mckusick, DO [facility]  . 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 KNEE ARTHROSCOPY Bilateral   . PERIPHERAL VASCULAR CATHETERIZATION N/A 08/31/2015   Procedure: A/V Shuntogram;  Surgeon: Serafina Mitchell, MD;  Location: [place] CV LAB;  Service: Cardiovascular;  Laterality: N/A;  . PERIPHERAL VASCULAR CATHETERIZATION Left 08/31/2015   Procedure: Peripheral Vascular Balloon Angioplasty;  Surgeon: Serafina Mitchell, MD;  Location: [place] CV LAB;  Service: Cardiovascular;  Laterality: Left;  arm fistula  . ROTATOR CUFF REPAIR Right 1997   Home Medications    Prior to Admission medications   Medication Sig Start Date End Date Taking? Authorizing Provider  aspirin EC 81 MG tablet Take 81 mg by mouth daily.   Yes [provider]  Darbepoetin Alfa-Albumin (ARANESP IJ) Inject as directed every 30 (thirty) days.   Yes [provider]  diltiazem (CARDIZEM) 30 MG tablet Take  1/2 tablet every 4 hours AS NEEDED for afib heart rate over 100 01/04/17  Yes Sherran Needs, NP  metoprolol tartrate (LOPRESSOR) 25 MG tablet Take 50 mg by mouth at bedtime.    Yes [provider]  multivitamin (RENA-VIT) TABS tablet Take 1 tablet by mouth daily.   Yes [provider]  pantoprazole (PROTONIX) 40 MG tablet TAKE 1 TABLET (40 MG TOTAL) BY MOUTH DAILY. 03/08/16  Yes Lorretta Harp, MD  sevelamer carbonate (RENVELA) 800 MG tablet Take 800-2,400 mg by mouth See admin instructions. 2,400mg  three times daily  and 800mg  twice daily with snacks   Yes [provider]  simvastatin (ZOCOR) 20 MG tablet Take 1 tablet (20 mg total) by mouth at bedtime. 11/16/16 02/14/17 Yes Kilroy, Doreene Burke, PA-C  acetaminophen (TYLENOL) 500 MG tablet Take 1,000 mg by mouth daily as needed for mild pain, moderate pain or headache.     [provider]  amoxicillin (AMOXIL) 500 MG capsule Take 2,000 mg by mouth See admin instructions. Take 4 capsules (2000 mg) by mouth one hour prior to dental procedures    [provider]  docusate sodium (COLACE) 100 MG capsule Take 100 mg by mouth 2 (two) times daily as needed for mild constipation.    [provider]  lidocaine-prilocaine (EMLA) cream Apply 1 application topically See admin instructions. Apply topically Monday, Wednesday and Friday before dialysis 05/26/15   [provider]  polyethylene glycol (MIRALAX / GLYCOLAX) packet Take 17 g by mouth daily as needed for mild constipation. Mix in 8 oz liquid and drink    [provider]  tizanidine (ZANAFLEX) 2 MG capsule Take 1 capsule (2 mg total) by mouth 2 (two) times daily as needed for muscle spasms. 02/10/17   Shelda Pal, DO    Family History Family History  Problem Relation Age of Onset  . Kidney disease Mother   . Heart attack Father   . Kidney disease Father   . Diabetes Sister   . Hyperlipidemia Sister   . Hypertension Sister   . Kidney disease Sister        x2    Social History Social History   Tobacco Use  . Smoking status: Never Smoker  . Smokeless tobacco: Never Used  Substance Use Topics  . Alcohol use: No  . Drug use: No   Allergies   Patient has no known allergies.   Review of Systems Review of Systems  Constitutional: Negative for fever.  Neurological: Positive for headaches.     Physical Exam Triage Vital Signs ED Triage Vitals  Enc Vitals Group     BP 02/10/17 1436 (!) 160/74     Pulse Rate 02/10/17 1436 80     Resp  02/10/17 1436 16     Temp 02/10/17 1436 98 F (36.7 C)     Temp Source 02/10/17 1436 Oral     SpO2 02/10/17 1436 100 %   Updated Vital Signs BP (!) 160/74   Pulse 80   Temp 98 F (36.7 C) (Oral)   Resp 16   SpO2 100%   Physical Exam  Constitutional: She appears well-developed and well-nourished.  HENT:  Head: Normocephalic and atraumatic.  Eyes: Right eye exhibits normal extraocular motion. Left eye exhibits normal extraocular motion.  Cardiovascular: Normal rate and regular rhythm.  Pulmonary/Chest: Effort normal and breath sounds normal.  Musculoskeletal:  +TTP over subocc triangle b/l  Neurological: She is alert. She has normal strength. No sensory deficit.  Skin: Skin is warm and dry.  Psychiatric: She has a normal mood and affect. Her behavior is normal.     UC Treatments / Results  Procedures Procedures none  Initial Impression / Assessment and Plan / UC Course  I have reviewed the triage vital signs and the nursing notes.  Pertinent labs & imaging results that were available during my care of the patient were reviewed by me and considered in my medical decision making (see chart for details).     62 year old female presents with a tension type headache.  No red flag signs in her history or on exam.  Continues to take Tylenol.  She was provided with home stretches and exercises.  Heat/ice recommended.  Low-dose with low frequency dosing of a muscle relaxant was called and given her renal function.  Offered tramadol, however she does not tolerate it due to side effects.  If things do not improve, she will follow-up with her PCP.  She did bring up a skin complaint which I deferred to her PCP as I did not know what it is.  She voiced understanding and agreement to the plan.  Final Clinical Impressions(s) / UC Diagnoses   Final diagnoses:  Tension headache    ED Discharge Orders        Ordered    tizanidine (ZANAFLEX) 2 MG capsule  2 times daily PRN     02/10/17  1517       Controlled Substance Prescriptions [place] Controlled Substance Registry consulted? Not Applicable   Shelda Pal, [place] 02/10/17 1525

## 2017-02-10 NOTE — Discharge Instructions (Signed)
Heat (pad or rice pillow in microwave) over affected area, 10-15 minutes every 2-3 hours while awake.   Ice/cold pack over area for 10-15 min every 2-3 hours while awake.  EXERCISES RANGE OF MOTION (ROM) AND STRETCHING EXERCISES  These exercises may help you when beginning to rehabilitate your issue. In order to successfully resolve your symptoms, you must improve your posture. These exercises are designed to help reduce the forward-head and rounded-shoulder posture which contributes to this condition. Your symptoms may resolve with or without further involvement from your physician, physical therapist or athletic trainer. While completing these exercises, remember:  Restoring tissue flexibility helps normal motion to return to the joints. This allows healthier, less painful movement and activity. An effective stretch should be held for at least 20 seconds, although you may need to begin with shorter hold times for comfort. A stretch should never be painful. You should only feel a gentle lengthening or release in the stretched tissue. Do not do any stretch or exercise that you cannot tolerate.  STRETCH- Axial Extensors Lie on your back on the floor. You may bend your knees for comfort. Place a rolled-up hand towel or dish towel, about 2 inches in diameter, under the part of your head that makes contact with the floor. Gently tuck your chin, as if trying to make a "double chin," until you feel a gentle stretch at the base of your head. Hold 15-20 seconds. Repeat 2-3 times. Complete this exercise 1 time per day.   STRETCH - Axial Extension  Stand or sit on a firm surface. Assume a good posture: chest up, shoulders drawn back, abdominal muscles slightly tense, knees unlocked (if standing) and feet hip width apart. Slowly retract your chin so your head slides back and your chin slightly lowers. Continue to look straight ahead. You should feel a gentle stretch in the back of your head. Be certain not  to feel an aggressive stretch since this can cause headaches later. Hold for 15-20 seconds. Repeat 2-3 times. Complete this exercise 1 time per day.  STRETCH - Cervical Side Bend  Stand or sit on a firm surface. Assume a good posture: chest up, shoulders drawn back, abdominal muscles slightly tense, knees unlocked (if standing) and feet hip width apart. Without letting your nose or shoulders move, slowly tip your right / left ear to your shoulder until your feel a gentle stretch in the muscles on the opposite side of your neck. Hold 15-20 seconds. Repeat 2-3 times. Complete this exercise 1-2 times per day.  STRETCH - Cervical Rotators  Stand or sit on a firm surface. Assume a good posture: chest up, shoulders drawn back, abdominal muscles slightly tense, knees unlocked (if standing) and feet hip width apart. Keeping your eyes level with the ground, slowly turn your head until you feel a gentle stretch along the back and opposite side of your neck. Hold 15-20 seconds. Repeat 2-3 times. Complete this exercise 1-2 times per day.  RANGE OF MOTION - Neck Circles  Stand or sit on a firm surface. Assume a good posture: chest up, shoulders drawn back, abdominal muscles slightly tense, knees unlocked (if standing) and feet hip width apart. Gently roll your head down and around from the back of one shoulder to the back of the other. The motion should never be forced or painful. Repeat the motion 10-20 times, or until you feel the neck muscles relax and loosen. Repeat 2-3 times. Complete the exercise 1-2 times per day. STRENGTHENING EXERCISES -  Cervical Strain and Sprain These exercises may help you when beginning to rehabilitate your injury. They may resolve your symptoms with or without further involvement from your physician, physical therapist, or athletic trainer. While completing these exercises, remember:  Muscles can gain both the endurance and the strength needed for everyday activities through  controlled exercises. Complete these exercises as instructed by your physician, physical therapist, or athletic trainer. Progress the resistance and repetitions only as guided. You may experience muscle soreness or fatigue, but the pain or discomfort you are trying to eliminate should never worsen during these exercises. If this pain does worsen, stop and make certain you are following the directions exactly. If the pain is still present after adjustments, discontinue the exercise until you can discuss the trouble with your clinician.  STRENGTH - Cervical Flexors, Isometric Face a wall, standing about 6 inches away. Place a small pillow, a ball about 6-8 inches in diameter, or a folded towel between your forehead and the wall. Slightly tuck your chin and gently push your forehead into the soft object. Push only with mild to moderate intensity, building up tension gradually. Keep your jaw and forehead relaxed. Hold 10 to 20 seconds. Keep your breathing relaxed. Release the tension slowly. Relax your neck muscles completely before you start the next repetition. Repeat 2-3 times. Complete this exercise 1 time per day.  STRENGTH- Cervical Lateral Flexors, Isometric  Stand about 6 inches away from a wall. Place a small pillow, a ball about 6-8 inches in diameter, or a folded towel between the side of your head and the wall. Slightly tuck your chin and gently tilt your head into the soft object. Push only with mild to moderate intensity, building up tension gradually. Keep your jaw and forehead relaxed. Hold 10 to 20 seconds. Keep your breathing relaxed. Release the tension slowly. Relax your neck muscles completely before you start the next repetition. Repeat 2-3 times. Complete this exercise 1 time per day.  STRENGTH - Cervical Extensors, Isometric  Stand about 6 inches away from a wall. Place a small pillow, a ball about 6-8 inches in diameter, or a folded towel between the back of your head and the  wall. Slightly tuck your chin and gently tilt your head back into the soft object. Push only with mild to moderate intensity, building up tension gradually. Keep your jaw and forehead relaxed. Hold 10 to 20 seconds. Keep your breathing relaxed. Release the tension slowly. Relax your neck muscles completely before you start the next repetition. Repeat 2-3 times. Complete this exercise 1 time per day.  POSTURE AND BODY MECHANICS CONSIDERATIONS Keeping correct posture when sitting, standing or completing your activities will reduce the stress put on different body tissues, allowing injured tissues a chance to heal and limiting painful experiences. The following are general guidelines for improved posture. Your physician or physical therapist will provide you with any instructions specific to your needs. While reading these guidelines, remember: The exercises prescribed by your provider will help you have the flexibility and strength to maintain correct postures. The correct posture provides the optimal environment for your joints to work. All of your joints have less wear and tear when properly supported by a spine with good posture. This means you will experience a healthier, less painful body. Correct posture must be practiced with all of your activities, especially prolonged sitting and standing. Correct posture is as important when doing repetitive low-stress activities (typing) as it is when doing a single heavy-load activity (lifting).  PROLONGED STANDING WHILE SLIGHTLY LEANING FORWARD When completing a task that requires you to lean forward while standing in one place for a long time, place either foot up on a stationary 2- to 4-inch high object to help maintain the best posture. When both feet are on the ground, the low back tends to lose its slight inward curve. If this curve flattens (or becomes too large), then the back and your other joints will experience too much stress, fatigue more quickly,  and can cause pain.   RESTING POSITIONS Consider which positions are most painful for you when choosing a resting position. If you have pain with flexion-based activities (sitting, bending, stooping, squatting), choose a position that allows you to rest in a less flexed posture. You would want to avoid curling into a fetal position on your side. If your pain worsens with extension-based activities (prolonged standing, working overhead), avoid resting in an extended position such as sleeping on your stomach. Most people will find more comfort when they rest with their spine in a more neutral position, neither too rounded nor too arched. Lying on a non-sagging bed on your side with a pillow between your knees, or on your back with a pillow under your knees will often provide some relief. Keep in mind, being in any one position for a prolonged period of time, no matter how correct your posture, can still lead to stiffness.  WALKING Walk with an upright posture. Your ears, shoulders, and hips should all line up. OFFICE WORK When working at a desk, create an environment that supports good, upright posture. Without extra support, muscles fatigue and lead to excessive strain on joints and other tissues.  CHAIR: A chair should be able to slide under your desk when your back makes contact with the back of the chair. This allows you to work closely. The chair's height should allow your eyes to be level with the upper part of your monitor and your hands to be slightly lower than your elbows. Body position: Your feet should make contact with the floor. If this is not possible, use a foot rest. Keep your ears over your shoulders. This will reduce stress on your neck and low back.

## 2017-02-13 ENCOUNTER — Other Ambulatory Visit: Payer: Self-pay | Admitting: Cardiovascular Disease

## 2017-03-05 ENCOUNTER — Telehealth (HOSPITAL_COMMUNITY): Payer: Self-pay | Admitting: *Deleted

## 2017-03-05 NOTE — Telephone Encounter (Signed)
Pt cld this morning c/o of frequent cough and HA as well as some upset stomach from the diltiazem.  Pt stated that none of these sx started until the dilt and that she discontinued this Friday and the cough has improved.   Medication was to be taken prn for fast HR, but pt has been using it on dialysis days as preventative.  Pt was advised per Roderic Palau, NP to discontinue dilt, add 1/2 tablet of metoprolol (12.5) mg in the morning before dialysis and continue 50 mg in the evening.  Pt understood and will call back Wed. with progress.  Pt reports high BP as well and was told that the metoprolol will help with this. Pt expressed understanding

## 2017-03-06 ENCOUNTER — Encounter: Payer: Self-pay | Admitting: Cardiology

## 2017-03-06 ENCOUNTER — Telehealth: Payer: Self-pay | Admitting: Cardiovascular Disease

## 2017-03-06 ENCOUNTER — Ambulatory Visit (INDEPENDENT_AMBULATORY_CARE_PROVIDER_SITE_OTHER): Payer: Medicare Other | Admitting: Cardiology

## 2017-03-06 VITALS — BP 144/78 | HR 90 | Ht 61.0 in | Wt 195.2 lb

## 2017-03-06 DIAGNOSIS — R05 Cough: Secondary | ICD-10-CM

## 2017-03-06 DIAGNOSIS — I480 Paroxysmal atrial fibrillation: Secondary | ICD-10-CM

## 2017-03-06 DIAGNOSIS — N186 End stage renal disease: Secondary | ICD-10-CM | POA: Diagnosis not present

## 2017-03-06 DIAGNOSIS — Z87898 Personal history of other specified conditions: Secondary | ICD-10-CM | POA: Diagnosis not present

## 2017-03-06 DIAGNOSIS — Z992 Dependence on renal dialysis: Secondary | ICD-10-CM | POA: Diagnosis not present

## 2017-03-06 DIAGNOSIS — R059 Cough, unspecified: Secondary | ICD-10-CM | POA: Insufficient documentation

## 2017-03-06 NOTE — Telephone Encounter (Signed)
Returned call to patient.She stated she has had a non productive cough for over 1 month.Stated she saw PCP and she continues to have cough.She also has chest pain when she coughs.No edema.No sob.Weight stable.Stated she would like to be seen.Appointment scheduled with Kerin Ransom PA this morning at 11:30 am.Advised to bring all medications to appointment.

## 2017-03-06 NOTE — Progress Notes (Signed)
03/06/2017 Karen [name]   02-Apr-1955  [id number]  Primary Physician Lucianne Lei, MD Primary Cardiologist: Dr Gwenlyn Found  HPI:  62 y/o AA female with a history of renal disease. 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 She has a history of chest pain. Dobutamine stress echo at [facility] Feb 2017 was negative and a Lexiscan May 2017 was negative. Coronary CTA done Dec 2017 showed minor CAD.    The patient was seen in the  ED 11/24/15 with rapid AF and chest pain while on dialysis. She converted to NSR in the ED after Diltiazem. She was seen in the AF clinic in Dec 2018 for recurrent PAF on dialysis. She was prescribed Diltiazem PRN. She has had problems with low B/P on dialysis. She saw Dr Rayann Heman Jan 2019 and was given the option of attempt at RFA but she opted for medical Rx.   She is in the office today with her daughter. Her main complaint is dry cough. She seems depressed and a little upset. She denies increased dyspnea. She denies palpitations and is in NSR on exam. She stopped her Diltiazem and Zocor thinking her cough was an allergic reaction.     Current Outpatient Medications  Medication Sig Dispense Refill  . acetaminophen (TYLENOL) 500 MG tablet Take 1,000 mg by mouth daily as needed for mild pain, moderate pain or headache.     Marland Kitchen amoxicillin (AMOXIL) 500 MG capsule Take 2,000 mg by mouth See admin instructions. Take 4 capsules (2000 mg) by mouth one hour prior to dental procedures    . aspirin EC 81 MG tablet Take 81 mg by mouth daily.    . Darbepoetin Alfa-Albumin (ARANESP IJ) Inject as directed every 30 (thirty) days.    Marland Kitchen docusate sodium (COLACE) 100 MG capsule Take 100 mg by mouth 2 (two) times daily as needed for mild constipation.    . lidocaine-prilocaine (EMLA) cream Apply 1 application topically See admin instructions. Apply topically Monday, Wednesday and Friday before dialysis  6  . metoprolol tartrate (LOPRESSOR) 25 MG tablet Take 50 mg by mouth at  bedtime.     . multivitamin (RENA-VIT) TABS tablet Take 1 tablet by mouth daily.    . pantoprazole (PROTONIX) 40 MG tablet TAKE 1 TABLET (40 MG TOTAL) BY MOUTH DAILY. 30 tablet 4  . polyethylene glycol (MIRALAX / GLYCOLAX) packet Take 17 g by mouth daily as needed for mild constipation. Mix in 8 oz liquid and drink    . sevelamer carbonate (RENVELA) 800 MG tablet Take 800-2,400 mg by mouth See admin instructions. 2,400mg  three times daily and 800mg  twice daily with snacks    . diltiazem (CARDIZEM) 30 MG tablet Take 1/2 tablet every 4 hours AS NEEDED for afib heart rate over 100 (Patient not taking: Reported on 03/06/2017) 45 tablet 1  . simvastatin (ZOCOR) 20 MG tablet Take 1 tablet (20 mg total) by mouth at bedtime. (Patient not taking: Reported on 03/06/2017) 30 tablet 11   No current facility-administered medications for this visit.     No Known Allergies  Past Medical History:  Diagnosis Date  . Anemia   . Arthritis    knees  . Chest pain Jan 2016   low risk Myoview   . Complication of anesthesia 2005   difficulty remembering for a while  . Constipation   . ESRD on dialysis [facility]) April 2016   MWF  . GERD (gastroesophageal reflux disease)   . Heart murmur  Nov 2015   Aortic scleosis- no stenosis  . Hemodialysis patient ([place])   . Hypertension   . Shortness of breath dyspnea    with exertion    Social History   Socioeconomic History  . Marital status: Married    Spouse name: Not on file  . Number of children: 2  . Years of education: Not on file  . Highest education level: Not on file  Social Needs  . Financial resource strain: Not on file  . Food insecurity - worry: Not on file  . Food insecurity - inability: Not on file  . Transportation needs - medical: Not on file  . Transportation needs - non-medical: Not on file  Occupational History  . Occupation: SHERIFF'S OFFICE    Employer: [employer]  Tobacco Use  . Smoking status: Never Smoker  . Smokeless tobacco:  Never Used  Substance and Sexual Activity  . Alcohol use: No  . Drug use: No  . Sexual activity: Not on file    Comment: Hysterectomy  Other Topics Concern  . Not on file  Social History Narrative  . Not on file     Family History  Problem Relation Age of Onset  . Kidney disease Mother   . Heart attack Father   . Kidney disease Father   . Diabetes Sister   . Hyperlipidemia Sister   . Hypertension Sister   . Kidney disease Sister        x2     Review of Systems: General: negative for chills, fever, night sweats or weight changes.  Cardiovascular: negative for chest pain, dyspnea on exertion, edema, orthopnea, palpitations, paroxysmal nocturnal dyspnea or shortness of breath Dermatological: negative for rash Respiratory: negative for cough or wheezing Urologic: negative for hematuria Abdominal: negative for nausea, vomiting, diarrhea, bright red blood per rectum, melena, or hematemesis Neurologic: negative for visual changes, syncope, or dizziness All other systems reviewed and are otherwise negative except as noted above.    Blood pressure (!) 144/78, pulse 90, height 5\' 1"  (1.549 m), weight 195 lb 3.2 oz (88.5 kg), SpO2 99 %.  General appearance: alert, cooperative, no distress and moderately obese Neck: no carotid bruit and no JVD Lungs: clear to auscultation bilaterally Heart: regular rate and rhythm and to and fro murmu heard from AVF- Lt upper chest Extremities: no edema Skin: Skin color, texture, turgor normal. No rashes or lesions or rash on her legs Neurologic: Grossly normal   Echo 02/08/17- Study Conclusions  - Left ventricle: The cavity size was normal. Wall thickness was   normal. Systolic function was normal. The estimated ejection   fraction was in the range of 60% to 65%. Wall motion was normal;   there were no regional wall motion abnormalities. Left   ventricular diastolic function parameters were normal. - Left atrium: The atrium was mildly  dilated. - Pulmonary arteries: Systolic pressure was mildly increased. PA   peak pressure: 37 mm Hg (S).    ASSESSMENT AND PLAN:   Dry cough- I explained to her that I did not know what is causing this. She does not appear volume overloaded. I doubt Diltiazem or Zocor caused this and I suggested she resume her Zocor. She is on a PPI.   History of chest pain She has had a negative Dobutamine stress, a negative Myoview, and a low risk coronary CTA-showing minor CAD Dec 2017.  Paroxysmal atrial fibrillation [facility]) She had an episode in May 2017 on HD and again documented atrial fibrillation  with RVR with chest pain in ED 11/24/15. She converted to NSR after Diltiazem.she has not had recurrent PAF. She is a CHADs VASc =1 and is on ASA. NSR by EKG 02/08/17 and on exam today. She is taking her Lopressor 25 mg BID.  End stage renal disease on dialysis [facility]) HD MWF- Dr Marval Regal follows   PLAN  Discussed with Dr Stanford Breed in the office. We reviewed her recent echo. I explained that we did not know the exact cause of her cough, therefor could not treat it. She'll follow up with Dr Marval Regal.    Kerin Ransom PA-C 03/06/2017 11:56 AM

## 2017-03-06 NOTE — Telephone Encounter (Signed)
New Message   Pt c/o of Chest Pain: STAT if CP now or developed within 24 hours  1. Are you having CP right now? Only when she cough  2. Are you experiencing any other symptoms (ex. SOB, nausea, vomiting, sweating)? Nausea and some night sweats  3. How long have you been experiencing CP? Every since the coughing started  4. Is your CP continuous or coming and going? coming and going with her chronic cough  5. Have you taken Nitroglycerin? no  Pt has a chronic cough and says its causing soreness in her chest and throat?

## 2017-03-06 NOTE — Patient Instructions (Addendum)
Karen [name], [name] wants you to follow-up with Dr. Gwenlyn Found in in 6 months. You will receive a reminder letter in the mail two months in advance. If you don't receive a letter, please call our office to schedule the follow-up appointment.

## 2017-03-20 ENCOUNTER — Other Ambulatory Visit: Payer: Self-pay | Admitting: Family Medicine

## 2017-03-20 DIAGNOSIS — R109 Unspecified abdominal pain: Secondary | ICD-10-CM

## 2017-03-22 ENCOUNTER — Ambulatory Visit
Admission: RE | Admit: 2017-03-22 | Discharge: 2017-03-22 | Disposition: A | Discharge status: Home / Self Care | Payer: Medicare Other | Source: Ambulatory Visit | Attending: Family Medicine | Admitting: Family Medicine

## 2017-03-22 DIAGNOSIS — R109 Unspecified abdominal pain: Secondary | ICD-10-CM

## 2017-04-03 ENCOUNTER — Ambulatory Visit: Payer: Medicare Other | Admitting: Cardiovascular Disease

## 2017-05-10 ENCOUNTER — Ambulatory Visit (HOSPITAL_COMMUNITY): Payer: Medicare Other | Admitting: Nurse Practitioner

## 2017-05-15 ENCOUNTER — Other Ambulatory Visit: Payer: Self-pay | Admitting: Internal Medicine

## 2017-05-15 NOTE — Telephone Encounter (Signed)
Rx sent to pharmacy   

## 2017-05-17 ENCOUNTER — Encounter (HOSPITAL_COMMUNITY): Payer: Self-pay | Admitting: Nurse Practitioner

## 2017-05-17 ENCOUNTER — Ambulatory Visit (HOSPITAL_COMMUNITY)
Admission: RE | Admit: 2017-05-17 | Discharge: 2017-05-17 | Disposition: A | Discharge status: Home / Self Care | Payer: Medicare Other | Source: Ambulatory Visit | Attending: Nurse Practitioner | Admitting: Nurse Practitioner

## 2017-05-17 VITALS — BP 98/56 | HR 69 | Ht 61.0 in | Wt 188.0 lb

## 2017-05-17 DIAGNOSIS — K219 Gastro-esophageal reflux disease without esophagitis: Secondary | ICD-10-CM | POA: Insufficient documentation

## 2017-05-17 DIAGNOSIS — N186 End stage renal disease: Secondary | ICD-10-CM | POA: Diagnosis not present

## 2017-05-17 DIAGNOSIS — I959 Hypotension, unspecified: Secondary | ICD-10-CM

## 2017-05-17 DIAGNOSIS — I10 Essential (primary) hypertension: Secondary | ICD-10-CM | POA: Diagnosis present

## 2017-05-17 DIAGNOSIS — Z833 Family history of diabetes mellitus: Secondary | ICD-10-CM | POA: Insufficient documentation

## 2017-05-17 DIAGNOSIS — I120 Hypertensive chronic kidney disease with stage 5 chronic kidney disease or end stage renal disease: Secondary | ICD-10-CM | POA: Insufficient documentation

## 2017-05-17 DIAGNOSIS — Z90710 Acquired absence of both cervix and uterus: Secondary | ICD-10-CM | POA: Diagnosis not present

## 2017-05-17 DIAGNOSIS — Z79899 Other long term (current) drug therapy: Secondary | ICD-10-CM | POA: Diagnosis not present

## 2017-05-17 DIAGNOSIS — Z9049 Acquired absence of other specified parts of digestive tract: Secondary | ICD-10-CM | POA: Insufficient documentation

## 2017-05-17 DIAGNOSIS — Z992 Dependence on renal dialysis: Secondary | ICD-10-CM | POA: Insufficient documentation

## 2017-05-17 DIAGNOSIS — D649 Anemia, unspecified: Secondary | ICD-10-CM | POA: Insufficient documentation

## 2017-05-17 DIAGNOSIS — M179 Osteoarthritis of knee, unspecified: Secondary | ICD-10-CM | POA: Diagnosis not present

## 2017-05-17 DIAGNOSIS — I480 Paroxysmal atrial fibrillation: Secondary | ICD-10-CM | POA: Diagnosis not present

## 2017-05-17 DIAGNOSIS — Z841 Family history of disorders of kidney and ureter: Secondary | ICD-10-CM | POA: Insufficient documentation

## 2017-05-17 DIAGNOSIS — Z8249 Family history of ischemic heart disease and other diseases of the circulatory system: Secondary | ICD-10-CM | POA: Diagnosis not present

## 2017-05-17 DIAGNOSIS — Z7982 Long term (current) use of aspirin: Secondary | ICD-10-CM | POA: Insufficient documentation

## 2017-05-17 MED ORDER — DILTIAZEM HCL 30 MG PO TABS: ORAL_TABLET | ORAL | 1 refills | Status: DC

## 2017-05-17 NOTE — Progress Notes (Addendum)
Primary Care Physician: Lucianne Lei, MD Referring Physician: [facility] ER f/u Cardiologist: Dr. Mattie Marlin Karen is a 62 y.o. [name] with a h/o ESRD on dialysis, failed transplant in 2016, HTN in the past but not treated for HTN currently,that has been having a  spells of palpitations during dialysis. Thuis is mentioned in her chart back to 2017. She was seen in the ED 11/24/15 and 12 /14/18 for same. She converted in the ER after just a few mins after arrival, ekg appeared to be be flutter. Ekg seen form 11/24/2015 flutter vrs fib. She is on ASA for a chadsvasc score of 1. She is on metoprolol 25 mg at hs only because her BP drops with dialysis if she takes her am dose. She has only had the arrhythmia following with dialysis.  F/u in afib clinic from ER, 12/20, she was given prn 30 mg Cardizem to use during dialysis and she did use once during dialysis but did not seem to help. Her nephrologist did mention that she could try 1/2 tab of 30 mg Cardizem prior to starting dialysis.I did talk to Dr. Rayann Heman and he is willing to consider primary ablation. She does have normal heart function by echo in 2017.   She saw Dr.Allred early this year and was offered ablation but she declined. 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 Now that she is post op several weeks, her BP's are running low. When she saw the kidney surgeon a few days ago, he mentioned for her to come here and get  her meds adjusted. During this time, she has not noted any afib, it has been quiet.  Today, she denies symptoms of palpitations, chest pain, shortness of breath, orthopnea, PND, lower extremity edema, dizziness, presyncope, syncope, or neurologic sequela. The patient is tolerating medications without difficulties and is  otherwise without complaint today.   Past Medical History:  Diagnosis Date  . Anemia   . Arthritis    knees  . Chest pain Jan 2016   low risk Myoview   . Complication of anesthesia 2005   difficulty remembering for a while  . Constipation   . ESRD on dialysis [facility]) April 2016   MWF  . GERD (gastroesophageal reflux disease)   . Heart murmur Nov 2015   Aortic scleosis- no stenosis  . Hemodialysis patient ([place])   . Hypertension   . Shortness of breath dyspnea    with exertion   Past Surgical History:  Procedure Laterality Date  . ABDOMINAL HYSTERECTOMY  2005  . AV FISTULA PLACEMENT Left 12/09/2013   Procedure: INSERTION OF ARTERIOVENOUS (AV) GORE-TEX GRAFT ARM;  Surgeon: Elam Dutch, MD;  Location: [place];  Service: Vascular;  Laterality: Left;  . BUNIONECTOMY Bilateral   . CHOLECYSTECTOMY  2005  . COLONOSCOPY    . IR GENERIC HISTORICAL  01/11/2016   IR US GUIDE VASC ACCESS RIGHT 01/11/2016 Corrie Mckusick, DO [facility]-INTERV RAD  . IR GENERIC HISTORICAL  01/11/2016   IR RADIOLOGY PERIPHERAL GUIDED IV START 01/11/2016 Corrie Mckusick, DO [facility]INTER[facility]  . 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 KNEE ARTHROSCOPY Bilateral   . PERIPHERAL VASCULAR CATHETERIZATION N/A 08/31/2015   Procedure: A/V Shuntogram;  Surgeon: Serafina Mitchell, MD;  Location: [facility]  INVASIVE CV LAB;  Service: Cardiovascular;  Laterality: N/A;  . PERIPHERAL VASCULAR CATHETERIZATION Left 08/31/2015   Procedure: Peripheral Vascular Balloon Angioplasty;  Surgeon: Serafina Mitchell, MD;  Location: [place] CV LAB;  Service: Cardiovascular;  Laterality: Left;  arm fistula  . ROTATOR CUFF REPAIR Right 1997    Current Outpatient Medications  Medication Sig Dispense Refill  . acetaminophen (TYLENOL) 500 MG tablet Take 1,000 mg by mouth daily as needed for mild pain, moderate pain or headache.     Marland Kitchen amoxicillin (AMOXIL) 500 MG capsule Take 2,000 mg by mouth See admin instructions. Take 4 capsules (2000 mg) by mouth one hour  prior to dental procedures    . aspirin EC 81 MG tablet Take 81 mg by mouth daily.    . Darbepoetin Alfa-Albumin (ARANESP IJ) Inject as directed every 30 (thirty) days.    Marland Kitchen diltiazem (CARDIZEM) 30 MG tablet Take 1/2 tablet the morning of dialysis 45 tablet 1  . docusate sodium (COLACE) 100 MG capsule Take 100 mg by mouth 2 (two) times daily as needed for mild constipation.    . lidocaine-prilocaine (EMLA) cream Apply 1 application topically See admin instructions. Apply topically Monday, Wednesday and Friday before dialysis  6  . metoprolol succinate (TOPROL-XL) 100 MG 24 hr tablet Take 50 mg by mouth at bedtime. Take with or immediately following a meal.     . multivitamin (RENA-VIT) TABS tablet Take 1 tablet by mouth daily.    . pantoprazole (PROTONIX) 40 MG tablet TAKE 1 TABLET (40 MG TOTAL) BY MOUTH DAILY. 30 tablet 4  . sevelamer carbonate (RENVELA) 800 MG tablet Take 800-2,400 mg by mouth See admin instructions. 2,400mg  three times daily and 800mg  twice daily with snacks    . simvastatin (ZOCOR) 20 MG tablet Take 1 tablet (20 mg total) by mouth at bedtime. 30 tablet 11  . polyethylene glycol (MIRALAX / GLYCOLAX) packet Take 17 g by mouth daily as needed for mild constipation. Mix in 8 oz liquid and drink     No current facility-administered medications for this encounter.     No Known Allergies  Social History   Socioeconomic History  . Marital status: Married    Spouse name: Not on file  . Number of children: 2  . Years of education: Not on file  . Highest education level: Not on file  Occupational History  . Occupation: [employer] OFFICE    Employer: [employer]  Social Needs  . Financial resource strain: Not on file  . Food insecurity:    Worry: Not on file    Inability: Not on file  . Transportation needs:    Medical: Not on file    Non-medical: Not on file  Tobacco Use  . Smoking status: Never Smoker  . Smokeless tobacco: Never Used  Substance and Sexual  Activity  . Alcohol use: No  . Drug use: No  . Sexual activity: Not on file    Comment: Hysterectomy  Lifestyle  . Physical activity:    Days per week: Not on file    Minutes per session: Not on file  . Stress: Not on file  Relationships  . Social connections:    Talks on phone: Not on file    Gets together: Not on file    Attends religious service: Not on file    Active member of club or organization: Not on file    Attends meetings of clubs or organizations: Not on file    Relationship status:  Not on file  . Intimate partner violence:    Fear of current or ex partner: Not on file    Emotionally abused: Not on file    Physically abused: Not on file    Forced sexual activity: Not on file  Other Topics Concern  . Not on file  Social History Narrative  . Not on file    Family History  Problem Relation Age of Onset  . Kidney disease Mother   . Heart attack Father   . Kidney disease Father   . Diabetes Sister   . Hyperlipidemia Sister   . Hypertension Sister   . Kidney disease Sister        x2    ROS- All systems are reviewed and negative except as per the HPI above  Physical Exam: Vitals:   05/17/17 0831  BP: (!) 98/56  Pulse: 69  Weight: 188 lb (85.3 kg)  Height: 5\' 1"  (1.549 m)   Wt Readings from Last 3 Encounters:  05/17/17 188 lb (85.3 kg)  03/06/17 195 lb 3.2 oz (88.5 kg)  02/08/17 197 lb (89.4 kg)    Labs: Lab Results  Component Value Date   NA 138 01/12/2017   K 3.9 01/12/2017   CL 92 (L) 01/12/2017   CO2 30 01/12/2017   GLUCOSE 84 01/12/2017   BUN 12 01/12/2017   CREATININE 4.27 (H) 01/12/2017   CALCIUM 9.4 01/12/2017   PHOS 4.6 12/17/2014   MG 2.1 11/24/2015   Lab Results  Component Value Date   INR 1.16 12/18/2014   Lab Results  Component Value Date   CHOL 165 06/08/2015   HDL 59 06/08/2015   LDLCALC 97 06/08/2015   TRIG 44 06/08/2015     GEN- The patient is well appearing, alert and oriented x 3 today.   Head- normocephalic,  atraumatic Eyes-  Sclera clear, conjunctiva pink Ears- hearing intact Oropharynx- clear Neck- supple, no JVP Lymph- no cervical lymphadenopathy Lungs- Clear to ausculation bilaterally, normal work of breathing Heart- Regular rate and rhythm, no murmurs, rubs or gallops, PMI not laterally displaced GI- soft, NT, ND, + BS Extremities- no clubbing, cyanosis, or edema MS- no significant deformity or atrophy Skin- no rash or lesion Psych- euthymic mood, full affect Neuro- strength and sensation are intact  EKG- NSR at 69 bpm, pr int 142 ms, qrs int 82 ms, qtc 441 ms Epic records reviewed Ekg's from 11/2015 and 12/2016 reviewed that documented arrythmia Echo- 06/08/15-Study Conclusions  - Left ventricle: The cavity size was normal. Wall thickness was   normal. Systolic function was normal. The estimated ejection   fraction was in the range of 55% to 60%. Wall motion was normal;   there were no regional wall motion abnormalities. Left   ventricular diastolic function parameters were normal. - Pericardium, extracardiac: A trivial pericardial effusion was   identified.   Assessment and Plan: 1. Paroxysmal afib/? flutter associated with dialysis She would only be a candidate for amiodarone as far as AAD's 2/2 ESRD, but am concerned re side effect profile with her relatively young age. Dr. Rayann Heman seen in evaluation and offered ablation, but she declined  She would have to be started on anticoagualtion prior to procedure For now continue with ASA for chadsvasc score of 1([name])  2. Hypotension Pt with recent hypertension with transplanted kidney rejection and removal with increase of meds Now post op with hypotention which is more pt's usual Will go back to 1/2-1 tab Cardizem on days of dialysis Reduce  metoprolol succinate 100 mg at hs to 50 mg hs   F/u in  one week  Karen [name], [facility] [address] [place], [place]  [address] [phone]

## 2017-05-17 NOTE — Patient Instructions (Signed)
Decrease metoprolol to 50mg  once a day at bedtime  Cardizem 1/2 tablet days of dialysis

## 2017-05-24 ENCOUNTER — Encounter (HOSPITAL_COMMUNITY): Payer: Self-pay | Admitting: Nurse Practitioner

## 2017-05-24 ENCOUNTER — Ambulatory Visit (HOSPITAL_COMMUNITY)
Admission: RE | Admit: 2017-05-24 | Discharge: 2017-05-24 | Disposition: A | Discharge status: Home / Self Care | Payer: Medicare Other | Source: Ambulatory Visit | Attending: Nurse Practitioner | Admitting: Nurse Practitioner

## 2017-05-24 VITALS — BP 102/64 | HR 70 | Ht 61.0 in | Wt 188.0 lb

## 2017-05-24 DIAGNOSIS — Z7982 Long term (current) use of aspirin: Secondary | ICD-10-CM | POA: Insufficient documentation

## 2017-05-24 DIAGNOSIS — Z833 Family history of diabetes mellitus: Secondary | ICD-10-CM | POA: Insufficient documentation

## 2017-05-24 DIAGNOSIS — I959 Hypotension, unspecified: Secondary | ICD-10-CM | POA: Diagnosis not present

## 2017-05-24 DIAGNOSIS — D649 Anemia, unspecified: Secondary | ICD-10-CM | POA: Insufficient documentation

## 2017-05-24 DIAGNOSIS — N186 End stage renal disease: Secondary | ICD-10-CM | POA: Diagnosis not present

## 2017-05-24 DIAGNOSIS — Z9049 Acquired absence of other specified parts of digestive tract: Secondary | ICD-10-CM | POA: Diagnosis not present

## 2017-05-24 DIAGNOSIS — Z90710 Acquired absence of both cervix and uterus: Secondary | ICD-10-CM | POA: Insufficient documentation

## 2017-05-24 DIAGNOSIS — Z841 Family history of disorders of kidney and ureter: Secondary | ICD-10-CM | POA: Insufficient documentation

## 2017-05-24 DIAGNOSIS — Z8249 Family history of ischemic heart disease and other diseases of the circulatory system: Secondary | ICD-10-CM | POA: Diagnosis not present

## 2017-05-24 DIAGNOSIS — M170 Bilateral primary osteoarthritis of knee: Secondary | ICD-10-CM | POA: Diagnosis not present

## 2017-05-24 DIAGNOSIS — I480 Paroxysmal atrial fibrillation: Secondary | ICD-10-CM | POA: Diagnosis not present

## 2017-05-24 DIAGNOSIS — Z992 Dependence on renal dialysis: Secondary | ICD-10-CM | POA: Insufficient documentation

## 2017-05-24 DIAGNOSIS — Z79899 Other long term (current) drug therapy: Secondary | ICD-10-CM | POA: Insufficient documentation

## 2017-05-24 DIAGNOSIS — K219 Gastro-esophageal reflux disease without esophagitis: Secondary | ICD-10-CM | POA: Insufficient documentation

## 2017-05-24 DIAGNOSIS — I120 Hypertensive chronic kidney disease with stage 5 chronic kidney disease or end stage renal disease: Secondary | ICD-10-CM | POA: Diagnosis not present

## 2017-05-24 NOTE — Addendum Note (Signed)
Encounter addended by: Sherran Needs, NP on: 05/24/2017 9:01 AM  Actions taken: Sign clinical note

## 2017-05-24 NOTE — Progress Notes (Signed)
Primary Care Physician: Lucianne Lei, MD Referring Physician: [facility] ER f/u Cardiologist: Dr. Mattie Marlin Karen [name] is a 62 y.o. female with a h/o ESRD on dialysis, failed transplant in 2016, HTN in the past but not treated for HTN currently,that has been having a  spells of palpitations during dialysis. Thuis is mentioned in her chart back to 2017. She was seen in the ED 11/24/15 and 12 /14/18 for same. She converted in the ER after just a few mins after arrival, ekg appeared to be be flutter. Ekg seen form 11/24/2015 flutter vrs fib. She is on ASA for a chadsvasc score of 1. She is on metoprolol 25 mg at hs only because her BP drops with dialysis if she takes her am dose. She has only had the arrhythmia following with dialysis.  F/u in afib clinic from ER, 12/20, she was given prn 30 mg Cardizem to use during dialysis and she did use once during dialysis but did not seem to help. Her nephrologist did mention that she could try 1/2 tab of 30 mg Cardizem prior to starting dialysis.I did talk to Dr. Rayann Heman and he is willing to consider primary ablation. She does have normal heart function by echo in 2017.   She saw Dr.Allred early this year and was offered ablation but she declined. 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 Now that she is post op several weeks, her BP's are running low. When she saw the kidney surgeon a few days ago, he mentioned for her to come here and get  her meds adjusted. During this time, she has not noted any afib, it has been quiet.  F/u afib clinic 5/9, on lat visit , metoprolol was adjusted as  well as Cardizem. She reports no afib and BP has been running much better, not so low.  Feels improved. She is currently happy with management.  Today, she denies symptoms of  palpitations, chest pain, shortness of breath, orthopnea, PND, lower extremity edema, dizziness, presyncope, syncope, or neurologic sequela. The patient is tolerating medications without difficulties and is otherwise without complaint today.   Past Medical History:  Diagnosis Date  . Anemia   . Arthritis    knees  . Chest pain Jan 2016   low risk Myoview   . Complication of anesthesia 2005   difficulty remembering for a while  . Constipation   . ESRD on dialysis [facility]) April 2016   MWF  . GERD (gastroesophageal reflux disease)   . Heart murmur Nov 2015   Aortic scleosis- no stenosis  . Hemodialysis patient ([place])   . Hypertension   . Shortness of breath dyspnea    with exertion   Past Surgical History:  Procedure Laterality Date  . ABDOMINAL HYSTERECTOMY  2005  . AV FISTULA PLACEMENT Left 12/09/2013   Procedure: INSERTION OF ARTERIOVENOUS (AV) GORE-TEX GRAFT ARM;  Surgeon: Elam Dutch, MD;  Location: [place];  Service: Vascular;  Laterality: Left;  . BUNIONECTOMY Bilateral   . CHOLECYSTECTOMY  2005  . COLONOSCOPY    . IR GENERIC HISTORICAL  01/11/2016   IR US GUIDE VASC ACCESS RIGHT 01/11/2016 Corrie Mckusick, DO [facility]-INTER[facility]  . IR GENERIC HISTORICAL  01/11/2016   IR RADIOLOGY PERIPHERAL GUIDED IV START 01/11/2016 Corrie Mckusick,  DO [facility]INTER[facility]  . 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 KNEE ARTHROSCOPY Bilateral   . PERIPHERAL VASCULAR CATHETERIZATION N/A 08/31/2015   Procedure: A/V Shuntogram;  Surgeon: Serafina Mitchell, MD;  Location: [place] CV LAB;  Service: Cardiovascular;  Laterality: N/A;  . PERIPHERAL VASCULAR CATHETERIZATION Left 08/31/2015   Procedure: Peripheral Vascular Balloon Angioplasty;  Surgeon: Serafina Mitchell, MD;  Location: [place] CV LAB;  Service: Cardiovascular;  Laterality: Left;  arm fistula  . ROTATOR CUFF REPAIR Right 1997    Current Outpatient Medications  Medication Sig Dispense Refill  . acetaminophen (TYLENOL) 500 MG tablet Take 1,000  mg by mouth daily as needed for mild pain, moderate pain or headache.     Marland Kitchen amoxicillin (AMOXIL) 500 MG capsule Take 2,000 mg by mouth See admin instructions. Take 4 capsules (2000 mg) by mouth one hour prior to dental procedures    . aspirin EC 81 MG tablet Take 81 mg by mouth daily.    . Darbepoetin Alfa-Albumin (ARANESP IJ) Inject as directed every 30 (thirty) days.    Marland Kitchen diltiazem (CARDIZEM) 30 MG tablet Take 1/2 tablet the morning of dialysis 45 tablet 1  . docusate sodium (COLACE) 100 MG capsule Take 100 mg by mouth 2 (two) times daily as needed for mild constipation.    . lidocaine-prilocaine (EMLA) cream Apply 1 application topically See admin instructions. Apply topically Monday, Wednesday and Friday before dialysis  6  . metoprolol succinate (TOPROL-XL) 100 MG 24 hr tablet Take 50 mg by mouth at bedtime. Take with or immediately following a meal.     . multivitamin (RENA-VIT) TABS tablet Take 1 tablet by mouth daily.    . pantoprazole (PROTONIX) 40 MG tablet TAKE 1 TABLET (40 MG TOTAL) BY MOUTH DAILY. 30 tablet 4  . polyethylene glycol (MIRALAX / GLYCOLAX) packet Take 17 g by mouth daily as needed for mild constipation. Mix in 8 oz liquid and drink    . sevelamer carbonate (RENVELA) 800 MG tablet Take 800-2,400 mg by mouth See admin instructions. 2,400mg  three times daily and 800mg  twice daily with snacks    . simvastatin (ZOCOR) 20 MG tablet Take 1 tablet (20 mg total) by mouth at bedtime. 30 tablet 11   No current facility-administered medications for this encounter.     No Known Allergies  Social History   Socioeconomic History  . Marital status: Married    Spouse name: Not on file  . Number of children: 2  . Years of education: Not on file  . Highest education level: Not on file  Occupational History  . Occupation: [employer] OFFICE    Employer: [employer]  Social Needs  . Financial resource strain: Not on file  . Food insecurity:    Worry: Not on file    Inability:  Not on file  . Transportation needs:    Medical: Not on file    Non-medical: Not on file  Tobacco Use  . Smoking status: Never Smoker  . Smokeless tobacco: Never Used  Substance and Sexual Activity  . Alcohol use: No  . Drug use: No  . Sexual activity: Not on file    Comment: Hysterectomy  Lifestyle  . Physical activity:    Days per week: Not on file    Minutes per session: Not on file  . Stress: Not on file  Relationships  . Social connections:    Talks on phone: Not on file    Gets together: Not  on file    Attends religious service: Not on file    Active member of club or organization: Not on file    Attends meetings of clubs or organizations: Not on file    Relationship status: Not on file  . Intimate partner violence:    Fear of current or ex partner: Not on file    Emotionally abused: Not on file    Physically abused: Not on file    Forced sexual activity: Not on file  Other Topics Concern  . Not on file  Social History Narrative  . Not on file    Family History  Problem Relation Age of Onset  . Kidney disease Mother   . Heart attack Father   . Kidney disease Father   . Diabetes Sister   . Hyperlipidemia Sister   . Hypertension Sister   . Kidney disease Sister        x2    ROS- All systems are reviewed and negative except as per the HPI above  Physical Exam: Vitals:   05/24/17 0832  BP: 102/64  Pulse: 70  Weight: 188 lb (85.3 kg)  Height: 5\' 1"  (1.549 m)   Wt Readings from Last 3 Encounters:  05/24/17 188 lb (85.3 kg)  05/17/17 188 lb (85.3 kg)  03/06/17 195 lb 3.2 oz (88.5 kg)    Labs: Lab Results  Component Value Date   NA 138 01/12/2017   K 3.9 01/12/2017   CL 92 (L) 01/12/2017   CO2 30 01/12/2017   GLUCOSE 84 01/12/2017   BUN 12 01/12/2017   CREATININE 4.27 (H) 01/12/2017   CALCIUM 9.4 01/12/2017   PHOS 4.6 12/17/2014   MG 2.1 11/24/2015   Lab Results  Component Value Date   INR 1.16 12/18/2014   Lab Results  Component Value  Date   CHOL 165 06/08/2015   HDL 59 06/08/2015   LDLCALC 97 06/08/2015   TRIG 44 06/08/2015     GEN- The patient is well appearing, alert and oriented x 3 today.   Head- normocephalic, atraumatic Eyes-  Sclera clear, conjunctiva pink Ears- hearing intact Oropharynx- clear Neck- supple, no JVP Lymph- no cervical lymphadenopathy Lungs- Clear to ausculation bilaterally, normal work of breathing Heart- Regular rate and rhythm, no murmurs, rubs or gallops, PMI not laterally displaced GI- soft, NT, ND, + BS Extremities- no clubbing, cyanosis, or edema MS- no significant deformity or atrophy Skin- no rash or lesion Psych- euthymic mood, full affect Neuro- strength and sensation are intact  EKG- NSR at 69 bpm, pr int 142 ms, qrs int 82 ms, qtc 441 ms Epic records reviewed Ekg's from 11/2015 and 12/2016 reviewed that documented arrythmia Echo- 06/08/15-Study Conclusions  - Left ventricle: The cavity size was normal. Wall thickness was   normal. Systolic function was normal. The estimated ejection   fraction was in the range of 55% to 60%. Wall motion was normal;   there were no regional wall motion abnormalities. Left   ventricular diastolic function parameters were normal. - Pericardium, extracardiac: A trivial pericardial effusion was   identified.   Assessment and Plan: 1. Paroxysmal afib/? flutter associated with dialysis No afib to report She would only be a candidate for amiodarone as far as AAD's 2/2 ESRD, but am concerned re side effect profile with her relatively young age. Dr. Rayann Heman seen in evaluation and offered ablation, but she declined  She would have to be started on  anticoagulation  prior to procedure For now continue with  ASA for chadsvasc score of 1(female)  2. Hypotension Pt with recent hypertension with transplanted kidney rejection and removal with increase of meds Now post op with hypotention which is more pt's usual Will go back to 1/2-1 tab Cardizem  on days of dialysis Reduce metoprolol succinate 100 mg at hs to 50 mg hs   F/u in 3 months  Karen [name], [facility] [address] [place], [place] [address] [phone]

## 2017-06-04 NOTE — Addendum Note (Signed)
Encounter addended by: Sherran Needs, NP on: 06/04/2017 4:48 PM  Actions taken: LOS modified

## 2017-06-13 ENCOUNTER — Other Ambulatory Visit: Payer: Self-pay | Admitting: Obstetrics and Gynecology

## 2017-06-13 DIAGNOSIS — N644 Mastodynia: Secondary | ICD-10-CM

## 2017-06-15 ENCOUNTER — Ambulatory Visit
Admission: RE | Admit: 2017-06-15 | Discharge: 2017-06-15 | Disposition: A | Discharge status: Home / Self Care | Payer: Medicare Other | Source: Ambulatory Visit | Attending: Obstetrics and Gynecology | Admitting: Obstetrics and Gynecology

## 2017-06-15 ENCOUNTER — Other Ambulatory Visit: Payer: Self-pay | Admitting: Obstetrics and Gynecology

## 2017-06-15 DIAGNOSIS — R921 Mammographic calcification found on diagnostic imaging of breast: Secondary | ICD-10-CM

## 2017-06-15 DIAGNOSIS — N644 Mastodynia: Secondary | ICD-10-CM

## 2017-06-16 ENCOUNTER — Other Ambulatory Visit (HOSPITAL_COMMUNITY): Payer: Self-pay | Admitting: Nurse Practitioner

## 2017-06-19 ENCOUNTER — Other Ambulatory Visit: Payer: Self-pay | Admitting: Obstetrics and Gynecology

## 2017-06-19 ENCOUNTER — Ambulatory Visit
Admission: RE | Admit: 2017-06-19 | Discharge: 2017-06-19 | Disposition: A | Discharge status: Home / Self Care | Payer: Medicare Other | Source: Ambulatory Visit | Attending: Obstetrics and Gynecology | Admitting: Obstetrics and Gynecology

## 2017-06-19 DIAGNOSIS — R921 Mammographic calcification found on diagnostic imaging of breast: Secondary | ICD-10-CM

## 2017-06-27 ENCOUNTER — Telehealth: Payer: Self-pay | Admitting: Hematology and Oncology

## 2017-06-27 ENCOUNTER — Other Ambulatory Visit: Payer: Self-pay | Admitting: Surgery

## 2017-06-27 ENCOUNTER — Encounter: Payer: Self-pay | Admitting: Hematology and Oncology

## 2017-06-27 DIAGNOSIS — C50911 Malignant neoplasm of unspecified site of right female breast: Secondary | ICD-10-CM

## 2017-06-27 NOTE — Telephone Encounter (Signed)
Referral received from Dr. Ninfa Linden at [facility]. Pt has been scheduled for th ept to see Dr. Lindi Adie on 6/17 at 815am. Pt aware to arrive 30 minutes early. Letter mailed.

## 2017-06-29 ENCOUNTER — Encounter: Payer: Self-pay | Admitting: Radiation Oncology

## 2017-07-02 ENCOUNTER — Inpatient Hospital Stay: Payer: Medicare Other | Attending: Hematology and Oncology | Admitting: Hematology and Oncology

## 2017-07-02 ENCOUNTER — Telehealth: Payer: Self-pay | Admitting: Hematology and Oncology

## 2017-07-02 ENCOUNTER — Encounter: Payer: Self-pay | Admitting: *Deleted

## 2017-07-02 ENCOUNTER — Other Ambulatory Visit: Payer: Self-pay | Admitting: *Deleted

## 2017-07-02 VITALS — BP 120/73 | HR 71 | Temp 97.8°F | Resp 18 | Wt 192.0 lb

## 2017-07-02 DIAGNOSIS — C50411 Malignant neoplasm of upper-outer quadrant of right female breast: Secondary | ICD-10-CM

## 2017-07-02 DIAGNOSIS — Z992 Dependence on renal dialysis: Secondary | ICD-10-CM | POA: Insufficient documentation

## 2017-07-02 DIAGNOSIS — N186 End stage renal disease: Secondary | ICD-10-CM | POA: Diagnosis not present

## 2017-07-02 DIAGNOSIS — Z171 Estrogen receptor negative status [ER-]: Secondary | ICD-10-CM | POA: Insufficient documentation

## 2017-07-02 MED ORDER — LIDOCAINE-PRILOCAINE 2.5-2.5 % EX CREA: TOPICAL_CREAM | CUTANEOUS | 3 refills | Status: DC

## 2017-07-02 MED ORDER — PROCHLORPERAZINE MALEATE 10 MG PO TABS: 10.0000 mg | ORAL_TABLET | Freq: Four times a day (QID) | ORAL | 1 refills | Status: DC | PRN

## 2017-07-02 MED ORDER — ONDANSETRON HCL 8 MG PO TABS: 8.0000 mg | ORAL_TABLET | Freq: Two times a day (BID) | ORAL | 1 refills | Status: DC | PRN

## 2017-07-02 NOTE — Progress Notes (Signed)
[facility] NOTE  Patient Care Team: Lucianne Lei, MD as PCP - General (Family Medicine) Donato Heinz, MD (Nephrology) Lorretta Harp, MD as Consulting Physician (Cardiology) Pyrtle, Lajuan Lines, MD as Consulting Physician (Gastroenterology)  CHIEF COMPLAINTS/PURPOSE OF CONSULTATION:  Newly diagnosed breast cancer  HISTORY OF PRESENTING ILLNESS:  Karen [name] 62 y.o. female is here because of recent diagnosis of right breast cancer.  Patient was referred by Dr. Ninfa Linden.  She came in to her primary care physician complaining of heaviness in the right breast and pain.  Mammogram and ultrasound was performed.  To clinical examination there was right breast skin thickening around the area [place].  The mammogram revealed pleomorphic calcifications but span 2 cm.  Ultrasound was then performed it did not show a clear mass.  It showed non-masslike areas of concern from 3 cm from the nipple to the axilla.  Biopsy of the pleomorphic calcifications by stereotactic approach revealed grade 2-3 IDC with DCIS that was ER PR negative and HER-2 positive with lymphovascular invasion.  She was presented to the multidisciplinary tumor board and she is here today to discuss her treatment plan.  I reviewed her records extensively and collaborated the history with the patient.  SUMMARY OF ONCOLOGIC HISTORY:   Malignant neoplasm of upper-outer quadrant of right breast in female, estrogen receptor negative ([place])   06/19/2017 Initial Diagnosis    Right breast skin thickening and heaviness: Screening mammogram detected 2 cm of microcalcifications in the right breast in the posterior third UOQ.?  Skin involvement, stereotactic biopsy revealed IDC grade 2-3 with high-grade DCIS, lymphovascular invasion present, ER 0%, PR 0%, HER-2 positive ratio 8.69, gene copy #11.3 T1CN0 stage Ia      07/02/2017 Cancer Staging    Staging form: Breast, AJCC 8th Edition - Clinical: Stage IA (cT1c, cN0, cM0, G3,  ER-, PR-, HER2+) - Signed by Nicholas Lose, MD on 07/02/2017      MEDICAL HISTORY:  Past Medical History:  Diagnosis Date  . Anemia   . Arthritis    knees  . Chest pain Jan 2016   low risk Myoview   . Complication of anesthesia 2005   difficulty remembering for a while  . Constipation   . ESRD on dialysis [facility]) April 2016   MWF  . GERD (gastroesophageal reflux disease)   . Heart murmur Nov 2015   Aortic scleosis- no stenosis  . Hemodialysis patient ([place])   . Hypertension   . Shortness of breath dyspnea    with exertion    SURGICAL HISTORY: Past Surgical History:  Procedure Laterality Date  . ABDOMINAL HYSTERECTOMY  2005  . AV FISTULA PLACEMENT Left 12/09/2013   Procedure: INSERTION OF ARTERIOVENOUS (AV) GORE-TEX GRAFT ARM;  Surgeon: Elam Dutch, MD;  Location: [place];  Service: Vascular;  Laterality: Left;  . BREAST BIOPSY    . BUNIONECTOMY Bilateral   . CHOLECYSTECTOMY  2005  . COLONOSCOPY    . IR GENERIC HISTORICAL  01/11/2016   IR US GUIDE VASC ACCESS RIGHT 01/11/2016 Corrie Mckusick, DO [facility]  . IR GENERIC HISTORICAL  01/11/2016   IR RADIOLOGY PERIPHERAL GUIDED IV START 01/11/2016 Corrie Mckusick, DO [facility]  . 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 KNEE ARTHROSCOPY Bilateral   . PERIPHERAL VASCULAR CATHETERIZATION N/A 08/31/2015   Procedure: A/V Shuntogram;  Surgeon: Serafina Mitchell, MD;  Location: [place] CV [facility];  Service: Cardiovascular;  Laterality: N/A;  . PERIPHERAL VASCULAR CATHETERIZATION Left 08/31/2015  Procedure: Peripheral Vascular Balloon Angioplasty;  Surgeon: Serafina Mitchell, MD;  Location: [place] [facility];  Service: Cardiovascular;  Laterality: Left;  arm fistula  . ROTATOR CUFF REPAIR Right 1997    SOCIAL HISTORY: Social History   Socioeconomic History  . Marital status: Married    Spouse name: Not on file  . Number of children: 2  . Years of education: Not on file  . Highest education level: Not on file  Occupational  History  . Occupation: [employer] OFFICE    Employer: [employer]  Social Needs  . Financial resource strain: Not on file  . Food insecurity:    Worry: Not on file    Inability: Not on file  . Transportation needs:    Medical: Not on file    Non-medical: Not on file  Tobacco Use  . Smoking status: Never Smoker  . Smokeless tobacco: Never Used  Substance and Sexual Activity  . Alcohol use: No  . Drug use: No  . Sexual activity: Not on file    Comment: Hysterectomy  Lifestyle  . Physical activity:    Days per week: Not on file    Minutes per session: Not on file  . Stress: Not on file  Relationships  . Social connections:    Talks on phone: Not on file    Gets together: Not on file    Attends religious service: Not on file    Active member of club or organization: Not on file    Attends meetings of clubs or organizations: Not on file    Relationship status: Not on file  . Intimate partner violence:    Fear of current or ex partner: Not on file    Emotionally abused: Not on file    Physically abused: Not on file    Forced sexual activity: Not on file  Other Topics Concern  . Not on file  Social History Narrative  . Not on file    FAMILY HISTORY: Family History  Problem Relation Age of Onset  . Kidney disease Mother   . Heart attack Father   . Kidney disease Father   . Diabetes Sister   . Hyperlipidemia Sister   . Hypertension Sister   . Kidney disease Sister        x2    ALLERGIES:  has No Known Allergies.  MEDICATIONS:  Current Outpatient Medications  Medication Sig Dispense Refill  . acetaminophen (TYLENOL) 500 MG tablet Take 1,000 mg by mouth daily as needed for mild pain, moderate pain or headache.     Marland Kitchen amoxicillin (AMOXIL) 500 MG capsule Take 2,000 mg by mouth See admin instructions. Take 4 capsules (2000 mg) by mouth one hour prior to dental procedures    . aspirin EC 81 MG tablet Take 81 mg by mouth daily.    . Darbepoetin Alfa-Albumin (ARANESP  IJ) Inject as directed every 30 (thirty) days.    Marland Kitchen diltiazem (CARDIZEM) 30 MG tablet Take 1/2 tablet the morning of dialysis 45 tablet 1  . diltiazem (CARDIZEM) 30 MG tablet TAKE 1/2 TABLET EVERY 4 HOURS AS NEEDED FOR AFIB HEART RATE OVER 100 45 tablet 1  . docusate sodium (COLACE) 100 MG capsule Take 100 mg by mouth 2 (two) times daily as needed for mild constipation.    . lidocaine-prilocaine (EMLA) cream Apply 1 application topically See admin instructions. Apply topically Monday, Wednesday and Friday before dialysis  6  . metoprolol succinate (TOPROL-XL) 100 MG 24 hr tablet Take  50 mg by mouth at bedtime. Take with or immediately following a meal.     . multivitamin (RENA-VIT) TABS tablet Take 1 tablet by mouth daily.    . pantoprazole (PROTONIX) 40 MG tablet TAKE 1 TABLET (40 MG TOTAL) BY MOUTH DAILY. 30 tablet 4  . polyethylene glycol (MIRALAX / GLYCOLAX) packet Take 17 g by mouth daily as needed for mild constipation. Mix in 8 oz liquid and drink    . sevelamer carbonate (RENVELA) 800 MG tablet Take 800-2,400 mg by mouth See admin instructions. 2,463m three times daily and 80103mtwice daily with snacks    . simvastatin (ZOCOR) 20 MG tablet Take 1 tablet (20 mg total) by mouth at bedtime. 30 tablet 11   No current facility-administered medications for this visit.     REVIEW OF SYSTEMS:   Constitutional: Denies fevers, chills or abnormal night sweats Eyes: Denies blurriness of vision, double vision or watery eyes Ears, nose, mouth, throat, and face: Denies mucositis or sore throat Respiratory: Denies cough, dyspnea or wheezes Cardiovascular: Denies palpitation, chest discomfort or lower extremity swelling Gastrointestinal:  Denies nausea, heartburn or change in bowel habits Skin: Denies abnormal skin rashes Lymphatics: Denies new lymphadenopathy or easy bruising Neurological:Denies numbness, tingling or new weaknesses Behavioral/Psych: Mood is stable, no new changes  Breast:  Extremely tender right breast extensive bruising and most likely a large hematoma. All other systems were reviewed with the patient and are negative.  PHYSICAL EXAMINATION: ECOG PERFORMANCE STATUS: 1 - Symptomatic but completely ambulatory  Vitals:   07/02/17 0825  BP: 120/73  Pulse: 71  Resp: 18  Temp: 97.8 F (36.6 C)  SpO2: 100%   Filed Weights   07/02/17 0825  Weight: 192 lb (87.1 kg)    GENERAL:alert, no distress and comfortable SKIN: skin color, texture, turgor are normal, no rashes or significant lesions EYES: normal, conjunctiva are pink and non-injected, sclera clear OROPHARYNX:no exudate, no erythema and lips, buccal mucosa, and tongue normal  NECK: supple, thyroid normal size, non-tender, without nodularity LYMPH:  no palpable lymphadenopathy in the cervical, axillary or inguinal LUNGS: clear to auscultation and percussion with normal breathing effort HEART: regular rate & rhythm and no murmurs and no lower extremity edema ABDOMEN:abdomen soft, non-tender and normal bowel sounds Musculoskeletal:no cyanosis of digits and no clubbing  PSYCH: alert & oriented x 3 with fluent speech NEURO: no focal motor/sensory deficits BREAST: Skin thickening around the nipple no palpable lumps or nodules. No palpable axillary or supraclavicular lymphadenopathy (exam performed in the presence of a chaperone)   LABORATORY DATA:  I have reviewed the data as listed Lab Results  Component Value Date   WBC 4.6 01/12/2017   HGB 11.3 (L) 01/12/2017   HCT 35.7 (L) 01/12/2017   MCV 96.0 01/12/2017   PLT 224 01/12/2017   Lab Results  Component Value Date   NA 138 01/12/2017   K 3.9 01/12/2017   CL 92 (L) 01/12/2017   CO2 30 01/12/2017    RADIOGRAPHIC STUDIES: I have personally reviewed the radiological reports and agreed with the findings in the report.  ASSESSMENT AND PLAN:  Malignant neoplasm of upper-outer quadrant of right breast in female, estrogen receptor negative  ([place]6/04/2017: Right breast skin thickening and heaviness: Screening mammogram detected 2 cm of microcalcifications in the right breast in the posterior third UOQ.?  Skin involvement, stereotactic biopsy revealed IDC grade 2-3 with high-grade DCIS, lymphovascular invasion present, ER 0%, PR 0%, HER-2 positive ratio 8.69, gene copy #11.3  Pathology  and radiology counseling: Discussed with the patient, the details of pathology including the type of breast cancer,the clinical staging, the significance of ER, PR and HER-2/neu receptors and the implications for treatment. After reviewing the pathology in detail, we proceeded to discuss the different treatment options between surgery, radiation, chemotherapy, antiestrogen therapies.  Recommendation: 1. Breast MRI being performed tomorrow  2. neoadjuvant chemotherapy with Taxol Herceptin followed by Herceptin every 3 weeks for 1 year 3.  Followed by surgery patient prefers to undergo bilateral mastectomies  Neoadjuvant chemotherapy would most definitely assist in downsizing the tumor.  Patient is on hemodialysis on Monday Wednesday Friday.  We will plan to start neoadjuvant chemotherapy on Tuesday or Thursday in 2 weeks Return to clinic to start chemotherapy  All questions were answered. The patient knows to call the clinic with any problems, questions or concerns.    Harriette Ohara, MD 07/02/17

## 2017-07-02 NOTE — Progress Notes (Signed)
Location of Breast Cancer: malignant neoplasm of UOQ of right breast in female, ER -  Did patient present with symptoms (if so, please note symptoms) or was this found on screening mammography?: Patient presented to her PCP office with complaints of heaviness and pain in the right breast.  Breast became sore and tender.  Screening Mammogram 06/19/2017: Detected 2 cm of microcalcifications in the right breast in the posterior third UOQ.?  Skin involvement, stereotactic biopsy revealed IDC grade 2-3 with high-grade DCIS, lymphovascular invasion present, ER 0%, PR 0%, HER-2 positive ratio 8.69, gene copy #11.3 T1CN0 stage Ia.   Ultrasound: did not show a clear mass.  It showed non-mass-like areas of concern from 3 cm from the nipple to the axilla.     Histology per Pathology Report: Right Breast 06/19/2017  Receptor Status: ER(- 0%), PR (- 0%), Her2-neu (+), Ki-67(30%)   Past/Anticipated interventions by surgeon, if any: Dr. Rush Farmer  Past/Anticipated interventions by medical oncology, if any: Chemotherapy  Dr. Lindi Adie 07/02/2017 1. Breast MRI being performed tomorrow-6/18- cancelled, Kidney Dr. said no due to the contrast. 2. neoadjuvant chemotherapy with Taxol Herceptin followed by Herceptin every 3 weeks for 1 year 3.  Followed by surgery patient prefers to undergo bilateral mastectomies.  Neoadjuvant chemotherapy would most definitely assist in downsizing the tumor.  Patient is on hemodialysis on Monday Wednesday Friday.  We will plan to start neoadjuvant chemotherapy on Tuesday or Thursday in 2 weeks  Return to clinic to start chemotherapy 7/1 Port placement on 6/20.  Lymphedema issues, if any: No  Pain issues, if any: Breast is tender and sore.  Occasional throbbing pain.  BP (!) 87/59 (BP Location: Right Arm, Patient Position: Sitting, Cuff Size: Large)   Pulse 80   Temp 98.1 F (36.7 C) (Oral)   Resp 20   Ht '5\' 1"'$  (1.549 m)   Wt 190 lb (86.2 kg)   SpO2 100%   BMI 35.90  kg/m    Wt Readings from Last 3 Encounters:  07/03/17 190 lb (86.2 kg)  07/02/17 192 lb (87.1 kg)  05/24/17 188 lb (85.3 kg)   SAFETY ISSUES:  Prior radiation? No  Pacemaker/ICD? No  Possible current pregnancy? Abdominal hysterectomy  Is the patient on methotrexate? No  Current Complaints / other details: Patient is on hemodialysis.  Patient prefers double mastectomies.    Cori Razor, RN 07/02/2017,1:11 PM

## 2017-07-02 NOTE — Assessment & Plan Note (Addendum)
06/19/2017: Right breast skin thickening and heaviness: Screening mammogram detected 2 cm of microcalcifications in the right breast in the posterior third UOQ.?  Skin involvement, stereotactic biopsy revealed IDC grade 2-3 with high-grade DCIS, lymphovascular invasion present, ER 0%, PR 0%, HER-2 positive ratio 8.69, gene copy #11.3  Pathology and radiology counseling: Discussed with the patient, the details of pathology including the type of breast cancer,the clinical staging, the significance of ER, PR and HER-2/neu receptors and the implications for treatment. After reviewing the pathology in detail, we proceeded to discuss the different treatment options between surgery, radiation, chemotherapy, antiestrogen therapies.  Recommendation: Breast MRI being performed tomorrow1.  2. neoadjuvant chemotherapy 3.  Followed by surgery with either lumpectomy or mastectomy depending on the MRI findings and treatment response 4.  Followed by adjuvant radiation if she undergoes breast conserving surgery 5.  Followed by adjuvant antiestrogen therapy   I discussed with the patient that she will need systemic chemotherapy if the final tumor is more than 5 mm in size. Neoadjuvant chemotherapy would most definitely assist in downsizing the tumor.  However, mastectomy avoidance may not always be possible.  Return to clinic based upon the results of the MRI.

## 2017-07-02 NOTE — Progress Notes (Signed)
START OFF PATHWAY REGIMEN - Breast   [id number]:Paclitaxel Weekly + Trastuzumab Weekly:   Administer weekly:     Paclitaxel      Trastuzumab      Trastuzumab   **Always confirm dose/schedule in your pharmacy ordering system**  Patient Characteristics: Preoperative or Nonsurgical Candidate (Clinical Staging), Neoadjuvant Therapy followed by Surgery, Invasive Disease, Chemotherapy, HER2 Positive, ER Negative/Unknown Therapeutic Status: Preoperative or Nonsurgical Candidate (Clinical Staging) AJCC M Category: cM0 AJCC Grade: G3 Breast Surgical Plan: Neoadjuvant Therapy followed by Surgery ER Status: Negative (-) AJCC 8 Stage Grouping: IA HER2 Status: Positive (+) AJCC T Category: cT1c AJCC N Category: cN0 PR Status: Negative (-) Intent of Therapy: Curative Intent, Discussed with Patient

## 2017-07-02 NOTE — Telephone Encounter (Signed)
Scheduled appts per 6/17 los - spoke w/ pt and confirmed appts.

## 2017-07-03 ENCOUNTER — Other Ambulatory Visit: Payer: Self-pay

## 2017-07-03 ENCOUNTER — Encounter: Payer: Self-pay | Admitting: Radiation Oncology

## 2017-07-03 ENCOUNTER — Inpatient Hospital Stay
Admission: RE | Admit: 2017-07-03 | Discharge: 2017-07-03 | Disposition: A | Discharge status: Home / Self Care | Payer: Medicare Other | Source: Ambulatory Visit | Attending: Surgery | Admitting: Surgery

## 2017-07-03 ENCOUNTER — Ambulatory Visit
Admission: RE | Admit: 2017-07-03 | Discharge: 2017-07-03 | Disposition: A | Discharge status: Home / Self Care | Payer: Medicare Other | Source: Ambulatory Visit | Attending: Radiation Oncology | Admitting: Radiation Oncology

## 2017-07-03 VITALS — BP 87/59 | HR 80 | Temp 98.1°F | Resp 20 | Ht 61.0 in | Wt 190.0 lb

## 2017-07-03 DIAGNOSIS — Z9049 Acquired absence of other specified parts of digestive tract: Secondary | ICD-10-CM | POA: Diagnosis not present

## 2017-07-03 DIAGNOSIS — Z171 Estrogen receptor negative status [ER-]: Secondary | ICD-10-CM | POA: Diagnosis present

## 2017-07-03 DIAGNOSIS — C50411 Malignant neoplasm of upper-outer quadrant of right female breast: Secondary | ICD-10-CM | POA: Insufficient documentation

## 2017-07-03 DIAGNOSIS — I120 Hypertensive chronic kidney disease with stage 5 chronic kidney disease or end stage renal disease: Secondary | ICD-10-CM | POA: Insufficient documentation

## 2017-07-03 DIAGNOSIS — K219 Gastro-esophageal reflux disease without esophagitis: Secondary | ICD-10-CM | POA: Insufficient documentation

## 2017-07-03 DIAGNOSIS — Z992 Dependence on renal dialysis: Secondary | ICD-10-CM | POA: Diagnosis not present

## 2017-07-03 DIAGNOSIS — Z79899 Other long term (current) drug therapy: Secondary | ICD-10-CM | POA: Insufficient documentation

## 2017-07-03 DIAGNOSIS — N186 End stage renal disease: Secondary | ICD-10-CM | POA: Diagnosis not present

## 2017-07-03 DIAGNOSIS — Z7982 Long term (current) use of aspirin: Secondary | ICD-10-CM | POA: Diagnosis not present

## 2017-07-03 DIAGNOSIS — Z8249 Family history of ischemic heart disease and other diseases of the circulatory system: Secondary | ICD-10-CM | POA: Diagnosis not present

## 2017-07-03 NOTE — Progress Notes (Signed)
Radiation Oncology         [phone] ________________________________  Name: Karen [name]        MRN: [id number]  Date of Service: 07/03/2017 DOB: October 17, 1955  HU:[id number], Myra Rude, MD  Coralie Keens, MD     REFERRING PHYSICIAN: Coralie Keens, MD   DIAGNOSIS: The encounter diagnosis was Malignant neoplasm of upper-outer quadrant of right breast in female, estrogen receptor negative ([place]).   HISTORY OF PRESENT ILLNESS: Karen [name] is a 62 y.o. female seen for a new diagnosis of right breast cancer. The patient has a history of end stage renal disease on HD M/W/F. She had prior failed transplant and has been on HD for about a year. She was getting ready to consider another transplant, when she was noted to have thickening of the areolar area on exam. She proceeded to have mammogram taht revealed thickening upper outer quadrant and no visible mass, there were calcifications of the upper outer quadrant as well that measured 2 cm. She underwent an ultrasound that revealed an area of hypoechogenicity from 9-10:00. Her axilla was negative and there was skin thickening. She underwent biopsy on 06/19/17 of the upper outer quadrant which revealed a grade 2 invasive ductal carcinoma with DCIS, ER/PR negative, HER2 amplified with a ratio of 8.69, and a Ki 67 of 30%. She is contemplating bilateral mastectomies but comes today to discuss the role of adjuvant radiotherapy.   PREVIOUS RADIATION THERAPY: No   PAST MEDICAL HISTORY:  Past Medical History:  Diagnosis Date  . Anemia   . Arthritis    knees  . Chest pain Jan 2016   low risk Myoview   . Complication of anesthesia 2005   difficulty remembering for a while  . Constipation   . ESRD on dialysis [facility]) April 2016   MWF  . GERD (gastroesophageal reflux disease)   . Heart murmur Nov 2015   Aortic scleosis- no stenosis  . Hemodialysis patient ([place])   . Hypertension   . Shortness of breath dyspnea    with exertion       PAST  SURGICAL HISTORY: Past Surgical History:  Procedure Laterality Date  . ABDOMINAL HYSTERECTOMY  2005  . AV FISTULA PLACEMENT Left 12/09/2013   Procedure: INSERTION OF ARTERIOVENOUS (AV) GORE-TEX GRAFT ARM;  Surgeon: Elam Dutch, MD;  Location: [place];  Service: Vascular;  Laterality: Left;  . BREAST BIOPSY    . BUNIONECTOMY Bilateral   . CHOLECYSTECTOMY  2005  . COLONOSCOPY    . IR GENERIC HISTORICAL  01/11/2016   IR US GUIDE VASC ACCESS RIGHT 01/11/2016 Corrie Mckusick, DO [facility]  . IR GENERIC HISTORICAL  01/11/2016   IR RADIOLOGY PERIPHERAL GUIDED IV START 01/11/2016 Corrie Mckusick, DO [facility]  . 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 KNEE ARTHROSCOPY Bilateral   . PERIPHERAL VASCULAR CATHETERIZATION N/A 08/31/2015   Procedure: A/V Shuntogram;  Surgeon: Serafina Mitchell, MD;  Location: [place] [facility];  Service: Cardiovascular;  Laterality: N/A;  . PERIPHERAL VASCULAR CATHETERIZATION Left 08/31/2015   Procedure: Peripheral Vascular Balloon Angioplasty;  Surgeon: Serafina Mitchell, MD;  Location: [place] [facility];  Service: Cardiovascular;  Laterality: Left;  arm fistula  . ROTATOR CUFF REPAIR Right 1997     FAMILY HISTORY:  Family History  Problem Relation Age of Onset  . Kidney disease Mother   . Heart attack Father   . Kidney disease Father   . Diabetes Sister   . Hyperlipidemia Sister   .  Hypertension Sister   . Kidney disease Sister        x2     SOCIAL HISTORY:  reports that she has never smoked. She has never used smokeless tobacco. She reports that she does not drink alcohol or use drugs.   ALLERGIES: Patient has no known allergies.   MEDICATIONS:  Current Outpatient Medications  Medication Sig Dispense Refill  . acetaminophen (TYLENOL) 500 MG tablet Take 1,000 mg by mouth daily as needed for mild pain, moderate pain or headache.     Marland Kitchen aspirin EC 81 MG tablet Take 81 mg by mouth daily.    . cephALEXin (KEFLEX) 500 MG capsule Take 1 capsule by  mouth See admin instructions. Take 1 capsule daily ONLY after dialysis on Mon Wed Fri    . diltiazem (CARDIZEM) 30 MG tablet Take 1/2 tablet the morning of dialysis (Patient taking differently: Take 15 mg by mouth See admin instructions. Take 1/2 tablet the morning of dialysis) 45 tablet 1  . lidocaine-prilocaine (EMLA) cream Apply 1 application topically See admin instructions. Apply topically Monday, Wednesday and Friday before dialysis  6  . metoprolol succinate (TOPROL-XL) 100 MG 24 hr tablet Take 50 mg by mouth at bedtime. Take with or immediately following a meal.     . multivitamin (RENA-VIT) TABS tablet Take 1 tablet by mouth daily.    . ondansetron (ZOFRAN) 8 MG tablet Take 1 tablet (8 mg total) by mouth 2 (two) times daily as needed (Nausea or vomiting). 30 tablet 1  . pantoprazole (PROTONIX) 40 MG tablet TAKE 1 TABLET (40 MG TOTAL) BY MOUTH DAILY. 30 tablet 4  . prochlorperazine (COMPAZINE) 10 MG tablet Take 1 tablet (10 mg total) by mouth every 6 (six) hours as needed (Nausea or vomiting). 30 tablet 1  . sevelamer carbonate (RENVELA) 800 MG tablet Take 800-2,400 mg by mouth See admin instructions. 2,463m three times daily and 8066mtwice daily with snacks    . simvastatin (ZOCOR) 20 MG tablet Take 1 tablet (20 mg total) by mouth at bedtime. 30 tablet 11  . amoxicillin (AMOXIL) 500 MG capsule Take 2,000 mg by mouth See admin instructions. Take 4 capsules (2000 mg) by mouth one hour prior to dental procedures    . Darbepoetin Alfa-Albumin (ARANESP IJ) Inject as directed every 30 (thirty) days.     No current facility-administered medications for this encounter.      REVIEW OF SYSTEMS: On review of systems, the patient reports that she is doing well overall. She denies any chest pain, shortness of breath, cough, fevers, chills, night sweats, unintended weight changes. She denies any bowel or bladder disturbances, and denies abdominal pain, nausea or vomiting. She denies any new  musculoskeletal or joint aches or pains. A complete review of systems is obtained and is otherwise negative.     PHYSICAL EXAM:  Wt Readings from Last 3 Encounters:  07/03/17 190 lb (86.2 kg)  07/02/17 192 lb (87.1 kg)  05/24/17 188 lb (85.3 kg)   Temp Readings from Last 3 Encounters:  07/03/17 98.1 F (36.7 C) (Oral)  07/02/17 97.8 F (36.6 C) (Oral)  02/10/17 98 F (36.7 C) (Oral)   BP Readings from Last 3 Encounters:  07/03/17 (!) 87/59  07/02/17 120/73  05/24/17 102/64   Pulse Readings from Last 3 Encounters:  07/03/17 80  07/02/17 71  05/24/17 70     In general this is a well appearing African American female in no acute distress. She is alert and oriented x4  and appropriate throughout the examination. HEENT reveals that the patient is normocephalic, atraumatic. EOMs are intact. Skin is intact without any evidence of gross lesions.  Cardiopulmonary assessment is negative for acute distress and she exhibits normal effort. Bilateral breast exam is deferred.   ECOG = 1  0 - Asymptomatic (Fully active, able to carry on all predisease activities without restriction)  1 - Symptomatic but completely ambulatory (Restricted in physically strenuous activity but ambulatory and able to carry out work of a light or sedentary nature. For example, light housework, office work)  2 - Symptomatic, <50% in bed during the day (Ambulatory and capable of all self care but unable to carry out any work activities. Up and about more than 50% of waking hours)  3 - Symptomatic, >50% in bed, but not bedbound (Capable of only limited self-care, confined to bed or chair 50% or more of waking hours)  4 - Bedbound (Completely disabled. Cannot carry on any self-care. Totally confined to bed or chair)  5 - Death   Eustace Pen MM, Creech RH, Tormey DC, et al. [phone]). "Toxicity and response criteria of the [facility]". [place] Oncol. 5 (6): 649-55    LABORATORY DATA:  Lab  Results  Component Value Date   WBC 4.6 01/12/2017   HGB 11.3 (L) 01/12/2017   HCT 35.7 (L) 01/12/2017   MCV 96.0 01/12/2017   PLT 224 01/12/2017   Lab Results  Component Value Date   NA 138 01/12/2017   K 3.9 01/12/2017   CL 92 (L) 01/12/2017   CO2 30 01/12/2017   Lab Results  Component Value Date   ALT 22 06/07/2015   AST 19 06/07/2015   ALKPHOS 97 06/07/2015   BILITOT 0.3 06/07/2015      RADIOGRAPHY: US Breast Ltd Uni Right Inc Axilla  Result Date: 06/15/2017 CLINICAL DATA:  62 year old patient presents for evaluation of the right breast. Her primary complaint is recent on set of heaviness of the right breast, with some pain. She denies skin erythema, skin darkening, or evidence of skin thickening. She does not palpate a lump. She is on dialysis, Monday, Wednesday, Friday. Her last mammogram was March 09, 2016. EXAM: DIGITAL DIAGNOSTIC BILATERAL MAMMOGRAM WITH CAD AND TOMO ULTRASOUND RIGHT BREAST COMPARISON:  March 09, 2016 ACR Breast Density Category c: The breast tissue is heterogeneously dense, which may obscure small masses. FINDINGS: Significant interval changes in the appearance of the right breast compared to the mammogram of 2018. There is right breast skin thickening, most prominent in the region of the areola and periareolar skin. Diffusely increased and asymmetric density in the outer right breast, without a discrete mass. This increased density extends from the retroareolar region to the posterior third of the breast parenchyma. On tomographic images, there is suggestion of subtle architectural distortion in the posterior third of the upper outer right breast. There are new pleomorphic calcifications in the posterior third of the upper-outer quadrant of the right breast, within the area of increased density. These calcifications span approximately 2.0 cm in greatest diameter. No suspicious findings are identified in the left breast. Mammographic images were processed  with CAD. On physical exam, the skin of the right breast is symmetric in color to the skin of the right breast. No skin erythema or warmth is appreciated. Skin of the right areola is diffusely thickened and asymmetric compared to the left side. Normal skin color is maintained in the right areola. No mass is palpated in the outer  right breast. Targeted ultrasound is performed, showing patchy non masslike areas hypoechogenicity and shadowing are identified in the outer right breast, particularly in the 9-10 o'clock regions extending from approximately 3 cm from the nipple into the axillary tail region. This hypoechogenicity and shadowing is best seen with harmonics. Ultrasound of the right axilla shows a lymph node within normal limits for size and with a cortical thickness of 3-4 mm. No definite/grossly enlarged right axillary lymph nodes. IMPRESSION: Clinical and imaging findings suspicious for right breast cancer involving the majority of the outer right breast, with an inflammatory component. The patient states that her main symptom, heaviness of the right breast, developed recently. No evidence of malignancy in the left breast. RECOMMENDATION: Stereotactic biopsy of the discrete suspicious microcalcifications in the posterior third of the upper outer right breast is recommended. These calcifications are located within the area of added density the mammogram. If these are positive for malignancy, then skin punch biopsy of the right areola would be suggested to evaluate for skin involvement. Findings have been discussed with the patient today and she is scheduled for a right breast stereotactic biopsy on Tuesday, June 19, 2017. I have discussed the findings and recommendations with the patient. Results were also provided in writing at the conclusion of the visit. If applicable, a reminder letter will be sent to the patient regarding the next appointment. BI-RADS CATEGORY  5: Highly suggestive of malignancy.  Electronically Signed   By: Curlene Dolphin M.D.   On: 06/15/2017 11:19   Mm Diag Breast Tomo Bilateral  Result Date: 06/15/2017 CLINICAL DATA:  62 year old patient presents for evaluation of the right breast. Her primary complaint is recent on set of heaviness of the right breast, with some pain. She denies skin erythema, skin darkening, or evidence of skin thickening. She does not palpate a lump. She is on dialysis, Monday, Wednesday, Friday. Her last mammogram was March 09, 2016. EXAM: DIGITAL DIAGNOSTIC BILATERAL MAMMOGRAM WITH CAD AND TOMO ULTRASOUND RIGHT BREAST COMPARISON:  March 09, 2016 ACR Breast Density Category c: The breast tissue is heterogeneously dense, which may obscure small masses. FINDINGS: Significant interval changes in the appearance of the right breast compared to the mammogram of 2018. There is right breast skin thickening, most prominent in the region of the areola and periareolar skin. Diffusely increased and asymmetric density in the outer right breast, without a discrete mass. This increased density extends from the retroareolar region to the posterior third of the breast parenchyma. On tomographic images, there is suggestion of subtle architectural distortion in the posterior third of the upper outer right breast. There are new pleomorphic calcifications in the posterior third of the upper-outer quadrant of the right breast, within the area of increased density. These calcifications span approximately 2.0 cm in greatest diameter. No suspicious findings are identified in the left breast. Mammographic images were processed with CAD. On physical exam, the skin of the right breast is symmetric in color to the skin of the right breast. No skin erythema or warmth is appreciated. Skin of the right areola is diffusely thickened and asymmetric compared to the left side. Normal skin color is maintained in the right areola. No mass is palpated in the outer right breast. Targeted ultrasound  is performed, showing patchy non masslike areas hypoechogenicity and shadowing are identified in the outer right breast, particularly in the 9-10 o'clock regions extending from approximately 3 cm from the nipple into the axillary tail region. This hypoechogenicity and shadowing is best seen with  harmonics. Ultrasound of the right axilla shows a lymph node within normal limits for size and with a cortical thickness of 3-4 mm. No definite/grossly enlarged right axillary lymph nodes. IMPRESSION: Clinical and imaging findings suspicious for right breast cancer involving the majority of the outer right breast, with an inflammatory component. The patient states that her main symptom, heaviness of the right breast, developed recently. No evidence of malignancy in the left breast. RECOMMENDATION: Stereotactic biopsy of the discrete suspicious microcalcifications in the posterior third of the upper outer right breast is recommended. These calcifications are located within the area of added density the mammogram. If these are positive for malignancy, then skin punch biopsy of the right areola would be suggested to evaluate for skin involvement. Findings have been discussed with the patient today and she is scheduled for a right breast stereotactic biopsy on Tuesday, June 19, 2017. I have discussed the findings and recommendations with the patient. Results were also provided in writing at the conclusion of the visit. If applicable, a reminder letter will be sent to the patient regarding the next appointment. BI-RADS CATEGORY  5: Highly suggestive of malignancy. Electronically Signed   By: Curlene Dolphin M.D.   On: 06/15/2017 11:19   Mm Clip Placement Right  Result Date: 06/19/2017 CLINICAL DATA:  Post stereotactic guided biopsy of suspicious calcifications in the upper-outer posterior right breast. EXAM: DIAGNOSTIC RIGHT MAMMOGRAM POST STEREOTACTIC BIOPSY COMPARISON:  Previous exam(s). FINDINGS: Mammographic images were  obtained following stereotactic guided biopsy of calcifications in the upper-outer posterior right breast. A coil shaped biopsy marking clip is present at the site of biopsied calcifications in the upper-outer posterior right breast. IMPRESSION: Coil shaped biopsy marking clip at site of biopsied calcifications in the upper-outer posterior right breast. Final Assessment: Post Procedure Mammograms for Marker Placement Electronically Signed   By: Everlean Alstrom M.D.   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 This was found to be concordant by Dr. Everlean Alstrom. Pathology results were discussed with the patient by telephone. The patient reported doing well after the biopsy with tenderness at the site. Post biopsy instructions and care were reviewed and questions were answered. The patient was encouraged to call [facility] for any additional concerns. Surgical consultation has been arranged with Dr. Nedra Hai at [facility] on June 26, 2017. Skin punch biopsy of the right areola would be suggested to evaluate for skin involvement to rule out inflammatory breast cancer. Recommendation for a bilateral breast MRI for further evaluation of extent of disease and heterogeneously dense breast tissue. Pathology results reported by Terie Purser, RN on 06/20/2017. Electronically Signed   By: Everlean Alstrom M.D.   On: 06/20/2017 12:45   Result Date: 06/20/2017 CLINICAL DATA:  62 year old female with suspicious calcifications in the upper-outer posterior right breast. EXAM: RIGHT BREAST STEREOTACTIC CORE NEEDLE BIOPSY COMPARISON:  Previous exams. FINDINGS: The patient and I discussed  the procedure of stereotactic-guided biopsy including benefits and alternatives. We discussed the high likelihood of a successful procedure. We discussed the risks of the procedure including infection, bleeding, tissue injury, clip migration, and inadequate sampling. Informed written consent was given. The usual time out protocol was performed immediately prior  to the procedure. Using sterile technique and 1% Lidocaine as local anesthetic, under stereotactic guidance, a 9 gauge vacuum assisted device was used to perform core needle biopsy of the calcifications in the upper-outer posterior right breast using a superior to inferior approach. Specimen radiograph was performed showing the presence of calcifications. Specimens with calcifications are identified for pathology. Lesion quadrant: Upper-outer At the conclusion of the procedure, a coil shaped tissue marker clip was deployed into the biopsy cavity. Follow-up 2-view mammogram was performed and dictated separately. IMPRESSION: Stereotactic-guided biopsy of the calcifications in the upper-outer posterior right breast. No apparent complications. Electronically Signed: By: Everlean Alstrom M.D. On: 06/19/2017 09:52       IMPRESSION/PLAN: 1. At least Stage IA, cT1cN0 grade 2-3 HER2 amplified invasive ductal carcinoma with high grade DCIS of the right breast. Dr. Lisbeth Renshaw discusses the pathology findings and reviews the nature of invasive breast disease. The patient's case has been reviewed in conference and it was recommended that she undergo MRI, however her nephrologist has concerns with this given the contrast. She also could proceed with a biopsy of the skin thickening, though this was felt to be low yield for inflammatory disease. If she proceed with bilateral mastectomies she may not need to have additional imaging or biopsy. We discussed the risks, benefits, short, and long term effects of radiotherapy, and the patient is interested in proceeding. Dr. Lisbeth Renshaw  discusses the delivery and logistics of radiotherapy and discusses that we will follow up with her final pathology results, but based on the information we have today, we do not anticipate a role for radiotherapy if she undergoes bilateral mastectomies. 2. End Stage Renal Failure on Hemodialysis. The patient will follow up with HD on M/W/F with Dr. Marval Regal.   In a visit lasting 45 minutes, greater than 50% of the time was spent face to face discussing her case, and coordinating the patient's care.   The above documentation reflects my direct findings during this shared patient visit. Please see the separate note by Dr. Lisbeth Renshaw on this date for the remainder of the patient's plan of care.    Carola Rhine, PAC

## 2017-07-04 ENCOUNTER — Other Ambulatory Visit: Payer: Self-pay | Admitting: Student

## 2017-07-04 ENCOUNTER — Other Ambulatory Visit: Payer: Self-pay | Admitting: Radiology

## 2017-07-05 ENCOUNTER — Encounter (HOSPITAL_COMMUNITY): Payer: Self-pay

## 2017-07-05 ENCOUNTER — Other Ambulatory Visit: Payer: Medicare Other

## 2017-07-05 ENCOUNTER — Other Ambulatory Visit: Payer: Self-pay | Admitting: Hematology and Oncology

## 2017-07-05 ENCOUNTER — Ambulatory Visit (HOSPITAL_COMMUNITY)
Admission: RE | Admit: 2017-07-05 | Discharge: 2017-07-05 | Disposition: A | Discharge status: Home / Self Care | Payer: Medicare Other | Source: Ambulatory Visit | Attending: Hematology and Oncology | Admitting: Hematology and Oncology

## 2017-07-05 DIAGNOSIS — Z940 Kidney transplant status: Secondary | ICD-10-CM | POA: Diagnosis not present

## 2017-07-05 DIAGNOSIS — C50911 Malignant neoplasm of unspecified site of right female breast: Secondary | ICD-10-CM | POA: Diagnosis not present

## 2017-07-05 DIAGNOSIS — I120 Hypertensive chronic kidney disease with stage 5 chronic kidney disease or end stage renal disease: Secondary | ICD-10-CM | POA: Diagnosis not present

## 2017-07-05 DIAGNOSIS — Z7982 Long term (current) use of aspirin: Secondary | ICD-10-CM | POA: Insufficient documentation

## 2017-07-05 DIAGNOSIS — C50411 Malignant neoplasm of upper-outer quadrant of right female breast: Secondary | ICD-10-CM

## 2017-07-05 DIAGNOSIS — M170 Bilateral primary osteoarthritis of knee: Secondary | ICD-10-CM | POA: Insufficient documentation

## 2017-07-05 DIAGNOSIS — N186 End stage renal disease: Secondary | ICD-10-CM | POA: Insufficient documentation

## 2017-07-05 DIAGNOSIS — Z171 Estrogen receptor negative status [ER-]: Principal | ICD-10-CM

## 2017-07-05 DIAGNOSIS — Z8249 Family history of ischemic heart disease and other diseases of the circulatory system: Secondary | ICD-10-CM | POA: Insufficient documentation

## 2017-07-05 DIAGNOSIS — K219 Gastro-esophageal reflux disease without esophagitis: Secondary | ICD-10-CM | POA: Insufficient documentation

## 2017-07-05 DIAGNOSIS — Z992 Dependence on renal dialysis: Secondary | ICD-10-CM | POA: Diagnosis not present

## 2017-07-05 HISTORY — PX: IR IMAGING GUIDED PORT INSERTION: IMG5740

## 2017-07-05 LAB — BASIC METABOLIC PANEL
ANION GAP: 11 (ref 5–15)
BUN: 24 mg/dL — ABNORMAL HIGH (ref 6–20)
CALCIUM: 9.6 mg/dL (ref 8.9–10.3)
CO2: 33 mmol/L — ABNORMAL HIGH (ref 22–32)
Chloride: 100 mmol/L — ABNORMAL LOW (ref 101–111)
Creatinine, Ser: 5.51 mg/dL — ABNORMAL HIGH (ref 0.44–1.00)
GFR, EST AFRICAN AMERICAN: 9 mL/min — AB (ref >60)
GFR, EST NON AFRICAN AMERICAN: 8 mL/min — AB (ref >60)
Glucose, Bld: 90 mg/dL (ref 65–99)
POTASSIUM: 4 mmol/L (ref 3.5–5.1)
SODIUM: 144 mmol/L (ref 135–145)

## 2017-07-05 LAB — CBC
HEMATOCRIT: 37.1 % (ref 36.0–46.0)
HEMOGLOBIN: 11.5 g/dL — AB (ref 12.0–15.0)
MCH: 31.4 pg (ref 26.0–34.0)
MCHC: 31 g/dL (ref 30.0–36.0)
MCV: 101.4 fL — ABNORMAL HIGH (ref 78.0–100.0)
Platelets: 256 10*3/uL (ref 150–400)
RBC: 3.66 MIL/uL — ABNORMAL LOW (ref 3.87–5.11)
RDW: 15.8 % — ABNORMAL HIGH (ref 11.5–15.5)
WBC: 4.1 10*3/uL (ref 4.0–10.5)

## 2017-07-05 LAB — PROTIME-INR
INR: 1
PROTHROMBIN TIME: 13.1 s (ref 11.4–15.2)

## 2017-07-05 MED ORDER — LIDOCAINE-EPINEPHRINE 2 %-1:100000 IJ SOLN
INTRAMUSCULAR | Status: AC | PRN
  Administered 2017-07-05: 20 mL

## 2017-07-05 MED ORDER — LIDOCAINE-EPINEPHRINE (PF) 1 %-1:200000 IJ SOLN
INTRAMUSCULAR | Status: AC
  Filled 2017-07-05: qty 30

## 2017-07-05 MED ORDER — MIDAZOLAM HCL 2 MG/2ML IJ SOLN
INTRAMUSCULAR | Status: AC
  Filled 2017-07-05: qty 6

## 2017-07-05 MED ORDER — FENTANYL CITRATE (PF) 100 MCG/2ML IJ SOLN
INTRAMUSCULAR | Status: AC | PRN
  Administered 2017-07-05 (×3): 50 ug via INTRAVENOUS

## 2017-07-05 MED ORDER — SODIUM CHLORIDE 0.9 % IV SOLN: INTRAVENOUS | Status: DC

## 2017-07-05 MED ORDER — CEFAZOLIN SODIUM-DEXTROSE 2-4 GM/100ML-% IV SOLN
2.0000 g | INTRAVENOUS | Status: AC
  Administered 2017-07-05: 2 g via INTRAVENOUS

## 2017-07-05 MED ORDER — HEPARIN SOD (PORK) LOCK FLUSH 100 UNIT/ML IV SOLN
INTRAVENOUS | Status: AC
Start: 2017-07-05 — End: 2017-07-05
  Filled 2017-07-05: qty 5

## 2017-07-05 MED ORDER — MIDAZOLAM HCL 2 MG/2ML IJ SOLN
INTRAMUSCULAR | Status: AC | PRN
  Administered 2017-07-05 (×3): 1 mg via INTRAVENOUS

## 2017-07-05 MED ORDER — CEFAZOLIN SODIUM-DEXTROSE 2-4 GM/100ML-% IV SOLN
INTRAVENOUS | Status: AC
  Administered 2017-07-05: 2 g via INTRAVENOUS
  Filled 2017-07-05: qty 100

## 2017-07-05 MED ORDER — FENTANYL CITRATE (PF) 100 MCG/2ML IJ SOLN
INTRAMUSCULAR | Status: AC
  Filled 2017-07-05: qty 4

## 2017-07-05 NOTE — Discharge Instructions (Addendum)
Implanted Port Insertion, Care After °This sheet gives you information about how to care for yourself after your procedure. Your health care provider may also give you more specific instructions. If you have problems or questions, contact your health care provider. °What can I expect after the procedure? °After your procedure, it is common to have: °· Discomfort at the port insertion site. °· Bruising on the skin over the port. This should improve over 3-4 days. ° °Follow these instructions at home: °Port care °· After your port is placed, you will get a manufacturer's information card. The card has information about your port. Keep this card with you at all times. °· Take care of the port as told by your health care provider. Ask your health care provider if you or a family member can get training for taking care of the port at home. A home health care nurse may also take care of the port. °· Make sure to remember what type of port you have. °Incision care °· Follow instructions from your health care provider about how to take care of your port insertion site. Make sure you: °? Wash your hands with soap and water before you change your bandage (dressing). If soap and water are not available, use hand sanitizer. °? Change your dressing as told by your health care provider. °? Leave stitches (sutures), skin glue, or adhesive strips in place. These skin closures may need to stay in place for 2 weeks or longer. If adhesive strip edges start to loosen and curl up, you may trim the loose edges. Do not remove adhesive strips completely unless your health care provider tells you to do that. °· Check your port insertion site every day for signs of infection. Check for: °? More redness, swelling, or pain. °? More fluid or blood. °? Warmth. °? Pus or a bad smell. °General instructions °· Do not take baths, swim, or use a hot tub until your health care provider approves. °· Do not lift anything that is heavier than 10 lb (4.5  kg) for a week, or as told by your health care provider. °· Ask your health care provider when it is okay to: °? Return to work or school. °? Resume usual physical activities or sports. °· Do not drive for 24 hours if you were given a medicine to help you relax (sedative). °· Take over-the-counter and prescription medicines only as told by your health care provider. °· Wear a medical alert bracelet in case of an emergency. This will tell any health care providers that you have a port. °· Keep all follow-up visits as told by your health care provider. This is important. °Contact a health care provider if: °· You cannot flush your port with saline as directed, or you cannot draw blood from the port. °· You have a fever or chills. °· You have more redness, swelling, or pain around your port insertion site. °· You have more fluid or blood coming from your port insertion site. °· Your port insertion site feels warm to the touch. °· You have pus or a bad smell coming from the port insertion site. °Get help right away if: °· You have chest pain or shortness of breath. °· You have bleeding from your port that you cannot control. °Summary °· Take care of the port as told by your health care provider. °· Change your dressing as told by your health care provider. °· Keep all follow-up visits as told by your health care provider. °  This information is not intended to replace advice given to you by your health care provider. Make sure you discuss any questions you have with your health care provider. °Document Released: 10/23/2012 Document Revised: 11/24/2015 Document Reviewed: 11/24/2015 °[employer] Interactive Patient Education © 2017 [employer] °Implanted Port Home Guide °An implanted port is a type of central line that is placed under the skin. Central lines are used to provide IV access when treatment or nutrition needs to be given through a person’s veins. Implanted ports are used for long-term IV access. An implanted port  may be placed because: °· You need IV medicine that would be irritating to the small veins in your hands or arms. °· You need long-term IV medicines, such as antibiotics. °· You need IV nutrition for a long period. °· You need frequent blood draws for lab tests. °· You need dialysis. ° °Implanted ports are usually placed in the chest area, but they can also be placed in the upper arm, the abdomen, or the leg. An implanted port has two main parts: °· Reservoir. The reservoir is round and will appear as a small, raised area under your skin. The reservoir is the part where a needle is inserted to give medicines or draw blood. °· Catheter. The catheter is a thin, flexible tube that extends from the reservoir. The catheter is placed into a large vein. Medicine that is inserted into the reservoir goes into the catheter and then into the vein. ° °How will I care for my incision site? °Do not get the incision site wet. Bathe or shower as directed by your health care provider. °How is my port accessed? °Special steps must be taken to access the port: °· Before the port is accessed, a numbing cream can be placed on the skin. This helps numb the skin over the port site. °· Your health care provider uses a sterile technique to access the port. °? Your health care provider must put on a mask and sterile gloves. °? The skin over your port is cleaned carefully with an antiseptic and allowed to dry. °? The port is gently pinched between sterile gloves, and a needle is inserted into the port. °· Only "non-coring" port needles should be used to access the port. Once the port is accessed, a blood return should be checked. This helps ensure that the port is in the vein and is not clogged. °· If your port needs to remain accessed for a constant infusion, a clear (transparent) bandage will be placed over the needle site. The bandage and needle will need to be changed every week, or as directed by your health care provider. °· Keep the  bandage covering the needle clean and dry. Do not get it wet. Follow your health care provider’s instructions on how to take a shower or bath while the port is accessed. °· If your port does not need to stay accessed, no bandage is needed over the port. ° °What is flushing? °Flushing helps keep the port from getting clogged. Follow your health care provider’s instructions on how and when to flush the port. Ports are usually flushed with saline solution or a medicine called heparin. The need for flushing will depend on how the port is used. °· If the port is used for intermittent medicines or blood draws, the port will need to be flushed: °? After medicines have been given. °? After blood has been drawn. °? As part of routine maintenance. °· If a constant infusion is   running, the port may not need to be flushed. ° °How long will my port stay implanted? °The port can stay in for as long as your health care provider thinks it is needed. When it is time for the port to come out, surgery will be done to remove it. The procedure is similar to the one performed when the port was put in. °When should I seek immediate medical care? °When you have an implanted port, you should seek immediate medical care if: °· You notice a bad smell coming from the incision site. °· You have swelling, redness, or drainage at the incision site. °· You have more swelling or pain at the port site or the surrounding area. °· You have a fever that is not controlled with medicine. ° °This information is not intended to replace advice given to you by your health care provider. Make sure you discuss any questions you have with your health care provider. °Document Released: 01/02/2005 Document Revised: 06/10/2015 Document Reviewed: 09/09/2012 °[employer] Interactive Patient Education © 2017 [employer] °Moderate Conscious Sedation, Adult, Care After °These instructions provide you with information about caring for yourself after your procedure. Your  health care provider may also give you more specific instructions. Your treatment has been planned according to current medical practices, but problems sometimes occur. Call your health care provider if you have any problems or questions after your procedure. °What can I expect after the procedure? °After your procedure, it is common: °· To feel sleepy for several hours. °· To feel clumsy and have poor balance for several hours. °· To have poor judgment for several hours. °· To vomit if you eat too soon. ° °Follow these instructions at home: °For at least 24 hours after the procedure: ° °· Do not: °? Participate in activities where you could fall or become injured. °? Drive. °? Use heavy machinery. °? Drink alcohol. °? Take sleeping pills or medicines that cause drowsiness. °? Make important decisions or sign legal documents. °? Take care of children on your own. °· Rest. °Eating and drinking °· Follow the diet recommended by your health care provider. °· If you vomit: °? Drink water, juice, or soup when you can drink without vomiting. °? Make sure you have little or no nausea before eating solid foods. °General instructions °· Have a responsible adult stay with you until you are awake and alert. °· Take over-the-counter and prescription medicines only as told by your health care provider. °· If you smoke, do not smoke without supervision. °· Keep all follow-up visits as told by your health care provider. This is important. °Contact a health care provider if: °· You keep feeling nauseous or you keep vomiting. °· You feel light-headed. °· You develop a rash. °· You have a fever. °Get help right away if: °· You have trouble breathing. °This information is not intended to replace advice given to you by your health care provider. Make sure you discuss any questions you have with your health care provider. °Document Released: 10/23/2012 Document Revised: 06/07/2015 Document Reviewed: 04/24/2015 °[employer] Interactive  Patient Education © 2018 [employer] ° °

## 2017-07-05 NOTE — Sedation Documentation (Signed)
NRB mask applied- sats up to 95%

## 2017-07-05 NOTE — H&P (Signed)
Chief Complaint: Patient was seen in consultation today for port placement at the request of [place]  Referring Physician(s): Gudena,Vinay  Supervising Physician: Sandi Mariscal  Patient Status: [name] - Out-pt  History of Present Illness: Karen [name] is a 62 y.o. female recently diagnosed with right sided breast cancer. She has seen oncology and is to begin chemotherapy soon. She is referred for port placement. It sounds like she is also to undergo bilateral mastectomy, with the cancer being in the right breast. No definitive plans for radiation for now. PMHx, meds, labs, imaging, allergies reviewed. Feels well, no recent fevers, chills, illness. Has been NPO today as directed. Family at bedside.   Past Medical History:  Diagnosis Date  . Anemia   . Arthritis    knees  . Chest pain Jan 2016   low risk Myoview   . Complication of anesthesia 2005   difficulty remembering for a while  . Constipation   . ESRD on dialysis [facility]) April 2016   MWF  . GERD (gastroesophageal reflux disease)   . Heart murmur Nov 2015   Aortic scleosis- no stenosis  . Hemodialysis patient ([place])   . Hypertension   . Shortness of breath dyspnea    with exertion    Past Surgical History:  Procedure Laterality Date  . ABDOMINAL HYSTERECTOMY  2005  . AV FISTULA PLACEMENT Left 12/09/2013   Procedure: INSERTION OF ARTERIOVENOUS (AV) GORE-TEX GRAFT ARM;  Surgeon: Elam Dutch, MD;  Location: [place];  Service: Vascular;  Laterality: Left;  . BREAST BIOPSY    . BUNIONECTOMY Bilateral   . CHOLECYSTECTOMY  2005  . COLONOSCOPY    . IR GENERIC HISTORICAL  01/11/2016   IR US GUIDE VASC ACCESS RIGHT 01/11/2016 Corrie Mckusick, DO [facility]-INTERV RAD  . IR GENERIC HISTORICAL  01/11/2016   IR RADIOLOGY PERIPHERAL GUIDED IV START 01/11/2016 Corrie Mckusick, DO [facility]-INTER[facility] RAD  . 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 KNEE ARTHROSCOPY Bilateral   . PERIPHERAL VASCULAR CATHETERIZATION N/A 08/31/2015   Procedure: A/V Shuntogram;  Surgeon: Serafina Mitchell, MD;  Location: [place] CV LAB;  Service: Cardiovascular;  Laterality: N/A;  . PERIPHERAL VASCULAR CATHETERIZATION Left 08/31/2015   Procedure: Peripheral Vascular Balloon Angioplasty;  Surgeon: Serafina Mitchell, MD;  Location: [place] CV LAB;  Service: Cardiovascular;  Laterality: Left;  arm fistula  . ROTATOR CUFF REPAIR Right 1997    Allergies: Patient has no known allergies.  Medications: Prior to Admission medications   Medication Sig Start Date End Date Taking? Authorizing Provider  acetaminophen (TYLENOL) 500 MG tablet Take 1,000 mg by mouth daily as needed for mild pain, moderate pain or headache.    Yes [provider]  aspirin EC 81 MG tablet Take 81 mg by mouth daily.   Yes [provider]  cephALEXin (KEFLEX) 500 MG capsule Take 1 capsule by mouth See admin instructions. Take 1 capsule daily ONLY after dialysis on Mon Wed Fri   Yes [provider]  Darbepoetin Alfa-Albumin (ARANESP IJ) Inject as directed every 30 (thirty) days.   Yes [provider]  diltiazem (CARDIZEM) 30 MG tablet Take 1/2 tablet the morning of dialysis Patient taking differently: Take 15 mg by mouth See admin instructions. Take 1/2 tablet the morning of dialysis 05/17/17  Yes Sherran Needs, NP  lidocaine-prilocaine (EMLA) cream Apply 1 application topically See admin instructions. Apply topically Monday, Wednesday and Friday before dialysis 05/26/15  Yes [provider]  metoprolol succinate (  TOPROL-XL) 100 MG 24 hr tablet Take 50 mg by mouth at bedtime. Take with or immediately following a meal.    Yes [provider]  multivitamin (RENA-VIT) TABS tablet Take 1 tablet by mouth daily.   Yes [provider]  ondansetron (ZOFRAN) 8 MG tablet Take 1 tablet (8 mg total) by mouth 2 (two) times daily as needed (Nausea or vomiting). 07/02/17  Yes Nicholas Lose, MD  pantoprazole (PROTONIX) 40 MG  tablet TAKE 1 TABLET (40 MG TOTAL) BY MOUTH DAILY. 02/13/17  Yes Lorretta Harp, MD  sevelamer carbonate (RENVELA) 800 MG tablet Take 800-2,400 mg by mouth See admin instructions. 2,400mg  three times daily and 800mg  twice daily with snacks   Yes [provider]  simvastatin (ZOCOR) 20 MG tablet Take 1 tablet (20 mg total) by mouth at bedtime. 11/16/16 07/03/17 Yes Kilroy, Luke K, PA-C  amoxicillin (AMOXIL) 500 MG capsule Take 2,000 mg by mouth See admin instructions. Take 4 capsules (2000 mg) by mouth one hour prior to dental procedures    [provider]  prochlorperazine (COMPAZINE) 10 MG tablet Take 1 tablet (10 mg total) by mouth every 6 (six) hours as needed (Nausea or vomiting). 07/02/17   Nicholas Lose, MD     Family History  Problem Relation Age of Onset  . Kidney disease Mother   . Heart attack Father   . Kidney disease Father   . Diabetes Sister   . Hyperlipidemia Sister   . Hypertension Sister   . Kidney disease Sister        x2    Social History   Socioeconomic History  . Marital status: Married    Spouse name: Not on file  . Number of children: 2  . Years of education: Not on file  . Highest education level: Not on file  Occupational History  . Occupation: [employer] OFFICE    Employer: [employer]  Social Needs  . Financial resource strain: Not on file  . Food insecurity:    Worry: Not on file    Inability: Not on file  . Transportation needs:    Medical: Not on file    Non-medical: Not on file  Tobacco Use  . Smoking status: Never Smoker  . Smokeless tobacco: Never Used  Substance and Sexual Activity  . Alcohol use: No  . Drug use: No  . Sexual activity: Not on file    Comment: Hysterectomy  Lifestyle  . Physical activity:    Days per week: Not on file    Minutes per session: Not on file  . Stress: Not on file  Relationships  . Social connections:    Talks on phone: Not on file    Gets together: Not on file    Attends religious  service: Not on file    Active member of club or organization: Not on file    Attends meetings of clubs or organizations: Not on file    Relationship status: Not on file  Other Topics Concern  . Not on file  Social History Narrative  . Not on file     Review of Systems: A 12 point ROS discussed and pertinent positives are indicated in the HPI above.  All other systems are negative.  Review of Systems  Vital Signs: BP 98/60   Pulse 67   Temp 98 F (36.7 C) (Oral)   Resp 16   Ht 5\' 1"  (1.549 m)   Wt 190 lb (86.2 kg)   SpO2 99%  BMI 35.90 kg/m   Physical Exam  Constitutional: She is oriented to person, place, and time. She appears well-developed. No distress.  HENT:  Head: Normocephalic.  Mouth/Throat: Oropharynx is clear and moist.  Neck: Normal range of motion. No JVD present.  Cardiovascular: Normal rate, regular rhythm and normal heart sounds.  Pulmonary/Chest: Effort normal and breath sounds normal. No respiratory distress.  Neurological: She is alert and oriented to person, place, and time.  Skin: Skin is warm and dry.  Psychiatric: She has a normal mood and affect.    Imaging: US Breast Ltd Uni Right Inc Axilla  Result Date: 06/15/2017 CLINICAL DATA:  63 year old patient presents for evaluation of the right breast. Her primary complaint is recent on set of heaviness of the right breast, with some pain. She denies skin erythema, skin darkening, or evidence of skin thickening. She does not palpate a lump. She is on dialysis, Monday, Wednesday, Friday. Her last mammogram was March 09, 2016. EXAM: DIGITAL DIAGNOSTIC BILATERAL MAMMOGRAM WITH CAD AND TOMO ULTRASOUND RIGHT BREAST COMPARISON:  March 09, 2016 ACR Breast Density Category c: The breast tissue is heterogeneously dense, which may obscure small masses. FINDINGS: Significant interval changes in the appearance of the right breast compared to the mammogram of 2018. There is right breast skin thickening, most  prominent in the region of the areola and periareolar skin. Diffusely increased and asymmetric density in the outer right breast, without a discrete mass. This increased density extends from the retroareolar region to the posterior third of the breast parenchyma. On tomographic images, there is suggestion of subtle architectural distortion in the posterior third of the upper outer right breast. There are new pleomorphic calcifications in the posterior third of the upper-outer quadrant of the right breast, within the area of increased density. These calcifications span approximately 2.0 cm in greatest diameter. No suspicious findings are identified in the left breast. Mammographic images were processed with CAD. On physical exam, the skin of the right breast is symmetric in color to the skin of the right breast. No skin erythema or warmth is appreciated. Skin of the right areola is diffusely thickened and asymmetric compared to the left side. Normal skin color is maintained in the right areola. No mass is palpated in the outer right breast. Targeted ultrasound is performed, showing patchy non masslike areas hypoechogenicity and shadowing are identified in the outer right breast, particularly in the 9-10 o'clock regions extending from approximately 3 cm from the nipple into the axillary tail region. This hypoechogenicity and shadowing is best seen with harmonics. Ultrasound of the right axilla shows a lymph node within normal limits for size and with a cortical thickness of 3-4 mm. No definite/grossly enlarged right axillary lymph nodes. IMPRESSION: Clinical and imaging findings suspicious for right breast cancer involving the majority of the outer right breast, with an inflammatory component. The patient states that her main symptom, heaviness of the right breast, developed recently. No evidence of malignancy in the left breast. RECOMMENDATION: Stereotactic biopsy of the discrete suspicious microcalcifications in the  posterior third of the upper outer right breast is recommended. These calcifications are located within the area of added density the mammogram. If these are positive for malignancy, then skin punch biopsy of the right areola would be suggested to evaluate for skin involvement. Findings have been discussed with the patient today and she is scheduled for a right breast stereotactic biopsy on Tuesday, June 19, 2017. I have discussed the findings and recommendations with the patient. Results  were also provided in writing at the conclusion of the visit. If applicable, a reminder letter will be sent to the patient regarding the next appointment. BI-RADS CATEGORY  5: Highly suggestive of malignancy. Electronically Signed   By: Curlene Dolphin M.D.   On: 06/15/2017 11:19   Mm Diag Breast Tomo Bilateral  Result Date: 06/15/2017 CLINICAL DATA:  62 year old patient presents for evaluation of the right breast. Her primary complaint is recent on set of heaviness of the right breast, with some pain. She denies skin erythema, skin darkening, or evidence of skin thickening. She does not palpate a lump. She is on dialysis, Monday, Wednesday, Friday. Her last mammogram was March 09, 2016. EXAM: DIGITAL DIAGNOSTIC BILATERAL MAMMOGRAM WITH CAD AND TOMO ULTRASOUND RIGHT BREAST COMPARISON:  March 09, 2016 ACR Breast Density Category c: The breast tissue is heterogeneously dense, which may obscure small masses. FINDINGS: Significant interval changes in the appearance of the right breast compared to the mammogram of 2018. There is right breast skin thickening, most prominent in the region of the areola and periareolar skin. Diffusely increased and asymmetric density in the outer right breast, without a discrete mass. This increased density extends from the retroareolar region to the posterior third of the breast parenchyma. On tomographic images, there is suggestion of subtle architectural distortion in the posterior third of the  upper outer right breast. There are new pleomorphic calcifications in the posterior third of the upper-outer quadrant of the right breast, within the area of increased density. These calcifications span approximately 2.0 cm in greatest diameter. No suspicious findings are identified in the left breast. Mammographic images were processed with CAD. On physical exam, the skin of the right breast is symmetric in color to the skin of the right breast. No skin erythema or warmth is appreciated. Skin of the right areola is diffusely thickened and asymmetric compared to the left side. Normal skin color is maintained in the right areola. No mass is palpated in the outer right breast. Targeted ultrasound is performed, showing patchy non masslike areas hypoechogenicity and shadowing are identified in the outer right breast, particularly in the 9-10 o'clock regions extending from approximately 3 cm from the nipple into the axillary tail region. This hypoechogenicity and shadowing is best seen with harmonics. Ultrasound of the right axilla shows a lymph node within normal limits for size and with a cortical thickness of 3-4 mm. No definite/grossly enlarged right axillary lymph nodes. IMPRESSION: Clinical and imaging findings suspicious for right breast cancer involving the majority of the outer right breast, with an inflammatory component. The patient states that her main symptom, heaviness of the right breast, developed recently. No evidence of malignancy in the left breast. RECOMMENDATION: Stereotactic biopsy of the discrete suspicious microcalcifications in the posterior third of the upper outer right breast is recommended. These calcifications are located within the area of added density the mammogram. If these are positive for malignancy, then skin punch biopsy of the right areola would be suggested to evaluate for skin involvement. Findings have been discussed with the patient today and she is scheduled for a right breast  stereotactic biopsy on Tuesday, June 19, 2017. I have discussed the findings and recommendations with the patient. Results were also provided in writing at the conclusion of the visit. If applicable, a reminder letter will be sent to the patient regarding the next appointment. BI-RADS CATEGORY  5: Highly suggestive of malignancy. Electronically Signed   By: Curlene Dolphin M.D.   On: 06/15/2017 11:19  Mm Clip Placement Right  Result Date: 06/19/2017 CLINICAL DATA:  Post stereotactic guided biopsy of suspicious calcifications in the upper-outer posterior right breast. EXAM: DIAGNOSTIC RIGHT MAMMOGRAM POST STEREOTACTIC BIOPSY COMPARISON:  Previous exam(s). FINDINGS: Mammographic images were obtained following stereotactic guided biopsy of calcifications in the upper-outer posterior right breast. A coil shaped biopsy marking clip is present at the site of biopsied calcifications in the upper-outer posterior right breast. IMPRESSION: Coil shaped biopsy marking clip at site of biopsied calcifications in the upper-outer posterior right breast. Final Assessment: Post Procedure Mammograms for Marker Placement Electronically Signed   By: Everlean Alstrom M.D.   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 This was found to be concordant by Dr. Everlean Alstrom. Pathology results were discussed with the patient by telephone. The patient reported doing well after the biopsy with tenderness at the site. Post biopsy instructions and care were reviewed and questions were answered. The patient was encouraged to call [facility] for any additional concerns. Surgical consultation has been arranged with Dr. Nedra Hai at [facility] on June 26, 2017. Skin punch biopsy of the right areola would be suggested to evaluate for skin involvement to rule out inflammatory breast cancer. Recommendation for a bilateral breast MRI for further evaluation of extent of disease and heterogeneously dense breast tissue. Pathology results reported by Terie Purser, RN on 06/20/2017. Electronically Signed   By: Everlean Alstrom M.D.   On: 06/20/2017 12:45   Result Date: 06/20/2017 CLINICAL DATA:  62 year old female with suspicious calcifications in the upper-outer posterior right breast. EXAM: RIGHT BREAST STEREOTACTIC CORE NEEDLE BIOPSY COMPARISON:  Previous exams. FINDINGS: The patient and I discussed the procedure of stereotactic-guided biopsy including benefits and alternatives. We discussed the high likelihood of a successful procedure. We discussed the risks of the procedure including infection, bleeding, tissue injury, clip migration, and inadequate sampling. Informed written consent was given. The usual time out protocol was performed immediately prior to the procedure. Using sterile technique and 1% Lidocaine as local anesthetic, under stereotactic guidance, a 9 gauge vacuum assisted device was used to perform core needle biopsy of the calcifications in the upper-outer posterior right breast using a superior to inferior approach. Specimen radiograph was performed showing the presence of calcifications. Specimens with calcifications are identified for pathology. Lesion quadrant: Upper-outer At the conclusion of the procedure, a coil shaped tissue marker clip was deployed into the biopsy cavity. Follow-up 2-view mammogram was performed and dictated separately. IMPRESSION: Stereotactic-guided biopsy of the calcifications in the upper-outer posterior right breast. No apparent complications. Electronically Signed: By: Everlean Alstrom M.D. On: 06/19/2017 09:52    Labs:  CBC: Recent Labs    12/29/16 1655 01/12/17 2043   WBC 3.0* 4.6  HGB 12.2 11.3*  HCT 36.4 35.7*  PLT 173 224    COAGS: No results for input(s): INR, APTT in the last 8760 hours.  BMP: Recent Labs    12/29/16 1655 01/12/17 2043  NA 140 138  K 3.4* 3.9  CL 100* 92*  CO2 31 30  GLUCOSE 130* 84  BUN 12 12  CALCIUM 8.8* 9.4  CREATININE 4.05* 4.27*  GFRNONAA 11* 10*  GFRAA 13* 12*  LIVER FUNCTION TESTS: No results for input(s): BILITOT, AST, ALT, ALKPHOS, PROT, ALBUMIN in the last 8760 hours.  TUMOR MARKERS: No results for input(s): AFPTM, CEA, CA199, CHROMGRNA in the last 8760 hours.  Assessment and Plan: Right sided breast cancer For port placement Labs pending Risks and benefits of image guided port-a-catheter placement was discussed with the patient including, but not limited to bleeding, infection, pneumothorax, or fibrin sheath development and need for additional procedures.  All of the patient's questions were answered, patient is agreeable to proceed. Consent signed and in chart.    Thank you for this interesting consult.  I greatly enjoyed meeting Karen [name] and look forward to participating in their care.  A copy of this report was sent to the requesting provider on this date.  Electronically Signed: Ascencion Dike, PA-C 07/05/2017, 7:27 AM   I spent a total of 20 minutes in face to face in clinical consultation, greater than 50% of which was counseling/coordinating care for port placement

## 2017-07-05 NOTE — Sedation Documentation (Signed)
Patient is resting comfortably. Sats stable, continue to monitor

## 2017-07-05 NOTE — Sedation Documentation (Signed)
Patient is resting comfortably. 

## 2017-07-05 NOTE — Procedures (Signed)
Pre Procedure Dx: Breast Cancer Post Procedural Dx: Same  Successful placement of right IJ approach port-a-cath with tip at the superior caval atrial junction. The catheter is ready for immediate use.  Estimated Blood Loss: Minimal  Complications: None immediate.  Ronny Bacon, MD Pager #: [phone]

## 2017-07-12 ENCOUNTER — Telehealth: Payer: Self-pay

## 2017-07-12 ENCOUNTER — Inpatient Hospital Stay: Payer: Medicare Other

## 2017-07-12 ENCOUNTER — Encounter: Payer: Self-pay | Admitting: Hematology and Oncology

## 2017-07-12 NOTE — Progress Notes (Signed)
Met with patient whom brought proof of income for [employer].  Patient approved for one-time $1000 grant. Patient has a copy of the approval as well as the expense sheet which I went over in detail. Also provided patient with outpatient pharmacy information. Answered questions patient had regarding grant. Patient had no immediate needs today. Patient has my card for any additional financial questions or concerns.

## 2017-07-12 NOTE — Telephone Encounter (Signed)
Received call from pt daugther, Sophia, regarding pt needing pathology results for her mom from 06/26/17. Pt daughter would like a phone consultation review with Dr.Gudena. Pt currently on hospice care. Will notify MD.

## 2017-07-13 ENCOUNTER — Telehealth: Payer: Self-pay | Admitting: Hematology and Oncology

## 2017-07-13 ENCOUNTER — Ambulatory Visit (HOSPITAL_COMMUNITY)
Admission: RE | Admit: 2017-07-13 | Discharge: 2017-07-13 | Disposition: A | Discharge status: Home / Self Care | Payer: Medicare Other | Source: Ambulatory Visit | Attending: Hematology and Oncology | Admitting: Hematology and Oncology

## 2017-07-13 DIAGNOSIS — I1311 Hypertensive heart and chronic kidney disease without heart failure, with stage 5 chronic kidney disease, or end stage renal disease: Secondary | ICD-10-CM | POA: Insufficient documentation

## 2017-07-13 DIAGNOSIS — Z171 Estrogen receptor negative status [ER-]: Secondary | ICD-10-CM | POA: Insufficient documentation

## 2017-07-13 DIAGNOSIS — C50411 Malignant neoplasm of upper-outer quadrant of right female breast: Secondary | ICD-10-CM | POA: Diagnosis not present

## 2017-07-13 DIAGNOSIS — I071 Rheumatic tricuspid insufficiency: Secondary | ICD-10-CM | POA: Insufficient documentation

## 2017-07-13 DIAGNOSIS — N186 End stage renal disease: Secondary | ICD-10-CM | POA: Diagnosis not present

## 2017-07-13 DIAGNOSIS — K219 Gastro-esophageal reflux disease without esophagitis: Secondary | ICD-10-CM | POA: Diagnosis not present

## 2017-07-13 DIAGNOSIS — R011 Cardiac murmur, unspecified: Secondary | ICD-10-CM | POA: Insufficient documentation

## 2017-07-13 DIAGNOSIS — R0600 Dyspnea, unspecified: Secondary | ICD-10-CM | POA: Diagnosis not present

## 2017-07-13 DIAGNOSIS — I209 Angina pectoris, unspecified: Secondary | ICD-10-CM | POA: Diagnosis not present

## 2017-07-13 NOTE — Telephone Encounter (Signed)
Called pt re appts being moved per 6/27 sch msg - spoke w/ pt re appts.

## 2017-07-13 NOTE — Progress Notes (Signed)
  Echocardiogram 2D Echocardiogram has been performed.  Catrice Zuleta G Damiah Mcdonald 07/13/2017, 11:09 AM

## 2017-07-16 ENCOUNTER — Other Ambulatory Visit: Payer: Medicare Other

## 2017-07-16 ENCOUNTER — Ambulatory Visit: Payer: Medicare Other

## 2017-07-16 ENCOUNTER — Ambulatory Visit: Payer: Medicare Other | Admitting: Hematology and Oncology

## 2017-07-17 ENCOUNTER — Encounter: Payer: Self-pay | Admitting: *Deleted

## 2017-07-17 ENCOUNTER — Inpatient Hospital Stay: Payer: Medicare Other | Attending: Hematology and Oncology | Admitting: Hematology and Oncology

## 2017-07-17 ENCOUNTER — Inpatient Hospital Stay: Payer: Medicare Other

## 2017-07-17 ENCOUNTER — Telehealth: Payer: Self-pay

## 2017-07-17 VITALS — BP 121/71 | HR 78 | Temp 97.5°F | Resp 16

## 2017-07-17 DIAGNOSIS — E1122 Type 2 diabetes mellitus with diabetic chronic kidney disease: Secondary | ICD-10-CM | POA: Insufficient documentation

## 2017-07-17 DIAGNOSIS — Z79899 Other long term (current) drug therapy: Secondary | ICD-10-CM

## 2017-07-17 DIAGNOSIS — D6481 Anemia due to antineoplastic chemotherapy: Secondary | ICD-10-CM | POA: Insufficient documentation

## 2017-07-17 DIAGNOSIS — Z171 Estrogen receptor negative status [ER-]: Principal | ICD-10-CM

## 2017-07-17 DIAGNOSIS — K219 Gastro-esophageal reflux disease without esophagitis: Secondary | ICD-10-CM | POA: Insufficient documentation

## 2017-07-17 DIAGNOSIS — Z5111 Encounter for antineoplastic chemotherapy: Secondary | ICD-10-CM | POA: Diagnosis not present

## 2017-07-17 DIAGNOSIS — C50411 Malignant neoplasm of upper-outer quadrant of right female breast: Secondary | ICD-10-CM | POA: Diagnosis not present

## 2017-07-17 DIAGNOSIS — D631 Anemia in chronic kidney disease: Secondary | ICD-10-CM | POA: Diagnosis not present

## 2017-07-17 DIAGNOSIS — N186 End stage renal disease: Secondary | ICD-10-CM | POA: Diagnosis not present

## 2017-07-17 DIAGNOSIS — I129 Hypertensive chronic kidney disease with stage 1 through stage 4 chronic kidney disease, or unspecified chronic kidney disease: Secondary | ICD-10-CM | POA: Diagnosis not present

## 2017-07-17 DIAGNOSIS — Z992 Dependence on renal dialysis: Secondary | ICD-10-CM | POA: Insufficient documentation

## 2017-07-17 DIAGNOSIS — Z95828 Presence of other vascular implants and grafts: Secondary | ICD-10-CM

## 2017-07-17 LAB — CBC WITH DIFFERENTIAL (CANCER CENTER ONLY)
BASOS ABS: 0 10*3/uL (ref 0.0–0.1)
Basophils Relative: 0 %
Eosinophils Absolute: 0.3 10*3/uL (ref 0.0–0.5)
Eosinophils Relative: 7 %
HCT: 31.9 % — ABNORMAL LOW (ref 34.8–46.6)
HEMOGLOBIN: 10.4 g/dL — AB (ref 11.6–15.9)
LYMPHS ABS: 0.7 10*3/uL — AB (ref 0.9–3.3)
Lymphocytes Relative: 21 %
MCH: 31.7 pg (ref 25.1–34.0)
MCHC: 32.6 g/dL (ref 31.5–36.0)
MCV: 97.5 fL (ref 79.5–101.0)
Monocytes Absolute: 0.5 10*3/uL (ref 0.1–0.9)
Monocytes Relative: 13 %
NEUTROS PCT: 59 %
Neutro Abs: 2.1 10*3/uL (ref 1.5–6.5)
Platelet Count: 219 10*3/uL (ref 145–400)
RBC: 3.27 MIL/uL — AB (ref 3.70–5.45)
RDW: 16.9 % — ABNORMAL HIGH (ref 11.2–14.5)
WBC: 3.5 10*3/uL — AB (ref 3.9–10.3)

## 2017-07-17 LAB — CMP (CANCER CENTER ONLY)
ALBUMIN: 3.6 g/dL (ref 3.5–5.0)
ALT: <6 U/L (ref 0–44)
AST: 18 U/L (ref 15–41)
Alkaline Phosphatase: 139 U/L — ABNORMAL HIGH (ref 38–126)
Anion gap: 11 (ref 5–15)
BILIRUBIN TOTAL: 0.4 mg/dL (ref 0.3–1.2)
BUN: 29 mg/dL — ABNORMAL HIGH (ref 8–23)
CHLORIDE: 98 mmol/L (ref 98–111)
CO2: 34 mmol/L — ABNORMAL HIGH (ref 22–32)
CREATININE: 6.42 mg/dL — AB (ref 0.44–1.00)
Calcium: 10.1 mg/dL (ref 8.9–10.3)
GFR, EST AFRICAN AMERICAN: 7 mL/min — AB (ref >60)
GFR, EST NON AFRICAN AMERICAN: 6 mL/min — AB (ref >60)
Glucose, Bld: 77 mg/dL (ref 70–99)
POTASSIUM: 4.1 mmol/L (ref 3.5–5.1)
SODIUM: 143 mmol/L (ref 135–145)
TOTAL PROTEIN: 6.9 g/dL (ref 6.5–8.1)

## 2017-07-17 MED ORDER — ACETAMINOPHEN 325 MG PO TABS
650.0000 mg | ORAL_TABLET | Freq: Once | ORAL | Status: AC
  Administered 2017-07-17: 650 mg via ORAL

## 2017-07-17 MED ORDER — DIPHENHYDRAMINE HCL 50 MG/ML IJ SOLN
25.0000 mg | Freq: Once | INTRAMUSCULAR | Status: AC
  Administered 2017-07-17: 25 mg via INTRAVENOUS

## 2017-07-17 MED ORDER — SODIUM CHLORIDE 0.9% FLUSH
10.0000 mL | INTRAVENOUS | Status: DC | PRN
  Administered 2017-07-17: 10 mL
  Filled 2017-07-17: qty 10

## 2017-07-17 MED ORDER — DEXAMETHASONE SODIUM PHOSPHATE 10 MG/ML IJ SOLN
INTRAMUSCULAR | Status: AC
  Filled 2017-07-17: qty 1

## 2017-07-17 MED ORDER — TRASTUZUMAB CHEMO 150 MG IV SOLR
4.0000 mg/kg | Freq: Once | INTRAVENOUS | Status: AC
  Administered 2017-07-17: 357 mg via INTRAVENOUS
  Filled 2017-07-17: qty 17

## 2017-07-17 MED ORDER — DIPHENHYDRAMINE HCL 50 MG/ML IJ SOLN
INTRAMUSCULAR | Status: AC
Start: 2017-07-17 — End: ?
  Filled 2017-07-17: qty 1

## 2017-07-17 MED ORDER — FAMOTIDINE IN NACL 20-0.9 MG/50ML-% IV SOLN
INTRAVENOUS | Status: AC
  Filled 2017-07-17: qty 50

## 2017-07-17 MED ORDER — SODIUM CHLORIDE 0.9 % IV SOLN
Freq: Once | INTRAVENOUS | Status: AC
  Administered 2017-07-17: 10:00:00 via INTRAVENOUS

## 2017-07-17 MED ORDER — FAMOTIDINE IN NACL 20-0.9 MG/50ML-% IV SOLN
20.0000 mg | Freq: Once | INTRAVENOUS | Status: AC
  Administered 2017-07-17: 20 mg via INTRAVENOUS

## 2017-07-17 MED ORDER — SODIUM CHLORIDE 0.9 % IV SOLN
10.0000 mg | Freq: Once | INTRAVENOUS | Status: DC
  Filled 2017-07-17: qty 1

## 2017-07-17 MED ORDER — HEPARIN SOD (PORK) LOCK FLUSH 100 UNIT/ML IV SOLN
500.0000 [IU] | Freq: Once | INTRAVENOUS | Status: AC | PRN
  Administered 2017-07-17: 500 [IU]
  Filled 2017-07-17: qty 5

## 2017-07-17 MED ORDER — DEXAMETHASONE SODIUM PHOSPHATE 10 MG/ML IJ SOLN
10.0000 mg | Freq: Once | INTRAMUSCULAR | Status: AC
  Administered 2017-07-17: 10 mg via INTRAVENOUS

## 2017-07-17 MED ORDER — ACETAMINOPHEN 325 MG PO TABS
ORAL_TABLET | ORAL | Status: AC
  Filled 2017-07-17: qty 2

## 2017-07-17 MED ORDER — SODIUM CHLORIDE 0.9 % IV SOLN
80.0000 mg/m2 | Freq: Once | INTRAVENOUS | Status: AC
  Administered 2017-07-17: 156 mg via INTRAVENOUS
  Filled 2017-07-17: qty 26

## 2017-07-17 NOTE — Telephone Encounter (Signed)
Per Suanne Marker in lab, Creatinine is 6.4.  Notified Dr Lindi Adie. No new orders received at this time.

## 2017-07-17 NOTE — Assessment & Plan Note (Signed)
06/19/2017: Right breast skin thickening and heaviness: Screening mammogram detected 2 cm of microcalcifications in the right breast in the posterior third UOQ.?  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  2. neoadjuvant chemotherapy with Taxol Herceptin weekly x12 followed by Herceptin maintenance for 1 year 3.  Followed by surgery with either lumpectomy or mastectomy depending on the MRI findings and treatment response 4.  Followed by adjuvant radiation if Karen [name] undergoes breast conserving surgery 5.  Followed by adjuvant antiestrogen therapy  -------------------------------------------------------------------------------- Current treatment: Cycle 1 day 1 Taxol Herceptin Labs have been reviewed Antiemetics were reviewed Echocardiogram reviewed Return to clinic in 1 week for toxicity check

## 2017-07-17 NOTE — Progress Notes (Signed)
Patient Care Team: Lucianne Lei, MD as PCP - General (Family Medicine) Donato Heinz, MD (Nephrology) Lorretta Harp, MD as Consulting Physician (Cardiology) Pyrtle, Lajuan Lines, MD as Consulting Physician (Gastroenterology)  DIAGNOSIS:  Encounter Diagnosis  Name Primary?  . Malignant neoplasm of upper-outer quadrant of right breast in female, estrogen receptor negative ([place])     SUMMARY OF ONCOLOGIC HISTORY:   Malignant neoplasm of upper-outer quadrant of right breast in female, estrogen receptor negative ([place])   06/19/2017 Initial Diagnosis    Right breast skin thickening and heaviness: Screening mammogram detected 2 cm of microcalcifications in the right breast in the posterior third UOQ.?  Skin involvement, stereotactic biopsy revealed IDC grade 2-3 with high-grade DCIS, lymphovascular invasion present, ER 0%, PR 0%, HER-2 positive ratio 8.69, gene copy #11.3 T1CN0 stage Ia      07/02/2017 Cancer Staging    Staging form: Breast, AJCC 8th Edition - Clinical: Stage IA (cT1c, cN0, cM0, G3, ER-, PR-, HER2+) - Signed by Nicholas Lose, MD on 07/02/2017      07/02/2017 -  Chemotherapy    The patient had trastuzumab (HERCEPTIN) 357 mg in sodium chloride 0.9 % 250 mL chemo infusion, 4 mg/kg = 357 mg, Intravenous,  Once, 0 of 3 cycles PACLitaxel (TAXOL) 156 mg in sodium chloride 0.9 % 250 mL chemo infusion (</= 10m/m2), 80 mg/m2 = 156 mg, Intravenous,  Once, 0 of 3 cycles  for chemotherapy treatment.        CHIEF COMPLIANT: Cycle 1 day 1 Taxol Herceptin  INTERVAL HISTORY: MDanira Nylanderis a 62year old with above-mentioned history of HER-2 positive right breast cancer who is here today to start her first cycle of treatment with Taxol and Herceptin.  She is anxious to get started the treatment.  REVIEW OF SYSTEMS:   Constitutional: Denies fevers, chills or abnormal weight loss Eyes: Denies blurriness of vision Ears, nose, mouth, throat, and face: Denies mucositis or sore  throat Respiratory: Denies cough, dyspnea or wheezes Cardiovascular: Denies palpitation, chest discomfort Gastrointestinal:  Denies nausea, heartburn or change in bowel habits Skin: Denies abnormal skin rashes Lymphatics: Denies new lymphadenopathy or easy bruising Neurological:Denies numbness, tingling or new weaknesses Behavioral/Psych: Mood is stable, no new changes  Extremities: No lower extremity edema Breast: Tenderness in the left breast All other systems were reviewed with the patient and are negative.  I have reviewed the past medical history, past surgical history, social history and family history with the patient and they are unchanged from previous note.  ALLERGIES:  has No Known Allergies.  MEDICATIONS:  Current Outpatient Medications  Medication Sig Dispense Refill  . acetaminophen (TYLENOL) 500 MG tablet Take 1,000 mg by mouth daily as needed for mild pain, moderate pain or headache.     .Marland Kitchenamoxicillin (AMOXIL) 500 MG capsule Take 2,000 mg by mouth See admin instructions. Take 4 capsules (2000 mg) by mouth one hour prior to dental procedures    . aspirin EC 81 MG tablet Take 81 mg by mouth daily.    . cephALEXin (KEFLEX) 500 MG capsule Take 1 capsule by mouth See admin instructions. Take 1 capsule daily ONLY after dialysis on Mon Wed Fri    . Darbepoetin Alfa-Albumin (ARANESP IJ) Inject as directed every 30 (thirty) days.    .Marland Kitchendiltiazem (CARDIZEM) 30 MG tablet Take 1/2 tablet the morning of dialysis (Patient taking differently: Take 15 mg by mouth See admin instructions. Take 1/2 tablet the morning of dialysis) 45 tablet 1  . lidocaine-prilocaine (EMLA) cream  Apply 1 application topically See admin instructions. Apply topically Monday, Wednesday and Friday before dialysis  6  . metoprolol succinate (TOPROL-XL) 100 MG 24 hr tablet Take 50 mg by mouth at bedtime. Take with or immediately following a meal.     . multivitamin (RENA-VIT) TABS tablet Take 1 tablet by mouth daily.     . ondansetron (ZOFRAN) 8 MG tablet Take 1 tablet (8 mg total) by mouth 2 (two) times daily as needed (Nausea or vomiting). 30 tablet 1  . pantoprazole (PROTONIX) 40 MG tablet TAKE 1 TABLET (40 MG TOTAL) BY MOUTH DAILY. 30 tablet 4  . prochlorperazine (COMPAZINE) 10 MG tablet Take 1 tablet (10 mg total) by mouth every 6 (six) hours as needed (Nausea or vomiting). 30 tablet 1  . sevelamer carbonate (RENVELA) 800 MG tablet Take 800-2,400 mg by mouth See admin instructions. 2,461m three times daily and 8063mtwice daily with snacks    . simvastatin (ZOCOR) 20 MG tablet Take 1 tablet (20 mg total) by mouth at bedtime. 30 tablet 11   No current facility-administered medications for this visit.    Facility-Administered Medications Ordered in Other Visits  Medication Dose Route Frequency Provider Last Rate Last Dose  . sodium chloride flush (NS) 0.9 % injection 10 mL  10 mL Intracatheter PRN GuNicholas LoseMD   10 mL at 07/17/17 0751    PHYSICAL EXAMINATION: ECOG PERFORMANCE STATUS: 1 - Symptomatic but completely ambulatory  There were no vitals filed for this visit. There were no vitals filed for this visit.  GENERAL:alert, no distress and comfortable SKIN: skin color, texture, turgor are normal, no rashes or significant lesions EYES: normal, Conjunctiva are pink and non-injected, sclera clear OROPHARYNX:no exudate, no erythema and lips, buccal mucosa, and tongue normal  NECK: supple, thyroid normal size, non-tender, without nodularity LYMPH:  no palpable lymphadenopathy in the cervical, axillary or inguinal LUNGS: clear to auscultation and percussion with normal breathing effort HEART: regular rate & rhythm and no murmurs and no lower extremity edema ABDOMEN:abdomen soft, non-tender and normal bowel sounds MUSCULOSKELETAL:no cyanosis of digits and no clubbing  NEURO: alert & oriented x 3 with fluent speech, no focal motor/sensory deficits EXTREMITIES: No lower extremity  edema  LABORATORY DATA:  I have reviewed the data as listed CMP Latest Ref Rng & Units 07/05/2017 01/12/2017 12/29/2016  Glucose 65 - 99 mg/dL 90 84 130(H)  BUN 6 - 20 mg/dL 24(H) 12 12  Creatinine 0.44 - 1.00 mg/dL 5.51(H) 4.27(H) 4.05(H)  Sodium 135 - 145 mmol/L 144 138 140  Potassium 3.5 - 5.1 mmol/L 4.0 3.9 3.4(L)  Chloride 101 - 111 mmol/L 100(L) 92(L) 100(L)  CO2 22 - 32 mmol/L 33(H) 30 31  Calcium 8.9 - 10.3 mg/dL 9.6 9.4 8.8(L)  Total Protein 6.5 - 8.1 g/dL - - -  Total Bilirubin 0.3 - 1.2 mg/dL - - -  Alkaline Phos 38 - 126 U/L - - -  AST 15 - 41 U/L - - -  ALT 14 - 54 U/L - - -    Lab Results  Component Value Date   WBC 3.5 (L) 07/17/2017   HGB 10.4 (L) 07/17/2017   HCT 31.9 (L) 07/17/2017   MCV 97.5 07/17/2017   PLT 219 07/17/2017   NEUTROABS 2.1 07/17/2017    ASSESSMENT & PLAN:  Malignant neoplasm of upper-outer quadrant of right breast in [place], [place]: Right breast skin thickening and heaviness: Screening mammogram detected 2 cm of microcalcifications in the right  breast in the posterior third UOQ.?  Skin involvement, stereotactic biopsy revealed IDC grade 2-3 with high-grade DCIS, lymphovascular invasion present, ER 0%, PR 0%, HER-2 positive ratio 8.69, gene copy #11.3  Recommendation: Breast MRI could not be done because the patient is on hemodialysis. 2. neoadjuvant chemotherapy with Taxol Herceptin weekly x12 followed by Herceptin maintenance for 1 year 3.  Followed by surgery with either lumpectomy or mastectomy depending on the MRI findings and treatment response 4.  Followed by adjuvant radiation if she undergoes breast conserving surgery 5.  Followed by adjuvant antiestrogen therapy  -------------------------------------------------------------------------------- Current treatment: Cycle 1 day 1 Taxol Herceptin Labs have been reviewed Antiemetics were reviewed Echocardiogram reviewed EF 60 to 65%  Return to clinic  in 1 week for toxicity check  No orders of the defined types were placed in this encounter.  The patient has a good understanding of the overall plan. she agrees with it. she will call with any problems that may develop before the next visit here.   Harriette Ohara, MD 07/17/17

## 2017-07-17 NOTE — Patient Instructions (Addendum)
Paclitaxel injection What is this medicine? PACLITAXEL (PAK li TAX el) is a chemotherapy drug. It targets fast dividing cells, like cancer cells, and causes these cells to die. This medicine is used to treat ovarian cancer, breast cancer, and other cancers. This medicine may be used for other purposes; ask your health care provider or pharmacist if you have questions. COMMON BRAND NAME(S): Onxol, Taxol What should I tell my health care provider before I take this medicine? They need to know if you have any of these conditions: -blood disorders -irregular heartbeat -infection (especially a virus infection such as chickenpox, cold sores, or herpes) -liver disease -previous or ongoing radiation therapy -an unusual or allergic reaction to paclitaxel, alcohol, polyoxyethylated castor oil, other chemotherapy agents, other medicines, foods, dyes, or preservatives -pregnant or trying to get pregnant -breast-feeding How should I use this medicine? This drug is given as an infusion into a vein. It is administered in a hospital or clinic by a specially trained health care professional. Talk to your pediatrician regarding the use of this medicine in children. Special care may be needed. Overdosage: If you think you have taken too much of this medicine contact a poison control center or emergency room at once. NOTE: This medicine is only for you. Do not share this medicine with others. What if I miss a dose? It is important not to miss your dose. Call your doctor or health care professional if you are unable to keep an appointment. What may interact with this medicine? Do not take this medicine with any of the following medications: -disulfiram -metronidazole This medicine may also interact with the following medications: -cyclosporine -diazepam -ketoconazole -medicines to increase blood counts like filgrastim, pegfilgrastim, sargramostim -other chemotherapy drugs like cisplatin, doxorubicin,  epirubicin, etoposide, teniposide, vincristine -quinidine -testosterone -vaccines -verapamil Talk to your doctor or health care professional before taking any of these medicines: -acetaminophen -aspirin -ibuprofen -ketoprofen -naproxen This list may not describe all possible interactions. Give your health care provider a list of all the medicines, herbs, non-prescription drugs, or dietary supplements you use. Also tell them if you smoke, drink alcohol, or use illegal drugs. Some items may interact with your medicine. What should I watch for while using this medicine? Your condition will be monitored carefully while you are receiving this medicine. You will need important blood work done while you are taking this medicine. This medicine can cause serious allergic reactions. To reduce your risk you will need to take other medicine(s) before treatment with this medicine. If you experience allergic reactions like skin rash, itching or hives, swelling of the face, lips, or tongue, tell your doctor or health care professional right away. In some cases, you may be given additional medicines to help with side effects. Follow all directions for their use. This drug may make you feel generally unwell. This is not uncommon, as chemotherapy can affect healthy cells as well as cancer cells. Report any side effects. Continue your course of treatment even though you feel ill unless your doctor tells you to stop. Call your doctor or health care professional for advice if you get a fever, chills or sore throat, or other symptoms of a cold or flu. Do not treat yourself. This drug decreases your body's ability to fight infections. Try to avoid being around people who are sick. This medicine may increase your risk to bruise or bleed. Call your doctor or health care professional if you notice any unusual bleeding. Be careful brushing and flossing your teeth or   using a toothpick because you may get an infection or  bleed more easily. If you have any dental work done, tell your dentist you are receiving this medicine. Avoid taking products that contain aspirin, acetaminophen, ibuprofen, naproxen, or ketoprofen unless instructed by your doctor. These medicines may hide a fever. Do not become pregnant while taking this medicine. Women should inform their doctor if they wish to become pregnant or think they might be pregnant. There is a potential for serious side effects to an unborn child. Talk to your health care professional or pharmacist for more information. Do not breast-feed an infant while taking this medicine. Men are advised not to father a child while receiving this medicine. This product may contain alcohol. Ask your pharmacist or healthcare provider if this medicine contains alcohol. Be sure to tell all healthcare providers you are taking this medicine. Certain medicines, like metronidazole and disulfiram, can cause an unpleasant reaction when taken with alcohol. The reaction includes flushing, headache, nausea, vomiting, sweating, and increased thirst. The reaction can last from 30 minutes to several hours. What side effects may I notice from receiving this medicine? Side effects that you should report to your doctor or health care professional as soon as possible: -allergic reactions like skin rash, itching or hives, swelling of the face, lips, or tongue -low blood counts - This drug may decrease the number of white blood cells, red blood cells and platelets. You may be at increased risk for infections and bleeding. -signs of infection - fever or chills, cough, sore throat, pain or difficulty passing urine -signs of decreased platelets or bleeding - bruising, pinpoint red spots on the skin, black, tarry stools, nosebleeds -signs of decreased red blood cells - unusually weak or tired, fainting spells, lightheadedness -breathing problems -chest pain -high or low blood pressure -mouth sores -nausea and  vomiting -pain, swelling, redness or irritation at the injection site -pain, tingling, numbness in the hands or feet -slow or irregular heartbeat -swelling of the ankle, feet, hands Side effects that usually do not require medical attention (report to your doctor or health care professional if they continue or are bothersome): -bone pain -complete hair loss including hair on your head, underarms, pubic hair, eyebrows, and eyelashes -changes in the color of fingernails -diarrhea -loosening of the fingernails -loss of appetite -muscle or joint pain -red flush to skin -sweating This list may not describe all possible side effects. Call your doctor for medical advice about side effects. You may report side effects to FDA at 1-800-FDA-1088. Where should I keep my medicine? This drug is given in a hospital or clinic and will not be stored at home. NOTE: This sheet is a summary. It may not cover all possible information. If you have questions about this medicine, talk to your doctor, pharmacist, or health care provider.  2018 Elsevier/Gold Standard (2014-11-03 19:58:00)    Trastuzumab injection for infusion What is this medicine? TRASTUZUMAB (tras TOO zoo mab) is a monoclonal antibody. It is used to treat breast cancer and stomach cancer. This medicine may be used for other purposes; ask your health care provider or pharmacist if you have questions. COMMON BRAND NAME(S): Herceptin What should I tell my health care provider before I take this medicine? They need to know if you have any of these conditions: -heart disease -heart failure -lung or breathing disease, like asthma -an unusual or allergic reaction to trastuzumab, benzyl alcohol, or other medications, foods, dyes, or preservatives -pregnant or trying to get pregnant -breast-feeding  How should I use this medicine? This drug is given as an infusion into a vein. It is administered in a hospital or clinic by a specially trained  health care professional. Talk to your pediatrician regarding the use of this medicine in children. This medicine is not approved for use in children. Overdosage: If you think you have taken too much of this medicine contact a poison control center or emergency room at once. NOTE: This medicine is only for you. Do not share this medicine with others. What if I miss a dose? It is important not to miss a dose. Call your doctor or health care professional if you are unable to keep an appointment. What may interact with this medicine? This medicine may interact with the following medications: -certain types of chemotherapy, such as daunorubicin, doxorubicin, epirubicin, and idarubicin This list may not describe all possible interactions. Give your health care provider a list of all the medicines, herbs, non-prescription drugs, or dietary supplements you use. Also tell them if you smoke, drink alcohol, or use illegal drugs. Some items may interact with your medicine. What should I watch for while using this medicine? Visit your doctor for checks on your progress. Report any side effects. Continue your course of treatment even though you feel ill unless your doctor tells you to stop. Call your doctor or health care professional for advice if you get a fever, chills or sore throat, or other symptoms of a cold or flu. Do not treat yourself. Try to avoid being around people who are sick. You may experience fever, chills and shaking during your first infusion. These effects are usually mild and can be treated with other medicines. Report any side effects during the infusion to your health care professional. Fever and chills usually do not happen with later infusions. Do not become pregnant while taking this medicine or for 7 months after stopping it. Women should inform their doctor if they wish to become pregnant or think they might be pregnant. Women of child-bearing potential will need to have a negative  pregnancy test before starting this medicine. There is a potential for serious side effects to an unborn child. Talk to your health care professional or pharmacist for more information. Do not breast-feed an infant while taking this medicine or for 7 months after stopping it. Women must use effective birth control with this medicine. What side effects may I notice from receiving this medicine? Side effects that you should report to your doctor or health care professional as soon as possible: -allergic reactions like skin rash, itching or hives, swelling of the face, lips, or tongue -chest pain or palpitations -cough -dizziness -feeling faint or lightheaded, falls -fever -general ill feeling or flu-like symptoms -signs of worsening heart failure like breathing problems; swelling in your legs and feet -unusually weak or tired Side effects that usually do not require medical attention (report to your doctor or health care professional if they continue or are bothersome): -bone pain -changes in taste -diarrhea -joint pain -nausea/vomiting -weight loss This list may not describe all possible side effects. Call your doctor for medical advice about side effects. You may report side effects to FDA at 1-800-FDA-1088. Where should I keep my medicine? This drug is given in a hospital or clinic and will not be stored at home. NOTE: This sheet is a summary. It may not cover all possible information. If you have questions about this medicine, talk to your doctor, pharmacist, or health care provider.  2018  Elsevier/Gold Standard (2015-12-28 14:37:52)   [facility] Discharge Instructions for Patients Receiving Chemotherapy  Today you received the following chemotherapy agents Taxol and Herceptin  To help prevent nausea and vomiting after your treatment, we encourage you to take your nausea medicationas directed  If you develop nausea and vomiting that is not controlled by your nausea  medication, call the clinic.   BELOW ARE SYMPTOMS THAT SHOULD BE REPORTED IMMEDIATELY:  *FEVER GREATER THAN 100.5 F  *CHILLS WITH OR WITHOUT FEVER  NAUSEA AND VOMITING THAT IS NOT CONTROLLED WITH YOUR NAUSEA MEDICATION  *UNUSUAL SHORTNESS OF BREATH  *UNUSUAL BRUISING OR BLEEDING  TENDERNESS IN MOUTH AND THROAT WITH OR WITHOUT PRESENCE OF ULCERS  *URINARY PROBLEMS  *BOWEL PROBLEMS  UNUSUAL RASH Items with * indicate a potential emergency and should be followed up as soon as possible.  Feel free to call the clinic should you have any questions or concerns. The clinic phone number is [phone].  Please show the [place] at check-in to the Emergency Department and triage nurse.

## 2017-07-18 ENCOUNTER — Encounter: Payer: Self-pay | Admitting: Hematology and Oncology

## 2017-07-18 NOTE — Progress Notes (Signed)
Received notification funds were available for diagnosis through [employer].  Called patient and asked permission to apply. Attempted to apply online but received an error message.  Called [employer](Janae) to complete application via phone. Patient approved for up to $4000 07/18/17 - 07/19/18 with a retro date of 01/29/17. No additional information needed or requested from [employer]. A copy of the approval letter will be sent to me via fax and I will forward to [facility] for billing purposes. A copy will also be mailed to the patient for her records. Called patient to advise. She verbalized understanding and has my name and number for any additional questions or concerns.

## 2017-07-21 ENCOUNTER — Other Ambulatory Visit: Payer: Self-pay | Admitting: Cardiovascular Disease

## 2017-07-23 NOTE — Telephone Encounter (Signed)
Rx request sent to pharmacy.  

## 2017-07-24 ENCOUNTER — Inpatient Hospital Stay (HOSPITAL_BASED_OUTPATIENT_CLINIC_OR_DEPARTMENT_OTHER): Payer: Medicare Other | Admitting: Hematology and Oncology

## 2017-07-24 ENCOUNTER — Inpatient Hospital Stay (HOSPITAL_COMMUNITY): Admission: RE | Admit: 2017-07-24 | Payer: Medicare Other | Source: Ambulatory Visit

## 2017-07-24 ENCOUNTER — Inpatient Hospital Stay: Payer: Medicare Other

## 2017-07-24 ENCOUNTER — Ambulatory Visit (HOSPITAL_COMMUNITY): Payer: Medicare Other

## 2017-07-24 DIAGNOSIS — D6481 Anemia due to antineoplastic chemotherapy: Secondary | ICD-10-CM | POA: Diagnosis not present

## 2017-07-24 DIAGNOSIS — Z95828 Presence of other vascular implants and grafts: Secondary | ICD-10-CM

## 2017-07-24 DIAGNOSIS — Z79899 Other long term (current) drug therapy: Secondary | ICD-10-CM

## 2017-07-24 DIAGNOSIS — C50411 Malignant neoplasm of upper-outer quadrant of right female breast: Secondary | ICD-10-CM

## 2017-07-24 DIAGNOSIS — Z171 Estrogen receptor negative status [ER-]: Principal | ICD-10-CM

## 2017-07-24 DIAGNOSIS — D631 Anemia in chronic kidney disease: Secondary | ICD-10-CM | POA: Diagnosis not present

## 2017-07-24 DIAGNOSIS — N189 Chronic kidney disease, unspecified: Secondary | ICD-10-CM

## 2017-07-24 DIAGNOSIS — Z5111 Encounter for antineoplastic chemotherapy: Secondary | ICD-10-CM | POA: Diagnosis not present

## 2017-07-24 LAB — CMP (CANCER CENTER ONLY)
ALT: 17 U/L (ref 0–44)
ANION GAP: 10 (ref 5–15)
AST: 22 U/L (ref 15–41)
Albumin: 3.7 g/dL (ref 3.5–5.0)
Alkaline Phosphatase: 114 U/L (ref 38–126)
BILIRUBIN TOTAL: 0.4 mg/dL (ref 0.3–1.2)
BUN: 25 mg/dL — ABNORMAL HIGH (ref 8–23)
CALCIUM: 9.8 mg/dL (ref 8.9–10.3)
CO2: 34 mmol/L — ABNORMAL HIGH (ref 22–32)
Chloride: 98 mmol/L (ref 98–111)
Creatinine: 6.02 mg/dL (ref 0.44–1.00)
GFR, EST NON AFRICAN AMERICAN: 7 mL/min — AB (ref >60)
GFR, Est AFR Am: 8 mL/min — ABNORMAL LOW (ref >60)
Glucose, Bld: 91 mg/dL (ref 70–99)
POTASSIUM: 4.5 mmol/L (ref 3.5–5.1)
Sodium: 142 mmol/L (ref 135–145)
TOTAL PROTEIN: 7.1 g/dL (ref 6.5–8.1)

## 2017-07-24 LAB — CBC WITH DIFFERENTIAL (CANCER CENTER ONLY)
BASOS ABS: 0 10*3/uL (ref 0.0–0.1)
BASOS PCT: 0 %
EOS PCT: 8 %
Eosinophils Absolute: 0.2 10*3/uL (ref 0.0–0.5)
HEMATOCRIT: 29.3 % — AB (ref 34.8–46.6)
Hemoglobin: 9.7 g/dL — ABNORMAL LOW (ref 11.6–15.9)
LYMPHS PCT: 34 %
Lymphs Abs: 0.7 10*3/uL — ABNORMAL LOW (ref 0.9–3.3)
MCH: 32.2 pg (ref 25.1–34.0)
MCHC: 33.1 g/dL (ref 31.5–36.0)
MCV: 97.3 fL (ref 79.5–101.0)
Monocytes Absolute: 0.1 10*3/uL (ref 0.1–0.9)
Monocytes Relative: 5 %
NEUTROS ABS: 1.1 10*3/uL — AB (ref 1.5–6.5)
Neutrophils Relative %: 53 %
PLATELETS: 200 10*3/uL (ref 145–400)
RBC: 3.01 MIL/uL — AB (ref 3.70–5.45)
RDW: 16.5 % — ABNORMAL HIGH (ref 11.2–14.5)
WBC: 2 10*3/uL — AB (ref 3.9–10.3)

## 2017-07-24 MED ORDER — DIPHENHYDRAMINE HCL 50 MG/ML IJ SOLN
INTRAMUSCULAR | Status: AC
  Filled 2017-07-24: qty 1

## 2017-07-24 MED ORDER — SODIUM CHLORIDE 0.9 % IV SOLN
Freq: Once | INTRAVENOUS | Status: AC
  Administered 2017-07-24: 10:00:00 via INTRAVENOUS

## 2017-07-24 MED ORDER — HEPARIN SOD (PORK) LOCK FLUSH 100 UNIT/ML IV SOLN
500.0000 [IU] | Freq: Once | INTRAVENOUS | Status: AC | PRN
  Administered 2017-07-24: 500 [IU]
  Filled 2017-07-24: qty 5

## 2017-07-24 MED ORDER — DEXAMETHASONE SODIUM PHOSPHATE 10 MG/ML IJ SOLN
10.0000 mg | Freq: Once | INTRAMUSCULAR | Status: AC
  Administered 2017-07-24: 10 mg via INTRAVENOUS

## 2017-07-24 MED ORDER — DIPHENHYDRAMINE HCL 50 MG/ML IJ SOLN
25.0000 mg | Freq: Once | INTRAMUSCULAR | Status: AC
  Administered 2017-07-24: 25 mg via INTRAVENOUS

## 2017-07-24 MED ORDER — DEXAMETHASONE SODIUM PHOSPHATE 10 MG/ML IJ SOLN
INTRAMUSCULAR | Status: AC
Start: 2017-07-24 — End: ?
  Filled 2017-07-24: qty 1

## 2017-07-24 MED ORDER — SODIUM CHLORIDE 0.9% FLUSH
10.0000 mL | INTRAVENOUS | Status: DC | PRN
  Administered 2017-07-24: 10 mL
  Filled 2017-07-24: qty 10

## 2017-07-24 MED ORDER — SODIUM CHLORIDE 0.9 % IV SOLN
60.0000 mg/m2 | Freq: Once | INTRAVENOUS | Status: AC
  Administered 2017-07-24: 114 mg via INTRAVENOUS
  Filled 2017-07-24: qty 19

## 2017-07-24 MED ORDER — FAMOTIDINE IN NACL 20-0.9 MG/50ML-% IV SOLN
20.0000 mg | Freq: Once | INTRAVENOUS | Status: AC
  Administered 2017-07-24: 20 mg via INTRAVENOUS

## 2017-07-24 MED ORDER — ACETAMINOPHEN 325 MG PO TABS
650.0000 mg | ORAL_TABLET | Freq: Once | ORAL | Status: AC
  Administered 2017-07-24: 650 mg via ORAL

## 2017-07-24 MED ORDER — TRASTUZUMAB CHEMO 150 MG IV SOLR
2.0000 mg/kg | Freq: Once | INTRAVENOUS | Status: AC
  Administered 2017-07-24: 168 mg via INTRAVENOUS
  Filled 2017-07-24: qty 8

## 2017-07-24 MED ORDER — ACETAMINOPHEN 325 MG PO TABS
ORAL_TABLET | ORAL | Status: AC
  Filled 2017-07-24: qty 2

## 2017-07-24 MED ORDER — FAMOTIDINE IN NACL 20-0.9 MG/50ML-% IV SOLN
INTRAVENOUS | Status: AC
  Filled 2017-07-24: qty 50

## 2017-07-24 NOTE — Patient Instructions (Signed)
[facility] Discharge Instructions for Patients Receiving Chemotherapy  Today you received the following chemotherapy agents: Trastuzumab (Herceptin) and Paclitaxel (Taxol)  To help prevent nausea and vomiting after your treatment, we encourage you to take your nausea medication  as prescribed.    If you develop nausea and vomiting that is not controlled by your nausea medication, call the clinic.   BELOW ARE SYMPTOMS THAT SHOULD BE REPORTED IMMEDIATELY:  *FEVER GREATER THAN 100.5 F  *CHILLS WITH OR WITHOUT FEVER  NAUSEA AND VOMITING THAT IS NOT CONTROLLED WITH YOUR NAUSEA MEDICATION  *UNUSUAL SHORTNESS OF BREATH  *UNUSUAL BRUISING OR BLEEDING  TENDERNESS IN MOUTH AND THROAT WITH OR WITHOUT PRESENCE OF ULCERS  *URINARY PROBLEMS  *BOWEL PROBLEMS  UNUSUAL RASH Items with * indicate a potential emergency and should be followed up as soon as possible.  Feel free to call the clinic should you have any questions or concerns. The clinic phone number is [phone].  Please show the CHEMO ALERT CARD at check-in to the Emergency Department and triage nurse.   

## 2017-07-24 NOTE — Progress Notes (Signed)
Per Dr. Lindi Adie okay for treatment with ANC 1.1.  Will do a dose reduction.

## 2017-07-24 NOTE — Progress Notes (Signed)
Patient Care Team: Lucianne Lei, MD as PCP - General (Family Medicine) Donato Heinz, MD (Nephrology) Lorretta Harp, MD as Consulting Physician (Cardiology) Pyrtle, Lajuan Lines, MD as Consulting Physician (Gastroenterology)  DIAGNOSIS:  Encounter Diagnosis  Name Primary?  . Malignant neoplasm of upper-outer quadrant of right breast in female, estrogen receptor negative ([place])     SUMMARY OF ONCOLOGIC HISTORY:   Malignant neoplasm of upper-outer quadrant of right breast in female, estrogen receptor negative ([place])   06/19/2017 Initial Diagnosis    Right breast skin thickening and heaviness: Screening mammogram detected 2 cm of microcalcifications in the right breast in the posterior third UOQ.?  Skin involvement, stereotactic biopsy revealed IDC grade 2-3 with high-grade DCIS, lymphovascular invasion present, ER 0%, PR 0%, HER-2 positive ratio 8.69, gene copy #11.3 T1CN0 stage Ia      07/02/2017 Cancer Staging    Staging form: Breast, AJCC 8th Edition - Clinical: Stage IA (cT1c, cN0, cM0, G3, ER-, PR-, HER2+) - Signed by Nicholas Lose, MD on 07/02/2017      07/17/2017 -  Chemotherapy    Taxol Herceptin weekly x12 followed by Herceptin maintenance x1 year       CHIEF COMPLIANT: Cycle 2 Taxol Herceptin  INTERVAL HISTORY: Karen [name] is a 62 year old with above-mentioned history of right breast cancer who is currently on neoadjuvant chemotherapy with Taxol Herceptin.  Today cycle 2 of treatment.  Overall she tolerated cycle 1 extremely well. She denies any nausea or vomiting.  Denies any fevers or chills  REVIEW OF SYSTEMS:   Constitutional: Denies fevers, chills or abnormal weight loss Eyes: Denies blurriness of vision Ears, nose, mouth, throat, and face: Denies mucositis or sore throat Respiratory: Denies cough, dyspnea or wheezes Cardiovascular: Denies palpitation, chest discomfort Gastrointestinal:  Denies nausea, heartburn or change in bowel habits Skin: Denies abnormal  skin rashes Lymphatics: Denies new lymphadenopathy or easy bruising Neurological:Denies numbness, tingling or new weaknesses Behavioral/Psych: Mood is stable, no new changes  Extremities: No lower extremity edema  All other systems were reviewed with the patient and are negative.  I have reviewed the past medical history, past surgical history, social history and family history with the patient and they are unchanged from previous note.  ALLERGIES:  has No Known Allergies.  MEDICATIONS:  Current Outpatient Medications  Medication Sig Dispense Refill  . acetaminophen (TYLENOL) 500 MG tablet Take 1,000 mg by mouth daily as needed for mild pain, moderate pain or headache.     Marland Kitchen amoxicillin (AMOXIL) 500 MG capsule Take 2,000 mg by mouth See admin instructions. Take 4 capsules (2000 mg) by mouth one hour prior to dental procedures    . aspirin EC 81 MG tablet Take 81 mg by mouth daily.    . Darbepoetin Alfa-Albumin (ARANESP IJ) Inject as directed every 30 (thirty) days.    Marland Kitchen diltiazem (CARDIZEM) 30 MG tablet Take 1/2 tablet the morning of dialysis (Patient taking differently: Take 15 mg by mouth See admin instructions. Take 1/2 tablet the morning of dialysis) 45 tablet 1  . lidocaine-prilocaine (EMLA) cream Apply 1 application topically See admin instructions. Apply topically Monday, Wednesday and Friday before dialysis  6  . multivitamin (RENA-VIT) TABS tablet Take 1 tablet by mouth daily.    . ondansetron (ZOFRAN) 8 MG tablet Take 1 tablet (8 mg total) by mouth 2 (two) times daily as needed (Nausea or vomiting). 30 tablet 1  . pantoprazole (PROTONIX) 40 MG tablet TAKE 1 TABLET (40 MG TOTAL) BY MOUTH DAILY. 90 tablet  1  . prochlorperazine (COMPAZINE) 10 MG tablet Take 1 tablet (10 mg total) by mouth every 6 (six) hours as needed (Nausea or vomiting). 30 tablet 1  . sevelamer carbonate (RENVELA) 800 MG tablet Take 800-2,400 mg by mouth See admin instructions. 2,469m three times daily and 8050m twice daily with snacks    . simvastatin (ZOCOR) 20 MG tablet Take 1 tablet (20 mg total) by mouth at bedtime. 30 tablet 11   No current facility-administered medications for this visit.     PHYSICAL EXAMINATION: ECOG PERFORMANCE STATUS: 1 - Symptomatic but completely ambulatory  Vitals:   07/24/17 0835  BP: 130/79  Pulse: 72  Resp: 17  Temp: 98.6 F (37 C)  SpO2: 100%   Filed Weights   07/24/17 0835  Weight: 190 lb 8 oz (86.4 kg)    GENERAL:alert, no distress and comfortable SKIN: skin color, texture, turgor are normal, no rashes or significant lesions EYES: normal, Conjunctiva are pink and non-injected, sclera clear OROPHARYNX:no exudate, no erythema and lips, buccal mucosa, and tongue normal  NECK: supple, thyroid normal size, non-tender, without nodularity LYMPH:  no palpable lymphadenopathy in the cervical, axillary or inguinal LUNGS: clear to auscultation and percussion with normal breathing effort HEART: regular rate & rhythm and no murmurs and no lower extremity edema ABDOMEN:abdomen soft, non-tender and normal bowel sounds MUSCULOSKELETAL:no cyanosis of digits and no clubbing  NEURO: alert & oriented x 3 with fluent speech, no focal motor/sensory deficits EXTREMITIES: No lower extremity edema  LABORATORY DATA:  I have reviewed the data as listed CMP Latest Ref Rng & Units 07/17/2017 07/05/2017 01/12/2017  Glucose 70 - 99 mg/dL 77 90 84  BUN 8 - 23 mg/dL 29(H) 24(H) 12  Creatinine 0.44 - 1.00 mg/dL 6.42(HH) 5.51(H) 4.27(H)  Sodium 135 - 145 mmol/L 143 144 138  Potassium 3.5 - 5.1 mmol/L 4.1 4.0 3.9  Chloride 98 - 111 mmol/L 98 100(L) 92(L)  CO2 22 - 32 mmol/L 34(H) 33(H) 30  Calcium 8.9 - 10.3 mg/dL 10.1 9.6 9.4  Total Protein 6.5 - 8.1 g/dL 6.9 - -  Total Bilirubin 0.3 - 1.2 mg/dL 0.4 - -  Alkaline Phos 38 - 126 U/L 139(H) - -  AST 15 - 41 U/L 18 - -  ALT 0 - 44 U/L <6 - -    Lab Results  Component Value Date   WBC 2.0 (L) 07/24/2017   HGB 9.7 (L)  07/24/2017   HCT 29.3 (L) 07/24/2017   MCV 97.3 07/24/2017   PLT 200 07/24/2017   NEUTROABS 1.1 (L) 07/24/2017    ASSESSMENT & PLAN:  Malignant neoplasm of upper-outer quadrant of right breast in female, estrogen receptor negative ([place]6/04/2017: Right breast skin thickening and heaviness: Screening mammogram detected 2 cm of microcalcifications in the right breast in the posterior third UOQ.?  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  2. neoadjuvant chemotherapy with Taxol Herceptin weekly x12 followed by Herceptin maintenance for 1 year 3.  Followed by surgery with either lumpectomy or mastectomy depending on the MRI findings and treatment response 4.  Followed by adjuvant radiation if she undergoes breast conserving surgery 5.  Followed by adjuvant antiestrogen therapy  -------------------------------------------------------------------------------- Current treatment: Cycle 2 day 1 Taxol Herceptin Labs have been reviewed Chemo Toxicities: 1.  ANC 1.1: I decreased the dosage of Taxol to 60 mg/m.  She will come back next week to see me.  If her [place] still remains low then we may have to consider adding Granix prior to each treatment.   2. mild fatigue 3.  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  No orders of the defined types were placed in this encounter.  The patient has a good understanding of the overall plan. she agrees with it. she will call with any problems that may develop before the next visit here.   Harriette Ohara, MD 07/24/17

## 2017-07-24 NOTE — Progress Notes (Signed)
Lab called with critical creatinine level of 6.02.  Dr. Lindi Adie aware, patient on dialysis.

## 2017-07-24 NOTE — Assessment & Plan Note (Signed)
06/19/2017: Right breast skin thickening and heaviness: Screening mammogram detected 2 cm of microcalcifications in the right breast in the posterior third UOQ.?  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  2. neoadjuvant chemotherapy with Taxol Herceptin weekly x12 followed by Herceptin maintenance for 1 year 3.  Followed by surgery with either lumpectomy or mastectomy depending on the MRI findings and treatment response 4.  Followed by adjuvant radiation if she undergoes breast conserving surgery 5.  Followed by adjuvant antiestrogen therapy  -------------------------------------------------------------------------------- Current treatment: Cycle 2 day 1 Taxol Herceptin Labs have been reviewed Chemo Toxicities:   Return to clinic weekly for chemo and in 2 weeks for follow up with me.

## 2017-07-24 NOTE — Progress Notes (Deleted)
Per Dr. Lindi Adie okay to treat with ANC of 1.1.  Dr. Lindi Adie will do dose reduction.

## 2017-07-25 ENCOUNTER — Telehealth: Payer: Self-pay | Admitting: Hematology and Oncology

## 2017-07-25 NOTE — Telephone Encounter (Signed)
Patient scheduled per 7/9 los.

## 2017-07-31 ENCOUNTER — Inpatient Hospital Stay: Payer: Medicare Other

## 2017-07-31 ENCOUNTER — Inpatient Hospital Stay (HOSPITAL_BASED_OUTPATIENT_CLINIC_OR_DEPARTMENT_OTHER): Payer: Medicare Other | Admitting: Hematology and Oncology

## 2017-07-31 VITALS — BP 106/61 | HR 74 | Temp 98.8°F | Resp 18 | Ht 61.0 in | Wt 189.2 lb

## 2017-07-31 DIAGNOSIS — C50411 Malignant neoplasm of upper-outer quadrant of right female breast: Secondary | ICD-10-CM

## 2017-07-31 DIAGNOSIS — D6481 Anemia due to antineoplastic chemotherapy: Secondary | ICD-10-CM

## 2017-07-31 DIAGNOSIS — N189 Chronic kidney disease, unspecified: Secondary | ICD-10-CM | POA: Diagnosis not present

## 2017-07-31 DIAGNOSIS — D631 Anemia in chronic kidney disease: Secondary | ICD-10-CM | POA: Diagnosis not present

## 2017-07-31 DIAGNOSIS — Z171 Estrogen receptor negative status [ER-]: Principal | ICD-10-CM

## 2017-07-31 DIAGNOSIS — Z5111 Encounter for antineoplastic chemotherapy: Secondary | ICD-10-CM | POA: Diagnosis not present

## 2017-07-31 DIAGNOSIS — Z95828 Presence of other vascular implants and grafts: Secondary | ICD-10-CM

## 2017-07-31 DIAGNOSIS — Z79899 Other long term (current) drug therapy: Secondary | ICD-10-CM

## 2017-07-31 LAB — CBC WITH DIFFERENTIAL (CANCER CENTER ONLY)
BASOS PCT: 2 %
Basophils Absolute: 0 10*3/uL (ref 0.0–0.1)
EOS ABS: 0.1 10*3/uL (ref 0.0–0.5)
EOS PCT: 6 %
HCT: 27 % — ABNORMAL LOW (ref 34.8–46.6)
HEMOGLOBIN: 8.9 g/dL — AB (ref 11.6–15.9)
Lymphocytes Relative: 41 %
Lymphs Abs: 0.7 10*3/uL — ABNORMAL LOW (ref 0.9–3.3)
MCH: 32.1 pg (ref 25.1–34.0)
MCHC: 33.1 g/dL (ref 31.5–36.0)
MCV: 97.1 fL (ref 79.5–101.0)
MONO ABS: 0.2 10*3/uL (ref 0.1–0.9)
Monocytes Relative: 11 %
Neutro Abs: 0.7 10*3/uL — ABNORMAL LOW (ref 1.5–6.5)
Neutrophils Relative %: 40 %
Platelet Count: 242 10*3/uL (ref 145–400)
RBC: 2.78 MIL/uL — AB (ref 3.70–5.45)
RDW: 16.7 % — ABNORMAL HIGH (ref 11.2–14.5)
WBC Count: 1.8 10*3/uL — ABNORMAL LOW (ref 3.9–10.3)

## 2017-07-31 LAB — CMP (CANCER CENTER ONLY)
ALK PHOS: 114 U/L (ref 38–126)
ALT: 14 U/L (ref 0–44)
ANION GAP: 9 (ref 5–15)
AST: 19 U/L (ref 15–41)
Albumin: 3.6 g/dL (ref 3.5–5.0)
BILIRUBIN TOTAL: 0.3 mg/dL (ref 0.3–1.2)
BUN: 32 mg/dL — ABNORMAL HIGH (ref 8–23)
CALCIUM: 9 mg/dL (ref 8.9–10.3)
CO2: 34 mmol/L — AB (ref 22–32)
CREATININE: 6.68 mg/dL — AB (ref 0.44–1.00)
Chloride: 99 mmol/L (ref 98–111)
GFR, Est AFR Am: 7 mL/min — ABNORMAL LOW (ref >60)
GFR, Estimated: 6 mL/min — ABNORMAL LOW (ref >60)
GLUCOSE: 97 mg/dL (ref 70–99)
Potassium: 4.7 mmol/L (ref 3.5–5.1)
SODIUM: 142 mmol/L (ref 135–145)
TOTAL PROTEIN: 6.7 g/dL (ref 6.5–8.1)

## 2017-07-31 MED ORDER — TBO-FILGRASTIM 480 MCG/0.8ML ~~LOC~~ SOSY
PREFILLED_SYRINGE | SUBCUTANEOUS | Status: AC
  Filled 2017-07-31: qty 0.8

## 2017-07-31 MED ORDER — HEPARIN SOD (PORK) LOCK FLUSH 100 UNIT/ML IV SOLN
500.0000 [IU] | Freq: Once | INTRAVENOUS | Status: AC | PRN
  Administered 2017-07-31: 500 [IU]
  Filled 2017-07-31: qty 5

## 2017-07-31 MED ORDER — TBO-FILGRASTIM 480 MCG/0.8ML ~~LOC~~ SOSY
480.0000 ug | PREFILLED_SYRINGE | SUBCUTANEOUS | Status: DC
  Administered 2017-07-31: 480 ug via SUBCUTANEOUS

## 2017-07-31 MED ORDER — SODIUM CHLORIDE 0.9% FLUSH
10.0000 mL | INTRAVENOUS | Status: DC | PRN
  Administered 2017-07-31: 10 mL
  Filled 2017-07-31: qty 10

## 2017-07-31 NOTE — Assessment & Plan Note (Addendum)
06/19/2017: Right breast skin thickening and heaviness: Screening mammogram detected 2 cm of microcalcifications in the right breast in the posterior third UOQ.?  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  2. neoadjuvant chemotherapy with Taxol Herceptin weekly x12 followed by Herceptin maintenance for 1 year 3.  Followed by surgery with either lumpectomy or mastectomy depending on the MRI findings and treatment response 4.  Followed by adjuvant radiation if she undergoes breast conserving surgery 5.  Followed by adjuvant antiestrogen therapy  -------------------------------------------------------------------------------- Current treatment: Cycle 4 day 1 Taxol Herceptin Labs have been reviewed Chemo Toxicities: 1.  ANC 1.1: I decreased the dosage of Taxol to 60 mg/m. She will come back next week to see me.  If her [place] still remains low then we may have to consider adding Granix prior to each treatment.   2. mild fatigue 3.  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

## 2017-07-31 NOTE — Patient Instructions (Signed)
Tbo-Filgrastim injection What is this medicine? TBO-FILGRASTIM (T B O fil GRA stim) is a granulocyte colony-stimulating factor that stimulates the growth of neutrophils, a type of white blood cell important in the body's fight against infection. It is used to reduce the incidence of fever and infection in patients with certain types of cancer who are receiving chemotherapy that affects the bone marrow. This medicine may be used for other purposes; ask your health care provider or pharmacist if you have questions. COMMON BRAND NAME(S): Granix What should I tell my health care provider before I take this medicine? They need to know if you have any of these conditions: -bone scan or tests planned -kidney disease -sickle cell anemia -an unusual or allergic reaction to tbo-filgrastim, filgrastim, pegfilgrastim, other medicines, foods, dyes, or preservatives -pregnant or trying to get pregnant -breast-feeding How should I use this medicine? This medicine is for injection under the skin. If you get this medicine at home, you will be taught how to prepare and give this medicine. Refer to the Instructions for Use that come with your medication packaging. Use exactly as directed. Take your medicine at regular intervals. Do not take your medicine more often than directed. It is important that you put your used needles and syringes in a special sharps container. Do not put them in a trash can. If you do not have a sharps container, call your pharmacist or healthcare provider to get one. Talk to your pediatrician regarding the use of this medicine in children. Special care may be needed. Overdosage: If you think you have taken too much of this medicine contact a poison control center or emergency room at once. NOTE: This medicine is only for you. Do not share this medicine with others. What if I miss a dose? It is important not to miss your dose. Call your doctor or health care professional if you miss a  dose. What may interact with this medicine? This medicine may interact with the following medications: -medicines that may cause a release of neutrophils, such as lithium This list may not describe all possible interactions. Give your health care provider a list of all the medicines, herbs, non-prescription drugs, or dietary supplements you use. Also tell them if you smoke, drink alcohol, or use illegal drugs. Some items may interact with your medicine. What should I watch for while using this medicine? You may need blood work done while you are taking this medicine. What side effects may I notice from receiving this medicine? Side effects that you should report to your doctor or health care professional as soon as possible: -allergic reactions like skin rash, itching or hives, swelling of the face, lips, or tongue -blood in the urine -dark urine -dizziness -fast heartbeat -feeling faint -shortness of breath or breathing problems -signs and symptoms of infection like fever or chills; cough; or sore throat -signs and symptoms of kidney injury like trouble passing urine or change in the amount of urine -stomach or side pain, or pain at the shoulder -sweating -swelling of the legs, ankles, or abdomen -tiredness Side effects that usually do not require medical attention (report to your doctor or health care professional if they continue or are bothersome): -bone pain -headache -muscle pain -vomiting This list may not describe all possible side effects. Call your doctor for medical advice about side effects. You may report side effects to FDA at 1-800-FDA-1088. Where should I keep my medicine? Keep out of the reach of children. Store in a refrigerator between   2 and 8 degrees C (36 and 46 degrees F). Keep in carton to protect from light. Throw away this medicine if it is left out of the refrigerator for more than 5 consecutive days. Throw away any unused medicine after the expiration  date. NOTE: This sheet is a summary. It may not cover all possible information. If you have questions about this medicine, talk to your doctor, pharmacist, or health care provider.  2018 Elsevier/Gold Standard (2015-02-22 19:07:04)  

## 2017-07-31 NOTE — Progress Notes (Signed)
Patient Care Team: Lucianne Lei, MD as PCP - General (Family Medicine) Donato Heinz, MD (Nephrology) Lorretta Harp, MD as Consulting Physician (Cardiology) Pyrtle, Lajuan Lines, MD as Consulting Physician (Gastroenterology)  DIAGNOSIS:  Encounter Diagnoses  Name Primary?  . Malignant neoplasm of upper-outer quadrant of right breast in female, estrogen receptor negative ([place]) Yes  . Port-A-Cath in place     SUMMARY OF ONCOLOGIC HISTORY:   Malignant neoplasm of upper-outer quadrant of right breast in female, estrogen receptor negative ([place])   06/19/2017 Initial Diagnosis    Right breast skin thickening and heaviness: Screening mammogram detected 2 cm of microcalcifications in the right breast in the posterior third UOQ.?  Skin involvement, stereotactic biopsy revealed IDC grade 2-3 with high-grade DCIS, lymphovascular invasion present, ER 0%, PR 0%, HER-2 positive ratio 8.69, gene copy #11.3 T1CN0 stage Ia      07/02/2017 Cancer Staging    Staging form: Breast, AJCC 8th Edition - Clinical: Stage IA (cT1c, cN0, cM0, G3, ER-, PR-, HER2+) - Signed by Nicholas Lose, MD on 07/02/2017      07/17/2017 -  Chemotherapy    Taxol Herceptin weekly x12 followed by Herceptin maintenance x1 year       CHIEF COMPLIANT: Cycle 3 Taxol Herceptin  INTERVAL HISTORY: Karen [name] is a 62 year old with above-mentioned history of right breast cancer currently on neoadjuvant chemotherapy with Taxol Herceptin.  Today cycle 3 of treatment.  Today her [place] is only 0.7 and therefore we are canceling today's treatment.  She is losing some hair.  REVIEW OF SYSTEMS:   Constitutional: Denies fevers, chills or abnormal weight loss Eyes: Denies blurriness of vision Ears, nose, mouth, throat, and face: Denies mucositis or sore throat Respiratory: Denies cough, dyspnea or wheezes Cardiovascular: Denies palpitation, chest discomfort Gastrointestinal:  Denies nausea, heartburn or change in bowel habits Skin:  Denies abnormal skin rashes Lymphatics: Denies new lymphadenopathy or easy bruising Neurological:Denies numbness, tingling or new weaknesses Behavioral/Psych: Mood is stable, no new changes  Extremities: No lower extremity edema   All other systems were reviewed with the patient and are negative.  I have reviewed the past medical history, past surgical history, social history and family history with the patient and they are unchanged from previous note.  ALLERGIES:  has No Known Allergies.  MEDICATIONS:  Current Outpatient Medications  Medication Sig Dispense Refill  . acetaminophen (TYLENOL) 500 MG tablet Take 1,000 mg by mouth daily as needed for mild pain, moderate pain or headache.     Marland Kitchen amoxicillin (AMOXIL) 500 MG capsule Take 2,000 mg by mouth See admin instructions. Take 4 capsules (2000 mg) by mouth one hour prior to dental procedures    . aspirin EC 81 MG tablet Take 81 mg by mouth daily.    . Darbepoetin Alfa-Albumin (ARANESP IJ) Inject as directed every 30 (thirty) days.    Marland Kitchen diltiazem (CARDIZEM) 30 MG tablet Take 1/2 tablet the morning of dialysis (Patient taking differently: Take 15 mg by mouth See admin instructions. Take 1/2 tablet the morning of dialysis) 45 tablet 1  . lidocaine-prilocaine (EMLA) cream Apply 1 application topically See admin instructions. Apply topically Monday, Wednesday and Friday before dialysis  6  . multivitamin (RENA-VIT) TABS tablet Take 1 tablet by mouth daily.    . ondansetron (ZOFRAN) 8 MG tablet Take 1 tablet (8 mg total) by mouth 2 (two) times daily as needed (Nausea or vomiting). 30 tablet 1  . pantoprazole (PROTONIX) 40 MG tablet TAKE 1 TABLET (40 MG TOTAL)  BY MOUTH DAILY. 90 tablet 1  . prochlorperazine (COMPAZINE) 10 MG tablet Take 1 tablet (10 mg total) by mouth every 6 (six) hours as needed (Nausea or vomiting). 30 tablet 1  . sevelamer carbonate (RENVELA) 800 MG tablet Take 800-2,400 mg by mouth See admin instructions. 2,'400mg'$  three times  daily and '800mg'$  twice daily with snacks    . simvastatin (ZOCOR) 20 MG tablet Take 1 tablet (20 mg total) by mouth at bedtime. 30 tablet 11   Current Facility-Administered Medications  Medication Dose Route Frequency Provider Last Rate Last Dose  . sodium chloride flush (NS) 0.9 % injection 10 mL  10 mL Intracatheter PRN Nicholas Lose, MD   10 mL at 07/31/17 1233  . Tbo-Filgrastim (GRANIX) injection 480 mcg  480 mcg Subcutaneous Weekly Nicholas Lose, MD   480 mcg at 07/31/17 1232    PHYSICAL EXAMINATION: ECOG PERFORMANCE STATUS: 2 - Symptomatic, <50% confined to bed  Vitals:   07/31/17 1211  BP: 106/61  Pulse: 74  Resp: 18  Temp: 98.8 F (37.1 C)  SpO2: 100%   Filed Weights   07/31/17 1211  Weight: 189 lb 3.2 oz (85.8 kg)    GENERAL:alert, no distress and comfortable SKIN: skin color, texture, turgor are normal, no rashes or significant lesions EYES: normal, Conjunctiva are pink and non-injected, sclera clear OROPHARYNX:no exudate, no erythema and lips, buccal mucosa, and tongue normal  NECK: supple, thyroid normal size, non-tender, without nodularity LYMPH:  no palpable lymphadenopathy in the cervical, axillary or inguinal LUNGS: clear to auscultation and percussion with normal breathing effort HEART: regular rate & rhythm and no murmurs and no lower extremity edema ABDOMEN:abdomen soft, non-tender and normal bowel sounds MUSCULOSKELETAL:no cyanosis of digits and no clubbing  NEURO: alert & oriented x 3 with fluent speech, no focal motor/sensory deficits EXTREMITIES: No lower extremity edema   LABORATORY DATA:  I have reviewed the data as listed CMP Latest Ref Rng & Units 07/31/2017 07/24/2017 07/17/2017  Glucose 70 - 99 mg/dL 97 91 77  BUN 8 - 23 mg/dL 32(H) 25(H) 29(H)  Creatinine 0.44 - 1.00 mg/dL 6.68(HH) 6.02(HH) 6.42(HH)  Sodium 135 - 145 mmol/L 142 142 143  Potassium 3.5 - 5.1 mmol/L 4.7 4.5 4.1  Chloride 98 - 111 mmol/L 99 98 98  CO2 22 - 32 mmol/L 34(H) 34(H)  34(H)  Calcium 8.9 - 10.3 mg/dL 9.0 9.8 10.1  Total Protein 6.5 - 8.1 g/dL 6.7 7.1 6.9  Total Bilirubin 0.3 - 1.2 mg/dL 0.3 0.4 0.4  Alkaline Phos 38 - 126 U/L 114 114 139(H)  AST 15 - 41 U/L '19 22 18  '$ ALT 0 - 44 U/L 14 17 <6    Lab Results  Component Value Date   WBC 1.8 (L) 07/31/2017   HGB 8.9 (L) 07/31/2017   HCT 27.0 (L) 07/31/2017   MCV 97.1 07/31/2017   PLT 242 07/31/2017   NEUTROABS 0.7 (L) 07/31/2017    ASSESSMENT & PLAN:  Malignant neoplasm of upper-outer quadrant of right breast in female, estrogen receptor negative ([place]) 06/19/2017: Right breast skin thickening and heaviness: Screening mammogram detected 2 cm of microcalcifications in the right breast in the posterior third UOQ.?  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  2. neoadjuvant chemotherapy with Taxol Herceptin weekly x12 followed by Herceptin maintenance for 1 year 3.  Followed by surgery with  either lumpectomy or mastectomy depending on the MRI findings and treatment response 4.  Followed by adjuvant radiation if she undergoes breast conserving surgery 5.  Followed by adjuvant antiestrogen therapy  -------------------------------------------------------------------------------- Current treatment: Cycle 3 day 1 Taxol Herceptin We are holding today's treatment Labs have been reviewed Chemo Toxicities: 1.  ANC 0.7 because of this we will hold her treatment today.  She will get Granix 480 mcg today.  We will plan on giving her Granix and every Saturday prior to each of her subsequent chemotherapy treatments.  She will come back in 1 week for her treatment with Taxol Herceptin.: 2. mild fatigue 3.  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  The patient has a good understanding of the overall plan. she agrees with it. she will call with any problems that may develop before the next visit here.   Harriette Ohara, MD 07/31/17

## 2017-08-07 ENCOUNTER — Inpatient Hospital Stay: Payer: Medicare Other

## 2017-08-07 ENCOUNTER — Inpatient Hospital Stay (HOSPITAL_BASED_OUTPATIENT_CLINIC_OR_DEPARTMENT_OTHER): Payer: Medicare Other | Admitting: Nurse Practitioner

## 2017-08-07 ENCOUNTER — Encounter: Payer: Self-pay | Admitting: Nurse Practitioner

## 2017-08-07 ENCOUNTER — Telehealth: Payer: Self-pay

## 2017-08-07 ENCOUNTER — Telehealth: Payer: Self-pay | Admitting: Emergency Medicine

## 2017-08-07 VITALS — BP 111/62 | HR 82 | Temp 97.9°F | Resp 18 | Ht 61.0 in | Wt 190.9 lb

## 2017-08-07 DIAGNOSIS — Z171 Estrogen receptor negative status [ER-]: Secondary | ICD-10-CM

## 2017-08-07 DIAGNOSIS — Z992 Dependence on renal dialysis: Secondary | ICD-10-CM

## 2017-08-07 DIAGNOSIS — Z95828 Presence of other vascular implants and grafts: Secondary | ICD-10-CM

## 2017-08-07 DIAGNOSIS — C50411 Malignant neoplasm of upper-outer quadrant of right female breast: Secondary | ICD-10-CM

## 2017-08-07 DIAGNOSIS — D631 Anemia in chronic kidney disease: Secondary | ICD-10-CM | POA: Diagnosis not present

## 2017-08-07 DIAGNOSIS — I129 Hypertensive chronic kidney disease with stage 1 through stage 4 chronic kidney disease, or unspecified chronic kidney disease: Secondary | ICD-10-CM

## 2017-08-07 DIAGNOSIS — D6481 Anemia due to antineoplastic chemotherapy: Secondary | ICD-10-CM

## 2017-08-07 DIAGNOSIS — N186 End stage renal disease: Secondary | ICD-10-CM

## 2017-08-07 DIAGNOSIS — Z79899 Other long term (current) drug therapy: Secondary | ICD-10-CM

## 2017-08-07 DIAGNOSIS — Z5111 Encounter for antineoplastic chemotherapy: Secondary | ICD-10-CM | POA: Diagnosis not present

## 2017-08-07 LAB — CMP (CANCER CENTER ONLY)
ALK PHOS: 165 U/L — AB (ref 38–126)
ALT: 11 U/L (ref 0–44)
AST: 16 U/L (ref 15–41)
Albumin: 3.6 g/dL (ref 3.5–5.0)
Anion gap: 13 (ref 5–15)
BILIRUBIN TOTAL: 0.4 mg/dL (ref 0.3–1.2)
BUN: 30 mg/dL — ABNORMAL HIGH (ref 8–23)
CALCIUM: 9.4 mg/dL (ref 8.9–10.3)
CO2: 30 mmol/L (ref 22–32)
CREATININE: 6.44 mg/dL — AB (ref 0.44–1.00)
Chloride: 101 mmol/L (ref 98–111)
GFR, EST AFRICAN AMERICAN: 7 mL/min — AB (ref >60)
GFR, EST NON AFRICAN AMERICAN: 6 mL/min — AB (ref >60)
Glucose, Bld: 102 mg/dL — ABNORMAL HIGH (ref 70–99)
Potassium: 4.3 mmol/L (ref 3.5–5.1)
Sodium: 144 mmol/L (ref 135–145)
TOTAL PROTEIN: 7 g/dL (ref 6.5–8.1)

## 2017-08-07 LAB — CBC WITH DIFFERENTIAL (CANCER CENTER ONLY)
Basophils Absolute: 0 10*3/uL (ref 0.0–0.1)
Basophils Relative: 1 %
Eosinophils Absolute: 0.1 10*3/uL (ref 0.0–0.5)
Eosinophils Relative: 3 %
HEMATOCRIT: 29 % — AB (ref 34.8–46.6)
HEMOGLOBIN: 9.4 g/dL — AB (ref 11.6–15.9)
LYMPHS ABS: 0.8 10*3/uL — AB (ref 0.9–3.3)
Lymphocytes Relative: 24 %
MCH: 32 pg (ref 25.1–34.0)
MCHC: 32.6 g/dL (ref 31.5–36.0)
MCV: 98.3 fL (ref 79.5–101.0)
Monocytes Absolute: 0.7 10*3/uL (ref 0.1–0.9)
Monocytes Relative: 20 %
NEUTROS PCT: 52 %
Neutro Abs: 1.7 10*3/uL (ref 1.5–6.5)
Platelet Count: 208 10*3/uL (ref 145–400)
RBC: 2.94 MIL/uL — AB (ref 3.70–5.45)
RDW: 17.6 % — ABNORMAL HIGH (ref 11.2–14.5)
WBC: 3.3 10*3/uL — AB (ref 3.9–10.3)

## 2017-08-07 MED ORDER — ACETAMINOPHEN 325 MG PO TABS
650.0000 mg | ORAL_TABLET | Freq: Once | ORAL | Status: AC
  Administered 2017-08-07: 650 mg via ORAL

## 2017-08-07 MED ORDER — FAMOTIDINE IN NACL 20-0.9 MG/50ML-% IV SOLN
INTRAVENOUS | Status: AC
  Filled 2017-08-07: qty 50

## 2017-08-07 MED ORDER — DIPHENHYDRAMINE HCL 50 MG/ML IJ SOLN
INTRAMUSCULAR | Status: AC
  Filled 2017-08-07: qty 1

## 2017-08-07 MED ORDER — FAMOTIDINE IN NACL 20-0.9 MG/50ML-% IV SOLN
20.0000 mg | Freq: Once | INTRAVENOUS | Status: AC
  Administered 2017-08-07: 20 mg via INTRAVENOUS

## 2017-08-07 MED ORDER — DEXAMETHASONE SODIUM PHOSPHATE 10 MG/ML IJ SOLN
INTRAMUSCULAR | Status: AC
  Filled 2017-08-07: qty 1

## 2017-08-07 MED ORDER — SODIUM CHLORIDE 0.9 % IV SOLN
Freq: Once | INTRAVENOUS | Status: AC
  Administered 2017-08-07: 14:00:00 via INTRAVENOUS

## 2017-08-07 MED ORDER — SODIUM CHLORIDE 0.9% FLUSH
10.0000 mL | INTRAVENOUS | Status: DC | PRN
  Administered 2017-08-07: 10 mL
  Filled 2017-08-07: qty 10

## 2017-08-07 MED ORDER — TRASTUZUMAB CHEMO 150 MG IV SOLR
2.0000 mg/kg | Freq: Once | INTRAVENOUS | Status: AC
  Administered 2017-08-07: 168 mg via INTRAVENOUS
  Filled 2017-08-07: qty 8

## 2017-08-07 MED ORDER — ACETAMINOPHEN 325 MG PO TABS
ORAL_TABLET | ORAL | Status: AC
  Filled 2017-08-07: qty 2

## 2017-08-07 MED ORDER — HEPARIN SOD (PORK) LOCK FLUSH 100 UNIT/ML IV SOLN
500.0000 [IU] | Freq: Once | INTRAVENOUS | Status: AC | PRN
  Administered 2017-08-07: 500 [IU]
  Filled 2017-08-07: qty 5

## 2017-08-07 MED ORDER — DEXAMETHASONE SODIUM PHOSPHATE 10 MG/ML IJ SOLN
10.0000 mg | Freq: Once | INTRAMUSCULAR | Status: AC
  Administered 2017-08-07: 10 mg via INTRAVENOUS

## 2017-08-07 MED ORDER — DIPHENHYDRAMINE HCL 50 MG/ML IJ SOLN
25.0000 mg | Freq: Once | INTRAMUSCULAR | Status: AC
  Administered 2017-08-07: 25 mg via INTRAVENOUS

## 2017-08-07 MED ORDER — SODIUM CHLORIDE 0.9 % IV SOLN
60.0000 mg/m2 | Freq: Once | INTRAVENOUS | Status: AC
  Administered 2017-08-07: 114 mg via INTRAVENOUS
  Filled 2017-08-07: qty 19

## 2017-08-07 NOTE — Progress Notes (Signed)
[place]  [phone] [phone]  Clinic Follow up Note   Patient Care Team: Lucianne Lei, MD as PCP - General (Family Medicine) Donato Heinz, MD (Nephrology) Lorretta Harp, MD as Consulting Physician (Cardiology) Jerene Bears, MD as Consulting Physician (Gastroenterology) 08/07/2017  SUMMARY OF ONCOLOGIC HISTORY:   Malignant neoplasm of upper-outer quadrant of right breast in female, estrogen receptor negative ([place])   06/19/2017 Initial Diagnosis    Right breast skin thickening and heaviness: Screening mammogram detected 2 cm of microcalcifications in the right breast in the posterior third UOQ.?  Skin involvement, stereotactic biopsy revealed IDC grade 2-3 with high-grade DCIS, lymphovascular invasion present, ER 0%, PR 0%, HER-2 positive ratio 8.69, gene copy #11.3 T1CN0 stage Ia      07/02/2017 Cancer Staging    Staging form: Breast, AJCC 8th Edition - Clinical: Stage IA (cT1c, cN0, cM0, G3, ER-, PR-, HER2+) - Signed by Nicholas Lose, MD on 07/02/2017      07/17/2017 -  Chemotherapy    Taxol Herceptin weekly x12 followed by Herceptin maintenance x1 year     CURRENT THERAPY: neoadjuvant chemotherapy with Taxol Herceptin weekly x12 followed by Herceptin maintenance for 1 year  INTERVAL HISTORY: Ms. Frieden returns for follow up and next cycle herceptin/taxol. She developed mild neutropenia with cycle 2; cycle 3 was held for ANC 0.7 on 7/16 and she received granix. She had mild bone pain for a few hours after the injection, resolved with tylenol. She reports intermittent mild nausea that does not require anti-emetics. She continues to eat and drink normally. She had diarrhea one day last week, 3-4 episodes, and eventually resolved with imodium. Occasional dry cough is at baseline. Right breast is less swollen and less painful since starting treatment. Denies fever, chills, chest pain, dyspnea, or neuropathy.   REVIEW OF SYSTEMS:     Constitutional: Denies fevers, chills or abnormal weight loss Eyes: Denies blurriness of vision Ears, nose, mouth, throat, and face: Denies mucositis or sore throat Respiratory: Denies dyspnea or wheezes (+) periodic dry cough  Cardiovascular: Denies palpitation, chest discomfort or lower extremity swelling Gastrointestinal:  Denies vomiting, constipation, heartburn or change in bowel habits (+) mild intermittent nausea (+) periodic diarrhea 1 day last week, 3-4 episodes  Skin: Denies abnormal skin rashes Lymphatics: Denies new lymphadenopathy or easy bruising Neurological:Denies numbness, tingling or new weaknesses Behavioral/Psych: Mood is stable, no new changes  MSK: (+) mild bone pain with granix for few hours, resolved with tylenol  Breast: (+) right breast pain and swelling, improving  All other systems were reviewed with the patient and are negative.  MEDICAL HISTORY:  Past Medical History:  Diagnosis Date  . Anemia   . Arthritis    knees  . Chest pain Jan 2016   low risk Myoview   . Complication of anesthesia 2005   difficulty remembering for a while  . Constipation   . ESRD on dialysis [facility]) April 2016   MWF  . GERD (gastroesophageal reflux disease)   . Heart murmur Nov 2015   Aortic scleosis- no stenosis  . Hemodialysis patient ([place])   . Hypertension   . Shortness of breath dyspnea    with exertion    SURGICAL HISTORY: Past Surgical History:  Procedure Laterality Date  . ABDOMINAL HYSTERECTOMY  2005  . AV FISTULA PLACEMENT Left 12/09/2013   Procedure: INSERTION OF ARTERIOVENOUS (AV) GORE-TEX GRAFT ARM;  Surgeon: Elam Dutch, MD;  Location: [place];  Service: Vascular;  Laterality: Left;  .  BREAST BIOPSY    . BUNIONECTOMY Bilateral   . CHOLECYSTECTOMY  2005  . COLONOSCOPY    . IR GENERIC HISTORICAL  01/11/2016   IR US GUIDE VASC ACCESS RIGHT 01/11/2016 Corrie Mckusick, DO [facility]-INTER[facility]  . IR GENERIC HISTORICAL  01/11/2016   IR RADIOLOGY PERIPHERAL GUIDED IV  START 01/11/2016 Corrie Mckusick, DO [facility]  . IR IMAGING GUIDED PORT INSERTION  07/05/2017  . 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 KNEE ARTHROSCOPY Bilateral   . PERIPHERAL VASCULAR CATHETERIZATION N/A 08/31/2015   Procedure: A/V Shuntogram;  Surgeon: Serafina Mitchell, MD;  Location: [place] CV [facility];  Service: Cardiovascular;  Laterality: N/A;  . PERIPHERAL VASCULAR CATHETERIZATION Left 08/31/2015   Procedure: Peripheral Vascular Balloon Angioplasty;  Surgeon: Serafina Mitchell, MD;  Location: [place] CV LAB;  Service: Cardiovascular;  Laterality: Left;  arm fistula  . ROTATOR CUFF REPAIR Right 1997    I have reviewed the social history and family history with the patient and they are unchanged from previous note.  ALLERGIES:  has No Known Allergies.  MEDICATIONS:  Current Outpatient Medications  Medication Sig Dispense Refill  . acetaminophen (TYLENOL) 500 MG tablet Take 1,000 mg by mouth daily as needed for mild pain, moderate pain or headache.     Marland Kitchen amoxicillin (AMOXIL) 500 MG capsule Take 2,000 mg by mouth See admin instructions. Take 4 capsules (2000 mg) by mouth one hour prior to dental procedures    . aspirin EC 81 MG tablet Take 81 mg by mouth daily.    . Darbepoetin Alfa-Albumin (ARANESP IJ) Inject as directed every 30 (thirty) days.    Marland Kitchen diltiazem (CARDIZEM) 30 MG tablet Take 1/2 tablet the morning of dialysis (Patient taking differently: Take 15 mg by mouth See admin instructions. Take 1/2 tablet the morning of dialysis) 45 tablet 1  . lidocaine-prilocaine (EMLA) cream Apply 1 application topically See admin instructions. Apply topically Monday, Wednesday and Friday before dialysis  6  . multivitamin (RENA-VIT) TABS tablet Take 1 tablet by mouth daily.    . ondansetron (ZOFRAN) 8 MG tablet Take 1 tablet (8 mg total) by mouth 2 (two) times daily as needed (Nausea or vomiting). 30 tablet 1  . pantoprazole (PROTONIX) 40 MG tablet TAKE 1 TABLET (40 MG TOTAL) BY MOUTH  DAILY. 90 tablet 1  . prochlorperazine (COMPAZINE) 10 MG tablet Take 1 tablet (10 mg total) by mouth every 6 (six) hours as needed (Nausea or vomiting). 30 tablet 1  . sevelamer carbonate (RENVELA) 800 MG tablet Take 800-2,400 mg by mouth See admin instructions. 2,'400mg'$  three times daily and '800mg'$  twice daily with snacks    . simvastatin (ZOCOR) 20 MG tablet Take 1 tablet (20 mg total) by mouth at bedtime. 30 tablet 11   No current facility-administered medications for this visit.     PHYSICAL EXAMINATION: ECOG PERFORMANCE STATUS: 1 - Symptomatic but completely ambulatory  Vitals:   08/07/17 1301  BP: 111/62  Pulse: 82  Resp: 18  Temp: 97.9 F (36.6 C)  SpO2: 100%   Filed Weights   08/07/17 1301  Weight: 190 lb 14.4 oz (86.6 kg)    GENERAL:alert, no distress and comfortable SKIN: no rashes or significant lesions EYES: normal, Conjunctiva are pink and non-injected, sclera clear OROPHARYNX:no thrush or ulcers  LYMPH:  no palpable cervical or supraclavicular lymphadenopathy LUNGS: clear to auscultation with normal breathing effort HEART: regular rate & rhythm, with murmur, no lower extremity edema  ABDOMEN:abdomen soft, non-tender  and normal bowel sounds Musculoskeletal:no cyanosis of digits and no clubbing  NEURO: alert & oriented x 3 with fluent speech, no focal motor/sensory deficits BREAST: inspection shows them to be symmetrical, pendulous breasts without palpable mass in either breast or axilla  PAC without erythema, retained stitch at insertion site  LABORATORY DATA:  I have reviewed the data as listed CBC Latest Ref Rng & Units 08/07/2017 07/31/2017 07/24/2017  WBC 3.9 - 10.3 K/uL 3.3(L) 1.8(L) 2.0(L)  Hemoglobin 11.6 - 15.9 g/dL 9.4(L) 8.9(L) 9.7(L)  Hematocrit 34.8 - 46.6 % 29.0(L) 27.0(L) 29.3(L)  Platelets 145 - 400 K/uL 208 242 200     CMP Latest Ref Rng & Units 08/07/2017 07/31/2017 07/24/2017  Glucose 70 - 99 mg/dL 102(H) 97 91  BUN 8 - 23 mg/dL 30(H) 32(H) 25(H)   Creatinine 0.44 - 1.00 mg/dL 6.44(HH) 6.68(HH) 6.02(HH)  Sodium 135 - 145 mmol/L 144 142 142  Potassium 3.5 - 5.1 mmol/L 4.3 4.7 4.5  Chloride 98 - 111 mmol/L 101 99 98  CO2 22 - 32 mmol/L 30 34(H) 34(H)  Calcium 8.9 - 10.3 mg/dL 9.4 9.0 9.8  Total Protein 6.5 - 8.1 g/dL 7.0 6.7 7.1  Total Bilirubin 0.3 - 1.2 mg/dL 0.4 0.3 0.4  Alkaline Phos 38 - 126 U/L 165(H) 114 114  AST 15 - 41 U/L '16 19 22  '$ ALT 0 - 44 U/L '11 14 17      '$ RADIOGRAPHIC STUDIES: I have personally reviewed the radiological images as listed and agreed with the findings in the report. No results found.   ASSESSMENT & PLAN:  Malignant neoplasm of upper-outer quadrant of right breast in female, estrogen receptor negative ([place]) Ms. Lodes appears stable. She completed 2 cycles neoadjuvant taxol/herceptin. She tolerated treatment well overall. She developed worsening neutropenia requiring granix and cycle 3 delay. Her [place] recovered today. Labs otherwise adequate for treatment. Cr is stable in the setting of ESRD on dialysis. I reviewed with pharmacy. Proceed with cycle 3 taxol/herceptin today. She will get granix on the Saturday before each weekly treatment. Will f/u with Dr. Lindi Adie next week.   PLAN: -Labs reviewed, proceed with cycle 3 taxol/herceptin today, continue weekly  -Granix Saturday before each treatment -Lab, f/u with Dr. Lindi Adie in 1 week with cycle 4 -Dialysis MWF  All questions were answered. The patient knows to call the clinic with any problems, questions or concerns. No barriers to learning was detected.     Alla Feeling, NP 08/07/17

## 2017-08-07 NOTE — Patient Instructions (Signed)
[facility] Discharge Instructions for Patients Receiving Chemotherapy  Today you received the following chemotherapy agents Herceptin, Taxol  To help prevent nausea and vomiting after your treatment, we encourage you to take your nausea medication as directed  If you develop nausea and vomiting that is not controlled by your nausea medication, call the clinic.   BELOW ARE SYMPTOMS THAT SHOULD BE REPORTED IMMEDIATELY:  *FEVER GREATER THAN 100.5 F  *CHILLS WITH OR WITHOUT FEVER  NAUSEA AND VOMITING THAT IS NOT CONTROLLED WITH YOUR NAUSEA MEDICATION  *UNUSUAL SHORTNESS OF BREATH  *UNUSUAL BRUISING OR BLEEDING  TENDERNESS IN MOUTH AND THROAT WITH OR WITHOUT PRESENCE OF ULCERS  *URINARY PROBLEMS  *BOWEL PROBLEMS  UNUSUAL RASH Items with * indicate a potential emergency and should be followed up as soon as possible.  Feel free to call the clinic should you have any questions or concerns. The clinic phone number is [phone].  Please show the [place] at check-in to the Emergency Department and triage nurse.

## 2017-08-07 NOTE — Telephone Encounter (Signed)
Ok to tx w/ creat of 6.4 per Regan Rakers, NP.

## 2017-08-07 NOTE — Telephone Encounter (Signed)
Per lab, creatinine is 6.4, I notified Emilee LPN with Cira Rue NP-Emilee LPN will let Lacie NP know,  pt arrived for appt with her today.

## 2017-08-08 ENCOUNTER — Telehealth: Payer: Self-pay | Admitting: Hematology

## 2017-08-08 NOTE — Telephone Encounter (Signed)
No LOS 7/23

## 2017-08-11 ENCOUNTER — Inpatient Hospital Stay: Payer: Medicare Other

## 2017-08-11 VITALS — BP 99/63 | HR 101 | Temp 98.6°F | Resp 18

## 2017-08-11 DIAGNOSIS — Z5111 Encounter for antineoplastic chemotherapy: Secondary | ICD-10-CM | POA: Diagnosis not present

## 2017-08-11 DIAGNOSIS — Z95828 Presence of other vascular implants and grafts: Secondary | ICD-10-CM

## 2017-08-11 MED ORDER — TBO-FILGRASTIM 480 MCG/0.8ML ~~LOC~~ SOSY
PREFILLED_SYRINGE | SUBCUTANEOUS | Status: AC
  Filled 2017-08-11: qty 0.8

## 2017-08-11 MED ORDER — TBO-FILGRASTIM 480 MCG/0.8ML ~~LOC~~ SOSY
480.0000 ug | PREFILLED_SYRINGE | Freq: Once | SUBCUTANEOUS | Status: AC
  Administered 2017-08-11: 480 ug via SUBCUTANEOUS

## 2017-08-11 NOTE — Patient Instructions (Signed)
Tbo-Filgrastim injection What is this medicine? TBO-FILGRASTIM (T B O fil GRA stim) is a granulocyte colony-stimulating factor that stimulates the growth of neutrophils, a type of white blood cell important in the body's fight against infection. It is used to reduce the incidence of fever and infection in patients with certain types of cancer who are receiving chemotherapy that affects the bone marrow. This medicine may be used for other purposes; ask your health care provider or pharmacist if you have questions. COMMON BRAND NAME(S): Granix What should I tell my health care provider before I take this medicine? They need to know if you have any of these conditions: -bone scan or tests planned -kidney disease -sickle cell anemia -an unusual or allergic reaction to tbo-filgrastim, filgrastim, pegfilgrastim, other medicines, foods, dyes, or preservatives -pregnant or trying to get pregnant -breast-feeding How should I use this medicine? This medicine is for injection under the skin. If you get this medicine at home, you will be taught how to prepare and give this medicine. Refer to the Instructions for Use that come with your medication packaging. Use exactly as directed. Take your medicine at regular intervals. Do not take your medicine more often than directed. It is important that you put your used needles and syringes in a special sharps container. Do not put them in a trash can. If you do not have a sharps container, call your pharmacist or healthcare provider to get one. Talk to your pediatrician regarding the use of this medicine in children. Special care may be needed. Overdosage: If you think you have taken too much of this medicine contact a poison control center or emergency room at once. NOTE: This medicine is only for you. Do not share this medicine with others. What if I miss a dose? It is important not to miss your dose. Call your doctor or health care professional if you miss a  dose. What may interact with this medicine? This medicine may interact with the following medications: -medicines that may cause a release of neutrophils, such as lithium This list may not describe all possible interactions. Give your health care provider a list of all the medicines, herbs, non-prescription drugs, or dietary supplements you use. Also tell them if you smoke, drink alcohol, or use illegal drugs. Some items may interact with your medicine. What should I watch for while using this medicine? You may need blood work done while you are taking this medicine. What side effects may I notice from receiving this medicine? Side effects that you should report to your doctor or health care professional as soon as possible: -allergic reactions like skin rash, itching or hives, swelling of the face, lips, or tongue -blood in the urine -dark urine -dizziness -fast heartbeat -feeling faint -shortness of breath or breathing problems -signs and symptoms of infection like fever or chills; cough; or sore throat -signs and symptoms of kidney injury like trouble passing urine or change in the amount of urine -stomach or side pain, or pain at the shoulder -sweating -swelling of the legs, ankles, or abdomen -tiredness Side effects that usually do not require medical attention (report to your doctor or health care professional if they continue or are bothersome): -bone pain -headache -muscle pain -vomiting This list may not describe all possible side effects. Call your doctor for medical advice about side effects. You may report side effects to FDA at 1-800-FDA-1088. Where should I keep my medicine? Keep out of the reach of children. Store in a refrigerator between   2 and 8 degrees C (36 and 46 degrees F). Keep in carton to protect from light. Throw away this medicine if it is left out of the refrigerator for more than 5 consecutive days. Throw away any unused medicine after the expiration  date. NOTE: This sheet is a summary. It may not cover all possible information. If you have questions about this medicine, talk to your doctor, pharmacist, or health care provider.  2018 Elsevier/Gold Standard (2015-02-22 19:07:04)  

## 2017-08-14 ENCOUNTER — Inpatient Hospital Stay (HOSPITAL_BASED_OUTPATIENT_CLINIC_OR_DEPARTMENT_OTHER): Payer: Medicare Other | Admitting: Hematology and Oncology

## 2017-08-14 ENCOUNTER — Telehealth: Payer: Self-pay

## 2017-08-14 ENCOUNTER — Inpatient Hospital Stay: Payer: Medicare Other

## 2017-08-14 ENCOUNTER — Encounter: Payer: Self-pay | Admitting: *Deleted

## 2017-08-14 DIAGNOSIS — Z171 Estrogen receptor negative status [ER-]: Secondary | ICD-10-CM

## 2017-08-14 DIAGNOSIS — D631 Anemia in chronic kidney disease: Secondary | ICD-10-CM

## 2017-08-14 DIAGNOSIS — Z992 Dependence on renal dialysis: Secondary | ICD-10-CM

## 2017-08-14 DIAGNOSIS — Z79899 Other long term (current) drug therapy: Secondary | ICD-10-CM

## 2017-08-14 DIAGNOSIS — I129 Hypertensive chronic kidney disease with stage 1 through stage 4 chronic kidney disease, or unspecified chronic kidney disease: Secondary | ICD-10-CM

## 2017-08-14 DIAGNOSIS — C50411 Malignant neoplasm of upper-outer quadrant of right female breast: Secondary | ICD-10-CM

## 2017-08-14 DIAGNOSIS — Z95828 Presence of other vascular implants and grafts: Secondary | ICD-10-CM

## 2017-08-14 DIAGNOSIS — D6481 Anemia due to antineoplastic chemotherapy: Secondary | ICD-10-CM

## 2017-08-14 DIAGNOSIS — Z5111 Encounter for antineoplastic chemotherapy: Secondary | ICD-10-CM | POA: Diagnosis not present

## 2017-08-14 DIAGNOSIS — N186 End stage renal disease: Secondary | ICD-10-CM

## 2017-08-14 LAB — CMP (CANCER CENTER ONLY)
ALBUMIN: 3.5 g/dL (ref 3.5–5.0)
ALK PHOS: 168 U/L — AB (ref 38–126)
ALT: 10 U/L (ref 0–44)
AST: 13 U/L — ABNORMAL LOW (ref 15–41)
Anion gap: 12 (ref 5–15)
BILIRUBIN TOTAL: 0.4 mg/dL (ref 0.3–1.2)
BUN: 37 mg/dL — ABNORMAL HIGH (ref 8–23)
CALCIUM: 9 mg/dL (ref 8.9–10.3)
CO2: 33 mmol/L — ABNORMAL HIGH (ref 22–32)
Chloride: 100 mmol/L (ref 98–111)
Creatinine: 6.59 mg/dL (ref 0.44–1.00)
GFR, Est AFR Am: 7 mL/min — ABNORMAL LOW (ref >60)
GFR, Estimated: 6 mL/min — ABNORMAL LOW (ref >60)
GLUCOSE: 119 mg/dL — AB (ref 70–99)
Potassium: 4.3 mmol/L (ref 3.5–5.1)
Sodium: 145 mmol/L (ref 135–145)
TOTAL PROTEIN: 6.8 g/dL (ref 6.5–8.1)

## 2017-08-14 LAB — CBC WITH DIFFERENTIAL (CANCER CENTER ONLY)
Basophils Absolute: 0 10*3/uL (ref 0.0–0.1)
Basophils Relative: 1 %
EOS PCT: 4 %
Eosinophils Absolute: 0.2 10*3/uL (ref 0.0–0.5)
HEMATOCRIT: 28 % — AB (ref 34.8–46.6)
Hemoglobin: 8.8 g/dL — ABNORMAL LOW (ref 11.6–15.9)
LYMPHS ABS: 0.9 10*3/uL (ref 0.9–3.3)
LYMPHS PCT: 19 %
MCH: 31.5 pg (ref 25.1–34.0)
MCHC: 31.4 g/dL — ABNORMAL LOW (ref 31.5–36.0)
MCV: 100.4 fL (ref 79.5–101.0)
MONO ABS: 0.3 10*3/uL (ref 0.1–0.9)
Monocytes Relative: 6 %
Neutro Abs: 3.3 10*3/uL (ref 1.5–6.5)
Neutrophils Relative %: 70 %
Platelet Count: 213 10*3/uL (ref 145–400)
RBC: 2.79 MIL/uL — ABNORMAL LOW (ref 3.70–5.45)
RDW: 17.8 % — ABNORMAL HIGH (ref 11.2–14.5)
WBC Count: 4.6 10*3/uL (ref 3.9–10.3)

## 2017-08-14 MED ORDER — HEPARIN SOD (PORK) LOCK FLUSH 100 UNIT/ML IV SOLN
500.0000 [IU] | Freq: Once | INTRAVENOUS | Status: AC | PRN
  Administered 2017-08-14: 500 [IU]
  Filled 2017-08-14: qty 5

## 2017-08-14 MED ORDER — FAMOTIDINE IN NACL 20-0.9 MG/50ML-% IV SOLN
20.0000 mg | Freq: Once | INTRAVENOUS | Status: AC
  Administered 2017-08-14: 20 mg via INTRAVENOUS

## 2017-08-14 MED ORDER — DIPHENHYDRAMINE HCL 50 MG/ML IJ SOLN
25.0000 mg | Freq: Once | INTRAMUSCULAR | Status: AC
  Administered 2017-08-14: 25 mg via INTRAVENOUS

## 2017-08-14 MED ORDER — ACETAMINOPHEN 325 MG PO TABS
650.0000 mg | ORAL_TABLET | Freq: Once | ORAL | Status: AC
  Administered 2017-08-14: 650 mg via ORAL

## 2017-08-14 MED ORDER — DEXAMETHASONE SODIUM PHOSPHATE 10 MG/ML IJ SOLN
10.0000 mg | Freq: Once | INTRAMUSCULAR | Status: AC
  Administered 2017-08-14: 10 mg via INTRAVENOUS

## 2017-08-14 MED ORDER — SODIUM CHLORIDE 0.9 % IV SOLN
2.0000 mg/kg | Freq: Once | INTRAVENOUS | Status: AC
  Administered 2017-08-14: 168 mg via INTRAVENOUS
  Filled 2017-08-14: qty 8

## 2017-08-14 MED ORDER — DEXAMETHASONE SODIUM PHOSPHATE 10 MG/ML IJ SOLN
INTRAMUSCULAR | Status: AC
  Filled 2017-08-14: qty 1

## 2017-08-14 MED ORDER — SODIUM CHLORIDE 0.9% FLUSH
10.0000 mL | INTRAVENOUS | Status: DC | PRN
  Administered 2017-08-14: 10 mL
  Filled 2017-08-14: qty 10

## 2017-08-14 MED ORDER — ACETAMINOPHEN 325 MG PO TABS
ORAL_TABLET | ORAL | Status: AC
Start: 2017-08-14 — End: ?
  Filled 2017-08-14: qty 2

## 2017-08-14 MED ORDER — FAMOTIDINE IN NACL 20-0.9 MG/50ML-% IV SOLN
INTRAVENOUS | Status: AC
  Filled 2017-08-14: qty 50

## 2017-08-14 MED ORDER — SODIUM CHLORIDE 0.9 % IV SOLN
60.0000 mg/m2 | Freq: Once | INTRAVENOUS | Status: AC
  Administered 2017-08-14: 114 mg via INTRAVENOUS
  Filled 2017-08-14: qty 19

## 2017-08-14 MED ORDER — SODIUM CHLORIDE 0.9 % IV SOLN
Freq: Once | INTRAVENOUS | Status: AC
  Administered 2017-08-14: 14:00:00 via INTRAVENOUS
  Filled 2017-08-14: qty 250

## 2017-08-14 MED ORDER — DIPHENHYDRAMINE HCL 50 MG/ML IJ SOLN
INTRAMUSCULAR | Status: AC
  Filled 2017-08-14: qty 1

## 2017-08-14 NOTE — Assessment & Plan Note (Signed)
06/19/2017: Right breast skin thickening and heaviness: Screening mammogram detected 2 cm of microcalcifications in the right breast in the posterior third UOQ.?  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  2. neoadjuvant chemotherapy with Taxol Herceptin weekly x12 followed by Herceptin maintenance for 1 year 3.  Followed by surgery with either lumpectomy or mastectomy depending on the MRI findings and treatment response 4.  Followed by adjuvant radiation if she undergoes breast conserving surgery 5.  Followed by adjuvant antiestrogen therapy  -------------------------------------------------------------------------------- Current treatment: Cycle 4 day 1 Taxol Herceptin (with Granix support) Labs have been reviewed  Chemo Toxicities: 1.Neutropenia: With Granix being given on the Saturday prior to treatment, counts are remaining stable 2.mild fatigue 3.Chemotherapy-induced anemia on top of anemia due to chronic kidney disease being monitored closely  Return to clinic weekly for chemotherapy and every other week for follow-up with me

## 2017-08-14 NOTE — Telephone Encounter (Signed)
Received Creatinine results of 6.59 for patient. Notified Dr Lindi Adie.

## 2017-08-14 NOTE — Progress Notes (Signed)
Patient Care Team: Lucianne Lei, MD as PCP - General (Family Medicine) Donato Heinz, MD (Nephrology) Lorretta Harp, MD as Consulting Physician (Cardiology) Pyrtle, Lajuan Lines, MD as Consulting Physician (Gastroenterology)  DIAGNOSIS:  Encounter Diagnosis  Name Primary?  . Malignant neoplasm of upper-outer quadrant of right breast in female, estrogen receptor negative ([place])     SUMMARY OF ONCOLOGIC HISTORY:   Malignant neoplasm of upper-outer quadrant of right breast in female, estrogen receptor negative ([place])   06/19/2017 Initial Diagnosis    Right breast skin thickening and heaviness: Screening mammogram detected 2 cm of microcalcifications in the right breast in the posterior third UOQ.?  Skin involvement, stereotactic biopsy revealed IDC grade 2-3 with high-grade DCIS, lymphovascular invasion present, ER 0%, PR 0%, HER-2 positive ratio 8.69, gene copy #11.3 T1CN0 stage Ia      07/02/2017 Cancer Staging    Staging form: Breast, AJCC 8th Edition - Clinical: Stage IA (cT1c, cN0, cM0, G3, ER-, PR-, HER2+) - Signed by Nicholas Lose, MD on 07/02/2017      07/17/2017 -  Chemotherapy    Taxol Herceptin weekly x12 followed by Herceptin maintenance x1 year       CHIEF COMPLIANT: Cycle 4 Taxol with Herceptin  INTERVAL HISTORY: Karen [name] is a 62 year old with above-mentioned history of right breast cancer currently on adjuvant chemotherapy with Taxol Herceptin today is cycle 4 of her treatment.  Since the last cycle her hair completely fell out.  Denies any nausea vomiting.  Denies neuropathy.  She continues to have fatigue from anemia due to chronic kidney disease.  She started getting Granix injections and because of that her white count is doing much better now.  REVIEW OF SYSTEMS:   Constitutional: Denies fevers, chills or abnormal weight loss Eyes: Denies blurriness of vision Ears, nose, mouth, throat, and face: Denies mucositis or sore throat Respiratory: Denies cough,  dyspnea or wheezes Cardiovascular: Denies palpitation, chest discomfort Gastrointestinal:  Denies nausea, heartburn or change in bowel habits Skin: Denies abnormal skin rashes Lymphatics: Denies new lymphadenopathy or easy bruising Neurological:Denies numbness, tingling or new weaknesses Behavioral/Psych: Mood is stable, no new changes  Extremities: No lower extremity edema Breast:  denies any pain or lumps or nodules in either breasts All other systems were reviewed with the patient and are negative.  I have reviewed the past medical history, past surgical history, social history and family history with the patient and they are unchanged from previous note.  ALLERGIES:  has No Known Allergies.  MEDICATIONS:  Current Outpatient Medications  Medication Sig Dispense Refill  . acetaminophen (TYLENOL) 500 MG tablet Take 1,000 mg by mouth daily as needed for mild pain, moderate pain or headache.     Marland Kitchen amoxicillin (AMOXIL) 500 MG capsule Take 2,000 mg by mouth See admin instructions. Take 4 capsules (2000 mg) by mouth one hour prior to dental procedures    . aspirin EC 81 MG tablet Take 81 mg by mouth daily.    . Darbepoetin Alfa-Albumin (ARANESP IJ) Inject as directed every 30 (thirty) days.    Marland Kitchen diltiazem (CARDIZEM) 30 MG tablet Take 1/2 tablet the morning of dialysis (Patient taking differently: Take 15 mg by mouth See admin instructions. Take 1/2 tablet the morning of dialysis) 45 tablet 1  . lidocaine-prilocaine (EMLA) cream Apply 1 application topically See admin instructions. Apply topically Monday, Wednesday and Friday before dialysis  6  . multivitamin (RENA-VIT) TABS tablet Take 1 tablet by mouth daily.    . ondansetron (ZOFRAN) 8  MG tablet Take 1 tablet (8 mg total) by mouth 2 (two) times daily as needed (Nausea or vomiting). 30 tablet 1  . pantoprazole (PROTONIX) 40 MG tablet TAKE 1 TABLET (40 MG TOTAL) BY MOUTH DAILY. 90 tablet 1  . prochlorperazine (COMPAZINE) 10 MG tablet Take 1  tablet (10 mg total) by mouth every 6 (six) hours as needed (Nausea or vomiting). 30 tablet 1  . sevelamer carbonate (RENVELA) 800 MG tablet Take 800-2,400 mg by mouth See admin instructions. 2,'400mg'$  three times daily and '800mg'$  twice daily with snacks    . simvastatin (ZOCOR) 20 MG tablet Take 1 tablet (20 mg total) by mouth at bedtime. 30 tablet 11   No current facility-administered medications for this visit.     PHYSICAL EXAMINATION: ECOG PERFORMANCE STATUS: 1 - Symptomatic but completely ambulatory  Vitals:   08/14/17 1205  BP: (!) 101/51  Pulse: 81  Resp: 18  Temp: 97.8 F (36.6 C)  SpO2: 100%   Filed Weights   08/14/17 1205  Weight: 190 lb 3.2 oz (86.3 kg)    GENERAL:alert, no distress and comfortable SKIN: skin color, texture, turgor are normal, no rashes or significant lesions EYES: normal, Conjunctiva are pink and non-injected, sclera clear OROPHARYNX:no exudate, no erythema and lips, buccal mucosa, and tongue normal  NECK: supple, thyroid normal size, non-tender, without nodularity LYMPH:  no palpable lymphadenopathy in the cervical, axillary or inguinal LUNGS: clear to auscultation and percussion with normal breathing effort HEART: regular rate & rhythm and no murmurs and no lower extremity edema ABDOMEN:abdomen soft, non-tender and normal bowel sounds MUSCULOSKELETAL:no cyanosis of digits and no clubbing  NEURO: alert & oriented x 3 with fluent speech, no focal motor/sensory deficits EXTREMITIES: No lower extremity edema   LABORATORY DATA:  I have reviewed the data as listed CMP Latest Ref Rng & Units 08/07/2017 07/31/2017 07/24/2017  Glucose 70 - 99 mg/dL 102(H) 97 91  BUN 8 - 23 mg/dL 30(H) 32(H) 25(H)  Creatinine 0.44 - 1.00 mg/dL 6.44(HH) 6.68(HH) 6.02(HH)  Sodium 135 - 145 mmol/L 144 142 142  Potassium 3.5 - 5.1 mmol/L 4.3 4.7 4.5  Chloride 98 - 111 mmol/L 101 99 98  CO2 22 - 32 mmol/L 30 34(H) 34(H)  Calcium 8.9 - 10.3 mg/dL 9.4 9.0 9.8  Total Protein 6.5  - 8.1 g/dL 7.0 6.7 7.1  Total Bilirubin 0.3 - 1.2 mg/dL 0.4 0.3 0.4  Alkaline Phos 38 - 126 U/L 165(H) 114 114  AST 15 - 41 U/L '16 19 22  '$ ALT 0 - 44 U/L '11 14 17    '$ Lab Results  Component Value Date   WBC 4.6 08/14/2017   HGB 8.8 (L) 08/14/2017   HCT 28.0 (L) 08/14/2017   MCV 100.4 08/14/2017   PLT 213 08/14/2017   NEUTROABS 3.3 08/14/2017    ASSESSMENT & PLAN:  Malignant neoplasm of upper-outer quadrant of right breast in female, estrogen receptor negative ([place]) 06/19/2017: Right breast skin thickening and heaviness: Screening mammogram detected 2 cm of microcalcifications in the right breast in the posterior third UOQ.?  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  2. neoadjuvant chemotherapy with Taxol Herceptin weekly x12 followed by Herceptin maintenance for 1 year 3.  Followed by surgery with either lumpectomy or mastectomy depending on the MRI findings and treatment response 4.  Followed by adjuvant radiation if she  undergoes breast conserving surgery 5.  Followed by adjuvant antiestrogen therapy  -------------------------------------------------------------------------------- Current treatment: Cycle 4 day 1 Taxol Herceptin (with Granix support) Labs have been reviewed  Chemo Toxicities: 1.Neutropenia: With Granix being given on the Saturday prior to treatment, counts are remaining stable 2.mild fatigue 3.Chemotherapy-induced anemia on top of anemia due to chronic kidney disease being monitored closely, today's hemoglobin is 8.8  Return to clinic weekly for chemotherapy and every other week for follow-up with me  No orders of the defined types were placed in this encounter.  The patient has a good understanding of the overall plan. she agrees with it. she will call with any problems  that may develop before the next visit here.   Harriette Ohara, MD 08/14/17

## 2017-08-14 NOTE — Patient Instructions (Signed)
[facility] Discharge Instructions for Patients Receiving Chemotherapy  Today you received the following chemotherapy agents: Trastuzumab (Herceptin) and Paclitaxel (Taxol)  To help prevent nausea and vomiting after your treatment, we encourage you to take your nausea medication  as prescribed.    If you develop nausea and vomiting that is not controlled by your nausea medication, call the clinic.   BELOW ARE SYMPTOMS THAT SHOULD BE REPORTED IMMEDIATELY:  *FEVER GREATER THAN 100.5 F  *CHILLS WITH OR WITHOUT FEVER  NAUSEA AND VOMITING THAT IS NOT CONTROLLED WITH YOUR NAUSEA MEDICATION  *UNUSUAL SHORTNESS OF BREATH  *UNUSUAL BRUISING OR BLEEDING  TENDERNESS IN MOUTH AND THROAT WITH OR WITHOUT PRESENCE OF ULCERS  *URINARY PROBLEMS  *BOWEL PROBLEMS  UNUSUAL RASH Items with * indicate a potential emergency and should be followed up as soon as possible.  Feel free to call the clinic should you have any questions or concerns. The clinic phone number is [phone].  Please show the CHEMO ALERT CARD at check-in to the Emergency Department and triage nurse.   

## 2017-08-18 ENCOUNTER — Inpatient Hospital Stay: Payer: Medicare Other | Attending: Hematology and Oncology

## 2017-08-18 VITALS — BP 107/63 | HR 89 | Temp 98.5°F | Resp 18

## 2017-08-18 DIAGNOSIS — I129 Hypertensive chronic kidney disease with stage 1 through stage 4 chronic kidney disease, or unspecified chronic kidney disease: Secondary | ICD-10-CM | POA: Diagnosis not present

## 2017-08-18 DIAGNOSIS — Z171 Estrogen receptor negative status [ER-]: Secondary | ICD-10-CM | POA: Insufficient documentation

## 2017-08-18 DIAGNOSIS — Z5111 Encounter for antineoplastic chemotherapy: Secondary | ICD-10-CM | POA: Diagnosis present

## 2017-08-18 DIAGNOSIS — Z992 Dependence on renal dialysis: Secondary | ICD-10-CM | POA: Diagnosis not present

## 2017-08-18 DIAGNOSIS — D631 Anemia in chronic kidney disease: Secondary | ICD-10-CM | POA: Insufficient documentation

## 2017-08-18 DIAGNOSIS — C50411 Malignant neoplasm of upper-outer quadrant of right female breast: Secondary | ICD-10-CM | POA: Insufficient documentation

## 2017-08-18 DIAGNOSIS — Z79899 Other long term (current) drug therapy: Secondary | ICD-10-CM | POA: Insufficient documentation

## 2017-08-18 DIAGNOSIS — Z95828 Presence of other vascular implants and grafts: Secondary | ICD-10-CM

## 2017-08-18 DIAGNOSIS — N189 Chronic kidney disease, unspecified: Secondary | ICD-10-CM | POA: Insufficient documentation

## 2017-08-18 DIAGNOSIS — D6481 Anemia due to antineoplastic chemotherapy: Secondary | ICD-10-CM | POA: Diagnosis not present

## 2017-08-18 MED ORDER — TBO-FILGRASTIM 480 MCG/0.8ML ~~LOC~~ SOSY
480.0000 ug | PREFILLED_SYRINGE | Freq: Once | SUBCUTANEOUS | Status: AC
  Administered 2017-08-18: 480 ug via SUBCUTANEOUS

## 2017-08-18 MED ORDER — TBO-FILGRASTIM 480 MCG/0.8ML ~~LOC~~ SOSY
PREFILLED_SYRINGE | SUBCUTANEOUS | Status: AC
  Filled 2017-08-18: qty 0.8

## 2017-08-18 NOTE — Patient Instructions (Signed)
Tbo-Filgrastim injection What is this medicine? TBO-FILGRASTIM (T B O fil GRA stim) is a granulocyte colony-stimulating factor that stimulates the growth of neutrophils, a type of white blood cell important in the body's fight against infection. It is used to reduce the incidence of fever and infection in patients with certain types of cancer who are receiving chemotherapy that affects the bone marrow. This medicine may be used for other purposes; ask your health care provider or pharmacist if you have questions. COMMON BRAND NAME(S): Granix What should I tell my health care provider before I take this medicine? They need to know if you have any of these conditions: -bone scan or tests planned -kidney disease -sickle cell anemia -an unusual or allergic reaction to tbo-filgrastim, filgrastim, pegfilgrastim, other medicines, foods, dyes, or preservatives -pregnant or trying to get pregnant -breast-feeding How should I use this medicine? This medicine is for injection under the skin. If you get this medicine at home, you will be taught how to prepare and give this medicine. Refer to the Instructions for Use that come with your medication packaging. Use exactly as directed. Take your medicine at regular intervals. Do not take your medicine more often than directed. It is important that you put your used needles and syringes in a special sharps container. Do not put them in a trash can. If you do not have a sharps container, call your pharmacist or healthcare provider to get one. Talk to your pediatrician regarding the use of this medicine in children. Special care may be needed. Overdosage: If you think you have taken too much of this medicine contact a poison control center or emergency room at once. NOTE: This medicine is only for you. Do not share this medicine with others. What if I miss a dose? It is important not to miss your dose. Call your doctor or health care professional if you miss a  dose. What may interact with this medicine? This medicine may interact with the following medications: -medicines that may cause a release of neutrophils, such as lithium This list may not describe all possible interactions. Give your health care provider a list of all the medicines, herbs, non-prescription drugs, or dietary supplements you use. Also tell them if you smoke, drink alcohol, or use illegal drugs. Some items may interact with your medicine. What should I watch for while using this medicine? You may need blood work done while you are taking this medicine. What side effects may I notice from receiving this medicine? Side effects that you should report to your doctor or health care professional as soon as possible: -allergic reactions like skin rash, itching or hives, swelling of the face, lips, or tongue -blood in the urine -dark urine -dizziness -fast heartbeat -feeling faint -shortness of breath or breathing problems -signs and symptoms of infection like fever or chills; cough; or sore throat -signs and symptoms of kidney injury like trouble passing urine or change in the amount of urine -stomach or side pain, or pain at the shoulder -sweating -swelling of the legs, ankles, or abdomen -tiredness Side effects that usually do not require medical attention (report to your doctor or health care professional if they continue or are bothersome): -bone pain -headache -muscle pain -vomiting This list may not describe all possible side effects. Call your doctor for medical advice about side effects. You may report side effects to FDA at 1-800-FDA-1088. Where should I keep my medicine? Keep out of the reach of children. Store in a refrigerator between   2 and 8 degrees C (36 and 46 degrees F). Keep in carton to protect from light. Throw away this medicine if it is left out of the refrigerator for more than 5 consecutive days. Throw away any unused medicine after the expiration  date. NOTE: This sheet is a summary. It may not cover all possible information. If you have questions about this medicine, talk to your doctor, pharmacist, or health care provider.  2018 Elsevier/Gold Standard (2015-02-22 19:07:04)  

## 2017-08-20 ENCOUNTER — Emergency Department (HOSPITAL_COMMUNITY)
Admission: EM | Admit: 2017-08-20 | Discharge: 2017-08-21 | Disposition: A | Discharge status: Home / Self Care | Payer: Medicare Other | Attending: Emergency Medicine | Admitting: Emergency Medicine

## 2017-08-20 ENCOUNTER — Other Ambulatory Visit: Payer: Self-pay

## 2017-08-20 DIAGNOSIS — Z5321 Procedure and treatment not carried out due to patient leaving prior to being seen by health care provider: Secondary | ICD-10-CM | POA: Diagnosis not present

## 2017-08-20 DIAGNOSIS — Z452 Encounter for adjustment and management of vascular access device: Secondary | ICD-10-CM | POA: Insufficient documentation

## 2017-08-20 NOTE — ED Triage Notes (Addendum)
Patient states that after she got out of her shower this evening, she noticed that she could not feel her dialysis access pulsating. This nurse found no bruit/thrill. Dialysis was completed today.

## 2017-08-20 NOTE — ED Notes (Signed)
Pt called for triage x 1, no answer.

## 2017-08-20 NOTE — ED Notes (Signed)
Pt called again for triage x 2, no answer.

## 2017-08-20 NOTE — ED Notes (Signed)
Pt found in waiting room, pt has not been triaged, was called for triage but never answered. Pt being taken back to triage

## 2017-08-21 ENCOUNTER — Inpatient Hospital Stay: Payer: Medicare Other

## 2017-08-21 ENCOUNTER — Other Ambulatory Visit: Payer: Self-pay | Admitting: Hematology and Oncology

## 2017-08-21 VITALS — BP 105/67 | HR 85 | Temp 98.8°F | Resp 16

## 2017-08-21 DIAGNOSIS — C50411 Malignant neoplasm of upper-outer quadrant of right female breast: Secondary | ICD-10-CM

## 2017-08-21 DIAGNOSIS — Z95828 Presence of other vascular implants and grafts: Secondary | ICD-10-CM

## 2017-08-21 DIAGNOSIS — Z171 Estrogen receptor negative status [ER-]: Principal | ICD-10-CM

## 2017-08-21 DIAGNOSIS — Z5111 Encounter for antineoplastic chemotherapy: Secondary | ICD-10-CM | POA: Diagnosis not present

## 2017-08-21 LAB — CMP (CANCER CENTER ONLY)
ALBUMIN: 3.5 g/dL (ref 3.5–5.0)
ALK PHOS: 170 U/L — AB (ref 38–126)
ALT: 14 U/L (ref 0–44)
AST: 15 U/L (ref 15–41)
Anion gap: 13 (ref 5–15)
BILIRUBIN TOTAL: 0.4 mg/dL (ref 0.3–1.2)
BUN: 27 mg/dL — AB (ref 8–23)
CO2: 31 mmol/L (ref 22–32)
CREATININE: 6.68 mg/dL — AB (ref 0.44–1.00)
Calcium: 8.8 mg/dL — ABNORMAL LOW (ref 8.9–10.3)
Chloride: 99 mmol/L (ref 98–111)
GFR, Est AFR Am: 7 mL/min — ABNORMAL LOW (ref >60)
GFR, Estimated: 6 mL/min — ABNORMAL LOW (ref >60)
GLUCOSE: 107 mg/dL — AB (ref 70–99)
Potassium: 4.3 mmol/L (ref 3.5–5.1)
SODIUM: 143 mmol/L (ref 135–145)
TOTAL PROTEIN: 6.7 g/dL (ref 6.5–8.1)

## 2017-08-21 LAB — CBC WITH DIFFERENTIAL (CANCER CENTER ONLY)
BASOS PCT: 1 %
Basophils Absolute: 0.1 10*3/uL (ref 0.0–0.1)
EOS ABS: 0.2 10*3/uL (ref 0.0–0.5)
EOS PCT: 4 %
HEMATOCRIT: 25.2 % — AB (ref 34.8–46.6)
Hemoglobin: 8.4 g/dL — ABNORMAL LOW (ref 11.6–15.9)
Lymphocytes Relative: 16 %
Lymphs Abs: 0.9 10*3/uL (ref 0.9–3.3)
MCH: 32.7 pg (ref 25.1–34.0)
MCHC: 33.2 g/dL (ref 31.5–36.0)
MCV: 98.6 fL (ref 79.5–101.0)
MONO ABS: 0.4 10*3/uL (ref 0.1–0.9)
MONOS PCT: 8 %
NEUTROS ABS: 3.8 10*3/uL (ref 1.5–6.5)
Neutrophils Relative %: 71 %
PLATELETS: 270 10*3/uL (ref 145–400)
RBC: 2.55 MIL/uL — ABNORMAL LOW (ref 3.70–5.45)
RDW: 18.9 % — AB (ref 11.2–14.5)
WBC Count: 5.4 10*3/uL (ref 3.9–10.3)

## 2017-08-21 MED ORDER — ACETAMINOPHEN 325 MG PO TABS
ORAL_TABLET | ORAL | Status: AC
  Filled 2017-08-21: qty 2

## 2017-08-21 MED ORDER — ACETAMINOPHEN 325 MG PO TABS
650.0000 mg | ORAL_TABLET | Freq: Once | ORAL | Status: AC
  Administered 2017-08-21: 650 mg via ORAL

## 2017-08-21 MED ORDER — FAMOTIDINE IN NACL 20-0.9 MG/50ML-% IV SOLN
INTRAVENOUS | Status: AC
  Filled 2017-08-21: qty 50

## 2017-08-21 MED ORDER — SODIUM CHLORIDE 0.9 % IV SOLN
60.0000 mg/m2 | Freq: Once | INTRAVENOUS | Status: AC
  Administered 2017-08-21: 114 mg via INTRAVENOUS
  Filled 2017-08-21: qty 19

## 2017-08-21 MED ORDER — DEXAMETHASONE SODIUM PHOSPHATE 10 MG/ML IJ SOLN
INTRAMUSCULAR | Status: AC
  Filled 2017-08-21: qty 1

## 2017-08-21 MED ORDER — DIPHENHYDRAMINE HCL 50 MG/ML IJ SOLN
INTRAMUSCULAR | Status: AC
  Filled 2017-08-21: qty 1

## 2017-08-21 MED ORDER — SODIUM CHLORIDE 0.9 % IV SOLN
Freq: Once | INTRAVENOUS | Status: AC
  Administered 2017-08-21: 14:00:00 via INTRAVENOUS
  Filled 2017-08-21: qty 250

## 2017-08-21 MED ORDER — DEXAMETHASONE SODIUM PHOSPHATE 10 MG/ML IJ SOLN
10.0000 mg | Freq: Once | INTRAMUSCULAR | Status: AC
  Administered 2017-08-21: 10 mg via INTRAVENOUS

## 2017-08-21 MED ORDER — FAMOTIDINE IN NACL 20-0.9 MG/50ML-% IV SOLN
20.0000 mg | Freq: Once | INTRAVENOUS | Status: AC
  Administered 2017-08-21: 20 mg via INTRAVENOUS

## 2017-08-21 MED ORDER — SODIUM CHLORIDE 0.9% FLUSH
10.0000 mL | INTRAVENOUS | Status: DC | PRN
  Administered 2017-08-21: 10 mL
  Filled 2017-08-21: qty 10

## 2017-08-21 MED ORDER — DIPHENHYDRAMINE HCL 50 MG/ML IJ SOLN
25.0000 mg | Freq: Once | INTRAMUSCULAR | Status: AC
  Administered 2017-08-21: 25 mg via INTRAVENOUS

## 2017-08-21 MED ORDER — TRASTUZUMAB CHEMO 150 MG IV SOLR
2.0000 mg/kg | Freq: Once | INTRAVENOUS | Status: AC
  Administered 2017-08-21: 168 mg via INTRAVENOUS
  Filled 2017-08-21: qty 8

## 2017-08-21 MED ORDER — HEPARIN SOD (PORK) LOCK FLUSH 100 UNIT/ML IV SOLN
500.0000 [IU] | Freq: Once | INTRAVENOUS | Status: AC | PRN
Start: 2017-08-21 — End: 2017-08-21
  Administered 2017-08-21: 500 [IU]
  Filled 2017-08-21: qty 5

## 2017-08-21 NOTE — Progress Notes (Signed)
Lab called to report critical creatinine, Dr. Lindi Adie aware.  Oka for treatment today with creatinine levels.

## 2017-08-21 NOTE — ED Notes (Signed)
Pt upset about wait time and stated we do not care that she has cancer and is a dialysis PT. Pt stated she is not waiting any longer and she is leaving.

## 2017-08-21 NOTE — Patient Instructions (Signed)
[facility] Discharge Instructions for Patients Receiving Chemotherapy  Today you received the following chemotherapy agents: Trastuzumab (Herceptin) and Paclitaxel (Taxol)  To help prevent nausea and vomiting after your treatment, we encourage you to take your nausea medication  as prescribed.    If you develop nausea and vomiting that is not controlled by your nausea medication, call the clinic.   BELOW ARE SYMPTOMS THAT SHOULD BE REPORTED IMMEDIATELY:  *FEVER GREATER THAN 100.5 F  *CHILLS WITH OR WITHOUT FEVER  NAUSEA AND VOMITING THAT IS NOT CONTROLLED WITH YOUR NAUSEA MEDICATION  *UNUSUAL SHORTNESS OF BREATH  *UNUSUAL BRUISING OR BLEEDING  TENDERNESS IN MOUTH AND THROAT WITH OR WITHOUT PRESENCE OF ULCERS  *URINARY PROBLEMS  *BOWEL PROBLEMS  UNUSUAL RASH Items with * indicate a potential emergency and should be followed up as soon as possible.  Feel free to call the clinic should you have any questions or concerns. The clinic phone number is [phone].  Please show the CHEMO ALERT CARD at check-in to the Emergency Department and triage nurse.   

## 2017-08-23 ENCOUNTER — Ambulatory Visit (HOSPITAL_COMMUNITY)
Admission: RE | Admit: 2017-08-23 | Discharge: 2017-08-23 | Disposition: A | Discharge status: Home / Self Care | Payer: Medicare Other | Source: Ambulatory Visit | Attending: Nurse Practitioner | Admitting: Nurse Practitioner

## 2017-08-23 ENCOUNTER — Encounter (HOSPITAL_COMMUNITY): Payer: Self-pay | Admitting: Nurse Practitioner

## 2017-08-23 ENCOUNTER — Other Ambulatory Visit: Payer: Self-pay

## 2017-08-23 VITALS — BP 116/68 | HR 92 | Ht 61.0 in | Wt 190.0 lb

## 2017-08-23 DIAGNOSIS — I959 Hypotension, unspecified: Secondary | ICD-10-CM | POA: Insufficient documentation

## 2017-08-23 DIAGNOSIS — Z833 Family history of diabetes mellitus: Secondary | ICD-10-CM | POA: Diagnosis not present

## 2017-08-23 DIAGNOSIS — I480 Paroxysmal atrial fibrillation: Secondary | ICD-10-CM | POA: Insufficient documentation

## 2017-08-23 DIAGNOSIS — Z7982 Long term (current) use of aspirin: Secondary | ICD-10-CM | POA: Diagnosis not present

## 2017-08-23 DIAGNOSIS — Z79899 Other long term (current) drug therapy: Secondary | ICD-10-CM | POA: Diagnosis not present

## 2017-08-23 DIAGNOSIS — Z8249 Family history of ischemic heart disease and other diseases of the circulatory system: Secondary | ICD-10-CM | POA: Insufficient documentation

## 2017-08-23 DIAGNOSIS — K219 Gastro-esophageal reflux disease without esophagitis: Secondary | ICD-10-CM | POA: Insufficient documentation

## 2017-08-23 DIAGNOSIS — C50919 Malignant neoplasm of unspecified site of unspecified female breast: Secondary | ICD-10-CM | POA: Diagnosis not present

## 2017-08-23 DIAGNOSIS — Z9049 Acquired absence of other specified parts of digestive tract: Secondary | ICD-10-CM | POA: Insufficient documentation

## 2017-08-23 DIAGNOSIS — Z90710 Acquired absence of both cervix and uterus: Secondary | ICD-10-CM | POA: Diagnosis not present

## 2017-08-23 DIAGNOSIS — Z9889 Other specified postprocedural states: Secondary | ICD-10-CM | POA: Diagnosis not present

## 2017-08-23 DIAGNOSIS — Z992 Dependence on renal dialysis: Secondary | ICD-10-CM | POA: Diagnosis not present

## 2017-08-23 DIAGNOSIS — I120 Hypertensive chronic kidney disease with stage 5 chronic kidney disease or end stage renal disease: Secondary | ICD-10-CM | POA: Diagnosis not present

## 2017-08-23 DIAGNOSIS — N186 End stage renal disease: Secondary | ICD-10-CM | POA: Diagnosis not present

## 2017-08-23 NOTE — Progress Notes (Signed)
Primary Care Physician: Lucianne Lei, MD Referring Physician: [facility] ER f/u Cardiologist: Dr. Mattie Marlin [name] is a 62 y.o. female with a h/o ESRD on dialysis, failed transplant in 2016, HTN in the past but not treated for HTN currently,that has been having a  spells of palpitations during dialysis. Thuis is mentioned in her chart back to 2017. She was seen in the ED 11/24/15 and 12 /14/18 for same. She converted in the ER after just a few mins after arrival, ekg appeared to be be flutter. Ekg seen form 11/24/2015 flutter vrs fib. She is on ASA for a chadsvasc score of 1. She is on metoprolol 25 mg at hs only because her BP drops with dialysis if she takes her am dose. She has only had the arrhythmia following with dialysis.  F/u in afib clinic from ER, 12/20, she was given prn 30 mg Cardizem to use during dialysis and she did use once during dialysis but did not seem to help. Her nephrologist did mention that she could try 1/2 tab of 30 mg Cardizem prior to starting dialysis.I did talk to Dr. Rayann Heman and he is willing to consider primary ablation. She does have normal heart function by echo in 2017.   She saw Dr.Allred early this year and was offered ablation but she declined. 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 Now that she is post op several weeks, her BP's are running low. When she saw the kidney surgeon a few days ago, he mentioned for her to come here and get  her meds adjusted. During this time, she has not noted any afib, it has been quiet.  F/u afib clinic 5/9, on lat visit , metoprolol was adjusted as  well as Cardizem. She reports no afib and BP has been running much better, not so low.  Feels improved. She is currently happy with management.  F/u in afib clinic, 8/8, she  unfortunately was diagnosed in June of this year with rt breast CA and is undergoing chemotherapy. She will be reassessed after chemo  for next step but pt thinks she will have a double mastectomy. She has not had any issues with afib.  Today, she denies symptoms of palpitations, chest pain, shortness of breath, orthopnea, PND, lower extremity edema, dizziness, presyncope, syncope, or neurologic sequela. The patient is tolerating medications without difficulties and is otherwise without complaint today.   Past Medical History:  Diagnosis Date  . Anemia   . Arthritis    knees  . Chest pain Jan 2016   low risk Myoview   . Complication of anesthesia 2005   difficulty remembering for a while  . Constipation   . ESRD on dialysis [facility]) April 2016   MWF  . GERD (gastroesophageal reflux disease)   . Heart murmur Nov 2015   Aortic scleosis- no stenosis  . Hemodialysis patient ([place])   . Hypertension   . Shortness of breath dyspnea    with exertion   Past Surgical History:  Procedure Laterality Date  . ABDOMINAL HYSTERECTOMY  2005  . AV FISTULA PLACEMENT Left 12/09/2013   Procedure: INSERTION OF ARTERIOVENOUS (AV) GORE-TEX GRAFT ARM;  Surgeon: Elam Dutch, MD;  Location: [place];  Service: Vascular;  Laterality: Left;  . BREAST BIOPSY    .  BUNIONECTOMY Bilateral   . CHOLECYSTECTOMY  2005  . COLONOSCOPY    . IR GENERIC HISTORICAL  01/11/2016   IR US GUIDE VASC ACCESS RIGHT 01/11/2016 Corrie Mckusick, DO [facility]-INTERV [facility]  . IR GENERIC HISTORICAL  01/11/2016   IR RADIOLOGY PERIPHERAL GUIDED IV START 01/11/2016 Corrie Mckusick, DO [facility]  . IR IMAGING GUIDED PORT INSERTION  07/05/2017  . 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 KNEE ARTHROSCOPY Bilateral   . PERIPHERAL VASCULAR CATHETERIZATION N/A 08/31/2015   Procedure: A/V Shuntogram;  Surgeon: Serafina Mitchell, MD;  Location: [place] CV LAB;  Service: Cardiovascular;  Laterality: N/A;  . PERIPHERAL VASCULAR CATHETERIZATION Left  08/31/2015   Procedure: Peripheral Vascular Balloon Angioplasty;  Surgeon: Serafina Mitchell, MD;  Location: [place] CV LAB;  Service: Cardiovascular;  Laterality: Left;  arm fistula  . ROTATOR CUFF REPAIR Right 1997    Current Outpatient Medications  Medication Sig Dispense Refill  . acetaminophen (TYLENOL) 500 MG tablet Take 1,000 mg by mouth daily as needed for mild pain, moderate pain or headache.     Marland Kitchen amoxicillin (AMOXIL) 500 MG capsule Take 2,000 mg by mouth See admin instructions. Take 4 capsules (2000 mg) by mouth one hour prior to dental procedures    . aspirin EC 81 MG tablet Take 81 mg by mouth daily.    Marland Kitchen diltiazem (CARDIZEM) 30 MG tablet Take 1/2 tablet the morning of dialysis (Patient taking differently: Take 15 mg by mouth See admin instructions. Take 1/2 tablet the morning of dialysis) 45 tablet 1  . lidocaine-prilocaine (EMLA) cream Apply 1 application topically See admin instructions. Apply topically Monday, Wednesday and Friday before dialysis  6  . multivitamin (RENA-VIT) TABS tablet Take 1 tablet by mouth daily.    . ondansetron (ZOFRAN) 8 MG tablet Take 1 tablet (8 mg total) by mouth 2 (two) times daily as needed (Nausea or vomiting). 30 tablet 1  . pantoprazole (PROTONIX) 40 MG tablet TAKE 1 TABLET (40 MG TOTAL) BY MOUTH DAILY. 90 tablet 1  . prochlorperazine (COMPAZINE) 10 MG tablet Take 1 tablet (10 mg total) by mouth every 6 (six) hours as needed (Nausea or vomiting). 30 tablet 1  . sevelamer carbonate (RENVELA) 800 MG tablet Take 800-2,400 mg by mouth See admin instructions. 2,400mg  three times daily and 800mg  twice daily with snacks    . simvastatin (ZOCOR) 20 MG tablet Take 1 tablet (20 mg total) by mouth at bedtime. 30 tablet 11  . Darbepoetin Alfa-Albumin (ARANESP IJ) Inject as directed every 30 (thirty) days.     No current facility-administered medications for this encounter.     No Known Allergies  Social History   Socioeconomic History  . Marital  status: Married    Spouse name: Not on file  . Number of children: 2  . Years of education: Not on file  . Highest education level: Not on file  Occupational History  . Occupation: [employer] OFFICE    Employer: [employer]  Social Needs  . Financial resource strain: Not on file  . Food insecurity:    Worry: Not on file    Inability: Not on file  . Transportation needs:    Medical: Not on file    Non-medical: Not on file  Tobacco Use  . Smoking status: Never Smoker  . Smokeless tobacco: Never Used  Substance and Sexual Activity  . Alcohol use: No  . Drug use: No  . Sexual activity: Not on file  Comment: Hysterectomy  Lifestyle  . Physical activity:    Days per week: Not on file    Minutes per session: Not on file  . Stress: Not on file  Relationships  . Social connections:    Talks on phone: Not on file    Gets together: Not on file    Attends religious service: Not on file    Active member of club or organization: Not on file    Attends meetings of clubs or organizations: Not on file    Relationship status: Not on file  . Intimate partner violence:    Fear of current or ex partner: Not on file    Emotionally abused: Not on file    Physically abused: Not on file    Forced sexual activity: Not on file  Other Topics Concern  . Not on file  Social History Narrative  . Not on file    Family History  Problem Relation Age of Onset  . Kidney disease Mother   . Heart attack Father   . Kidney disease Father   . Diabetes Sister   . Hyperlipidemia Sister   . Hypertension Sister   . Kidney disease Sister        x2    ROS- All systems are reviewed and negative except as per the HPI above  Physical Exam: Vitals:   08/23/17 0909  BP: 116/68  Pulse: 92  Weight: 86.2 kg  Height: 5\' 1"  (1.549 m)   Wt Readings from Last 3 Encounters:  08/23/17 86.2 kg  08/14/17 86.3 kg  08/07/17 86.6 kg    Labs: Lab Results  Component Value Date   NA 143 08/21/2017    K 4.3 08/21/2017   CL 99 08/21/2017   CO2 31 08/21/2017   GLUCOSE 107 (H) 08/21/2017   BUN 27 (H) 08/21/2017   CREATININE 6.68 (HH) 08/21/2017   CALCIUM 8.8 (L) 08/21/2017   PHOS 4.6 12/17/2014   MG 2.1 11/24/2015   Lab Results  Component Value Date   INR 1.00 07/05/2017   Lab Results  Component Value Date   CHOL 165 06/08/2015   HDL 59 06/08/2015   LDLCALC 97 06/08/2015   TRIG 44 06/08/2015     GEN- The patient is well appearing, alert and oriented x 3 today.   Head- normocephalic, atraumatic Eyes-  Sclera clear, conjunctiva pink Ears- hearing intact Oropharynx- clear Neck- supple, no JVP Lymph- no cervical lymphadenopathy Lungs- Clear to ausculation bilaterally, normal work of breathing Heart- Regular rate and rhythm, no murmurs, rubs or gallops, PMI not laterally displaced GI- soft, NT, ND, + BS Extremities- no clubbing, cyanosis, or edema MS- no significant deformity or atrophy Skin- no rash or lesion Psych- euthymic mood, full affect Neuro- strength and sensation are intact  EKG- NSR at 92 bpm, pr int 136 ms, qrs int 82 ms, qtc 460 ms Epic records reviewed Echo- 06/2017-Study Conclusions  - Left ventricle: The cavity size was normal. Systolic function was   normal. The estimated ejection fraction was in the range of 60%   to 65%. Wall motion was normal; there were no regional wall   motion abnormalities. Doppler parameters are consistent with   abnormal left ventricular relaxation (grade 1 diastolic   dysfunction). Doppler parameters are consistent with   indeterminate ventricular filling pressure. - Aortic valve: There was no regurgitation. - Mitral valve: Transvalvular velocity was within the normal range.   There was no evidence for stenosis. There was no regurgitation. - Left  atrium: The atrium was mildly dilated. - Right ventricle: The cavity size was normal. Wall thickness was   normal. Systolic function was normal. - Tricuspid valve: There was mild  regurgitation. - Pulmonary arteries: Systolic pressure was within the normal   range. PA peak pressure: 25 mm Hg (S). - Global longitudinal strain -21.7% (normal).    Assessment and Plan: 1. Paroxysmal afib/? flutter associated with dialysis No afib to report She would only be a candidate for amiodarone as far as AAD's 2/2 ESRD, but am concerned re side effect profile with her relatively young age. Dr. Rayann Heman seen in evaluation and offered ablation, but she declined  She would have to be started on  anticoagulation  prior to procedure For now continue with ASA for chadsvasc score of 1(female)  2. Hypotension Pt with  hypertension with admission at [facility] 04/2017 with transplanted kidney rejection and removal with increase of meds Now post op with hypotention which is more pt's usual, especially  following dialysis  Continue  1/2 of 30 mg  Cardizem on days of dialysis Off BB BP stable today at 116/88  3. Newly diagnosed breast CA Per oncology   Has f/u with Dr. Gwenlyn Found in August but this is on a Tuesday which is a conflict with her chemo Will send messaged to his office to reschedule on a Thursday  Otherwise see her for f/u in 6 months    Butch Penny C. Adeja Sarratt, [facility] [address] [place], [place] [address] [phone]

## 2017-08-25 ENCOUNTER — Inpatient Hospital Stay: Payer: Medicare Other

## 2017-08-25 VITALS — BP 122/71 | HR 84 | Temp 98.4°F | Resp 16

## 2017-08-25 DIAGNOSIS — Z5111 Encounter for antineoplastic chemotherapy: Secondary | ICD-10-CM | POA: Diagnosis not present

## 2017-08-25 DIAGNOSIS — Z95828 Presence of other vascular implants and grafts: Secondary | ICD-10-CM

## 2017-08-25 MED ORDER — TBO-FILGRASTIM 480 MCG/0.8ML ~~LOC~~ SOSY
PREFILLED_SYRINGE | SUBCUTANEOUS | Status: AC
  Filled 2017-08-25: qty 0.8

## 2017-08-25 MED ORDER — TBO-FILGRASTIM 480 MCG/0.8ML ~~LOC~~ SOSY
480.0000 ug | PREFILLED_SYRINGE | Freq: Once | SUBCUTANEOUS | Status: AC
  Administered 2017-08-25: 480 ug via SUBCUTANEOUS

## 2017-08-25 NOTE — Patient Instructions (Signed)
Tbo-Filgrastim injection What is this medicine? TBO-FILGRASTIM (T B O fil GRA stim) is a granulocyte colony-stimulating factor that stimulates the growth of neutrophils, a type of white blood cell important in the body's fight against infection. It is used to reduce the incidence of fever and infection in patients with certain types of cancer who are receiving chemotherapy that affects the bone marrow. This medicine may be used for other purposes; ask your health care provider or pharmacist if you have questions. COMMON BRAND NAME(S): Granix What should I tell my health care provider before I take this medicine? They need to know if you have any of these conditions: -bone scan or tests planned -kidney disease -sickle cell anemia -an unusual or allergic reaction to tbo-filgrastim, filgrastim, pegfilgrastim, other medicines, foods, dyes, or preservatives -pregnant or trying to get pregnant -breast-feeding How should I use this medicine? This medicine is for injection under the skin. If you get this medicine at home, you will be taught how to prepare and give this medicine. Refer to the Instructions for Use that come with your medication packaging. Use exactly as directed. Take your medicine at regular intervals. Do not take your medicine more often than directed. It is important that you put your used needles and syringes in a special sharps container. Do not put them in a trash can. If you do not have a sharps container, call your pharmacist or healthcare provider to get one. Talk to your pediatrician regarding the use of this medicine in children. Special care may be needed. Overdosage: If you think you have taken too much of this medicine contact a poison control center or emergency room at once. NOTE: This medicine is only for you. Do not share this medicine with others. What if I miss a dose? It is important not to miss your dose. Call your doctor or health care professional if you miss a  dose. What may interact with this medicine? This medicine may interact with the following medications: -medicines that may cause a release of neutrophils, such as lithium This list may not describe all possible interactions. Give your health care provider a list of all the medicines, herbs, non-prescription drugs, or dietary supplements you use. Also tell them if you smoke, drink alcohol, or use illegal drugs. Some items may interact with your medicine. What should I watch for while using this medicine? You may need blood work done while you are taking this medicine. What side effects may I notice from receiving this medicine? Side effects that you should report to your doctor or health care professional as soon as possible: -allergic reactions like skin rash, itching or hives, swelling of the face, lips, or tongue -blood in the urine -dark urine -dizziness -fast heartbeat -feeling faint -shortness of breath or breathing problems -signs and symptoms of infection like fever or chills; cough; or sore throat -signs and symptoms of kidney injury like trouble passing urine or change in the amount of urine -stomach or side pain, or pain at the shoulder -sweating -swelling of the legs, ankles, or abdomen -tiredness Side effects that usually do not require medical attention (report to your doctor or health care professional if they continue or are bothersome): -bone pain -headache -muscle pain -vomiting This list may not describe all possible side effects. Call your doctor for medical advice about side effects. You may report side effects to FDA at 1-800-FDA-1088. Where should I keep my medicine? Keep out of the reach of children. Store in a refrigerator between   2 and 8 degrees C (36 and 46 degrees F). Keep in carton to protect from light. Throw away this medicine if it is left out of the refrigerator for more than 5 consecutive days. Throw away any unused medicine after the expiration  date. NOTE: This sheet is a summary. It may not cover all possible information. If you have questions about this medicine, talk to your doctor, pharmacist, or health care provider.  2018 Elsevier/Gold Standard (2015-02-22 19:07:04)  

## 2017-08-28 ENCOUNTER — Inpatient Hospital Stay: Payer: Medicare Other

## 2017-08-28 ENCOUNTER — Other Ambulatory Visit: Payer: Self-pay

## 2017-08-28 ENCOUNTER — Inpatient Hospital Stay (HOSPITAL_BASED_OUTPATIENT_CLINIC_OR_DEPARTMENT_OTHER): Payer: Medicare Other | Admitting: Hematology and Oncology

## 2017-08-28 ENCOUNTER — Telehealth: Payer: Self-pay | Admitting: Hematology and Oncology

## 2017-08-28 DIAGNOSIS — Z5111 Encounter for antineoplastic chemotherapy: Secondary | ICD-10-CM | POA: Diagnosis not present

## 2017-08-28 DIAGNOSIS — Z95828 Presence of other vascular implants and grafts: Secondary | ICD-10-CM

## 2017-08-28 DIAGNOSIS — Z171 Estrogen receptor negative status [ER-]: Principal | ICD-10-CM

## 2017-08-28 DIAGNOSIS — C50411 Malignant neoplasm of upper-outer quadrant of right female breast: Secondary | ICD-10-CM | POA: Diagnosis not present

## 2017-08-28 DIAGNOSIS — Z992 Dependence on renal dialysis: Secondary | ICD-10-CM

## 2017-08-28 DIAGNOSIS — I129 Hypertensive chronic kidney disease with stage 1 through stage 4 chronic kidney disease, or unspecified chronic kidney disease: Secondary | ICD-10-CM | POA: Diagnosis not present

## 2017-08-28 DIAGNOSIS — D6481 Anemia due to antineoplastic chemotherapy: Secondary | ICD-10-CM

## 2017-08-28 DIAGNOSIS — N189 Chronic kidney disease, unspecified: Secondary | ICD-10-CM

## 2017-08-28 DIAGNOSIS — Z79899 Other long term (current) drug therapy: Secondary | ICD-10-CM

## 2017-08-28 DIAGNOSIS — D61811 Other drug-induced pancytopenia: Secondary | ICD-10-CM

## 2017-08-28 DIAGNOSIS — D631 Anemia in chronic kidney disease: Secondary | ICD-10-CM

## 2017-08-28 LAB — CMP (CANCER CENTER ONLY)
ALT: 12 U/L (ref 0–44)
AST: 15 U/L (ref 15–41)
Albumin: 3.4 g/dL — ABNORMAL LOW (ref 3.5–5.0)
Alkaline Phosphatase: 159 U/L — ABNORMAL HIGH (ref 38–126)
Anion gap: 14 (ref 5–15)
BILIRUBIN TOTAL: 0.4 mg/dL (ref 0.3–1.2)
BUN: 25 mg/dL — ABNORMAL HIGH (ref 8–23)
CO2: 29 mmol/L (ref 22–32)
CREATININE: 5.96 mg/dL — AB (ref 0.44–1.00)
Calcium: 8.6 mg/dL — ABNORMAL LOW (ref 8.9–10.3)
Chloride: 100 mmol/L (ref 98–111)
GFR, EST AFRICAN AMERICAN: 8 mL/min — AB (ref >60)
GFR, EST NON AFRICAN AMERICAN: 7 mL/min — AB (ref >60)
Glucose, Bld: 94 mg/dL (ref 70–99)
POTASSIUM: 4.3 mmol/L (ref 3.5–5.1)
Sodium: 143 mmol/L (ref 135–145)
TOTAL PROTEIN: 6.4 g/dL — AB (ref 6.5–8.1)

## 2017-08-28 LAB — CBC WITH DIFFERENTIAL (CANCER CENTER ONLY)
BASOS ABS: 0.1 10*3/uL (ref 0.0–0.1)
Basophils Relative: 1 %
EOS ABS: 0.1 10*3/uL (ref 0.0–0.5)
EOS PCT: 2 %
HCT: 21.8 % — ABNORMAL LOW (ref 34.8–46.6)
Hemoglobin: 7.3 g/dL — ABNORMAL LOW (ref 11.6–15.9)
Lymphocytes Relative: 13 %
Lymphs Abs: 0.7 10*3/uL — ABNORMAL LOW (ref 0.9–3.3)
MCH: 33.4 pg (ref 25.1–34.0)
MCHC: 33.6 g/dL (ref 31.5–36.0)
MCV: 99.4 fL (ref 79.5–101.0)
Monocytes Absolute: 0.4 10*3/uL (ref 0.1–0.9)
Monocytes Relative: 7 %
Neutro Abs: 4.2 10*3/uL (ref 1.5–6.5)
Neutrophils Relative %: 77 %
PLATELETS: 220 10*3/uL (ref 145–400)
RBC: 2.2 MIL/uL — AB (ref 3.70–5.45)
RDW: 19.7 % — ABNORMAL HIGH (ref 11.2–14.5)
WBC: 5.5 10*3/uL (ref 3.9–10.3)

## 2017-08-28 MED ORDER — SODIUM CHLORIDE 0.9% FLUSH
10.0000 mL | INTRAVENOUS | Status: DC | PRN
  Administered 2017-08-28: 10 mL
  Filled 2017-08-28: qty 10

## 2017-08-28 MED ORDER — FAMOTIDINE IN NACL 20-0.9 MG/50ML-% IV SOLN
20.0000 mg | Freq: Once | INTRAVENOUS | Status: AC
  Administered 2017-08-28: 20 mg via INTRAVENOUS

## 2017-08-28 MED ORDER — ACETAMINOPHEN 325 MG PO TABS
ORAL_TABLET | ORAL | Status: AC
  Filled 2017-08-28: qty 2

## 2017-08-28 MED ORDER — DIPHENHYDRAMINE HCL 25 MG PO CAPS
ORAL_CAPSULE | ORAL | Status: AC
  Filled 2017-08-28: qty 1

## 2017-08-28 MED ORDER — DIPHENHYDRAMINE HCL 50 MG/ML IJ SOLN
INTRAMUSCULAR | Status: AC
  Filled 2017-08-28: qty 1

## 2017-08-28 MED ORDER — DIPHENHYDRAMINE HCL 50 MG/ML IJ SOLN
25.0000 mg | Freq: Once | INTRAMUSCULAR | Status: AC
Start: 2017-08-28 — End: 2017-08-28
  Administered 2017-08-28: 25 mg via INTRAVENOUS

## 2017-08-28 MED ORDER — ACETAMINOPHEN 325 MG PO TABS
650.0000 mg | ORAL_TABLET | Freq: Once | ORAL | Status: AC
  Administered 2017-08-28: 650 mg via ORAL

## 2017-08-28 MED ORDER — DEXAMETHASONE SODIUM PHOSPHATE 10 MG/ML IJ SOLN
INTRAMUSCULAR | Status: AC
  Filled 2017-08-28: qty 1

## 2017-08-28 MED ORDER — FAMOTIDINE IN NACL 20-0.9 MG/50ML-% IV SOLN
INTRAVENOUS | Status: AC
  Filled 2017-08-28: qty 50

## 2017-08-28 MED ORDER — DEXAMETHASONE SODIUM PHOSPHATE 10 MG/ML IJ SOLN
10.0000 mg | Freq: Once | INTRAMUSCULAR | Status: AC
  Administered 2017-08-28: 10 mg via INTRAVENOUS

## 2017-08-28 MED ORDER — SODIUM CHLORIDE 0.9 % IV SOLN
Freq: Once | INTRAVENOUS | Status: AC
  Administered 2017-08-28: 10:00:00 via INTRAVENOUS
  Filled 2017-08-28: qty 250

## 2017-08-28 MED ORDER — TRASTUZUMAB CHEMO 150 MG IV SOLR
2.0000 mg/kg | Freq: Once | INTRAVENOUS | Status: AC
  Administered 2017-08-28: 168 mg via INTRAVENOUS
  Filled 2017-08-28: qty 8

## 2017-08-28 MED ORDER — HEPARIN SOD (PORK) LOCK FLUSH 100 UNIT/ML IV SOLN
500.0000 [IU] | Freq: Once | INTRAVENOUS | Status: AC | PRN
  Administered 2017-08-28: 500 [IU]
  Filled 2017-08-28: qty 5

## 2017-08-28 MED ORDER — SODIUM CHLORIDE 0.9 % IV SOLN
60.0000 mg/m2 | Freq: Once | INTRAVENOUS | Status: AC
  Administered 2017-08-28: 114 mg via INTRAVENOUS
  Filled 2017-08-28: qty 19

## 2017-08-28 NOTE — Telephone Encounter (Signed)
Gave patient avs report and appointments for August and September. Per 8/13 los patient needs two units blood Saturday. Patient will have lab for type & cross on Thursday due to lab cannot be drawn on Saturday prior to blood and patient has dialysis on Friday.

## 2017-08-28 NOTE — Progress Notes (Signed)
Patient Care Team: Lucianne Lei, MD as PCP - General (Family Medicine) Lorretta Harp, MD as PCP - Cardiology (Cardiology) Donato Heinz, MD (Nephrology) Lorretta Harp, MD as Consulting Physician (Cardiology) Pyrtle, Lajuan Lines, MD as Consulting Physician (Gastroenterology)  DIAGNOSIS:  Encounter Diagnosis  Name Primary?  . Malignant neoplasm of upper-outer quadrant of right breast in female, estrogen receptor negative ([place])     SUMMARY OF ONCOLOGIC HISTORY:   Malignant neoplasm of upper-outer quadrant of right breast in female, estrogen receptor negative ([place])   06/19/2017 Initial Diagnosis    Right breast skin thickening and heaviness: Screening mammogram detected 2 cm of microcalcifications in the right breast in the posterior third UOQ.?  Skin involvement, stereotactic biopsy revealed IDC grade 2-3 with high-grade DCIS, lymphovascular invasion present, ER 0%, PR 0%, HER-2 positive ratio 8.69, gene copy #11.3 T1CN0 stage Ia    07/02/2017 Cancer Staging    Staging form: Breast, AJCC 8th Edition - Clinical: Stage IA (cT1c, cN0, cM0, G3, ER-, PR-, HER2+) - Signed by Nicholas Lose, MD on 07/02/2017    07/17/2017 -  Chemotherapy    Taxol Herceptin weekly x12 followed by Herceptin maintenance x1 year     CHIEF COMPLIANT: Cycle 6 Taxol Herceptin  INTERVAL HISTORY: Karen [name] is a 62 year old with above-mentioned history of right breast cancer currently on adjuvant chemotherapy with weekly Taxol Herceptin.  She gets hemodialysis 3 times a week.  She reports that she has been feeling extremely tired and she has been sleeping quite a lot through the weekend.  She had one episode of nausea.  She also had diarrhea.  REVIEW OF SYSTEMS:   Constitutional: Denies fevers, chills or abnormal weight loss, complains of severe fatigue Eyes: Denies blurriness of vision Ears, nose, mouth, throat, and face: Denies mucositis or sore throat Respiratory: Denies cough, dyspnea or  wheezes Cardiovascular: Denies palpitation, chest discomfort Gastrointestinal:  nausea and diarrhea Skin: Denies abnormal skin rashes Lymphatics: Denies new lymphadenopathy or easy bruising Neurological:Denies numbness, tingling or new weaknesses Behavioral/Psych: Mood is stable, no new changes  Extremities: No lower extremity edema   All other systems were reviewed with the patient and are negative.  I have reviewed the past medical history, past surgical history, social history and family history with the patient and they are unchanged from previous note.  ALLERGIES:  has No Known Allergies.  MEDICATIONS:  Current Outpatient Medications  Medication Sig Dispense Refill  . acetaminophen (TYLENOL) 500 MG tablet Take 1,000 mg by mouth daily as needed for mild pain, moderate pain or headache.     Marland Kitchen amoxicillin (AMOXIL) 500 MG capsule Take 2,000 mg by mouth See admin instructions. Take 4 capsules (2000 mg) by mouth one hour prior to dental procedures    . aspirin EC 81 MG tablet Take 81 mg by mouth daily.    . Darbepoetin Alfa-Albumin (ARANESP IJ) Inject as directed every 30 (thirty) days.    Marland Kitchen diltiazem (CARDIZEM) 30 MG tablet Take 1/2 tablet the morning of dialysis (Patient taking differently: Take 15 mg by mouth See admin instructions. Take 1/2 tablet the morning of dialysis) 45 tablet 1  . lidocaine-prilocaine (EMLA) cream Apply 1 application topically See admin instructions. Apply topically Monday, Wednesday and Friday before dialysis  6  . multivitamin (RENA-VIT) TABS tablet Take 1 tablet by mouth daily.    . ondansetron (ZOFRAN) 8 MG tablet Take 1 tablet (8 mg total) by mouth 2 (two) times daily as needed (Nausea or vomiting). 30 tablet 1  .  pantoprazole (PROTONIX) 40 MG tablet TAKE 1 TABLET (40 MG TOTAL) BY MOUTH DAILY. 90 tablet 1  . prochlorperazine (COMPAZINE) 10 MG tablet Take 1 tablet (10 mg total) by mouth every 6 (six) hours as needed (Nausea or vomiting). 30 tablet 1  .  sevelamer carbonate (RENVELA) 800 MG tablet Take 800-2,400 mg by mouth See admin instructions. 2,'400mg'$  three times daily and '800mg'$  twice daily with snacks    . simvastatin (ZOCOR) 20 MG tablet Take 1 tablet (20 mg total) by mouth at bedtime. 30 tablet 11   No current facility-administered medications for this visit.     PHYSICAL EXAMINATION: ECOG PERFORMANCE STATUS: 2 - Symptomatic, <50% confined to bed  Vitals:   08/28/17 0838  BP: 125/68  Pulse: 80  Resp: 17  Temp: 97.9 F (36.6 C)  SpO2: 100%   Filed Weights   08/28/17 0838  Weight: 192 lb 8 oz (87.3 kg)    GENERAL:alert, no distress and comfortable SKIN: skin color, texture, turgor are normal, no rashes or significant lesions EYES: normal, Conjunctiva are pink and non-injected, sclera clear OROPHARYNX:no exudate, no erythema and lips, buccal mucosa, and tongue normal  NECK: supple, thyroid normal size, non-tender, without nodularity LYMPH:  no palpable lymphadenopathy in the cervical, axillary or inguinal LUNGS: clear to auscultation and percussion with normal breathing effort HEART: regular rate & rhythm and no murmurs and no lower extremity edema ABDOMEN:abdomen soft, non-tender and normal bowel sounds MUSCULOSKELETAL:no cyanosis of digits and no clubbing  NEURO: alert & oriented x 3 with fluent speech, no focal motor/sensory deficits EXTREMITIES: No lower extremity edema   LABORATORY DATA:  I have reviewed the data as listed CMP Latest Ref Rng & Units 08/21/2017 08/14/2017 08/07/2017  Glucose 70 - 99 mg/dL 107(H) 119(H) 102(H)  BUN 8 - 23 mg/dL 27(H) 37(H) 30(H)  Creatinine 0.44 - 1.00 mg/dL 6.68(HH) 6.59(HH) 6.44(HH)  Sodium 135 - 145 mmol/L 143 145 144  Potassium 3.5 - 5.1 mmol/L 4.3 4.3 4.3  Chloride 98 - 111 mmol/L 99 100 101  CO2 22 - 32 mmol/L 31 33(H) 30  Calcium 8.9 - 10.3 mg/dL 8.8(L) 9.0 9.4  Total Protein 6.5 - 8.1 g/dL 6.7 6.8 7.0  Total Bilirubin 0.3 - 1.2 mg/dL 0.4 0.4 0.4  Alkaline Phos 38 - 126 U/L  170(H) 168(H) 165(H)  AST 15 - 41 U/L 15 13(L) 16  ALT 0 - 44 U/L '14 10 11    '$ Lab Results  Component Value Date   WBC 5.5 08/28/2017   HGB 7.3 (L) 08/28/2017   HCT 21.8 (L) 08/28/2017   MCV 99.4 08/28/2017   PLT 220 08/28/2017   NEUTROABS 4.2 08/28/2017    ASSESSMENT & PLAN:  Malignant neoplasm of upper-outer quadrant of right breast in female, estrogen receptor negative ([place]) 06/19/2017: Right breast skin thickening and heaviness: Screening mammogram detected 2 cm of microcalcifications in the right breast in the posterior third UOQ.? 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  2. neoadjuvant chemotherapy with Taxol Herceptin weekly x12 followed by Herceptin maintenance for 1 year 3. Followed by surgery with either lumpectomy or mastectomy depending on the MRI findings and treatment response 4. Followed by adjuvant radiation if she undergoes breast conserving surgery 5. Followed by adjuvant antiestrogen therapy  -------------------------------------------------------------------------------- Current treatment: Cycle6day 1 Taxol Herceptin (with Granix support) Labs have been reviewed  Chemo  Toxicities: 1.Neutropenia: With Granix being given on the Saturday prior to treatment, counts are remaining stable 2.mild fatigue 3.Chemotherapy-induced anemia on top of anemia due to chronic kidney disease being monitored closely, today's hemoglobin is 7.3 I recommended that the patient get 2 units of packed red cells.  She can come on Saturday when she receives her Neupogen injection to receive the blood transfusion at the same time.  Return to clinic weekly for chemotherapy and every other week for follow-up with me  No orders of the defined types were placed in this encounter.  The patient has  a good understanding of the overall plan. she agrees with it. she will call with any problems that may develop before the next visit here.   Harriette Ohara, MD 08/28/17

## 2017-08-28 NOTE — Patient Instructions (Signed)
[facility] Discharge Instructions for Patients Receiving Chemotherapy  Today you received the following chemotherapy agents :  Herceptin,  Taxol.  To help prevent nausea and vomiting after your treatment, we encourage you to take your nausea medication as prescribed.   If you develop nausea and vomiting that is not controlled by your nausea medication, call the clinic.   BELOW ARE SYMPTOMS THAT SHOULD BE REPORTED IMMEDIATELY:  *FEVER GREATER THAN 100.5 F  *CHILLS WITH OR WITHOUT FEVER  NAUSEA AND VOMITING THAT IS NOT CONTROLLED WITH YOUR NAUSEA MEDICATION  *UNUSUAL SHORTNESS OF BREATH  *UNUSUAL BRUISING OR BLEEDING  TENDERNESS IN MOUTH AND THROAT WITH OR WITHOUT PRESENCE OF ULCERS  *URINARY PROBLEMS  *BOWEL PROBLEMS  UNUSUAL RASH Items with * indicate a potential emergency and should be followed up as soon as possible.  Feel free to call the clinic should you have any questions or concerns. The clinic phone number is [phone].  Please show the [place] at check-in to the Emergency Department and triage nurse.

## 2017-08-28 NOTE — Assessment & Plan Note (Signed)
06/19/2017: Right breast skin thickening and heaviness: Screening mammogram detected 2 cm of microcalcifications in the right breast in the posterior third UOQ.? 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  2. neoadjuvant chemotherapy with Taxol Herceptin weekly x12 followed by Herceptin maintenance for 1 year 3. Followed by surgery with either lumpectomy or mastectomy depending on the MRI findings and treatment response 4. Followed by adjuvant radiation if she undergoes breast conserving surgery 5. Followed by adjuvant antiestrogen therapy  -------------------------------------------------------------------------------- Current treatment: Cycle6day 1 Taxol Herceptin (with Granix support) Labs have been reviewed  Chemo Toxicities: 1.Neutropenia: With Granix being given on the Saturday prior to treatment, counts are remaining stable 2.mild fatigue 3.Chemotherapy-induced anemia on top of anemia due to chronic kidney disease being monitored closely, today's hemoglobin is 8.8  Return to clinic weekly for chemotherapy and every other week for follow-up with me

## 2017-08-28 NOTE — Progress Notes (Signed)
Ok to treat per Dr.Gudena with CR 5.96

## 2017-08-28 NOTE — Progress Notes (Signed)
Per Dr.Gudena, Pt to receive 2 units of blood on Saturday 09/01/17. Placed orders under sign and held for Saturday.

## 2017-08-29 ENCOUNTER — Telehealth (HOSPITAL_COMMUNITY): Payer: Self-pay | Admitting: *Deleted

## 2017-08-29 ENCOUNTER — Telehealth: Payer: Self-pay

## 2017-08-29 NOTE — Telephone Encounter (Signed)
Patient needed to push her appointment back to later that same day. Per 8/14 Triage transferred call

## 2017-08-29 NOTE — Telephone Encounter (Signed)
Patient called in stating she is having issues with low BP during dialysis. She usually takes 15mg  of short acting diltiazem 3 hours prior to HD. Discussed with Roderic Palau NP - pt can try taking cardizem after HD if SBP is over 90. Pt verbalized understanding and will try this. Will call back if continued issues.

## 2017-08-29 NOTE — Telephone Encounter (Signed)
Returned patient's call.  Patient wanted to ensure Dr. Lindi [name] was aware that her graft for dialysis has clotted off twice this week.  Nurse encouraged patient to contact her dialysis clinic as well, voiced understanding and agreement.  Will forward to provider so he is aware per patient request.

## 2017-08-30 ENCOUNTER — Inpatient Hospital Stay: Payer: Medicare Other

## 2017-08-30 DIAGNOSIS — Z95828 Presence of other vascular implants and grafts: Secondary | ICD-10-CM

## 2017-08-30 DIAGNOSIS — D61811 Other drug-induced pancytopenia: Secondary | ICD-10-CM

## 2017-08-30 DIAGNOSIS — Z5111 Encounter for antineoplastic chemotherapy: Secondary | ICD-10-CM | POA: Diagnosis not present

## 2017-08-30 MED ORDER — SODIUM CHLORIDE 0.9% FLUSH
10.0000 mL | INTRAVENOUS | Status: DC | PRN
  Filled 2017-08-30: qty 10

## 2017-08-30 MED ORDER — HEPARIN SOD (PORK) LOCK FLUSH 100 UNIT/ML IV SOLN
500.0000 [IU] | Freq: Once | INTRAVENOUS | Status: DC | PRN
Start: 2017-08-30 — End: 2017-08-30
  Filled 2017-08-30: qty 5

## 2017-08-31 LAB — PREPARE RBC (CROSSMATCH)

## 2017-08-31 LAB — ABO/RH: ABO/RH(D): B POS

## 2017-09-01 ENCOUNTER — Inpatient Hospital Stay: Payer: Medicare Other

## 2017-09-01 ENCOUNTER — Other Ambulatory Visit: Payer: Self-pay | Admitting: *Deleted

## 2017-09-01 ENCOUNTER — Ambulatory Visit: Payer: Medicare Other

## 2017-09-01 VITALS — BP 117/69 | HR 77 | Temp 98.4°F | Resp 18

## 2017-09-01 DIAGNOSIS — D61811 Other drug-induced pancytopenia: Secondary | ICD-10-CM

## 2017-09-01 DIAGNOSIS — Z5111 Encounter for antineoplastic chemotherapy: Secondary | ICD-10-CM | POA: Diagnosis not present

## 2017-09-01 MED ORDER — ACETAMINOPHEN 325 MG PO TABS
ORAL_TABLET | ORAL | Status: AC
Start: 2017-09-01 — End: ?
  Filled 2017-09-01: qty 2

## 2017-09-01 MED ORDER — TBO-FILGRASTIM 480 MCG/0.8ML ~~LOC~~ SOSY
PREFILLED_SYRINGE | SUBCUTANEOUS | Status: AC
  Filled 2017-09-01: qty 0.8

## 2017-09-01 MED ORDER — SODIUM CHLORIDE 0.9% FLUSH
10.0000 mL | Freq: Once | INTRAVENOUS | Status: AC
  Administered 2017-09-01: 10 mL via INTRAVENOUS
  Filled 2017-09-01: qty 10

## 2017-09-01 MED ORDER — SODIUM CHLORIDE 0.9% IV SOLUTION
250.0000 mL | Freq: Once | INTRAVENOUS | Status: AC
  Administered 2017-09-01: 250 mL via INTRAVENOUS
  Filled 2017-09-01: qty 250

## 2017-09-01 MED ORDER — ACETAMINOPHEN 325 MG PO TABS
650.0000 mg | ORAL_TABLET | Freq: Once | ORAL | Status: AC
  Administered 2017-09-01: 650 mg via ORAL

## 2017-09-01 MED ORDER — DIPHENHYDRAMINE HCL 25 MG PO CAPS
ORAL_CAPSULE | ORAL | Status: AC
  Filled 2017-09-01: qty 1

## 2017-09-01 MED ORDER — DIPHENHYDRAMINE HCL 25 MG PO CAPS
25.0000 mg | ORAL_CAPSULE | Freq: Once | ORAL | Status: AC
  Administered 2017-09-01: 25 mg via ORAL

## 2017-09-01 MED ORDER — HEPARIN SOD (PORK) LOCK FLUSH 100 UNIT/ML IV SOLN
500.0000 [IU] | Freq: Every day | INTRAVENOUS | Status: AC | PRN
  Administered 2017-09-01: 500 [IU]
  Filled 2017-09-01: qty 5

## 2017-09-01 NOTE — Patient Instructions (Signed)

## 2017-09-03 LAB — BPAM RBC
BLOOD PRODUCT EXPIRATION DATE: 201909192359
Blood Product Expiration Date: 201909192359
ISSUE DATE / TIME: 201908170935
ISSUE DATE / TIME: 201908170935
UNIT TYPE AND RH: 7300
UNIT TYPE AND RH: 7300

## 2017-09-03 LAB — TYPE AND SCREEN
ABO/RH(D): B POS
ANTIBODY SCREEN: NEGATIVE
UNIT DIVISION: 0
Unit division: 0

## 2017-09-04 ENCOUNTER — Ambulatory Visit: Payer: Medicare Other | Admitting: Cardiovascular Disease

## 2017-09-04 ENCOUNTER — Inpatient Hospital Stay: Payer: Medicare Other

## 2017-09-04 VITALS — BP 122/71 | HR 72 | Temp 98.2°F | Resp 18 | Wt 190.0 lb

## 2017-09-04 DIAGNOSIS — Z171 Estrogen receptor negative status [ER-]: Principal | ICD-10-CM

## 2017-09-04 DIAGNOSIS — Z95828 Presence of other vascular implants and grafts: Secondary | ICD-10-CM

## 2017-09-04 DIAGNOSIS — Z5111 Encounter for antineoplastic chemotherapy: Secondary | ICD-10-CM | POA: Diagnosis not present

## 2017-09-04 DIAGNOSIS — C50411 Malignant neoplasm of upper-outer quadrant of right female breast: Secondary | ICD-10-CM

## 2017-09-04 LAB — CMP (CANCER CENTER ONLY)
ALBUMIN: 3.6 g/dL (ref 3.5–5.0)
ALK PHOS: 163 U/L — AB (ref 38–126)
ALT: 17 U/L (ref 0–44)
AST: 19 U/L (ref 15–41)
Anion gap: 12 (ref 5–15)
BILIRUBIN TOTAL: 0.4 mg/dL (ref 0.3–1.2)
BUN: 21 mg/dL (ref 8–23)
CALCIUM: 8.9 mg/dL (ref 8.9–10.3)
CO2: 29 mmol/L (ref 22–32)
CREATININE: 6.3 mg/dL — AB (ref 0.44–1.00)
Chloride: 102 mmol/L (ref 98–111)
GFR, Est AFR Am: 7 mL/min — ABNORMAL LOW (ref >60)
GFR, Estimated: 6 mL/min — ABNORMAL LOW (ref >60)
GLUCOSE: 92 mg/dL (ref 70–99)
Potassium: 4.4 mmol/L (ref 3.5–5.1)
Sodium: 143 mmol/L (ref 135–145)
TOTAL PROTEIN: 6.8 g/dL (ref 6.5–8.1)

## 2017-09-04 LAB — CBC WITH DIFFERENTIAL (CANCER CENTER ONLY)
BASOS ABS: 0.1 10*3/uL (ref 0.0–0.1)
BASOS PCT: 2 %
EOS ABS: 0.1 10*3/uL (ref 0.0–0.5)
EOS PCT: 5 %
HCT: 29.6 % — ABNORMAL LOW (ref 34.8–46.6)
Hemoglobin: 9.9 g/dL — ABNORMAL LOW (ref 11.6–15.9)
Lymphocytes Relative: 22 %
Lymphs Abs: 0.6 10*3/uL — ABNORMAL LOW (ref 0.9–3.3)
MCH: 33 pg (ref 25.1–34.0)
MCHC: 33.4 g/dL (ref 31.5–36.0)
MCV: 98.6 fL (ref 79.5–101.0)
MONO ABS: 0.2 10*3/uL (ref 0.1–0.9)
Monocytes Relative: 8 %
Neutro Abs: 1.7 10*3/uL (ref 1.5–6.5)
Neutrophils Relative %: 63 %
PLATELETS: 208 10*3/uL (ref 145–400)
RBC: 3 MIL/uL — ABNORMAL LOW (ref 3.70–5.45)
RDW: 17.1 % — AB (ref 11.2–14.5)
WBC Count: 2.7 10*3/uL — ABNORMAL LOW (ref 3.9–10.3)

## 2017-09-04 MED ORDER — SODIUM CHLORIDE 0.9 % IV SOLN
60.0000 mg/m2 | Freq: Once | INTRAVENOUS | Status: AC
  Administered 2017-09-04: 114 mg via INTRAVENOUS
  Filled 2017-09-04: qty 19

## 2017-09-04 MED ORDER — HEPARIN SOD (PORK) LOCK FLUSH 100 UNIT/ML IV SOLN
500.0000 [IU] | Freq: Once | INTRAVENOUS | Status: AC | PRN
  Administered 2017-09-04: 500 [IU]
  Filled 2017-09-04: qty 5

## 2017-09-04 MED ORDER — HEPARIN SOD (PORK) LOCK FLUSH 100 UNIT/ML IV SOLN
500.0000 [IU] | Freq: Once | INTRAVENOUS | Status: DC | PRN
  Filled 2017-09-04: qty 5

## 2017-09-04 MED ORDER — SODIUM CHLORIDE 0.9% FLUSH
10.0000 mL | INTRAVENOUS | Status: DC | PRN
  Administered 2017-09-04: 10 mL
  Filled 2017-09-04: qty 10

## 2017-09-04 MED ORDER — TRASTUZUMAB CHEMO 150 MG IV SOLR
2.0000 mg/kg | Freq: Once | INTRAVENOUS | Status: AC
  Administered 2017-09-04: 168 mg via INTRAVENOUS
  Filled 2017-09-04: qty 8

## 2017-09-04 MED ORDER — ACETAMINOPHEN 325 MG PO TABS
ORAL_TABLET | ORAL | Status: AC
  Filled 2017-09-04: qty 2

## 2017-09-04 MED ORDER — FAMOTIDINE IN NACL 20-0.9 MG/50ML-% IV SOLN
INTRAVENOUS | Status: AC
  Filled 2017-09-04: qty 50

## 2017-09-04 MED ORDER — DEXAMETHASONE SODIUM PHOSPHATE 10 MG/ML IJ SOLN
INTRAMUSCULAR | Status: AC
  Filled 2017-09-04: qty 1

## 2017-09-04 MED ORDER — SODIUM CHLORIDE 0.9 % IV SOLN
Freq: Once | INTRAVENOUS | Status: AC
  Administered 2017-09-04: 09:00:00 via INTRAVENOUS
  Filled 2017-09-04: qty 250

## 2017-09-04 MED ORDER — ACETAMINOPHEN 325 MG PO TABS
650.0000 mg | ORAL_TABLET | Freq: Once | ORAL | Status: AC
  Administered 2017-09-04: 650 mg via ORAL

## 2017-09-04 MED ORDER — DIPHENHYDRAMINE HCL 50 MG/ML IJ SOLN
INTRAMUSCULAR | Status: AC
  Filled 2017-09-04: qty 1

## 2017-09-04 MED ORDER — DEXAMETHASONE SODIUM PHOSPHATE 10 MG/ML IJ SOLN
10.0000 mg | Freq: Once | INTRAMUSCULAR | Status: AC
  Administered 2017-09-04: 10 mg via INTRAVENOUS

## 2017-09-04 MED ORDER — DIPHENHYDRAMINE HCL 25 MG PO CAPS
ORAL_CAPSULE | ORAL | Status: AC
  Filled 2017-09-04: qty 1

## 2017-09-04 MED ORDER — DIPHENHYDRAMINE HCL 50 MG/ML IJ SOLN
25.0000 mg | Freq: Once | INTRAMUSCULAR | Status: AC
  Administered 2017-09-04: 25 mg via INTRAVENOUS

## 2017-09-04 MED ORDER — FAMOTIDINE IN NACL 20-0.9 MG/50ML-% IV SOLN
20.0000 mg | Freq: Once | INTRAVENOUS | Status: AC
  Administered 2017-09-04: 20 mg via INTRAVENOUS

## 2017-09-04 NOTE — Progress Notes (Signed)
Ok to treat with chemo with CR 6.3 per Dr.GUdena

## 2017-09-04 NOTE — Patient Instructions (Signed)
[facility] Discharge Instructions for Patients Receiving Chemotherapy  Today you received the following chemotherapy agents Paclitaxel, Herceptin  To help prevent nausea and vomiting after your treatment, we encourage you to take your nausea medication as directed   If you develop nausea and vomiting that is not controlled by your nausea medication, call the clinic.   BELOW ARE SYMPTOMS THAT SHOULD BE REPORTED IMMEDIATELY:  *FEVER GREATER THAN 100.5 F  *CHILLS WITH OR WITHOUT FEVER  NAUSEA AND VOMITING THAT IS NOT CONTROLLED WITH YOUR NAUSEA MEDICATION  *UNUSUAL SHORTNESS OF BREATH  *UNUSUAL BRUISING OR BLEEDING  TENDERNESS IN MOUTH AND THROAT WITH OR WITHOUT PRESENCE OF ULCERS  *URINARY PROBLEMS  *BOWEL PROBLEMS  UNUSUAL RASH Items with * indicate a potential emergency and should be followed up as soon as possible.  Feel free to call the clinic should you have any questions or concerns. The clinic phone number is [phone].  Please show the [place] at check-in to the Emergency Department and triage nurse.

## 2017-09-04 NOTE — Progress Notes (Signed)
Per Dr. Lindi Adie, ok to treat with 09/04/17 CMP results.

## 2017-09-08 ENCOUNTER — Inpatient Hospital Stay: Payer: Medicare Other

## 2017-09-08 VITALS — BP 140/80 | HR 91 | Temp 98.3°F | Resp 16

## 2017-09-08 DIAGNOSIS — Z95828 Presence of other vascular implants and grafts: Secondary | ICD-10-CM

## 2017-09-08 DIAGNOSIS — Z5111 Encounter for antineoplastic chemotherapy: Secondary | ICD-10-CM | POA: Diagnosis not present

## 2017-09-08 MED ORDER — TBO-FILGRASTIM 480 MCG/0.8ML ~~LOC~~ SOSY
480.0000 ug | PREFILLED_SYRINGE | Freq: Once | SUBCUTANEOUS | Status: AC
  Administered 2017-09-08: 480 ug via SUBCUTANEOUS

## 2017-09-11 ENCOUNTER — Inpatient Hospital Stay (HOSPITAL_BASED_OUTPATIENT_CLINIC_OR_DEPARTMENT_OTHER): Payer: Medicare Other | Admitting: Hematology and Oncology

## 2017-09-11 ENCOUNTER — Inpatient Hospital Stay: Payer: Medicare Other

## 2017-09-11 DIAGNOSIS — Z171 Estrogen receptor negative status [ER-]: Principal | ICD-10-CM

## 2017-09-11 DIAGNOSIS — N189 Chronic kidney disease, unspecified: Secondary | ICD-10-CM

## 2017-09-11 DIAGNOSIS — I129 Hypertensive chronic kidney disease with stage 1 through stage 4 chronic kidney disease, or unspecified chronic kidney disease: Secondary | ICD-10-CM

## 2017-09-11 DIAGNOSIS — C50411 Malignant neoplasm of upper-outer quadrant of right female breast: Secondary | ICD-10-CM | POA: Diagnosis not present

## 2017-09-11 DIAGNOSIS — Z5111 Encounter for antineoplastic chemotherapy: Secondary | ICD-10-CM | POA: Diagnosis not present

## 2017-09-11 DIAGNOSIS — D6481 Anemia due to antineoplastic chemotherapy: Secondary | ICD-10-CM

## 2017-09-11 DIAGNOSIS — D631 Anemia in chronic kidney disease: Secondary | ICD-10-CM

## 2017-09-11 DIAGNOSIS — Z95828 Presence of other vascular implants and grafts: Secondary | ICD-10-CM

## 2017-09-11 DIAGNOSIS — Z79899 Other long term (current) drug therapy: Secondary | ICD-10-CM

## 2017-09-11 DIAGNOSIS — Z992 Dependence on renal dialysis: Secondary | ICD-10-CM

## 2017-09-11 LAB — CMP (CANCER CENTER ONLY)
ALT: 15 U/L (ref 0–44)
AST: 17 U/L (ref 15–41)
Albumin: 3.6 g/dL (ref 3.5–5.0)
Alkaline Phosphatase: 174 U/L — ABNORMAL HIGH (ref 38–126)
Anion gap: 12 (ref 5–15)
BUN: 24 mg/dL — ABNORMAL HIGH (ref 8–23)
CO2: 31 mmol/L (ref 22–32)
Calcium: 9.3 mg/dL (ref 8.9–10.3)
Chloride: 101 mmol/L (ref 98–111)
Creatinine: 6.22 mg/dL (ref 0.44–1.00)
GFR, Est AFR Am: 8 mL/min — ABNORMAL LOW
GFR, Estimated: 6 mL/min — ABNORMAL LOW
Glucose, Bld: 94 mg/dL (ref 70–99)
Potassium: 4.3 mmol/L (ref 3.5–5.1)
Sodium: 144 mmol/L (ref 135–145)
Total Bilirubin: 0.3 mg/dL (ref 0.3–1.2)
Total Protein: 6.6 g/dL (ref 6.5–8.1)

## 2017-09-11 LAB — CBC WITH DIFFERENTIAL (CANCER CENTER ONLY)
Basophils Absolute: 0.1 10*3/uL (ref 0.0–0.1)
Basophils Relative: 1 %
EOS ABS: 0.2 10*3/uL (ref 0.0–0.5)
EOS PCT: 3 %
HCT: 27.9 % — ABNORMAL LOW (ref 34.8–46.6)
Hemoglobin: 9.5 g/dL — ABNORMAL LOW (ref 11.6–15.9)
LYMPHS ABS: 0.9 10*3/uL (ref 0.9–3.3)
LYMPHS PCT: 14 %
MCH: 33.4 pg (ref 25.1–34.0)
MCHC: 33.9 g/dL (ref 31.5–36.0)
MCV: 98.6 fL (ref 79.5–101.0)
MONO ABS: 0.5 10*3/uL (ref 0.1–0.9)
MONOS PCT: 8 %
Neutro Abs: 4.6 10*3/uL (ref 1.5–6.5)
Neutrophils Relative %: 74 %
PLATELETS: 207 10*3/uL (ref 145–400)
RBC: 2.83 MIL/uL — ABNORMAL LOW (ref 3.70–5.45)
RDW: 17.2 % — ABNORMAL HIGH (ref 11.2–14.5)
WBC: 6.2 10*3/uL (ref 3.9–10.3)

## 2017-09-11 MED ORDER — ACETAMINOPHEN 325 MG PO TABS
ORAL_TABLET | ORAL | Status: AC
  Filled 2017-09-11: qty 2

## 2017-09-11 MED ORDER — DIPHENHYDRAMINE HCL 50 MG/ML IJ SOLN
25.0000 mg | Freq: Once | INTRAMUSCULAR | Status: AC
  Administered 2017-09-11: 25 mg via INTRAVENOUS

## 2017-09-11 MED ORDER — SODIUM CHLORIDE 0.9 % IV SOLN
60.0000 mg/m2 | Freq: Once | INTRAVENOUS | Status: AC
  Administered 2017-09-11: 114 mg via INTRAVENOUS
  Filled 2017-09-11: qty 19

## 2017-09-11 MED ORDER — SODIUM CHLORIDE 0.9 % IV SOLN
Freq: Once | INTRAVENOUS | Status: AC
  Administered 2017-09-11: 09:00:00 via INTRAVENOUS
  Filled 2017-09-11: qty 250

## 2017-09-11 MED ORDER — COLD PACK MISC ONCOLOGY
1.0000 | Freq: Once | Status: DC | PRN
  Filled 2017-09-11: qty 1

## 2017-09-11 MED ORDER — FAMOTIDINE IN NACL 20-0.9 MG/50ML-% IV SOLN
INTRAVENOUS | Status: AC
  Filled 2017-09-11: qty 50

## 2017-09-11 MED ORDER — FAMOTIDINE IN NACL 20-0.9 MG/50ML-% IV SOLN
20.0000 mg | Freq: Once | INTRAVENOUS | Status: AC
  Administered 2017-09-11: 20 mg via INTRAVENOUS

## 2017-09-11 MED ORDER — DIPHENHYDRAMINE HCL 50 MG/ML IJ SOLN
INTRAMUSCULAR | Status: AC
  Filled 2017-09-11: qty 1

## 2017-09-11 MED ORDER — SODIUM CHLORIDE 0.9% FLUSH
10.0000 mL | INTRAVENOUS | Status: DC | PRN
  Administered 2017-09-11: 10 mL
  Filled 2017-09-11: qty 10

## 2017-09-11 MED ORDER — HEPARIN SOD (PORK) LOCK FLUSH 100 UNIT/ML IV SOLN
500.0000 [IU] | Freq: Once | INTRAVENOUS | Status: AC | PRN
  Administered 2017-09-11: 500 [IU]
  Filled 2017-09-11: qty 5

## 2017-09-11 MED ORDER — DEXAMETHASONE SODIUM PHOSPHATE 10 MG/ML IJ SOLN
INTRAMUSCULAR | Status: AC
  Filled 2017-09-11: qty 1

## 2017-09-11 MED ORDER — ACETAMINOPHEN 325 MG PO TABS
650.0000 mg | ORAL_TABLET | Freq: Once | ORAL | Status: AC
  Administered 2017-09-11: 650 mg via ORAL

## 2017-09-11 MED ORDER — TRASTUZUMAB CHEMO 150 MG IV SOLR
2.0000 mg/kg | Freq: Once | INTRAVENOUS | Status: AC
  Administered 2017-09-11: 168 mg via INTRAVENOUS
  Filled 2017-09-11: qty 8

## 2017-09-11 MED ORDER — DEXAMETHASONE SODIUM PHOSPHATE 10 MG/ML IJ SOLN
10.0000 mg | Freq: Once | INTRAMUSCULAR | Status: AC
  Administered 2017-09-11: 10 mg via INTRAVENOUS

## 2017-09-11 NOTE — Assessment & Plan Note (Addendum)
06/19/2017: Right breast skin thickening and heaviness: Screening mammogram detected 2 cm of microcalcifications in the right breast in the posterior third UOQ.? 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 neoadjuvant chemotherapy with Taxol Herceptin weekly x12 followed by Herceptin maintenance for 1 year 2. Followed by surgery with either lumpectomy or mastectomy depending on the MRI findings and treatment response 3. Followed by adjuvant radiation if she undergoes breast conserving surgery 4. Followed by adjuvant antiestrogen therapy  -------------------------------------------------------------------------------- Current treatment: Cycle8day 1 Taxol Herceptin(with Granix support) Labs have been reviewed  Chemo Toxicities: 1.Neutropenia: With Granix being given on the Saturday prior to treatment, counts are remaining stable 2.mild fatigue 3.Chemotherapy-induced anemia on top of anemia due to chronic kidney disease being monitored closely, today's hemoglobin is  She received blood transfusion previously.  Return weekly for chemotherapy and every other week for follow-up with me 

## 2017-09-11 NOTE — Progress Notes (Signed)
Lab notified of critical level of creatinine, 6.22.  Dr. Lindi Adie aware, okay for treatment today with lab results.

## 2017-09-11 NOTE — Progress Notes (Signed)
Okay to treat with today's labs per Dr. Lindi Adie.

## 2017-09-11 NOTE — Patient Instructions (Signed)
[facility] Discharge Instructions for Patients Receiving Chemotherapy  Today you received the following chemotherapy agents Herceptin,Taxol  To help prevent nausea and vomiting after your treatment, we encourage you to take your nausea medication as directed   If you develop nausea and vomiting that is not controlled by your nausea medication, call the clinic.   BELOW ARE SYMPTOMS THAT SHOULD BE REPORTED IMMEDIATELY:  *FEVER GREATER THAN 100.5 F  *CHILLS WITH OR WITHOUT FEVER  NAUSEA AND VOMITING THAT IS NOT CONTROLLED WITH YOUR NAUSEA MEDICATION  *UNUSUAL SHORTNESS OF BREATH  *UNUSUAL BRUISING OR BLEEDING  TENDERNESS IN MOUTH AND THROAT WITH OR WITHOUT PRESENCE OF ULCERS  *URINARY PROBLEMS  *BOWEL PROBLEMS  UNUSUAL RASH Items with * indicate a potential emergency and should be followed up as soon as possible.  Feel free to call the clinic should you have any questions or concerns. The clinic phone number is [phone].  Please show the [place] at check-in to the Emergency Department and triage nurse.

## 2017-09-11 NOTE — Progress Notes (Signed)
Patient Care Team: Lucianne Lei, MD as PCP - General (Family Medicine) Lorretta Harp, MD as PCP - Cardiology (Cardiology) Donato Heinz, MD (Nephrology) Lorretta Harp, MD as Consulting Physician (Cardiology) Pyrtle, Lajuan Lines, MD as Consulting Physician (Gastroenterology)  DIAGNOSIS:  Encounter Diagnosis  Name Primary?  . Malignant neoplasm of upper-outer quadrant of right breast in female, estrogen receptor negative ([place])     SUMMARY OF ONCOLOGIC HISTORY:   Malignant neoplasm of upper-outer quadrant of right breast in female, estrogen receptor negative ([place])   06/19/2017 Initial Diagnosis    Right breast skin thickening and heaviness: Screening mammogram detected 2 cm of microcalcifications in the right breast in the posterior third UOQ.?  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  Today is cycle 8.  She reports fatigue related to chemotherapy but denies any neuropathy.  She does feel fatigued as result of dialysis as well.  Denies any nausea or vomiting.  She has occasional diarrhea.  She has to take Imodium for that.  REVIEW OF SYSTEMS:   Constitutional: Denies fevers, chills or abnormal weight loss Eyes: Denies blurriness of vision Ears, nose, mouth, throat, and face: Denies mucositis or sore throat Respiratory: Denies cough, dyspnea or  wheezes Cardiovascular: Denies palpitation, chest discomfort Gastrointestinal: Diarrhea for which she takes Imodium Skin: Denies abnormal skin rashes Lymphatics: Denies new lymphadenopathy or easy bruising Neurological:Denies numbness, tingling or new weaknesses Behavioral/Psych: Mood is stable, no new changes  Extremities: No lower extremity edema   All other systems were reviewed with the patient and are negative.  I have reviewed the past medical history, past surgical history, social history and family history with the patient and they are unchanged from previous note.  ALLERGIES:  has No Known Allergies.  MEDICATIONS:  Current Outpatient Medications  Medication Sig Dispense Refill  . acetaminophen (TYLENOL) 500 MG tablet Take 1,000 mg by mouth daily as needed for mild pain, moderate pain or headache.     Marland Kitchen amoxicillin (AMOXIL) 500 MG capsule Take 2,000 mg by mouth See admin instructions. Take 4 capsules (2000 mg) by mouth one hour prior to dental procedures    . aspirin EC 81 MG tablet Take 81 mg by mouth daily.    . Darbepoetin Alfa-Albumin (ARANESP IJ) Inject as directed every 30 (thirty) days.    Marland Kitchen diltiazem (CARDIZEM) 30 MG tablet Take 1/2 tablet the morning of dialysis (Patient taking differently: Take 15 mg by mouth See admin instructions. Take 1/2 tablet the morning of dialysis) 45 tablet 1  . lidocaine-prilocaine (EMLA) cream Apply 1 application topically See admin instructions. Apply topically Monday, Wednesday and Friday before dialysis  6  . multivitamin (RENA-VIT) TABS tablet Take 1 tablet by mouth daily.    . ondansetron (ZOFRAN) 8 MG tablet Take 1 tablet (8 mg total) by mouth 2 (two) times daily as needed (Nausea or vomiting). 30 tablet  1  . pantoprazole (PROTONIX) 40 MG tablet TAKE 1 TABLET (40 MG TOTAL) BY MOUTH DAILY. 90 tablet 1  . prochlorperazine (COMPAZINE) 10 MG tablet Take 1 tablet (10 mg total) by mouth every 6 (six) hours as needed (Nausea or vomiting). 30  tablet 1  . sevelamer carbonate (RENVELA) 800 MG tablet Take 800-2,400 mg by mouth See admin instructions. 2,'400mg'$  three times daily and '800mg'$  twice daily with snacks    . simvastatin (ZOCOR) 20 MG tablet Take 1 tablet (20 mg total) by mouth at bedtime. 30 tablet 11   No current facility-administered medications for this visit.     PHYSICAL EXAMINATION: ECOG PERFORMANCE STATUS: 1 - Symptomatic but completely ambulatory  Vitals:   09/11/17 0818  BP: (!) 110/55  Pulse: 80  Resp: 18  Temp: 98.6 F (37 C)  SpO2: 100%   Filed Weights   09/11/17 0818  Weight: 191 lb 6.4 oz (86.8 kg)    GENERAL:alert, no distress and comfortable SKIN: skin color, texture, turgor are normal, no rashes or significant lesions EYES: normal, Conjunctiva are pink and non-injected, sclera clear OROPHARYNX:no exudate, no erythema and lips, buccal mucosa, and tongue normal  NECK: supple, thyroid normal size, non-tender, without nodularity LYMPH:  no palpable lymphadenopathy in the cervical, axillary or inguinal LUNGS: clear to auscultation and percussion with normal breathing effort HEART: regular rate & rhythm and no murmurs and no lower extremity edema ABDOMEN:abdomen soft, non-tender and normal bowel sounds MUSCULOSKELETAL:no cyanosis of digits and no clubbing  NEURO: alert & oriented x 3 with fluent speech, no focal motor/sensory deficits EXTREMITIES: No lower extremity edema   LABORATORY DATA:  I have reviewed the data as listed CMP Latest Ref Rng & Units 09/04/2017 08/28/2017 08/21/2017  Glucose 70 - 99 mg/dL 92 94 107(H)  BUN 8 - 23 mg/dL 21 25(H) 27(H)  Creatinine 0.44 - 1.00 mg/dL 6.30(HH) 5.96(HH) 6.68(HH)  Sodium 135 - 145 mmol/L 143 143 143  Potassium 3.5 - 5.1 mmol/L 4.4 4.3 4.3  Chloride 98 - 111 mmol/L 102 100 99  CO2 22 - 32 mmol/L '29 29 31  '$ Calcium 8.9 - 10.3 mg/dL 8.9 8.6(L) 8.8(L)  Total Protein 6.5 - 8.1 g/dL 6.8 6.4(L) 6.7  Total Bilirubin 0.3 - 1.2 mg/dL 0.4 0.4 0.4  Alkaline Phos  38 - 126 U/L 163(H) 159(H) 170(H)  AST 15 - 41 U/L '19 15 15  '$ ALT 0 - 44 U/L '17 12 14    '$ Lab Results  Component Value Date   WBC 6.2 09/11/2017   HGB 9.5 (L) 09/11/2017   HCT 27.9 (L) 09/11/2017   MCV 98.6 09/11/2017   PLT 207 09/11/2017   NEUTROABS 4.6 09/11/2017    ASSESSMENT & PLAN:  Malignant neoplasm of upper-outer quadrant of right breast in female, estrogen receptor negative ([place]) 06/19/2017: Right breast skin thickening and heaviness: Screening mammogram detected 2 cm of microcalcifications in the right breast in the posterior third UOQ.? 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 neoadjuvant chemotherapy with Taxol Herceptin weekly x12 followed by Herceptin maintenance for 1 year 2. Followed by surgery with either lumpectomy or mastectomy depending on the MRI findings and treatment response 3. Followed by adjuvant radiation if she undergoes breast conserving surgery 4. Followed by adjuvant antiestrogen therapy  -------------------------------------------------------------------------------- Current treatment: Cycle8day 1 Taxol Herceptin(with Granix support) Labs have been reviewed  Chemo Toxicities: 1.Neutropenia: With Granix  being given on the Saturday prior to treatment, counts are remaining stable 2.mild fatigue 3.Chemotherapy-induced anemia on top of anemia due to chronic kidney disease being monitored closely, today's hemoglobin is  She received blood transfusion previously. 4.  Diarrhea for which she takes Imodium  Patient completely denies neuropathy. Return weekly for chemotherapy and every other week for follow-up with me    No orders of the defined types were placed in this encounter.  The patient has a good understanding of the overall plan. she agrees with it. she will call with any problems that  may develop before the next visit here.   Harriette Ohara, MD 09/11/17

## 2017-09-15 ENCOUNTER — Ambulatory Visit: Payer: Medicare Other

## 2017-09-15 VITALS — BP 143/75 | HR 80 | Temp 98.5°F | Resp 18

## 2017-09-15 DIAGNOSIS — Z5111 Encounter for antineoplastic chemotherapy: Secondary | ICD-10-CM | POA: Diagnosis not present

## 2017-09-15 MED ORDER — TBO-FILGRASTIM 480 MCG/0.8ML ~~LOC~~ SOSY
480.0000 ug | PREFILLED_SYRINGE | Freq: Once | SUBCUTANEOUS | Status: AC
  Administered 2017-09-15: 480 ug via SUBCUTANEOUS

## 2017-09-15 MED ORDER — TBO-FILGRASTIM 480 MCG/0.8ML ~~LOC~~ SOSY
PREFILLED_SYRINGE | SUBCUTANEOUS | Status: AC
  Filled 2017-09-15: qty 0.8

## 2017-09-18 ENCOUNTER — Inpatient Hospital Stay: Payer: Medicare Other | Attending: Hematology and Oncology

## 2017-09-18 ENCOUNTER — Inpatient Hospital Stay: Payer: Medicare Other

## 2017-09-18 VITALS — BP 140/78 | HR 87 | Temp 98.2°F | Resp 20

## 2017-09-18 DIAGNOSIS — Z171 Estrogen receptor negative status [ER-]: Secondary | ICD-10-CM | POA: Diagnosis not present

## 2017-09-18 DIAGNOSIS — N189 Chronic kidney disease, unspecified: Secondary | ICD-10-CM | POA: Insufficient documentation

## 2017-09-18 DIAGNOSIS — C50411 Malignant neoplasm of upper-outer quadrant of right female breast: Secondary | ICD-10-CM | POA: Diagnosis not present

## 2017-09-18 DIAGNOSIS — D631 Anemia in chronic kidney disease: Secondary | ICD-10-CM | POA: Diagnosis not present

## 2017-09-18 DIAGNOSIS — Z79899 Other long term (current) drug therapy: Secondary | ICD-10-CM | POA: Diagnosis not present

## 2017-09-18 DIAGNOSIS — Z95828 Presence of other vascular implants and grafts: Secondary | ICD-10-CM

## 2017-09-18 DIAGNOSIS — G629 Polyneuropathy, unspecified: Secondary | ICD-10-CM | POA: Diagnosis not present

## 2017-09-18 DIAGNOSIS — I129 Hypertensive chronic kidney disease with stage 1 through stage 4 chronic kidney disease, or unspecified chronic kidney disease: Secondary | ICD-10-CM | POA: Diagnosis not present

## 2017-09-18 DIAGNOSIS — Z7689 Persons encountering health services in other specified circumstances: Secondary | ICD-10-CM | POA: Diagnosis not present

## 2017-09-18 DIAGNOSIS — Z992 Dependence on renal dialysis: Secondary | ICD-10-CM | POA: Diagnosis not present

## 2017-09-18 DIAGNOSIS — Z5111 Encounter for antineoplastic chemotherapy: Secondary | ICD-10-CM | POA: Insufficient documentation

## 2017-09-18 LAB — CMP (CANCER CENTER ONLY)
ALT: 10 U/L (ref 0–44)
AST: 12 U/L — AB (ref 15–41)
Albumin: 3.4 g/dL — ABNORMAL LOW (ref 3.5–5.0)
Alkaline Phosphatase: 158 U/L — ABNORMAL HIGH (ref 38–126)
Anion gap: 12 (ref 5–15)
BILIRUBIN TOTAL: 0.4 mg/dL (ref 0.3–1.2)
BUN: 65 mg/dL — AB (ref 8–23)
CHLORIDE: 102 mmol/L (ref 98–111)
CO2: 28 mmol/L (ref 22–32)
Calcium: 9 mg/dL (ref 8.9–10.3)
Creatinine: 12.16 mg/dL (ref 0.44–1.00)
GFR, Est AFR Am: 3 mL/min — ABNORMAL LOW (ref >60)
GFR, Estimated: 3 mL/min — ABNORMAL LOW (ref >60)
GLUCOSE: 122 mg/dL — AB (ref 70–99)
POTASSIUM: 4.4 mmol/L (ref 3.5–5.1)
SODIUM: 142 mmol/L (ref 135–145)
TOTAL PROTEIN: 6.1 g/dL — AB (ref 6.5–8.1)

## 2017-09-18 LAB — CBC WITH DIFFERENTIAL (CANCER CENTER ONLY)
BASOS ABS: 0 10*3/uL (ref 0.0–0.1)
Basophils Relative: 1 %
EOS PCT: 3 %
Eosinophils Absolute: 0.2 10*3/uL (ref 0.0–0.5)
HCT: 24.4 % — ABNORMAL LOW (ref 34.8–46.6)
Hemoglobin: 7.9 g/dL — ABNORMAL LOW (ref 11.6–15.9)
LYMPHS ABS: 1 10*3/uL (ref 0.9–3.3)
LYMPHS PCT: 16 %
MCH: 32.6 pg (ref 25.1–34.0)
MCHC: 32.4 g/dL (ref 31.5–36.0)
MCV: 100.8 fL (ref 79.5–101.0)
MONO ABS: 0.4 10*3/uL (ref 0.1–0.9)
MONOS PCT: 6 %
NEUTROS ABS: 4.5 10*3/uL (ref 1.5–6.5)
Neutrophils Relative %: 74 %
PLATELETS: 193 10*3/uL (ref 145–400)
RBC: 2.42 MIL/uL — ABNORMAL LOW (ref 3.70–5.45)
RDW: 16.7 % — AB (ref 11.2–14.5)
WBC Count: 6.1 10*3/uL (ref 3.9–10.3)

## 2017-09-18 MED ORDER — TRASTUZUMAB CHEMO 150 MG IV SOLR
2.0000 mg/kg | Freq: Once | INTRAVENOUS | Status: AC
  Administered 2017-09-18: 168 mg via INTRAVENOUS
  Filled 2017-09-18: qty 8

## 2017-09-18 MED ORDER — FAMOTIDINE IN NACL 20-0.9 MG/50ML-% IV SOLN
INTRAVENOUS | Status: AC
  Filled 2017-09-18: qty 50

## 2017-09-18 MED ORDER — SODIUM CHLORIDE 0.9% FLUSH
10.0000 mL | INTRAVENOUS | Status: DC | PRN
  Administered 2017-09-18: 10 mL
  Filled 2017-09-18: qty 10

## 2017-09-18 MED ORDER — DIPHENHYDRAMINE HCL 50 MG/ML IJ SOLN
INTRAMUSCULAR | Status: AC
  Filled 2017-09-18: qty 1

## 2017-09-18 MED ORDER — DIPHENHYDRAMINE HCL 50 MG/ML IJ SOLN
25.0000 mg | Freq: Once | INTRAMUSCULAR | Status: AC
  Administered 2017-09-18: 25 mg via INTRAVENOUS

## 2017-09-18 MED ORDER — DEXAMETHASONE SODIUM PHOSPHATE 10 MG/ML IJ SOLN
10.0000 mg | Freq: Once | INTRAMUSCULAR | Status: AC
  Administered 2017-09-18: 10 mg via INTRAVENOUS

## 2017-09-18 MED ORDER — FAMOTIDINE IN NACL 20-0.9 MG/50ML-% IV SOLN
20.0000 mg | Freq: Once | INTRAVENOUS | Status: AC
  Administered 2017-09-18: 20 mg via INTRAVENOUS

## 2017-09-18 MED ORDER — SODIUM CHLORIDE 0.9 % IV SOLN
60.0000 mg/m2 | Freq: Once | INTRAVENOUS | Status: AC
  Administered 2017-09-18: 114 mg via INTRAVENOUS
  Filled 2017-09-18: qty 19

## 2017-09-18 MED ORDER — ACETAMINOPHEN 325 MG PO TABS
650.0000 mg | ORAL_TABLET | Freq: Once | ORAL | Status: AC
  Administered 2017-09-18: 650 mg via ORAL

## 2017-09-18 MED ORDER — DEXAMETHASONE SODIUM PHOSPHATE 10 MG/ML IJ SOLN
INTRAMUSCULAR | Status: AC
  Filled 2017-09-18: qty 1

## 2017-09-18 MED ORDER — HEPARIN SOD (PORK) LOCK FLUSH 100 UNIT/ML IV SOLN
500.0000 [IU] | Freq: Once | INTRAVENOUS | Status: AC | PRN
  Administered 2017-09-18: 500 [IU]
  Filled 2017-09-18: qty 5

## 2017-09-18 MED ORDER — ACETAMINOPHEN 325 MG PO TABS
ORAL_TABLET | ORAL | Status: AC
  Filled 2017-09-18: qty 2

## 2017-09-18 MED ORDER — SODIUM CHLORIDE 0.9 % IV SOLN
Freq: Once | INTRAVENOUS | Status: AC
  Administered 2017-09-18: 15:00:00 via INTRAVENOUS
  Filled 2017-09-18: qty 250

## 2017-09-18 NOTE — Progress Notes (Signed)
Per Dr.Gudena, okay to treat with Cr of 12. Pt on dialysis.

## 2017-09-18 NOTE — Progress Notes (Signed)
OK to tx per Dr. Lindi Adie.

## 2017-09-22 ENCOUNTER — Inpatient Hospital Stay: Payer: Medicare Other

## 2017-09-22 DIAGNOSIS — Z5111 Encounter for antineoplastic chemotherapy: Secondary | ICD-10-CM | POA: Diagnosis not present

## 2017-09-22 DIAGNOSIS — Z95828 Presence of other vascular implants and grafts: Secondary | ICD-10-CM

## 2017-09-22 MED ORDER — TBO-FILGRASTIM 480 MCG/0.8ML ~~LOC~~ SOSY
480.0000 ug | PREFILLED_SYRINGE | Freq: Once | SUBCUTANEOUS | Status: AC
  Administered 2017-09-22: 480 ug via SUBCUTANEOUS

## 2017-09-22 NOTE — Patient Instructions (Signed)
Tbo-Filgrastim injection What is this medicine? TBO-FILGRASTIM (T B O fil GRA stim) is a granulocyte colony-stimulating factor that stimulates the growth of neutrophils, a type of white blood cell important in the body's fight against infection. It is used to reduce the incidence of fever and infection in patients with certain types of cancer who are receiving chemotherapy that affects the bone marrow. This medicine may be used for other purposes; ask your health care provider or pharmacist if you have questions. COMMON BRAND NAME(S): Granix What should I tell my health care provider before I take this medicine? They need to know if you have any of these conditions: -bone scan or tests planned -kidney disease -sickle cell anemia -an unusual or allergic reaction to tbo-filgrastim, filgrastim, pegfilgrastim, other medicines, foods, dyes, or preservatives -pregnant or trying to get pregnant -breast-feeding How should I use this medicine? This medicine is for injection under the skin. If you get this medicine at home, you will be taught how to prepare and give this medicine. Refer to the Instructions for Use that come with your medication packaging. Use exactly as directed. Take your medicine at regular intervals. Do not take your medicine more often than directed. It is important that you put your used needles and syringes in a special sharps container. Do not put them in a trash can. If you do not have a sharps container, call your pharmacist or healthcare provider to get one. Talk to your pediatrician regarding the use of this medicine in children. Special care may be needed. Overdosage: If you think you have taken too much of this medicine contact a poison control center or emergency room at once. NOTE: This medicine is only for you. Do not share this medicine with others. What if I miss a dose? It is important not to miss your dose. Call your doctor or health care professional if you miss a  dose. What may interact with this medicine? This medicine may interact with the following medications: -medicines that may cause a release of neutrophils, such as lithium This list may not describe all possible interactions. Give your health care provider a list of all the medicines, herbs, non-prescription drugs, or dietary supplements you use. Also tell them if you smoke, drink alcohol, or use illegal drugs. Some items may interact with your medicine. What should I watch for while using this medicine? You may need blood work done while you are taking this medicine. What side effects may I notice from receiving this medicine? Side effects that you should report to your doctor or health care professional as soon as possible: -allergic reactions like skin rash, itching or hives, swelling of the face, lips, or tongue -blood in the urine -dark urine -dizziness -fast heartbeat -feeling faint -shortness of breath or breathing problems -signs and symptoms of infection like fever or chills; cough; or sore throat -signs and symptoms of kidney injury like trouble passing urine or change in the amount of urine -stomach or side pain, or pain at the shoulder -sweating -swelling of the legs, ankles, or abdomen -tiredness Side effects that usually do not require medical attention (report to your doctor or health care professional if they continue or are bothersome): -bone pain -headache -muscle pain -vomiting This list may not describe all possible side effects. Call your doctor for medical advice about side effects. You may report side effects to FDA at 1-800-FDA-1088. Where should I keep my medicine? Keep out of the reach of children. Store in a refrigerator between   2 and 8 degrees C (36 and 46 degrees F). Keep in carton to protect from light. Throw away this medicine if it is left out of the refrigerator for more than 5 consecutive days. Throw away any unused medicine after the expiration  date. NOTE: This sheet is a summary. It may not cover all possible information. If you have questions about this medicine, talk to your doctor, pharmacist, or health care provider.  2018 Elsevier/Gold Standard (2015-02-22 19:07:04)  

## 2017-09-22 NOTE — Progress Notes (Signed)
Pt.. tolerated injection well, No further problems or concerns noted. 

## 2017-09-24 ENCOUNTER — Other Ambulatory Visit: Payer: Self-pay

## 2017-09-24 DIAGNOSIS — N186 End stage renal disease: Secondary | ICD-10-CM

## 2017-09-24 DIAGNOSIS — Z992 Dependence on renal dialysis: Principal | ICD-10-CM

## 2017-09-25 ENCOUNTER — Telehealth: Payer: Self-pay | Admitting: Hematology and Oncology

## 2017-09-25 ENCOUNTER — Inpatient Hospital Stay: Payer: Medicare Other

## 2017-09-25 ENCOUNTER — Inpatient Hospital Stay (HOSPITAL_BASED_OUTPATIENT_CLINIC_OR_DEPARTMENT_OTHER): Payer: Medicare Other | Admitting: Hematology and Oncology

## 2017-09-25 DIAGNOSIS — N189 Chronic kidney disease, unspecified: Secondary | ICD-10-CM | POA: Diagnosis not present

## 2017-09-25 DIAGNOSIS — Z171 Estrogen receptor negative status [ER-]: Secondary | ICD-10-CM | POA: Diagnosis not present

## 2017-09-25 DIAGNOSIS — C50411 Malignant neoplasm of upper-outer quadrant of right female breast: Secondary | ICD-10-CM

## 2017-09-25 DIAGNOSIS — D631 Anemia in chronic kidney disease: Secondary | ICD-10-CM

## 2017-09-25 DIAGNOSIS — Z95828 Presence of other vascular implants and grafts: Secondary | ICD-10-CM

## 2017-09-25 DIAGNOSIS — I129 Hypertensive chronic kidney disease with stage 1 through stage 4 chronic kidney disease, or unspecified chronic kidney disease: Secondary | ICD-10-CM | POA: Diagnosis not present

## 2017-09-25 DIAGNOSIS — Z79899 Other long term (current) drug therapy: Secondary | ICD-10-CM

## 2017-09-25 DIAGNOSIS — Z992 Dependence on renal dialysis: Secondary | ICD-10-CM

## 2017-09-25 DIAGNOSIS — Z5111 Encounter for antineoplastic chemotherapy: Secondary | ICD-10-CM | POA: Diagnosis not present

## 2017-09-25 LAB — CBC WITH DIFFERENTIAL (CANCER CENTER ONLY)
BASOS ABS: 0.1 10*3/uL (ref 0.0–0.1)
Basophils Relative: 1 %
EOS PCT: 2 %
Eosinophils Absolute: 0.2 10*3/uL (ref 0.0–0.5)
HCT: 24.7 % — ABNORMAL LOW (ref 34.8–46.6)
Hemoglobin: 7.8 g/dL — ABNORMAL LOW (ref 11.6–15.9)
Lymphocytes Relative: 11 %
Lymphs Abs: 1 10*3/uL (ref 0.9–3.3)
MCH: 32.6 pg (ref 25.1–34.0)
MCHC: 31.6 g/dL (ref 31.5–36.0)
MCV: 103.3 fL — AB (ref 79.5–101.0)
Monocytes Absolute: 0.6 10*3/uL (ref 0.1–0.9)
Monocytes Relative: 6 %
Neutro Abs: 7.5 10*3/uL — ABNORMAL HIGH (ref 1.5–6.5)
Neutrophils Relative %: 80 %
PLATELETS: 228 10*3/uL (ref 145–400)
RBC: 2.39 MIL/uL — ABNORMAL LOW (ref 3.70–5.45)
RDW: 17.3 % — ABNORMAL HIGH (ref 11.2–14.5)
WBC: 9.3 10*3/uL (ref 3.9–10.3)

## 2017-09-25 LAB — CMP (CANCER CENTER ONLY)
ALT: 11 U/L (ref 0–44)
AST: 17 U/L (ref 15–41)
Albumin: 3.6 g/dL (ref 3.5–5.0)
Alkaline Phosphatase: 165 U/L — ABNORMAL HIGH (ref 38–126)
Anion gap: 12 (ref 5–15)
BUN: 22 mg/dL (ref 8–23)
CHLORIDE: 102 mmol/L (ref 98–111)
CO2: 30 mmol/L (ref 22–32)
CREATININE: 6.45 mg/dL — AB (ref 0.44–1.00)
Calcium: 9.4 mg/dL (ref 8.9–10.3)
GFR, EST NON AFRICAN AMERICAN: 6 mL/min — AB (ref >60)
GFR, Est AFR Am: 7 mL/min — ABNORMAL LOW (ref >60)
GLUCOSE: 92 mg/dL (ref 70–99)
Potassium: 4.5 mmol/L (ref 3.5–5.1)
SODIUM: 144 mmol/L (ref 135–145)
Total Bilirubin: 0.4 mg/dL (ref 0.3–1.2)
Total Protein: 6.5 g/dL (ref 6.5–8.1)

## 2017-09-25 MED ORDER — HEPARIN SOD (PORK) LOCK FLUSH 100 UNIT/ML IV SOLN
500.0000 [IU] | Freq: Once | INTRAVENOUS | Status: AC | PRN
  Administered 2017-09-25: 500 [IU]
  Filled 2017-09-25: qty 5

## 2017-09-25 MED ORDER — SODIUM CHLORIDE 0.9 % IV SOLN
Freq: Once | INTRAVENOUS | Status: AC
  Administered 2017-09-25: 11:00:00 via INTRAVENOUS
  Filled 2017-09-25: qty 250

## 2017-09-25 MED ORDER — FAMOTIDINE IN NACL 20-0.9 MG/50ML-% IV SOLN
20.0000 mg | Freq: Once | INTRAVENOUS | Status: AC
  Administered 2017-09-25: 20 mg via INTRAVENOUS

## 2017-09-25 MED ORDER — SODIUM CHLORIDE 0.9 % IV SOLN
60.0000 mg/m2 | Freq: Once | INTRAVENOUS | Status: AC
  Administered 2017-09-25: 114 mg via INTRAVENOUS
  Filled 2017-09-25: qty 19

## 2017-09-25 MED ORDER — DIPHENHYDRAMINE HCL 50 MG/ML IJ SOLN
25.0000 mg | Freq: Once | INTRAMUSCULAR | Status: AC
  Administered 2017-09-25: 25 mg via INTRAVENOUS

## 2017-09-25 MED ORDER — DEXAMETHASONE SODIUM PHOSPHATE 10 MG/ML IJ SOLN
INTRAMUSCULAR | Status: AC
  Filled 2017-09-25: qty 1

## 2017-09-25 MED ORDER — FAMOTIDINE IN NACL 20-0.9 MG/50ML-% IV SOLN
INTRAVENOUS | Status: AC
  Filled 2017-09-25: qty 50

## 2017-09-25 MED ORDER — ACETAMINOPHEN 325 MG PO TABS
650.0000 mg | ORAL_TABLET | Freq: Once | ORAL | Status: AC
  Administered 2017-09-25: 650 mg via ORAL

## 2017-09-25 MED ORDER — TRASTUZUMAB CHEMO 150 MG IV SOLR
2.0000 mg/kg | Freq: Once | INTRAVENOUS | Status: AC
  Administered 2017-09-25: 168 mg via INTRAVENOUS
  Filled 2017-09-25: qty 8

## 2017-09-25 MED ORDER — DEXAMETHASONE SODIUM PHOSPHATE 10 MG/ML IJ SOLN
10.0000 mg | Freq: Once | INTRAMUSCULAR | Status: AC
  Administered 2017-09-25: 10 mg via INTRAVENOUS

## 2017-09-25 MED ORDER — SODIUM CHLORIDE 0.9% FLUSH
10.0000 mL | INTRAVENOUS | Status: DC | PRN
  Administered 2017-09-25: 10 mL
  Filled 2017-09-25: qty 10

## 2017-09-25 MED ORDER — ACETAMINOPHEN 325 MG PO TABS
ORAL_TABLET | ORAL | Status: AC
  Filled 2017-09-25: qty 2

## 2017-09-25 MED ORDER — DIPHENHYDRAMINE HCL 50 MG/ML IJ SOLN
INTRAMUSCULAR | Status: AC
  Filled 2017-09-25: qty 1

## 2017-09-25 NOTE — Telephone Encounter (Signed)
Per 9/10 los.  Patient here for infusion today.  Taking over calendar with new appts added for Lab on 9/12 and Blood on 9/14.

## 2017-09-25 NOTE — Progress Notes (Signed)
Okay to treat with hg 7.8 and Cr 6.45 per Dr.Gudena.

## 2017-09-25 NOTE — Progress Notes (Signed)
Patient Care Team: Lucianne Lei, MD as PCP - General (Family Medicine) Lorretta Harp, MD as PCP - Cardiology (Cardiology) Donato Heinz, MD (Nephrology) Lorretta Harp, MD as Consulting Physician (Cardiology) Pyrtle, Lajuan Lines, MD as Consulting Physician (Gastroenterology)  DIAGNOSIS:  Encounter Diagnosis  Name Primary?  . Malignant neoplasm of upper-outer quadrant of right breast in female, estrogen receptor negative ([place])     SUMMARY OF ONCOLOGIC HISTORY:   Malignant neoplasm of upper-outer quadrant of right breast in female, estrogen receptor negative ([place])   06/19/2017 Initial Diagnosis    Right breast skin thickening and heaviness: Screening mammogram detected 2 cm of microcalcifications in the right breast in the posterior third UOQ.?  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  She is tolerating the treatment fairly well.  She gets hemodialysis 3 times a week.  She feels generally weak but denies any lightheadedness or dizziness.  REVIEW OF SYSTEMS:   Constitutional: Denies fevers, chills or abnormal weight loss, complains of fatigue Eyes: Denies blurriness of vision Ears, nose, mouth, throat, and face: Denies mucositis or sore throat Respiratory: Denies cough, dyspnea or wheezes Cardiovascular: Denies palpitation, chest discomfort Gastrointestinal: Episode  of nausea and vomiting with hemodialysis Skin: Denies abnormal skin rashes Lymphatics: Denies new lymphadenopathy or easy bruising Neurological:Denies numbness, tingling or new weaknesses Behavioral/Psych: Mood is stable, no new changes  Extremities: No lower extremity edema  All other systems were reviewed with the patient and are negative.  I have reviewed the past medical history, past surgical history, social history and family history with the patient and they are unchanged from previous note.  ALLERGIES:  has No Known Allergies.  MEDICATIONS:  Current Outpatient Medications  Medication Sig Dispense Refill  . acetaminophen (TYLENOL) 500 MG tablet Take 1,000 mg by mouth daily as needed for mild pain, moderate pain or headache.     Marland Kitchen amoxicillin (AMOXIL) 500 MG capsule Take 2,000 mg by mouth See admin instructions. Take 4 capsules (2000 mg) by mouth one hour prior to dental procedures    . aspirin EC 81 MG tablet Take 81 mg by mouth daily.    . Darbepoetin Alfa-Albumin (ARANESP IJ) Inject as directed every 30 (thirty) days.    Marland Kitchen diltiazem (CARDIZEM) 30 MG tablet Take 1/2 tablet the morning of dialysis (Patient taking differently: Take 15 mg by mouth See admin instructions. Take 1/2 tablet the morning of dialysis) 45 tablet 1  . lidocaine-prilocaine (EMLA) cream Apply 1 application topically See admin instructions. Apply topically Monday, Wednesday and Friday before dialysis  6  . multivitamin (RENA-VIT) TABS tablet Take 1 tablet by mouth daily.    . ondansetron (ZOFRAN) 8 MG tablet Take 1 tablet (8 mg total) by mouth 2 (two) times daily as needed (Nausea or vomiting). 30 tablet 1  . pantoprazole (PROTONIX) 40 MG tablet TAKE 1 TABLET (40 MG TOTAL) BY  MOUTH DAILY. 90 tablet 1  . prochlorperazine (COMPAZINE) 10 MG tablet Take 1 tablet (10 mg total) by mouth every 6 (six) hours as needed (Nausea or vomiting). 30 tablet 1  . sevelamer carbonate (RENVELA) 800 MG tablet Take 800-2,400 mg by  mouth See admin instructions. 2,421m three times daily and 8075mtwice daily with snacks    . simvastatin (ZOCOR) 20 MG tablet Take 1 tablet (20 mg total) by mouth at bedtime. 30 tablet 11   No current facility-administered medications for this visit.     PHYSICAL EXAMINATION: ECOG PERFORMANCE STATUS: 1 - Symptomatic but completely ambulatory  Vitals:   09/25/17 1010  BP: 135/74  Pulse: 82  Resp: 18  Temp: 98 F (36.7 C)  SpO2: 100%   Filed Weights   09/25/17 1010  Weight: 194 lb (88 kg)    GENERAL:alert, no distress and comfortable SKIN: skin color, texture, turgor are normal, no rashes or significant lesions EYES: normal, Conjunctiva are pink and non-injected, sclera clear OROPHARYNX:no exudate, no erythema and lips, buccal mucosa, and tongue normal  NECK: supple, thyroid normal size, non-tender, without nodularity LYMPH:  no palpable lymphadenopathy in the cervical, axillary or inguinal LUNGS: clear to auscultation and percussion with normal breathing effort HEART: regular rate & rhythm and no murmurs and no lower extremity edema ABDOMEN:abdomen soft, non-tender and normal bowel sounds MUSCULOSKELETAL:no cyanosis of digits and no clubbing  NEURO: alert & oriented x 3 with fluent speech, no focal motor/sensory deficits EXTREMITIES: No lower extremity edema   LABORATORY DATA:  I have reviewed the data as listed CMP Latest Ref Rng & Units 09/18/2017 09/11/2017 09/04/2017  Glucose 70 - 99 mg/dL 122(H) 94 92  BUN 8 - 23 mg/dL 65(H) 24(H) 21  Creatinine 0.44 - 1.00 mg/dL 12.16(HH) 6.22(HH) 6.30(HH)  Sodium 135 - 145 mmol/L 142 144 143  Potassium 3.5 - 5.1 mmol/L 4.4 4.3 4.4  Chloride 98 - 111 mmol/L 102 101 102  CO2 22 - 32 mmol/L _0 Calcium 8.9 - 10.3 mg/dL 9.0 9.3 8.9  Total Protein 6.5 - 8.1 g/dL 6.1(L) 6.6 6.8  Total Bilirubin 0.3 - 1.2 mg/dL 0.4 0.3 0.4  Alkaline Phos 38 - 126 U/L 158(H) 174(H) 163(H)  AST 15 - 41 U/L 12(L) 17 19  ALT 0 - 44 U/L _1 Lab Results  Component Value Date   WBC 9.3 09/25/2017   HGB 7.8 (L) 09/25/2017   HCT 24.7 (L) 09/25/2017   MCV 103.3 (H) 09/25/2017   PLT 228 09/25/2017   NEUTROABS 7.5 (H) 09/25/2017    ASSESSMENT & PLAN:  Malignant neoplasm of upper-outer quadrant of right breast in female, estrogen receptor negative [place]2017: Right breast skin thickening and heaviness: Screening mammogram detected 2 cm of microcalcifications in the right breast in the posterior third UOQ.? 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 neoadjuvant chemotherapy with Taxol Herceptin weekly x12 followed by Herceptin maintenance for 1 year 2. Followed by surgery with either lumpectomy or mastectomy depending on the MRI findings and treatment response 3. Followed by adjuvant radiation if she undergoes breast conserving surgery 4. Followed by adjuvant antiestrogen therapy  -------------------------------------------------------------------------------- Current treatment: Cycle10day 1 Taxol Herceptin(with Granix support) Labs have been reviewed  Chemo Toxicities: 1.Neutropenia: With Granix being given on the Saturday prior to treatment, counts are remaining stable 2.mild fatigue 3.Chemotherapy-induced anemia  on top of anemia due to chronic kidney disease being monitored closely, today's hemoglobin is  She received blood transfusion previously. 4.  Diarrhea for which she takes Imodium 5. Peripheral neuropathy grade 1  Return weekly for chemotherapy and in 2 weeks for follow-up with me for her last cycle I will arrange for breast mammogram and ultrasound after 12 cycles. I will also request Dr. Ninfa Linden for a follow-up appointment after the breast imaging and tumor board.    Orders Placed This Encounter  Procedures  . MM DIAG BREAST TOMO UNI RIGHT     Standing Status:   Future    Standing Expiration Date:   09/26/2018    Order Specific Question:   Reason for Exam (SYMPTOM  OR DIAGNOSIS REQUIRED)    Answer:   Post neoadj chemo follow up    Order Specific Question:   Preferred imaging location?    Answer:   [facility]  . US BREAST LTD UNI RIGHT INC AXILLA    Standing Status:   Future    Standing Expiration Date:   11/26/2018    Order Specific Question:   Reason for Exam (SYMPTOM  OR DIAGNOSIS REQUIRED)    Answer:   Post neoadj chemo    Order Specific Question:   Preferred imaging location?    Answer:   [facility]   The patient has a good understanding of the overall plan. she agrees with it. she will call with any problems that may develop before the next visit here.   Harriette Ohara, MD 09/25/17

## 2017-09-25 NOTE — Assessment & Plan Note (Signed)
06/19/2017: Right breast skin thickening and heaviness: Screening mammogram detected 2 cm of microcalcifications in the right breast in the posterior third UOQ.? 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 neoadjuvant chemotherapy with Taxol Herceptin weekly x12 followed by Herceptin maintenance for 1 year 2. Followed by surgery with either lumpectomy or mastectomy depending on the MRI findings and treatment response 3. Followed by adjuvant radiation if she undergoes breast conserving surgery 4. Followed by adjuvant antiestrogen therapy  -------------------------------------------------------------------------------- Current treatment: Cycle10day 1 Taxol Herceptin(with Granix support) Labs have been reviewed  Chemo Toxicities: 1.Neutropenia: With Granix being given on the Saturday prior to treatment, counts are remaining stable 2.mild fatigue 3.Chemotherapy-induced anemia on top of anemia due to chronic kidney disease being monitored closely, today's hemoglobin is  She received blood transfusion previously. 4.  Diarrhea for which she takes Imodium  Patient completely denies neuropathy. Return weekly for chemotherapy and every other week for follow-up with me I will arrange for breast MRI after 12 cycles. I will also request surgery for a follow-up appointment after the breast MRI.

## 2017-09-25 NOTE — Patient Instructions (Signed)
[facility] Discharge Instructions for Patients Receiving Chemotherapy  Today you received the following chemotherapy agents:  Herceptin and Taxol.  To help prevent nausea and vomiting after your treatment, we encourage you to take your nausea medication as directed.   If you develop nausea and vomiting that is not controlled by your nausea medication, call the clinic.   BELOW ARE SYMPTOMS THAT SHOULD BE REPORTED IMMEDIATELY:  *FEVER GREATER THAN 100.5 F  *CHILLS WITH OR WITHOUT FEVER  NAUSEA AND VOMITING THAT IS NOT CONTROLLED WITH YOUR NAUSEA MEDICATION  *UNUSUAL SHORTNESS OF BREATH  *UNUSUAL BRUISING OR BLEEDING  TENDERNESS IN MOUTH AND THROAT WITH OR WITHOUT PRESENCE OF ULCERS  *URINARY PROBLEMS  *BOWEL PROBLEMS  UNUSUAL RASH Items with * indicate a potential emergency and should be followed up as soon as possible.  Feel free to call the clinic should you have any questions or concerns. The clinic phone number is [phone].  Please show the CHEMO ALERT CARD at check-in to the Emergency Department and triage nurse.   

## 2017-09-25 NOTE — Progress Notes (Signed)
Ok to treat with CMP per MD Lindi Adie

## 2017-09-26 ENCOUNTER — Other Ambulatory Visit: Payer: Self-pay

## 2017-09-26 DIAGNOSIS — D6481 Anemia due to antineoplastic chemotherapy: Secondary | ICD-10-CM

## 2017-09-26 DIAGNOSIS — C50411 Malignant neoplasm of upper-outer quadrant of right female breast: Secondary | ICD-10-CM

## 2017-09-26 DIAGNOSIS — T451X5A Adverse effect of antineoplastic and immunosuppressive drugs, initial encounter: Secondary | ICD-10-CM

## 2017-09-26 DIAGNOSIS — Z171 Estrogen receptor negative status [ER-]: Principal | ICD-10-CM

## 2017-09-27 ENCOUNTER — Telehealth: Payer: Self-pay

## 2017-09-27 ENCOUNTER — Inpatient Hospital Stay: Payer: Medicare Other

## 2017-09-27 ENCOUNTER — Ambulatory Visit: Payer: Medicare Other | Admitting: Adult Health

## 2017-09-27 DIAGNOSIS — Z5111 Encounter for antineoplastic chemotherapy: Secondary | ICD-10-CM | POA: Diagnosis not present

## 2017-09-27 DIAGNOSIS — T451X5A Adverse effect of antineoplastic and immunosuppressive drugs, initial encounter: Secondary | ICD-10-CM

## 2017-09-27 DIAGNOSIS — Z171 Estrogen receptor negative status [ER-]: Principal | ICD-10-CM

## 2017-09-27 DIAGNOSIS — D6481 Anemia due to antineoplastic chemotherapy: Secondary | ICD-10-CM

## 2017-09-27 DIAGNOSIS — C50411 Malignant neoplasm of upper-outer quadrant of right female breast: Secondary | ICD-10-CM

## 2017-09-27 LAB — CMP (CANCER CENTER ONLY)
ALT: 11 U/L (ref 0–44)
AST: 19 U/L (ref 15–41)
Albumin: 3.7 g/dL (ref 3.5–5.0)
Alkaline Phosphatase: 158 U/L — ABNORMAL HIGH (ref 38–126)
Anion gap: 13 (ref 5–15)
BUN: 23 mg/dL (ref 8–23)
CHLORIDE: 101 mmol/L (ref 98–111)
CO2: 31 mmol/L (ref 22–32)
CREATININE: 5.53 mg/dL — AB (ref 0.44–1.00)
Calcium: 8.7 mg/dL — ABNORMAL LOW (ref 8.9–10.3)
GFR, EST NON AFRICAN AMERICAN: 7 mL/min — AB (ref >60)
GFR, Est AFR Am: 9 mL/min — ABNORMAL LOW (ref >60)
GLUCOSE: 93 mg/dL (ref 70–99)
POTASSIUM: 4.1 mmol/L (ref 3.5–5.1)
SODIUM: 145 mmol/L (ref 135–145)
Total Bilirubin: 0.6 mg/dL (ref 0.3–1.2)
Total Protein: 6.8 g/dL (ref 6.5–8.1)

## 2017-09-27 LAB — CBC WITH DIFFERENTIAL (CANCER CENTER ONLY)
Basophils Absolute: 0.1 10*3/uL (ref 0.0–0.1)
Basophils Relative: 2 %
EOS ABS: 0.1 10*3/uL (ref 0.0–0.5)
Eosinophils Relative: 4 %
HEMATOCRIT: 28.1 % — AB (ref 34.8–46.6)
Hemoglobin: 9 g/dL — ABNORMAL LOW (ref 11.6–15.9)
LYMPHS ABS: 0.6 10*3/uL — AB (ref 0.9–3.3)
LYMPHS PCT: 25 %
MCH: 33.5 pg (ref 25.1–34.0)
MCHC: 32 g/dL (ref 31.5–36.0)
MCV: 104.5 fL — AB (ref 79.5–101.0)
MONO ABS: 0.2 10*3/uL (ref 0.1–0.9)
Monocytes Relative: 7 %
Neutro Abs: 1.5 10*3/uL (ref 1.5–6.5)
Neutrophils Relative %: 62 %
PLATELETS: 245 10*3/uL (ref 145–400)
RBC: 2.69 MIL/uL — ABNORMAL LOW (ref 3.70–5.45)
RDW: 17.3 % — ABNORMAL HIGH (ref 11.2–14.5)
WBC Count: 2.4 10*3/uL — ABNORMAL LOW (ref 3.9–10.3)
nRBC: 1 /100 WBC — ABNORMAL HIGH

## 2017-09-27 LAB — SAMPLE TO BLOOD BANK

## 2017-09-27 NOTE — Telephone Encounter (Signed)
Per Dr. Lindi Adie d/t Hgb of 9.0 okay to cancel blood transfusion for 09/29/2017.  Nurse notified patient, voiced understanding and agreement.  Communicated to keep injection appointment.  Blood bank notified.   Dr. Lindi Adie aware of critical creatinine levels.

## 2017-09-29 ENCOUNTER — Inpatient Hospital Stay: Payer: Medicare Other

## 2017-09-29 VITALS — BP 111/68 | HR 96 | Temp 97.7°F | Resp 18

## 2017-09-29 DIAGNOSIS — Z5111 Encounter for antineoplastic chemotherapy: Secondary | ICD-10-CM | POA: Diagnosis not present

## 2017-09-29 DIAGNOSIS — Z95828 Presence of other vascular implants and grafts: Secondary | ICD-10-CM

## 2017-09-29 MED ORDER — TBO-FILGRASTIM 480 MCG/0.8ML ~~LOC~~ SOSY
480.0000 ug | PREFILLED_SYRINGE | Freq: Once | SUBCUTANEOUS | Status: AC
  Administered 2017-09-29: 480 ug via SUBCUTANEOUS

## 2017-09-29 MED ORDER — TBO-FILGRASTIM 480 MCG/0.8ML ~~LOC~~ SOSY
PREFILLED_SYRINGE | SUBCUTANEOUS | Status: AC
  Filled 2017-09-29: qty 0.8

## 2017-09-29 NOTE — Patient Instructions (Signed)
Pegfilgrastim injection What is this medicine? PEGFILGRASTIM (PEG fil gra stim) is a long-acting granulocyte colony-stimulating factor that stimulates the growth of neutrophils, a type of white blood cell important in the body's fight against infection. It is used to reduce the incidence of fever and infection in patients with certain types of cancer who are receiving chemotherapy that affects the bone marrow, and to increase survival after being exposed to high doses of radiation. This medicine may be used for other purposes; ask your health care provider or pharmacist if you have questions. COMMON BRAND NAME(S): Neulasta What should I tell my health care provider before I take this medicine? They need to know if you have any of these conditions: -kidney disease -latex allergy -ongoing radiation therapy -sickle cell disease -skin reactions to acrylic adhesives (On-Body Injector only) -an unusual or allergic reaction to pegfilgrastim, filgrastim, other medicines, foods, dyes, or preservatives -pregnant or trying to get pregnant -breast-feeding How should I use this medicine? This medicine is for injection under the skin. If you get this medicine at home, you will be taught how to prepare and give the pre-filled syringe or how to use the On-body Injector. Refer to the patient Instructions for Use for detailed instructions. Use exactly as directed. Tell your healthcare provider immediately if you suspect that the On-body Injector may not have performed as intended or if you suspect the use of the On-body Injector resulted in a missed or partial dose. It is important that you put your used needles and syringes in a special sharps container. Do not put them in a trash can. If you do not have a sharps container, call your pharmacist or healthcare provider to get one. Talk to your pediatrician regarding the use of this medicine in children. While this drug may be prescribed for selected conditions,  precautions do apply. Overdosage: If you think you have taken too much of this medicine contact a poison control center or emergency room at once. NOTE: This medicine is only for you. Do not share this medicine with others. What if I miss a dose? It is important not to miss your dose. Call your doctor or health care professional if you miss your dose. If you miss a dose due to an On-body Injector failure or leakage, a new dose should be administered as soon as possible using a single prefilled syringe for manual use. What may interact with this medicine? Interactions have not been studied. Give your health care provider a list of all the medicines, herbs, non-prescription drugs, or dietary supplements you use. Also tell them if you smoke, drink alcohol, or use illegal drugs. Some items may interact with your medicine. This list may not describe all possible interactions. Give your health care provider a list of all the medicines, herbs, non-prescription drugs, or dietary supplements you use. Also tell them if you smoke, drink alcohol, or use illegal drugs. Some items may interact with your medicine. What should I watch for while using this medicine? You may need blood work done while you are taking this medicine. If you are going to need a MRI, CT scan, or other procedure, tell your doctor that you are using this medicine (On-Body Injector only). What side effects may I notice from receiving this medicine? Side effects that you should report to your doctor or health care professional as soon as possible: -allergic reactions like skin rash, itching or hives, swelling of the face, lips, or tongue -dizziness -fever -pain, redness, or irritation at site   where injected -pinpoint red spots on the skin -red or dark-brown urine -shortness of breath or breathing problems -stomach or side pain, or pain at the shoulder -swelling -tiredness -trouble passing urine or change in the amount of urine Side  effects that usually do not require medical attention (report to your doctor or health care professional if they continue or are bothersome): -bone pain -muscle pain This list may not describe all possible side effects. Call your doctor for medical advice about side effects. You may report side effects to FDA at 1-800-FDA-1088. Where should I keep my medicine? Keep out of the reach of children. Store pre-filled syringes in a refrigerator between 2 and 8 degrees C (36 and 46 degrees F). Do not freeze. Keep in carton to protect from light. Throw away this medicine if it is left out of the refrigerator for more than 48 hours. Throw away any unused medicine after the expiration date. NOTE: This sheet is a summary. It may not cover all possible information. If you have questions about this medicine, talk to your doctor, pharmacist, or health care provider.  2018 Elsevier/Gold Standard (2015-12-30 12:58:03)  

## 2017-10-02 ENCOUNTER — Other Ambulatory Visit: Payer: Self-pay | Admitting: *Deleted

## 2017-10-02 ENCOUNTER — Encounter: Payer: Self-pay | Admitting: *Deleted

## 2017-10-02 ENCOUNTER — Inpatient Hospital Stay: Payer: Medicare Other

## 2017-10-02 VITALS — BP 106/70 | HR 84 | Temp 98.0°F | Resp 18

## 2017-10-02 DIAGNOSIS — Z5111 Encounter for antineoplastic chemotherapy: Secondary | ICD-10-CM | POA: Diagnosis not present

## 2017-10-02 DIAGNOSIS — D649 Anemia, unspecified: Secondary | ICD-10-CM

## 2017-10-02 DIAGNOSIS — Z95828 Presence of other vascular implants and grafts: Secondary | ICD-10-CM

## 2017-10-02 DIAGNOSIS — Z171 Estrogen receptor negative status [ER-]: Principal | ICD-10-CM

## 2017-10-02 DIAGNOSIS — C50411 Malignant neoplasm of upper-outer quadrant of right female breast: Secondary | ICD-10-CM

## 2017-10-02 LAB — CBC WITH DIFFERENTIAL (CANCER CENTER ONLY)
BASOS PCT: 1 %
Basophils Absolute: 0.1 10*3/uL (ref 0.0–0.1)
EOS ABS: 0.2 10*3/uL (ref 0.0–0.5)
Eosinophils Relative: 2 %
HEMATOCRIT: 22.7 % — AB (ref 34.8–46.6)
HEMOGLOBIN: 7.2 g/dL — AB (ref 11.6–15.9)
Lymphocytes Relative: 11 %
Lymphs Abs: 1 10*3/uL (ref 0.9–3.3)
MCH: 33 pg (ref 25.1–34.0)
MCHC: 31.7 g/dL (ref 31.5–36.0)
MCV: 104.1 fL — ABNORMAL HIGH (ref 79.5–101.0)
Monocytes Absolute: 0.5 10*3/uL (ref 0.1–0.9)
Monocytes Relative: 6 %
NEUTROS ABS: 7.4 10*3/uL — AB (ref 1.5–6.5)
NEUTROS PCT: 80 %
Platelet Count: 222 10*3/uL (ref 145–400)
RBC: 2.18 MIL/uL — AB (ref 3.70–5.45)
RDW: 17.7 % — ABNORMAL HIGH (ref 11.2–14.5)
WBC Count: 9.2 10*3/uL (ref 3.9–10.3)

## 2017-10-02 LAB — CMP (CANCER CENTER ONLY)
ALK PHOS: 152 U/L — AB (ref 38–126)
ALT: 14 U/L (ref 0–44)
ANION GAP: 8 (ref 5–15)
AST: 16 U/L (ref 15–41)
Albumin: 3.4 g/dL — ABNORMAL LOW (ref 3.5–5.0)
BILIRUBIN TOTAL: 0.4 mg/dL (ref 0.3–1.2)
BUN: 22 mg/dL (ref 8–23)
CALCIUM: 8.9 mg/dL (ref 8.9–10.3)
CO2: 33 mmol/L — AB (ref 22–32)
CREATININE: 6.39 mg/dL — AB (ref 0.44–1.00)
Chloride: 103 mmol/L (ref 98–111)
GFR, EST AFRICAN AMERICAN: 7 mL/min — AB (ref >60)
GFR, EST NON AFRICAN AMERICAN: 6 mL/min — AB (ref >60)
Glucose, Bld: 115 mg/dL — ABNORMAL HIGH (ref 70–99)
Potassium: 4.7 mmol/L (ref 3.5–5.1)
SODIUM: 144 mmol/L (ref 135–145)
TOTAL PROTEIN: 6.2 g/dL — AB (ref 6.5–8.1)

## 2017-10-02 MED ORDER — SODIUM CHLORIDE 0.9% FLUSH
10.0000 mL | INTRAVENOUS | Status: DC | PRN
  Administered 2017-10-02: 10 mL
  Filled 2017-10-02: qty 10

## 2017-10-02 MED ORDER — HEPARIN SOD (PORK) LOCK FLUSH 100 UNIT/ML IV SOLN
500.0000 [IU] | Freq: Once | INTRAVENOUS | Status: AC | PRN
  Administered 2017-10-02: 500 [IU]
  Filled 2017-10-02: qty 5

## 2017-10-02 NOTE — Progress Notes (Signed)
This RN spoke with pt per call from treating nurse for infusion per lab results as well as noted assessment for neuropathy in fingers and toes.  Per lab review -with noted heme of 7.2 - MD ordered blood transfusion for this week if pt is symptomatic.  Request to hold taxol today.  Pt is scheduled for MD follow up with treatment post visit next week.  Above discussed with pt- who does feel symptomatic and would like to proceed with transfusion.  Chelsea obtained today with request for blood transfusion for 9/19 per non dialysis day.

## 2017-10-02 NOTE — Patient Instructions (Signed)

## 2017-10-03 ENCOUNTER — Telehealth: Payer: Self-pay | Admitting: Hematology and Oncology

## 2017-10-03 NOTE — Telephone Encounter (Signed)
Spoke to pt regarding upcoming appts per 9/17 sch message

## 2017-10-04 ENCOUNTER — Inpatient Hospital Stay: Payer: Medicare Other

## 2017-10-04 DIAGNOSIS — Z5111 Encounter for antineoplastic chemotherapy: Secondary | ICD-10-CM | POA: Diagnosis not present

## 2017-10-04 DIAGNOSIS — D649 Anemia, unspecified: Secondary | ICD-10-CM

## 2017-10-04 LAB — PREPARE RBC (CROSSMATCH)

## 2017-10-04 MED ORDER — SODIUM CHLORIDE 0.9 % IV SOLN
Freq: Once | INTRAVENOUS | Status: AC
  Administered 2017-10-04: 11:00:00 via INTRAVENOUS
  Filled 2017-10-04: qty 250

## 2017-10-04 MED ORDER — DIPHENHYDRAMINE HCL 25 MG PO CAPS
25.0000 mg | ORAL_CAPSULE | Freq: Once | ORAL | Status: AC
  Administered 2017-10-04: 25 mg via ORAL

## 2017-10-04 MED ORDER — HEPARIN SOD (PORK) LOCK FLUSH 100 UNIT/ML IV SOLN
500.0000 [IU] | Freq: Every day | INTRAVENOUS | Status: AC | PRN
  Administered 2017-10-04: 500 [IU]
  Filled 2017-10-04: qty 5

## 2017-10-04 MED ORDER — DIPHENHYDRAMINE HCL 25 MG PO CAPS
ORAL_CAPSULE | ORAL | Status: AC
  Filled 2017-10-04: qty 1

## 2017-10-04 MED ORDER — SODIUM CHLORIDE 0.9% IV SOLUTION
250.0000 mL | Freq: Once | INTRAVENOUS | Status: AC
  Administered 2017-10-04: 250 mL via INTRAVENOUS
  Filled 2017-10-04: qty 250

## 2017-10-04 MED ORDER — SODIUM CHLORIDE 0.9% FLUSH
10.0000 mL | INTRAVENOUS | Status: AC | PRN
  Administered 2017-10-04: 10 mL
  Filled 2017-10-04: qty 10

## 2017-10-04 MED ORDER — ACETAMINOPHEN 325 MG PO TABS
650.0000 mg | ORAL_TABLET | Freq: Once | ORAL | Status: AC
  Administered 2017-10-04: 650 mg via ORAL

## 2017-10-04 MED ORDER — ACETAMINOPHEN 325 MG PO TABS
ORAL_TABLET | ORAL | Status: AC
  Filled 2017-10-04: qty 2

## 2017-10-04 NOTE — Patient Instructions (Signed)

## 2017-10-05 ENCOUNTER — Encounter (HOSPITAL_COMMUNITY): Payer: Self-pay | Admitting: Emergency Medicine

## 2017-10-05 ENCOUNTER — Other Ambulatory Visit: Payer: Self-pay

## 2017-10-05 ENCOUNTER — Emergency Department (HOSPITAL_COMMUNITY)
Admission: EM | Admit: 2017-10-05 | Discharge: 2017-10-05 | Disposition: A | Discharge status: Home / Self Care | Payer: Medicare Other | Attending: Emergency Medicine | Admitting: Emergency Medicine

## 2017-10-05 ENCOUNTER — Emergency Department (HOSPITAL_COMMUNITY): Payer: Medicare Other

## 2017-10-05 DIAGNOSIS — Z7982 Long term (current) use of aspirin: Secondary | ICD-10-CM | POA: Insufficient documentation

## 2017-10-05 DIAGNOSIS — R0602 Shortness of breath: Secondary | ICD-10-CM | POA: Diagnosis present

## 2017-10-05 DIAGNOSIS — N186 End stage renal disease: Secondary | ICD-10-CM | POA: Diagnosis not present

## 2017-10-05 DIAGNOSIS — Z79899 Other long term (current) drug therapy: Secondary | ICD-10-CM | POA: Insufficient documentation

## 2017-10-05 DIAGNOSIS — I120 Hypertensive chronic kidney disease with stage 5 chronic kidney disease or end stage renal disease: Secondary | ICD-10-CM | POA: Insufficient documentation

## 2017-10-05 HISTORY — DX: Malignant (primary) neoplasm, unspecified: C80.1

## 2017-10-05 LAB — BPAM RBC
Blood Product Expiration Date: 201910212359
Blood Product Expiration Date: 201910222359
ISSUE DATE / TIME: 201909190731
ISSUE DATE / TIME: 201909190731
UNIT TYPE AND RH: 7300
Unit Type and Rh: 7300

## 2017-10-05 LAB — TYPE AND SCREEN
ABO/RH(D): B POS
Antibody Screen: NEGATIVE
Unit division: 0
Unit division: 0

## 2017-10-05 LAB — CBC
HEMATOCRIT: 29.9 % — AB (ref 36.0–46.0)
Hemoglobin: 9.7 g/dL — ABNORMAL LOW (ref 12.0–15.0)
MCH: 32.1 pg (ref 26.0–34.0)
MCHC: 32.4 g/dL (ref 30.0–36.0)
MCV: 99 fL (ref 78.0–100.0)
Platelets: 228 10*3/uL (ref 150–400)
RBC: 3.02 MIL/uL — ABNORMAL LOW (ref 3.87–5.11)
RDW: 19.2 % — AB (ref 11.5–15.5)
WBC: 5.5 10*3/uL (ref 4.0–10.5)

## 2017-10-05 LAB — BASIC METABOLIC PANEL
Anion gap: 10 (ref 5–15)
BUN: 28 mg/dL — AB (ref 8–23)
CO2: 30 mmol/L (ref 22–32)
Calcium: 8.5 mg/dL — ABNORMAL LOW (ref 8.9–10.3)
Chloride: 102 mmol/L (ref 98–111)
Creatinine, Ser: 7.8 mg/dL — ABNORMAL HIGH (ref 0.44–1.00)
GFR calc Af Amer: 6 mL/min — ABNORMAL LOW (ref >60)
GFR, EST NON AFRICAN AMERICAN: 5 mL/min — AB (ref >60)
GLUCOSE: 102 mg/dL — AB (ref 70–99)
POTASSIUM: 4.5 mmol/L (ref 3.5–5.1)
SODIUM: 142 mmol/L (ref 135–145)

## 2017-10-05 LAB — I-STAT TROPONIN, ED
Troponin i, poc: 0.01 ng/mL (ref 0.00–0.08)
Troponin i, poc: 0.02 ng/mL (ref 0.00–0.08)

## 2017-10-05 MED ORDER — BENZONATATE 100 MG PO CAPS
200.0000 mg | ORAL_CAPSULE | Freq: Once | ORAL | Status: AC
  Administered 2017-10-05: 200 mg via ORAL
  Filled 2017-10-05: qty 2

## 2017-10-05 MED ORDER — SUCRALFATE 1 GM/10ML PO SUSP
1.0000 g | Freq: Three times a day (TID) | ORAL | Status: DC
  Administered 2017-10-05: 1 g via ORAL
  Filled 2017-10-05 (×2): qty 10

## 2017-10-05 MED ORDER — IOPAMIDOL (ISOVUE-370) INJECTION 76%
100.0000 mL | Freq: Once | INTRAVENOUS | Status: AC | PRN
  Administered 2017-10-05: 80 mL via INTRAVENOUS

## 2017-10-05 NOTE — ED Notes (Signed)
Patient transported to CT 

## 2017-10-05 NOTE — ED Provider Notes (Signed)
Received signout at the beginning of shift.  This is a dialysis patient presenting complaining of shortness of breath.  She appears to be fluid overload.  Her labs are reassuring, CT angiogram of the chest was obtained show no evidence of PE.  Small pericardial effusion improved from prior exam.  Minimal dependent atelectasis were noted.  At this time, patient is stable for discharge and she will follow-up closely with dialysis center for dialysis today which will likely improve her symptoms.  Return precautions discussed.  BP 139/79   Pulse 82   Temp 98 F (36.7 C) (Oral)   Resp 12   Ht 5\' 1"  (1.549 m)   Wt 86.2 kg   SpO2 100%   BMI 35.90 kg/m   Results for orders placed or performed during the hospital encounter of 16/01/09  Basic metabolic panel  Result Value Ref Range   Sodium 142 135 - 145 mmol/L   Potassium 4.5 3.5 - 5.1 mmol/L   Chloride 102 98 - 111 mmol/L   CO2 30 22 - 32 mmol/L   Glucose, Bld 102 (H) 70 - 99 mg/dL   BUN 28 (H) 8 - 23 mg/dL   Creatinine, Ser 7.80 (H) 0.44 - 1.00 mg/dL   Calcium 8.5 (L) 8.9 - 10.3 mg/dL   GFR calc non Af Amer 5 (L) >60 mL/min   GFR calc Af Amer 6 (L) >60 mL/min   Anion gap 10 5 - 15  CBC  Result Value Ref Range   WBC 5.5 4.0 - 10.5 K/uL   RBC 3.02 (L) 3.87 - 5.11 MIL/uL   Hemoglobin 9.7 (L) 12.0 - 15.0 g/dL   HCT 29.9 (L) 36.0 - 46.0 %   MCV 99.0 78.0 - 100.0 fL   MCH 32.1 26.0 - 34.0 pg   MCHC 32.4 30.0 - 36.0 g/dL   RDW 19.2 (H) 11.5 - 15.5 %   Platelets 228 150 - 400 K/uL  I-stat troponin, ED  Result Value Ref Range   Troponin i, poc 0.01 0.00 - 0.08 ng/mL   Comment 3          I-stat troponin, ED  Result Value Ref Range   Troponin i, poc 0.02 0.00 - 0.08 ng/mL   Comment 3           Dg Chest 2 View  Result Date: 10/05/2017 CLINICAL DATA:  Dyspnea with dry cough. EXAM: CHEST - 2 VIEW COMPARISON:  12/29/2016 FINDINGS: Mild cardiomegaly. Nonaneurysmal thoracic aorta. Trace pleural effusions bilaterally. Dextroconvex curvature  of the upper thoracic spine is noted. Dialysis catheter tip from left IJ approach terminates at the cavoatrial junction. Right IJ port catheter is noted also terminating at the cavoatrial junction just proximal to the tip of the dialysis catheter. The lungs are clear. No edema, effusion or pneumothorax. Vascular graft projects over the left axilla. IMPRESSION: Trace bilateral pleural effusions and mild cardiomegaly. No active pulmonary disease. Electronically Signed   By: Ashley Royalty M.D.   On: 10/05/2017 02:42   Ct Angio Chest Pe W And/or Wo Contrast  Result Date: 10/05/2017 CLINICAL DATA:  Shortness of breath for 2 days EXAM: CT ANGIOGRAPHY CHEST WITH CONTRAST TECHNIQUE: Multidetector CT imaging of the chest was performed using the standard protocol during bolus administration of intravenous contrast. Multiplanar CT image reconstructions and MIPs were obtained to evaluate the vascular anatomy. CONTRAST:  54mL ISOVUE-370 IOPAMIDOL (ISOVUE-370) INJECTION 76% COMPARISON:  03/22/2017. FINDINGS: Cardiovascular: Thoracic aorta demonstrates some atherosclerotic calcifications. No aneurysmal dilatation or dissection is  seen. Mild cardiac enlargement is noted with minimal pericardial effusion. The pulmonary artery shows a normal branching pattern. No filling defects to suggest pulmonary emboli are identified. Right chest wall port and left dialysis catheter are noted in satisfactory position. Mediastinum/Nodes: Thoracic inlet is within normal limits. No mediastinal or hilar adenopathy is noted. The esophagus is within normal limits with the exception of a small sliding-type hiatal hernia. Lungs/Pleura: Lungs are well aerated bilaterally. Minimal dependent atelectatic changes are noted. No focal infiltrate or sizable effusion is seen. No parenchymal nodules are noted. Upper Abdomen: The upper portion of the right kidney demonstrates multiple cystic changes similar to that seen on prior CT examination. Musculoskeletal:  Degenerative changes of the thoracic spine are noted. No acute bony abnormality is seen. Review of the MIP images confirms the above findings. IMPRESSION: No evidence of pulmonary emboli. Minimal dependent atelectasis. Small pericardial effusion.  This is improved from the prior exam. Aortic Atherosclerosis (ICD10-I70.0). Electronically Signed   By: Inez Catalina M.D.   On: 10/05/2017 07:36      Domenic Moras, PA-C 10/05/17 [id number]    Jola Schmidt, MD 10/06/17 2044

## 2017-10-05 NOTE — ED Notes (Signed)
Pt returned from CT °

## 2017-10-05 NOTE — Discharge Instructions (Signed)
Please go straight to your dialysis center for dialysis as it will help with your shortness of breath.

## 2017-10-05 NOTE — ED Notes (Signed)
Patient transported to X-ray 

## 2017-10-05 NOTE — ED Triage Notes (Signed)
Patient reports SOB with dry cough worse with exertion / lying flat onset this week , hemodialysis q Mon/Wed/Fri , she did not complete her treatment last Wednesday , pt. added blood transfusion yesterday at [facility] . Denies fever or chills .

## 2017-10-05 NOTE — ED Provider Notes (Signed)
[facility] EMERGENCY DEPARTMENT Provider Note   CSN: [id number] Arrival date & time: 10/05/17  0210     History   Chief Complaint Chief Complaint  Patient presents with  . Shortness of Breath    HPI Karen [name] is a 62 y.o. female.  The history is provided by the patient.  Shortness of Breath  This is a recurrent problem. The problem occurs continuously.The current episode started 6 to 12 hours ago. The problem has not changed since onset.Associated symptoms include cough and orthopnea. Pertinent negatives include no fever, no coryza, no rhinorrhea, no sore throat, no swollen glands, no neck pain, no sputum production, no syncope, no vomiting, no abdominal pain, no rash, no leg pain and no leg swelling. The problem's precipitants include medical treatment. She has tried nothing for the symptoms. The treatment provided no relief. She has had no prior hospitalizations. She has had prior ED visits. Associated medical issues do not include PE or heart failure.    Past Medical History:  Diagnosis Date  . Anemia   . Arthritis    knees  . Cancer ([place])   . Chest pain Jan 2016   low risk Myoview   . Complication of anesthesia 2005   difficulty remembering for a while  . Constipation   . ESRD on dialysis [facility]) [place] 2016   MWF  . GERD (gastroesophageal reflux disease)   . Heart murmur Nov 2015   Aortic scleosis- no stenosis  . Hemodialysis patient ([place])   . Hypertension   . Shortness of breath dyspnea    with exertion    Patient Active Problem List   Diagnosis Date Noted  . Port-A-Cath in place 07/17/2017  . Malignant neoplasm of upper-outer quadrant of right breast in female, estrogen receptor negative ([place]) 07/02/2017  . Cough 03/06/2017  . Elevated troponin I level 06/07/2015  . Paroxysmal atrial fibrillation ([place]) 06/07/2015  . Klebsiella pneumoniae sepsis ([place]) 12/22/2014  . Sinus tachycardia 12/21/2014  . Dyspnea 12/21/2014  . Infection of  urinary tract 12/20/2014  . Sepsis ([place]) 12/19/2014  . Fever, unspecified 12/17/2014  . Thrush 12/17/2014  . Erythema induratum 12/17/2014  . Obesity, Class II, BMI 35.0-39.9, with comorbidity (see actual BMI) 11/19/2014  . Bilateral lower extremity edema 04/28/2014  . Chronic constipation 05/26/2013  . Dyspepsia 08/08/2012  . Chronic nausea 08/08/2012  . History of chest pain 06/12/2012  . End stage renal disease on dialysis [facility]) 07/04/2011    Past Surgical History:  Procedure Laterality Date  . ABDOMINAL HYSTERECTOMY  2005  . AV FISTULA PLACEMENT Left 12/09/2013   Procedure: INSERTION OF ARTERIOVENOUS (AV) GORE-TEX GRAFT ARM;  Surgeon: Elam Dutch, MD;  Location: [facility];  Service: Vascular;  Laterality: Left;  . BREAST BIOPSY    . BUNIONECTOMY Bilateral   . CHOLECYSTECTOMY  2005  . COLONOSCOPY    . IR GENERIC HISTORICAL  01/11/2016   IR US GUIDE VASC ACCESS RIGHT 01/11/2016 Corrie Mckusick, DO [facility]-INTERV RAD  . IR GENERIC HISTORICAL  01/11/2016   IR RADIOLOGY PERIPHERAL GUIDED IV START 01/11/2016 Corrie Mckusick, DO [facility]INTER[facility]  . IR IMAGING GUIDED PORT INSERTION  07/05/2017  . 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 KNEE ARTHROSCOPY Bilateral   . PERIPHERAL VASCULAR CATHETERIZATION N/A 08/31/2015   Procedure: A/V Shuntogram;  Surgeon: Serafina Mitchell, MD;  Location: [facility] CV LAB;  Service: Cardiovascular;  Laterality: N/A;  . PERIPHERAL VASCULAR CATHETERIZATION Left 08/31/2015   Procedure: Peripheral Vascular  Balloon Angioplasty;  Surgeon: Serafina Mitchell, MD;  Location: [facility] CV LAB;  Service: Cardiovascular;  Laterality: Left;  arm fistula  . ROTATOR CUFF REPAIR Right 1997     OB History   None      Home Medications    Prior to Admission medications   Medication Sig Start Date End Date Taking? Authorizing Provider  acetaminophen (TYLENOL) 500 MG tablet Take 1,000 mg by mouth daily as needed for mild pain, moderate pain or headache.    Yes [provider]  amoxicillin (AMOXIL) 500 MG capsule Take 2,000 mg by mouth See admin instructions. Take 4 capsules (2000 mg) by mouth one hour prior to dental procedures   Yes [provider]  aspirin EC 81 MG tablet Take 81 mg by mouth daily.   Yes [provider]  Darbepoetin Alfa-Albumin (ARANESP IJ) Inject as directed every 30 (thirty) days.   Yes [provider]  diltiazem (CARDIZEM) 30 MG tablet Take 1/2 tablet the morning of dialysis Patient taking differently: Take 15 mg by mouth See admin instructions. Take 1/2 tablet the morning of dialysis 05/17/17  Yes Sherran Needs, NP  lidocaine-prilocaine (EMLA) cream Apply 1 application topically See admin instructions. Apply topically Monday, Wednesday and Friday before dialysis 05/26/15  Yes [provider]  multivitamin (RENA-VIT) TABS tablet Take 1 tablet by mouth daily.   Yes [provider]  ondansetron (ZOFRAN) 8 MG tablet Take 1 tablet (8 mg total) by mouth 2 (two) times daily as needed (Nausea or vomiting). 07/02/17  Yes Nicholas Lose, MD  pantoprazole (PROTONIX) 40 MG tablet TAKE 1 TABLET (40 MG TOTAL) BY MOUTH DAILY. Patient taking differently: Take 40 mg by mouth daily.  07/23/17  Yes Lorretta Harp, MD  prochlorperazine (COMPAZINE) 10 MG tablet Take 1 tablet (10 mg total) by mouth every 6 (six) hours as needed (Nausea or vomiting). 07/02/17  Yes Nicholas Lose, MD  sevelamer carbonate (RENVELA) 800 MG tablet Take 800-2,400 mg by mouth See admin instructions. 2,400mg  three times daily and 800mg  twice daily with snacks   Yes [provider]  simvastatin (ZOCOR) 20 MG tablet Take 1 tablet (20 mg total) by mouth at bedtime. 11/16/16 10/05/25 Yes KilroyDoreene Burke, PA-C    Family History Family History  Problem Relation Age of Onset  . Kidney disease Mother   . Heart attack Father   . Kidney disease Father   . Diabetes Sister   . Hyperlipidemia Sister   . Hypertension Sister   . Kidney  disease Sister        x2    Social History Social History   Tobacco Use  . Smoking status: Never Smoker  . Smokeless tobacco: Never Used  Substance Use Topics  . Alcohol use: No  . Drug use: No     Allergies   Patient has no known allergies.   Review of Systems Review of Systems  Constitutional: Negative for diaphoresis and fever.  HENT: Negative for rhinorrhea and sore throat.   Respiratory: Positive for cough and shortness of breath. Negative for sputum production.   Cardiovascular: Positive for orthopnea. Negative for leg swelling and syncope.  Gastrointestinal: Negative for abdominal pain and vomiting.  Musculoskeletal: Negative for neck pain.  Skin: Negative for rash.  All other systems reviewed and are negative.    Physical Exam Updated Vital Signs BP (!) 142/90   Pulse 74   Temp 98 F (36.7 C) (Oral)   Resp 12  Ht 5\' 1"  (1.549 m)   Wt 86.2 kg   SpO2 98%   BMI 35.90 kg/m   Physical Exam  Constitutional: She is oriented to person, place, and time. She appears well-developed and well-nourished. No distress.  HENT:  Head: Normocephalic and atraumatic.  Mouth/Throat: No oropharyngeal exudate.  Eyes: Pupils are equal, round, and reactive to light. Conjunctivae are normal.  Neck: Normal range of motion. Neck supple.  Cardiovascular: Normal rate, regular rhythm, normal heart sounds and intact distal pulses.  Pulmonary/Chest: Effort normal. No stridor. No respiratory distress. She has no wheezes. She has no rales.  Abdominal: Soft. Bowel sounds are normal. She exhibits no mass. There is no tenderness. There is no rebound and no guarding.  Musculoskeletal: Normal range of motion.  Neurological: She is alert and oriented to person, place, and time. She displays normal reflexes.  Skin: Skin is warm and dry. Capillary refill takes less than 2 seconds.  Psychiatric: She has a normal mood and affect.     ED Treatments / Results  Labs (all labs ordered are  listed, but only abnormal results are displayed) Results for orders placed or performed during the hospital encounter of 81/19/14  Basic metabolic panel  Result Value Ref Range   Sodium 142 135 - 145 mmol/L   Potassium 4.5 3.5 - 5.1 mmol/L   Chloride 102 98 - 111 mmol/L   CO2 30 22 - 32 mmol/L   Glucose, Bld 102 (H) 70 - 99 mg/dL   BUN 28 (H) 8 - 23 mg/dL   Creatinine, Ser 7.80 (H) 0.44 - 1.00 mg/dL   Calcium 8.5 (L) 8.9 - 10.3 mg/dL   GFR calc non Af Amer 5 (L) >60 mL/min   GFR calc Af Amer 6 (L) >60 mL/min   Anion gap 10 5 - 15  CBC  Result Value Ref Range   WBC 5.5 4.0 - 10.5 K/uL   RBC 3.02 (L) 3.87 - 5.11 MIL/uL   Hemoglobin 9.7 (L) 12.0 - 15.0 g/dL   HCT 29.9 (L) 36.0 - 46.0 %   MCV 99.0 78.0 - 100.0 fL   MCH 32.1 26.0 - 34.0 pg   MCHC 32.4 30.0 - 36.0 g/dL   RDW 19.2 (H) 11.5 - 15.5 %   Platelets 228 150 - 400 K/uL  I-stat troponin, ED  Result Value Ref Range   Troponin i, poc 0.01 0.00 - 0.08 ng/mL   Comment 3          I-stat troponin, ED  Result Value Ref Range   Troponin i, poc 0.02 0.00 - 0.08 ng/mL   Comment 3           Dg Chest 2 View  Result Date: 10/05/2017 CLINICAL DATA:  Dyspnea with dry cough. EXAM: CHEST - 2 VIEW COMPARISON:  12/29/2016 FINDINGS: Mild cardiomegaly. Nonaneurysmal thoracic aorta. Trace pleural effusions bilaterally. Dextroconvex curvature of the upper thoracic spine is noted. Dialysis catheter tip from left IJ approach terminates at the cavoatrial junction. Right IJ port catheter is noted also terminating at the cavoatrial junction just proximal to the tip of the dialysis catheter. The lungs are clear. No edema, effusion or pneumothorax. Vascular graft projects over the left axilla. IMPRESSION: Trace bilateral pleural effusions and mild cardiomegaly. No active pulmonary disease. Electronically Signed   By: Ashley Royalty M.D.   On: 10/05/2017 02:42    EKG EKG Interpretation  Date/Time:  Friday October 05 2017 02:23:28 EDT Ventricular Rate:    82 PR Interval:  144 QRS Duration: 82 QT Interval:  386 QTC Calculation: 450 R Axis:     Text Interpretation:  Normal sinus rhythm Minimal voltage criteria for LVH, may be normal variant Borderline ECG When compared with ECG of 08/23/2017, No significant change was found Confirmed by Delora Fuel ([id number]) on 10/05/2017 3:24:13 AM   Radiology Dg Chest 2 View  Result Date: 10/05/2017 CLINICAL DATA:  Dyspnea with dry cough. EXAM: CHEST - 2 VIEW COMPARISON:  12/29/2016 FINDINGS: Mild cardiomegaly. Nonaneurysmal thoracic aorta. Trace pleural effusions bilaterally. Dextroconvex curvature of the upper thoracic spine is noted. Dialysis catheter tip from left IJ approach terminates at the cavoatrial junction. Right IJ port catheter is noted also terminating at the cavoatrial junction just proximal to the tip of the dialysis catheter. The lungs are clear. No edema, effusion or pneumothorax. Vascular graft projects over the left axilla. IMPRESSION: Trace bilateral pleural effusions and mild cardiomegaly. No active pulmonary disease. Electronically Signed   By: Ashley Royalty M.D.   On: 10/05/2017 02:42    Procedures Procedures (including critical care time)  Medications Ordered in ED Medications  benzonatate (TESSALON) capsule 200 mg (has no administration in time range)  sucralfate (CARAFATE) 1 GM/10ML suspension 1 g (has no administration in time range)      Final Clinical Impressions(s) / ED Diagnoses   Signed out pending CTA, if negative can be discharged to dialysis.      Jaynell Castagnola, MD 10/05/17 [phone]

## 2017-10-09 ENCOUNTER — Inpatient Hospital Stay: Payer: Medicare Other

## 2017-10-09 ENCOUNTER — Inpatient Hospital Stay (HOSPITAL_BASED_OUTPATIENT_CLINIC_OR_DEPARTMENT_OTHER): Payer: Medicare Other | Admitting: Hematology and Oncology

## 2017-10-09 ENCOUNTER — Encounter: Payer: Self-pay | Admitting: *Deleted

## 2017-10-09 VITALS — BP 136/75 | HR 87 | Temp 98.7°F | Resp 17 | Ht 61.0 in | Wt 194.9 lb

## 2017-10-09 DIAGNOSIS — Z171 Estrogen receptor negative status [ER-]: Principal | ICD-10-CM

## 2017-10-09 DIAGNOSIS — Z992 Dependence on renal dialysis: Secondary | ICD-10-CM

## 2017-10-09 DIAGNOSIS — Z95828 Presence of other vascular implants and grafts: Secondary | ICD-10-CM

## 2017-10-09 DIAGNOSIS — C50411 Malignant neoplasm of upper-outer quadrant of right female breast: Secondary | ICD-10-CM

## 2017-10-09 DIAGNOSIS — I129 Hypertensive chronic kidney disease with stage 1 through stage 4 chronic kidney disease, or unspecified chronic kidney disease: Secondary | ICD-10-CM

## 2017-10-09 DIAGNOSIS — G629 Polyneuropathy, unspecified: Secondary | ICD-10-CM

## 2017-10-09 DIAGNOSIS — D631 Anemia in chronic kidney disease: Secondary | ICD-10-CM

## 2017-10-09 DIAGNOSIS — Z79899 Other long term (current) drug therapy: Secondary | ICD-10-CM

## 2017-10-09 DIAGNOSIS — N189 Chronic kidney disease, unspecified: Secondary | ICD-10-CM

## 2017-10-09 DIAGNOSIS — Z5111 Encounter for antineoplastic chemotherapy: Secondary | ICD-10-CM | POA: Diagnosis not present

## 2017-10-09 LAB — CMP (CANCER CENTER ONLY)
ALBUMIN: 3.2 g/dL — AB (ref 3.5–5.0)
ALK PHOS: 194 U/L — AB (ref 38–126)
ALT: 10 U/L (ref 0–44)
AST: 14 U/L — AB (ref 15–41)
Anion gap: 10 (ref 5–15)
BUN: 22 mg/dL (ref 8–23)
CALCIUM: 8.3 mg/dL — AB (ref 8.9–10.3)
CO2: 29 mmol/L (ref 22–32)
Chloride: 102 mmol/L (ref 98–111)
Creatinine: 7.25 mg/dL (ref 0.44–1.00)
GFR, Est AFR Am: 6 mL/min — ABNORMAL LOW (ref >60)
GFR, Estimated: 5 mL/min — ABNORMAL LOW (ref >60)
GLUCOSE: 130 mg/dL — AB (ref 70–99)
Potassium: 4.3 mmol/L (ref 3.5–5.1)
Sodium: 141 mmol/L (ref 135–145)
Total Bilirubin: 0.3 mg/dL (ref 0.3–1.2)
Total Protein: 6.6 g/dL (ref 6.5–8.1)

## 2017-10-09 LAB — CBC WITH DIFFERENTIAL (CANCER CENTER ONLY)
BASOS PCT: 1 %
Basophils Absolute: 0.1 10*3/uL (ref 0.0–0.1)
EOS PCT: 3 %
Eosinophils Absolute: 0.2 10*3/uL (ref 0.0–0.5)
HCT: 30.5 % — ABNORMAL LOW (ref 34.8–46.6)
Hemoglobin: 9.7 g/dL — ABNORMAL LOW (ref 11.6–15.9)
LYMPHS PCT: 12 %
Lymphs Abs: 1 10*3/uL (ref 0.9–3.3)
MCH: 31.6 pg (ref 25.1–34.0)
MCHC: 31.8 g/dL (ref 31.5–36.0)
MCV: 99.3 fL (ref 79.5–101.0)
MONO ABS: 0.5 10*3/uL (ref 0.1–0.9)
Monocytes Relative: 6 %
Neutro Abs: 6.7 10*3/uL — ABNORMAL HIGH (ref 1.5–6.5)
Neutrophils Relative %: 78 %
PLATELETS: 190 10*3/uL (ref 145–400)
RBC: 3.07 MIL/uL — ABNORMAL LOW (ref 3.70–5.45)
RDW: 17.6 % — AB (ref 11.2–14.5)
WBC Count: 8.6 10*3/uL (ref 3.9–10.3)

## 2017-10-09 MED ORDER — SODIUM CHLORIDE 0.9% FLUSH
10.0000 mL | INTRAVENOUS | Status: DC | PRN
  Administered 2017-10-09: 10 mL
  Filled 2017-10-09: qty 10

## 2017-10-09 MED ORDER — HEPARIN SOD (PORK) LOCK FLUSH 100 UNIT/ML IV SOLN
500.0000 [IU] | Freq: Once | INTRAVENOUS | Status: AC | PRN
  Administered 2017-10-09: 500 [IU]
  Filled 2017-10-09: qty 5

## 2017-10-09 MED ORDER — AZITHROMYCIN 250 MG PO TABS: ORAL_TABLET | ORAL | 0 refills | Status: DC

## 2017-10-09 NOTE — Progress Notes (Signed)
Patient Care Team: Lucianne Lei, MD as PCP - General (Family Medicine) Lorretta Harp, MD as PCP - Cardiology (Cardiology) Donato Heinz, MD (Nephrology) Lorretta Harp, MD as Consulting Physician (Cardiology) Pyrtle, Lajuan Lines, MD as Consulting Physician (Gastroenterology)  DIAGNOSIS:  Encounter Diagnosis  Name Primary?  . Malignant neoplasm of upper-outer quadrant of right breast in female, estrogen receptor negative ([place])     SUMMARY OF ONCOLOGIC HISTORY:   Malignant neoplasm of upper-outer quadrant of right breast in female, estrogen receptor negative ([place])   06/19/2017 Initial Diagnosis    Right breast skin thickening and heaviness: Screening mammogram detected 2 cm of microcalcifications in the right breast in the posterior third UOQ.?  Skin involvement, stereotactic biopsy revealed IDC grade 2-3 with high-grade DCIS, lymphovascular invasion present, ER 0%, PR 0%, HER-2 positive ratio 8.69, gene copy #11.3 T1CN0 stage Ia    07/02/2017 Cancer Staging    Staging form: Breast, AJCC 8th Edition - Clinical: Stage IA (cT1c, cN0, cM0, G3, ER-, PR-, HER2+) - Signed by Nicholas Lose, MD on 07/02/2017    07/17/2017 -  Chemotherapy    Taxol Herceptin weekly x12 followed by Herceptin maintenance x1 year     CHIEF COMPLIANT: Follow-up on Taxol Herceptin, patient is feeling extremely poor with fatigue and upper respiratory tract infection  INTERVAL HISTORY: Karen [name] is a 62 year old with above-mentioned history of right breast cancer currently on neoadjuvant chemotherapy with Taxol and Herceptin.  Today is cycle 10.  Unfortunately she does not feel well today.  She denies any fevers or chills but she has a cold cough and feels like she can go to bed and sleep.  She has been eating and drinking adequately.  She does have neuropathy.  REVIEW OF SYSTEMS:   Constitutional: Generalized fatigue and weakness Eyes: Denies blurriness of vision Ears, nose, mouth, throat, and face: Denies  mucositis or sore throat Respiratory: Denies cough, dyspnea or wheezes Cardiovascular: Denies palpitation, chest discomfort Gastrointestinal:  Denies nausea, heartburn or change in bowel habits Skin: Denies abnormal skin rashes Lymphatics: Denies new lymphadenopathy or easy bruising Neurological: Nail discoloration and peripheral neuropathy grade 1 Behavioral/Psych: Mood is stable, no new changes  Extremities: No lower extremity edema Breast:  denies any pain or lumps or nodules in either breasts All other systems were reviewed with the patient and are negative.  I have reviewed the past medical history, past surgical history, social history and family history with the patient and they are unchanged from previous note.  ALLERGIES:  has No Known Allergies.  MEDICATIONS:  Current Outpatient Medications  Medication Sig Dispense Refill  . acetaminophen (TYLENOL) 500 MG tablet Take 1,000 mg by mouth daily as needed for mild pain, moderate pain or headache.     Marland Kitchen amoxicillin (AMOXIL) 500 MG capsule Take 2,000 mg by mouth See admin instructions. Take 4 capsules (2000 mg) by mouth one hour prior to dental procedures    . aspirin EC 81 MG tablet Take 81 mg by mouth daily.    . Darbepoetin Alfa-Albumin (ARANESP IJ) Inject as directed every 30 (thirty) days.    Marland Kitchen diltiazem (CARDIZEM) 30 MG tablet Take 1/2 tablet the morning of dialysis (Patient taking differently: Take 15 mg by mouth See admin instructions. Take 1/2 tablet the morning of dialysis) 45 tablet 1  . lidocaine-prilocaine (EMLA) cream Apply 1 application topically See admin instructions. Apply topically Monday, Wednesday and Friday before dialysis  6  . multivitamin (RENA-VIT) TABS tablet Take 1 tablet by mouth daily.    Marland Kitchen  ondansetron (ZOFRAN) 8 MG tablet Take 1 tablet (8 mg total) by mouth 2 (two) times daily as needed (Nausea or vomiting). 30 tablet 1  . pantoprazole (PROTONIX) 40 MG tablet TAKE 1 TABLET (40 MG TOTAL) BY MOUTH DAILY.  (Patient taking differently: Take 40 mg by mouth daily. ) 90 tablet 1  . prochlorperazine (COMPAZINE) 10 MG tablet Take 1 tablet (10 mg total) by mouth every 6 (six) hours as needed (Nausea or vomiting). 30 tablet 1  . sevelamer carbonate (RENVELA) 800 MG tablet Take 800-2,400 mg by mouth See admin instructions. 2,45m three times daily and 8011mtwice daily with snacks    . simvastatin (ZOCOR) 20 MG tablet Take 1 tablet (20 mg total) by mouth at bedtime. 30 tablet 11   No current facility-administered medications for this visit.     PHYSICAL EXAMINATION: ECOG PERFORMANCE STATUS: 2  Vitals:   10/09/17 1419  BP: 136/75  Pulse: 87  Resp: 17  Temp: 98.7 F (37.1 C)  SpO2: 100%   Filed Weights   10/09/17 1419  Weight: 194 lb 14.4 oz (88.4 kg)    GENERAL:alert, no distress and comfortable SKIN: skin color, texture, turgor are normal, no rashes or significant lesions EYES: normal, Conjunctiva are pink and non-injected, sclera clear OROPHARYNX:no exudate, no erythema and lips, buccal mucosa, and tongue normal  NECK: supple, thyroid normal size, non-tender, without nodularity LYMPH:  no palpable lymphadenopathy in the cervical, axillary or inguinal LUNGS: clear to auscultation and percussion with normal breathing effort HEART: regular rate & rhythm and no murmurs and no lower extremity edema ABDOMEN:abdomen soft, non-tender and normal bowel sounds MUSCULOSKELETAL:no cyanosis of digits and no clubbing  NEURO: alert & oriented x 3 with fluent speech, peripheral neuropathy, nail discoloration EXTREMITIES: No lower extremity edema   LABORATORY DATA:  I have reviewed the data as listed CMP Latest Ref Rng & Units 10/09/2017 10/05/2017 10/02/2017  Glucose 70 - 99 mg/dL 130(H) 102(H) 115(H)  BUN 8 - 23 mg/dL 22 28(H) 22  Creatinine 0.44 - 1.00 mg/dL 7.25(HH) 7.80(H) 6.39(HH)  Sodium 135 - 145 mmol/L 141 142 144  Potassium 3.5 - 5.1 mmol/L 4.3 4.5 4.7  Chloride 98 - 111 mmol/L 102 102 103    CO2 22 - 32 mmol/L 29 30 33(H)  Calcium 8.9 - 10.3 mg/dL 8.3(L) 8.5(L) 8.9  Total Protein 6.5 - 8.1 g/dL 6.6 - 6.2(L)  Total Bilirubin 0.3 - 1.2 mg/dL 0.3 - 0.4  Alkaline Phos 38 - 126 U/L 194(H) - 152(H)  AST 15 - 41 U/L 14(L) - 16  ALT 0 - 44 U/L 10 - 14    Lab Results  Component Value Date   WBC 8.6 10/09/2017   HGB 9.7 (L) 10/09/2017   HCT 30.5 (L) 10/09/2017   MCV 99.3 10/09/2017   PLT 190 10/09/2017   NEUTROABS 6.7 (H) 10/09/2017    ASSESSMENT & PLAN:  Malignant neoplasm of upper-outer quadrant of right breast in female, estrogen receptor negative [place]: Right breast skin thickening and heaviness: Screening mammogram detected 2 cm of microcalcifications in the right breast in the posterior third UOQ.? 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 neoadjuvant chemotherapy with Taxol Herceptin weekly x12 followed by Herceptin maintenance for 1 year 2. Followed by surgery with either lumpectomy or mastectomy depending on the MRI findings and treatment response 3. Followed by adjuvant radiation if she  undergoes breast conserving surgery 4. Followed by adjuvant antiestrogen therapy  -------------------------------------------------------------------------------- Current treatment: Cycle10day 1 Taxol Herceptin(with Granix support): Treatment being held today because of upper respiratory infection Labs have been reviewed  Chemo Toxicities: 1.Neutropenia: With Granix being given on the Saturday prior to treatment, counts are remaining stable 2.mild fatigue 3.Chemotherapy-induced anemia on top of anemia due to chronic kidney disease being monitored closely, today's hemoglobin is  She received blood transfusion previously. 4.Diarrhea for which she takes Imodium 5. Peripheral neuropathy grade 1  Upper  respiratory tract infection: IV prescribed her azithromycin. We will hold off on doing chemotherapy today. I doubt that she will be able to receive any further chemotherapy based on her neuropathy as well as the fatigue  Mammogram and ultrasound of been scheduled for 10/18/2017 I requested a follow-up with Dr. Ninfa Linden to discuss surgical options. She will be presented in the tumor board to review her imaging after the mammograms.  I will see her back next week to review the treatment plan.     No orders of the defined types were placed in this encounter.  The patient has a good understanding of the overall plan. she agrees with it. she will call with any problems that may develop before the next visit here.   Harriette Ohara, MD 10/09/17

## 2017-10-09 NOTE — Assessment & Plan Note (Signed)
06/19/2017: Right breast skin thickening and heaviness: Screening mammogram detected 2 cm of microcalcifications in the right breast in the posterior third UOQ.? 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 neoadjuvant chemotherapy with Taxol Herceptin weekly x12 followed by Herceptin maintenance for 1 year 2. Followed by surgery with either lumpectomy or mastectomy depending on the MRI findings and treatment response 3. Followed by adjuvant radiation if she undergoes breast conserving surgery 4. Followed by adjuvant antiestrogen therapy  -------------------------------------------------------------------------------- Current treatment: Cycle10day 1 Taxol Herceptin(with Granix support) Labs have been reviewed  Chemo Toxicities: 1.Neutropenia: With Granix being given on the Saturday prior to treatment, counts are remaining stable 2.mild fatigue 3.Chemotherapy-induced anemia on top of anemia due to chronic kidney disease being monitored closely, today's hemoglobin is  She received blood transfusion previously. 4.Diarrhea for which she takes Imodium 5. Peripheral neuropathy grade 1  Mammogram and ultrasound of been scheduled for 10/18/2017 I requested a follow-up with Dr. Ninfa Linden to discuss surgical options. She will be presented in the tumor board to review her imaging after the mammograms.  Return to clinic after surgery.

## 2017-10-10 NOTE — Progress Notes (Deleted)
Cardiology Office Note   Date:  10/10/2017   ID:  Karen [name], DOB November 18, 1955, MRN [id number]  PCP:  Lucianne Lei, MD  Cardiologist:  Dr.Berry EP: Roderic Palau NP and Dr. Rayann Heman.  No chief complaint on file.    History of Present Illness: Karen [name] is a 62 y.o. female who presents for ongoing assessment and management of atrial fib, hypertension, chest pain, and hypercholesterolemia. She is followed in the Afib clinic by Roderic Palau, NP. Other history includes ESRD on dialysis, failed kidney transplant.  She some hypotension on dialysis days. She has been offered ablation but she has declined. She is not on anticoagulation, CHADS VASC Score of 1.   She has recently been diagnosed with right breast cancer and is now undergoing chemo.     Past Medical History:  Diagnosis Date  . Anemia   . Arthritis    knees  . Cancer ([place])   . Chest pain Jan 2016   low risk Myoview   . Complication of anesthesia 2005   difficulty remembering for a while  . Constipation   . ESRD on dialysis [facility]) April 2016   MWF  . GERD (gastroesophageal reflux disease)   . Heart murmur Nov 2015   Aortic scleosis- no stenosis  . Hemodialysis patient ([place])   . Hypertension   . Shortness of breath dyspnea    with exertion    Past Surgical History:  Procedure Laterality Date  . ABDOMINAL HYSTERECTOMY  2005  . AV FISTULA PLACEMENT Left 12/09/2013   Procedure: INSERTION OF ARTERIOVENOUS (AV) GORE-TEX GRAFT ARM;  Surgeon: Elam Dutch, MD;  Location: [facility];  Service: Vascular;  Laterality: Left;  . BREAST BIOPSY    . BUNIONECTOMY Bilateral   . CHOLECYSTECTOMY  2005  . COLONOSCOPY    . IR GENERIC HISTORICAL  01/11/2016   IR US GUIDE VASC ACCESS RIGHT 01/11/2016 Corrie Mckusick, DO [facility]  . IR GENERIC HISTORICAL  01/11/2016   IR RADIOLOGY PERIPHERAL GUIDED IV START 01/11/2016 Corrie Mckusick, DO [facility]  . IR IMAGING GUIDED PORT INSERTION  07/05/2017  . 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     failed  . KNEE ARTHROSCOPY Bilateral   . PERIPHERAL VASCULAR CATHETERIZATION N/A 08/31/2015   Procedure: A/V Shuntogram;  Surgeon: Serafina Mitchell, MD;  Location: [facility];  Service: Cardiovascular;  Laterality: N/A;  . PERIPHERAL VASCULAR CATHETERIZATION Left 08/31/2015   Procedure: Peripheral Vascular Balloon Angioplasty;  Surgeon: Serafina Mitchell, MD;  Location: [facility];  Service: Cardiovascular;  Laterality: Left;  arm fistula  . ROTATOR CUFF REPAIR Right 1997     Current Outpatient Medications  Medication Sig Dispense Refill  . acetaminophen (TYLENOL) 500 MG tablet Take 1,000 mg by mouth daily as needed for mild pain, moderate pain or headache.     Marland Kitchen amoxicillin (AMOXIL) 500 MG capsule Take 2,000 mg by mouth See admin instructions. Take 4 capsules (2000 mg) by mouth one hour prior to dental procedures    . aspirin EC 81 MG tablet Take 81 mg by mouth daily.    Marland Kitchen azithromycin (ZITHROMAX Z-PAK) 250 MG tablet Use as directed 6 each 0  . Darbepoetin Alfa-Albumin (ARANESP IJ) Inject as directed every 30 (thirty) days.    Marland Kitchen diltiazem (CARDIZEM) 30 MG tablet Take 1/2 tablet the morning of dialysis (Patient taking differently: Take 15 mg by mouth See admin instructions. Take 1/2 tablet the morning of dialysis) 45 tablet 1  . lidocaine-prilocaine (EMLA) cream  Apply 1 application topically See admin instructions. Apply topically Monday, Wednesday and Friday before dialysis  6  . multivitamin (RENA-VIT) TABS tablet Take 1 tablet by mouth daily.    . ondansetron (ZOFRAN) 8 MG tablet Take 1 tablet (8 mg total) by mouth 2 (two) times daily as needed (Nausea or vomiting). 30 tablet 1  . pantoprazole (PROTONIX) 40 MG tablet TAKE 1 TABLET (40 MG TOTAL) BY MOUTH DAILY. (Patient taking differently: Take 40 mg by mouth daily. ) 90 tablet 1  . prochlorperazine (COMPAZINE) 10 MG tablet Take 1 tablet (10 mg total) by mouth every 6 (six) hours as needed (Nausea or vomiting). 30 tablet 1  .  sevelamer carbonate (RENVELA) 800 MG tablet Take 800-2,400 mg by mouth See admin instructions. 2,400mg  three times daily and 800mg  twice daily with snacks    . simvastatin (ZOCOR) 20 MG tablet Take 1 tablet (20 mg total) by mouth at bedtime. 30 tablet 11   No current facility-administered medications for this visit.     Allergies:   Patient has no known allergies.    Social History:  The patient  reports that she has never smoked. She has never used smokeless tobacco. She reports that she does not drink alcohol or use drugs.   Family History:  The patient's family history includes Diabetes in her sister; Heart attack in her father; Hyperlipidemia in her sister; Hypertension in her sister; Kidney disease in her father, mother, and sister.    ROS: All other systems are reviewed and negative. Unless otherwise mentioned in H&P    PHYSICAL EXAM: VS:  There were no vitals taken for this visit. , BMI There is no height or weight on file to calculate BMI. GEN: Well nourished, well developed, in no acute distress HEENT: normal Neck: no JVD, carotid bruits, or masses Cardiac: ***RRR; no murmurs, rubs, or gallops,no edema  Respiratory:  Clear to auscultation bilaterally, normal work of breathing GI: soft, nontender, nondistended, + BS MS: no deformity or atrophy Skin: warm and dry, no rash Neuro:  Strength and sensation are intact Psych: euthymic mood, full affect   EKG:  EKG {ACTION; IS/IS [id number]} ordered today. The ekg ordered today demonstrates ***   Recent Labs: 10/09/2017: ALT 10; BUN 22; Creatinine 7.25; Hemoglobin 9.7; Platelet Count 190; Potassium 4.3; Sodium 141    Lipid Panel    Component Value Date/Time   CHOL 165 06/08/2015 0704   TRIG 44 06/08/2015 0704   HDL 59 06/08/2015 0704   CHOLHDL 2.8 06/08/2015 0704   VLDL 9 06/08/2015 0704   LDLCALC 97 06/08/2015 0704      Wt Readings from Last 3 Encounters:  10/09/17 194 lb 14.4 oz (88.4 kg)  10/05/17 190 lb (86.2  kg)  09/25/17 194 lb (88 kg)      Other studies Reviewed: Additional studies/ records that were reviewed today include: ***. Review of the above records demonstrates: ***   ASSESSMENT AND PLAN:  1.  ***   Current medicines are reviewed at length with the patient today.    Labs/ tests ordered today include: *** Phill Myron. [place] Pugh, ANP, AACC   10/10/2017 1:22 PM    [address] Office [phone] Fax [phone]

## 2017-10-11 ENCOUNTER — Ambulatory Visit: Payer: Medicare Other | Admitting: Adult Health

## 2017-10-15 NOTE — Progress Notes (Signed)
Cardiology Office Note   Date:  10/18/2017   ID:  Karen [name], DOB 07-Apr-1955, MRN [id number]  PCP:  Lucianne Lei, MD  Cardiologist: [facility]  Chief Complaint  Patient presents with  . Atrial Fibrillation     History of Present Illness: Karen [name] is a 62 y.o. female who presents for ongoing assessment and management of atrial fib and is seen by Roderic Palau, NP in the Afib clinic. She has been given the option of radiofrequency ablation, but has refused this opting for medical management. She has other history of ESRD on dialysis. She is not on anticoagulation due to CHADS VASC Score of 1.   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 During explant, she was placed on diltiazem 30 mg daily, and metoprolol was increased from 25 mg to 100 mg at HS due to hypertension. She has been recently diagnosed with right breast cancer, she was started on chemo and was thinking of double mastectomy.   She has been taken off of metoprolol due to hypotension and now only takes diltiazem on dialysis days.She states that her dialysis graft has failed and she now has an external port on the left. She continues chemo with Taxol Herceptin. She is on cycle 8 at the time of this office visit.  She has recently been treated for strep throat and is finishing up abx therapy.   Past Medical History:  Diagnosis Date  . Anemia   . Arthritis    knees  . Breast cancer ([place]) 2019   recent dx   . Cancer ([place])   . Chest pain Jan 2016   low risk Myoview   . Complication of anesthesia 2005   difficulty remembering for a while  . Constipation   . ESRD on dialysis [facility]) April 2016   MWF  . GERD (gastroesophageal reflux disease)   . Heart murmur Nov 2015   Aortic scleosis- no stenosis  . Hemodialysis patient ([place])   . Hypertension   . Shortness of breath dyspnea    with exertion    Past Surgical History:  Procedure Laterality Date  . ABDOMINAL HYSTERECTOMY  2005  . AV  FISTULA PLACEMENT Left 12/09/2013   Procedure: INSERTION OF ARTERIOVENOUS (AV) GORE-TEX GRAFT ARM;  Surgeon: Elam Dutch, MD;  Location: [place];  Service: Vascular;  Laterality: Left;  . BREAST BIOPSY    . BUNIONECTOMY Bilateral   . CHOLECYSTECTOMY  2005  . COLONOSCOPY    . IR GENERIC HISTORICAL  01/11/2016   IR US GUIDE VASC ACCESS RIGHT 01/11/2016 Corrie Mckusick, DO [facility]  . IR GENERIC HISTORICAL  01/11/2016   IR RADIOLOGY PERIPHERAL GUIDED IV START 01/11/2016 Corrie Mckusick, DO [facility]  . IR IMAGING GUIDED PORT INSERTION  07/05/2017  . 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 KNEE ARTHROSCOPY Bilateral   . PERIPHERAL VASCULAR CATHETERIZATION N/A 08/31/2015   Procedure: A/V Shuntogram;  Surgeon: Serafina Mitchell, MD;  Location: [place] [facility];  Service: Cardiovascular;  Laterality: N/A;  . PERIPHERAL VASCULAR CATHETERIZATION Left 08/31/2015   Procedure: Peripheral Vascular Balloon Angioplasty;  Surgeon: Serafina Mitchell, MD;  Location: [place] [facility];  Service: Cardiovascular;  Laterality: Left;  arm fistula  . ROTATOR CUFF REPAIR Right 1997     Current Outpatient Medications  Medication Sig Dispense Refill  . acetaminophen (TYLENOL) 500 MG tablet Take 1,000 mg by mouth daily as needed for mild pain, moderate pain or headache.     Marland Kitchen  amoxicillin (AMOXIL) 500 MG capsule Take 2,000 mg by mouth See admin instructions. Take 4 capsules (2000 mg) by mouth one hour prior to dental procedures    . aspirin EC 81 MG tablet Take 81 mg by mouth daily.    Marland Kitchen azithromycin (ZITHROMAX Z-PAK) 250 MG tablet Use as directed 6 each 0  . Darbepoetin Alfa-Albumin (ARANESP IJ) Inject as directed every 30 (thirty) days.    Marland Kitchen diltiazem (CARDIZEM) 30 MG tablet Take 30 mg by mouth 4 (four) times daily. Take 0.5 tablet po on the morning of dialysis    . lidocaine-prilocaine (EMLA) cream Apply 1 application topically See admin instructions. Apply topically Monday, Wednesday and Friday before  dialysis  6  . multivitamin (RENA-VIT) TABS tablet Take 1 tablet by mouth daily.    . ondansetron (ZOFRAN) 8 MG tablet Take 1 tablet (8 mg total) by mouth 2 (two) times daily as needed (Nausea or vomiting). 30 tablet 1  . pantoprazole (PROTONIX) 40 MG tablet TAKE 1 TABLET (40 MG TOTAL) BY MOUTH DAILY. 90 tablet 1  . prochlorperazine (COMPAZINE) 10 MG tablet Take 1 tablet (10 mg total) by mouth every 6 (six) hours as needed (Nausea or vomiting). 30 tablet 1  . sevelamer carbonate (RENVELA) 800 MG tablet Take 800-2,400 mg by mouth See admin instructions. 2,400mg  three times daily and 800mg  twice daily with snacks    . simvastatin (ZOCOR) 20 MG tablet Take 1 tablet (20 mg total) by mouth at bedtime. 30 tablet 11   No current facility-administered medications for this visit.     Allergies:   Patient has no known allergies.    Social History:  The patient  reports that she has never smoked. She has never used smokeless tobacco. She reports that she does not drink alcohol or use drugs.   Family History:  The patient's family history includes Diabetes in her sister; Heart attack in her father; Hyperlipidemia in her sister; Hypertension in her sister; Kidney disease in her father, mother, and sister.    ROS: All other systems are reviewed and negative. Unless otherwise mentioned in H&P    PHYSICAL EXAM: VS:  BP 100/76   Pulse 100   Ht 5\' 1"  (1.549 m)   Wt 191 lb 9.6 oz (86.9 kg)   SpO2 97%   BMI 36.20 kg/m  , BMI Body mass index is 36.2 kg/m. GEN: Well nourished, well developed, in no acute distress HEENT: normal. Alopecia is noted  Neck: no JVD, carotid bruits, or masses Cardiac: RRR; no murmurs, rubs, or gallops,no edema  Respiratory:  Clear to auscultation bilaterally, normal work of breathing GI: soft, nontender, nondistended, + BS MS: no deformity or atrophy. Port-a-cath is noted on the right upper chest, left external dialysis catheter located on the upper left chest.  Skin: warm  and dry, no rash Neuro:  Strength and sensation are intact Psych: euthymic mood, full affect   EKG:  Not completed on this office visit.   Recent Labs: 10/16/2017: ALT 10; BUN 23; Creatinine 6.37; Hemoglobin 9.5; Platelet Count 266; Potassium 3.7; Sodium 144    Lipid Panel    Component Value Date/Time   CHOL 165 06/08/2015 0704   TRIG 44 06/08/2015 0704   HDL 59 06/08/2015 0704   CHOLHDL 2.8 06/08/2015 0704   VLDL 9 06/08/2015 0704   LDLCALC 97 06/08/2015 0704      Wt Readings from Last 3 Encounters:  10/18/17 191 lb 9.6 oz (86.9 kg)  10/16/17 191 lb 12.8 oz (  87 kg)  10/09/17 194 lb 14.4 oz (88.4 kg)      Other studies Reviewed: Echocardiogram 07-17-17 Left ventricle: The cavity size was normal. Systolic function was   normal. The estimated ejection fraction was in the range of 60%   to 65%. Wall motion was normal; there were no regional wall   motion abnormalities. Doppler parameters are consistent with   abnormal left ventricular relaxation (grade 1 diastolic   dysfunction). Doppler parameters are consistent with   indeterminate ventricular filling pressure. - Aortic valve: There was no regurgitation. - Mitral valve: Transvalvular velocity was within the normal range.   There was no evidence for stenosis. There was no regurgitation. - Left atrium: The atrium was mildly dilated. - Right ventricle: The cavity size was normal. Wall thickness was   normal. Systolic function was normal. - Tricuspid valve: There was mild regurgitation. - Pulmonary arteries: Systolic pressure was within the normal   range. PA peak pressure: 25 mm Hg (S). - Global longitudinal strain -21.7% (normal).  ASSESSMENT AND PLAN:  1. Atrial fib: She is in normal rhythm at this time per auscultation. She denies rapid HR or palpitations. She is no longer on metoprolol due to hypotension. She only takes diltiazem on dialysis days. She is not on anticoagulation due to CHADS VASC Score of 1.   2.  Hypotension:  BP is currently low normal. Medications changed as above.   3. Breat Cancer: She is on chemo. I will repeat her echo in January 2020 with follow appointment with Dr. Gwenlyn Found thereafter.    Current medicines are reviewed at length with the patient today.    Labs/ tests ordered today include: Echocardiogram.   Phill Myron. West Pugh, ANP, AACC   10/18/2017 10:35 AM    [place] [address] Office [phone] Fax [phone]

## 2017-10-16 ENCOUNTER — Inpatient Hospital Stay (HOSPITAL_BASED_OUTPATIENT_CLINIC_OR_DEPARTMENT_OTHER): Payer: Medicare Other | Admitting: Hematology and Oncology

## 2017-10-16 ENCOUNTER — Inpatient Hospital Stay: Payer: Medicare Other

## 2017-10-16 ENCOUNTER — Inpatient Hospital Stay: Payer: Medicare Other | Attending: Hematology and Oncology

## 2017-10-16 DIAGNOSIS — Z171 Estrogen receptor negative status [ER-]: Principal | ICD-10-CM

## 2017-10-16 DIAGNOSIS — Z79899 Other long term (current) drug therapy: Secondary | ICD-10-CM

## 2017-10-16 DIAGNOSIS — C50411 Malignant neoplasm of upper-outer quadrant of right female breast: Secondary | ICD-10-CM | POA: Diagnosis not present

## 2017-10-16 DIAGNOSIS — R197 Diarrhea, unspecified: Secondary | ICD-10-CM | POA: Insufficient documentation

## 2017-10-16 DIAGNOSIS — G629 Polyneuropathy, unspecified: Secondary | ICD-10-CM

## 2017-10-16 DIAGNOSIS — Z95828 Presence of other vascular implants and grafts: Secondary | ICD-10-CM

## 2017-10-16 DIAGNOSIS — Z5112 Encounter for antineoplastic immunotherapy: Secondary | ICD-10-CM | POA: Diagnosis not present

## 2017-10-16 LAB — CBC WITH DIFFERENTIAL (CANCER CENTER ONLY)
BASOS ABS: 0.1 10*3/uL (ref 0.0–0.1)
Basophils Relative: 1 %
EOS ABS: 0.3 10*3/uL (ref 0.0–0.5)
Eosinophils Relative: 5 %
HEMATOCRIT: 28.1 % — AB (ref 34.8–46.6)
HEMOGLOBIN: 9.5 g/dL — AB (ref 11.6–15.9)
Lymphocytes Relative: 13 %
Lymphs Abs: 0.8 10*3/uL — ABNORMAL LOW (ref 0.9–3.3)
MCH: 32.5 pg (ref 25.1–34.0)
MCHC: 33.9 g/dL (ref 31.5–36.0)
MCV: 96.1 fL (ref 79.5–101.0)
Monocytes Absolute: 0.7 10*3/uL (ref 0.1–0.9)
Monocytes Relative: 11 %
NEUTROS ABS: 4.2 10*3/uL (ref 1.5–6.5)
NEUTROS PCT: 70 %
Platelet Count: 266 10*3/uL (ref 145–400)
RBC: 2.93 MIL/uL — ABNORMAL LOW (ref 3.70–5.45)
RDW: 18.8 % — AB (ref 11.2–14.5)
WBC Count: 6.1 10*3/uL (ref 3.9–10.3)

## 2017-10-16 LAB — CMP (CANCER CENTER ONLY)
ALT: 10 U/L (ref 0–44)
ANION GAP: 11 (ref 5–15)
AST: 17 U/L (ref 15–41)
Albumin: 3.2 g/dL — ABNORMAL LOW (ref 3.5–5.0)
Alkaline Phosphatase: 165 U/L — ABNORMAL HIGH (ref 38–126)
BILIRUBIN TOTAL: 0.4 mg/dL (ref 0.3–1.2)
BUN: 23 mg/dL (ref 8–23)
CO2: 32 mmol/L (ref 22–32)
CREATININE: 6.37 mg/dL — AB (ref 0.44–1.00)
Calcium: 8.6 mg/dL — ABNORMAL LOW (ref 8.9–10.3)
Chloride: 101 mmol/L (ref 98–111)
GFR, EST AFRICAN AMERICAN: 7 mL/min — AB (ref >60)
GFR, EST NON AFRICAN AMERICAN: 6 mL/min — AB (ref >60)
Glucose, Bld: 88 mg/dL (ref 70–99)
Potassium: 3.7 mmol/L (ref 3.5–5.1)
Sodium: 144 mmol/L (ref 135–145)
TOTAL PROTEIN: 6.7 g/dL (ref 6.5–8.1)

## 2017-10-16 MED ORDER — TRASTUZUMAB CHEMO 150 MG IV SOLR
6.0000 mg/kg | Freq: Once | INTRAVENOUS | Status: AC
  Administered 2017-10-16: 525 mg via INTRAVENOUS
  Filled 2017-10-16: qty 25

## 2017-10-16 MED ORDER — SODIUM CHLORIDE 0.9% FLUSH
10.0000 mL | INTRAVENOUS | Status: DC | PRN
  Administered 2017-10-16: 10 mL
  Filled 2017-10-16: qty 10

## 2017-10-16 MED ORDER — SODIUM CHLORIDE 0.9 % IV SOLN
Freq: Once | INTRAVENOUS | Status: AC
  Administered 2017-10-16: 11:00:00 via INTRAVENOUS
  Filled 2017-10-16: qty 250

## 2017-10-16 MED ORDER — DIPHENHYDRAMINE HCL 50 MG/ML IJ SOLN
INTRAMUSCULAR | Status: AC
  Filled 2017-10-16: qty 1

## 2017-10-16 MED ORDER — HEPARIN SOD (PORK) LOCK FLUSH 100 UNIT/ML IV SOLN
500.0000 [IU] | Freq: Once | INTRAVENOUS | Status: AC | PRN
  Administered 2017-10-16: 500 [IU]
  Filled 2017-10-16: qty 5

## 2017-10-16 MED ORDER — ACETAMINOPHEN 325 MG PO TABS
ORAL_TABLET | ORAL | Status: AC
  Filled 2017-10-16: qty 2

## 2017-10-16 MED ORDER — DIPHENHYDRAMINE HCL 50 MG/ML IJ SOLN
25.0000 mg | Freq: Once | INTRAMUSCULAR | Status: AC
  Administered 2017-10-16: 25 mg via INTRAVENOUS

## 2017-10-16 MED ORDER — ACETAMINOPHEN 325 MG PO TABS
650.0000 mg | ORAL_TABLET | Freq: Once | ORAL | Status: AC
  Administered 2017-10-16: 650 mg via ORAL

## 2017-10-16 NOTE — Progress Notes (Signed)
Patient Care Team: Lucianne Lei, MD as PCP - General (Family Medicine) Lorretta Harp, MD as PCP - Cardiology (Cardiology) Donato Heinz, MD (Nephrology) Lorretta Harp, MD as Consulting Physician (Cardiology) Pyrtle, Lajuan Lines, MD as Consulting Physician (Gastroenterology)  DIAGNOSIS:  Encounter Diagnosis  Name Primary?  . Malignant neoplasm of upper-outer quadrant of right breast in female, estrogen receptor negative ([place])     SUMMARY OF ONCOLOGIC HISTORY:   Malignant neoplasm of upper-outer quadrant of right breast in female, estrogen receptor negative ([place])   06/19/2017 Initial Diagnosis    Right breast skin thickening and heaviness: Screening mammogram detected 2 cm of microcalcifications in the right breast in the posterior third UOQ.?  Skin involvement, stereotactic biopsy revealed IDC grade 2-3 with high-grade DCIS, lymphovascular invasion present, ER 0%, PR 0%, HER-2 positive ratio 8.69, gene copy #11.3 T1CN0 stage Ia    07/02/2017 Cancer Staging    Staging form: Breast, AJCC 8th Edition - Clinical: Stage IA (cT1c, cN0, cM0, G3, ER-, PR-, HER2+) - Signed by Nicholas Lose, MD on 07/02/2017    07/17/2017 -  Chemotherapy    Taxol Herceptin weekly x12 followed by Herceptin maintenance x1 year    11/06/2017 -  Chemotherapy    The patient had trastuzumab (HERCEPTIN) 525 mg in sodium chloride 0.9 % 250 mL chemo infusion, 6 mg/kg = 525 mg (100 % of original dose 6 mg/kg), Intravenous,  Once, 0 of 13 cycles Dose modification: 6 mg/kg (original dose 6 mg/kg, Cycle 1, Reason: Dose not tolerated)  for chemotherapy treatment.      CHIEF COMPLIANT: Markedly improved symptoms related to upper respiratory infection.  Completed 10 cycles of Taxol Herceptin and going on to Herceptin maintenance  INTERVAL HISTORY: Karen [name] is a 62 year old with above-mentioned history of right breast cancer currently neoadjuvant chemotherapy with Taxol Herceptin.  She completed 10 cycles of chemo  and had progressively gotten fatigued and weak.  We had to hold last 2 weeks of treatment because of the symptoms.  She is here today to discuss the final treatment plan.  She has appointments with mammogram as well as her surgeon coming up next week.  She complains of fatigue.  REVIEW OF SYSTEMS:   Constitutional: Denies fevers, chills or abnormal weight loss Eyes: Denies blurriness of vision Ears, nose, mouth, throat, and face: Denies mucositis or sore throat Respiratory: Denies cough, dyspnea or wheezes Cardiovascular: Denies palpitation, chest discomfort Gastrointestinal:  Denies nausea, heartburn or change in bowel habits Skin: Denies abnormal skin rashes Lymphatics: Denies new lymphadenopathy or easy bruising Neurological:Denies numbness, tingling or new weaknesses Behavioral/Psych: Mood is stable, no new changes  Extremities: No lower extremity edema   All other systems were reviewed with the patient and are negative.  I have reviewed the past medical history, past surgical history, social history and family history with the patient and they are unchanged from previous note.  ALLERGIES:  has No Known Allergies.  MEDICATIONS:  Current Outpatient Medications  Medication Sig Dispense Refill  . acetaminophen (TYLENOL) 500 MG tablet Take 1,000 mg by mouth daily as needed for mild pain, moderate pain or headache.     Marland Kitchen amoxicillin (AMOXIL) 500 MG capsule Take 2,000 mg by mouth See admin instructions. Take 4 capsules (2000 mg) by mouth one hour prior to dental procedures    . aspirin EC 81 MG tablet Take 81 mg by mouth daily.    Marland Kitchen azithromycin (ZITHROMAX Z-PAK) 250 MG tablet Use as directed 6 each 0  .  Darbepoetin Alfa-Albumin (ARANESP IJ) Inject as directed every 30 (thirty) days.    Marland Kitchen diltiazem (CARDIZEM) 30 MG tablet Take 1/2 tablet the morning of dialysis (Patient taking differently: Take 15 mg by mouth See admin instructions. Take 1/2 tablet the morning of dialysis) 45 tablet 1  .  lidocaine-prilocaine (EMLA) cream Apply 1 application topically See admin instructions. Apply topically Monday, Wednesday and Friday before dialysis  6  . multivitamin (RENA-VIT) TABS tablet Take 1 tablet by mouth daily.    . ondansetron (ZOFRAN) 8 MG tablet Take 1 tablet (8 mg total) by mouth 2 (two) times daily as needed (Nausea or vomiting). 30 tablet 1  . pantoprazole (PROTONIX) 40 MG tablet TAKE 1 TABLET (40 MG TOTAL) BY MOUTH DAILY. (Patient taking differently: Take 40 mg by mouth daily. ) 90 tablet 1  . prochlorperazine (COMPAZINE) 10 MG tablet Take 1 tablet (10 mg total) by mouth every 6 (six) hours as needed (Nausea or vomiting). 30 tablet 1  . sevelamer carbonate (RENVELA) 800 MG tablet Take 800-2,400 mg by mouth See admin instructions. 2,422m three times daily and 8085mtwice daily with snacks    . simvastatin (ZOCOR) 20 MG tablet Take 1 tablet (20 mg total) by mouth at bedtime. 30 tablet 11   No current facility-administered medications for this visit.    Facility-Administered Medications Ordered in Other Visits  Medication Dose Route Frequency Provider Last Rate Last Dose  . sodium chloride flush (NS) 0.9 % injection 10 mL  10 mL Intracatheter PRN GuNicholas LoseMD   10 mL at 10/16/17 1304    PHYSICAL EXAMINATION: ECOG PERFORMANCE STATUS: 1 - Symptomatic but completely ambulatory  Vitals:   10/16/17 1009  BP: 132/77  Pulse: 75  Resp: 18  Temp: 98.2 F (36.8 C)  SpO2: 100%   Filed Weights   10/16/17 1009  Weight: 191 lb 12.8 oz (87 kg)    GENERAL:alert, no distress and comfortable SKIN: skin color, texture, turgor are normal, no rashes or significant lesions EYES: normal, Conjunctiva are pink and non-injected, sclera clear OROPHARYNX:no exudate, no erythema and lips, buccal mucosa, and tongue normal  NECK: supple, thyroid normal size, non-tender, without nodularity LYMPH:  no palpable lymphadenopathy in the cervical, axillary or inguinal LUNGS: clear to auscultation  and percussion with normal breathing effort HEART: regular rate & rhythm and no murmurs and no lower extremity edema ABDOMEN:abdomen soft, non-tender and normal bowel sounds MUSCULOSKELETAL:no cyanosis of digits and no clubbing  NEURO: alert & oriented x 3 with fluent speech, no focal motor/sensory deficits EXTREMITIES: No lower extremity edema   LABORATORY DATA:  I have reviewed the data as listed CMP Latest Ref Rng & Units 10/16/2017 10/09/2017 10/05/2017  Glucose 70 - 99 mg/dL 88 130(H) 102(H)  BUN 8 - 23 mg/dL 23 22 28(H)  Creatinine 0.44 - 1.00 mg/dL 6.37(HH) 7.25(HH) 7.80(H)  Sodium 135 - 145 mmol/L 144 141 142  Potassium 3.5 - 5.1 mmol/L 3.7 4.3 4.5  Chloride 98 - 111 mmol/L 101 102 102  CO2 22 - 32 mmol/L 32 29 30  Calcium 8.9 - 10.3 mg/dL 8.6(L) 8.3(L) 8.5(L)  Total Protein 6.5 - 8.1 g/dL 6.7 6.6 -  Total Bilirubin 0.3 - 1.2 mg/dL 0.4 0.3 -  Alkaline Phos 38 - 126 U/L 165(H) 194(H) -  AST 15 - 41 U/L 17 14(L) -  ALT 0 - 44 U/L 10 10 -    Lab Results  Component Value Date   WBC 6.1 10/16/2017   HGB 9.5 (  L) 10/16/2017   HCT 28.1 (L) 10/16/2017   MCV 96.1 10/16/2017   PLT 266 10/16/2017   NEUTROABS 4.2 10/16/2017    ASSESSMENT & PLAN:  Malignant neoplasm of upper-outer quadrant of right breast in female, estrogen receptor negative ([place]) 06/19/2017: Right breast skin thickening and heaviness: Screening mammogram detected 2 cm of microcalcifications in the right breast in the posterior third UOQ.? 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 neoadjuvant chemotherapy with Taxol Herceptin weekly x12 followed by Herceptin maintenance for 1 year 2. Followed by surgery with either lumpectomy or mastectomy depending on the MRI findings and treatment response 3. Followed by adjuvant radiation if she undergoes breast conserving  surgery 4. Followed by adjuvant antiestrogen therapy  -------------------------------------------------------------------------------- Current treatment: Completed 10 cycles of Taxol Herceptin(with Granix support): Treatment was held last week for upper respiratory infection  Chemo Toxicities: 1.Neutropenia: With Granix being given on the Saturday prior to treatment, counts are remaining stable 2.moderate to severe fatigue 3.Chemotherapy-induced anemia on top of anemia due to chronic kidney disease being monitored closely, today's hemoglobin is  She received blood transfusion previously. 4.Diarrhea for which she takes Imodium 5.Peripheral neuropathy grade 1  Upper respiratory tract infection: Improved with azithromycin.  I discussed with her about discontinuing Taxol and keeping her on Herceptin maintenance every 3 weeks to complete 1 year I recommended mammogram and ultrasound and a follow-up with surgery. Return to clinic every 3 weeks for Herceptin every 6 weeks for follow-up with me.    No orders of the defined types were placed in this encounter.  The patient has a good understanding of the overall plan. she agrees with it. she will call with any problems that may develop before the next visit here.   Harriette Ohara, MD 10/16/17

## 2017-10-16 NOTE — Progress Notes (Signed)
Per Dr. Lindi Adie, ok to proceed with treatment with Scr 6.37

## 2017-10-16 NOTE — Patient Instructions (Signed)
[facility] Discharge Instructions for Patients Receiving Chemotherapy  Today you received the following chemotherapy agents Herceptin  To help prevent nausea and vomiting after your treatment, we encourage you to take your nausea medication as directed   If you develop nausea and vomiting that is not controlled by your nausea medication, call the clinic.   BELOW ARE SYMPTOMS THAT SHOULD BE REPORTED IMMEDIATELY:  *FEVER GREATER THAN 100.5 F  *CHILLS WITH OR WITHOUT FEVER  NAUSEA AND VOMITING THAT IS NOT CONTROLLED WITH YOUR NAUSEA MEDICATION  *UNUSUAL SHORTNESS OF BREATH  *UNUSUAL BRUISING OR BLEEDING  TENDERNESS IN MOUTH AND THROAT WITH OR WITHOUT PRESENCE OF ULCERS  *URINARY PROBLEMS  *BOWEL PROBLEMS  UNUSUAL RASH Items with * indicate a potential emergency and should be followed up as soon as possible.  Feel free to call the clinic should you have any questions or concerns. The clinic phone number is [phone].  Please show the CHEMO ALERT CARD at check-in to the Emergency Department and triage nurse.   

## 2017-10-16 NOTE — Assessment & Plan Note (Addendum)
06/19/2017: Right breast skin thickening and heaviness: Screening mammogram detected 2 cm of microcalcifications in the right breast in the posterior third UOQ.? 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 neoadjuvant chemotherapy with Taxol Herceptin weekly x12 followed by Herceptin maintenance for 1 year 2. Followed by surgery with either lumpectomy or mastectomy depending on the MRI findings and treatment response 3. Followed by adjuvant radiation if she undergoes breast conserving surgery 4. Followed by adjuvant antiestrogen therapy  -------------------------------------------------------------------------------- Current treatment: Completed 10 cycles of Taxol Herceptin(with Granix support): Treatment was held last week for upper respiratory infection  Chemo Toxicities: 1.Neutropenia: With Granix being given on the Saturday prior to treatment, counts are remaining stable 2.moderate to severe fatigue 3.Chemotherapy-induced anemia on top of anemia due to chronic kidney disease being monitored closely, today's hemoglobin is  She received blood transfusion previously. 4.Diarrhea for which she takes Imodium 5.Peripheral neuropathy grade 1  Upper respiratory tract infection: Improved with azithromycin.  I discussed with her about discontinuing Taxol and keeping her on Herceptin maintenance every 3 weeks to complete 1 year I recommended mammogram and ultrasound and a follow-up with surgery. Return to clinic every 3 weeks for Herceptin every 6 weeks for follow-up with me.

## 2017-10-17 ENCOUNTER — Telehealth: Payer: Self-pay | Admitting: Hematology and Oncology

## 2017-10-17 NOTE — Telephone Encounter (Signed)
Spoke to pt regarding upcoming appts. Pt will pick up copy of schedule 10/22

## 2017-10-18 ENCOUNTER — Ambulatory Visit
Admission: RE | Admit: 2017-10-18 | Discharge: 2017-10-18 | Disposition: A | Discharge status: Home / Self Care | Payer: Medicare Other | Source: Ambulatory Visit | Attending: Hematology and Oncology | Admitting: Hematology and Oncology

## 2017-10-18 ENCOUNTER — Ambulatory Visit (INDEPENDENT_AMBULATORY_CARE_PROVIDER_SITE_OTHER): Payer: Medicare Other | Admitting: Adult Health

## 2017-10-18 ENCOUNTER — Encounter: Payer: Self-pay | Admitting: Adult Health

## 2017-10-18 VITALS — BP 100/76 | HR 100 | Ht 61.0 in | Wt 191.6 lb

## 2017-10-18 DIAGNOSIS — I519 Heart disease, unspecified: Secondary | ICD-10-CM | POA: Diagnosis not present

## 2017-10-18 DIAGNOSIS — I480 Paroxysmal atrial fibrillation: Secondary | ICD-10-CM

## 2017-10-18 DIAGNOSIS — Z171 Estrogen receptor negative status [ER-]: Principal | ICD-10-CM

## 2017-10-18 DIAGNOSIS — I952 Hypotension due to drugs: Secondary | ICD-10-CM | POA: Diagnosis not present

## 2017-10-18 DIAGNOSIS — C50411 Malignant neoplasm of upper-outer quadrant of right female breast: Secondary | ICD-10-CM

## 2017-10-18 NOTE — Patient Instructions (Signed)
Medication Instructions:  NO CHANGES- Your physician recommends that you continue on your current medications as directed. Please refer to the Current Medication list given to you today.  If you need a refill on your cardiac medications before your next appointment, please call your pharmacy.  Testing/Procedures: Echocardiogram - Your physician has requested that you have an echocardiogram. Echocardiography is a painless test that uses sound waves to create images of your heart. It provides your doctor with information about the size and shape of your heart and how well your heart's chambers and valves are working. This procedure takes approximately one hour. There are no restrictions for this procedure. This will be performed at our [facility] location - [address].  Follow-Up: Your physician wants you to follow-up in: AFTER ECHO WITH DR Gwenlyn Found.    Thank you for choosing [facility]!!

## 2017-10-22 ENCOUNTER — Other Ambulatory Visit: Payer: Self-pay | Admitting: Cardiology

## 2017-10-23 ENCOUNTER — Other Ambulatory Visit: Payer: Self-pay | Admitting: Surgery

## 2017-10-23 DIAGNOSIS — Z853 Personal history of malignant neoplasm of breast: Secondary | ICD-10-CM

## 2017-11-01 ENCOUNTER — Ambulatory Visit (INDEPENDENT_AMBULATORY_CARE_PROVIDER_SITE_OTHER): Payer: Medicare Other | Admitting: Vascular Surgery

## 2017-11-01 ENCOUNTER — Ambulatory Visit (INDEPENDENT_AMBULATORY_CARE_PROVIDER_SITE_OTHER)
Admission: RE | Admit: 2017-11-01 | Discharge: 2017-11-01 | Disposition: A | Discharge status: Home / Self Care | Payer: Medicare Other | Source: Ambulatory Visit | Attending: Vascular Surgery | Admitting: Vascular Surgery

## 2017-11-01 ENCOUNTER — Ambulatory Visit (HOSPITAL_COMMUNITY)
Admission: RE | Admit: 2017-11-01 | Discharge: 2017-11-01 | Disposition: A | Discharge status: Home / Self Care | Payer: Medicare Other | Source: Ambulatory Visit | Attending: Vascular Surgery | Admitting: Vascular Surgery

## 2017-11-01 ENCOUNTER — Encounter: Payer: Self-pay | Admitting: Vascular Surgery

## 2017-11-01 VITALS — BP 126/72 | HR 80 | Temp 97.5°F | Resp 16 | Ht 61.0 in | Wt 191.0 lb

## 2017-11-01 DIAGNOSIS — Z992 Dependence on renal dialysis: Secondary | ICD-10-CM | POA: Diagnosis present

## 2017-11-01 DIAGNOSIS — N186 End stage renal disease: Secondary | ICD-10-CM | POA: Diagnosis not present

## 2017-11-01 NOTE — Progress Notes (Signed)
Referring Physician: Dr Arty Baumgartner  Patient name: Karen [name] MRN: [id number] DOB: 12/26/1955 Sex: female  REASON FOR CONSULT: Hemodialysis access  HPI: Karen [name] is a 62 y.o. female, with a failed left upper arm AV graft.  This was placed in 2015.  She is currently dialyzing via left sided catheter.  She has a right side Port-A-Cath.  She is currently undergoing therapy for breast cancer.  She is scheduled to undergo a right mastectomy and axillary node sampling in 1 month.  Currently dialyzes Monday Wednesday Friday.    Past Medical History:  Diagnosis Date  . Anemia   . Arthritis    knees  . Breast cancer ([place]) 2019   Right Breast Cancer  . Cancer ([place])   . Chest pain Jan 2016   low risk Myoview   . Complication of anesthesia 2005   difficulty remembering for a while  . Constipation   . ESRD on dialysis [facility]) April 2016   MWF  . GERD (gastroesophageal reflux disease)   . Heart murmur Nov 2015   Aortic scleosis- no stenosis  . Hemodialysis patient ([place])   . Hypertension   . Shortness of breath dyspnea    with exertion   Past Surgical History:  Procedure Laterality Date  . ABDOMINAL HYSTERECTOMY  2005  . AV FISTULA PLACEMENT Left 12/09/2013   Procedure: INSERTION OF ARTERIOVENOUS (AV) GORE-TEX GRAFT ARM;  Surgeon: Elam Dutch, MD;  Location: [facility];  Service: Vascular;  Laterality: Left;  . BREAST BIOPSY    . BUNIONECTOMY Bilateral   . CHOLECYSTECTOMY  2005  . COLONOSCOPY    . IR GENERIC HISTORICAL  01/11/2016   IR US GUIDE VASC ACCESS RIGHT 01/11/2016 Corrie Mckusick, DO [facility]  . IR GENERIC HISTORICAL  01/11/2016   IR RADIOLOGY PERIPHERAL GUIDED IV START 01/11/2016 Corrie Mckusick, DO [facility]  . IR IMAGING GUIDED PORT INSERTION  07/05/2017  . 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 KNEE ARTHROSCOPY Bilateral   . PERIPHERAL VASCULAR CATHETERIZATION N/A 08/31/2015   Procedure: A/V Shuntogram;  Surgeon: Serafina Mitchell, MD;  Location: [facility];  Service: Cardiovascular;  Laterality: N/A;  . PERIPHERAL VASCULAR CATHETERIZATION Left 08/31/2015   Procedure: Peripheral Vascular Balloon Angioplasty;  Surgeon: Serafina Mitchell, MD;  Location: [facility];  Service: Cardiovascular;  Laterality: Left;  arm fistula  . ROTATOR CUFF REPAIR Right 1997    Family History  Problem Relation Age of Onset  . Kidney disease Mother   . Heart attack Father   . Kidney disease Father   . Diabetes Sister   . Hyperlipidemia Sister   . Hypertension Sister   . Kidney disease Sister        x2    SOCIAL HISTORY: Social History   Socioeconomic History  . Marital status: Married    Spouse name: Not on file  . Number of children: 2  . Years of education: Not on file  . Highest education level: Not on file  Occupational History  . Occupation: [employer] OFFICE    Employer: [employer]  Social Needs  . Financial resource strain: Not on file  . Food insecurity:    Worry: Not on file    Inability: Not on file  . Transportation needs:    Medical: Not on file    Non-medical: Not on file  Tobacco Use  . Smoking status: Never Smoker  . Smokeless tobacco: Never Used  Substance and  Sexual Activity  . Alcohol use: No  . Drug use: No  . Sexual activity: Not on file    Comment: Hysterectomy  Lifestyle  . Physical activity:    Days per week: Not on file    Minutes per session: Not on file  . Stress: Not on file  Relationships  . Social connections:    Talks on phone: Not on file    Gets together: Not on file    Attends religious service: Not on file    Active member of club or organization: Not on file    Attends meetings of clubs or organizations: Not on file    Relationship status: Not on file  . Intimate partner violence:    Fear of current or ex partner: Not on file    Emotionally abused: Not on file    Physically abused: Not on file    Forced sexual activity: Not on file  Other Topics Concern  . Not on file    Social History Narrative  . Not on file    No Known Allergies  Current Outpatient Medications  Medication Sig Dispense Refill  . acetaminophen (TYLENOL) 500 MG tablet Take 1,000 mg by mouth daily as needed for mild pain, moderate pain or headache.     Marland Kitchen amoxicillin (AMOXIL) 500 MG capsule Take 2,000 mg by mouth See admin instructions. Take 4 capsules (2000 mg) by mouth one hour prior to dental procedures    . aspirin EC 81 MG tablet Take 81 mg by mouth daily.    . Darbepoetin Alfa-Albumin (ARANESP IJ) Inject as directed every 30 (thirty) days.    Marland Kitchen diltiazem (CARDIZEM) 30 MG tablet Take 30 mg by mouth as directed. Take 0.5 tablet po on the morning of dialysis     . lidocaine-prilocaine (EMLA) cream Apply 1 application topically See admin instructions. Apply topically Monday, Wednesday and Friday before dialysis  6  . multivitamin (RENA-VIT) TABS tablet Take 1 tablet by mouth daily.    . ondansetron (ZOFRAN) 8 MG tablet Take 1 tablet (8 mg total) by mouth 2 (two) times daily as needed (Nausea or vomiting). 30 tablet 1  . pantoprazole (PROTONIX) 40 MG tablet TAKE 1 TABLET (40 MG TOTAL) BY MOUTH DAILY. 90 tablet 1  . prochlorperazine (COMPAZINE) 10 MG tablet Take 1 tablet (10 mg total) by mouth every 6 (six) hours as needed (Nausea or vomiting). 30 tablet 1  . sevelamer carbonate (RENVELA) 800 MG tablet Take 800-2,400 mg by mouth See admin instructions. 2,400mg  three times daily and 800mg  twice daily with snacks    . simvastatin (ZOCOR) 20 MG tablet TAKE 1 TABLET BY MOUTH EVERYDAY AT BEDTIME 90 tablet 1   No current facility-administered medications for this visit.     ROS:   General:  No weight loss, Fever, chills  HEENT: No recent headaches, no nasal bleeding, no visual changes, no sore throat  Neurologic: No dizziness, blackouts, seizures. No recent symptoms of stroke or mini- stroke. No recent episodes of slurred speech, or temporary blindness.  Cardiac: No recent episodes of chest  pain/pressure, no shortness of breath at rest.  No shortness of breath with exertion.  Denies history of atrial fibrillation or irregular heartbeat  Vascular: No history of rest pain in feet.  No history of claudication.  No history of non-healing ulcer, No history of DVT   Pulmonary: No home oxygen, no productive cough, no hemoptysis,  No asthma or wheezing  Musculoskeletal:  [ ]  Arthritis, [ ]   Low back pain,  [ ]  Joint pain  Hematologic:No history of hypercoagulable state.  No history of easy bleeding.  No history of anemia  Gastrointestinal: No hematochezia or melena,  No gastroesophageal reflux, no trouble swallowing  Urinary: [X]  chronic Kidney disease, [X]  on HD - [X]  MWF or [ ]  TTHS, [ ]  Burning with urination, [ ]  Frequent urination, [ ]  Difficulty urinating;   Skin: No rashes  Psychological: No history of anxiety,  No history of depression   Physical Examination  Vitals:   11/01/17 1018  BP: 126/72  Pulse: 80  Resp: 16  Temp: (!) 97.5 F (36.4 C)  TempSrc: Oral  SpO2: 100%  Weight: 191 lb (86.6 kg)  Height: 5\' 1"  (1.549 m)    Body mass index is 36.09 kg/m.  General:  Alert and oriented, no acute distress HEENT: Normal Neck: No bruit or JVD Pulmonary: Clear to auscultation bilaterally, left chest dialysis catheter, right side Port-A-Cath Cardiac: Regular Rate and Rhythm without murmur Extremity Pulses:  2+ radial, brachial pulses bilaterally Musculoskeletal: No deformity or edema  Neurologic: Upper and lower extremity motor 5/5 and symmetric  DATA:  Patient had a vein mapping ultrasound today which I reviewed and interpreted.  This showed a very small cephalic vein less than 2 mm in diameter.  Basilic vein was also fairly small 2 mm in diameter.  She also had an upper extremity arterial duplex exam which I reviewed and interpreted.  This showed a 3 mm brachial artery in the normal anatomic configuration  ASSESSMENT: Patient currently has a left side dialysis  catheter.  She does need a long-term permanent hemodialysis access.  However her next access option is most likely going to be a right upper arm AV graft.  This is complicated by the fact that she is scheduled in the near future for a right mastectomy axillary node sampling and has a right side Port-A-Cath.   PLAN: We will defer placing her next dialysis access until after her mastectomy and lymph node sampling.  I will see her back after this for consideration of placement of a right upper arm AV graft if she has patent central veins on the right side after her mastectomy and axillary node sampling.   Karen Hinds, MD [facility] Office: [phone] Pager: [phone]

## 2017-11-06 ENCOUNTER — Inpatient Hospital Stay: Payer: Medicare Other

## 2017-11-06 VITALS — BP 143/87 | HR 70 | Temp 98.0°F | Resp 17

## 2017-11-06 DIAGNOSIS — Z5112 Encounter for antineoplastic immunotherapy: Secondary | ICD-10-CM | POA: Diagnosis not present

## 2017-11-06 DIAGNOSIS — Z171 Estrogen receptor negative status [ER-]: Principal | ICD-10-CM

## 2017-11-06 DIAGNOSIS — C50411 Malignant neoplasm of upper-outer quadrant of right female breast: Secondary | ICD-10-CM

## 2017-11-06 MED ORDER — ACETAMINOPHEN 325 MG PO TABS
650.0000 mg | ORAL_TABLET | Freq: Once | ORAL | Status: AC
  Administered 2017-11-06: 650 mg via ORAL

## 2017-11-06 MED ORDER — HEPARIN SOD (PORK) LOCK FLUSH 100 UNIT/ML IV SOLN
500.0000 [IU] | Freq: Once | INTRAVENOUS | Status: DC | PRN
  Filled 2017-11-06: qty 5

## 2017-11-06 MED ORDER — SODIUM CHLORIDE 0.9 % IV SOLN
Freq: Once | INTRAVENOUS | Status: AC
  Administered 2017-11-06: 09:00:00 via INTRAVENOUS
  Filled 2017-11-06: qty 250

## 2017-11-06 MED ORDER — DIPHENHYDRAMINE HCL 25 MG PO CAPS
ORAL_CAPSULE | ORAL | Status: AC
  Filled 2017-11-06: qty 2

## 2017-11-06 MED ORDER — TRASTUZUMAB CHEMO 150 MG IV SOLR
6.0000 mg/kg | Freq: Once | INTRAVENOUS | Status: AC
  Administered 2017-11-06: 525 mg via INTRAVENOUS
  Filled 2017-11-06: qty 25

## 2017-11-06 MED ORDER — SODIUM CHLORIDE 0.9% FLUSH
10.0000 mL | INTRAVENOUS | Status: DC | PRN
  Filled 2017-11-06: qty 10

## 2017-11-06 MED ORDER — DIPHENHYDRAMINE HCL 25 MG PO CAPS
50.0000 mg | ORAL_CAPSULE | Freq: Once | ORAL | Status: AC
  Administered 2017-11-06: 50 mg via ORAL

## 2017-11-06 MED ORDER — ACETAMINOPHEN 325 MG PO TABS
ORAL_TABLET | ORAL | Status: AC
  Filled 2017-11-06: qty 2

## 2017-11-06 NOTE — Patient Instructions (Signed)
[facility] Discharge Instructions for Patients Receiving Chemotherapy  Today you received the following chemotherapy agents: Herceptin  To help prevent nausea and vomiting after your treatment, we encourage you to take your nausea medication as directed.   If you develop nausea and vomiting that is not controlled by your nausea medication, call the clinic.   BELOW ARE SYMPTOMS THAT SHOULD BE REPORTED IMMEDIATELY:  *FEVER GREATER THAN 100.5 F  *CHILLS WITH OR WITHOUT FEVER  NAUSEA AND VOMITING THAT IS NOT CONTROLLED WITH YOUR NAUSEA MEDICATION  *UNUSUAL SHORTNESS OF BREATH  *UNUSUAL BRUISING OR BLEEDING  TENDERNESS IN MOUTH AND THROAT WITH OR WITHOUT PRESENCE OF ULCERS  *URINARY PROBLEMS  *BOWEL PROBLEMS  UNUSUAL RASH Items with * indicate a potential emergency and should be followed up as soon as possible.  Feel free to call the clinic should you have any questions or concerns. The clinic phone number is [phone].  Please show the [facility] at check-in to the Emergency Department and triage nurse.  Trastuzumab injection for infusion What is this medicine? TRASTUZUMAB (tras TOO zoo mab) is a monoclonal antibody. It is used to treat breast cancer and stomach cancer. This medicine may be used for other purposes; ask your health care provider or pharmacist if you have questions. COMMON BRAND NAME(S): Herceptin What should I tell my health care provider before I take this medicine? They need to know if you have any of these conditions: -heart disease -heart failure -lung or breathing disease, like asthma -an unusual or allergic reaction to trastuzumab, benzyl alcohol, or other medications, foods, dyes, or preservatives -pregnant or trying to get pregnant -breast-feeding How should I use this medicine? This drug is given as an infusion into a vein. It is administered in a hospital or clinic by a specially trained health care professional. Talk to  your pediatrician regarding the use of this medicine in children. This medicine is not approved for use in children. Overdosage: If you think you have taken too much of this medicine contact a poison control center or emergency room at once. NOTE: This medicine is only for you. Do not share this medicine with others. What if I miss a dose? It is important not to miss a dose. Call your doctor or health care professional if you are unable to keep an appointment. What may interact with this medicine? This medicine may interact with the following medications: -certain types of chemotherapy, such as daunorubicin, doxorubicin, epirubicin, and idarubicin This list may not describe all possible interactions. Give your health care provider a list of all the medicines, herbs, non-prescription drugs, or dietary supplements you use. Also tell them if you smoke, drink alcohol, or use illegal drugs. Some items may interact with your medicine. What should I watch for while using this medicine? Visit your doctor for checks on your progress. Report any side effects. Continue your course of treatment even though you feel ill unless your doctor tells you to stop. Call your doctor or health care professional for advice if you get a fever, chills or sore throat, or other symptoms of a cold or flu. Do not treat yourself. Try to avoid being around people who are sick. You may experience fever, chills and shaking during your first infusion. These effects are usually mild and can be treated with other medicines. Report any side effects during the infusion to your health care professional. Fever and chills usually do not happen with later infusions. Do not become pregnant while  taking this medicine or for 7 months after stopping it. Women should inform their doctor if they wish to become pregnant or think they might be pregnant. Women of child-bearing potential will need to have a negative pregnancy test before starting this  medicine. There is a potential for serious side effects to an unborn child. Talk to your health care professional or pharmacist for more information. Do not breast-feed an infant while taking this medicine or for 7 months after stopping it. Women must use effective birth control with this medicine. What side effects may I notice from receiving this medicine? Side effects that you should report to your doctor or health care professional as soon as possible: -allergic reactions like skin rash, itching or hives, swelling of the face, lips, or tongue -chest pain or palpitations -cough -dizziness -feeling faint or lightheaded, falls -fever -general ill feeling or flu-like symptoms -signs of worsening heart failure like breathing problems; swelling in your legs and feet -unusually weak or tired Side effects that usually do not require medical attention (report to your doctor or health care professional if they continue or are bothersome): -bone pain -changes in taste -diarrhea -joint pain -nausea/vomiting -weight loss This list may not describe all possible side effects. Call your doctor for medical advice about side effects. You may report side effects to FDA at 1-800-FDA-1088. Where should I keep my medicine? This drug is given in a hospital or clinic and will not be stored at home. NOTE: This sheet is a summary. It may not cover all possible information. If you have questions about this medicine, talk to your doctor, pharmacist, or health care provider.  2018 Elsevier/Gold Standard (2015-12-28 14:37:52)

## 2017-11-12 NOTE — Pre-Procedure Instructions (Signed)
Karen [name]  11/12/2017      [id number] - [place] [address], [place] [address] [place] [address] Phone: [phone] Fax: [phone]    Your procedure is scheduled on Nov. 5  Report to [facility] Admitting at 5:30  A.M.  Call this number if you have problems the morning of surgery:  [phone]   Remember:  Do not eat or drink after midnight.  You may drink clear liquids until 4:30a.M.  Clear liquids allowed are:                    Water, Juice (non-citric and without pulp), Carbonated beverages, Clear Tea, Black Coffee only, Plain Jell-O only, Gatorade and Plain Popsicles only    Take these medicines the morning of surgery with A SIP OF WATER :              Tylenol if needed             zofran or compazine if needed             Pantoprazole (protonix)                 7 days prior to surgery STOP taking any Aspirin(unless otherwise instructed by your surgeon), Aleve, Naproxen, Ibuprofen, Motrin, Advil, Goody's, BC's, all herbal medications, fish oil, and all vitamins              Follow your surgeon's instructions on when to stop Asprin.  If no instructions were given by your surgeon then you will need to call the office to get those instructions.        Do not wear jewelry, make-up or nail polish.  Do not wear lotions, powders, or perfumes, or deodorant.  Do not shave 48 hours prior to surgery.  Men may shave face and neck.  Do not bring valuables to the hospital.  [facility] is not responsible for any belongings or valuables.  Contacts, dentures or bridgework may not be worn into surgery.  Leave your suitcase in the car.  After surgery it may be brought to your room.  For patients admitted to the hospital, discharge time will be determined by your treatment team.  Patients discharged the day of surgery will not be allowed to drive home.    Special instructions:   - Preparing For Surgery  Before surgery,  you can play an important role. Because skin is not sterile, your skin needs to be as free of germs as possible. You can reduce the number of germs on your skin by washing with CHG (chlorahexidine gluconate) Soap before surgery.  CHG is an antiseptic cleaner which kills germs and bonds with the skin to continue killing germs even after washing.    Oral Hygiene is also important to reduce your risk of infection.  Remember - BRUSH YOUR TEETH THE MORNING OF SURGERY WITH YOUR REGULAR TOOTHPASTE  Please do not use if you have an allergy to CHG or antibacterial soaps. If your skin becomes reddened/irritated stop using the CHG.  Do not shave (including legs and underarms) for at least 48 hours prior to first CHG shower. It is OK to shave your face.  Please follow these instructions carefully.   1. Shower the NIGHT BEFORE SURGERY and the MORNING OF SURGERY with CHG.   2. If you chose to wash your hair, wash your hair first as usual with your normal shampoo.  3. After you shampoo,  rinse your hair and body thoroughly to remove the shampoo.  4. Use CHG as you would any other liquid soap. You can apply CHG directly to the skin and wash gently with a scrungie or a clean washcloth.   5. Apply the CHG Soap to your body ONLY FROM THE NECK DOWN.  Do not use on open wounds or open sores. Avoid contact with your eyes, ears, mouth and genitals (private parts). Wash Face and genitals (private parts)  with your normal soap.  6. Wash thoroughly, paying special attention to the area where your surgery will be performed.  7. Thoroughly rinse your body with warm water from the neck down.  8. DO NOT shower/wash with your normal soap after using and rinsing off the CHG Soap.  9. Pat yourself dry with a CLEAN TOWEL.  10. Wear CLEAN PAJAMAS to bed the night before surgery, wear comfortable clothes the morning of surgery  11. Place CLEAN SHEETS on your bed the night of your first shower and DO NOT SLEEP WITH  PETS.    Day of Surgery:  Do not apply any deodorants/lotions.  Please wear clean clothes to the hospital/surgery center.   Remember to brush your teeth WITH YOUR REGULAR TOOTHPASTE.    Please read over the following fact sheets that you were given. Coughing and Deep Breathing and Surgical Site Infection Prevention

## 2017-11-13 ENCOUNTER — Encounter (HOSPITAL_COMMUNITY)
Admission: RE | Admit: 2017-11-13 | Discharge: 2017-11-13 | Disposition: A | Discharge status: Home / Self Care | Payer: Medicare Other | Source: Ambulatory Visit | Attending: Surgery | Admitting: Surgery

## 2017-11-13 ENCOUNTER — Encounter (HOSPITAL_COMMUNITY): Payer: Self-pay

## 2017-11-13 DIAGNOSIS — Z992 Dependence on renal dialysis: Secondary | ICD-10-CM | POA: Insufficient documentation

## 2017-11-13 DIAGNOSIS — N186 End stage renal disease: Secondary | ICD-10-CM | POA: Insufficient documentation

## 2017-11-13 DIAGNOSIS — C50911 Malignant neoplasm of unspecified site of right female breast: Secondary | ICD-10-CM | POA: Insufficient documentation

## 2017-11-13 DIAGNOSIS — Z7982 Long term (current) use of aspirin: Secondary | ICD-10-CM | POA: Diagnosis not present

## 2017-11-13 DIAGNOSIS — I132 Hypertensive heart and chronic kidney disease with heart failure and with stage 5 chronic kidney disease, or end stage renal disease: Secondary | ICD-10-CM | POA: Insufficient documentation

## 2017-11-13 DIAGNOSIS — I4892 Unspecified atrial flutter: Secondary | ICD-10-CM | POA: Diagnosis not present

## 2017-11-13 DIAGNOSIS — Z79899 Other long term (current) drug therapy: Secondary | ICD-10-CM | POA: Insufficient documentation

## 2017-11-13 DIAGNOSIS — Z01812 Encounter for preprocedural laboratory examination: Secondary | ICD-10-CM | POA: Diagnosis present

## 2017-11-13 DIAGNOSIS — K219 Gastro-esophageal reflux disease without esophagitis: Secondary | ICD-10-CM | POA: Diagnosis not present

## 2017-11-13 DIAGNOSIS — I509 Heart failure, unspecified: Secondary | ICD-10-CM | POA: Diagnosis not present

## 2017-11-13 DIAGNOSIS — I4891 Unspecified atrial fibrillation: Secondary | ICD-10-CM | POA: Diagnosis not present

## 2017-11-13 DIAGNOSIS — Z940 Kidney transplant status: Secondary | ICD-10-CM | POA: Diagnosis not present

## 2017-11-13 DIAGNOSIS — D649 Anemia, unspecified: Secondary | ICD-10-CM | POA: Diagnosis not present

## 2017-11-13 DIAGNOSIS — M1710 Unilateral primary osteoarthritis, unspecified knee: Secondary | ICD-10-CM | POA: Diagnosis not present

## 2017-11-13 DIAGNOSIS — I700 Atherosclerosis of aorta: Secondary | ICD-10-CM | POA: Diagnosis not present

## 2017-11-13 HISTORY — DX: Cardiac arrhythmia, unspecified: I49.9

## 2017-11-13 HISTORY — DX: Heart failure, unspecified: I50.9

## 2017-11-13 LAB — BASIC METABOLIC PANEL
ANION GAP: 12 (ref 5–15)
BUN: 25 mg/dL — ABNORMAL HIGH (ref 8–23)
CO2: 28 mmol/L (ref 22–32)
Calcium: 8.6 mg/dL — ABNORMAL LOW (ref 8.9–10.3)
Chloride: 102 mmol/L (ref 98–111)
Creatinine, Ser: 5.99 mg/dL — ABNORMAL HIGH (ref 0.44–1.00)
GFR calc Af Amer: 8 mL/min — ABNORMAL LOW (ref >60)
GFR, EST NON AFRICAN AMERICAN: 7 mL/min — AB (ref >60)
Glucose, Bld: 81 mg/dL (ref 70–99)
POTASSIUM: 4.2 mmol/L (ref 3.5–5.1)
SODIUM: 142 mmol/L (ref 135–145)

## 2017-11-13 LAB — CBC
HCT: 32.8 % — ABNORMAL LOW (ref 36.0–46.0)
HEMOGLOBIN: 9.9 g/dL — AB (ref 12.0–15.0)
MCH: 31.4 pg (ref 26.0–34.0)
MCHC: 30.2 g/dL (ref 30.0–36.0)
MCV: 104.1 fL — ABNORMAL HIGH (ref 80.0–100.0)
PLATELETS: 229 10*3/uL (ref 150–400)
RBC: 3.15 MIL/uL — AB (ref 3.87–5.11)
RDW: 17 % — ABNORMAL HIGH (ref 11.5–15.5)
WBC: 5.2 10*3/uL (ref 4.0–10.5)
nRBC: 0 % (ref 0.0–0.2)

## 2017-11-13 NOTE — Progress Notes (Signed)
PCP: Dr. Lucianne Lei Cardiologist: Dr. Adora Fridge A-fib clinic: Roderic Palau, NP Nephrologist: Dr Marcello Moores   Dialysis  M-W-F  Pt. Has diateck cath, states graft in left arm doesn't work  Pt. Receives chemo every 3 weeks--last infusion 11/06/17

## 2017-11-14 NOTE — Anesthesia Preprocedure Evaluation (Addendum)
Anesthesia Evaluation  Patient identified by MRN, date of birth, ID band Patient awake    Reviewed: Allergy & Precautions, NPO status , Patient's Chart, lab work & pertinent test results  Airway Mallampati: II  TM Distance: >3 FB Neck ROM: Full    Dental  (+) Dental Advisory Given, Partial Lower, Partial Upper   Pulmonary neg pulmonary ROS,    Pulmonary exam normal breath sounds clear to auscultation       Cardiovascular hypertension, Pt. on medications (-) angina+CHF  (-) CAD and (-) Past MI Normal cardiovascular exam+ dysrhythmias Atrial Fibrillation + Valvular Problems/Murmurs  Rhythm:Regular Rate:Normal     Neuro/Psych negative neurological ROS  negative psych ROS   GI/Hepatic Neg liver ROS, GERD  Medicated and Controlled,  Endo/Other  negative endocrine ROSObesity   Renal/GU ESRF and DialysisRenal disease (MWF)K+ 3.5     Musculoskeletal  (+) Arthritis ,   Abdominal   Peds  Hematology  (+) Blood dyscrasia, anemia ,   Anesthesia Other Findings Day of surgery medications reviewed with the patient.  Right breast cancer   Reproductive/Obstetrics                           Anesthesia Physical Anesthesia Plan  ASA: III  Anesthesia Plan: General   Post-op Pain Management:  Regional for Post-op pain   Induction: Intravenous  PONV Risk Score and Plan: 3 and Midazolam, Ondansetron and Dexamethasone  Airway Management Planned: LMA  Additional Equipment:   Intra-op Plan:   Post-operative Plan: Extubation in OR  Informed Consent: I have reviewed the patients History and Physical, chart, labs and discussed the procedure including the risks, benefits and alternatives for the proposed anesthesia with the patient or authorized representative who has indicated his/her understanding and acceptance.   Dental advisory given  Plan Discussed with: CRNA  Anesthesia Plan Comments:         Anesthesia Quick Evaluation

## 2017-11-14 NOTE — Progress Notes (Signed)
Anesthesia Chart Review:  Case:  [id number] Date/Time:  11/20/17 0715   Procedure:  RIGHT MASTECTOMY WITH SENTINEL LYMPH NODE BIOPSY (Right )   Anesthesia type:  General   Pre-op diagnosis:  RIGHT BREAST CANCER   Location:  [facility] OR ROOM 09 / [place]   Surgeon:  Coralie Keens, MD      DISCUSSION: Patient is a 62 year old female scheduled for the above procedure.   History includes ESRD (s/p renal transplant 08/01/14 with early failure, s/p transplant nephrectomy 04/19/17; on HD MWF via left Diatek, failed LUE AVGG--will f/u with vascular surgery after mastectomy to discuss new HD access), afib/flutter (diagnosed ~ 11/2015, medical management, declined radiofrequency ablation), CHF, murmur, right breast cancer (diagnosed 06/2017; s/p Taxol & Herceptin; on Herceptin maintenance, last 11/06/17 via right PAC), never smoker, exertional dyspnea, GERD, anemia. She reported temporary post-operative memory decline after 2005 surgery.  She will need an ISTAT4 on the day of surgery due to ESRD. Her HGB is stable at 9.9 (previously 9.5 on 10/16/17; last PRBC transfusion 10/04/17 for HGB of 7.2). I'll add a Hold Clot to day of surgery labs (since HGB is < 10), but will defer to surgeon and/or anesthesiologist if they want to order a formal T&S. If labs acceptable and no acute changes then I would anticipate that she can proceed as planned.    VS: BP 115/62   Pulse 83   Temp 36.9 C   Resp 18   Ht 5\' 1"  (1.549 m)   Wt 86.3 kg   SpO2 100%   BMI 35.96 kg/m    PROVIDERS: Lucianne Lei, MD is PCP - Quay Burow, MD is cardiologist. Last visit 10/18/17 with Jory Sims, NP. Patient no longer on metoprolol due to hypotension, but takes diltiazem on HD days). No anticoagulation due to CHADS VASC Score of 1. Plan to repeat echo in 01/2018 (due to chemo).  Alm Bustard, MD is Afib/EP. Last visit 08/23/17. Donato Heinz, MD is nephrologist. She last saw Nolon Lennert, MD at [facility] 05/15/17. Nicholas Lose, MD is HEM-ONC   LABS: Preoperative labs noted.  (all labs ordered are listed, but only abnormal results are displayed)  Labs Reviewed  BASIC METABOLIC PANEL - Abnormal; Notable for the following components:      Result Value   BUN 25 (*)    Creatinine, Ser 5.99 (*)    Calcium 8.6 (*)    GFR calc non Af Amer 7 (*)    GFR calc Af Amer 8 (*)    All other components within normal limits  CBC - Abnormal; Notable for the following components:   RBC 3.15 (*)    Hemoglobin 9.9 (*)    HCT 32.8 (*)    MCV 104.1 (*)    RDW 17.0 (*)    All other components within normal limits   CBC Latest Ref Rng & Units 11/13/2017 10/16/2017 10/09/2017  WBC 4.0 - 10.5 K/uL 5.2 6.1 8.6  Hemoglobin 12.0 - 15.0 g/dL 9.9(L) 9.5(L) 9.7(L)  Hematocrit 36.0 - 46.0 % 32.8(L) 28.1(L) 30.5(L)  Platelets 150 - 400 K/uL 229 266 190    IMAGES: CXR 10/05/17: IMPRESSION: Trace bilateral pleural effusions and mild cardiomegaly. No active pulmonary disease.  CTA chest 10/05/17: IMPRESSION: No evidence of pulmonary emboli. Minimal dependent atelectasis. Small pericardial effusion.  This is improved from the prior exam. Aortic Atherosclerosis (ICD10-I70.0).   EKG: 10/05/17: NSR, minimal voltage criteria for LVH, may be  normal variant.    CV: Echo 07/13/17: Study Conclusions - Left ventricle: The cavity size was normal. Systolic function was   normal. The estimated ejection fraction was in the range of 60%   to 65%. Wall motion was normal; there were no regional wall   motion abnormalities. Doppler parameters are consistent with   abnormal left ventricular relaxation (grade 1 diastolic   dysfunction). Doppler parameters are consistent with   indeterminate ventricular filling pressure. - Aortic valve: There was no regurgitation. - Mitral valve: Transvalvular velocity was within the normal range.   There was no evidence for stenosis. There was no  regurgitation. - Left atrium: The atrium was mildly dilated. - Right ventricle: The cavity size was normal. Wall thickness was   normal. Systolic function was normal. - Tricuspid valve: There was mild regurgitation. - Pulmonary arteries: Systolic pressure was within the normal   range. PA peak pressure: 25 mm Hg (S). - Global longitudinal strain -21.7% (normal).  CT Coronary 01/11/16: IMPRESSION: 1. Coronary calcium score of 40. This was 75 percentile for age and sex matched control. 2. Normal coronary origin with right dominance. 3. Mild non-obstructive CAD (0-25% ostial LM, 25-50% ostial LCX).  Risk factor modification is recommended.  Nuclear stress test 06/08/15: IMPRESSION: 1. No reversible ischemia.  Small scar in the inferolateral wall. 2. Normal left ventricular wall motion. 3. Left ventricular ejection fraction 68% 4. Non invasive risk stratification*: Low   Past Medical History:  Diagnosis Date  . Anemia   . Arthritis    knees  . Breast cancer ([place]) 2019   Right Breast Cancer  . Cancer ([place])   . Chest pain Jan 2016   low risk Myoview   . CHF (congestive heart failure) ([place])   . Complication of anesthesia 2005   difficulty remembering for a while and waking up  . Constipation   . Dysrhythmia    h/o A-Fib  . ESRD on dialysis [facility]) April 2016   MWF  . GERD (gastroesophageal reflux disease)   . Heart murmur Nov 2015   Aortic scleosis- no stenosis  . Hemodialysis patient ([place])   . Hypertension   . Shortness of breath dyspnea    with exertion    Past Surgical History:  Procedure Laterality Date  . ABDOMINAL HYSTERECTOMY  2005  . AV FISTULA PLACEMENT Left 12/09/2013   Procedure: INSERTION OF ARTERIOVENOUS (AV) GORE-TEX GRAFT ARM;  Surgeon: Elam Dutch, MD;  Location: [facility] [place];  Service: Vascular;  Laterality: Left;  . BREAST BIOPSY  1990's  . BUNIONECTOMY Bilateral   . CHOLECYSTECTOMY  2005  . COLONOSCOPY    . IR GENERIC HISTORICAL  01/11/2016   IR  US GUIDE VASC ACCESS RIGHT 01/11/2016 Corrie Mckusick, DO [facility]-INTERV RAD  . IR GENERIC HISTORICAL  01/11/2016   IR RADIOLOGY PERIPHERAL GUIDED IV START 01/11/2016 Corrie Mckusick, DO [facility]  . IR IMAGING GUIDED PORT INSERTION  07/05/2017  . 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 KNEE ARTHROSCOPY Bilateral   . PERIPHERAL VASCULAR CATHETERIZATION N/A 08/31/2015   Procedure: A/V Shuntogram;  Surgeon: Serafina Mitchell, MD;  Location: [place] CV LAB;  Service: Cardiovascular;  Laterality: N/A;  . PERIPHERAL VASCULAR CATHETERIZATION Left 08/31/2015   Procedure: Peripheral Vascular Balloon Angioplasty;  Surgeon: Serafina Mitchell, MD;  Location: [place] CV LAB;  Service: Cardiovascular;  Laterality: Left;  arm fistula  . ROTATOR CUFF REPAIR Right 1997    MEDICATIONS: . acetaminophen (TYLENOL) 500 MG tablet  .  amoxicillin (AMOXIL) 500 MG capsule  . aspirin EC 81 MG tablet  . clobetasol cream (TEMOVATE) 0.05 %  . diltiazem (CARDIZEM) 30 MG tablet  . lidocaine-prilocaine (EMLA) cream  . multivitamin (RENA-VIT) TABS tablet  . ondansetron (ZOFRAN) 8 MG tablet  . pantoprazole (PROTONIX) 40 MG tablet  . prochlorperazine (COMPAZINE) 10 MG tablet  . sevelamer carbonate (RENVELA) 800 MG tablet  . simvastatin (ZOCOR) 20 MG tablet   No current facility-administered medications for this encounter.     [facility]/Anesthesiology Phone [phone] 11/14/2017 11:35 AM

## 2017-11-19 NOTE — H&P (Signed)
Karen [name] Documented: 10/23/2017 11:17 AM Location: [facility] Patient #: [id number] DOB: 1955/03/07 Married / Language: English / Race: Black or African American Female   History of Present Illness (Loran Fleet A. Ninfa Linden MD; 10/23/2017 11:43 AM) The patient is a 62 year old female who presents with breast cancer. She is here for follow-up regarding a right breast cancer. She has been getting neoadjuvant therapy. Again she is on dialysis Monday Wednesday and Fridays. She has been tolerating chemotherapy.  Her follow-up mammogram on October 3 showed an increase in the pleomorphic calcifications measuring now to be a size measuring 6.5 x 3.9 x 2.3 cm.  She is now ready to undergo a right mastectomy. She is currently stable.  Past Medical History:  Diagnosis Date  . Anemia   . Arthritis    knees  . Cancer ([place])   . Chest pain Jan 2016   low risk Myoview   . Complication of anesthesia 2005   difficulty remembering for a while  . Constipation   . ESRD on dialysis [facility]) April 2016   MWF  . GERD (gastroesophageal reflux disease)   . Heart murmur Nov 2015   Aortic scleosis- no stenosis  . Hemodialysis patient ([place])   . Hypertension   . Shortness of breath dyspnea    with exertion        Patient Active Problem List   Diagnosis Date Noted  . Port-A-Cath in place 07/17/2017  . Malignant neoplasm of upper-outer quadrant of right breast in female, estrogen receptor negative ([place]) 07/02/2017  . Cough 03/06/2017  . Elevated troponin I level 06/07/2015  . Paroxysmal atrial fibrillation ([place]) 06/07/2015  . Klebsiella pneumoniae sepsis ([place]) 12/22/2014  . Sinus tachycardia 12/21/2014  . Dyspnea 12/21/2014  . Infection of urinary tract 12/20/2014  . Sepsis ([place]) 12/19/2014  . Fever, unspecified 12/17/2014  . Thrush 12/17/2014  . Erythema induratum 12/17/2014  . Obesity, Class II, BMI 35.0-39.9, with comorbidity (see actual BMI) 11/19/2014  .  Bilateral lower extremity edema 04/28/2014  . Chronic constipation 05/26/2013  . Dyspepsia 08/08/2012  . Chronic nausea 08/08/2012  . History of chest pain 06/12/2012  . End stage renal disease on dialysis [facility]) 07/04/2011         Past Surgical History:  Procedure Laterality Date  . ABDOMINAL HYSTERECTOMY  2005  . AV FISTULA PLACEMENT Left 12/09/2013   Procedure: INSERTION OF ARTERIOVENOUS (AV) GORE-TEX GRAFT ARM;  Surgeon: Elam Dutch, MD;  Location: [place];  Service: Vascular;  Laterality: Left;  . BREAST BIOPSY    . BUNIONECTOMY Bilateral   . CHOLECYSTECTOMY  2005  . COLONOSCOPY    . IR GENERIC HISTORICAL  01/11/2016   IR US GUIDE VASC ACCESS RIGHT 01/11/2016 Corrie Mckusick, DO [facility]INTER[facility]  . IR GENERIC HISTORICAL  01/11/2016   IR RADIOLOGY PERIPHERAL GUIDED IV START 01/11/2016 Corrie Mckusick, DO [facility]  . IR IMAGING GUIDED PORT INSERTION  07/05/2017  . 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 KNEE ARTHROSCOPY Bilateral   . PERIPHERAL VASCULAR CATHETERIZATION N/A 08/31/2015   Procedure: A/V Shuntogram;  Surgeon: Serafina Mitchell, MD;  Location: [place] CV LAB;  Service: Cardiovascular;  Laterality: N/A;  . PERIPHERAL VASCULAR CATHETERIZATION Left 08/31/2015   Procedure: Peripheral Vascular Balloon Angioplasty;  Surgeon: Serafina Mitchell, MD;  Location: [place] CV LAB;  Service: Cardiovascular;  Laterality: Left;  arm fistula  . [place]  Allergies Sabino Gasser; 10/23/2017 11:17 AM) No Known Allergies [06/26/2017]: Allergies Reconciled   Medication History Sabino Gasser; 10/23/2017 11:19 AM) Ondansetron HCl (8MG  Tablet, Oral) Active. Prochlorperazine Maleate (10MG  Tablet, Oral) Active. Renvela (800MG  Tablet, Oral) Active. Cephalexin (500MG  Capsule, Oral) Active. DilTIAZem HCl (30MG  Tablet, Oral) Active. Metoprolol Succinate ER (100MG  Tablet ER 24HR, Oral) Active. Pantoprazole Sodium (40MG   Tablet DR, Oral) Active. Rena-Vite (Oral) Active. Sevelamer Carbonate (800MG  Tablet, Oral) Active. Simvastatin (20MG  Tablet, Oral) Active. Medications Reconciled  Vitals Sabino Gasser; 10/23/2017 11:20 AM) 10/23/2017 11:19 AM Weight: 192.5 lb Height: 61in Body Surface Area: 1.86 m Body Mass Index: 36.37 kg/m  Temp.: 98.44F(Oral)  Pulse: 95 (Regular)  BP: 134/80 (Sitting, Left Arm, Standard)       Physical Exam (Jahrel Borthwick A. Ninfa Linden MD; 10/23/2017 11:44 AM) The physical exam findings are as follows: Note:On exam today she is mildly weak. Lungs clear. Cardiovascular is regular rate and rhythm. Again, I can palpate no masses in the right breast.  I reviewed the most recent mammogram and ultrasound    Assessment & Plan (Lacreasha Hinds A. Ninfa Linden MD; 10/23/2017 11:44 AM) BREAST CANCER, RIGHT (C50.911) Impression: Again, this is a right breast cancer which shows significant calcifications and a large area. I believe she is not a candidate for breast conservation and will need a mastectomy. It is her request mastectomy. She does not want reconstruction. We will proceed with a right mastectomy and sentinel lymph node biopsy. I discussed the procedure with her including the risks. She understands and surgery will be scheduled

## 2017-11-20 ENCOUNTER — Encounter (HOSPITAL_COMMUNITY): Payer: Self-pay | Admitting: Urology

## 2017-11-20 ENCOUNTER — Ambulatory Visit (HOSPITAL_COMMUNITY): Payer: Medicare Other | Admitting: Vascular Surgery

## 2017-11-20 ENCOUNTER — Other Ambulatory Visit: Payer: Self-pay

## 2017-11-20 ENCOUNTER — Ambulatory Visit (HOSPITAL_COMMUNITY)
Admission: RE | Admit: 2017-11-20 | Discharge: 2017-11-20 | Disposition: A | Discharge status: Home / Self Care | Payer: Medicare Other | Source: Ambulatory Visit | Attending: Surgery | Admitting: Surgery

## 2017-11-20 ENCOUNTER — Observation Stay (HOSPITAL_COMMUNITY)
Admission: RE | Admit: 2017-11-20 | Discharge: 2017-11-21 | Disposition: A | Discharge status: Home / Self Care | Payer: Medicare Other | Source: Ambulatory Visit | Attending: Surgery | Admitting: Surgery

## 2017-11-20 ENCOUNTER — Encounter (HOSPITAL_COMMUNITY)
Admission: RE | Disposition: A | Discharge status: Home / Self Care | Payer: Self-pay | Source: Ambulatory Visit | Attending: Surgery

## 2017-11-20 DIAGNOSIS — I132 Hypertensive heart and chronic kidney disease with heart failure and with stage 5 chronic kidney disease, or end stage renal disease: Secondary | ICD-10-CM | POA: Diagnosis not present

## 2017-11-20 DIAGNOSIS — I509 Heart failure, unspecified: Secondary | ICD-10-CM | POA: Diagnosis not present

## 2017-11-20 DIAGNOSIS — Z992 Dependence on renal dialysis: Secondary | ICD-10-CM | POA: Insufficient documentation

## 2017-11-20 DIAGNOSIS — C50411 Malignant neoplasm of upper-outer quadrant of right female breast: Secondary | ICD-10-CM | POA: Diagnosis not present

## 2017-11-20 DIAGNOSIS — Z171 Estrogen receptor negative status [ER-]: Secondary | ICD-10-CM | POA: Insufficient documentation

## 2017-11-20 DIAGNOSIS — Z6836 Body mass index (BMI) 36.0-36.9, adult: Secondary | ICD-10-CM | POA: Diagnosis not present

## 2017-11-20 DIAGNOSIS — M1990 Unspecified osteoarthritis, unspecified site: Secondary | ICD-10-CM | POA: Diagnosis not present

## 2017-11-20 DIAGNOSIS — N186 End stage renal disease: Secondary | ICD-10-CM | POA: Diagnosis not present

## 2017-11-20 DIAGNOSIS — K219 Gastro-esophageal reflux disease without esophagitis: Secondary | ICD-10-CM | POA: Insufficient documentation

## 2017-11-20 DIAGNOSIS — Z853 Personal history of malignant neoplasm of breast: Secondary | ICD-10-CM

## 2017-11-20 DIAGNOSIS — E669 Obesity, unspecified: Secondary | ICD-10-CM | POA: Insufficient documentation

## 2017-11-20 DIAGNOSIS — Z940 Kidney transplant status: Secondary | ICD-10-CM | POA: Diagnosis not present

## 2017-11-20 DIAGNOSIS — Z9011 Acquired absence of right breast and nipple: Secondary | ICD-10-CM

## 2017-11-20 HISTORY — PX: MASTECTOMY W/ SENTINEL NODE BIOPSY: SHX2001

## 2017-11-20 LAB — POCT I-STAT 4, (NA,K, GLUC, HGB,HCT)
GLUCOSE: 93 mg/dL (ref 70–99)
HCT: 27 % — ABNORMAL LOW (ref 36.0–46.0)
HEMOGLOBIN: 9.2 g/dL — AB (ref 12.0–15.0)
Potassium: 3.5 mmol/L (ref 3.5–5.1)
Sodium: 142 mmol/L (ref 135–145)

## 2017-11-20 LAB — SAMPLE TO BLOOD BANK

## 2017-11-20 SURGERY — MASTECTOMY WITH SENTINEL LYMPH NODE BIOPSY
Anesthesia: General | Site: Breast | Laterality: Right

## 2017-11-20 MED ORDER — RENA-VITE PO TABS
1.0000 | ORAL_TABLET | Freq: Every day | ORAL | Status: DC
  Administered 2017-11-20: 1 via ORAL
  Filled 2017-11-20: qty 1

## 2017-11-20 MED ORDER — ONDANSETRON HCL 4 MG/2ML IJ SOLN: 4.0000 mg | Freq: Once | INTRAMUSCULAR | Status: DC | PRN

## 2017-11-20 MED ORDER — SODIUM CHLORIDE 0.9 % IV SOLN
INTRAVENOUS | Status: DC | PRN
  Administered 2017-11-20 (×2): via INTRAVENOUS

## 2017-11-20 MED ORDER — TRAMADOL HCL 50 MG PO TABS: 50.0000 mg | ORAL_TABLET | Freq: Four times a day (QID) | ORAL | Status: DC | PRN

## 2017-11-20 MED ORDER — DEXAMETHASONE SODIUM PHOSPHATE 10 MG/ML IJ SOLN
INTRAMUSCULAR | Status: DC | PRN
  Administered 2017-11-20: 10 mg via INTRAVENOUS

## 2017-11-20 MED ORDER — PHENYLEPHRINE 40 MCG/ML (10ML) SYRINGE FOR IV PUSH (FOR BLOOD PRESSURE SUPPORT)
PREFILLED_SYRINGE | INTRAVENOUS | Status: DC | PRN
  Administered 2017-11-20 (×2): 120 ug via INTRAVENOUS
  Administered 2017-11-20: 80 ug via INTRAVENOUS
  Administered 2017-11-20: 160 ug via INTRAVENOUS
  Administered 2017-11-20: 120 ug via INTRAVENOUS

## 2017-11-20 MED ORDER — FENTANYL CITRATE (PF) 100 MCG/2ML IJ SOLN
INTRAMUSCULAR | Status: DC | PRN
  Administered 2017-11-20 (×4): 50 ug via INTRAVENOUS

## 2017-11-20 MED ORDER — SEVELAMER CARBONATE 800 MG PO TABS: 800.0000 mg | ORAL_TABLET | ORAL | Status: DC

## 2017-11-20 MED ORDER — ACETAMINOPHEN 500 MG PO TABS
ORAL_TABLET | ORAL | Status: AC
  Administered 2017-11-20: 1000 mg via ORAL
  Filled 2017-11-20: qty 2

## 2017-11-20 MED ORDER — SODIUM CHLORIDE 0.9 % IJ SOLN
INTRAMUSCULAR | Status: AC
  Filled 2017-11-20: qty 10

## 2017-11-20 MED ORDER — TECHNETIUM TC 99M SULFUR COLLOID FILTERED
1.0000 | Freq: Once | INTRAVENOUS | Status: AC | PRN
  Administered 2017-11-20: 1 via INTRADERMAL

## 2017-11-20 MED ORDER — PROPOFOL 10 MG/ML IV BOLUS
INTRAVENOUS | Status: AC
  Filled 2017-11-20: qty 40

## 2017-11-20 MED ORDER — PHENYLEPHRINE 40 MCG/ML (10ML) SYRINGE FOR IV PUSH (FOR BLOOD PRESSURE SUPPORT)
PREFILLED_SYRINGE | INTRAVENOUS | Status: AC
  Filled 2017-11-20: qty 10

## 2017-11-20 MED ORDER — CHLORHEXIDINE GLUCONATE CLOTH 2 % EX PADS: 6.0000 | MEDICATED_PAD | Freq: Once | CUTANEOUS | Status: DC

## 2017-11-20 MED ORDER — LIDOCAINE 2% (20 MG/ML) 5 ML SYRINGE
INTRAMUSCULAR | Status: AC
  Filled 2017-11-20: qty 5

## 2017-11-20 MED ORDER — ONDANSETRON HCL 4 MG/2ML IJ SOLN
INTRAMUSCULAR | Status: DC | PRN
  Administered 2017-11-20: 4 mg via INTRAVENOUS

## 2017-11-20 MED ORDER — SEVELAMER CARBONATE 800 MG PO TABS
1600.0000 mg | ORAL_TABLET | Freq: Three times a day (TID) | ORAL | Status: DC
  Administered 2017-11-20 – 2017-11-21 (×4): 1600 mg via ORAL
  Filled 2017-11-20 (×4): qty 2

## 2017-11-20 MED ORDER — PANTOPRAZOLE SODIUM 40 MG PO TBEC
40.0000 mg | DELAYED_RELEASE_TABLET | Freq: Every day | ORAL | Status: DC
  Administered 2017-11-21: 40 mg via ORAL
  Filled 2017-11-20: qty 1

## 2017-11-20 MED ORDER — ONDANSETRON HCL 4 MG/2ML IJ SOLN: 4.0000 mg | Freq: Four times a day (QID) | INTRAMUSCULAR | Status: DC | PRN

## 2017-11-20 MED ORDER — PROPOFOL 10 MG/ML IV BOLUS
INTRAVENOUS | Status: DC | PRN
  Administered 2017-11-20: 130 mg via INTRAVENOUS
  Administered 2017-11-20: 50 mg via INTRAVENOUS

## 2017-11-20 MED ORDER — GABAPENTIN 300 MG PO CAPS
300.0000 mg | ORAL_CAPSULE | ORAL | Status: AC
  Administered 2017-11-20: 300 mg via ORAL

## 2017-11-20 MED ORDER — BUPIVACAINE-EPINEPHRINE (PF) 0.5% -1:200000 IJ SOLN
INTRAMUSCULAR | Status: DC | PRN
  Administered 2017-11-20: 30 mL via PERINEURAL

## 2017-11-20 MED ORDER — DIPHENHYDRAMINE HCL 50 MG/ML IJ SOLN: 12.5000 mg | Freq: Four times a day (QID) | INTRAMUSCULAR | Status: DC | PRN

## 2017-11-20 MED ORDER — ONDANSETRON HCL 4 MG/2ML IJ SOLN
INTRAMUSCULAR | Status: AC
  Filled 2017-11-20: qty 2

## 2017-11-20 MED ORDER — MIDAZOLAM HCL 2 MG/2ML IJ SOLN
INTRAMUSCULAR | Status: AC
  Filled 2017-11-20: qty 2

## 2017-11-20 MED ORDER — ACETAMINOPHEN 500 MG PO TABS
1000.0000 mg | ORAL_TABLET | ORAL | Status: AC
  Administered 2017-11-20: 1000 mg via ORAL

## 2017-11-20 MED ORDER — DIPHENHYDRAMINE HCL 12.5 MG/5ML PO ELIX: 12.5000 mg | ORAL_SOLUTION | Freq: Four times a day (QID) | ORAL | Status: DC | PRN

## 2017-11-20 MED ORDER — GABAPENTIN 300 MG PO CAPS
300.0000 mg | ORAL_CAPSULE | Freq: Two times a day (BID) | ORAL | Status: DC
  Administered 2017-11-20 – 2017-11-21 (×3): 300 mg via ORAL
  Filled 2017-11-20 (×3): qty 1

## 2017-11-20 MED ORDER — FENTANYL CITRATE (PF) 100 MCG/2ML IJ SOLN: 25.0000 ug | INTRAMUSCULAR | Status: DC | PRN

## 2017-11-20 MED ORDER — FENTANYL CITRATE (PF) 250 MCG/5ML IJ SOLN
INTRAMUSCULAR | Status: AC
  Filled 2017-11-20: qty 5

## 2017-11-20 MED ORDER — ONDANSETRON 4 MG PO TBDP: 4.0000 mg | ORAL_TABLET | Freq: Four times a day (QID) | ORAL | Status: DC | PRN

## 2017-11-20 MED ORDER — MORPHINE SULFATE (PF) 2 MG/ML IV SOLN: 1.0000 mg | INTRAVENOUS | Status: DC | PRN

## 2017-11-20 MED ORDER — DEXAMETHASONE SODIUM PHOSPHATE 10 MG/ML IJ SOLN
INTRAMUSCULAR | Status: AC
  Filled 2017-11-20: qty 1

## 2017-11-20 MED ORDER — MIDAZOLAM HCL 5 MG/5ML IJ SOLN
INTRAMUSCULAR | Status: DC | PRN
  Administered 2017-11-20: 1 mg via INTRAVENOUS

## 2017-11-20 MED ORDER — SEVELAMER CARBONATE 800 MG PO TABS: 800.0000 mg | ORAL_TABLET | Freq: Two times a day (BID) | ORAL | Status: DC | PRN

## 2017-11-20 MED ORDER — ENOXAPARIN SODIUM 30 MG/0.3ML ~~LOC~~ SOLN
30.0000 mg | SUBCUTANEOUS | Status: DC
  Administered 2017-11-21: 30 mg via SUBCUTANEOUS
  Filled 2017-11-20: qty 0.3

## 2017-11-20 MED ORDER — 0.9 % SODIUM CHLORIDE (POUR BTL) OPTIME
TOPICAL | Status: DC | PRN
  Administered 2017-11-20: 1000 mL

## 2017-11-20 MED ORDER — LIDOCAINE 2% (20 MG/ML) 5 ML SYRINGE
INTRAMUSCULAR | Status: DC | PRN
  Administered 2017-11-20: 80 mg via INTRAVENOUS

## 2017-11-20 MED ORDER — GABAPENTIN 300 MG PO CAPS
ORAL_CAPSULE | ORAL | Status: AC
  Administered 2017-11-20: 300 mg via ORAL
  Filled 2017-11-20: qty 1

## 2017-11-20 MED ORDER — OXYCODONE HCL 5 MG PO TABS: 5.0000 mg | ORAL_TABLET | ORAL | Status: DC | PRN

## 2017-11-20 MED ORDER — DILTIAZEM HCL 30 MG PO TABS
15.0000 mg | ORAL_TABLET | ORAL | Status: DC
  Administered 2017-11-21: 15 mg via ORAL
  Filled 2017-11-20: qty 0.5

## 2017-11-20 MED ORDER — CEFAZOLIN SODIUM-DEXTROSE 2-4 GM/100ML-% IV SOLN
2.0000 g | INTRAVENOUS | Status: AC
  Administered 2017-11-20: 2 g via INTRAVENOUS

## 2017-11-20 MED ORDER — CEFAZOLIN SODIUM-DEXTROSE 2-4 GM/100ML-% IV SOLN
INTRAVENOUS | Status: AC
  Filled 2017-11-20: qty 100

## 2017-11-20 MED ORDER — SODIUM CHLORIDE 0.9 % IV SOLN: INTRAVENOUS | Status: DC

## 2017-11-20 MED ORDER — METHYLENE BLUE 0.5 % INJ SOLN
INTRAVENOUS | Status: AC
  Filled 2017-11-20: qty 10

## 2017-11-20 SURGICAL SUPPLY — 57 items
APPLIER CLIP 9.375 MED OPEN (MISCELLANEOUS) ×3
BINDER BREAST LRG (GAUZE/BANDAGES/DRESSINGS) IMPLANT
BINDER BREAST XLRG (GAUZE/BANDAGES/DRESSINGS) ×3 IMPLANT
BIOPATCH RED 1 DISK 7.0 (GAUZE/BANDAGES/DRESSINGS) ×2 IMPLANT
BIOPATCH RED 1IN DISK 7.0MM (GAUZE/BANDAGES/DRESSINGS) ×1
CANISTER SUCT 3000ML PPV (MISCELLANEOUS) IMPLANT
CHLORAPREP W/TINT 26ML (MISCELLANEOUS) ×3 IMPLANT
CLIP APPLIE 9.375 MED OPEN (MISCELLANEOUS) ×1 IMPLANT
CONT SPEC 4OZ CLIKSEAL STRL BL (MISCELLANEOUS) ×3 IMPLANT
COVER PROBE W GEL 5X96 (DRAPES) ×3 IMPLANT
COVER SURGICAL LIGHT HANDLE (MISCELLANEOUS) ×3 IMPLANT
COVER WAND RF STERILE (DRAPES) IMPLANT
DERMABOND ADVANCED (GAUZE/BANDAGES/DRESSINGS) ×2
DERMABOND ADVANCED .7 DNX12 (GAUZE/BANDAGES/DRESSINGS) ×1 IMPLANT
DRAIN CHANNEL 19F RND (DRAIN) ×3 IMPLANT
DRAPE LAPAROSCOPIC ABDOMINAL (DRAPES) ×3 IMPLANT
DRAPE UTILITY XL STRL (DRAPES) IMPLANT
DRSG PAD ABDOMINAL 8X10 ST (GAUZE/BANDAGES/DRESSINGS) ×6 IMPLANT
DRSG TEGADERM 2-3/8X2-3/4 SM (GAUZE/BANDAGES/DRESSINGS) ×3 IMPLANT
ELECT REM PT RETURN 9FT ADLT (ELECTROSURGICAL) ×3
ELECTRODE REM PT RTRN 9FT ADLT (ELECTROSURGICAL) ×1 IMPLANT
EVACUATOR SILICONE 100CC (DRAIN) ×3 IMPLANT
GAUZE SPONGE 4X4 12PLY STRL (GAUZE/BANDAGES/DRESSINGS) ×3 IMPLANT
GLOVE BIOGEL PI IND STRL 6.5 (GLOVE) ×1 IMPLANT
GLOVE BIOGEL PI IND STRL 7.5 (GLOVE) ×1 IMPLANT
GLOVE BIOGEL PI INDICATOR 6.5 (GLOVE) ×2
GLOVE BIOGEL PI INDICATOR 7.5 (GLOVE) ×2
GLOVE SURG SIGNA 7.5 PF LTX (GLOVE) ×3 IMPLANT
GLOVE SURG SS PI 6.0 STRL IVOR (GLOVE) ×3 IMPLANT
GOWN STRL REUS W/ TWL LRG LVL3 (GOWN DISPOSABLE) ×1 IMPLANT
GOWN STRL REUS W/ TWL XL LVL3 (GOWN DISPOSABLE) ×1 IMPLANT
GOWN STRL REUS W/TWL LRG LVL3 (GOWN DISPOSABLE) ×2
GOWN STRL REUS W/TWL XL LVL3 (GOWN DISPOSABLE) ×2
KIT BASIN OR (CUSTOM PROCEDURE TRAY) ×3 IMPLANT
KIT TURNOVER KIT B (KITS) ×3 IMPLANT
MANIFOLD NEPTUNE WASTE (CANNULA) ×3 IMPLANT
NEEDLE 18GX1X1/2 (RX/OR ONLY) (NEEDLE) IMPLANT
NEEDLE FILTER BLUNT 18X 1/2SAF (NEEDLE)
NEEDLE FILTER BLUNT 18X1 1/2 (NEEDLE) IMPLANT
NEEDLE HYPO 25GX1X1/2 BEV (NEEDLE) IMPLANT
NS IRRIG 1000ML POUR BTL (IV SOLUTION) ×3 IMPLANT
PACK GENERAL/GYN (CUSTOM PROCEDURE TRAY) ×3 IMPLANT
PAD ARMBOARD 7.5X6 YLW CONV (MISCELLANEOUS) ×3 IMPLANT
PENCIL SMOKE EVACUATOR (MISCELLANEOUS) ×3 IMPLANT
SPECIMEN JAR LARGE (MISCELLANEOUS) ×3 IMPLANT
SPECIMEN JAR X LARGE (MISCELLANEOUS) IMPLANT
STAPLER VISISTAT 35W (STAPLE) IMPLANT
SUT ETHILON 3 0 FSL (SUTURE) ×3 IMPLANT
SUT MON AB 4-0 PC3 18 (SUTURE) ×3 IMPLANT
SUT SILK 2 0 SH (SUTURE) IMPLANT
SUT VIC AB 3-0 SH 18 (SUTURE) ×3 IMPLANT
SUT VIC AB 3-0 SH 27 (SUTURE)
SUT VIC AB 3-0 SH 27XBRD (SUTURE) IMPLANT
SUT VIC AB 3-0 SH 8-18 (SUTURE) ×3 IMPLANT
SYR CONTROL 10ML LL (SYRINGE) IMPLANT
TOWEL OR 17X24 6PK STRL BLUE (TOWEL DISPOSABLE) ×3 IMPLANT
TOWEL OR 17X26 10 PK STRL BLUE (TOWEL DISPOSABLE) IMPLANT

## 2017-11-20 NOTE — Care Management Note (Signed)
Case Management Note  Patient Details  Name: Karen [name] MRN: [id number] Date of Birth: Jun 09, 1955  Subjective/Objective:                    Action/Plan: Received a call from [place] with [facility]. Dr Trevor Mace office has arranged for home health RN,PT,OT at discharge. Tiffany has orders for home health. Tiffany requesting to be notified of patient's discharge date.  Expected Discharge Date:                  Expected Discharge Plan:  [place]  In-House Referral:     Discharge planning Services  CM Consult  Post Acute Care Choice:  Home Health Choice offered to:     DME Arranged:    DME Agency:     HH Arranged:  PT, OT, RN Icard Agency:  [facility]  Status of Service:  Completed, signed off  If discussed at [place] of Stay Meetings, dates discussed:    Additional Comments:  Marilu Favre, RN 11/20/2017, 4:30 PM

## 2017-11-20 NOTE — Anesthesia Postprocedure Evaluation (Signed)
Anesthesia Post Note  Patient: Karen [name]  Procedure(s) Performed: RIGHT MASTECTOMY WITH SENTINEL LYMPH NODE BIOPSY (Right Breast)     Patient location during evaluation: PACU Anesthesia Type: Regional and General Level of consciousness: awake and alert, awake and oriented Pain management: pain level controlled Vital Signs Assessment: post-procedure vital signs reviewed and stable Respiratory status: spontaneous breathing, nonlabored ventilation, respiratory function stable and patient connected to nasal cannula oxygen Cardiovascular status: blood pressure returned to baseline and stable Postop Assessment: no apparent nausea or vomiting Anesthetic complications: no    Last Vitals:  Vitals:   11/20/17 1104 11/20/17 1127  BP:  (!) 160/76  Pulse: 72 73  Resp: 10 16  Temp:  36.5 C  SpO2: 100% 100%    Last Pain:  Vitals:   11/20/17 1127  TempSrc: Oral  PainSc: 0-No pain                 Catalina Gravel

## 2017-11-20 NOTE — Op Note (Addendum)
RIGHT MASTECTOMY WITH DEEP RIGHT AXILLARY SENTINEL LYMPH NODE BIOPSY  Procedure Note  Karen [name] 11/20/2017   Pre-op Diagnosis: RIGHT BREAST CANCER     Post-op Diagnosis: same  Procedure(s): RIGHT MASTECTOMY WITH DEEP RIGHT AXILLARY SENTINEL LYMPH NODE BIOPSY  Surgeon(s): Coralie Keens, MD  Anesthesia: General  Staff:  Circulator: Paulette Blanch, RN Scrub Person: Dollene Cleveland T Circulator Assistant: Seward Grater, RN  Estimated Blood Loss: Minimal               Specimens: sent to path  Indications: This is [name] 62 year old female with right breast cancer.  She has end-stage renal disease on hemodialysis.  She has been receiving neoadjuvant therapy.  Her recent mammogram showed increase in number of calcifications in the breast.  The decision is now been made to proceed with [name] right mastectomy and sentinel lymph node biopsy  Procedure: The patient was brought to the operating room and identified as correct patient.  She already had radioactive isotope injected into the right breast by the radiation technologist.  She is placed in the supine position on the operating room table and general anesthesia was induced.  Her right chest and breast were then prepped and draped in usual sterile fashion.  I made an elliptical incision across the chest moving medial to lateral and incorporating the nipple areolar complex.  I then dissected down to the breast tissue with electrocautery.  I then first dissected the superior skin flaps with electrocautery staying just underneath the skin and dermis and going toward the clavicle and then down to the chest wall.  I then dissected the inferior skin flaps likewise with the cautery staying underneath the dermis and going down to the inframammary ridge.  I then dissected both flaps medial to lateral.  Next I removed the breast tissue from the pectoralis fascia moving medial to lateral with the electrocautery.  Once I reached the axilla and the  neoprobe was brought to the field.  I identified one sentinel lymph node with the neoprobe in the deep right axillary tissue and excised with the cautery.  I again reevaluated the axilla with the neoprobe and found no other increased uptake of radioactive isotope.  No large lymph nodes were palpated as well.  At this point I then completed the mastectomy.  I placed [name] stitch in the breast to mark the medial margin.  The breast was then sent to pathology along with the node.  We then irrigated the chest wall and skin flaps with saline.  Hemostasis appeared to be achieved.  Made [name] separate skin incision and placed [name] 19 [place] Blake drain into the incision.  This was sutured in place with [name] nylon suture.  I then closed the subcutaneous tissue with interrupted 3-0 Vicryl sutures and closed the skin with [name] running 4-0 Monocryl.  Dermabond was then applied.  The patient was next placed in [name] breast binder.  The drain was placed to bulb suction.  Gauze and [name] binder were then applied.  The patient tolerated the procedure well.  All the counts were correct at the end of the procedure.  The patient was then extubated in the operating room and taken in [name] stable condition to the recovery room.          Karen [name]   Date: 11/20/2017  Time: 8:59 AM

## 2017-11-20 NOTE — Anesthesia Procedure Notes (Signed)
Procedure Name: LMA Insertion Date/Time: 11/20/2017 7:37 AM Performed by: Trinna Post., CRNA Pre-anesthesia Checklist: Patient identified, Emergency Drugs available, Suction available, Patient being monitored and Timeout performed Patient Re-evaluated:Patient Re-evaluated prior to induction Oxygen Delivery Method: Circle system utilized Preoxygenation: Pre-oxygenation with 100% oxygen Induction Type: IV induction Ventilation: Mask ventilation without difficulty LMA: LMA inserted LMA Size: 4.0 Number of attempts: 1 Placement Confirmation: positive ETCO2 and breath sounds checked- equal and bilateral Tube secured with: Tape Dental Injury: Teeth and Oropharynx as per pre-operative assessment

## 2017-11-20 NOTE — Transfer of Care (Signed)
Immediate Anesthesia Transfer of Care Note  Patient: Karen [name]  Procedure(s) Performed: RIGHT MASTECTOMY WITH SENTINEL LYMPH NODE BIOPSY (Right Breast)  Patient Location: PACU  Anesthesia Type:GA combined with regional for post-op pain  Level of Consciousness: awake, alert  and oriented  Airway & Oxygen Therapy: Patient Spontanous Breathing and Patient connected to face mask oxygen  Post-op Assessment: Report given to RN and Post -op Vital signs reviewed and stable  Post vital signs: Reviewed and stable  Last Vitals:  Vitals Value Taken Time  BP 145/66 11/20/2017  8:57 AM  Temp    Pulse 87 11/20/2017  8:58 AM  Resp 11 11/20/2017  8:58 AM  SpO2 100 % 11/20/2017  8:58 AM  Vitals shown include unvalidated device data.  Last Pain:  Vitals:   11/20/17 0600  PainSc: 0-No pain         Complications: No apparent anesthesia complications

## 2017-11-20 NOTE — Anesthesia Procedure Notes (Signed)
Anesthesia Regional Block: Pectoralis block   Pre-Anesthetic Checklist: ,, timeout performed, Correct Patient, Correct Site, Correct Laterality, Correct Procedure, Correct Position, site marked, Risks and benefits discussed,  Surgical consent,  Pre-op evaluation,  At surgeon's request and post-op pain management  Laterality: Right  Prep: chloraprep       Needles:  Injection technique: Single-shot  Needle Type: Echogenic Needle     Needle Length: 9cm  Needle Gauge: 21     Additional Needles:   Procedures:,,,, ultrasound used (permanent image in chart),,,,  Narrative:  Start time: 11/20/2017 7:08 AM End time: 11/20/2017 7:14 AM Injection made incrementally with aspirations every 5 mL.  Performed by: Personally  Anesthesiologist: Catalina Gravel, MD  Additional Notes: No pain on injection. No increased resistance to injection. Injection made in 5cc increments.  Good needle visualization.  Patient tolerated procedure well.

## 2017-11-20 NOTE — Interval H&P Note (Signed)
History and Physical Interval Note: no change in H and P  11/20/2017 7:01 AM  Karen [name]  has presented today for surgery, with the diagnosis of RIGHT BREAST CANCER  The various methods of treatment have been discussed with the patient and family. After consideration of risks, benefits and other options for treatment, the patient has consented to  Procedure(s): RIGHT MASTECTOMY WITH SENTINEL LYMPH NODE BIOPSY (Right) as a surgical intervention .  The patient's history has been reviewed, patient examined, no change in status, stable for surgery.  I have reviewed the patient's chart and labs.  Questions were answered to the patient's satisfaction.     Peniel Biel A

## 2017-11-21 ENCOUNTER — Encounter (HOSPITAL_COMMUNITY): Payer: Self-pay | Admitting: Surgery

## 2017-11-21 DIAGNOSIS — C50411 Malignant neoplasm of upper-outer quadrant of right female breast: Secondary | ICD-10-CM | POA: Diagnosis not present

## 2017-11-21 LAB — BASIC METABOLIC PANEL
ANION GAP: 9 (ref 5–15)
BUN: 38 mg/dL — ABNORMAL HIGH (ref 8–23)
CO2: 29 mmol/L (ref 22–32)
Calcium: 8.3 mg/dL — ABNORMAL LOW (ref 8.9–10.3)
Chloride: 100 mmol/L (ref 98–111)
Creatinine, Ser: 7.58 mg/dL — ABNORMAL HIGH (ref 0.44–1.00)
GFR calc Af Amer: 6 mL/min — ABNORMAL LOW (ref >60)
GFR calc non Af Amer: 5 mL/min — ABNORMAL LOW (ref >60)
GLUCOSE: 151 mg/dL — AB (ref 70–99)
Potassium: 5.1 mmol/L (ref 3.5–5.1)
Sodium: 138 mmol/L (ref 135–145)

## 2017-11-21 LAB — CBC
HEMATOCRIT: 24 % — AB (ref 36.0–46.0)
Hemoglobin: 7.6 g/dL — ABNORMAL LOW (ref 12.0–15.0)
MCH: 32.1 pg (ref 26.0–34.0)
MCHC: 31.7 g/dL (ref 30.0–36.0)
MCV: 101.3 fL — AB (ref 80.0–100.0)
Platelets: 172 10*3/uL (ref 150–400)
RBC: 2.37 MIL/uL — ABNORMAL LOW (ref 3.87–5.11)
RDW: 15.9 % — AB (ref 11.5–15.5)
WBC: 6.6 10*3/uL (ref 4.0–10.5)
nRBC: 0 % (ref 0.0–0.2)

## 2017-11-21 MED ORDER — TRAMADOL HCL 50 MG PO TABS: 50.0000 mg | ORAL_TABLET | Freq: Four times a day (QID) | ORAL | 0 refills | Status: DC | PRN

## 2017-11-21 NOTE — Discharge Instructions (Signed)
[facility] °[phone] ° °MASTECTOMY: POST OP INSTRUCTIONS ° °Always review your discharge instruction sheet given to you by the facility where your surgery was performed. °IF YOU HAVE DISABILITY OR FAMILY LEAVE FORMS, YOU MUST BRING THEM TO THE OFFICE FOR PROCESSING.   °DO NOT GIVE THEM TO YOUR DOCTOR. °A prescription for pain medication may be given to you upon discharge.  Take your pain medication as prescribed, if needed.  If narcotic pain medicine is not needed, then you may take acetaminophen (Tylenol) or ibuprofen (Advil) as needed. °1. Take your usually prescribed medications unless otherwise directed. °2. If you need a refill on your pain medication, please contact your pharmacy.  They will contact our office to request authorization.  Prescriptions will not be filled after 5pm or on week-ends. °3. You should follow a light diet the first few days after arrival home, such as soup and crackers, etc.  Resume your normal diet the day after surgery. °4. Most patients will experience some swelling and bruising on the chest and underarm.  Ice packs will help.  Swelling and bruising can take several days to resolve.  °5. It is common to experience some constipation if taking pain medication after surgery.  Increasing fluid intake and taking a stool softener (such as Colace) will usually help or prevent this problem from occurring.  A mild laxative (Milk of Magnesia or Miralax) should be taken according to package instructions if there are no bowel movements after 48 hours. °6. Unless discharge instructions indicate otherwise, leave your bandage dry and in place until your next appointment in 3-5 days.  You may take a limited sponge bath.  No tube baths or showers until the drains are removed.  You may have steri-strips (small skin tapes) in place directly over the incision.  These strips should be left on the skin for 7-10 days.  If your surgeon used skin glue on the incision, you may  shower in 24 hours.  The glue will flake off over the next 2-3 weeks.  Any sutures or staples will be removed at the office during your follow-up visit. °7. DRAINS:  If you have drains in place, it is important to keep a list of the amount of drainage produced each day in your drains.  Before leaving the hospital, you should be instructed on drain care.  Call our office if you have any questions about your drains. °8. ACTIVITIES:  You may resume regular (light) daily activities beginning the next day--such as daily self-care, walking, climbing stairs--gradually increasing activities as tolerated.  You may have sexual intercourse when it is comfortable.  Refrain from any heavy lifting or straining until approved by your doctor. °a. You may drive when you are no longer taking prescription pain medication, you can comfortably wear a seatbelt, and you can safely maneuver your car and apply brakes. °b. RETURN TO WORK:  __________________________________________________________ °9. You should see your doctor in the office for a follow-up appointment approximately 3-5 days after your surgery.  Your doctor’s nurse will typically make your follow-up appointment when she calls you with your pathology report.  Expect your pathology report 2-3 business days after your surgery.  You may call to check if you do not hear from us after three days.   °10. OTHER INSTRUCTIONS: ______________________________________________________________________________________________ ____________________________________________________________________________________________ °WHEN TO CALL YOUR DOCTOR: °1. Fever over 101.0 °2. Nausea and/or vomiting °3. Extreme swelling or bruising °4. Continued bleeding from incision. °5. Increased pain, redness, or drainage from the incision. °  The clinic staff is available to answer your questions during regular business hours.  Please don’t hesitate to call and ask to speak to one of the nurses for clinical  concerns.  If you have a medical emergency, go to the nearest emergency room or call 911.  A surgeon from [facility] is always on call at the hospital. °[address], [address], [place], [place]  [address] ? [address] [address], [place], [place]   [address] °[phone] ? [phone] ? FAX [phone] °Web site: [email or web address] °

## 2017-11-21 NOTE — Care Management (Signed)
See previous note. Left Rhys Martini with [facility] voicemail that patient is discharging today.   Magdalen Spatz RN BSN [phone]

## 2017-11-21 NOTE — Progress Notes (Signed)
Patient ID: Karen [name], female   DOB: 1955-11-08, 62 y.o.   MRN: [id number]   Doing well No pain No hematoma at mastectomy site  Plan: discharge home

## 2017-11-21 NOTE — Discharge Summary (Signed)
Physician Discharge Summary  Patient ID: Karen [name] MRN: [id number] DOB/AGE: 1955/08/12 62 y.o.  Admit date: 11/20/2017 Discharge date: 11/21/2017  Admission Diagnoses:  Discharge Diagnoses:  Active Problems:   S/P mastectomy, right right breast cancer  Discharged Condition: good  Hospital Course: uneventful post op recovery.  Discharged home POD#1  Consults: None  Significant Diagnostic Studies:   Treatments: surgery: right mastectomy with sentinel node biopsy  Discharge Exam: Blood pressure 110/71, pulse 73, temperature 98 F (36.7 C), temperature source Oral, resp. rate 14, SpO2 99 %. General appearance: alert, cooperative and no distress Resp: clear to auscultation bilaterally Incision/Wound:no hematoma at mastectomy site.  Drain serosang  Disposition: Discharge disposition: 01-Home or Self Care        Allergies as of 11/21/2017   No Known Allergies     Medication List    TAKE these medications   acetaminophen 500 MG tablet Commonly known as:  TYLENOL Take 1,000 mg by mouth daily as needed for mild pain, moderate pain or headache.   amoxicillin 500 MG capsule Commonly known as:  AMOXIL Take 2,000 mg by mouth See admin instructions. Take 4 capsules (2000 mg) by mouth one hour prior to dental procedures   aspirin EC 81 MG tablet Take 81 mg by mouth daily.   clobetasol cream 0.05 % Commonly known as:  TEMOVATE Apply 1 application topically 2 (two) times daily.   diltiazem 30 MG tablet Commonly known as:  CARDIZEM Take 15 mg by mouth every Monday, Wednesday, and Friday.   lidocaine-prilocaine cream Commonly known as:  EMLA Apply 1 application topically See admin instructions. Apply topically Monday, Wednesday and Friday before dialysis   multivitamin Tabs tablet Take 1 tablet by mouth daily.   ondansetron 8 MG tablet Commonly known as:  ZOFRAN Take 1 tablet (8 mg total) by mouth 2 (two) times daily as needed (Nausea or vomiting).    pantoprazole 40 MG tablet Commonly known as:  PROTONIX TAKE 1 TABLET (40 MG TOTAL) BY MOUTH DAILY. What changed:  See the new instructions.   prochlorperazine 10 MG tablet Commonly known as:  COMPAZINE Take 1 tablet (10 mg total) by mouth every 6 (six) hours as needed (Nausea or vomiting).   sevelamer carbonate 800 MG tablet Commonly known as:  RENVELA Take 800-1,600 mg by mouth See admin instructions. 1600mg  three times daily and 800mg  twice daily with snacks   simvastatin 20 MG tablet Commonly known as:  ZOCOR TAKE 1 TABLET BY MOUTH EVERYDAY AT BEDTIME What changed:  See the new instructions.   traMADol 50 MG tablet Commonly known as:  ULTRAM Take 1 tablet (50 mg total) by mouth every 6 (six) hours as needed for moderate pain or severe pain.      Follow-up Information    [facility] .   Contact information: [place], [facility].   Specialty:  Home Health Services Contact information: [place] [address] [phone]        Coralie Keens, MD. Call on 12/06/2017.   Specialty:  General Surgery Contact information: [address] [place] [address] [phone]           Signed: Harl Bowie 11/21/2017, 8:10 AM

## 2017-11-21 NOTE — Progress Notes (Signed)
Patient discharged to home with instructions and prescriptions. 

## 2017-11-23 NOTE — Progress Notes (Signed)
Patient Care Team: Lucianne Lei, MD as PCP - General (Family Medicine) Lorretta Harp, MD as PCP - Cardiology (Cardiology) Nicholas Lose, MD as PCP - Hematology/Oncology (Hematology and Oncology) Donato Heinz, MD (Nephrology) Lorretta Harp, MD as Consulting Physician (Cardiology) Pyrtle, Lajuan Lines, MD as Consulting Physician (Gastroenterology)  DIAGNOSIS:    ICD-10-CM   1. Malignant neoplasm of upper-outer quadrant of right breast in female, estrogen receptor negative ([facility]) C50.411    Z17.1     SUMMARY OF ONCOLOGIC HISTORY:   Malignant neoplasm of upper-outer quadrant of right breast in female, estrogen receptor negative ([facility])   06/19/2017 Initial Diagnosis    Right breast skin thickening and heaviness: Screening mammogram detected 2 cm of microcalcifications in the right breast in the posterior third UOQ.?  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  0/1 lymph node negative ER: 0%, negative, PR: 0%, negative, Her2: Positive, ratio 8.69. Ki-67: 30%     CHIEF COMPLIANT: Follow up after right mastectomy and Herceptin Maintenance.   INTERVAL HISTORY: Karen [name] is a 62 y.o. with above-mentioned history of right breast cancer treated with neoadjuvant chemotherapy with Taxol, Herceptin and recent right mastectomy. Plan to complete maintenance Herceptin in 06/2018.   She presents to the clinic today by herself. She notes she feels good about her surgery and her  pathology results. She notes she feels sore from her draining tube, no true pain. She notes she lost her draining bulb so she was able to clamp is for now until she gets another bowl today. She is not longer taking Tramadol as it upsets her stomach.  She would like to use a prosthetic bra after she heals from surgery. She will request a prescription at that time.   REVIEW OF SYSTEMS:   Constitutional: Denies fevers, chills or abnormal weight loss Eyes: Denies blurriness of vision Ears, nose, mouth, throat, and face: Denies mucositis or sore throat Respiratory: Denies cough, dyspnea or wheezes Cardiovascular: Denies palpitation, chest discomfort Gastrointestinal:  Denies nausea, heartburn or change in bowel habits Skin: Denies abnormal skin rashes Lymphatics: Denies new lymphadenopathy or easy bruising Neurological:Denies numbness, tingling or new weaknesses Behavioral/Psych: Mood is stable, no new changes  Extremities: No lower extremity edema Breast: denies any pain or lumps or nodules in either breasts All other systems were reviewed with the patient and are negative. (+) breast soreness from draining tube  I have reviewed the past medical history, past surgical history, social history and family history with the patient and they are unchanged from previous note.  ALLERGIES:  has No Known Allergies.  MEDICATIONS:  Current Outpatient Medications  Medication Sig Dispense Refill  . acetaminophen (TYLENOL) 500 MG tablet Take 1,000 mg by mouth daily as needed for mild pain, moderate pain or headache.     Marland Kitchen amoxicillin (AMOXIL) 500 MG capsule Take 2,000 mg by mouth See admin instructions. Take 4 capsules (2000 mg) by mouth one hour prior to dental procedures    .  aspirin EC 81 MG tablet Take 81 mg by mouth daily.    . clobetasol cream (TEMOVATE) 5.28 % Apply 1 application topically 2 (two) times daily.    Marland Kitchen diltiazem (CARDIZEM) 30 MG tablet Take 15 mg by mouth every Monday, Wednesday, and  Friday.     . lidocaine-prilocaine (EMLA) cream Apply 1 application topically See admin instructions. Apply topically Monday, Wednesday and Friday before dialysis  6  . multivitamin (RENA-VIT) TABS tablet Take 1 tablet by mouth daily.    . ondansetron (ZOFRAN) 8 MG tablet Take 1 tablet (8 mg total) by mouth 2 (two) times daily as needed (Nausea or vomiting). 30 tablet 1  . pantoprazole (PROTONIX) 40 MG tablet TAKE 1 TABLET (40 MG TOTAL) BY MOUTH DAILY. (Patient taking differently: Take 40 mg by mouth daily. ) 90 tablet 1  . prochlorperazine (COMPAZINE) 10 MG tablet Take 1 tablet (10 mg total) by mouth every 6 (six) hours as needed (Nausea or vomiting). 30 tablet 1  . sevelamer carbonate (RENVELA) 800 MG tablet Take 800-1,600 mg by mouth See admin instructions. '1600mg'$  three times daily and '800mg'$  twice daily with snacks    . simvastatin (ZOCOR) 20 MG tablet TAKE 1 TABLET BY MOUTH EVERYDAY AT BEDTIME (Patient taking differently: Take 20 mg by mouth at bedtime. ) 90 tablet 1  . traMADol (ULTRAM) 50 MG tablet Take 1 tablet (50 mg total) by mouth every 6 (six) hours as needed for moderate pain or severe pain. 20 tablet 0   No current facility-administered medications for this visit.     PHYSICAL EXAMINATION: ECOG PERFORMANCE STATUS: 1 - Symptomatic but completely ambulatory  Vitals:   11/27/17 0835  BP: (!) 146/70  Pulse: 89  Resp: 18  Temp: 98.9 F (37.2 C)  SpO2: 99%   Filed Weights   11/27/17 0835  Weight: 190 lb 9.6 oz (86.5 kg)    GENERAL:alert, no distress and comfortable SKIN: skin color, texture, turgor are normal, no rashes or significant lesions EYES: normal, Conjunctiva are pink and non-injected, sclera clear OROPHARYNX:no exudate, no erythema and lips, buccal mucosa, and tongue normal  NECK: supple, thyroid normal size, non-tender, without nodularity LYMPH:  no palpable lymphadenopathy in the cervical, axillary or inguinal LUNGS: clear to auscultation and percussion with  normal breathing effort HEART: regular rate & rhythm and no murmurs and no lower extremity edema ABDOMEN:abdomen soft, non-tender and normal bowel sounds MUSCULOSKELETAL:no cyanosis of digits and no clubbing  NEURO: alert & oriented x 3 with fluent speech, no focal motor/sensory deficits EXTREMITIES: No lower extremity edema   LABORATORY DATA:  I have reviewed the data as listed CMP Latest Ref Rng & Units 11/21/2017 11/20/2017 11/13/2017  Glucose 70 - 99 mg/dL 151(H) 93 81  BUN 8 - 23 mg/dL 38(H) - 25(H)  Creatinine 0.44 - 1.00 mg/dL 7.58(H) - 5.99(H)  Sodium 135 - 145 mmol/L 138 142 142  Potassium 3.5 - 5.1 mmol/L 5.1 3.5 4.2  Chloride 98 - 111 mmol/L 100 - 102  CO2 22 - 32 mmol/L 29 - 28  Calcium 8.9 - 10.3 mg/dL 8.3(L) - 8.6(L)  Total Protein 6.5 - 8.1 g/dL - - -  Total Bilirubin 0.3 - 1.2 mg/dL - - -  Alkaline Phos 38 - 126 U/L - - -  AST 15 - 41 U/L - - -  ALT 0 - 44 U/L - - -    Lab Results  Component Value Date   WBC 5.4 11/27/2017   HGB 8.0 (L) 11/27/2017  HCT 25.0 (L) 11/27/2017   MCV 102.9 (H) 11/27/2017   PLT 178 11/27/2017   NEUTROABS 3.3 11/27/2017    ASSESSMENT & PLAN:  Malignant neoplasm of upper-outer quadrant of right breast in female, estrogen receptor negative ([place] [facility]) 06/19/2017: Right breast skin thickening and heaviness: Screening mammogram detected 2 cm of microcalcifications in the right breast in the posterior third UOQ.? 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  0/1 lymph node negative ER: 0%, negative, PR: 0%, negative, Her2: Positive, ratio 8.69. Ki-67: 30%  Pathology counseling: I discussed the final pathology report of the patient provided  a copy of this report. I discussed the margins as well as lymph node  surgeries. We also discussed the final staging along with previously performed ER/PR and HER-2/neu testing.  Recommendation: Herceptin maintenance to complete 1 year of total therapy.  Return to clinic every 3 weeks for Herceptin every 6 weeks of follow-up with me with labs    No orders of the defined types were placed in this encounter.  The patient has a good understanding of the overall plan. she agrees with it. she will call with any problems that may develop before the next visit here.  Nicholas Lose, MD 11/27/2017  Oneal Deputy, am acting as scribe for Truitt Merle, MD.   I have reviewed the above documentation for accuracy and completeness, and I agree with the above.

## 2017-11-26 ENCOUNTER — Telehealth: Payer: Self-pay

## 2017-11-26 NOTE — Telephone Encounter (Signed)
Pt called to report that she recently had a right mastectomy, beginning of the month and is still feeling very sore. She is unsure if she is going to be able to get her herceptin treatment tomorrow through her port, due to discomfort. Pt does see Dr.Gudena prior to her infusion. Advised that pt get her labs peripherally. 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 She is meeting with her kidney dr today and will ask if she is allowed to have IV access on the L arm. She states that she is a very hard stick as well.   Told pt that we can see her prior to getting labs tomorrow and have her discuss treatment with Dr.Gudena. Did advice pt to make sure she applies her emla cream on her port, in case she agrees to get accessed.

## 2017-11-27 ENCOUNTER — Inpatient Hospital Stay: Payer: Medicare Other

## 2017-11-27 ENCOUNTER — Telehealth: Payer: Self-pay | Admitting: Hematology and Oncology

## 2017-11-27 ENCOUNTER — Encounter: Payer: Self-pay | Admitting: *Deleted

## 2017-11-27 ENCOUNTER — Inpatient Hospital Stay (HOSPITAL_BASED_OUTPATIENT_CLINIC_OR_DEPARTMENT_OTHER): Payer: Medicare Other | Admitting: Hematology and Oncology

## 2017-11-27 ENCOUNTER — Inpatient Hospital Stay: Payer: Medicare Other | Attending: Hematology and Oncology

## 2017-11-27 DIAGNOSIS — Z171 Estrogen receptor negative status [ER-]: Secondary | ICD-10-CM | POA: Diagnosis not present

## 2017-11-27 DIAGNOSIS — Z5112 Encounter for antineoplastic immunotherapy: Secondary | ICD-10-CM | POA: Diagnosis present

## 2017-11-27 DIAGNOSIS — C50411 Malignant neoplasm of upper-outer quadrant of right female breast: Secondary | ICD-10-CM | POA: Diagnosis not present

## 2017-11-27 DIAGNOSIS — Z9011 Acquired absence of right breast and nipple: Secondary | ICD-10-CM | POA: Insufficient documentation

## 2017-11-27 DIAGNOSIS — Z79899 Other long term (current) drug therapy: Secondary | ICD-10-CM | POA: Insufficient documentation

## 2017-11-27 DIAGNOSIS — Z95828 Presence of other vascular implants and grafts: Secondary | ICD-10-CM

## 2017-11-27 LAB — CBC WITH DIFFERENTIAL (CANCER CENTER ONLY)
Abs Immature Granulocytes: 0.03 10*3/uL (ref 0.00–0.07)
Basophils Absolute: 0.1 10*3/uL (ref 0.0–0.1)
Basophils Relative: 1 %
EOS ABS: 0.4 10*3/uL (ref 0.0–0.5)
EOS PCT: 8 %
HCT: 25 % — ABNORMAL LOW (ref 36.0–46.0)
HEMOGLOBIN: 8 g/dL — AB (ref 12.0–15.0)
IMMATURE GRANULOCYTES: 1 %
LYMPHS ABS: 1 10*3/uL (ref 0.7–4.0)
Lymphocytes Relative: 18 %
MCH: 32.9 pg (ref 26.0–34.0)
MCHC: 32 g/dL (ref 30.0–36.0)
MCV: 102.9 fL — AB (ref 80.0–100.0)
MONOS PCT: 11 %
Monocytes Absolute: 0.6 10*3/uL (ref 0.1–1.0)
NEUTROS ABS: 3.3 10*3/uL (ref 1.7–7.7)
NEUTROS PCT: 61 %
Platelet Count: 178 10*3/uL (ref 150–400)
RBC: 2.43 MIL/uL — ABNORMAL LOW (ref 3.87–5.11)
RDW: 15.9 % — ABNORMAL HIGH (ref 11.5–15.5)
WBC: 5.4 10*3/uL (ref 4.0–10.5)
nRBC: 0 % (ref 0.0–0.2)

## 2017-11-27 LAB — CMP (CANCER CENTER ONLY)
ALT: <6 U/L (ref 0–44)
ANION GAP: 15 (ref 5–15)
AST: 17 U/L (ref 15–41)
Albumin: 3.3 g/dL — ABNORMAL LOW (ref 3.5–5.0)
Alkaline Phosphatase: 147 U/L — ABNORMAL HIGH (ref 38–126)
BILIRUBIN TOTAL: 0.4 mg/dL (ref 0.3–1.2)
BUN: 33 mg/dL — ABNORMAL HIGH (ref 8–23)
CHLORIDE: 101 mmol/L (ref 98–111)
CO2: 28 mmol/L (ref 22–32)
Calcium: 8.6 mg/dL — ABNORMAL LOW (ref 8.9–10.3)
Creatinine: 6.56 mg/dL (ref 0.44–1.00)
GFR, EST AFRICAN AMERICAN: 7 mL/min — AB (ref >60)
GFR, EST NON AFRICAN AMERICAN: 6 mL/min — AB (ref >60)
Glucose, Bld: 91 mg/dL (ref 70–99)
POTASSIUM: 4.1 mmol/L (ref 3.5–5.1)
Sodium: 144 mmol/L (ref 135–145)
TOTAL PROTEIN: 6.8 g/dL (ref 6.5–8.1)

## 2017-11-27 MED ORDER — SODIUM CHLORIDE 0.9 % IV SOLN
Freq: Once | INTRAVENOUS | Status: AC
  Administered 2017-11-27: 10:00:00 via INTRAVENOUS
  Filled 2017-11-27: qty 250

## 2017-11-27 MED ORDER — ACETAMINOPHEN 325 MG PO TABS
650.0000 mg | ORAL_TABLET | Freq: Once | ORAL | Status: AC
  Administered 2017-11-27: 650 mg via ORAL

## 2017-11-27 MED ORDER — DIPHENHYDRAMINE HCL 25 MG PO CAPS
50.0000 mg | ORAL_CAPSULE | Freq: Once | ORAL | Status: AC
  Administered 2017-11-27: 50 mg via ORAL

## 2017-11-27 MED ORDER — SODIUM CHLORIDE 0.9% FLUSH
10.0000 mL | INTRAVENOUS | Status: DC | PRN
  Administered 2017-11-27: 10 mL
  Filled 2017-11-27: qty 10

## 2017-11-27 MED ORDER — HEPARIN SOD (PORK) LOCK FLUSH 100 UNIT/ML IV SOLN
500.0000 [IU] | Freq: Once | INTRAVENOUS | Status: DC | PRN
  Filled 2017-11-27: qty 5

## 2017-11-27 MED ORDER — HEPARIN SOD (PORK) LOCK FLUSH 100 UNIT/ML IV SOLN
500.0000 [IU] | Freq: Once | INTRAVENOUS | Status: AC | PRN
  Administered 2017-11-27: 500 [IU]
  Filled 2017-11-27: qty 5

## 2017-11-27 MED ORDER — ACETAMINOPHEN 325 MG PO TABS
ORAL_TABLET | ORAL | Status: AC
  Filled 2017-11-27: qty 2

## 2017-11-27 MED ORDER — DIPHENHYDRAMINE HCL 25 MG PO CAPS
ORAL_CAPSULE | ORAL | Status: AC
  Filled 2017-11-27: qty 2

## 2017-11-27 MED ORDER — TRASTUZUMAB CHEMO 150 MG IV SOLR
6.0000 mg/kg | Freq: Once | INTRAVENOUS | Status: AC
  Administered 2017-11-27: 525 mg via INTRAVENOUS
  Filled 2017-11-27: qty 25

## 2017-11-27 NOTE — Progress Notes (Signed)
Pt scheduled for echo in January.

## 2017-11-27 NOTE — Assessment & Plan Note (Signed)
06/19/2017: Right breast skin thickening and heaviness: Screening mammogram detected 2 cm of microcalcifications in the right breast in the posterior third UOQ.? 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  0/1 lymph node negative ER: 0%, negative, PR: 0%, negative, Her2: Positive, ratio 8.69. Ki-67: 30%  Pathology counseling: I discussed the final pathology report of the patient provided  a copy of this report. I discussed the margins as well as lymph node surgeries. We also discussed the final staging along with previously performed ER/PR and HER-2/neu testing.  Recommendation: Herceptin maintenance to complete 1 year of total therapy.  Return to clinic every 3 weeks for Herceptin every 6 weeks of follow-up with me with labs 

## 2017-11-27 NOTE — Patient Instructions (Signed)
[facility] Discharge Instructions for Patients Receiving Chemotherapy  Today you received the following chemotherapy agents Herceptin  To help prevent nausea and vomiting after your treatment, we encourage you to take your nausea medication as directed   If you develop nausea and vomiting that is not controlled by your nausea medication, call the clinic.   BELOW ARE SYMPTOMS THAT SHOULD BE REPORTED IMMEDIATELY:  *FEVER GREATER THAN 100.5 F  *CHILLS WITH OR WITHOUT FEVER  NAUSEA AND VOMITING THAT IS NOT CONTROLLED WITH YOUR NAUSEA MEDICATION  *UNUSUAL SHORTNESS OF BREATH  *UNUSUAL BRUISING OR BLEEDING  TENDERNESS IN MOUTH AND THROAT WITH OR WITHOUT PRESENCE OF ULCERS  *URINARY PROBLEMS  *BOWEL PROBLEMS  UNUSUAL RASH Items with * indicate a potential emergency and should be followed up as soon as possible.  Feel free to call the clinic should you have any questions or concerns. The clinic phone number is [phone].  Please show the CHEMO ALERT CARD at check-in to the Emergency Department and triage nurse.   

## 2017-11-27 NOTE — Telephone Encounter (Signed)
Per 11/12 los.

## 2017-12-18 ENCOUNTER — Inpatient Hospital Stay: Payer: Medicare Other | Attending: Hematology and Oncology

## 2017-12-18 VITALS — BP 138/87 | HR 78 | Temp 98.6°F | Resp 17

## 2017-12-18 DIAGNOSIS — Z5112 Encounter for antineoplastic immunotherapy: Secondary | ICD-10-CM | POA: Insufficient documentation

## 2017-12-18 DIAGNOSIS — Z171 Estrogen receptor negative status [ER-]: Secondary | ICD-10-CM | POA: Insufficient documentation

## 2017-12-18 DIAGNOSIS — C50411 Malignant neoplasm of upper-outer quadrant of right female breast: Secondary | ICD-10-CM

## 2017-12-18 DIAGNOSIS — Z79899 Other long term (current) drug therapy: Secondary | ICD-10-CM | POA: Diagnosis not present

## 2017-12-18 MED ORDER — ACETAMINOPHEN 325 MG PO TABS
ORAL_TABLET | ORAL | Status: AC
  Filled 2017-12-18: qty 2

## 2017-12-18 MED ORDER — SODIUM CHLORIDE 0.9 % IV SOLN
Freq: Once | INTRAVENOUS | Status: AC
  Administered 2017-12-18: 10:00:00 via INTRAVENOUS
  Filled 2017-12-18: qty 250

## 2017-12-18 MED ORDER — ACETAMINOPHEN 325 MG PO TABS
650.0000 mg | ORAL_TABLET | Freq: Once | ORAL | Status: AC
  Administered 2017-12-18: 650 mg via ORAL

## 2017-12-18 MED ORDER — HEPARIN SOD (PORK) LOCK FLUSH 100 UNIT/ML IV SOLN
500.0000 [IU] | Freq: Once | INTRAVENOUS | Status: AC | PRN
  Administered 2017-12-18: 500 [IU]
  Filled 2017-12-18: qty 5

## 2017-12-18 MED ORDER — TRASTUZUMAB CHEMO 150 MG IV SOLR
6.0000 mg/kg | Freq: Once | INTRAVENOUS | Status: AC
  Administered 2017-12-18: 525 mg via INTRAVENOUS
  Filled 2017-12-18: qty 25

## 2017-12-18 MED ORDER — DIPHENHYDRAMINE HCL 25 MG PO CAPS
ORAL_CAPSULE | ORAL | Status: AC
  Filled 2017-12-18: qty 2

## 2017-12-18 MED ORDER — DIPHENHYDRAMINE HCL 25 MG PO CAPS
50.0000 mg | ORAL_CAPSULE | Freq: Once | ORAL | Status: AC
  Administered 2017-12-18: 50 mg via ORAL

## 2017-12-18 MED ORDER — SODIUM CHLORIDE 0.9% FLUSH
10.0000 mL | INTRAVENOUS | Status: DC | PRN
  Administered 2017-12-18: 10 mL
  Filled 2017-12-18: qty 10

## 2017-12-18 NOTE — Patient Instructions (Signed)
[facility] Discharge Instructions for Patients Receiving Chemotherapy  Today you received the following chemotherapy agents Herceptin  To help prevent nausea and vomiting after your treatment, we encourage you to take your nausea medication as directed   If you develop nausea and vomiting that is not controlled by your nausea medication, call the clinic.   BELOW ARE SYMPTOMS THAT SHOULD BE REPORTED IMMEDIATELY:  *FEVER GREATER THAN 100.5 F  *CHILLS WITH OR WITHOUT FEVER  NAUSEA AND VOMITING THAT IS NOT CONTROLLED WITH YOUR NAUSEA MEDICATION  *UNUSUAL SHORTNESS OF BREATH  *UNUSUAL BRUISING OR BLEEDING  TENDERNESS IN MOUTH AND THROAT WITH OR WITHOUT PRESENCE OF ULCERS  *URINARY PROBLEMS  *BOWEL PROBLEMS  UNUSUAL RASH Items with * indicate a potential emergency and should be followed up as soon as possible.  Feel free to call the clinic should you have any questions or concerns. The clinic phone number is [phone].  Please show the CHEMO ALERT CARD at check-in to the Emergency Department and triage nurse.   

## 2017-12-20 ENCOUNTER — Encounter: Payer: Self-pay | Admitting: Vascular Surgery

## 2017-12-20 ENCOUNTER — Ambulatory Visit (INDEPENDENT_AMBULATORY_CARE_PROVIDER_SITE_OTHER): Payer: Medicare Other | Admitting: Vascular Surgery

## 2017-12-20 ENCOUNTER — Other Ambulatory Visit: Payer: Self-pay | Admitting: *Deleted

## 2017-12-20 ENCOUNTER — Encounter: Payer: Self-pay | Admitting: *Deleted

## 2017-12-20 ENCOUNTER — Other Ambulatory Visit: Payer: Self-pay

## 2017-12-20 VITALS — BP 123/76 | HR 72 | Temp 97.2°F | Resp 20 | Ht 61.0 in | Wt 190.0 lb

## 2017-12-20 DIAGNOSIS — N186 End stage renal disease: Secondary | ICD-10-CM | POA: Diagnosis not present

## 2017-12-20 DIAGNOSIS — Z992 Dependence on renal dialysis: Secondary | ICD-10-CM | POA: Diagnosis not present

## 2017-12-20 NOTE — H&P (View-Only) (Signed)
Patient is a 62 year old female who returns for follow-up today.  She previously had a failed left upper arm AV graft.  This was placed in 2015.  She is currently dialyzing via left sided catheter.  She does have a right side Port-A-Cath.  She recently underwent right mastectomy with axillary sentinel node biopsy.  She feels like she has healed up enough and is prepared to proceed with right arm graft at this point.  Her dialysis today is Monday Wednesday Friday.  She denies any swelling in her right arm.  She denies any incisional drainage from her mastectomy.  Pathology showed no residual cancer in her mastectomy or her sentinel node.  She is on chronic Herceptin for chemotherapy.  Review of systems: She is overall fairly deconditioned.  She denies shortness of breath.  She denies chest pain.  Current Outpatient Medications on File Prior to Visit  Medication Sig Dispense Refill  . acetaminophen (TYLENOL) 500 MG tablet Take 1,000 mg by mouth daily as needed for mild pain, moderate pain or headache.     Marland Kitchen aspirin EC 81 MG tablet Take 81 mg by mouth daily.    . clobetasol cream (TEMOVATE) 5.17 % Apply 1 application topically 2 (two) times daily.    Marland Kitchen diltiazem (CARDIZEM) 30 MG tablet Take 15 mg by mouth every Monday, Wednesday, and Friday.     . lidocaine-prilocaine (EMLA) cream Apply 1 application topically See admin instructions. Apply topically Monday, Wednesday and Friday before dialysis  6  . multivitamin (RENA-VIT) TABS tablet Take 1 tablet by mouth daily.    . ondansetron (ZOFRAN) 8 MG tablet Take 1 tablet (8 mg total) by mouth 2 (two) times daily as needed (Nausea or vomiting). 30 tablet 1  . pantoprazole (PROTONIX) 40 MG tablet TAKE 1 TABLET (40 MG TOTAL) BY MOUTH DAILY. (Patient taking differently: Take 40 mg by mouth daily. ) 90 tablet 1  . prochlorperazine (COMPAZINE) 10 MG tablet Take 1 tablet (10 mg total) by mouth every 6 (six) hours as needed (Nausea or vomiting). 30 tablet 1  .  sevelamer carbonate (RENVELA) 800 MG tablet Take 800-1,600 mg by mouth See admin instructions. 1600mg  three times daily and 800mg  twice daily with snacks    . simvastatin (ZOCOR) 20 MG tablet TAKE 1 TABLET BY MOUTH EVERYDAY AT BEDTIME (Patient taking differently: Take 20 mg by mouth at bedtime. ) 90 tablet 1  . amoxicillin (AMOXIL) 500 MG capsule Take 2,000 mg by mouth See admin instructions. Take 4 capsules (2000 mg) by mouth one hour prior to dental procedures     No current facility-administered medications on file prior to visit.    No Known Allergies  Physical exam:  Vitals:   12/20/17 1008  BP: 123/76  Pulse: 72  Resp: 20  Temp: (!) 97.2 F (36.2 C)  SpO2: 97%  Weight: 190 lb (86.2 kg)  Height: 5\' 1"  (1.549 m)    Right chest well-healed mastectomy still fairly recent incision, right side Port-A-Cath  Right upper extremity no significant edema no axillary drainage  Left upper extremity thrombosed left upper arm AV graft  2+ radial pulses bilaterally  Chest: Clear to auscultation bilaterally  Cardiac: Regular rate and rhythm  Assessment: Patient feels ready to proceed at this point with a new access now that she has had her breast cancer completely treated.  Plan: The patient will have a right central venogram to make sure the right axillary vein and central veins are still patent after prior Port-A-Cath and  axillary lymph node sampling.  This is scheduled with Dr. Donzetta Matters on December 24, 2017.  She will also be scheduled for a right upper arm AV graft January 01, 2018 assuming that the central venogram shows patency of the right central veins.  Risk benefits possible complications and procedure details of central venogram as well as placement of right upper arm AV graft were discussed with the patient today these include but not limited to bleeding infection contrast reaction need for other possible procedures of access down the road.  She understands and agrees to  proceed.  Ruta Hinds, MD [facility] [place] Office: [phone] Pager: [phone]

## 2017-12-20 NOTE — Progress Notes (Signed)
Patient is a 62 year old female who returns for follow-up today.  She previously had a failed left upper arm AV graft.  This was placed in 2015.  She is currently dialyzing via left sided catheter.  She does have a right side Port-A-Cath.  She recently underwent right mastectomy with axillary sentinel node biopsy.  She feels like she has healed up enough and is prepared to proceed with right arm graft at this point.  Her dialysis today is Monday Wednesday Friday.  She denies any swelling in her right arm.  She denies any incisional drainage from her mastectomy.  Pathology showed no residual cancer in her mastectomy or her sentinel node.  She is on chronic Herceptin for chemotherapy.  Review of systems: She is overall fairly deconditioned.  She denies shortness of breath.  She denies chest pain.  Current Outpatient Medications on File Prior to Visit  Medication Sig Dispense Refill  . acetaminophen (TYLENOL) 500 MG tablet Take 1,000 mg by mouth daily as needed for mild pain, moderate pain or headache.     Marland Kitchen aspirin EC 81 MG tablet Take 81 mg by mouth daily.    . clobetasol cream (TEMOVATE) 8.92 % Apply 1 application topically 2 (two) times daily.    Marland Kitchen diltiazem (CARDIZEM) 30 MG tablet Take 15 mg by mouth every Monday, Wednesday, and Friday.     . lidocaine-prilocaine (EMLA) cream Apply 1 application topically See admin instructions. Apply topically Monday, Wednesday and Friday before dialysis  6  . multivitamin (RENA-VIT) TABS tablet Take 1 tablet by mouth daily.    . ondansetron (ZOFRAN) 8 MG tablet Take 1 tablet (8 mg total) by mouth 2 (two) times daily as needed (Nausea or vomiting). 30 tablet 1  . pantoprazole (PROTONIX) 40 MG tablet TAKE 1 TABLET (40 MG TOTAL) BY MOUTH DAILY. (Patient taking differently: Take 40 mg by mouth daily. ) 90 tablet 1  . prochlorperazine (COMPAZINE) 10 MG tablet Take 1 tablet (10 mg total) by mouth every 6 (six) hours as needed (Nausea or vomiting). 30 tablet 1  .  sevelamer carbonate (RENVELA) 800 MG tablet Take 800-1,600 mg by mouth See admin instructions. 1600mg  three times daily and 800mg  twice daily with snacks    . simvastatin (ZOCOR) 20 MG tablet TAKE 1 TABLET BY MOUTH EVERYDAY AT BEDTIME (Patient taking differently: Take 20 mg by mouth at bedtime. ) 90 tablet 1  . amoxicillin (AMOXIL) 500 MG capsule Take 2,000 mg by mouth See admin instructions. Take 4 capsules (2000 mg) by mouth one hour prior to dental procedures     No current facility-administered medications on file prior to visit.    No Known Allergies  Physical exam:  Vitals:   12/20/17 1008  BP: 123/76  Pulse: 72  Resp: 20  Temp: (!) 97.2 F (36.2 C)  SpO2: 97%  Weight: 190 lb (86.2 kg)  Height: 5\' 1"  (1.549 m)    Right chest well-healed mastectomy still fairly recent incision, right side Port-A-Cath  Right upper extremity no significant edema no axillary drainage  Left upper extremity thrombosed left upper arm AV graft  2+ radial pulses bilaterally  Chest: Clear to auscultation bilaterally  Cardiac: Regular rate and rhythm  Assessment: Patient feels ready to proceed at this point with a new access now that she has had her breast cancer completely treated.  Plan: The patient will have a right central venogram to make sure the right axillary vein and central veins are still patent after prior Port-A-Cath and  axillary lymph node sampling.  This is scheduled with Dr. Donzetta Matters on December 24, 2017.  She will also be scheduled for a right upper arm AV graft January 01, 2018 assuming that the central venogram shows patency of the right central veins.  Risk benefits possible complications and procedure details of central venogram as well as placement of right upper arm AV graft were discussed with the patient today these include but not limited to bleeding infection contrast reaction need for other possible procedures of access down the road.  She understands and agrees to  proceed.  Ruta Hinds, MD [facility] [place] Office: [phone] Pager: [phone]

## 2017-12-20 NOTE — Progress Notes (Signed)
Message left on nurse station voicemail at [facility].Marland Kitchen Patient to have HD days rescheduled to accommodate procedure on 12/24/2017. Patient states she is going by the center to get HD schedule.

## 2017-12-24 ENCOUNTER — Encounter (HOSPITAL_COMMUNITY)
Admission: RE | Disposition: A | Discharge status: Home / Self Care | Payer: Self-pay | Source: Ambulatory Visit | Attending: Vascular Surgery

## 2017-12-24 ENCOUNTER — Encounter (HOSPITAL_COMMUNITY): Payer: Self-pay | Admitting: Vascular Surgery

## 2017-12-24 ENCOUNTER — Telehealth: Payer: Self-pay | Admitting: Hematology and Oncology

## 2017-12-24 ENCOUNTER — Ambulatory Visit (HOSPITAL_COMMUNITY)
Admission: RE | Admit: 2017-12-24 | Discharge: 2017-12-24 | Disposition: A | Discharge status: Home / Self Care | Payer: Medicare Other | Source: Ambulatory Visit | Attending: Vascular Surgery | Admitting: Vascular Surgery

## 2017-12-24 DIAGNOSIS — N186 End stage renal disease: Secondary | ICD-10-CM | POA: Insufficient documentation

## 2017-12-24 DIAGNOSIS — N185 Chronic kidney disease, stage 5: Secondary | ICD-10-CM

## 2017-12-24 DIAGNOSIS — Z79899 Other long term (current) drug therapy: Secondary | ICD-10-CM | POA: Insufficient documentation

## 2017-12-24 DIAGNOSIS — Z992 Dependence on renal dialysis: Secondary | ICD-10-CM | POA: Diagnosis not present

## 2017-12-24 DIAGNOSIS — Z7982 Long term (current) use of aspirin: Secondary | ICD-10-CM | POA: Insufficient documentation

## 2017-12-24 HISTORY — PX: UPPER EXTREMITY VENOGRAPHY: CATH118272

## 2017-12-24 LAB — POCT I-STAT, CHEM 8
BUN: 60 mg/dL — ABNORMAL HIGH (ref 8–23)
Calcium, Ion: 0.95 mmol/L — ABNORMAL LOW (ref 1.15–1.40)
Chloride: 103 mmol/L (ref 98–111)
Creatinine, Ser: 11.2 mg/dL — ABNORMAL HIGH (ref 0.44–1.00)
GLUCOSE: 82 mg/dL (ref 70–99)
HCT: 24 % — ABNORMAL LOW (ref 36.0–46.0)
Hemoglobin: 8.2 g/dL — ABNORMAL LOW (ref 12.0–15.0)
POTASSIUM: 5 mmol/L (ref 3.5–5.1)
Sodium: 139 mmol/L (ref 135–145)
TCO2: 29 mmol/L (ref 22–32)

## 2017-12-24 SURGERY — UPPER EXTREMITY VENOGRAPHY
Anesthesia: LOCAL | Laterality: Right

## 2017-12-24 MED ORDER — LIDOCAINE HCL (PF) 1 % IJ SOLN
INTRAMUSCULAR | Status: DC | PRN
  Administered 2017-12-24: 2 mL

## 2017-12-24 MED ORDER — IODIXANOL 320 MG/ML IV SOLN
INTRAVENOUS | Status: DC | PRN
  Administered 2017-12-24: 20 mL via INTRAVENOUS

## 2017-12-24 MED ORDER — SODIUM CHLORIDE 0.9% FLUSH: 3.0000 mL | INTRAVENOUS | Status: DC | PRN

## 2017-12-24 MED ORDER — HEPARIN (PORCINE) IN NACL 1000-0.9 UT/500ML-% IV SOLN
INTRAVENOUS | Status: AC
  Filled 2017-12-24: qty 500

## 2017-12-24 MED ORDER — HEPARIN (PORCINE) IN NACL 1000-0.9 UT/500ML-% IV SOLN
INTRAVENOUS | Status: DC | PRN
  Administered 2017-12-24: 500 mL

## 2017-12-24 MED ORDER — FENTANYL CITRATE (PF) 100 MCG/2ML IJ SOLN
INTRAMUSCULAR | Status: AC
  Filled 2017-12-24: qty 2

## 2017-12-24 MED ORDER — MIDAZOLAM HCL 2 MG/2ML IJ SOLN
INTRAMUSCULAR | Status: AC
  Filled 2017-12-24: qty 2

## 2017-12-24 MED ORDER — MIDAZOLAM HCL 2 MG/2ML IJ SOLN
INTRAMUSCULAR | Status: DC | PRN
  Administered 2017-12-24: 1 mg via INTRAVENOUS

## 2017-12-24 MED ORDER — LIDOCAINE HCL (PF) 1 % IJ SOLN
INTRAMUSCULAR | Status: AC
  Filled 2017-12-24: qty 30

## 2017-12-24 MED ORDER — FENTANYL CITRATE (PF) 100 MCG/2ML IJ SOLN
INTRAMUSCULAR | Status: DC | PRN
  Administered 2017-12-24: 25 ug via INTRAVENOUS

## 2017-12-24 SURGICAL SUPPLY — 7 items
BAG SNAP BAND KOVER 36X36 (MISCELLANEOUS) ×2 IMPLANT
COVER DOME SNAP 22 D (MISCELLANEOUS) ×2 IMPLANT
KIT MICROPUNCTURE NIT STIFF (SHEATH) ×2 IMPLANT
KIT PV (KITS) ×2 IMPLANT
TRAY PV CATH (CUSTOM PROCEDURE TRAY) ×2 IMPLANT
TUBING CIL FLEX 10 FLL-RA (TUBING) ×2 IMPLANT
WIRE TORQFLEX AUST .018X40CM (WIRE) ×2 IMPLANT

## 2017-12-24 NOTE — Discharge Instructions (Signed)

## 2017-12-24 NOTE — Progress Notes (Signed)
Pt's creatinine 11.2 via iStat. Hgb 8.2. Pt currently on dialysis and due tomorrow. Hgb 8.0-3 weeks ago. Reported to Rennis Harding, RN cath lab.

## 2017-12-24 NOTE — Telephone Encounter (Signed)
[place] out 12/24. Spoke with [facility] and moved 12/24 appointments to 12/24 - [facility] will see patient. Left message. Schedule mailed.

## 2017-12-24 NOTE — Progress Notes (Signed)
Pt /son states pt. Needs ultrasound for all IV insertions.

## 2017-12-24 NOTE — H&P (Signed)
   History and Physical Update  The patient was interviewed and re-examined.  The patient's previous History and Physical has been reviewed and is unchanged from Dr. Oneida Alar recent office visit. Plan for right arm venogram.   Lezli Danek C. Donzetta Matters, MD [facility] [place] Office: [phone] Pager: [phone]  12/24/2017, 8:42 AM

## 2017-12-24 NOTE — Op Note (Signed)
    Patient name: Karen [name] MRN: [id number] DOB: 03-May-1955 Sex: female  12/24/2017 Pre-operative Diagnosis: End-stage renal disease Post-operative diagnosis:  Same Surgeon:  Erlene Quan C. Donzetta Matters, MD Procedure Performed: 1.  Ultrasound-guided cannulation right arm basilic vein 2.  Right upper extremity venogram 3.  Moderate sedation with fentanyl and Versed for 13 minutes  Indications: 62 year old female has a history of end-stage renal disease previous failed left upper extremity access.  She now is indicated for right upper extremity accesses had a right-sided mastectomy is indicated for venogram.  Findings: There was difficulty getting IV access.  Basilic vein sheath was placed.  Basilic vein was into the deep system which was patent throughout its course.  There is a port as well as a tunnel catheter in place that are non-flow-limiting.    Patient is scheduled for right upper extremity AV graft.   Procedure:  The patient was identified in the holding area and taken to room 8.  The patient was then placed supine on the table and prepped and draped in the usual sterile fashion.  A time out was called.  Ultrasound was used to evaluate the basilic vein on the right.  This was noted to be quite diminutive we anesthetized the area with 1% lidocaine moderate sedation was administered.  We cannulated this directly with micropuncture needle with direct ultrasound guidance and image was saved the permanent record.  Micropuncture sheath was placed.  We performed right upper extremity venography.  The sheath was then removed and pressure held.  She tolerated procedure well without immediate complication.  Contrast: 20cc   Keali Mccraw C. Donzetta Matters, MD [facility] [place] Office: [phone] Pager: [phone]

## 2017-12-31 ENCOUNTER — Other Ambulatory Visit: Payer: Self-pay

## 2017-12-31 ENCOUNTER — Encounter (HOSPITAL_COMMUNITY): Payer: Self-pay | Admitting: *Deleted

## 2017-12-31 NOTE — Progress Notes (Signed)
Pt denies SOB and chest pain. Pt under the care of Dr. Gwenlyn Found, Cardiology. Pt denies having a cardiac cath. Pt made aware to stop taking vitamins, fish oil and herbal medications. Do not take any NSAIDs ie: Ibuprofen, Advil, Naproxen (Aleve), Motrin, BC and Goody Powder. Pt verbalized understanding of all pre-op instructions.

## 2018-01-01 ENCOUNTER — Ambulatory Visit (HOSPITAL_COMMUNITY): Payer: Medicare Other | Admitting: Certified Registered"

## 2018-01-01 ENCOUNTER — Encounter (HOSPITAL_COMMUNITY): Payer: Self-pay | Admitting: Certified Registered"

## 2018-01-01 ENCOUNTER — Encounter (HOSPITAL_COMMUNITY)
Admission: RE | Disposition: A | Discharge status: Home / Self Care | Payer: Self-pay | Source: Home / Self Care | Attending: Vascular Surgery

## 2018-01-01 ENCOUNTER — Ambulatory Visit (HOSPITAL_COMMUNITY)
Admission: RE | Admit: 2018-01-01 | Discharge: 2018-01-01 | Disposition: A | Discharge status: Home / Self Care | Payer: Medicare Other | Attending: Vascular Surgery | Admitting: Vascular Surgery

## 2018-01-01 DIAGNOSIS — N186 End stage renal disease: Secondary | ICD-10-CM | POA: Diagnosis present

## 2018-01-01 DIAGNOSIS — Z7982 Long term (current) use of aspirin: Secondary | ICD-10-CM | POA: Diagnosis not present

## 2018-01-01 DIAGNOSIS — I120 Hypertensive chronic kidney disease with stage 5 chronic kidney disease or end stage renal disease: Secondary | ICD-10-CM | POA: Insufficient documentation

## 2018-01-01 DIAGNOSIS — Z992 Dependence on renal dialysis: Secondary | ICD-10-CM | POA: Insufficient documentation

## 2018-01-01 DIAGNOSIS — Z853 Personal history of malignant neoplasm of breast: Secondary | ICD-10-CM | POA: Insufficient documentation

## 2018-01-01 DIAGNOSIS — Z79899 Other long term (current) drug therapy: Secondary | ICD-10-CM | POA: Insufficient documentation

## 2018-01-01 DIAGNOSIS — N185 Chronic kidney disease, stage 5: Secondary | ICD-10-CM

## 2018-01-01 HISTORY — PX: AV FISTULA PLACEMENT: SHX1204

## 2018-01-01 LAB — POCT I-STAT 4, (NA,K, GLUC, HGB,HCT)
Glucose, Bld: 85 mg/dL (ref 70–99)
HCT: 23 % — ABNORMAL LOW (ref 36.0–46.0)
HEMOGLOBIN: 7.8 g/dL — AB (ref 12.0–15.0)
Potassium: 3.9 mmol/L (ref 3.5–5.1)
Sodium: 141 mmol/L (ref 135–145)

## 2018-01-01 SURGERY — INSERTION OF ARTERIOVENOUS (AV) GORE-TEX GRAFT ARM
Anesthesia: Monitor Anesthesia Care | Laterality: Right

## 2018-01-01 MED ORDER — PHENYLEPHRINE 40 MCG/ML (10ML) SYRINGE FOR IV PUSH (FOR BLOOD PRESSURE SUPPORT)
PREFILLED_SYRINGE | INTRAVENOUS | Status: DC | PRN
  Administered 2018-01-01: 80 ug via INTRAVENOUS

## 2018-01-01 MED ORDER — PROPOFOL 10 MG/ML IV BOLUS
INTRAVENOUS | Status: DC | PRN
  Administered 2018-01-01: 20 mg via INTRAVENOUS
  Administered 2018-01-01: 160 mg via INTRAVENOUS
  Administered 2018-01-01: 20 mg via INTRAVENOUS

## 2018-01-01 MED ORDER — CEFAZOLIN SODIUM-DEXTROSE 2-4 GM/100ML-% IV SOLN
2.0000 g | INTRAVENOUS | Status: AC
  Administered 2018-01-01: 2 g via INTRAVENOUS
  Filled 2018-01-01: qty 100

## 2018-01-01 MED ORDER — LIDOCAINE 2% (20 MG/ML) 5 ML SYRINGE
INTRAMUSCULAR | Status: AC
  Filled 2018-01-01: qty 5

## 2018-01-01 MED ORDER — OXYCODONE-ACETAMINOPHEN 5-325 MG PO TABS: 1.0000 | ORAL_TABLET | Freq: Four times a day (QID) | ORAL | 0 refills | Status: DC | PRN

## 2018-01-01 MED ORDER — FENTANYL CITRATE (PF) 250 MCG/5ML IJ SOLN
INTRAMUSCULAR | Status: AC
  Filled 2018-01-01: qty 5

## 2018-01-01 MED ORDER — LIDOCAINE 2% (20 MG/ML) 5 ML SYRINGE
INTRAMUSCULAR | Status: DC | PRN
  Administered 2018-01-01: 70 mg via INTRAVENOUS

## 2018-01-01 MED ORDER — THROMBIN (RECOMBINANT) 20000 UNITS EX SOLR
CUTANEOUS | Status: AC
  Filled 2018-01-01: qty 20000

## 2018-01-01 MED ORDER — DEXAMETHASONE SODIUM PHOSPHATE 10 MG/ML IJ SOLN
INTRAMUSCULAR | Status: DC | PRN
  Administered 2018-01-01: 5 mg via INTRAVENOUS

## 2018-01-01 MED ORDER — MIDAZOLAM HCL 5 MG/5ML IJ SOLN
INTRAMUSCULAR | Status: DC | PRN
  Administered 2018-01-01 (×2): 1 mg via INTRAVENOUS

## 2018-01-01 MED ORDER — FENTANYL CITRATE (PF) 100 MCG/2ML IJ SOLN
25.0000 ug | INTRAMUSCULAR | Status: DC | PRN
  Administered 2018-01-01: 50 ug via INTRAVENOUS

## 2018-01-01 MED ORDER — EPHEDRINE 5 MG/ML INJ
INTRAVENOUS | Status: AC
  Filled 2018-01-01: qty 10

## 2018-01-01 MED ORDER — HEPARIN SODIUM (PORCINE) 1000 UNIT/ML IJ SOLN
INTRAMUSCULAR | Status: AC
  Filled 2018-01-01: qty 1

## 2018-01-01 MED ORDER — LIDOCAINE HCL (PF) 1 % IJ SOLN
INTRAMUSCULAR | Status: DC | PRN
  Administered 2018-01-01: 30 mL

## 2018-01-01 MED ORDER — HEPARIN SODIUM (PORCINE) 1000 UNIT/ML IJ SOLN
INTRAMUSCULAR | Status: DC | PRN
  Administered 2018-01-01: 5000 [IU] via INTRAVENOUS

## 2018-01-01 MED ORDER — PROPOFOL 10 MG/ML IV BOLUS
INTRAVENOUS | Status: AC
  Filled 2018-01-01: qty 20

## 2018-01-01 MED ORDER — FENTANYL CITRATE (PF) 100 MCG/2ML IJ SOLN
INTRAMUSCULAR | Status: DC | PRN
  Administered 2018-01-01: 50 ug via INTRAVENOUS
  Administered 2018-01-01: 100 ug via INTRAVENOUS

## 2018-01-01 MED ORDER — FENTANYL CITRATE (PF) 100 MCG/2ML IJ SOLN
INTRAMUSCULAR | Status: AC
  Filled 2018-01-01: qty 2

## 2018-01-01 MED ORDER — MIDAZOLAM HCL 2 MG/2ML IJ SOLN
INTRAMUSCULAR | Status: AC
  Filled 2018-01-01: qty 2

## 2018-01-01 MED ORDER — LIDOCAINE HCL (PF) 1 % IJ SOLN
INTRAMUSCULAR | Status: AC
  Filled 2018-01-01: qty 30

## 2018-01-01 MED ORDER — ONDANSETRON HCL 4 MG/2ML IJ SOLN
INTRAMUSCULAR | Status: DC | PRN
  Administered 2018-01-01: 4 mg via INTRAVENOUS

## 2018-01-01 MED ORDER — EPHEDRINE SULFATE-NACL 50-0.9 MG/10ML-% IV SOSY
PREFILLED_SYRINGE | INTRAVENOUS | Status: DC | PRN
  Administered 2018-01-01 (×2): 10 mg via INTRAVENOUS
  Administered 2018-01-01: 5 mg via INTRAVENOUS

## 2018-01-01 MED ORDER — CHLORHEXIDINE GLUCONATE 4 % EX LIQD: 1.0000 "application " | Freq: Once | CUTANEOUS | Status: DC

## 2018-01-01 MED ORDER — SODIUM CHLORIDE 0.9 % IV SOLN
INTRAVENOUS | Status: AC
  Filled 2018-01-01: qty 1.2

## 2018-01-01 MED ORDER — SODIUM CHLORIDE 0.9 % IV SOLN
INTRAVENOUS | Status: DC
  Administered 2018-01-01: 07:00:00 via INTRAVENOUS

## 2018-01-01 MED ORDER — SODIUM CHLORIDE 0.9 % IV SOLN
INTRAVENOUS | Status: DC | PRN
  Administered 2018-01-01: 500 mL

## 2018-01-01 MED ORDER — 0.9 % SODIUM CHLORIDE (POUR BTL) OPTIME
TOPICAL | Status: DC | PRN
  Administered 2018-01-01: 1000 mL

## 2018-01-01 MED ORDER — DEXAMETHASONE SODIUM PHOSPHATE 10 MG/ML IJ SOLN
INTRAMUSCULAR | Status: AC
  Filled 2018-01-01: qty 1

## 2018-01-01 MED ORDER — ONDANSETRON HCL 4 MG/2ML IJ SOLN
INTRAMUSCULAR | Status: AC
  Filled 2018-01-01: qty 2

## 2018-01-01 SURGICAL SUPPLY — 37 items
ARMBAND PINK RESTRICT EXTREMIT (MISCELLANEOUS) ×4 IMPLANT
CANISTER SUCT 3000ML PPV (MISCELLANEOUS) ×2 IMPLANT
CANNULA VESSEL 3MM 2 BLNT TIP (CANNULA) ×2 IMPLANT
CLIP VESOCCLUDE MED 6/CT (CLIP) ×2 IMPLANT
CLIP VESOCCLUDE SM WIDE 6/CT (CLIP) ×2 IMPLANT
COVER WAND RF STERILE (DRAPES) ×2 IMPLANT
DECANTER SPIKE VIAL GLASS SM (MISCELLANEOUS) ×2 IMPLANT
DERMABOND ADVANCED (GAUZE/BANDAGES/DRESSINGS) ×1
DERMABOND ADVANCED .7 DNX12 (GAUZE/BANDAGES/DRESSINGS) ×1 IMPLANT
DRSG TEGADERM 4X4.75 (GAUZE/BANDAGES/DRESSINGS) ×2 IMPLANT
ELECT REM PT RETURN 9FT ADLT (ELECTROSURGICAL) ×2
ELECTRODE REM PT RTRN 9FT ADLT (ELECTROSURGICAL) ×1 IMPLANT
GAUZE SPONGE 2X2 8PLY STRL LF (GAUZE/BANDAGES/DRESSINGS) ×1 IMPLANT
GLOVE BIO SURGEON STRL SZ7.5 (GLOVE) ×4 IMPLANT
GLOVE BIOGEL PI IND STRL 6.5 (GLOVE) ×2 IMPLANT
GLOVE BIOGEL PI INDICATOR 6.5 (GLOVE) ×2
GLOVE ECLIPSE 6.5 STRL STRAW (GLOVE) ×2 IMPLANT
GOWN STRL REUS W/ TWL LRG LVL3 (GOWN DISPOSABLE) ×3 IMPLANT
GOWN STRL REUS W/TWL LRG LVL3 (GOWN DISPOSABLE) ×3
GRAFT GORETEX STRT 4-7X45 (Vascular Products) ×2 IMPLANT
HEMOSTAT SPONGE AVITENE ULTRA (HEMOSTASIS) IMPLANT
KIT BASIN OR (CUSTOM PROCEDURE TRAY) ×2 IMPLANT
KIT TURNOVER KIT B (KITS) ×2 IMPLANT
NS IRRIG 1000ML POUR BTL (IV SOLUTION) ×2 IMPLANT
PACK CV ACCESS (CUSTOM PROCEDURE TRAY) ×2 IMPLANT
PAD ARMBOARD 7.5X6 YLW CONV (MISCELLANEOUS) ×4 IMPLANT
SPONGE GAUZE 2X2 STER 10/PKG (GAUZE/BANDAGES/DRESSINGS) ×1
SUT PROLENE 6 0 CC (SUTURE) ×4 IMPLANT
SUT SILK 3 0 (SUTURE) ×1
SUT SILK 3-0 18XBRD TIE 12 (SUTURE) ×1 IMPLANT
SUT VIC AB 3-0 SH 27 (SUTURE) ×2
SUT VIC AB 3-0 SH 27X BRD (SUTURE) ×2 IMPLANT
SUT VICRYL 4-0 PS2 18IN ABS (SUTURE) ×4 IMPLANT
SYR TOOMEY 50ML (SYRINGE) IMPLANT
TOWEL GREEN STERILE (TOWEL DISPOSABLE) ×2 IMPLANT
UNDERPAD 30X30 (UNDERPADS AND DIAPERS) ×2 IMPLANT
WATER STERILE IRR 1000ML POUR (IV SOLUTION) ×2 IMPLANT

## 2018-01-01 NOTE — Op Note (Signed)
Procedure: Right Upper Arm AV graft  Preop: ESRD  Postop: ESRD  Anesthesia: General Findings:4-7 mm PTFE end to side to axillary vein  Asst: Gerri Lins PA-C   Procedure Details: After induction of general anesthesia and placement of a laryngeal mask, the right upper extremity was prepped and draped in usual sterile fashion. Local anesthesia was infiltrated in the antecubital crease and axilla.   A longitudinal incision was then made near the antecubital crease the right arm.  There were no suitable antecubital veins for outflow.   The incision was carried into the subcutaneous tissues down to level of the brachial artery.   Next the brachial artery was dissected free in the medial portion incision. The artery was  3 mm in diameter. The vessel loops were placed proximal and distal to the planned site of arteriotomy. At this point, a longitudinal incision was made in the axilla and carried through the subcutaneous tissues and fascia to expose the axillary vein.  The nerves were protected.  The vein was approximately 4-5 mm in diameter. Next, a subcutaneous tunnel was created connecting the upper arm to the lower arm incision in an arcing configuration over the biceps muscle.  A 4-7 mm PTFE graft was then brought through this subcutaneous tunnel. The patient was given 5000 units of intravenous heparin. After appropriate circulation time, the vessel loops were used to control the artery. A longitudinal opening was made in the right brachial artery.  The 4 mm end of the graft was beveled and sewn end to side to the artery using a 6 0 prolene.  At completion of the anastomosis the artery was forward bled, backbled and thoroughly flushed.  The anastomosis was secured, vessel loops were released and there was palpable pulse in the graft.  The graft was clamped just above the arterial anastomosis with a fistula clamp. The graft was then pulled taut to length at the axillary incision.  The axillary vein was  controlled with a fine bulldog clamp in the upper axilla proximally and distally.  The vein was opened longitudinally.  The distal end of the graft was then beveled and sewn end to side to the vein using a running 6 0 prolene.  Just prior to completion of the anastomosis, everything was forward bled, back bled and thoroughly flushed.  The anastomosis was secured and the fistula clamp removed from the proximal graft.  A thrill was immediately palpable in the graft.  After hemostasis was obtained, the subcutaneous tissues were reapproximated using a running 3-0 Vicryl suture. The skin was then closed with a 4 0 Vicryl subcuticular stitch. Dermabond was applied to the skin incisions.  The patient tolerated the procedure well and there were no complications.  Instrument sponge and needle count was correct at the end of the case.  The patient was taken to the recovery room in stable condition.   Ruta Hinds, MD [facility] [place] Office: [phone] Pager: [phone]

## 2018-01-01 NOTE — Interval H&P Note (Signed)
History and Physical Interval Note:  01/01/2018 7:22 AM  Karen [name]  has presented today for surgery, with the diagnosis of END STAGE RENAL DISEASE FOR HEMODIALYSIS ACCESS  The various methods of treatment have been discussed with the patient and family. After consideration of risks, benefits and other options for treatment, the patient has consented to  Procedure(s): INSERTION OF ARTERIOVENOUS (AV) GORE-TEX GRAFT ARM RIGHT ARM (Right) as a surgical intervention .  The patient's history has been reviewed, patient examined, no change in status, stable for surgery.  I have reviewed the patient's chart and labs.  Questions were answered to the patient's satisfaction.     Ruta Hinds

## 2018-01-01 NOTE — Anesthesia Postprocedure Evaluation (Signed)
Anesthesia Post Note  Patient: Karen [name]  Procedure(s) Performed: INSERTION OF ARTERIOVENOUS (AV) GORE-TEX GRAFT ARM RIGHT ARM (Right )     Patient location during evaluation: PACU Anesthesia Type: MAC Level of consciousness: awake Pain management: pain level controlled Vital Signs Assessment: post-procedure vital signs reviewed and stable Respiratory status: spontaneous breathing Cardiovascular status: stable Postop Assessment: adequate PO intake Anesthetic complications: no    Last Vitals:  Vitals:   01/01/18 1101 01/01/18 1115  BP: 133/71 133/71  Pulse: 81   Resp: 11   Temp:    SpO2: 100%     Last Pain:  Vitals:   01/01/18 1115  PainSc: 3                  Donnajean Chesnut

## 2018-01-01 NOTE — Transfer of Care (Signed)
Immediate Anesthesia Transfer of Care Note  Patient: Karen [name]  Procedure(s) Performed: INSERTION OF ARTERIOVENOUS (AV) GORE-TEX GRAFT ARM RIGHT ARM (Right )  Patient Location: PACU  Anesthesia Type:General  Level of Consciousness: drowsy and patient cooperative  Airway & Oxygen Therapy: Patient Spontanous Breathing and Patient connected to nasal cannula oxygen  Post-op Assessment: Report given to RN, Post -op Vital signs reviewed and stable and Patient moving all extremities  Post vital signs: Reviewed and stable  Last Vitals:  Vitals Value Taken Time  BP 140/73 01/01/2018  9:16 AM  Temp    Pulse 84 01/01/2018  9:18 AM  Resp 9 01/01/2018  9:18 AM  SpO2 100 % 01/01/2018  9:18 AM  Vitals shown include unvalidated device data.  Last Pain:  Vitals:   01/01/18 0657  PainSc: 0-No pain         Complications: No apparent anesthesia complications

## 2018-01-01 NOTE — Discharge Instructions (Signed)
° °  [facility] [place] ° °Discharge Instructions ° °AV Fistula or Graft Surgery for Dialysis Access ° °Please refer to the following instructions for your post-procedure care. Your surgeon or physician assistant will discuss any changes with you. ° °Activity ° °You may drive the day following your surgery, if you are comfortable and no longer taking prescription pain medication. Resume full activity as the soreness in your incision resolves. ° °Bathing/Showering ° °You may shower after you go home. Keep your incision dry for 48 hours. Do not soak in a bathtub, hot tub, or swim until the incision heals completely. You may not shower if you have a hemodialysis catheter. ° °Incision Care ° °Clean your incision with mild soap and water after 48 hours. Pat the area dry with a clean towel. You do not need a bandage unless otherwise instructed. Do not apply any ointments or creams to your incision. You may have skin glue on your incision. Do not peel it off. It will come off on its own in about one week. Your arm may swell a bit after surgery. To reduce swelling use pillows to elevate your arm so it is above your heart. Your doctor will tell you if you need to lightly wrap your arm with an ACE bandage. ° °Diet ° °Resume your normal diet. There are not special food restrictions following this procedure. In order to heal from your surgery, it is CRITICAL to get adequate nutrition. Your body requires vitamins, minerals, and protein. Vegetables are the best source of vitamins and minerals. Vegetables also provide the perfect balance of protein. Processed food has little nutritional value, so try to avoid this. ° °Medications ° °Resume taking all of your medications. If your incision is causing pain, you may take over-the counter pain relievers such as acetaminophen (Tylenol). If you were prescribed a stronger pain medication, please be aware these medications can cause nausea and constipation. Prevent  nausea by taking the medication with a snack or meal. Avoid constipation by drinking plenty of fluids and eating foods with high amount of fiber, such as fruits, vegetables, and grains. Do not take Tylenol if you are taking prescription pain medications. ° ° ° ° °Follow up °Your surgeon may want to see you in the office following your access surgery. If so, this will be arranged at the time of your surgery. ° °Please call us immediately for any of the following conditions: ° °Increased pain, redness, drainage (pus) from your incision site °Fever of 101 degrees or higher °Severe or worsening pain at your incision site °Hand pain or numbness. ° °Reduce your risk of vascular disease: ° °Stop smoking. If you would like help, call [facility] at 1-800-QUIT-NOW (1-800-784-8669) or [place] at 336-586-4000 ° °Manage your cholesterol °Maintain a desired weight °Control your diabetes °Keep your blood pressure down ° °Dialysis ° °It will take several weeks to several months for your new dialysis access to be ready for use. Your surgeon will determine when it is OK to use it. Your nephrologist will continue to direct your dialysis. You can continue to use your Permcath until your new access is ready for use. ° °If you have any questions, please call the office at [phone]. ° °

## 2018-01-01 NOTE — Anesthesia Preprocedure Evaluation (Signed)
Anesthesia Evaluation  Patient identified by MRN, date of birth, ID band Patient awake    Reviewed: Allergy & Precautions, NPO status , Patient's Chart, lab work & pertinent test results  Airway Mallampati: II  TM Distance: >3 FB     Dental   Pulmonary shortness of breath,    breath sounds clear to auscultation       Cardiovascular hypertension, +CHF  + dysrhythmias + Valvular Problems/Murmurs  Rhythm:Regular Rate:Normal     Neuro/Psych    GI/Hepatic GERD  ,  Endo/Other    Renal/GU Renal disease     Musculoskeletal  (+) Arthritis ,   Abdominal   Peds  Hematology  (+) anemia ,   Anesthesia Other Findings   Reproductive/Obstetrics                             Anesthesia Physical Anesthesia Plan  ASA: III  Anesthesia Plan: MAC   Post-op Pain Management:    Induction: Intravenous  PONV Risk Score and Plan: 2 and Ondansetron and Treatment may vary due to age or medical condition  Airway Management Planned: LMA  Additional Equipment:   Intra-op Plan:   Post-operative Plan:   Informed Consent: I have reviewed the patients History and Physical, chart, labs and discussed the procedure including the risks, benefits and alternatives for the proposed anesthesia with the patient or authorized representative who has indicated his/her understanding and acceptance.   Dental advisory given  Plan Discussed with: CRNA and Anesthesiologist  Anesthesia Plan Comments:         Anesthesia Quick Evaluation

## 2018-01-01 NOTE — Anesthesia Postprocedure Evaluation (Signed)
Anesthesia Post Note  Patient: Karen [name]  Procedure(s) Performed: INSERTION OF ARTERIOVENOUS (AV) GORE-TEX GRAFT ARM RIGHT ARM (Right )     Patient location during evaluation: PACU Anesthesia Type: General Level of consciousness: awake Pain management: pain level controlled Vital Signs Assessment: post-procedure vital signs reviewed and stable Respiratory status: spontaneous breathing Cardiovascular status: stable Postop Assessment: no headache Anesthetic complications: no    Last Vitals:  Vitals:   01/01/18 1101 01/01/18 1115  BP: 133/71 133/71  Pulse: 81   Resp: 11   Temp:    SpO2: 100%     Last Pain:  Vitals:   01/01/18 1115  PainSc: 3                  Thaddaeus Granja

## 2018-01-01 NOTE — Anesthesia Procedure Notes (Signed)
Procedure Name: LMA Insertion Date/Time: 01/01/2018 7:35 AM Performed by: Moshe Salisbury, CRNA Pre-anesthesia Checklist: Patient identified, Emergency Drugs available, Suction available and Patient being monitored Patient Re-evaluated:Patient Re-evaluated prior to induction Oxygen Delivery Method: Circle System Utilized Preoxygenation: Pre-oxygenation with 100% oxygen Induction Type: IV induction Ventilation: Mask ventilation without difficulty LMA: LMA inserted LMA Size: 4.0 Number of attempts: 1 Placement Confirmation: positive ETCO2 Tube secured with: Tape Dental Injury: Teeth and Oropharynx as per pre-operative assessment

## 2018-01-02 ENCOUNTER — Encounter (HOSPITAL_COMMUNITY): Payer: Self-pay | Admitting: Vascular Surgery

## 2018-01-06 ENCOUNTER — Emergency Department (HOSPITAL_COMMUNITY): Payer: Medicare Other

## 2018-01-06 ENCOUNTER — Encounter (HOSPITAL_COMMUNITY): Payer: Self-pay | Admitting: Emergency Medicine

## 2018-01-06 ENCOUNTER — Other Ambulatory Visit: Payer: Self-pay

## 2018-01-06 ENCOUNTER — Observation Stay (HOSPITAL_COMMUNITY)
Admission: EM | Admit: 2018-01-06 | Discharge: 2018-01-08 | Disposition: A | Discharge status: Home / Self Care | Payer: Medicare Other | Attending: Internal Medicine | Admitting: Internal Medicine

## 2018-01-06 DIAGNOSIS — I700 Atherosclerosis of aorta: Secondary | ICD-10-CM | POA: Diagnosis not present

## 2018-01-06 DIAGNOSIS — Z992 Dependence on renal dialysis: Secondary | ICD-10-CM | POA: Insufficient documentation

## 2018-01-06 DIAGNOSIS — E785 Hyperlipidemia, unspecified: Secondary | ICD-10-CM | POA: Insufficient documentation

## 2018-01-06 DIAGNOSIS — I132 Hypertensive heart and chronic kidney disease with heart failure and with stage 5 chronic kidney disease, or end stage renal disease: Secondary | ICD-10-CM | POA: Insufficient documentation

## 2018-01-06 DIAGNOSIS — I071 Rheumatic tricuspid insufficiency: Secondary | ICD-10-CM | POA: Diagnosis not present

## 2018-01-06 DIAGNOSIS — K219 Gastro-esophageal reflux disease without esophagitis: Secondary | ICD-10-CM | POA: Insufficient documentation

## 2018-01-06 DIAGNOSIS — Z9221 Personal history of antineoplastic chemotherapy: Secondary | ICD-10-CM | POA: Diagnosis not present

## 2018-01-06 DIAGNOSIS — Z9011 Acquired absence of right breast and nipple: Secondary | ICD-10-CM | POA: Diagnosis not present

## 2018-01-06 DIAGNOSIS — Z8249 Family history of ischemic heart disease and other diseases of the circulatory system: Secondary | ICD-10-CM | POA: Insufficient documentation

## 2018-01-06 DIAGNOSIS — Z940 Kidney transplant status: Secondary | ICD-10-CM | POA: Diagnosis not present

## 2018-01-06 DIAGNOSIS — J90 Pleural effusion, not elsewhere classified: Secondary | ICD-10-CM | POA: Insufficient documentation

## 2018-01-06 DIAGNOSIS — Z7982 Long term (current) use of aspirin: Secondary | ICD-10-CM | POA: Diagnosis not present

## 2018-01-06 DIAGNOSIS — I480 Paroxysmal atrial fibrillation: Secondary | ICD-10-CM | POA: Diagnosis not present

## 2018-01-06 DIAGNOSIS — Z6839 Body mass index (BMI) 39.0-39.9, adult: Secondary | ICD-10-CM | POA: Insufficient documentation

## 2018-01-06 DIAGNOSIS — E669 Obesity, unspecified: Secondary | ICD-10-CM | POA: Insufficient documentation

## 2018-01-06 DIAGNOSIS — I300 Acute nonspecific idiopathic pericarditis: Secondary | ICD-10-CM

## 2018-01-06 DIAGNOSIS — R079 Chest pain, unspecified: Secondary | ICD-10-CM | POA: Diagnosis not present

## 2018-01-06 DIAGNOSIS — N281 Cyst of kidney, acquired: Secondary | ICD-10-CM | POA: Insufficient documentation

## 2018-01-06 DIAGNOSIS — D649 Anemia, unspecified: Secondary | ICD-10-CM | POA: Insufficient documentation

## 2018-01-06 DIAGNOSIS — R0789 Other chest pain: Secondary | ICD-10-CM

## 2018-01-06 DIAGNOSIS — R509 Fever, unspecified: Secondary | ICD-10-CM | POA: Diagnosis not present

## 2018-01-06 DIAGNOSIS — N186 End stage renal disease: Secondary | ICD-10-CM | POA: Diagnosis not present

## 2018-01-06 DIAGNOSIS — C50411 Malignant neoplasm of upper-outer quadrant of right female breast: Secondary | ICD-10-CM | POA: Diagnosis not present

## 2018-01-06 DIAGNOSIS — K449 Diaphragmatic hernia without obstruction or gangrene: Secondary | ICD-10-CM | POA: Insufficient documentation

## 2018-01-06 DIAGNOSIS — I509 Heart failure, unspecified: Secondary | ICD-10-CM | POA: Diagnosis not present

## 2018-01-06 DIAGNOSIS — Z79899 Other long term (current) drug therapy: Secondary | ICD-10-CM | POA: Insufficient documentation

## 2018-01-06 DIAGNOSIS — K5909 Other constipation: Secondary | ICD-10-CM | POA: Insufficient documentation

## 2018-01-06 DIAGNOSIS — M1990 Unspecified osteoarthritis, unspecified site: Secondary | ICD-10-CM | POA: Insufficient documentation

## 2018-01-06 LAB — CBC WITH DIFFERENTIAL/PLATELET
ABS IMMATURE GRANULOCYTES: 0.02 10*3/uL (ref 0.00–0.07)
Basophils Absolute: 0 10*3/uL (ref 0.0–0.1)
Basophils Relative: 1 %
Eosinophils Absolute: 0.3 10*3/uL (ref 0.0–0.5)
Eosinophils Relative: 4 %
HCT: 23.5 % — ABNORMAL LOW (ref 36.0–46.0)
Hemoglobin: 7.4 g/dL — ABNORMAL LOW (ref 12.0–15.0)
IMMATURE GRANULOCYTES: 0 %
Lymphocytes Relative: 12 %
Lymphs Abs: 0.7 10*3/uL (ref 0.7–4.0)
MCH: 32.5 pg (ref 26.0–34.0)
MCHC: 31.5 g/dL (ref 30.0–36.0)
MCV: 103.1 fL — ABNORMAL HIGH (ref 80.0–100.0)
MONO ABS: 0.5 10*3/uL (ref 0.1–1.0)
MONOS PCT: 9 %
NEUTROS PCT: 74 %
Neutro Abs: 4.4 10*3/uL (ref 1.7–7.7)
Platelets: 186 10*3/uL (ref 150–400)
RBC: 2.28 MIL/uL — ABNORMAL LOW (ref 3.87–5.11)
RDW: 13.2 % (ref 11.5–15.5)
WBC: 5.9 10*3/uL (ref 4.0–10.5)
nRBC: 0 % (ref 0.0–0.2)

## 2018-01-06 LAB — COMPREHENSIVE METABOLIC PANEL
ALT: <5 U/L (ref 0–44)
AST: 18 U/L (ref 15–41)
Albumin: 3.1 g/dL — ABNORMAL LOW (ref 3.5–5.0)
Alkaline Phosphatase: 113 U/L (ref 38–126)
Anion gap: 13 (ref 5–15)
BUN: 11 mg/dL (ref 8–23)
CO2: 30 mmol/L (ref 22–32)
Calcium: 7.9 mg/dL — ABNORMAL LOW (ref 8.9–10.3)
Chloride: 98 mmol/L (ref 98–111)
Creatinine, Ser: 4.01 mg/dL — ABNORMAL HIGH (ref 0.44–1.00)
GFR calc Af Amer: 13 mL/min — ABNORMAL LOW (ref >60)
GFR calc non Af Amer: 11 mL/min — ABNORMAL LOW (ref >60)
Glucose, Bld: 125 mg/dL — ABNORMAL HIGH (ref 70–99)
Potassium: 3.5 mmol/L (ref 3.5–5.1)
Sodium: 141 mmol/L (ref 135–145)
Total Bilirubin: 0.5 mg/dL (ref 0.3–1.2)
Total Protein: 6.4 g/dL — ABNORMAL LOW (ref 6.5–8.1)

## 2018-01-06 LAB — TROPONIN I: Troponin I: <0.03 ng/mL (ref <0.03)

## 2018-01-06 LAB — D-DIMER, QUANTITATIVE: D-Dimer, Quant: 3.98 ug/mL-FEU — ABNORMAL HIGH (ref 0.00–0.50)

## 2018-01-06 MED ORDER — MORPHINE SULFATE (PF) 2 MG/ML IV SOLN: 2.0000 mg | INTRAVENOUS | Status: DC | PRN

## 2018-01-06 MED ORDER — NITROGLYCERIN 0.4 MG SL SUBL: 0.4000 mg | SUBLINGUAL_TABLET | SUBLINGUAL | Status: DC | PRN

## 2018-01-06 MED ORDER — IOPAMIDOL (ISOVUE-370) INJECTION 76%
100.0000 mL | Freq: Once | INTRAVENOUS | Status: AC | PRN
  Administered 2018-01-06: 100 mL via INTRAVENOUS

## 2018-01-06 MED ORDER — SIMVASTATIN 20 MG PO TABS
20.0000 mg | ORAL_TABLET | Freq: Every day | ORAL | Status: DC
  Filled 2018-01-06: qty 1

## 2018-01-06 MED ORDER — RENA-VITE PO TABS
1.0000 | ORAL_TABLET | Freq: Every day | ORAL | Status: DC
  Administered 2018-01-07: 1 via ORAL
  Filled 2018-01-06 (×2): qty 1

## 2018-01-06 MED ORDER — PANTOPRAZOLE SODIUM 40 MG PO TBEC
40.0000 mg | DELAYED_RELEASE_TABLET | Freq: Every day | ORAL | Status: DC
  Administered 2018-01-07 – 2018-01-08 (×2): 40 mg via ORAL
  Filled 2018-01-06 (×2): qty 1

## 2018-01-06 MED ORDER — IOPAMIDOL (ISOVUE-370) INJECTION 76%
INTRAVENOUS | Status: AC
  Filled 2018-01-06: qty 100

## 2018-01-06 MED ORDER — SEVELAMER CARBONATE 800 MG PO TABS
1600.0000 mg | ORAL_TABLET | Freq: Three times a day (TID) | ORAL | Status: DC
  Administered 2018-01-07 – 2018-01-08 (×5): 1600 mg via ORAL
  Filled 2018-01-06 (×5): qty 2

## 2018-01-06 MED ORDER — DILTIAZEM HCL 30 MG PO TABS
15.0000 mg | ORAL_TABLET | ORAL | Status: DC
  Administered 2018-01-07: 15 mg via ORAL
  Filled 2018-01-06: qty 1

## 2018-01-06 MED ORDER — HEPARIN SODIUM (PORCINE) 5000 UNIT/ML IJ SOLN: 5000.0000 [IU] | Freq: Three times a day (TID) | INTRAMUSCULAR | Status: DC

## 2018-01-06 MED ORDER — ONDANSETRON HCL 4 MG/2ML IJ SOLN: 4.0000 mg | Freq: Four times a day (QID) | INTRAMUSCULAR | Status: DC | PRN

## 2018-01-06 MED ORDER — OXYCODONE-ACETAMINOPHEN 5-325 MG PO TABS
1.0000 | ORAL_TABLET | Freq: Four times a day (QID) | ORAL | Status: DC | PRN
  Administered 2018-01-07: 1 via ORAL
  Filled 2018-01-06: qty 1

## 2018-01-06 MED ORDER — ASPIRIN EC 81 MG PO TBEC
81.0000 mg | DELAYED_RELEASE_TABLET | Freq: Every day | ORAL | Status: DC
  Administered 2018-01-07 – 2018-01-08 (×2): 81 mg via ORAL
  Filled 2018-01-06 (×2): qty 1

## 2018-01-06 MED ORDER — MORPHINE SULFATE (PF) 4 MG/ML IV SOLN: 4.0000 mg | Freq: Once | INTRAVENOUS | Status: DC

## 2018-01-06 MED ORDER — ACETAMINOPHEN 325 MG PO TABS
650.0000 mg | ORAL_TABLET | ORAL | Status: DC | PRN
  Administered 2018-01-07: 650 mg via ORAL
  Filled 2018-01-06: qty 2

## 2018-01-06 NOTE — H&P (Signed)
History and Physical    Karen [name] VQQ:[id number] DOB: 18-Jul-1955 DOA: 01/06/2018  PCP: Lucianne Lei, MD  Patient coming from: Home.  Chief Complaint: Chest pain.  HPI: Karen [name] is a 62 y.o. female with history of recently diagnosed breast cancer status post right-sided mastectomy on chemotherapy, ESRD on hemodialysis has had dialysis today, hyperlipidemia, anemia presents to the ER with complaint of chest pain.  Patient states she has had chest pain today at the end of dialysis which started off as left upper back pain then eventually started having pressure-like symptoms across the chest radiating to left arm.  Pain is mostly on deep breathing.  Denies any productive cough fever or chills.  Denies any nausea vomiting abdominal pain or diarrhea.  ED Course: In the ER patient was febrile with a EKG showing nonspecific ST-T changes with PR depression in the old leads and elevation in the aVR.  CT angiogram of the chest was negative for PE did show some seroma in the mastectomy area and pleural effusion.  Patient was given morphine following which patient chest pain resolved.  Patient is being admitted for further management of chest pain.  Review of Systems: As per HPI, rest all negative.   Past Medical History:  Diagnosis Date  . Anemia   . Arthritis    knees  . Breast cancer ([place]) 2019   Right Breast Cancer  . Cancer ([place])   . Chest pain Jan 2016   low risk Myoview   . CHF (congestive heart failure) ([place])   . Complication of anesthesia 2005   difficulty remembering for a while and waking up  . Constipation   . Dysrhythmia    h/o A-Fib  . ESRD on dialysis [facility]) April 2016   MWF  . GERD (gastroesophageal reflux disease)   . Heart murmur Nov 2015   Aortic scleosis- no stenosis  . Hemodialysis patient ([place])   . Hypertension   . Shortness of breath dyspnea    with exertion    Past Surgical History:  Procedure Laterality Date  . ABDOMINAL HYSTERECTOMY  2005  .  AV FISTULA PLACEMENT Left 12/09/2013   Procedure: INSERTION OF ARTERIOVENOUS (AV) GORE-TEX GRAFT ARM;  Surgeon: Elam Dutch, MD;  Location: [facility] OR;  Service: Vascular;  Laterality: Left;  . AV FISTULA PLACEMENT Right 01/01/2018   Procedure: INSERTION OF ARTERIOVENOUS (AV) GORE-TEX GRAFT ARM RIGHT ARM;  Surgeon: Elam Dutch, MD;  Location: [place];  Service: Vascular;  Laterality: Right;  . BREAST BIOPSY  1990's  . BUNIONECTOMY Bilateral   . CHOLECYSTECTOMY  2005  . COLONOSCOPY    . IR GENERIC HISTORICAL  01/11/2016   IR US GUIDE VASC ACCESS RIGHT 01/11/2016 Corrie Mckusick, DO [facility]-INTERV RAD  . IR GENERIC HISTORICAL  01/11/2016   IR RADIOLOGY PERIPHERAL GUIDED IV START 01/11/2016 Corrie Mckusick, DO [facility]INTER[facility]  . IR IMAGING GUIDED PORT INSERTION  07/05/2017  . 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 KNEE ARTHROSCOPY Bilateral   . MASTECTOMY W/ SENTINEL NODE BIOPSY Right 11/20/2017  . MASTECTOMY W/ SENTINEL NODE BIOPSY Right 11/20/2017   Procedure: RIGHT MASTECTOMY WITH SENTINEL LYMPH NODE BIOPSY;  Surgeon: Coralie Keens, MD;  Location: [place];  Service: General;  Laterality: Right;  . PERIPHERAL VASCULAR CATHETERIZATION N/A 08/31/2015   Procedure: A/V Shuntogram;  Surgeon: Serafina Mitchell, MD;  Location: [place] [facility];  Service: Cardiovascular;  Laterality: N/A;  . PERIPHERAL VASCULAR CATHETERIZATION Left 08/31/2015   Procedure: Peripheral  Vascular Balloon Angioplasty;  Surgeon: Serafina Mitchell, MD;  Location: [place] [facility];  Service: Cardiovascular;  Laterality: Left;  arm fistula  . ROTATOR CUFF REPAIR Right 1997  . UPPER EXTREMITY VENOGRAPHY Right 12/24/2017   Procedure: UPPER EXTREMITY VENOGRAPHY CENTRAL VENOGRAM;  Surgeon: Waynetta Sandy, MD;  Location: [place] [facility];  Service: Cardiovascular;  Laterality: Right;     reports that she has never smoked. She has never used smokeless tobacco. She reports that she does not drink alcohol or use drugs.  No  Known Allergies  Family History  Problem Relation Age of Onset  . Kidney disease Mother   . Heart attack Father   . Kidney disease Father   . Diabetes Sister   . Hyperlipidemia Sister   . Hypertension Sister   . Kidney disease Sister        x2    Prior to Admission medications   Medication Sig Start Date End Date Taking? Authorizing Provider  acetaminophen (TYLENOL) 500 MG tablet Take 1,000 mg by mouth daily as needed for mild pain, moderate pain or headache.    Yes [provider]  amoxicillin (AMOXIL) 500 MG capsule Take 2,000 mg by mouth See admin instructions. Take 4 capsules (2000 mg) by mouth one hour prior to dental procedures   Yes [provider]  aspirin EC 81 MG tablet Take 81 mg by mouth daily.   Yes [provider]  clobetasol cream (TEMOVATE) 6.60 % Apply 1 application topically 2 (two) times daily.   Yes [provider]  diltiazem (CARDIZEM) 30 MG tablet Take 15 mg by mouth every Monday, Wednesday, and Friday.    Yes [provider]  lidocaine-prilocaine (EMLA) cream Apply 1 application topically See admin instructions. Apply topically Monday, Wednesday and Friday before dialysis 05/26/15  Yes [provider]  multivitamin (RENA-VIT) TABS tablet Take 1 tablet by mouth daily.   Yes [provider]  ondansetron (ZOFRAN) 8 MG tablet Take 1 tablet (8 mg total) by mouth 2 (two) times daily as needed (Nausea or vomiting). 07/02/17  Yes Nicholas Lose, MD  oxyCODONE-acetaminophen (PERCOCET/ROXICET) 5-325 MG tablet Take 1 tablet by mouth every 6 (six) hours as needed. Patient taking differently: Take 1 tablet by mouth every 6 (six) hours as needed for moderate pain.  01/01/18  Yes Laurence Slate M, PA-C  pantoprazole (PROTONIX) 40 MG tablet TAKE 1 TABLET (40 MG TOTAL) BY MOUTH DAILY. Patient taking differently: Take 40 mg by mouth daily.  07/23/17  Yes Lorretta Harp, MD  prochlorperazine (COMPAZINE) 10 MG tablet Take 1  tablet (10 mg total) by mouth every 6 (six) hours as needed (Nausea or vomiting). 07/02/17  Yes Nicholas Lose, MD  sevelamer carbonate (RENVELA) 800 MG tablet Take 800-1,600 mg by mouth See admin instructions. 1600mg  three times daily and 800mg  twice daily with snacks   Yes [provider]  simvastatin (ZOCOR) 20 MG tablet TAKE 1 TABLET BY MOUTH EVERYDAY AT BEDTIME Patient taking differently: Take 20 mg by mouth at bedtime.  10/22/17  Yes Erlene Quan, [place]    Physical Exam: Vitals:   01/06/18 2130 01/06/18 2145 01/06/18 2300 01/06/18 2345  BP: 115/63 109/63 104/64 (!) 104/59  Pulse: (!) 110 (!) 104 100 (!) 101  Resp: 18 18 (!) 22 (!) 23  Temp:      TempSrc:      SpO2: 98% 100% 100% 100%  Weight:      Height:  Constitutional: Moderately built and nourished. Vitals:   01/06/18 2130 01/06/18 2145 01/06/18 2300 01/06/18 2345  BP: 115/63 109/63 104/64 (!) 104/59  Pulse: (!) 110 (!) 104 100 (!) 101  Resp: 18 18 (!) 22 (!) 23  Temp:      TempSrc:      SpO2: 98% 100% 100% 100%  Weight:      Height:       Eyes: Anicteric no pallor. ENMT: No discharge from the ears eyes nose or mouth. Neck: No mass or.  No neck rigidity but no JVD appreciated. Respiratory: No rhonchi or crepitations. Cardiovascular: S1-S2 heard. Abdomen: Soft nontender bowel sounds present. Musculoskeletal: No edema. Skin: No rash. Neurologic: Alert awake oriented to time place and person.  Moves all extremities. Psychiatric: Appears normal.  Normal affect.   Labs on Admission: I have personally reviewed following labs and imaging studies  CBC: Recent Labs  Lab 01/01/18 0645 01/06/18 2010  WBC  --  5.9  NEUTROABS  --  4.4  HGB 7.8* 7.4*  HCT 23.0* 23.5*  MCV  --  103.1*  PLT  --  440   Basic Metabolic Panel: Recent Labs  Lab 01/01/18 0645 01/06/18 2010  NA 141 141  K 3.9 3.5  CL  --  98  CO2  --  30  GLUCOSE 85 125*  BUN  --  11  CREATININE  --  4.01*  CALCIUM  --  7.9*     GFR: Estimated Creatinine Clearance: 14.5 mL/min (A) (by C-G formula based on SCr of 4.01 mg/dL (H)). Liver Function Tests: Recent Labs  Lab 01/06/18 2010  AST 18  ALT <5  ALKPHOS 113  BILITOT 0.5  PROT 6.4*  ALBUMIN 3.1*   No results for input(s): LIPASE, AMYLASE in the last 168 hours. No results for input(s): AMMONIA in the last 168 hours. Coagulation Profile: No results for input(s): INR, PROTIME in the last 168 hours. Cardiac Enzymes: Recent Labs  Lab 01/06/18 2010  TROPONINI <0.03   BNP (last 3 results) No results for input(s): PROBNP in the last 8760 hours. HbA1C: No results for input(s): HGBA1C in the last 72 hours. CBG: No results for input(s): GLUCAP in the last 168 hours. Lipid Profile: No results for input(s): CHOL, HDL, LDLCALC, TRIG, CHOLHDL, LDLDIRECT in the last 72 hours. Thyroid Function Tests: No results for input(s): TSH, T4TOTAL, FREET4, T3FREE, THYROIDAB in the last 72 hours. Anemia Panel: No results for input(s): VITAMINB12, FOLATE, FERRITIN, TIBC, IRON, RETICCTPCT in the last 72 hours. Urine analysis:    Component Value Date/Time   COLORURINE YELLOW 12/17/2014 1550   APPEARANCEUR TURBID (A) 12/17/2014 1550   LABSPEC 1.011 12/17/2014 1550   PHURINE 8.5 (H) 12/17/2014 1550   GLUCOSEU NEGATIVE 12/17/2014 1550   HGBUR LARGE (A) 12/17/2014 1550   BILIRUBINUR NEGATIVE 12/17/2014 1550   KETONESUR NEGATIVE 12/17/2014 1550   PROTEINUR 100 (A) 12/17/2014 1550   UROBILINOGEN 0.2 05/29/2013 1509   NITRITE NEGATIVE 12/17/2014 1550   LEUKOCYTESUR LARGE (A) 12/17/2014 1550   Sepsis Labs: @LABRCNTIP (procalcitonin:4,lacticidven:4) )No results found for this or any previous visit (from the past 240 hour(s)).   Radiological Exams on Admission: Dg Chest 2 View  Result Date: 01/06/2018 CLINICAL DATA:  Chest pain radiating to back. Atrial fibrillation. End-stage renal disease on dialysis. EXAM: CHEST - 2 VIEW COMPARISON:  10/05/2017 FINDINGS: Stable  cardiomegaly. Central venous access catheters remain in appropriate position. Both lungs are clear. No evidence pleural effusion. IMPRESSION: Stable cardiomegaly.  No active lung  disease. Electronically Signed   By: Earle Gell M.D.   On: 01/06/2018 19:49   Ct Angio Chest Pe W/cm &/or Wo Cm  Result Date: 01/06/2018 CLINICAL DATA:  Chest pain since dialysis today. History of breast cancer. EXAM: CT ANGIOGRAPHY CHEST WITH CONTRAST TECHNIQUE: Multidetector CT imaging of the chest was performed using the standard protocol during bolus administration of intravenous contrast. Multiplanar CT image reconstructions and MIPs were obtained to evaluate the vascular anatomy. CONTRAST:  170mL ISOVUE-370 IOPAMIDOL (ISOVUE-370) INJECTION 76% COMPARISON:  Chest radiograph January 06, 2018 and CT chest October 05, 2017 the FINDINGS: CARDIOVASCULAR: Adequate contrast opacification of the pulmonary artery's. Main pulmonary artery is not enlarged. No pulmonary arterial filling defects to the level of the subsegmental branches. The heart is moderately enlarged and unchanged. Minimal coronary artery calcifications. Trace pericardial effusion. Thoracic aorta is normal in course and caliber, mild calcific atherosclerosis. Pulmonary venous congestion. MEDIASTINUM/NODES: No lymphadenopathy by CT size criteria. RIGHT chest Port-A-Cath via internal jugular approach and tunneled LEFT subclavian dialysis catheter with distal tips RIGHT atrium. LEFT subclavian to axillary venous stent, patency could not be confirmed non angiographic phase. LUNGS/PLEURA: Tracheobronchial tree is patent, no pneumothorax. Mild bronchial wall thickening. Trace pleural effusions and dependent atelectasis. UPPER ABDOMEN: Non-acute. Small hiatal hernia. Numerous RIGHT renal cysts, incompletely imaged. LEFT kidney is not included in field of view. Subcentimeter probable cyst LEFT lobe of the liver. MUSCULOSKELETAL: Non-acute. Status post RIGHT mastectomy with  small volume crescentic nonenhancing fluid and surgical clips RIGHT chest wall. Midthoracic dextroscoliosis. Review of the MIP images confirms the above findings. IMPRESSION: 1. No acute pulmonary embolism. 2. Similar cardiomegaly with pulmonary vascular congestion. Trace pleural effusions and atelectasis. 3. Interval RIGHT mastectomy. Small seroma without image findings of abscess. Aortic Atherosclerosis (ICD10-I70.0). Electronically Signed   By: Elon Alas M.D.   On: 01/06/2018 22:54    EKG: Independently reviewed.  Normal sinus rhythm with nonspecific ST-T changes features concerning for pericarditis.  Assessment/Plan Principal Problem:   Chest pain Active Problems:   End stage renal disease on dialysis [facility])   Paroxysmal atrial fibrillation (HCC)   S/P mastectomy, right    1. Chest pain appears to be pleuritic but given history of chemotherapy we will cycle cardiac markers to rule out ACS.  Check sed rate and CRP for possible pericarditis.  Check 2D echo.  Consult cardiology.  Keeping patient n.p.o. in anticipation of possible procedure. 2. Fever -source not clear could be from pericarditis if diagnosed.  Patient had a recent vascular procedure for AV fistula and also had right mastectomy with CAT scan showing seroma.  I have not started patient on antibiotics and ordered blood cultures sed rate CRP and influenza PCR. 3. ESRD on hemodialysis has had dialysis yesterday.  Please consult nephrology for dialysis. 4. Anemia secondary to dialysis likely could also be from chemotherapy.  Follow CBC hemoglobin appears to be at baseline. 5. Breast cancer status post mastectomy on chemotherapy.  Followed by Dr. Lindi Adie. 6. Hyperlipidemia on statins.   DVT prophylaxis: SCDs for now until we rule out pericarditis. Code Status: Full code. Family Communication: Patient's son at the bedside. Disposition Plan: Home. Consults called: Cardiology. Admission status: Observation.   Rise Patience MD [facility] Pager [phone].  If 7PM-7AM, please contact night-coverage [email or web address] Password [facility]  01/06/2018, 11:49 PM

## 2018-01-06 NOTE — ED Notes (Signed)
Power port to R chest accessed on first attempt using sterile technique. Pt tolerated well. Immediate blood return.  Samples obtained for lab testing.

## 2018-01-06 NOTE — ED Triage Notes (Signed)
Per [facility]- Pt picked up from dialysis due to chest pain x1 day that radiates to back.  Hx of a-fib and breast cancer

## 2018-01-06 NOTE — ED Notes (Signed)
Pt resting with eyes closed, RR even and unlabored, NAD, pt reports no pain at this time.

## 2018-01-06 NOTE — ED Notes (Signed)
Pt returned from CT °

## 2018-01-06 NOTE — ED Provider Notes (Signed)
[facility] EMERGENCY DEPARTMENT Provider Note   CSN: [id number] Arrival date & time: 01/06/18  1853     History   Chief Complaint Chief Complaint  Patient presents with  . Chest Pain    HPI Karen [name] is a 62 y.o. female.  HPI Patient presents concern of chest pain. Onset was sudden, about 2 hours prior to ED arrival, after the patient completed a course of dialysis. Patient has multiple medical issues including end-stage renal disease, and ongoing therapy for breast cancer. She is receiving chemotherapy, every 3 weeks, last injection was 20 days ago. No particular dyspnea, no vomiting, no diarrhea. No cutaneous changes. Since onset the pain is been persistent, with no clear alleviating or exacerbating factors. Arrives via EMS. Paramedics note that the patient was hypoxic, saturation in the 80% range on room air. Past Medical History:  Diagnosis Date  . Anemia   . Arthritis    knees  . Breast cancer ([place]) 2019   Right Breast Cancer  . Cancer ([place])   . Chest pain Jan 2016   low risk Myoview   . CHF (congestive heart failure) ([place])   . Complication of anesthesia 2005   difficulty remembering for a while and waking up  . Constipation   . Dysrhythmia    h/o A-Fib  . ESRD on dialysis [place]) April 2016   MWF  . GERD (gastroesophageal reflux disease)   . Heart murmur Nov 2015   Aortic scleosis- no stenosis  . Hemodialysis patient ([place])   . Hypertension   . Shortness of breath dyspnea    with exertion    Patient Active Problem List   Diagnosis Date Noted  . S/P mastectomy, right 11/20/2017  . Port-A-Cath in place 07/17/2017  . Malignant neoplasm of upper-outer quadrant of right breast in female, estrogen receptor negative ([place]) 07/02/2017  . Cough 03/06/2017  . Elevated troponin I level 06/07/2015  . Paroxysmal atrial fibrillation ([place]) 06/07/2015  . Klebsiella pneumoniae sepsis ([place]) 12/22/2014  . Sinus tachycardia 12/21/2014  .  Dyspnea 12/21/2014  . Infection of urinary tract 12/20/2014  . Sepsis ([place]) 12/19/2014  . Fever, unspecified 12/17/2014  . Thrush 12/17/2014  . Erythema induratum 12/17/2014  . Obesity, Class II, BMI 35.0-39.9, with comorbidity (see actual BMI) 11/19/2014  . Bilateral lower extremity edema 04/28/2014  . Chronic constipation 05/26/2013  . Dyspepsia 08/08/2012  . Chronic nausea 08/08/2012  . History of chest pain 06/12/2012  . End stage renal disease on dialysis [facility]) 07/04/2011    Past Surgical History:  Procedure Laterality Date  . ABDOMINAL HYSTERECTOMY  2005  . AV FISTULA PLACEMENT Left 12/09/2013   Procedure: INSERTION OF ARTERIOVENOUS (AV) GORE-TEX GRAFT ARM;  Surgeon: Elam Dutch, MD;  Location: [facility] OR;  Service: Vascular;  Laterality: Left;  . AV FISTULA PLACEMENT Right 01/01/2018   Procedure: INSERTION OF ARTERIOVENOUS (AV) GORE-TEX GRAFT ARM RIGHT ARM;  Surgeon: Elam Dutch, MD;  Location: [facility];  Service: Vascular;  Laterality: Right;  . BREAST BIOPSY  1990's  . BUNIONECTOMY Bilateral   . CHOLECYSTECTOMY  2005  . COLONOSCOPY    . IR GENERIC HISTORICAL  01/11/2016   IR US GUIDE VASC ACCESS RIGHT 01/11/2016 Corrie Mckusick, DO [facility]-INTERV RAD  . IR GENERIC HISTORICAL  01/11/2016   IR RADIOLOGY PERIPHERAL GUIDED IV START 01/11/2016 Corrie Mckusick, DO [facility]INTER[facility]  . IR IMAGING GUIDED PORT INSERTION  07/05/2017  . 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 KNEE ARTHROSCOPY  Bilateral   . MASTECTOMY W/ SENTINEL NODE BIOPSY Right 11/20/2017  . MASTECTOMY W/ SENTINEL NODE BIOPSY Right 11/20/2017   Procedure: RIGHT MASTECTOMY WITH SENTINEL LYMPH NODE BIOPSY;  Surgeon: Coralie Keens, MD;  [facility]: [facility];  Service: General;  Laterality: Right;  . PERIPHERAL VASCULAR CATHETERIZATION N/A 08/31/2015   Procedure: A/V Shuntogram;  Surgeon: Serafina Mitchell, MD;  Location: [facility] CV LAB;  Service: Cardiovascular;  Laterality: N/A;  . PERIPHERAL VASCULAR CATHETERIZATION Left  08/31/2015   Procedure: Peripheral Vascular Balloon Angioplasty;  Surgeon: Serafina Mitchell, MD;  Location: [facility] CV LAB;  Service: Cardiovascular;  Laterality: Left;  arm fistula  . ROTATOR CUFF REPAIR Right 1997  . UPPER EXTREMITY VENOGRAPHY Right 12/24/2017   Procedure: UPPER EXTREMITY VENOGRAPHY CENTRAL VENOGRAM;  Surgeon: Waynetta Sandy, MD;  Location: [facility] CV LAB;  Service: Cardiovascular;  Laterality: Right;     OB History   No obstetric history on file.      Home Medications    Prior to Admission medications   Medication Sig Start Date End Date Taking? Authorizing Provider  acetaminophen (TYLENOL) 500 MG tablet Take 1,000 mg by mouth daily as needed for mild pain, moderate pain or headache.     [provider]  amoxicillin (AMOXIL) 500 MG capsule Take 2,000 mg by mouth See admin instructions. Take 4 capsules (2000 mg) by mouth one hour prior to dental procedures    [provider]  aspirin EC 81 MG tablet Take 81 mg by mouth daily.    [provider]  clobetasol cream (TEMOVATE) 3.53 % Apply 1 application topically 2 (two) times daily.    [provider]  diltiazem (CARDIZEM) 30 MG tablet Take 15 mg by mouth every Monday, Wednesday, and Friday.     [provider]  lidocaine-prilocaine (EMLA) cream Apply 1 application topically See admin instructions. Apply topically Monday, Wednesday and Friday before dialysis 05/26/15   [provider]  multivitamin (RENA-VIT) TABS tablet Take 1 tablet by mouth daily.    [provider]  ondansetron (ZOFRAN) 8 MG tablet Take 1 tablet (8 mg total) by mouth 2 (two) times daily as needed (Nausea or vomiting). 07/02/17   Nicholas Lose, MD  oxyCODONE-acetaminophen (PERCOCET/ROXICET) 5-325 MG tablet Take 1 tablet by mouth every 6 (six) hours as needed. 01/01/18   Ulyses Amor, PA-C  pantoprazole (PROTONIX) 40 MG tablet TAKE 1 TABLET (40 MG TOTAL) BY MOUTH  DAILY. Patient taking differently: Take 40 mg by mouth daily.  07/23/17   Lorretta Harp, MD  prochlorperazine (COMPAZINE) 10 MG tablet Take 1 tablet (10 mg total) by mouth every 6 (six) hours as needed (Nausea or vomiting). 07/02/17   Nicholas Lose, MD  sevelamer carbonate (RENVELA) 800 MG tablet Take 800-1,600 mg by mouth See admin instructions. 1600mg  three times daily and 800mg  twice daily with snacks    [provider]  simvastatin (ZOCOR) 20 MG tablet TAKE 1 TABLET BY MOUTH EVERYDAY AT BEDTIME Patient taking differently: Take 20 mg by mouth at bedtime.  10/22/17   Erlene Quan, PA-C    Family History Family History  Problem Relation Age of Onset  . Kidney disease Mother   . Heart attack Father   . Kidney disease Father   . Diabetes Sister   . Hyperlipidemia Sister   . Hypertension Sister   . Kidney disease Sister        x2    Social History Social History   Tobacco  Use  . Smoking status: Never Smoker  . Smokeless tobacco: Never Used  Substance Use Topics  . Alcohol use: No  . Drug use: No     Allergies   Patient has no known allergies.   Review of Systems Review of Systems  Constitutional:       Per HPI, otherwise negative  HENT:       Per HPI, otherwise negative  Respiratory:       Per HPI, otherwise negative  Cardiovascular:       Per HPI, otherwise negative  Gastrointestinal: Negative for vomiting.  Endocrine:       Negative aside from HPI  Genitourinary:       Neg aside from HPI   Musculoskeletal:       Per HPI, otherwise negative  Skin: Negative.   Allergic/Immunologic: Positive for immunocompromised state.  Neurological: Negative for syncope.     Physical Exam Updated Vital Signs BP 104/64   Pulse 100   Temp (!) 100.9 F (38.3 C) (Oral)   Resp (!) 22   Ht 5\' 1"  (1.549 m)   Wt 86.2 kg   SpO2 100%   BMI 35.90 kg/m   Physical Exam Vitals signs and nursing note reviewed.  Constitutional:      General: She is not in acute  distress.    Appearance: She is well-developed.     Comments: Uncomfortable appearing elderly appearing female awake and alert  HENT:     Head: Normocephalic and atraumatic.  Eyes:     Conjunctiva/sclera: Conjunctivae normal.  Cardiovascular:     Rate and Rhythm: Regular rhythm. Tachycardia present.  Pulmonary:     Effort: Pulmonary effort is normal. No respiratory distress.     Breath sounds: Normal breath sounds. No stridor.  Chest:     Comments: Right upper chest wall chemotherapy port, left upper chest wall hemodialysis port Abdominal:     General: There is no distension.  Skin:    General: Skin is warm and dry.  Neurological:     Mental Status: She is alert and oriented to person, place, and time.     Cranial Nerves: No cranial nerve deficit.      ED Treatments / Results  Labs (all labs ordered are listed, but only abnormal results are displayed) Labs Reviewed  COMPREHENSIVE METABOLIC PANEL - Abnormal; Notable for the following components:      Result Value   Glucose, Bld 125 (*)    Creatinine, Ser 4.01 (*)    Calcium 7.9 (*)    Total Protein 6.4 (*)    Albumin 3.1 (*)    GFR calc non Af Amer 11 (*)    GFR calc Af Amer 13 (*)    All other components within normal limits  CBC WITH DIFFERENTIAL/PLATELET - Abnormal; Notable for the following components:   RBC 2.28 (*)    Hemoglobin 7.4 (*)    HCT 23.5 (*)    MCV 103.1 (*)    All other components within normal limits  D-DIMER, QUANTITATIVE (NOT AT [facility]) - Abnormal; Notable for the following components:   D-Dimer, Quant 3.98 (*)    All other components within normal limits  TROPONIN I    EKG EKG Interpretation  Date/Time:  Sunday January 06 2018 19:03:12 EST Ventricular Rate:  106 PR Interval:    QRS Duration: 89 QT Interval:  350 QTC Calculation: 465 R Axis:   12 Text Interpretation:  Sinus tachycardia ST-t wave abnormality Artifact Abnormal ekg Confirmed by  Carmin Muskrat ([id number]) on 01/06/2018 7:09:07  PM   Radiology Dg Chest 2 View  Result Date: 01/06/2018 CLINICAL DATA:  Chest pain radiating to back. Atrial fibrillation. End-stage renal disease on dialysis. EXAM: CHEST - 2 VIEW COMPARISON:  10/05/2017 FINDINGS: Stable cardiomegaly. Central venous access catheters remain in appropriate position. Both lungs are clear. No evidence pleural effusion. IMPRESSION: Stable cardiomegaly.  No active lung disease. Electronically Signed   By: Earle Gell M.D.   On: 01/06/2018 19:49   Ct Angio Chest Pe W/cm &/or Wo Cm  Result Date: 01/06/2018 CLINICAL DATA:  Chest pain since dialysis today. History of breast cancer. EXAM: CT ANGIOGRAPHY CHEST WITH CONTRAST TECHNIQUE: Multidetector CT imaging of the chest was performed using the standard protocol during bolus administration of intravenous contrast. Multiplanar CT image reconstructions and MIPs were obtained to evaluate the vascular anatomy. CONTRAST:  153mL ISOVUE-370 IOPAMIDOL (ISOVUE-370) INJECTION 76% COMPARISON:  Chest radiograph January 06, 2018 and CT chest October 05, 2017 the FINDINGS: CARDIOVASCULAR: Adequate contrast opacification of the pulmonary artery's. Main pulmonary artery is not enlarged. No pulmonary arterial filling defects to the level of the subsegmental branches. The heart is moderately enlarged and unchanged. Minimal coronary artery calcifications. Trace pericardial effusion. Thoracic aorta is normal in course and caliber, mild calcific atherosclerosis. Pulmonary venous congestion. MEDIASTINUM/NODES: No lymphadenopathy by CT size criteria. RIGHT chest Port-A-Cath via internal jugular approach and tunneled LEFT subclavian dialysis catheter with distal tips RIGHT atrium. LEFT subclavian to axillary venous stent, patency could not be confirmed non angiographic phase. LUNGS/PLEURA: Tracheobronchial tree is patent, no pneumothorax. Mild bronchial wall thickening. Trace pleural effusions and dependent atelectasis. UPPER ABDOMEN: Non-acute. Small  hiatal hernia. Numerous RIGHT renal cysts, incompletely imaged. LEFT kidney is not included in field of view. Subcentimeter probable cyst LEFT lobe of the liver. MUSCULOSKELETAL: Non-acute. Status post RIGHT mastectomy with small volume crescentic nonenhancing fluid and surgical clips RIGHT chest wall. Midthoracic dextroscoliosis. Review of the MIP images confirms the above findings. IMPRESSION: 1. No acute pulmonary embolism. 2. Similar cardiomegaly with pulmonary vascular congestion. Trace pleural effusions and atelectasis. 3. Interval RIGHT mastectomy. Small seroma without image findings of abscess. Aortic Atherosclerosis (ICD10-I70.0). Electronically Signed   By: Elon Alas M.D.   On: 01/06/2018 22:54    Procedures Procedures (including critical care time)  Medications Ordered in ED Medications  iopamidol (ISOVUE-370) 76 % injection (has no administration in time range)  morphine 4 MG/ML injection 4 mg (has no administration in time range)  iopamidol (ISOVUE-370) 76 % injection 100 mL (100 mLs Intravenous Contrast Given 01/06/18 2207)     Initial Impression / Assessment and Plan / ED Course  I have reviewed the triage vital signs and the nursing notes.  Pertinent labs & imaging results that were available during my care of the patient were reviewed by me and considered in my medical decision making (see chart for details).    Update: Patient had a recurrence of pain.  She remains tachycardic, though awake, alert, oriented.  Update:, Initial labs notable for elevated d-dimer, unremarkable troponin.  11:20 PM CT scan reviewed with the patient and her son. Pain is currently gone, patient has received fluids, morphine, aspirin. Patient had slight fever, slight tachycardia, but no evident source of infection nor any leukocytosis. Initial studies reassuring, but in this 62 year old female with end-stage renal disease, active cancer now presenting with chest pain, given her risk  profile, though initial studies were reassuring, she requires admission for further evaluation and management.  Final Clinical  Impressions(s) / ED Diagnoses  Atypical chest pain   Carmin Muskrat, MD 01/06/18 2322

## 2018-01-06 NOTE — ED Notes (Signed)
Pt c/o returning chest pain, began as dull pressure progressed to sharp pain,worse with breathing. Pt placed on 02 for comfort, EKG captured and given to Dr. Vanita Panda

## 2018-01-07 ENCOUNTER — Observation Stay (HOSPITAL_BASED_OUTPATIENT_CLINIC_OR_DEPARTMENT_OTHER): Payer: Medicare Other

## 2018-01-07 ENCOUNTER — Other Ambulatory Visit: Payer: Self-pay

## 2018-01-07 DIAGNOSIS — I301 Infective pericarditis: Secondary | ICD-10-CM

## 2018-01-07 DIAGNOSIS — R509 Fever, unspecified: Secondary | ICD-10-CM | POA: Diagnosis not present

## 2018-01-07 DIAGNOSIS — I361 Nonrheumatic tricuspid (valve) insufficiency: Secondary | ICD-10-CM | POA: Diagnosis not present

## 2018-01-07 DIAGNOSIS — I480 Paroxysmal atrial fibrillation: Secondary | ICD-10-CM | POA: Diagnosis not present

## 2018-01-07 DIAGNOSIS — R0789 Other chest pain: Secondary | ICD-10-CM | POA: Diagnosis not present

## 2018-01-07 DIAGNOSIS — N186 End stage renal disease: Secondary | ICD-10-CM | POA: Diagnosis not present

## 2018-01-07 DIAGNOSIS — R079 Chest pain, unspecified: Secondary | ICD-10-CM

## 2018-01-07 LAB — CBC
HCT: 22.5 % — ABNORMAL LOW (ref 36.0–46.0)
HCT: 25.5 % — ABNORMAL LOW (ref 36.0–46.0)
HEMOGLOBIN: 7 g/dL — AB (ref 12.0–15.0)
Hemoglobin: 8 g/dL — ABNORMAL LOW (ref 12.0–15.0)
MCH: 32.3 pg (ref 26.0–34.0)
MCH: 30.9 pg (ref 26.0–34.0)
MCHC: 31.1 g/dL (ref 30.0–36.0)
MCHC: 31.4 g/dL (ref 30.0–36.0)
MCV: 103.7 fL — ABNORMAL HIGH (ref 80.0–100.0)
MCV: 98.5 fL (ref 80.0–100.0)
Platelets: 179 10*3/uL (ref 150–400)
Platelets: 195 10*3/uL (ref 150–400)
RBC: 2.17 MIL/uL — ABNORMAL LOW (ref 3.87–5.11)
RBC: 2.59 MIL/uL — ABNORMAL LOW (ref 3.87–5.11)
RDW: 13.2 % (ref 11.5–15.5)
RDW: 19 % — ABNORMAL HIGH (ref 11.5–15.5)
WBC: 6.1 10*3/uL (ref 4.0–10.5)
WBC: 4.9 10*3/uL (ref 4.0–10.5)
nRBC: 0 % (ref 0.0–0.2)
nRBC: 0 % (ref 0.0–0.2)

## 2018-01-07 LAB — ECHOCARDIOGRAM COMPLETE
Height: 61 in
Weight: 3039.88 oz

## 2018-01-07 LAB — MRSA PCR SCREENING: MRSA by PCR: NEGATIVE

## 2018-01-07 LAB — ABO/RH: ABO/RH(D): B POS

## 2018-01-07 LAB — CREATININE, SERUM
Creatinine, Ser: 4.99 mg/dL — ABNORMAL HIGH (ref 0.44–1.00)
GFR calc Af Amer: 10 mL/min — ABNORMAL LOW (ref >60)
GFR calc non Af Amer: 9 mL/min — ABNORMAL LOW (ref >60)

## 2018-01-07 LAB — INFLUENZA PANEL BY PCR (TYPE A & B)
Influenza A By PCR: NEGATIVE
Influenza B By PCR: NEGATIVE

## 2018-01-07 LAB — TROPONIN I: Troponin I: <0.03 ng/mL (ref <0.03)

## 2018-01-07 LAB — SEDIMENTATION RATE: Sed Rate: >140 mm/hr — ABNORMAL HIGH (ref 0–22)

## 2018-01-07 LAB — HIV ANTIBODY (ROUTINE TESTING W REFLEX): HIV Screen 4th Generation wRfx: NONREACTIVE

## 2018-01-07 LAB — C-REACTIVE PROTEIN: CRP: 7.3 mg/dL — ABNORMAL HIGH (ref <1.0)

## 2018-01-07 LAB — PREPARE RBC (CROSSMATCH)

## 2018-01-07 MED ORDER — ATORVASTATIN CALCIUM 10 MG PO TABS
10.0000 mg | ORAL_TABLET | Freq: Every day | ORAL | Status: DC
  Administered 2018-01-07: 10 mg via ORAL
  Filled 2018-01-07: qty 1

## 2018-01-07 MED ORDER — SODIUM CHLORIDE 0.9% IV SOLUTION: Freq: Once | INTRAVENOUS | Status: DC

## 2018-01-07 MED ORDER — IBUPROFEN 600 MG PO TABS
600.0000 mg | ORAL_TABLET | Freq: Three times a day (TID) | ORAL | Status: DC
  Administered 2018-01-07 – 2018-01-08 (×4): 600 mg via ORAL
  Filled 2018-01-07 (×4): qty 1

## 2018-01-07 MED ORDER — COLCHICINE 0.6 MG PO TABS
0.6000 mg | ORAL_TABLET | Freq: Every day | ORAL | Status: DC
  Administered 2018-01-07 – 2018-01-08 (×2): 0.6 mg via ORAL
  Filled 2018-01-07 (×3): qty 1

## 2018-01-07 MED ORDER — SODIUM CHLORIDE 0.9% FLUSH
10.0000 mL | INTRAVENOUS | Status: DC | PRN
  Administered 2018-01-07 – 2018-01-08 (×2): 10 mL
  Administered 2018-01-08: 20 mL
  Filled 2018-01-07 (×3): qty 40

## 2018-01-07 MED ORDER — SEVELAMER CARBONATE 800 MG PO TABS: 800.0000 mg | ORAL_TABLET | ORAL | Status: DC | PRN

## 2018-01-07 MED ORDER — ACETAMINOPHEN 325 MG PO TABS
650.0000 mg | ORAL_TABLET | Freq: Once | ORAL | Status: AC
  Administered 2018-01-07: 650 mg via ORAL
  Filled 2018-01-07: qty 2

## 2018-01-07 MED ORDER — DIPHENHYDRAMINE HCL 50 MG/ML IJ SOLN
25.0000 mg | Freq: Once | INTRAMUSCULAR | Status: AC
  Administered 2018-01-07: 25 mg via INTRAVENOUS
  Filled 2018-01-07: qty 1

## 2018-01-07 NOTE — Consult Note (Addendum)
Cardiology Consultation:   Patient ID: Karen [name] MRN: [id number]; DOB: [birth year]  Admit date: 01/06/2018 Date of Consult: 01/07/2018  Primary Care Provider: Lucianne Lei, MD Primary Cardiologist: Quay Burow, MD  EP: Dr. Rayann Heman (afib clinic)  Patient Profile:   Karen [name] is a 61 y.o. female with a hx of  HTN, ESRD on HD, breast cancer s/p R mastectomy (now on Herceptin q 3 wks x 1 year) and chronic anemia who is being seen today for the evaluation of Chest pain at the request of Dr. Sloan Leiter.   Dobutamine stress echo at [facility] Feb 2017 was negative and a Lexiscan May 2017 was negative. Coronary CTA done Dec 2017 showed minor CAD.   He follows in A. fib clinic for atrial fibrillation.  Previously offered ablation by Dr. Rayann Heman however patient declined.  Not on anticoagulation for CHADSVASC score of 1 (female).   History of Present Illness:   Karen [name] started to having left upper sternal chest pressure during last 15 minutes of dialysis yesterday.  She was hypotensive.  Eventually subsided and went home however return.  Her pain radiated to her left shoulder with associated shortness of breath.  She was brought to ER for further evaluation.  She never received any sublingual nitroglycerin since admit.  She had a recurrent chest pain this morning which resolved with the pain medication.  She states that her pain is worse with deep breath and laying down.  She says her symptoms are not similar to her A. fib episodes.  She was febrile at 100.9 on admission.  D-dimer elevated at 3.98.  Sed rate greater than 140.  CRP 7.3.  Troponin negative x4.  Anemic at hemoglobin of 7.  Plan to transfuse blood.  CT angiogram of chest without pulmonary embolism.  Showed pulmonary vascular congestion with trace pleural effusion.  Small seroma without abscess.  Past Medical History:  Diagnosis Date  . Anemia   . Arthritis    knees  . Breast cancer ([place]) 2019   Right Breast Cancer  .  Cancer ([place])   . Chest pain Jan 2016   low risk Myoview   . CHF (congestive heart failure) ([place])   . Complication of anesthesia 2005   difficulty remembering for a while and waking up  . Constipation   . Dysrhythmia    h/o A-Fib  . ESRD on dialysis [facility]) April 2016   MWF  . GERD (gastroesophageal reflux disease)   . Heart murmur Nov 2015   Aortic scleosis- no stenosis  . Hemodialysis patient ([place])   . Hypertension   . Shortness of breath dyspnea    with exertion    Past Surgical History:  Procedure Laterality Date  . ABDOMINAL HYSTERECTOMY  2005  . AV FISTULA PLACEMENT Left 12/09/2013   Procedure: INSERTION OF ARTERIOVENOUS (AV) GORE-TEX GRAFT ARM;  Surgeon: Elam Dutch, MD;  Location: [facility] OR;  Service: Vascular;  Laterality: Left;  . AV FISTULA PLACEMENT Right 01/01/2018   Procedure: INSERTION OF ARTERIOVENOUS (AV) GORE-TEX GRAFT ARM RIGHT ARM;  Surgeon: Elam Dutch, MD;  Location: [place];  Service: Vascular;  Laterality: Right;  . BREAST BIOPSY  1990's  . BUNIONECTOMY Bilateral   . CHOLECYSTECTOMY  2005  . COLONOSCOPY    . IR GENERIC HISTORICAL  01/11/2016   IR US GUIDE VASC ACCESS RIGHT 01/11/2016 Corrie Mckusick, DO [facility]-INTER[facility] RAD  . IR GENERIC HISTORICAL  01/11/2016   IR RADIOLOGY PERIPHERAL GUIDED IV START 01/11/2016 Corrie Mckusick, DO [facility]INTER[facility]  .  IR IMAGING GUIDED PORT INSERTION  07/05/2017  . 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 KNEE ARTHROSCOPY Bilateral   . MASTECTOMY W/ SENTINEL NODE BIOPSY Right 11/20/2017  . MASTECTOMY W/ SENTINEL NODE BIOPSY Right 11/20/2017   Procedure: RIGHT MASTECTOMY WITH SENTINEL LYMPH NODE BIOPSY;  Surgeon: Coralie Keens, MD;  Location: [place];  Service: General;  Laterality: Right;  . PERIPHERAL VASCULAR CATHETERIZATION N/A 08/31/2015   Procedure: A/V Shuntogram;  Surgeon: Serafina Mitchell, MD;  Location: [place] CV LAB;  Service: Cardiovascular;  Laterality: N/A;  . PERIPHERAL VASCULAR CATHETERIZATION Left 08/31/2015    Procedure: Peripheral Vascular Balloon Angioplasty;  Surgeon: Serafina Mitchell, MD;  Location: [place] CV LAB;  Service: Cardiovascular;  Laterality: Left;  arm fistula  . ROTATOR CUFF REPAIR Right 1997  . UPPER EXTREMITY VENOGRAPHY Right 12/24/2017   Procedure: UPPER EXTREMITY VENOGRAPHY CENTRAL VENOGRAM;  Surgeon: Waynetta Sandy, MD;  Location: [place] CV LAB;  Service: Cardiovascular;  Laterality: Right;     Inpatient Medications: Scheduled Meds: . sodium chloride   Intravenous Once  . acetaminophen  650 mg Oral Once  . aspirin EC  81 mg Oral Daily  . atorvastatin  10 mg Oral QHS  . diltiazem  15 mg Oral Q M,W,F  . diphenhydrAMINE  25 mg Intravenous Once  . multivitamin  1 tablet Oral QHS  . pantoprazole  40 mg Oral Daily  . sevelamer carbonate  1,600 mg Oral TID WC   Continuous Infusions:  PRN Meds: acetaminophen, morphine injection, nitroGLYCERIN, ondansetron (ZOFRAN) IV, oxyCODONE-acetaminophen, sevelamer carbonate, sodium chloride flush  Allergies:   No Known Allergies  Social History:   Social History   Socioeconomic History  . Marital status: Married    Spouse name: Not on file  . Number of children: 2  . Years of education: Not on file  . Highest education level: Not on file  Occupational History  . Occupation: [employer] OFFICE    Employer: [employer]  Social Needs  . Financial resource strain: Not on file  . Food insecurity:    Worry: Not on file    Inability: Not on file  . Transportation needs:    Medical: Not on file    Non-medical: Not on file  Tobacco Use  . Smoking status: Never Smoker  . Smokeless tobacco: Never Used  Substance and Sexual Activity  . Alcohol use: No  . Drug use: No  . Sexual activity: Not on file    Comment: Hysterectomy  Lifestyle  . Physical activity:    Days per week: Not on file    Minutes per session: Not on file  . Stress: Not on file  Relationships  . Social connections:    Talks on phone: Not  on file    Gets together: Not on file    Attends religious service: Not on file    Active member of club or organization: Not on file    Attends meetings of clubs or organizations: Not on file    Relationship status: Not on file  . Intimate partner violence:    Fear of current or ex partner: Not on file    Emotionally abused: Not on file    Physically abused: Not on file    Forced sexual activity: Not on file  Other Topics Concern  . Not on file  Social History Narrative  . Not on file    Family History:   Family History  Problem Relation Age of  Onset  . Kidney disease Mother   . Heart attack Father   . Kidney disease Father   . Diabetes Sister   . Hyperlipidemia Sister   . Hypertension Sister   . Kidney disease Sister        x2     ROS:  Please see the history of present illness.  All other ROS reviewed and negative.     Physical Exam/Data:   Vitals:   01/07/18 0129 01/07/18 0513 01/07/18 0957 01/07/18 1301  BP: 129/72 92/60 (!) 79/47 (!) 94/57  Pulse: (!) 108  87 70  Resp: 18  18 18   Temp: (!) 100.6 F (38.1 C)  99.7 F (37.6 C) 98 F (36.7 C)  TempSrc: Oral  Oral Oral  SpO2: 100%  100% 100%  Weight:      Height:        Intake/Output Summary (Last 24 hours) at 01/07/2018 1358 Last data filed at 01/07/2018 1200 Gross per 24 hour  Intake 310 ml  Output 0 ml  Net 310 ml   Filed Weights   01/06/18 1901  Weight: 86.2 kg   Body mass index is 35.9 kg/m.  General:  Well nourished, well developed, in no acute distress HEENT: normal Lymph: no adenopathy Neck: no JVD Endocrine:  No thryomegaly Vascular: No carotid bruits; FA pulses 2+ bilaterally without bruits  Cardiac:  normal S1, S2; RRR; pericardial rub Lungs:  clear to auscultation bilaterally, no wheezing, rhonchi or rales  Abd: soft, nontender, no hepatomegaly  Ext: no edema Musculoskeletal:  No deformities, BUE and BLE strength normal and equal Skin: warm and dry  Neuro:  CNs 2-12 intact, no  focal abnormalities noted Psych:  Normal affect   EKG:  The EKG was personally reviewed and demonstrates: Sinus rhythm with repolarization abnormality, PR depression Telemetry:  Telemetry was personally reviewed and demonstrates: Sinus rhythm at 80s and brief episode of tachycardia  Relevant CV Studies:  Echo 07/13/17 Study Conclusions  - Left ventricle: The cavity size was normal. Systolic function was   normal. The estimated ejection fraction was in the range of 60%   to 65%. Wall motion was normal; there were no regional wall   motion abnormalities. Doppler parameters are consistent with   abnormal left ventricular relaxation (grade 1 diastolic   dysfunction). Doppler parameters are consistent with   indeterminate ventricular filling pressure. - Aortic valve: There was no regurgitation. - Mitral valve: Transvalvular velocity was within the normal range.   There was no evidence for stenosis. There was no regurgitation. - Left atrium: The atrium was mildly dilated. - Right ventricle: The cavity size was normal. Wall thickness was   normal. Systolic function was normal. - Tricuspid valve: There was mild regurgitation. - Pulmonary arteries: Systolic pressure was within the normal   range. PA peak pressure: 25 mm Hg (S). - Global longitudinal strain -21.7% (normal).   Laboratory Data:  Chemistry Recent Labs  Lab 01/01/18 0645 01/06/18 2010 01/07/18 0107  NA 141 141  --   K 3.9 3.5  --   CL  --  98  --   CO2  --  30  --   GLUCOSE 85 125*  --   BUN  --  11  --   CREATININE  --  4.01* 4.99*  CALCIUM  --  7.9*  --   GFRNONAA  --  11* 9*  GFRAA  --  13* 10*  ANIONGAP  --  13  --  Recent Labs  Lab 01/06/18 2010  PROT 6.4*  ALBUMIN 3.1*  AST 18  ALT <5  ALKPHOS 113  BILITOT 0.5   Hematology Recent Labs  Lab 01/01/18 0645 01/06/18 2010 01/07/18 0107  WBC  --  5.9 6.1  RBC  --  2.28* 2.17*  HGB 7.8* 7.4* 7.0*  HCT 23.0* 23.5* 22.5*  MCV  --  103.1* 103.7*   MCH  --  32.5 32.3  MCHC  --  31.5 31.1  RDW  --  13.2 13.2  PLT  --  186 179   Cardiac Enzymes Recent Labs  Lab 01/06/18 2010 01/07/18 0107 01/07/18 0627 01/07/18 1243  TROPONINI <0.03 <0.03 <0.03 <0.03   No results for input(s): TROPIPOC in the last 168 hours.   DDimer  Recent Labs  Lab 01/06/18 2010  DDIMER 3.98*    Radiology/Studies:  Dg Chest 2 View  Result Date: 01/06/2018 CLINICAL DATA:  Chest pain radiating to back. Atrial fibrillation. End-stage renal disease on dialysis. EXAM: CHEST - 2 VIEW COMPARISON:  10/05/2017 FINDINGS: Stable cardiomegaly. Central venous access catheters remain in appropriate position. Both lungs are clear. No evidence pleural effusion. IMPRESSION: Stable cardiomegaly.  No active lung disease. Electronically Signed   By: Earle Gell M.D.   On: 01/06/2018 19:49   Ct Angio Chest Pe W/cm &/or Wo Cm  Result Date: 01/06/2018 CLINICAL DATA:  Chest pain since dialysis today. History of breast cancer. EXAM: CT ANGIOGRAPHY CHEST WITH CONTRAST TECHNIQUE: Multidetector CT imaging of the chest was performed using the standard protocol during bolus administration of intravenous contrast. Multiplanar CT image reconstructions and MIPs were obtained to evaluate the vascular anatomy. CONTRAST:  153mL ISOVUE-370 IOPAMIDOL (ISOVUE-370) INJECTION 76% COMPARISON:  Chest radiograph January 06, 2018 and CT chest October 05, 2017 the FINDINGS: CARDIOVASCULAR: Adequate contrast opacification of the pulmonary artery's. Main pulmonary artery is not enlarged. No pulmonary arterial filling defects to the level of the subsegmental branches. The heart is moderately enlarged and unchanged. Minimal coronary artery calcifications. Trace pericardial effusion. Thoracic aorta is normal in course and caliber, mild calcific atherosclerosis. Pulmonary venous congestion. MEDIASTINUM/NODES: No lymphadenopathy by CT size criteria. RIGHT chest Port-A-Cath via internal jugular approach and  tunneled LEFT subclavian dialysis catheter with distal tips RIGHT atrium. LEFT subclavian to axillary venous stent, patency could not be confirmed non angiographic phase. LUNGS/PLEURA: Tracheobronchial tree is patent, no pneumothorax. Mild bronchial wall thickening. Trace pleural effusions and dependent atelectasis. UPPER ABDOMEN: Non-acute. Small hiatal hernia. Numerous RIGHT renal cysts, incompletely imaged. LEFT kidney is not included in field of view. Subcentimeter probable cyst LEFT lobe of the liver. MUSCULOSKELETAL: Non-acute. Status post RIGHT mastectomy with small volume crescentic nonenhancing fluid and surgical clips RIGHT chest wall. Midthoracic dextroscoliosis. Review of the MIP images confirms the above findings. IMPRESSION: 1. No acute pulmonary embolism. 2. Similar cardiomegaly with pulmonary vascular congestion. Trace pleural effusions and atelectasis. 3. Interval RIGHT mastectomy. Small seroma without image findings of abscess. Aortic Atherosclerosis (ICD10-I70.0). Electronically Signed   By: Elon Alas M.D.   On: 01/06/2018 22:54    Assessment and Plan:   1. Pericarditis -Patient is ruled out.  Troponin negative x4.  No ischemic evaluation warranted. - presentation is consistent with acute pericarditis given mild elevated temperature yesterday, pain with deep breath and lay down, elevated inflammatory marker, PR depression with minimal ST elevation and pericardial rub. -Will defer management per primary team/nephrology given end-stage renal disease for initiation of colchicine and NSAID.  -CT angiogram negative for PE.  No pericardial effusion noted.  Will get echocardiogram for further evaluation.  2.  Paroxysmal atrial fibrillation -Maintaining sinus rhythm at rate of 80s.  CHADSVASCs score of 1.  Not on anticoagulation.  Previously declined ablation.  3.  Chronic anemia -Plan to transfuse today.  For questions or updates, please contact [place] Please consult  [email or web address] for contact info under     Jarrett Soho, [name]  01/07/2018 1:58 PM   Attending Note:   The patient was seen and examined.  Agree with assessment and plan as noted above.  Changes made to the above note as needed.  Patient seen and independently examined with Robbie Lis, PA .   We discussed all aspects of the encounter. I agree with the assessment and plan as stated above.  1.  Acute pericarditis : We are asked to see the patient for further evaluation regarding some chest pain. The patient has pleuritic chest pain.   She has a 3 component pericardial friction rub ECG shows milid PR depression with mild ST elevation. Sed rate is elevated  Clinically all these are consistent with acute pericarditis. We will check an echocardiogram today. She should be treated with nonsteroidal anti-inflammatory agents.  I am fairly certain that she should not receive colchicine because of her end-stage renal disease but I would defer to nephrology. Would use steroids as a last resort.   She does not appear to be in that much discomfort so hopefully , we will not need to use steroids  In addition, she also has a history of breast cancer.  I doubt that this is pericarditis related to metastatic breast cancer because normally that would be associated with a very large pericardial effusion.  We will be getting an echocardiogram for further assessment of this.    I have spent a total of 40 minutes with patient reviewing hospital  notes , telemetry, EKGs, labs and examining patient as well as establishing an assessment and plan that was discussed with the patient. > 50% of time was spent in direct patient care.    Thayer Headings, Brooke Bonito., MD, [facility] 01/07/2018, 2:58 PM [address]  [place] Pager [phone]

## 2018-01-07 NOTE — Progress Notes (Signed)
Patient has been NPO better part of today per MD order, low volume. Also rec'd IV benadryl per MD order prior to receiving blood

## 2018-01-07 NOTE — ED Notes (Signed)
Phlebotomy at bedside for cultures

## 2018-01-07 NOTE — Progress Notes (Signed)
  Echocardiogram 2D Echocardiogram has been performed.  Karen [name] 01/07/2018, 3:42 PM

## 2018-01-07 NOTE — Progress Notes (Signed)
Pt. transported from ER via stretcher to 3E-21; alert and oriented x4; son at bedside; pt. oriented to room and call button.

## 2018-01-07 NOTE — Progress Notes (Signed)
PROGRESS NOTE        PATIENT DETAILS Name: Karen [name] Age: 62 y.o. Sex: female Date of Birth: 11/20/1955 Admit Date: 01/06/2018 Admitting Physician Rise Patience, MD [id number], Myra Rude, MD  Brief Narrative: Patient is a 62 y.o. female with history of breast cancer-on Herceptin infusion every 3 weeks, ESRD on HD MWF, anemia (required 2 units of PRBC transfusion a few months back) presented to the ED for evaluation of retrosternal chest pain.  Was also found to be febrile on further evaluation.  Admitted to the hospitalist service for further evaluation and treatment.  See below for further details.  Subjective: Continues to have intermittent retrosternal chest pain throughout this morning but milder than yesterday.  Appears comfortable.  Assessment/Plan: Chest pain: With both typical and atypical features-troponins not elevated-EKG nonacute.  Could be worsened by anemia-hence will transfuse 1 unit of PRBC today.  Awaiting cardiology input.  Fever: Immunocompromised patient-on Herceptin infusions via a right-sided Port-A-Cath every 3 weeks-also on HD.  HD catheter site looks clean.  Chest x-ray/CT chest without any infiltrates.  Await influenza PCR-although does not have symptoms suggestive of influenza.  No other foci of infection evident on physical exam.  Await blood cultures (has Port-A-Cath/HD catheter)-given clinical stability-reasonable to monitor off antimicrobial therapy.  However if fever reoccurs-we will start empiric antimicrobial therapy.  Anemia: Suspect has anemia of chronic disease related to underlying malignancy/ESRD at baseline-perhaps worsened by acute illness.  No evidence of overt GI blood loss at this time.  Claims she has had transfusion approximately 2 months back at the cancer center.  Given chest pain-with a hemoglobin of 7-have ordered 1 unit of PRBC.  Will recheck CBC tomorrow-if further units are required will transfuse with HD  tomorrow.  ESRD: HD MWF-nephrology following  History of PAF: On diltiazem-but blood pressure soft hence we will stop.  Followed by cardiology in the outpatient setting-due to her low chads2Vasc score of 1-not on any anticoagulation.  Breast cancer: Status post recent mastectomy-followed by oncology at the cancer center-on Herceptin infusion every 3 weeks through a right-sided Port-A-Cath.  Dyslipidemia: Continue statin  DVT Prophylaxis:  SCD's-given anemia  Code Status: Full code or DNR  Family Communication: None at bedside  Disposition Plan: Remain inpatient-requires further evaluation of fever-and assurance of negative blood cultures before consideration of discharge.  Antimicrobial agents: Anti-infectives (From admission, onward)   None      Procedures: None  CONSULTS:  cardiology and nephrology  Time spent: 25- minutes-Greater than 50% of this time was spent in counseling, explanation of diagnosis, planning of further management, and coordination of care.  MEDICATIONS: Scheduled Meds: . sodium chloride   Intravenous Once  . acetaminophen  650 mg Oral Once  . aspirin EC  81 mg Oral Daily  . atorvastatin  10 mg Oral QHS  . diltiazem  15 mg Oral Q M,W,F  . diphenhydrAMINE  25 mg Intravenous Once  . multivitamin  1 tablet Oral QHS  . pantoprazole  40 mg Oral Daily  . sevelamer carbonate  1,600 mg Oral TID WC   Continuous Infusions: PRN Meds:.acetaminophen, morphine injection, nitroGLYCERIN, ondansetron (ZOFRAN) IV, oxyCODONE-acetaminophen, sevelamer carbonate, sodium chloride flush   PHYSICAL EXAM: Vital signs: Vitals:   01/07/18 0129 01/07/18 0513 01/07/18 0957 01/07/18 1301  BP: 129/72 92/60 (!) 79/47 (!) 94/57  Pulse: (!) 108  87 70  Resp:  18  18 18   Temp: (!) 100.6 F (38.1 C)  99.7 F (37.6 C) 98 F (36.7 C)  TempSrc: Oral  Oral Oral  SpO2: 100%  100% 100%  Weight:      Height:       Filed Weights   01/06/18 1901  Weight: 86.2 kg   Body  mass index is 35.9 kg/m.   General appearance :Awake, alert, not in any distress.  Eyes:Pink conjunctiva HEENT: Atraumatic and Normocephalic Neck: supple Resp:Good air entry bilaterally, no added sounds  CVS: S1 S2 regular, no murmurs.  GI: Bowel sounds present, Non tender and not distended with no gaurding, rigidity or rebound. Extremities: B/L Lower Ext shows no edema, both legs are warm to touch Neurology:  speech clear,Non focal, sensation is grossly intact. Psychiatric: Normal judgment and insight. Alert and oriented x 3. Normal mood. Musculoskeletal:No digital cyanosis Skin:No Rash, warm and dry Wounds:N/A  I have personally reviewed following labs and imaging studies  LABORATORY DATA: CBC: Recent Labs  Lab 01/01/18 0645 01/06/18 2010 01/07/18 0107  WBC  --  5.9 6.1  NEUTROABS  --  4.4  --   HGB 7.8* 7.4* 7.0*  HCT 23.0* 23.5* 22.5*  MCV  --  103.1* 103.7*  PLT  --  186 347    Basic Metabolic Panel: Recent Labs  Lab 01/01/18 0645 01/06/18 2010 01/07/18 0107  NA 141 141  --   K 3.9 3.5  --   CL  --  98  --   CO2  --  30  --   GLUCOSE 85 125*  --   BUN  --  11  --   CREATININE  --  4.01* 4.99*  CALCIUM  --  7.9*  --     GFR: Estimated Creatinine Clearance: 11.7 mL/min (A) (by C-G formula based on SCr of 4.99 mg/dL (H)).  Liver Function Tests: Recent Labs  Lab 01/06/18 2010  AST 18  ALT <5  ALKPHOS 113  BILITOT 0.5  PROT 6.4*  ALBUMIN 3.1*   No results for input(s): LIPASE, AMYLASE in the last 168 hours. No results for input(s): AMMONIA in the last 168 hours.  Coagulation Profile: No results for input(s): INR, PROTIME in the last 168 hours.  Cardiac Enzymes: Recent Labs  Lab 01/06/18 2010 01/07/18 0107 01/07/18 0627 01/07/18 1243  TROPONINI <0.03 <0.03 <0.03 <0.03    BNP (last 3 results) No results for input(s): PROBNP in the last 8760 hours.  HbA1C: No results for input(s): HGBA1C in the last 72 hours.  CBG: No results for  input(s): GLUCAP in the last 168 hours.  Lipid Profile: No results for input(s): CHOL, HDL, LDLCALC, TRIG, CHOLHDL, LDLDIRECT in the last 72 hours.  Thyroid Function Tests: No results for input(s): TSH, T4TOTAL, FREET4, T3FREE, THYROIDAB in the last 72 hours.  Anemia Panel: No results for input(s): VITAMINB12, FOLATE, FERRITIN, TIBC, IRON, RETICCTPCT in the last 72 hours.  Urine analysis:    Component Value Date/Time   COLORURINE YELLOW 12/17/2014 1550   APPEARANCEUR TURBID (A) 12/17/2014 1550   LABSPEC 1.011 12/17/2014 1550   PHURINE 8.5 (H) 12/17/2014 1550   GLUCOSEU NEGATIVE 12/17/2014 1550   HGBUR LARGE (A) 12/17/2014 1550   BILIRUBINUR NEGATIVE 12/17/2014 1550   KETONESUR NEGATIVE 12/17/2014 1550   PROTEINUR 100 (A) 12/17/2014 1550   UROBILINOGEN 0.2 05/29/2013 1509   NITRITE NEGATIVE 12/17/2014 1550   LEUKOCYTESUR LARGE (A) 12/17/2014 1550    Sepsis Labs: Lactic Acid, Venous  Component Value Date/Time   LATICACIDVEN 2.0 12/18/2014 1220    MICROBIOLOGY: Recent Results (from the past 240 hour(s))  MRSA PCR Screening     Status: None   Collection Time: 01/07/18  5:01 AM  Result Value Ref Range Status   MRSA by PCR NEGATIVE NEGATIVE Final    Comment:        The GeneXpert MRSA Assay (FDA approved for NASAL specimens only), is one component of a comprehensive MRSA colonization surveillance program. It is not intended to diagnose MRSA infection nor to guide or monitor treatment for MRSA infections. Performed at [facility], [place] [address] [place], [place] [address]     RADIOLOGY STUDIES/RESULTS: Dg Chest 2 View  Result Date: 01/06/2018 CLINICAL DATA:  Chest pain radiating to back. Atrial fibrillation. End-stage renal disease on dialysis. EXAM: CHEST - 2 VIEW COMPARISON:  10/05/2017 FINDINGS: Stable cardiomegaly. Central venous access catheters remain in appropriate position. Both lungs are clear. No evidence pleural effusion. IMPRESSION: Stable  cardiomegaly.  No active lung disease. Electronically Signed   By: Earle Gell M.D.   On: 01/06/2018 19:49   Ct Angio Chest Pe W/cm &/or Wo Cm  Result Date: 01/06/2018 CLINICAL DATA:  Chest pain since dialysis today. History of breast cancer. EXAM: CT ANGIOGRAPHY CHEST WITH CONTRAST TECHNIQUE: Multidetector CT imaging of the chest was performed using the standard protocol during bolus administration of intravenous contrast. Multiplanar CT image reconstructions and MIPs were obtained to evaluate the vascular anatomy. CONTRAST:  171mL ISOVUE-370 IOPAMIDOL (ISOVUE-370) INJECTION 76% COMPARISON:  Chest radiograph January 06, 2018 and CT chest October 05, 2017 the FINDINGS: CARDIOVASCULAR: Adequate contrast opacification of the pulmonary artery's. Main pulmonary artery is not enlarged. No pulmonary arterial filling defects to the level of the subsegmental branches. The heart is moderately enlarged and unchanged. Minimal coronary artery calcifications. Trace pericardial effusion. Thoracic aorta is normal in course and caliber, mild calcific atherosclerosis. Pulmonary venous congestion. MEDIASTINUM/NODES: No lymphadenopathy by CT size criteria. RIGHT chest Port-A-Cath via internal jugular approach and tunneled LEFT subclavian dialysis catheter with distal tips RIGHT atrium. LEFT subclavian to axillary venous stent, patency could not be confirmed non angiographic phase. LUNGS/PLEURA: Tracheobronchial tree is patent, no pneumothorax. Mild bronchial wall thickening. Trace pleural effusions and dependent atelectasis. UPPER ABDOMEN: Non-acute. Small hiatal hernia. Numerous RIGHT renal cysts, incompletely imaged. LEFT kidney is not included in field of view. Subcentimeter probable cyst LEFT lobe of the liver. MUSCULOSKELETAL: Non-acute. Status post RIGHT mastectomy with small volume crescentic nonenhancing fluid and surgical clips RIGHT chest wall. Midthoracic dextroscoliosis. Review of the MIP images confirms the above  findings. IMPRESSION: 1. No acute pulmonary embolism. 2. Similar cardiomegaly with pulmonary vascular congestion. Trace pleural effusions and atelectasis. 3. Interval RIGHT mastectomy. Small seroma without image findings of abscess. Aortic Atherosclerosis (ICD10-I70.0). Electronically Signed   By: Elon Alas M.D.   On: 01/06/2018 22:54     LOS: 0 days   Oren Binet, MD  [facility]  If 7PM-7AM, please contact night-coverage  Please page via [email or web address] [email or web address]-click on MD name and type text message  01/07/2018, 1:56 PM

## 2018-01-07 NOTE — Consult Note (Addendum)
[facility] Renal Consultation Note    Indication for Consultation:  Management of ESRD/hemodialysis; anemia, hypertension/volume and secondary hyperparathyroidism  HPI: Karen [name] is a 62 y.o. female with ESRD on HD MWF at [facility]. 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  Grade 1 diastolic dysfunction per echo 12/09/14, followed by Dr. Gwenlyn Found. EF 60-65%, Atrial fibrillation.  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 Now Herceptin q 3 wks x 1 year.   Admitted under observation status for acute onset of chest pain. Chest pain began during last 15 minutes of dialysis yesterday. Seemed to subside after HD then began again at home in the evening and patient called EMS.   ED evaluation significant for elevated temp 100.92F on admission, EKG with nonspecific ST-T changes, CT angio chest neg for PE, CXR clear. Troponin neg. Labs: Na 141, K 3.5, BUN 11, Cr 4.01, WBC 5.9, Hgb 7.4.   Seen and examined at bedside. She describes L sided chest pressure that radiates into L arm. Feels like something sitting on chest. Pain comes and goes and is worse with deep breaths. Denies cough, SOB, abd pain, N/V/D.   Last dialysis was yesterday. She completed a full treatment and left at her dry weight. She has been compliant with treatments. Dialyzes via [place]. She had a new R AVG placed 12/17 per Dr. Oneida Alar. She reports that the chest pain started at the end of dialysis with a drop in BP and improved but then returned after she went home.    Past Medical History:  Diagnosis Date  . Anemia   . Arthritis    knees  . Breast cancer ([place]) 2019   Right Breast Cancer  . Cancer ([place])   . Chest pain Jan 2016   low risk Myoview   . CHF (congestive heart failure) ([place])   . Complication of anesthesia 2005   difficulty remembering for a while and waking up  . Constipation   . Dysrhythmia     h/o A-Fib  . ESRD on dialysis [facility]) April 2016   MWF  . GERD (gastroesophageal reflux disease)   . Heart murmur Nov 2015   Aortic scleosis- no stenosis  . Hemodialysis patient ([place])   . Hypertension   . Shortness of breath dyspnea    with exertion   Past Surgical History:  Procedure Laterality Date  . ABDOMINAL HYSTERECTOMY  2005  . AV FISTULA PLACEMENT Left 12/09/2013   Procedure: INSERTION OF ARTERIOVENOUS (AV) GORE-TEX GRAFT ARM;  Surgeon: Elam Dutch, MD;  Location: [facility] OR;  Service: Vascular;  Laterality: Left;  . AV FISTULA PLACEMENT Right 01/01/2018   Procedure: INSERTION OF ARTERIOVENOUS (AV) GORE-TEX GRAFT ARM RIGHT ARM;  Surgeon: Elam Dutch, MD;  Location: [place];  Service: Vascular;  Laterality: Right;  . BREAST BIOPSY  1990's  . BUNIONECTOMY Bilateral   . CHOLECYSTECTOMY  2005  . COLONOSCOPY    . IR GENERIC HISTORICAL  01/11/2016   IR US GUIDE VASC ACCESS RIGHT 01/11/2016 Corrie Mckusick, DO [facility]-INTERV RAD  . IR GENERIC HISTORICAL  01/11/2016   IR RADIOLOGY PERIPHERAL GUIDED IV START 01/11/2016 Corrie Mckusick, DO [facility]-INTER[facility]  . IR IMAGING GUIDED PORT INSERTION  07/05/2017  . 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 KNEE ARTHROSCOPY Bilateral   . MASTECTOMY W/ SENTINEL NODE BIOPSY Right 11/20/2017  . MASTECTOMY W/ SENTINEL NODE BIOPSY Right 11/20/2017  Procedure: RIGHT MASTECTOMY WITH SENTINEL LYMPH NODE BIOPSY;  Surgeon: Coralie Keens, MD;  Location: [place];  Service: General;  Laterality: Right;  . PERIPHERAL VASCULAR CATHETERIZATION N/A 08/31/2015   Procedure: A/V Shuntogram;  Surgeon: Serafina Mitchell, MD;  Location: [place] CV [facility];  Service: Cardiovascular;  Laterality: N/A;  . PERIPHERAL VASCULAR CATHETERIZATION Left 08/31/2015   Procedure: Peripheral Vascular Balloon Angioplasty;  Surgeon: Serafina Mitchell, MD;  Location: [place] [facility];  Service: Cardiovascular;  Laterality: Left;  arm fistula  . ROTATOR CUFF REPAIR Right 1997  . UPPER EXTREMITY  VENOGRAPHY Right 12/24/2017   Procedure: UPPER EXTREMITY VENOGRAPHY CENTRAL VENOGRAM;  Surgeon: Waynetta Sandy, MD;  Location: [place] [facility];  Service: Cardiovascular;  Laterality: Right;   Family History  Problem Relation Age of Onset  . Kidney disease Mother   . Heart attack Father   . Kidney disease Father   . Diabetes Sister   . Hyperlipidemia Sister   . Hypertension Sister   . Kidney disease Sister        x2   Social History:  reports that she has never smoked. She has never used smokeless tobacco. She reports that she does not drink alcohol or use drugs. No Known Allergies Prior to Admission medications   Medication Sig Start Date End Date Taking? Authorizing Provider  acetaminophen (TYLENOL) 500 MG tablet Take 1,000 mg by mouth daily as needed for mild pain, moderate pain or headache.    Yes [provider]  amoxicillin (AMOXIL) 500 MG capsule Take 2,000 mg by mouth See admin instructions. Take 4 capsules (2000 mg) by mouth one hour prior to dental procedures   Yes [provider]  aspirin EC 81 MG tablet Take 81 mg by mouth daily.   Yes [provider]  clobetasol cream (TEMOVATE) 9.73 % Apply 1 application topically 2 (two) times daily.   Yes [provider]  diltiazem (CARDIZEM) 30 MG tablet Take 15 mg by mouth every Monday, Wednesday, and Friday.    Yes [provider]  lidocaine-prilocaine (EMLA) cream Apply 1 application topically See admin instructions. Apply topically Monday, Wednesday and Friday before dialysis 05/26/15  Yes [provider]  multivitamin (RENA-VIT) TABS tablet Take 1 tablet by mouth daily.   Yes [provider]  ondansetron (ZOFRAN) 8 MG tablet Take 1 tablet (8 mg total) by mouth 2 (two) times daily as needed (Nausea or vomiting). 07/02/17  Yes Nicholas Lose, MD  oxyCODONE-acetaminophen (PERCOCET/ROXICET) 5-325 MG tablet Take 1 tablet by mouth every 6 (six) hours as needed. Patient  taking differently: Take 1 tablet by mouth every 6 (six) hours as needed for moderate pain.  01/01/18  Yes Laurence Slate M, PA-C  pantoprazole (PROTONIX) 40 MG tablet TAKE 1 TABLET (40 MG TOTAL) BY MOUTH DAILY. Patient taking differently: Take 40 mg by mouth daily.  07/23/17  Yes Lorretta Harp, MD  prochlorperazine (COMPAZINE) 10 MG tablet Take 1 tablet (10 mg total) by mouth every 6 (six) hours as needed (Nausea or vomiting). 07/02/17  Yes Nicholas Lose, MD  sevelamer carbonate (RENVELA) 800 MG tablet Take 800-1,600 mg by mouth See admin instructions. 1600mg  three times daily and 800mg  twice daily with snacks   Yes [provider]  simvastatin (ZOCOR) 20 MG tablet TAKE 1 TABLET BY MOUTH EVERYDAY AT BEDTIME Patient taking differently: Take 20 mg by mouth at bedtime.  10/22/17  Yes Kilroy, Doreene Burke, PA-C   Current Facility-Administered Medications  Medication Dose Route  Frequency Provider Last Rate Last Dose  . 0.9 %  sodium chloride infusion (Manually program via Guardrails IV Fluids)   Intravenous Once Jonetta Osgood, MD      . acetaminophen (TYLENOL) tablet 650 mg  650 mg Oral Q4H PRN Rise Patience, MD   650 mg at 01/07/18 0227  . acetaminophen (TYLENOL) tablet 650 mg  650 mg Oral Once Jonetta Osgood, MD      . aspirin EC tablet 81 mg  81 mg Oral Daily Rise Patience, MD   81 mg at 01/07/18 0844  . diltiazem (CARDIZEM) tablet 15 mg  15 mg Oral Q M,W,F Rise Patience, MD   15 mg at 01/07/18 0844  . diphenhydrAMINE (BENADRYL) injection 25 mg  25 mg Intravenous Once Ghimire, Henreitta Leber, MD      . morphine 2 MG/ML injection 2 mg  2 mg Intravenous Q2H PRN Rise Patience, MD      . multivitamin (RENA-VIT) tablet 1 tablet  1 tablet Oral QHS Rise Patience, MD      . nitroGLYCERIN (NITROSTAT) SL tablet 0.4 mg  0.4 mg Sublingual Q5 min PRN Rise Patience, MD      . ondansetron [facility]) injection 4 mg  4 mg Intravenous Q6H PRN Rise Patience, MD       . oxyCODONE-acetaminophen (PERCOCET/ROXICET) 5-325 MG per tablet 1 tablet  1 tablet Oral Q6H PRN Rise Patience, MD   1 tablet at 01/07/18 0845  . pantoprazole (PROTONIX) EC tablet 40 mg  40 mg Oral Daily Rise Patience, MD   40 mg at 01/07/18 0845  . sevelamer carbonate (RENVELA) tablet 1,600 mg  1,600 mg Oral TID WC Rise Patience, MD   1,600 mg at 01/07/18 0844  . sevelamer carbonate (RENVELA) tablet 800 mg  800 mg Oral PRN Rise Patience, MD      . simvastatin (ZOCOR) tablet 20 mg  20 mg Oral QHS Rise Patience, MD      . sodium chloride flush (NS) 0.9 % injection 10-40 mL  10-40 mL Intracatheter PRN Rise Patience, MD   10 mL at 01/07/18 0813    ROS: As per HPI otherwise negative.  Physical Exam: Vitals:   01/07/18 0015 01/07/18 0129 01/07/18 0513 01/07/18 0957  BP: (!) 98/56 129/72 92/60 (!) 79/47  Pulse: (!) 101 (!) 108  87  Resp: (!) 22 18  18   Temp:  (!) 100.6 F (38.1 C)  99.7 F (37.6 C)  TempSrc:  Oral  Oral  SpO2: 99% 100%  100%  Weight:      Height:         General: WDWN AAF NAD on nasal oxygen  Head: NCAT sclera not icteric MMM Neck: Supple. No JVD  Lungs: CTA bilaterally without wheezes, rales, or rhonchi. Breathing is unlabored. Heart: RRR with S1 S2 Abdomen: soft NT + BS Lower extremities:without edema or ischemic changes, no open wounds  Neuro: A & O  X 3. Moves all extremities spontaneously. Psych:  Responds to questions appropriately with a normal affect. Dialysis Access: L chest TDC; dsg clean; New R A AVG +bruit   Labs: Basic Metabolic Panel: Recent Labs  Lab 01/01/18 0645 01/06/18 2010 01/07/18 0107  NA 141 141  --   K 3.9 3.5  --   CL  --  98  --   CO2  --  30  --   GLUCOSE 85 125*  --  BUN  --  11  --   CREATININE  --  4.01* 4.99*  CALCIUM  --  7.9*  --    Liver Function Tests: Recent Labs  Lab 01/06/18 2010  AST 18  ALT <5  ALKPHOS 113  BILITOT 0.5  PROT 6.4*  ALBUMIN 3.1*   No results for  input(s): LIPASE, AMYLASE in the last 168 hours. No results for input(s): AMMONIA in the last 168 hours. CBC: Recent Labs  Lab 01/01/18 0645 01/06/18 2010 01/07/18 0107  WBC  --  5.9 6.1  NEUTROABS  --  4.4  --   HGB 7.8* 7.4* 7.0*  HCT 23.0* 23.5* 22.5*  MCV  --  103.1* 103.7*  PLT  --  186 179   Cardiac Enzymes: Recent Labs  Lab 01/06/18 2010 01/07/18 0107 01/07/18 0627  TROPONINI <0.03 <0.03 <0.03   CBG: No results for input(s): GLUCAP in the last 168 hours. Iron Studies: No results for input(s): IRON, TIBC, TRANSFERRIN, FERRITIN in the last 72 hours. Studies/Results: Dg Chest 2 View  Result Date: 01/06/2018 CLINICAL DATA:  Chest pain radiating to back. Atrial fibrillation. End-stage renal disease on dialysis. EXAM: CHEST - 2 VIEW COMPARISON:  10/05/2017 FINDINGS: Stable cardiomegaly. Central venous access catheters remain in appropriate position. Both lungs are clear. No evidence pleural effusion. IMPRESSION: Stable cardiomegaly.  No active lung disease. Electronically Signed   By: Earle Gell M.D.   On: 01/06/2018 19:49   Ct Angio Chest Pe W/cm &/or Wo Cm  Result Date: 01/06/2018 CLINICAL DATA:  Chest pain since dialysis today. History of breast cancer. EXAM: CT ANGIOGRAPHY CHEST WITH CONTRAST TECHNIQUE: Multidetector CT imaging of the chest was performed using the standard protocol during bolus administration of intravenous contrast. Multiplanar CT image reconstructions and MIPs were obtained to evaluate the vascular anatomy. CONTRAST:  174mL ISOVUE-370 IOPAMIDOL (ISOVUE-370) INJECTION 76% COMPARISON:  Chest radiograph January 06, 2018 and CT chest October 05, 2017 the FINDINGS: CARDIOVASCULAR: Adequate contrast opacification of the pulmonary artery's. Main pulmonary artery is not enlarged. No pulmonary arterial filling defects to the level of the subsegmental branches. The heart is moderately enlarged and unchanged. Minimal coronary artery calcifications. Trace  pericardial effusion. Thoracic aorta is normal in course and caliber, mild calcific atherosclerosis. Pulmonary venous congestion. MEDIASTINUM/NODES: No lymphadenopathy by CT size criteria. RIGHT chest Port-A-Cath via internal jugular approach and tunneled LEFT subclavian dialysis catheter with distal tips RIGHT atrium. LEFT subclavian to axillary venous stent, patency could not be confirmed non angiographic phase. LUNGS/PLEURA: Tracheobronchial tree is patent, no pneumothorax. Mild bronchial wall thickening. Trace pleural effusions and dependent atelectasis. UPPER ABDOMEN: Non-acute. Small hiatal hernia. Numerous RIGHT renal cysts, incompletely imaged. LEFT kidney is not included in field of view. Subcentimeter probable cyst LEFT lobe of the liver. MUSCULOSKELETAL: Non-acute. Status post RIGHT mastectomy with small volume crescentic nonenhancing fluid and surgical clips RIGHT chest wall. Midthoracic dextroscoliosis. Review of the MIP images confirms the above findings. IMPRESSION: 1. No acute pulmonary embolism. 2. Similar cardiomegaly with pulmonary vascular congestion. Trace pleural effusions and atelectasis. 3. Interval RIGHT mastectomy. Small seroma without image findings of abscess. Aortic Atherosclerosis (ICD10-I70.0). Electronically Signed   By: Elon Alas M.D.   On: 01/06/2018 22:54    Dialysis Orders:  [facility] MWF  -4h 400/1.5x EDW 85.5kg 2K/2.25Ca Profile 4 Na Linear TDC Heparin bolus 4000 U -Parsabiv 2.5mg  IV TIW -No VDRA/ No ESA   Assessment/Plan: 1. Chest pain - Troponin neg. W/u per primary.  Cardiology consulted 2. Fever - Unclear  etioloy. Pt is immunosuppressed on chemo. Also has dialysis catheter.  Blood cultures pending.  3. ESRD - MWF. Following holiday schedule (SunTueFri). Next HD 12/24  4. Hypertension/volume  - BP low on admit/Volume stable. No volume excess on exam. UF to EDW as tolerated 5. Anemia  - Hgb 7.4>7.0. No ESA 2/2 cancer dx, Check FOBT. Transfuse with HD tomorrow  if <7 6. Metabolic bone disease -  Ca ok. Check Phos with HD. Continue Renvela binder.  7. Nutrition - Renal diet/vitamins 8. Atrial fib - on diltiazem  9. R breast cancer s/p R mastectomy. On Herceptin q 3 weeks.   Lynnda Child PA-C [facility] Pager [phone] 01/07/2018, 10:48 AM   I have seen and examined this patient and agree with plan and assessment in the above note with renal recommendations/intervention highlighted.  Pt seen and examined.  Currently with slight pressure.  Cardiology workup underway.  Plan for HD tomorrow here if she remains an inpatient.  Governor Rooks Abishai Viegas,MD 01/07/2018 12:49 PM

## 2018-01-07 NOTE — Plan of Care (Signed)
  Problem: Education: Goal: Knowledge of General Education information will improve Description Including pain rating scale, medication(s)/side effects and non-pharmacologic comfort measures Outcome: Progressing Note:  POC and orders reviewed with pt./son.

## 2018-01-08 ENCOUNTER — Other Ambulatory Visit: Payer: Medicare Other

## 2018-01-08 ENCOUNTER — Ambulatory Visit: Payer: Medicare Other | Admitting: Oncology

## 2018-01-08 ENCOUNTER — Telehealth: Payer: Self-pay

## 2018-01-08 ENCOUNTER — Ambulatory Visit: Payer: Medicare Other

## 2018-01-08 DIAGNOSIS — Z9011 Acquired absence of right breast and nipple: Secondary | ICD-10-CM | POA: Diagnosis not present

## 2018-01-08 DIAGNOSIS — I300 Acute nonspecific idiopathic pericarditis: Secondary | ICD-10-CM

## 2018-01-08 DIAGNOSIS — I480 Paroxysmal atrial fibrillation: Secondary | ICD-10-CM | POA: Diagnosis not present

## 2018-01-08 DIAGNOSIS — R072 Precordial pain: Secondary | ICD-10-CM

## 2018-01-08 DIAGNOSIS — N186 End stage renal disease: Secondary | ICD-10-CM | POA: Diagnosis not present

## 2018-01-08 LAB — CBC
HCT: 29.4 % — ABNORMAL LOW (ref 36.0–46.0)
Hemoglobin: 9.4 g/dL — ABNORMAL LOW (ref 12.0–15.0)
MCH: 31.2 pg (ref 26.0–34.0)
MCHC: 32 g/dL (ref 30.0–36.0)
MCV: 97.7 fL (ref 80.0–100.0)
Platelets: 247 10*3/uL (ref 150–400)
RBC: 3.01 MIL/uL — AB (ref 3.87–5.11)
RDW: 19 % — ABNORMAL HIGH (ref 11.5–15.5)
WBC: 6.4 10*3/uL (ref 4.0–10.5)
nRBC: 0 % (ref 0.0–0.2)

## 2018-01-08 LAB — RENAL FUNCTION PANEL
Albumin: 3.3 g/dL — ABNORMAL LOW (ref 3.5–5.0)
Anion gap: 15 (ref 5–15)
BUN: 38 mg/dL — ABNORMAL HIGH (ref 8–23)
CO2: 29 mmol/L (ref 22–32)
Calcium: 8 mg/dL — ABNORMAL LOW (ref 8.9–10.3)
Chloride: 96 mmol/L — ABNORMAL LOW (ref 98–111)
Creatinine, Ser: 8.68 mg/dL — ABNORMAL HIGH (ref 0.44–1.00)
GFR calc Af Amer: 5 mL/min — ABNORMAL LOW (ref >60)
GFR calc non Af Amer: 4 mL/min — ABNORMAL LOW (ref >60)
Glucose, Bld: 125 mg/dL — ABNORMAL HIGH (ref 70–99)
Phosphorus: 3.5 mg/dL (ref 2.5–4.6)
Potassium: 4 mmol/L (ref 3.5–5.1)
SODIUM: 140 mmol/L (ref 135–145)

## 2018-01-08 LAB — BPAM RBC
Blood Product Expiration Date: 202001202359
ISSUE DATE / TIME: 201912231350
UNIT TYPE AND RH: 7300

## 2018-01-08 LAB — TYPE AND SCREEN
ABO/RH(D): B POS
Antibody Screen: NEGATIVE
Unit division: 0

## 2018-01-08 MED ORDER — COLCHICINE 0.6 MG PO TABS: 0.6000 mg | ORAL_TABLET | Freq: Every day | ORAL | 0 refills | Status: DC

## 2018-01-08 MED ORDER — HEPARIN SODIUM (PORCINE) 1000 UNIT/ML IJ SOLN
INTRAMUSCULAR | Status: AC
  Administered 2018-01-08: 3800 [IU]
  Filled 2018-01-08: qty 4

## 2018-01-08 MED ORDER — HEPARIN SOD (PORK) LOCK FLUSH 100 UNIT/ML IV SOLN
500.0000 [IU] | INTRAVENOUS | Status: AC | PRN
  Administered 2018-01-08: 500 [IU]

## 2018-01-08 MED ORDER — MIDODRINE HCL 5 MG PO TABS
ORAL_TABLET | ORAL | Status: AC
  Administered 2018-01-08: 5 mg via ORAL
  Filled 2018-01-08: qty 1

## 2018-01-08 MED ORDER — MIDODRINE HCL 5 MG PO TABS
5.0000 mg | ORAL_TABLET | Freq: Three times a day (TID) | ORAL | Status: DC
  Administered 2018-01-08 (×3): 5 mg via ORAL
  Filled 2018-01-08 (×2): qty 1

## 2018-01-08 MED ORDER — IBUPROFEN 600 MG PO TABS: 600.0000 mg | ORAL_TABLET | Freq: Three times a day (TID) | ORAL | 0 refills | Status: DC

## 2018-01-08 NOTE — Progress Notes (Addendum)
SATURATION QUALIFICATIONS: (This note is used to comply with regulatory documentation for home oxygen)  Patient Saturations on Room Air at Rest = 96%  Patient Saturations on Room Air while Ambulating = 97%  Patient stated while ambulating she felt light headedness in the frontal part of her head.  Spoke to MD

## 2018-01-08 NOTE — Discharge Summary (Signed)
PATIENT DETAILS Name: Karen [name] Age: 62 y.o. Sex: female Date of Birth: March 17, 1955 MRN: [id number]. Admitting Physician: Rise Patience, MD [id number], Myra Rude, MD  Admit Date: 01/06/2018 Discharge date: 01/08/2018  Recommendations for Outpatient Follow-up:  1. Follow up with PCP in 1-2 weeks 2. Please obtain BMP/CBC in one week 3. Ibuprofen for 2 weeks, colchicine for 6 weeks 4. Resume aspirin once patient completes ibuprofen 5. Ensure follow-up with cardiology   Admitted From:  Home  Disposition: [place]: No  Equipment/Devices: None  Discharge Condition: Stable  CODE STATUS: FULL CODE  Diet recommendation:  Heart Healthy   Brief Summary: See H&P, Labs, Consult and Test reports for all details in brief,Patient is a 62 y.o. female with history of breast cancer-on Herceptin infusion every 3 weeks, ESRD on HD MWF, anemia (required 2 units of PRBC transfusion a few months back) presented to the ED for evaluation of retrosternal chest pain and low-grade fever, found to have pericarditis.  See below for further details.  Brief Hospital Course: Pericarditis: Admitted with retrosternal chest pain-evaluated by cardiology-thought to have pericarditis.  Subsequently started on ibuprofen and colchicine with significant clinical improvement.  Hardly any chest pain this morning-feels much better and is requesting discharge.  Echocardiogram without any pericardial effusion-EF is preserved.  Spoke with Dr. Nahser-recommendations are for NSAID 2 weeks, and for colchicine around 6 weeks.  Patient has an appointment with cardiology-further optimization/adjustment of this regimen can be done then.  Etiology likely viral-although has ESRD-very compliant with HD and not thought to be uremic pericarditis, not thought to have malignancy related pericarditis although patient does have a history of breast cancer (status post mastectomy-and on Herceptin infusion).  Fever:   Likely related to above-no evidence of infection on UA/chest x-ray.  Blood cultures were negative.  Influenza PCR was also negative.  Patient was monitored off antimicrobial therapy-has been afebrile overnight-since suspicion is that fever could be from pericarditis-continue to monitor closely in the outpatient setting.  Anemia: Suspect has anemia of chronic disease related to underlying malignancy/ESRD at baseline-perhaps worsened by acute illness.  No evidence of overt GI blood loss at this time.  Claims she has had transfusion approximately 2 months back at the cancer center.    Patient was transfused 1 unit of PRBC-posttransfusion hemoglobin is stable.  Continue to closely follow-up with oncology and nephrology.  ESRD: HD MWF-nephrology followed closely during this hospital stay-patient asked to resume usual schedule  History of PAF: On diltiazem-but blood pressure soft hence have been discontinued for now.  Followed by cardiology in the outpatient setting-due to her low chads2Vasc score of 1-not on any anticoagulation.  Breast cancer: Status post recent mastectomy-followed by oncology at the cancer center-on Herceptin infusion every 3 weeks through a right-sided Port-A-Cath.  Dyslipidemia: Continue statin  Procedures/Studies: None  Discharge Diagnoses:  Principal Problem:   Chest pain Active Problems:   End stage renal disease on dialysis [facility])   Paroxysmal atrial fibrillation (HCC)   S/P mastectomy, right   Acute idiopathic pericarditis   Discharge Instructions:  Activity:  As tolerated   Discharge Instructions    Diet - low sodium heart healthy   Complete by:  As directed    Discharge instructions   Complete by:  As directed    Follow with Primary MD  Lucianne Lei, MD in 1 week  Follow-up with your nephrologist and your hemodialysis clinic at your usual schedule  Continue ibuprofen for 2 weeks, once you have completed ibuprofen-resume  aspirin  Please get a  complete blood count and chemistry panel checked by your Primary MD at your next visit, and again as instructed by your Primary MD.  Get Medicines reviewed and adjusted: Please take all your medications with you for your next visit with your Primary MD  Laboratory/radiological data: Please request your Primary MD to go over all hospital tests and procedure/radiological results at the follow up, please ask your Primary MD to get all Hospital records sent to his/her office.  In some cases, they will be blood work, cultures and biopsy results pending at the time of your discharge. Please request that your primary care M.D. follows up on these results.  Also Note the following: If you experience worsening of your admission symptoms, develop shortness of breath, life threatening emergency, suicidal or homicidal thoughts you must seek medical attention immediately by calling 911 or calling your MD immediately  if symptoms less severe.  You must read complete instructions/literature along with all the possible adverse reactions/side effects for all the Medicines you take and that have been prescribed to you. Take any new Medicines after you have completely understood and accpet all the possible adverse reactions/side effects.   Do not drive when taking Pain medications or sleeping medications (Benzodaizepines)  Do not take more than prescribed Pain, Sleep and Anxiety Medications. It is not advisable to combine anxiety,sleep and pain medications without talking with your primary care practitioner  Special Instructions: If you have smoked or chewed Tobacco  in the last 2 yrs please stop smoking, stop any regular Alcohol  and or any Recreational drug use.  Wear Seat belts while driving.  Please note: You were cared for by a hospitalist during your hospital stay. Once you are discharged, your primary care physician will handle any further medical issues. Please note that NO REFILLS for any discharge  medications will be authorized once you are discharged, as it is imperative that you return to your primary care physician (or establish a relationship with a primary care physician if you do not have one) for your post hospital discharge needs so that they can reassess your need for medications and monitor your lab values.   Increase activity slowly   Complete by:  As directed      Allergies as of 01/08/2018   No Known Allergies     Medication List    STOP taking these medications   aspirin EC 81 MG tablet   diltiazem 30 MG tablet Commonly known as:  CARDIZEM     TAKE these medications   acetaminophen 500 MG tablet Commonly known as:  TYLENOL Take 1,000 mg by mouth daily as needed for mild pain, moderate pain or headache.   amoxicillin 500 MG capsule Commonly known as:  AMOXIL Take 2,000 mg by mouth See admin instructions. Take 4 capsules (2000 mg) by mouth one hour prior to dental procedures   clobetasol cream 0.05 % Commonly known as:  TEMOVATE Apply 1 application topically 2 (two) times daily.   colchicine 0.6 MG tablet Take 1 tablet (0.6 mg total) by mouth daily. Start taking on:  January 09, 2018   ibuprofen 600 MG tablet Commonly known as:  ADVIL,MOTRIN Take 1 tablet (600 mg total) by mouth 3 (three) times daily with meals.   lidocaine-prilocaine cream Commonly known as:  EMLA Apply 1 application topically See admin instructions. Apply topically Monday, Wednesday and Friday before dialysis   multivitamin Tabs tablet Take 1 tablet by mouth daily.   ondansetron  8 MG tablet Commonly known as:  ZOFRAN Take 1 tablet (8 mg total) by mouth 2 (two) times daily as needed (Nausea or vomiting).   oxyCODONE-acetaminophen 5-325 MG tablet Commonly known as:  PERCOCET/ROXICET Take 1 tablet by mouth every 6 (six) hours as needed. What changed:  reasons to take this   pantoprazole 40 MG tablet Commonly known as:  PROTONIX TAKE 1 TABLET (40 MG TOTAL) BY MOUTH  DAILY. What changed:  See the new instructions.   prochlorperazine 10 MG tablet Commonly known as:  COMPAZINE Take 1 tablet (10 mg total) by mouth every 6 (six) hours as needed (Nausea or vomiting).   sevelamer carbonate 800 MG tablet Commonly known as:  RENVELA Take 800-1,600 mg by mouth See admin instructions. 1600mg  three times daily and 800mg  twice daily with snacks   simvastatin 20 MG tablet Commonly known as:  ZOCOR TAKE 1 TABLET BY MOUTH EVERYDAY AT BEDTIME What changed:  See the new instructions.      Follow-up Information    Lorretta Harp, MD Follow up.   Specialties:  Cardiology, Radiology Why:  You have a visit with Dr. Gwenlyn Found scheduled on 01/25/2018. Contact information: [address] [place] [address] [phone]        Lucianne Lei, MD. Schedule an appointment as soon as possible for a visit in 1 week(s).   Specialty:  Family Medicine Contact information: [address] [place] [address] [phone]        Nicholas Lose, MD. Schedule an appointment as soon as possible for a visit in 1 week(s).   Specialty:  Hematology and Oncology Contact information: [place] [address] [phone]          No Known Allergies  Consultations:   cardiology and nephrology   Other Procedures/Studies: Dg Chest 2 View  Result Date: 01/06/2018 CLINICAL DATA:  Chest pain radiating to back. Atrial fibrillation. End-stage renal disease on dialysis. EXAM: CHEST - 2 VIEW COMPARISON:  10/05/2017 FINDINGS: Stable cardiomegaly. Central venous access catheters remain in appropriate position. Both lungs are clear. No evidence pleural effusion. IMPRESSION: Stable cardiomegaly.  No active lung disease. Electronically Signed   By: Earle Gell M.D.   On: 01/06/2018 19:49   Ct Angio Chest Pe W/cm &/or Wo Cm  Result Date: 01/06/2018 CLINICAL DATA:  Chest pain since dialysis today. History of breast cancer. EXAM:  CT ANGIOGRAPHY CHEST WITH CONTRAST TECHNIQUE: Multidetector CT imaging of the chest was performed using the standard protocol during bolus administration of intravenous contrast. Multiplanar CT image reconstructions and MIPs were obtained to evaluate the vascular anatomy. CONTRAST:  180mL ISOVUE-370 IOPAMIDOL (ISOVUE-370) INJECTION 76% COMPARISON:  Chest radiograph January 06, 2018 and CT chest October 05, 2017 the FINDINGS: CARDIOVASCULAR: Adequate contrast opacification of the pulmonary artery's. Main pulmonary artery is not enlarged. No pulmonary arterial filling defects to the level of the subsegmental branches. The heart is moderately enlarged and unchanged. Minimal coronary artery calcifications. Trace pericardial effusion. Thoracic aorta is normal in course and caliber, mild calcific atherosclerosis. Pulmonary venous congestion. MEDIASTINUM/NODES: No lymphadenopathy by CT size criteria. RIGHT chest Port-A-Cath via internal jugular approach and tunneled LEFT subclavian dialysis catheter with distal tips RIGHT atrium. LEFT subclavian to axillary venous stent, patency could not be confirmed non angiographic phase. LUNGS/PLEURA: Tracheobronchial tree is patent, no pneumothorax. Mild bronchial wall thickening. Trace pleural effusions and dependent atelectasis. UPPER ABDOMEN: Non-acute. Small hiatal hernia. Numerous RIGHT renal cysts, incompletely imaged. LEFT kidney is  not included in field of view. Subcentimeter probable cyst LEFT lobe of the liver. MUSCULOSKELETAL: Non-acute. Status post RIGHT mastectomy with small volume crescentic nonenhancing fluid and surgical clips RIGHT chest wall. Midthoracic dextroscoliosis. Review of the MIP images confirms the above findings. IMPRESSION: 1. No acute pulmonary embolism. 2. Similar cardiomegaly with pulmonary vascular congestion. Trace pleural effusions and atelectasis. 3. Interval RIGHT mastectomy. Small seroma without image findings of abscess. Aortic  Atherosclerosis (ICD10-I70.0). Electronically Signed   By: Elon Alas M.D.   On: 01/06/2018 22:54      TODAY-DAY OF DISCHARGE:  Subjective:   Karen [name] today has no headache,no chest abdominal pain,no new weakness tingling or numbness, feels much better wants to go home today.   Objective:   Blood pressure 114/76, pulse 93, temperature 98.3 F (36.8 C), temperature source Oral, resp. rate 18, height 5\' 1"  (1.549 m), weight 86.4 kg, SpO2 96 %.  Intake/Output Summary (Last 24 hours) at 01/08/2018 1329 Last data filed at 01/07/2018 2115 Gross per 24 hour  Intake 555 ml  Output 0 ml  Net 555 ml   Filed Weights   01/06/18 1901 01/08/18 0500  Weight: 86.2 kg 86.4 kg    Exam: Awake Alert, Oriented *3, No new F.N deficits, Normal affect [place].AT,PERRAL Supple Neck,No JVD, No cervical lymphadenopathy appriciated.  Symmetrical Chest wall movement, Good air movement bilaterally, CTAB RRR,No Gallops,Rubs or new Murmurs, No Parasternal Heave +ve B.Sounds, Abd Soft, Non tender, No organomegaly appriciated, No rebound -guarding or rigidity. No Cyanosis, Clubbing or edema, No new Rash or bruise   PERTINENT RADIOLOGIC STUDIES: Dg Chest 2 View  Result Date: 01/06/2018 CLINICAL DATA:  Chest pain radiating to back. Atrial fibrillation. End-stage renal disease on dialysis. EXAM: CHEST - 2 VIEW COMPARISON:  10/05/2017 FINDINGS: Stable cardiomegaly. Central venous access catheters remain in appropriate position. Both lungs are clear. No evidence pleural effusion. IMPRESSION: Stable cardiomegaly.  No active lung disease. Electronically Signed   By: Earle Gell M.D.   On: 01/06/2018 19:49   Ct Angio Chest Pe W/cm &/or Wo Cm  Result Date: 01/06/2018 CLINICAL DATA:  Chest pain since dialysis today. History of breast cancer. EXAM: CT ANGIOGRAPHY CHEST WITH CONTRAST TECHNIQUE: Multidetector CT imaging of the chest was performed using the standard protocol during bolus administration of  intravenous contrast. Multiplanar CT image reconstructions and MIPs were obtained to evaluate the vascular anatomy. CONTRAST:  160mL ISOVUE-370 IOPAMIDOL (ISOVUE-370) INJECTION 76% COMPARISON:  Chest radiograph January 06, 2018 and CT chest October 05, 2017 the FINDINGS: CARDIOVASCULAR: Adequate contrast opacification of the pulmonary artery's. Main pulmonary artery is not enlarged. No pulmonary arterial filling defects to the level of the subsegmental branches. The heart is moderately enlarged and unchanged. Minimal coronary artery calcifications. Trace pericardial effusion. Thoracic aorta is normal in course and caliber, mild calcific atherosclerosis. Pulmonary venous congestion. MEDIASTINUM/NODES: No lymphadenopathy by CT size criteria. RIGHT chest Port-A-Cath via internal jugular approach and tunneled LEFT subclavian dialysis catheter with distal tips RIGHT atrium. LEFT subclavian to axillary venous stent, patency could not be confirmed non angiographic phase. LUNGS/PLEURA: Tracheobronchial tree is patent, no pneumothorax. Mild bronchial wall thickening. Trace pleural effusions and dependent atelectasis. UPPER ABDOMEN: Non-acute. Small hiatal hernia. Numerous RIGHT renal cysts, incompletely imaged. LEFT kidney is not included in field of view. Subcentimeter probable cyst LEFT lobe of the liver. MUSCULOSKELETAL: Non-acute. Status post RIGHT mastectomy with small volume crescentic nonenhancing fluid and surgical clips RIGHT chest wall. Midthoracic dextroscoliosis. Review of the MIP images confirms the above findings.  IMPRESSION: 1. No acute pulmonary embolism. 2. Similar cardiomegaly with pulmonary vascular congestion. Trace pleural effusions and atelectasis. 3. Interval RIGHT mastectomy. Small seroma without image findings of abscess. Aortic Atherosclerosis (ICD10-I70.0). Electronically Signed   By: Elon Alas M.D.   On: 01/06/2018 22:54     PERTINENT LAB RESULTS: CBC: Recent Labs     01/07/18 1959 01/08/18 0410  WBC 4.9 6.4  HGB 8.0* 9.4*  HCT 25.5* 29.4*  PLT 195 247   CMET CMP     Component Value Date/Time   NA 140 01/08/2018 0410   K 4.0 01/08/2018 0410   CL 96 (L) 01/08/2018 0410   CO2 29 01/08/2018 0410   GLUCOSE 125 (H) 01/08/2018 0410   BUN 38 (H) 01/08/2018 0410   CREATININE 8.68 (H) 01/08/2018 0410   CREATININE 6.56 (HH) 11/27/2017 0808   CREATININE 5.71 (H) 02/27/2014 0900   CALCIUM 8.0 (L) 01/08/2018 0410   CALCIUM 9.0 01/28/2014 1454   PROT 6.4 (L) 01/06/2018 2010   ALBUMIN 3.3 (L) 01/08/2018 0410   AST 18 01/06/2018 2010   AST 17 11/27/2017 0808   ALT <5 01/06/2018 2010   ALT <6 11/27/2017 0808   ALKPHOS 113 01/06/2018 2010   BILITOT 0.5 01/06/2018 2010   BILITOT 0.4 11/27/2017 0808   GFRNONAA 4 (L) 01/08/2018 0410   GFRNONAA 6 (L) 11/27/2017 0808   GFRAA 5 (L) 01/08/2018 0410   GFRAA 7 (L) 11/27/2017 0808    GFR Estimated Creatinine Clearance: 6.7 mL/min (A) (by C-G formula based on SCr of 8.68 mg/dL (H)). No results for input(s): LIPASE, AMYLASE in the last 72 hours. Recent Labs    01/07/18 0107 01/07/18 0627 01/07/18 1243  TROPONINI <0.03 <0.03 <0.03   Invalid input(s): POCBNP Recent Labs    01/06/18 2010  DDIMER 3.98*   No results for input(s): HGBA1C in the last 72 hours. No results for input(s): CHOL, HDL, LDLCALC, TRIG, CHOLHDL, LDLDIRECT in the last 72 hours. No results for input(s): TSH, T4TOTAL, T3FREE, THYROIDAB in the last 72 hours.  Invalid input(s): FREET3 No results for input(s): VITAMINB12, FOLATE, FERRITIN, TIBC, IRON, RETICCTPCT in the last 72 hours. Coags: No results for input(s): INR in the last 72 hours.  Invalid input(s): PT Microbiology: Recent Results (from the past 240 hour(s))  Culture, blood (routine x 2)     Status: None (Preliminary result)   Collection Time: 01/07/18  1:08 AM  Result Value Ref Range Status   Specimen Description BLOOD LEFT ARM  Final   Special Requests   Final     BOTTLES DRAWN AEROBIC AND ANAEROBIC Blood Culture results may not be optimal due to an excessive volume of blood received in culture bottles   Culture   Final    NO GROWTH 1 DAY Performed at [facility], [place] [address], [place], [place] [address]    Report Status PENDING  Incomplete  Culture, blood (routine x 2)     Status: None (Preliminary result)   Collection Time: 01/07/18  1:10 AM  Result Value Ref Range Status   Specimen Description BLOOD LEFT HAND  Final   Special Requests   Final    BOTTLES DRAWN AEROBIC ONLY Blood Culture results may not be optimal due to an excessive volume of blood received in culture bottles   Culture   Final    NO GROWTH 1 DAY Performed at [facility], [place] [address], [place], [place] [address]    Report Status PENDING  Incomplete  MRSA PCR Screening     Status: None   Collection Time: 01/07/18  5:01 AM  Result Value Ref Range Status   MRSA by PCR NEGATIVE NEGATIVE Final    Comment:        The GeneXpert MRSA Assay (FDA approved for NASAL specimens only), is one component of a comprehensive MRSA colonization surveillance program. It is not intended to diagnose MRSA infection nor to guide or monitor treatment for MRSA infections. Performed at [facility], [place] [address], [place], [place] [address]     FURTHER DISCHARGE INSTRUCTIONS:  Get Medicines reviewed and adjusted: Please take all your medications with you for your next visit with your Primary MD  Laboratory/radiological data: Please request your Primary MD to go over all hospital tests and procedure/radiological results at the follow up, please ask your Primary MD to get all Hospital records sent to his/her office.  In some cases, they will be blood work, cultures and biopsy results pending at the time of your discharge. Please request that your primary care M.D. goes through all the records of your hospital data and follows up on these results.  Also Note the  following: If you experience worsening of your admission symptoms, develop shortness of breath, life threatening emergency, suicidal or homicidal thoughts you must seek medical attention immediately by calling 911 or calling your MD immediately  if symptoms less severe.  You must read complete instructions/literature along with all the possible adverse reactions/side effects for all the Medicines you take and that have been prescribed to you. Take any new Medicines after you have completely understood and accpet all the possible adverse reactions/side effects.   Do not drive when taking Pain medications or sleeping medications (Benzodaizepines)  Do not take more than prescribed Pain, Sleep and Anxiety Medications. It is not advisable to combine anxiety,sleep and pain medications without talking with your primary care practitioner  Special Instructions: If you have smoked or chewed Tobacco  in the last 2 yrs please stop smoking, stop any regular Alcohol  and or any Recreational drug use.  Wear Seat belts while driving.  Please note: You were cared for by a hospitalist during your hospital stay. Once you are discharged, your primary care physician will handle any further medical issues. Please note that NO REFILLS for any discharge medications will be authorized once you are discharged, as it is imperative that you return to your primary care physician (or establish a relationship with a primary care physician if you do not have one) for your post hospital discharge needs so that they can reassess your need for medications and monitor your lab values.  Total Time spent coordinating discharge including counseling, education and face to face time equals 35 minutes.  SignedOren Binet 01/08/2018 1:29 PM

## 2018-01-08 NOTE — Procedures (Signed)
I was present at this dialysis session. I have reviewed the session itself and made appropriate changes.   Vital signs in last 24 hours:  Temp:  [97.8 F (36.6 C)-99.7 F (37.6 C)] 98.5 F (36.9 C) (12/24 0723) Pulse Rate:  [70-96] 96 (12/24 0830) Resp:  [18-20] 18 (12/24 0723) BP: (75-113)/(42-71) 113/66 (12/24 0830) SpO2:  [95 %-100 %] 98 % (12/24 0723) Weight:  [86.4 kg] 86.4 kg (12/24 0500) Weight change: 0.22 kg Filed Weights   01/06/18 1901 01/08/18 0500  Weight: 86.2 kg 86.4 kg    Recent Labs  Lab 01/08/18 0410  NA 140  K 4.0  CL 96*  CO2 29  GLUCOSE 125*  BUN 38*  CREATININE 8.68*  CALCIUM 8.0*  PHOS 3.5    Recent Labs  Lab 01/06/18 2010 01/07/18 0107 01/07/18 1959 01/08/18 0410  WBC 5.9 6.1 4.9 6.4  NEUTROABS 4.4  --   --   --   HGB 7.4* 7.0* 8.0* 9.4*  HCT 23.5* 22.5* 25.5* 29.4*  MCV 103.1* 103.7* 98.5 97.7  PLT 186 179 195 247    Scheduled Meds: . sodium chloride   Intravenous Once  . aspirin EC  81 mg Oral Daily  . atorvastatin  10 mg Oral QHS  . colchicine  0.6 mg Oral Daily  . ibuprofen  600 mg Oral TID WC  . midodrine  5 mg Oral TID WC  . multivitamin  1 tablet Oral QHS  . pantoprazole  40 mg Oral Daily  . sevelamer carbonate  1,600 mg Oral TID WC   Continuous Infusions: PRN Meds:.acetaminophen, morphine injection, nitroGLYCERIN, ondansetron (ZOFRAN) IV, oxyCODONE-acetaminophen, sevelamer carbonate, sodium chloride flush    Assessment/Plan: 1. Chest pain- felt to be pericarditis and started on low dose colchicine and NSAIDs 2. Fever- likely due to acute pericarditis 3. Breast cancer- due for chemo treatment Thurs but will likely need to be rescheduled 4. Anemia- improving 5. A fib on Dilt 6. ESRD- on holiday schedule, next HD on Friday. 7. Disposition- likely discharge to home today after HD. Donetta Potts,  MD 01/08/2018, 8:57 AM

## 2018-01-08 NOTE — Progress Notes (Signed)
Patient called because she stated that she was feeling short of breath. Upon assessment patient stated that she felt short of breath because of her oxygen. Patient removed oxygen off.  Spoke to MD and requested for patient to walk in her room.    01/08/18 1231  Vitals  Temp 98.6 F (37 C)  Temp Source Oral  BP (!) 79/45  MAP (mmHg) (!) 56  BP Location Left Arm  BP Method Automatic  Patient Position (if appropriate) Lying  Pulse Rate 91  Resp 18  Oxygen Therapy  SpO2 100 %  O2 Device Nasal Cannula

## 2018-01-08 NOTE — Progress Notes (Addendum)
[facility] will sign off.    Medication Recommendations:  Treatment of pericarditis per primary team and Nephrology given ESRD.   Other recommendations (labs, testing, etc):  Troponin has been negative. Echocardiogram showed normal systolic function with no wall motion abnormalities and no pericardial effusion. No further labs/testing recommended at this time.   Follow up as an outpatient: Patient already has outpatient visit with Dr. Gwenlyn Found scheduled for 01/25/2018 at 9:00am. Will follow-up with patient at that time.

## 2018-01-08 NOTE — Telephone Encounter (Signed)
Returned patient's call.  Patient currently admitted into hospital.  Per patient dx with pericarditis.   Hospital physicians recommendation to hold Herceptin d/t diagnosis.  Patient would like appointments cancelled for 01/10/2018.  Will forward to provider for additional recommendations.  Patient voiced agreement and understanding.

## 2018-01-08 NOTE — Progress Notes (Signed)
[place] from Infectious prevention called to discontinue Droplet precautions.

## 2018-01-09 LAB — HEPATITIS B SURFACE ANTIGEN: Hepatitis B Surface Ag: NEGATIVE

## 2018-01-09 LAB — HEPATITIS B CORE ANTIBODY, IGM: HEP B C IGM: NEGATIVE

## 2018-01-09 LAB — HEPATITIS B SURFACE ANTIBODY,QUALITATIVE: Hep B S Ab: NONREACTIVE

## 2018-01-10 ENCOUNTER — Telehealth: Payer: Self-pay

## 2018-01-10 ENCOUNTER — Inpatient Hospital Stay: Payer: Medicare Other

## 2018-01-10 ENCOUNTER — Telehealth: Payer: Self-pay | Admitting: Cardiovascular Disease

## 2018-01-10 ENCOUNTER — Inpatient Hospital Stay: Payer: Medicare Other | Admitting: Hematology and Oncology

## 2018-01-10 NOTE — Assessment & Plan Note (Signed)
06/19/2017: Right breast skin thickening and heaviness: Screening mammogram detected 2 cm of microcalcifications in the right breast in the posterior third UOQ.? 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  0/1 lymph node negative ER: 0%, negative, PR: 0%, negative, Her2: Positive, ratio 8.69. Ki-67: 30% ---------------------------------------------------------------------------------------------------------------------------------------------- Current treatment: Herceptin maintenance every 3 weeks Hospitalization: 01/06/2018 - 01/08/2018: Pericarditis unrelated to treatment: Follows up with cardiology. We will continue with Herceptin maintenance as scheduled. 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  Return to clinic every 3 weeks for Herceptin every 6 weeks for follow-up with me.

## 2018-01-10 NOTE — Telephone Encounter (Signed)
Returned call to patient, patient states she was discharged from hospital on colchicine and ibuprofen for pericarditis.  The pharmacist told her there was an interaction with simvastatin and colchicine.  Per pharmD-can increase muscle aches.  Patient states she does not want any more aches or pains so she will hold her simvastatin until OV with Dr. Gwenlyn Found 1/10.

## 2018-01-10 NOTE — Telephone Encounter (Signed)
Reviewed previous TC with Dr. Lindi Adie.  Pt does not want to proceed with Herceptin as scheduled today (01/10/2018).  Dr. Lindi Adie okay with cancelling treatment today, however would like patient to follow up with him prior to next infusion on 01/29/2018 d/t hospitalization.  Nurse notified patient.  Patient voiced understanding.  Patient scheduled for follow up prior to infusion on 01/29/2018.

## 2018-01-10 NOTE — Telephone Encounter (Signed)
  Pt c/o medication issue:  1. Name of Medication: colchicine 0.6 MG tablet and ibuprofen 600 mg  2. How are you currently taking this medication (dosage and times per day)?  Take 1 tablet (0.6 mg total) by mouth daily. Ibuprofen 1 tablet 3 x daily  3. Are you having a reaction (difficulty breathing--STAT)? No  4. What is your medication issue? Pharmacist told patient to call to make sure it is ok for her to take these medications prescribed by the ER with her other medications

## 2018-01-12 LAB — CULTURE, BLOOD (ROUTINE X 2)
CULTURE: NO GROWTH
Culture: NO GROWTH

## 2018-01-23 NOTE — Progress Notes (Signed)
Patient Care Team: Lucianne Lei, MD as PCP - General (Family Medicine) Lorretta Harp, MD as PCP - Cardiology (Cardiology) Nicholas Lose, MD as PCP - Hematology/Oncology (Hematology and Oncology) Donato Heinz, MD (Nephrology) Lorretta Harp, MD as Consulting Physician (Cardiology) Pyrtle, Lajuan Lines, MD as Consulting Physician (Gastroenterology) [facility]  DIAGNOSIS:    ICD-10-CM   1. Malignant neoplasm of upper-outer quadrant of right breast in female, estrogen receptor negative ([place]) C50.411 Sample to Blood Bank   Z17.1     SUMMARY OF ONCOLOGIC HISTORY:   Malignant neoplasm of upper-outer quadrant of right breast in female, estrogen receptor negative ([place])   06/19/2017 Initial Diagnosis    Right breast skin thickening and heaviness: Screening mammogram detected 2 cm of microcalcifications in the right breast in the posterior third UOQ.?  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  0/1 lymph node negative ER: 0%, negative, PR: 0%, negative, Her2: Positive, ratio 8.69. Ki-67: 30%     CHIEF COMPLIANT: Follow-up on Herceptin maintenance  INTERVAL HISTORY: Karen [name] is a 63 y.o. with above-mentioned history of right breast cancer treated with neoadjuvant chemotherapy with Taxol, Herceptin and recent right mastectomy. Her most recent ECHO on 01/08/19 showed an ejection fraction in the range of 55-60%. She recently presented to the ED for chest  pain and was diagnosed with pericarditis and put on colchicine. She is currently on maintenance Herceptin with plan to complete in 07/2018. She missed her last treatment because her dialysis days got changed to her treatment day. She presents to the clinic today alone. Chest pain is occasional, often in the morning, and is a 2-3 on a scale of 10. She has received iron for the past 2 weeks while on dialysis due to low Hg levels. She reviewed her medication list with me.   REVIEW OF SYSTEMS:   Constitutional: Denies fevers, chills or abnormal weight loss Eyes: Denies blurriness of vision Ears, nose, mouth, throat, and face: Denies mucositis or sore throat Respiratory: Denies cough, dyspnea or wheezes Cardiovascular: Denies palpitation (+) occasional chest pain Gastrointestinal:  Denies nausea, heartburn or change in bowel habits Skin: Denies abnormal skin rashes Lymphatics: Denies new lymphadenopathy or easy bruising Neurological:Denies numbness, tingling or new weaknesses Behavioral/Psych: Mood is stable, no new changes  Extremities: No lower extremity edema Breast: denies any pain or lumps or nodules in either breasts All other systems were reviewed with the patient and are negative.  I have reviewed the past medical history, past surgical history, social history and family history with the patient and they are unchanged from previous note.  ALLERGIES:  has No Known Allergies.  MEDICATIONS:  Current Outpatient Medications  Medication Sig Dispense Refill  . acetaminophen (TYLENOL) 500 MG tablet Take 1,000 mg by mouth daily as needed for mild pain, moderate pain or headache.     Marland Kitchen amoxicillin (AMOXIL) 500 MG capsule Take 2,000 mg by mouth See admin instructions. Take 4 capsules (2000 mg) by mouth  one hour prior to dental procedures    . clobetasol cream (TEMOVATE) 6.72 % Apply 1 application topically 2 (two) times daily.    . colchicine 0.6 MG tablet Take 1 tablet (0.6 mg total) by mouth  daily. 41 tablet 0  . lidocaine-prilocaine (EMLA) cream Apply 1 application topically See admin instructions. Apply topically Monday, Wednesday and Friday before dialysis  6  . multivitamin (RENA-VIT) TABS tablet Take 1 tablet by mouth daily.    . pantoprazole (PROTONIX) 40 MG tablet TAKE 1 TABLET (40 MG TOTAL) BY MOUTH DAILY. (Patient taking differently: Take 40 mg by mouth daily. ) 90 tablet 1  . sevelamer carbonate (RENVELA) 800 MG tablet Take 800-1,600 mg by mouth See admin instructions. '1600mg'$  three times daily and '800mg'$  twice daily with snacks    . simvastatin (ZOCOR) 20 MG tablet TAKE 1 TABLET BY MOUTH EVERYDAY AT BEDTIME (Patient taking differently: Take 20 mg by mouth at bedtime. ) 90 tablet 1   No current facility-administered medications for this visit.     PHYSICAL EXAMINATION: ECOG PERFORMANCE STATUS: 1 - Symptomatic but completely ambulatory  Vitals:   01/29/18 0801  BP: 132/75  Pulse: 86  Resp: 17  Temp: 98.6 F (37 C)  SpO2: 100%   Filed Weights   01/29/18 0801  Weight: 191 lb 11.2 oz (87 kg)    GENERAL:alert, no distress and comfortable SKIN: skin color, texture, turgor are normal, no rashes or significant lesions EYES: normal, Conjunctiva are pink and non-injected, sclera clear OROPHARYNX:no exudate, no erythema and lips, buccal mucosa, and tongue normal  NECK: supple, thyroid normal size, non-tender, without nodularity LYMPH:  no palpable lymphadenopathy in the cervical, axillary or inguinal LUNGS: clear to auscultation and percussion with normal breathing effort HEART: regular rate & rhythm and no murmurs and no lower extremity edema ABDOMEN:abdomen soft, non-tender and normal bowel sounds MUSCULOSKELETAL:no cyanosis of digits and no clubbing  NEURO: alert & oriented x 3 with fluent speech, no focal motor/sensory deficits EXTREMITIES: No lower extremity edema  LABORATORY DATA:  I have reviewed the data as listed CMP Latest Ref Rng & Units 01/08/2018  01/07/2018 01/06/2018  Glucose 70 - 99 mg/dL 125(H) - 125(H)  BUN 8 - 23 mg/dL 38(H) - 11  Creatinine 0.44 - 1.00 mg/dL 8.68(H) 4.99(H) 4.01(H)  Sodium 135 - 145 mmol/L 140 - 141  Potassium 3.5 - 5.1 mmol/L 4.0 - 3.5  Chloride 98 - 111 mmol/L 96(L) - 98  CO2 22 - 32 mmol/L 29 - 30  Calcium 8.9 - 10.3 mg/dL 8.0(L) - 7.9(L)  Total Protein 6.5 - 8.1 g/dL - - 6.4(L)  Total Bilirubin 0.3 - 1.2 mg/dL - - 0.5  Alkaline Phos 38 - 126 U/L - - 113  AST 15 - 41 U/L - - 18  ALT 0 - 44 U/L - - <5    Lab Results  Component Value Date   WBC 6.4 01/08/2018   HGB 9.4 (L) 01/08/2018   HCT 29.4 (L) 01/08/2018   MCV 97.7 01/08/2018   PLT 247 01/08/2018   NEUTROABS 4.4 01/06/2018    ASSESSMENT & PLAN:  Malignant neoplasm of upper-outer quadrant of right breast in female, estrogen receptor negative ([place]) 06/19/2017: Right breast skin thickening and heaviness: Screening mammogram detected 2 cm of microcalcifications in the right breast in the posterior third UOQ.? 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  0/1 lymph node negative ER: 0%, negative, PR: 0%, negative, Her2: Positive, ratio 8.69. Ki-67: 30% ---------------------------------------------------------------------------------------------------------------------------------------------- Current treatment: Herceptin maintenance every 3 weeks Hospitalization: 01/06/2018 - 01/08/2018: Pericarditis unrelated to treatment: Follows up with cardiology.  Recurrent severe anemia: We will check blood work today and a type and cross if blood transfusion is needed.  Patient has been getting IV iron at her nephrologist office.  We will resume Herceptin maintenance as scheduled for today.  End-stage kidney disease on  hemodialysis.  Return to clinic every 3 weeks for Herceptin every 6 weeks for follow-up with me.   Orders Placed This Encounter  Procedures  . Sample to Blood Bank    Standing Status:   Future    Standing Expiration Date:   01/30/2019   The patient has a good understanding of the overall plan. she agrees with it. she will call with any problems that may develop before the next visit here.  Nicholas Lose, MD 01/29/2018  Julious Oka Dorshimer am acting as scribe for Dr. Nicholas Lose.  I have reviewed the above documentation for accuracy and completeness, and I agree with the above.

## 2018-01-24 ENCOUNTER — Other Ambulatory Visit (HOSPITAL_COMMUNITY): Payer: Medicare Other

## 2018-01-25 ENCOUNTER — Ambulatory Visit (INDEPENDENT_AMBULATORY_CARE_PROVIDER_SITE_OTHER): Payer: Medicare Other | Admitting: Cardiovascular Disease

## 2018-01-25 ENCOUNTER — Encounter: Payer: Self-pay | Admitting: Cardiovascular Disease

## 2018-01-25 DIAGNOSIS — I480 Paroxysmal atrial fibrillation: Secondary | ICD-10-CM | POA: Diagnosis not present

## 2018-01-25 DIAGNOSIS — I300 Acute nonspecific idiopathic pericarditis: Secondary | ICD-10-CM

## 2018-01-25 NOTE — Assessment & Plan Note (Signed)
Karen [name] had an admission on 01/07/2018 with chest pain thought to be acute idiopathic pericarditis.  Her 2D echo was unremarkable.  Her d-dimer was mildly elevated but a CTA was negative for PE.  She was begun on ibuprofen and colchicine after being seen by Dr. Algis Greenhouse.  Troponins were were negative.  Her chest pain has significantly improved.

## 2018-01-25 NOTE — Assessment & Plan Note (Signed)
History of paroxysmal atrial fib maintaining sinus rhythm not on oral anticoagulation.  Held her diltiazem was recently discontinued because of hypotension.

## 2018-01-25 NOTE — Patient Instructions (Addendum)
Medication Instructions:  Complete your Colchicine regimen in 6 weeks Complete your Ibuprofen regimen in 2 weeks  If you need a refill on your cardiac medications before your next appointment, please call your pharmacy.   Lab work: NONE If you have labs (blood work) drawn today and your tests are completely normal, you will receive your results only by: Marland Kitchen MyChart Message (if you have MyChart) OR . A paper copy in the mail If you have any lab test that is abnormal or we need to change your treatment, we will call you to review the results.  Testing/Procedures: NONE  Follow-Up: At [facility], you and your health needs are our priority.  As part of our continuing mission to provide you with exceptional heart care, we have created designated Provider Care Teams.  These Care Teams include your primary Cardiologist (physician) and Advanced Practice Providers (APPs -  Physician Assistants and Nurse Practitioners) who all work together to provide you with the care you need, when you need it. . You will need a follow up appointment in 6 months with an APP and 12 months with Dr. Gwenlyn Found.  Please call our office 2 months in advance to schedule each appointment.  You may see Dr. Gwenlyn Found or one of the following Advanced Practice Providers on your designated Care Team:   . Kerin Ransom, [place] . Almyra Deforest, PA-C . Fabian Sharp, PA-C . Jory Sims, DNP . Rosaria Ferries, PA-C . Roby Lofts, PA-C . Sande Rives, PA-C

## 2018-01-25 NOTE — Progress Notes (Signed)
01/25/2018 Oleh Genin   [birth year]  [id number]  Primary Physician Lucianne Lei, MD Primary Cardiologist: Lorretta Harp MD Lupe Carney, [place]  HPI:  Karen [name] is a 63 y.o.  married African American female mother of 2, grandmother to 2 grandchildren who currently works as an Sales executive. I saw her 6 years ago in our office for evaluation of a fast heart rate which was worked up and ultimately resolved. I last saw her in the office  02/15/2016.Her cardiac risk factor profile is notable for hypertension but otherwise are negative. For the last 3 weeks prior to her last office visit she had noticed substernal chest pain radiating to both upper extremities occurring on a daily basis. She does have reflux but she says the symptoms are different. I ordered a Myoview stress test which was entirely normal. Since I saw her in the office a year ago her chest pain has resolved. She has gone on hemodialysis one year ago. 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 She did have paroxysmal atrial fibrillation demonstrated in the emergency room after dialysis converting with IV diltiazem.   She was admitted to [facility] 01/07/2018 with atypical chest pain.  She ruled out for myocardial infarction.  Her d-dimer was mildly elevated and a CTA was negative for PE.  She was seen by Dr. 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  He did hear a rub.  Her diltiazem was discontinued as was her aspirin.  Current Meds  Medication Sig  . acetaminophen (TYLENOL) 500 MG tablet Take 1,000 mg by mouth daily as needed for mild pain, moderate pain or headache.   Marland Kitchen amoxicillin (AMOXIL) 500 MG capsule Take 2,000 mg by mouth See admin instructions. Take 4 capsules (2000 mg) by mouth one hour prior to dental procedures  . clobetasol cream (TEMOVATE) 9.62 % Apply 1 application topically 2 (two) times daily.  . colchicine 0.6 MG tablet Take 1  tablet (0.6 mg total) by mouth daily.  Marland Kitchen ibuprofen (ADVIL,MOTRIN) 600 MG tablet Take 1 tablet (600 mg total) by mouth 3 (three) times daily with meals.  . lidocaine-prilocaine (EMLA) cream Apply 1 application topically See admin instructions. Apply topically Monday, Wednesday and Friday before dialysis  . multivitamin (RENA-VIT) TABS tablet Take 1 tablet by mouth daily.  . ondansetron (ZOFRAN) 8 MG tablet Take 1 tablet (8 mg total) by mouth 2 (two) times daily as needed (Nausea or vomiting).  . pantoprazole (PROTONIX) 40 MG tablet TAKE 1 TABLET (40 MG TOTAL) BY MOUTH DAILY. (Patient taking differently: Take 40 mg by mouth daily. )  . prochlorperazine (COMPAZINE) 10 MG tablet Take 1 tablet (10 mg total) by mouth every 6 (six) hours as needed (Nausea or vomiting).  . sevelamer carbonate (RENVELA) 800 MG tablet Take 800-1,600 mg by mouth See admin instructions. 1600mg  three times daily and 800mg  twice daily with snacks  . simvastatin (ZOCOR) 20 MG tablet TAKE 1 TABLET BY MOUTH EVERYDAY AT BEDTIME (Patient taking differently: Take 20 mg by mouth at bedtime. )     No Known Allergies  Social History   Socioeconomic History  . Marital status: Married    Spouse name: Not on file  . Number of children: 2  . Years of education: Not on file  . Highest education level: Not on file  Occupational History  . Occupation: [employer] OFFICE    Employer: [employer]  Social Needs  . Emergency planning/management officer  strain: Not on file  . Food insecurity:    Worry: Not on file    Inability: Not on file  . Transportation needs:    Medical: Not on file    Non-medical: Not on file  Tobacco Use  . Smoking status: Never Smoker  . Smokeless tobacco: Never Used  Substance and Sexual Activity  . Alcohol use: No  . Drug use: No  . Sexual activity: Not on file    Comment: Hysterectomy  Lifestyle  . Physical activity:    Days per week: Not on file    Minutes per session: Not on file  . Stress: Not on file    Relationships  . Social connections:    Talks on phone: Not on file    Gets together: Not on file    Attends religious service: Not on file    Active member of club or organization: Not on file    Attends meetings of clubs or organizations: Not on file    Relationship status: Not on file  . Intimate partner violence:    Fear of current or ex partner: Not on file    Emotionally abused: Not on file    Physically abused: Not on file    Forced sexual activity: Not on file  Other Topics Concern  . Not on file  Social History Narrative  . Not on file     Review of Systems: General: negative for chills, fever, night sweats or weight changes.  Cardiovascular: negative for chest pain, dyspnea on exertion, edema, orthopnea, palpitations, paroxysmal nocturnal dyspnea or shortness of breath Dermatological: negative for rash Respiratory: negative for cough or wheezing Urologic: negative for hematuria Abdominal: negative for nausea, vomiting, diarrhea, bright red blood per rectum, melena, or hematemesis Neurologic: negative for visual changes, syncope, or dizziness All other systems reviewed and are otherwise negative except as noted above.    Blood pressure (!) 141/79, pulse 88, height 5\' 1"  (1.549 m), weight 193 lb 3.2 oz (87.6 kg), SpO2 100 %.  General appearance: alert and no distress Neck: no adenopathy, no carotid bruit, no JVD, supple, symmetrical, trachea midline and thyroid not enlarged, symmetric, no tenderness/mass/nodules Lungs: clear to auscultation bilaterally Heart: regular rate and rhythm, S1, S2 normal, no murmur, click, rub or gallop Extremities: extremities normal, atraumatic, no cyanosis or edema Pulses: 2+ and symmetric Skin: Skin color, texture, turgor normal. No rashes or lesions Neurologic: Alert and oriented X 3, normal strength and tone. Normal symmetric reflexes. Normal coordination and gait  EKG performed today  ASSESSMENT AND PLAN:   Paroxysmal atrial  fibrillation (HCC) History of paroxysmal atrial fib maintaining sinus rhythm not on oral anticoagulation.  Held her diltiazem was recently discontinued because of hypotension.  Acute idiopathic pericarditis Ms. Poinsett had an admission on 01/07/2018 with chest pain thought to be acute idiopathic pericarditis.  Her 2D echo was unremarkable.  Her d-dimer was mildly elevated but a CTA was negative for PE.  She was begun on ibuprofen and colchicine after being seen by Dr. Algis Greenhouse.  Troponins were were negative.  Her chest pain has significantly improved.      Lorretta Harp MD FACP,FACC,FAHA, [facility] 01/25/2018 9:39 AM

## 2018-01-29 ENCOUNTER — Inpatient Hospital Stay (HOSPITAL_BASED_OUTPATIENT_CLINIC_OR_DEPARTMENT_OTHER): Payer: Medicare Other | Admitting: Hematology and Oncology

## 2018-01-29 ENCOUNTER — Inpatient Hospital Stay: Payer: Medicare Other | Attending: Hematology and Oncology

## 2018-01-29 DIAGNOSIS — Z171 Estrogen receptor negative status [ER-]: Secondary | ICD-10-CM | POA: Diagnosis not present

## 2018-01-29 DIAGNOSIS — Z992 Dependence on renal dialysis: Secondary | ICD-10-CM | POA: Diagnosis not present

## 2018-01-29 DIAGNOSIS — Z5112 Encounter for antineoplastic immunotherapy: Secondary | ICD-10-CM | POA: Insufficient documentation

## 2018-01-29 DIAGNOSIS — N186 End stage renal disease: Secondary | ICD-10-CM | POA: Diagnosis not present

## 2018-01-29 DIAGNOSIS — C50411 Malignant neoplasm of upper-outer quadrant of right female breast: Secondary | ICD-10-CM

## 2018-01-29 DIAGNOSIS — Z9011 Acquired absence of right breast and nipple: Secondary | ICD-10-CM | POA: Insufficient documentation

## 2018-01-29 DIAGNOSIS — I319 Disease of pericardium, unspecified: Secondary | ICD-10-CM | POA: Insufficient documentation

## 2018-01-29 DIAGNOSIS — Z9221 Personal history of antineoplastic chemotherapy: Secondary | ICD-10-CM | POA: Insufficient documentation

## 2018-01-29 DIAGNOSIS — D649 Anemia, unspecified: Secondary | ICD-10-CM | POA: Diagnosis not present

## 2018-01-29 MED ORDER — HEPARIN SOD (PORK) LOCK FLUSH 100 UNIT/ML IV SOLN
500.0000 [IU] | Freq: Once | INTRAVENOUS | Status: AC | PRN
  Administered 2018-01-29: 500 [IU]
  Filled 2018-01-29: qty 5

## 2018-01-29 MED ORDER — DIPHENHYDRAMINE HCL 25 MG PO CAPS
ORAL_CAPSULE | ORAL | Status: AC
  Filled 2018-01-29: qty 2

## 2018-01-29 MED ORDER — TRASTUZUMAB CHEMO 150 MG IV SOLR
6.0000 mg/kg | Freq: Once | INTRAVENOUS | Status: AC
  Administered 2018-01-29: 525 mg via INTRAVENOUS
  Filled 2018-01-29: qty 25

## 2018-01-29 MED ORDER — SODIUM CHLORIDE 0.9 % IV SOLN
Freq: Once | INTRAVENOUS | Status: AC
  Administered 2018-01-29: 10:00:00 via INTRAVENOUS
  Filled 2018-01-29: qty 250

## 2018-01-29 MED ORDER — SODIUM CHLORIDE 0.9% FLUSH
10.0000 mL | INTRAVENOUS | Status: DC | PRN
  Administered 2018-01-29: 10 mL
  Filled 2018-01-29: qty 10

## 2018-01-29 MED ORDER — DIPHENHYDRAMINE HCL 25 MG PO CAPS
50.0000 mg | ORAL_CAPSULE | Freq: Once | ORAL | Status: AC
  Administered 2018-01-29: 50 mg via ORAL

## 2018-01-29 MED ORDER — ACETAMINOPHEN 325 MG PO TABS
ORAL_TABLET | ORAL | Status: AC
  Filled 2018-01-29: qty 2

## 2018-01-29 MED ORDER — ACETAMINOPHEN 325 MG PO TABS
650.0000 mg | ORAL_TABLET | Freq: Once | ORAL | Status: AC
  Administered 2018-01-29: 650 mg via ORAL

## 2018-01-29 NOTE — Patient Instructions (Signed)
[facility] Discharge Instructions for Patients Receiving Chemotherapy  Today you received the following chemotherapy agents Herceptin  To help prevent nausea and vomiting after your treatment, we encourage you to take your nausea medication as directed   If you develop nausea and vomiting that is not controlled by your nausea medication, call the clinic.   BELOW ARE SYMPTOMS THAT SHOULD BE REPORTED IMMEDIATELY:  *FEVER GREATER THAN 100.5 F  *CHILLS WITH OR WITHOUT FEVER  NAUSEA AND VOMITING THAT IS NOT CONTROLLED WITH YOUR NAUSEA MEDICATION  *UNUSUAL SHORTNESS OF BREATH  *UNUSUAL BRUISING OR BLEEDING  TENDERNESS IN MOUTH AND THROAT WITH OR WITHOUT PRESENCE OF ULCERS  *URINARY PROBLEMS  *BOWEL PROBLEMS  UNUSUAL RASH Items with * indicate a potential emergency and should be followed up as soon as possible.  Feel free to call the clinic should you have any questions or concerns. The clinic phone number is [phone].  Please show the CHEMO ALERT CARD at check-in to the Emergency Department and triage nurse.   

## 2018-01-29 NOTE — Assessment & Plan Note (Addendum)
06/19/2017: Right breast skin thickening and heaviness: Screening mammogram detected 2 cm of microcalcifications in the right breast in the posterior third UOQ.? 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  0/1 lymph node negative ER: 0%, negative, PR: 0%, negative, Her2: Positive, ratio 8.69. Ki-67: 30% ---------------------------------------------------------------------------------------------------------------------------------------------- Current treatment: Herceptin maintenance every 3 weeks Hospitalization: 01/06/2018 - 01/08/2018: Pericarditis unrelated to treatment: Follows up with cardiology.  We will resume Herceptin maintenance as scheduled for today.  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  Return to clinic every 3 weeks for Herceptin every 6 weeks for follow-up with me.

## 2018-02-04 ENCOUNTER — Telehealth: Payer: Self-pay | Admitting: Cardiovascular Disease

## 2018-02-04 NOTE — Telephone Encounter (Signed)
Please refer to discharge instructions from Dec/24/2019:  1. Patient was instructed to resume simvastatin with new instructions of 20mg  at bedtime.  2. Aspirin and diltiazem were discontinued   *No changed made during follow up with Dr Gwenlyn Found*

## 2018-02-04 NOTE — Telephone Encounter (Signed)
New message   Pt c/o medication issue:  1. Name of Medication: Simvastatin  Asprin 81mg    2. How are you currently taking this medication (dosage and times per day)? 20mg   3. Are you having a reaction (difficulty breathing--STAT)? No  4. What is your medication issue? Pt is was told at the hospital to stop taking Simvastatin and is wondering when she can continue to take It Pt also said due to BP being low they told her not to take Asprin and she is wondering when she can continue that

## 2018-02-04 NOTE — Telephone Encounter (Signed)
Routed to Gwenlyn Found, MD and pharmD

## 2018-02-05 NOTE — Telephone Encounter (Signed)
Spoke with pt and made her aware of the Raquel, Pharm-D response. Pt sts that she would like clarification on whether or not she should use Diltiazem as needed. Diltiazem has been held and then d/c due to hypotension. Pt sts that she is seen on the Afib clinic by Orson Eva, NP. She is due for f/u with her and will defer to [facility] for recommendation regarding her Diltiazem.

## 2018-02-07 ENCOUNTER — Ambulatory Visit (HOSPITAL_COMMUNITY)
Admission: RE | Admit: 2018-02-07 | Discharge: 2018-02-07 | Disposition: A | Discharge status: Home / Self Care | Payer: Medicare Other | Source: Ambulatory Visit | Attending: Nurse Practitioner | Admitting: Nurse Practitioner

## 2018-02-07 ENCOUNTER — Other Ambulatory Visit: Payer: Self-pay

## 2018-02-07 ENCOUNTER — Encounter (HOSPITAL_COMMUNITY): Payer: Self-pay | Admitting: Nurse Practitioner

## 2018-02-07 VITALS — BP 132/74 | HR 85 | Ht 61.0 in | Wt 191.0 lb

## 2018-02-07 DIAGNOSIS — I509 Heart failure, unspecified: Secondary | ICD-10-CM | POA: Diagnosis not present

## 2018-02-07 DIAGNOSIS — Z833 Family history of diabetes mellitus: Secondary | ICD-10-CM | POA: Insufficient documentation

## 2018-02-07 DIAGNOSIS — D649 Anemia, unspecified: Secondary | ICD-10-CM | POA: Insufficient documentation

## 2018-02-07 DIAGNOSIS — Z79899 Other long term (current) drug therapy: Secondary | ICD-10-CM | POA: Diagnosis not present

## 2018-02-07 DIAGNOSIS — I480 Paroxysmal atrial fibrillation: Secondary | ICD-10-CM | POA: Diagnosis not present

## 2018-02-07 DIAGNOSIS — Z940 Kidney transplant status: Secondary | ICD-10-CM | POA: Insufficient documentation

## 2018-02-07 DIAGNOSIS — Z992 Dependence on renal dialysis: Secondary | ICD-10-CM | POA: Insufficient documentation

## 2018-02-07 DIAGNOSIS — I132 Hypertensive heart and chronic kidney disease with heart failure and with stage 5 chronic kidney disease, or end stage renal disease: Secondary | ICD-10-CM | POA: Insufficient documentation

## 2018-02-07 DIAGNOSIS — Z792 Long term (current) use of antibiotics: Secondary | ICD-10-CM | POA: Diagnosis not present

## 2018-02-07 DIAGNOSIS — K219 Gastro-esophageal reflux disease without esophagitis: Secondary | ICD-10-CM | POA: Diagnosis not present

## 2018-02-07 DIAGNOSIS — Z9011 Acquired absence of right breast and nipple: Secondary | ICD-10-CM | POA: Diagnosis not present

## 2018-02-07 DIAGNOSIS — I959 Hypotension, unspecified: Secondary | ICD-10-CM | POA: Insufficient documentation

## 2018-02-07 DIAGNOSIS — Z9049 Acquired absence of other specified parts of digestive tract: Secondary | ICD-10-CM | POA: Insufficient documentation

## 2018-02-07 DIAGNOSIS — N186 End stage renal disease: Secondary | ICD-10-CM | POA: Diagnosis not present

## 2018-02-07 DIAGNOSIS — C50911 Malignant neoplasm of unspecified site of right female breast: Secondary | ICD-10-CM | POA: Insufficient documentation

## 2018-02-07 DIAGNOSIS — Z8249 Family history of ischemic heart disease and other diseases of the circulatory system: Secondary | ICD-10-CM | POA: Diagnosis not present

## 2018-02-07 NOTE — Progress Notes (Signed)
Primary Care Physician: Lucianne Lei, MD Referring Physician: [facility] ER f/u Cardiologist: Dr. Mattie Marlin Conrey is a 63 y.o. female with a h/o ESRD on dialysis, failed transplant in 2016, HTN in the past but not treated for HTN currently,that has been having a  spells of palpitations during dialysis. Thuis is mentioned in her chart back to 2017. She was seen in the ED 11/24/15 and 12 /14/18 for same. She converted in the ER after just a few mins after arrival, ekg appeared to be be flutter. Ekg seen form 11/24/2015 flutter vrs fib. She is on ASA for a chadsvasc score of 1. She is on metoprolol 25 mg at hs only because her BP drops with dialysis if she takes her am dose. She has only had the arrhythmia following with dialysis.  F/u in afib clinic from ER, 12/20, she was given prn 30 mg Cardizem to use during dialysis and she did use once during dialysis but did not seem to help. Her nephrologist did mention that she could try 1/2 tab of 30 mg Cardizem prior to starting dialysis.I did talk to Dr. Rayann Heman and he is willing to consider primary ablation. She does have normal heart function by echo in 2017.   She saw Dr.Allred early this year and was offered ablation but she declined. 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 Now that she is post op several weeks, her BP's are running low. When she saw the kidney surgeon a few days ago, he mentioned for her to come here and get  her meds adjusted. During this time, she has not noted any afib, it has been quiet.  F/u afib clinic 5/9, on lat visit , metoprolol was adjusted as  well as Cardizem. She reports no afib and BP has been running much better, not so low.  Feels improved. She is currently happy with management.  F/u in afib clinic, 8/8, she  unfortunately was diagnosed in June of this year with rt breast CA and is undergoing chemotherapy. She will be reassessed after chemo  for next step but pt thinks she will have a double mastectomy. She has not had any issues with afib.  F/U afib clinic 02/07/18. She is s/p right mastectomy, currently on chemotherapy. 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 She has not had any heart racing or palpitation symptoms. She continues to have issues with hypotension after dialysis.    Today, she denies symptoms of palpitations, chest pain, shortness of breath, orthopnea, PND, lower extremity edema, dizziness, presyncope, syncope, or neurologic sequela. The patient is tolerating medications without difficulties and is otherwise without complaint today.   Past Medical History:  Diagnosis Date  . Anemia   . Arthritis    knees  . Breast cancer ([place]) 2019   Right Breast Cancer  . Cancer ([place])   . Chest pain Jan 2016   low risk Myoview   . CHF (congestive heart failure) ([place])   . Complication of anesthesia 2005   difficulty remembering for a while and waking up  . Constipation   . Dysrhythmia    h/o A-Fib  . ESRD on dialysis [facility]) April 2016   MWF  . GERD (gastroesophageal reflux disease)   .  Heart murmur Nov 2015   Aortic scleosis- no stenosis  . Hemodialysis patient ([place])   . Hypertension   . Shortness of breath dyspnea    with exertion   Past Surgical History:  Procedure Laterality Date  . ABDOMINAL HYSTERECTOMY  2005  . AV FISTULA PLACEMENT Left 12/09/2013   Procedure: INSERTION OF ARTERIOVENOUS (AV) GORE-TEX GRAFT ARM;  Surgeon: Elam Dutch, MD;  Location: [facility] OR;  Service: Vascular;  Laterality: Left;  . AV FISTULA PLACEMENT Right 01/01/2018   Procedure: INSERTION OF ARTERIOVENOUS (AV) GORE-TEX GRAFT ARM RIGHT ARM;  Surgeon: Elam Dutch, MD;  Location: [place];  Service: Vascular;  Laterality: Right;  . BREAST BIOPSY   1990's  . BUNIONECTOMY Bilateral   . CHOLECYSTECTOMY  2005  . COLONOSCOPY    . IR GENERIC HISTORICAL  01/11/2016   IR US GUIDE VASC ACCESS RIGHT 01/11/2016 Corrie Mckusick, DO [facility]INTER[facility]  . IR GENERIC HISTORICAL  01/11/2016   IR RADIOLOGY PERIPHERAL GUIDED IV START 01/11/2016 Corrie Mckusick, DO [facility]  . IR IMAGING GUIDED PORT INSERTION  07/05/2017  . 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 KNEE ARTHROSCOPY Bilateral   . MASTECTOMY W/ SENTINEL NODE BIOPSY Right 11/20/2017  . MASTECTOMY W/ SENTINEL NODE BIOPSY Right 11/20/2017   Procedure: RIGHT MASTECTOMY WITH SENTINEL LYMPH NODE BIOPSY;  Surgeon: Coralie Keens, MD;  Location: [place];  Service: General;  Laterality: Right;  . PERIPHERAL VASCULAR CATHETERIZATION N/A 08/31/2015   Procedure: A/V Shuntogram;  Surgeon: Serafina Mitchell, MD;  Location: [place] [facility];  Service: Cardiovascular;  Laterality: N/A;  . PERIPHERAL VASCULAR CATHETERIZATION Left 08/31/2015   Procedure: Peripheral Vascular Balloon Angioplasty;  Surgeon: Serafina Mitchell, MD;  Location: [place] [facility];  Service: Cardiovascular;  Laterality: Left;  arm fistula  . ROTATOR CUFF REPAIR Right 1997  . UPPER EXTREMITY VENOGRAPHY Right 12/24/2017   Procedure: UPPER EXTREMITY VENOGRAPHY CENTRAL VENOGRAM;  Surgeon: Waynetta Sandy, MD;  Location: [place] [facility];  Service: Cardiovascular;  Laterality: Right;    Current Outpatient Medications  Medication Sig Dispense Refill  . acetaminophen (TYLENOL) 500 MG tablet Take 1,000 mg by mouth daily as needed for mild pain, moderate pain or headache.     . clobetasol cream (TEMOVATE) 7.37 % Apply 1 application topically as needed.     . colchicine 0.6 MG tablet Take 1 tablet (0.6 mg total) by mouth daily. 41 tablet 0  . diltiazem (CARDIZEM) 30 MG tablet Take 15 mg by mouth as directed. Take one half tablet after dialysis on Monday, Wednesday and Friday    . lidocaine-prilocaine (EMLA) cream Apply 1 application  topically See admin instructions. Apply topically Monday, Wednesday and Friday before dialysis  6  . multivitamin (RENA-VIT) TABS tablet Take 1 tablet by mouth daily.    . pantoprazole (PROTONIX) 40 MG tablet TAKE 1 TABLET (40 MG TOTAL) BY MOUTH DAILY. (Patient taking differently: Take 40 mg by mouth daily. ) 90 tablet 1  . sevelamer carbonate (RENVELA) 800 MG tablet Take 800-1,600 mg by mouth See admin instructions. 1600mg  three times daily and 800mg  twice daily with snacks    . simvastatin (ZOCOR) 20 MG tablet TAKE 1 TABLET BY MOUTH EVERYDAY AT BEDTIME (Patient taking differently: Take 20 mg by mouth at bedtime. ) 90 tablet 1  . amoxicillin (AMOXIL) 500 MG capsule Take 2,000 mg by mouth See admin instructions. Take 4 capsules (2000 mg) by mouth one hour prior to dental procedures  No current facility-administered medications for this encounter.     No Known Allergies  Social History   Socioeconomic History  . Marital status: Married    Spouse name: Not on file  . Number of children: 2  . Years of education: Not on file  . Highest education level: Not on file  Occupational History  . Occupation: [employer] OFFICE    Employer: [employer]  Social Needs  . Financial resource strain: Not on file  . Food insecurity:    Worry: Not on file    Inability: Not on file  . Transportation needs:    Medical: Not on file    Non-medical: Not on file  Tobacco Use  . Smoking status: Never Smoker  . Smokeless tobacco: Never Used  Substance and Sexual Activity  . Alcohol use: No  . Drug use: No  . Sexual activity: Not on file    Comment: Hysterectomy  Lifestyle  . Physical activity:    Days per week: Not on file    Minutes per session: Not on file  . Stress: Not on file  Relationships  . Social connections:    Talks on phone: Not on file    Gets together: Not on file    Attends religious service: Not on file    Active member of club or organization: Not on file    Attends  meetings of clubs or organizations: Not on file    Relationship status: Not on file  . Intimate partner violence:    Fear of current or ex partner: Not on file    Emotionally abused: Not on file    Physically abused: Not on file    Forced sexual activity: Not on file  Other Topics Concern  . Not on file  Social History Narrative  . Not on file    Family History  Problem Relation Age of Onset  . Kidney disease Mother   . Heart attack Father   . Kidney disease Father   . Diabetes Sister   . Hyperlipidemia Sister   . Hypertension Sister   . Kidney disease Sister        x2    ROS- All systems are reviewed and negative except as per the HPI above  Physical Exam: Vitals:   02/07/18 1134  BP: 132/74  Pulse: 85  Weight: 86.6 kg  Height: 5\' 1"  (1.549 m)   Wt Readings from Last 3 Encounters:  02/07/18 86.6 kg  01/29/18 87 kg  01/25/18 87.6 kg    Labs: Lab Results  Component Value Date   NA 140 01/08/2018   K 4.0 01/08/2018   CL 96 (L) 01/08/2018   CO2 29 01/08/2018   GLUCOSE 125 (H) 01/08/2018   BUN 38 (H) 01/08/2018   CREATININE 8.68 (H) 01/08/2018   CALCIUM 8.0 (L) 01/08/2018   PHOS 3.5 01/08/2018   MG 2.1 11/24/2015   Lab Results  Component Value Date   INR 1.00 07/05/2017   Lab Results  Component Value Date   CHOL 165 06/08/2015   HDL 59 06/08/2015   LDLCALC 97 06/08/2015   TRIG 44 06/08/2015     GEN- The patient is well appearing, alert and oriented x 3 today.   HEENT-head normocephalic, atraumatic, sclera clear, conjunctiva pink, hearing intact, trachea midline. Lungs- Clear to ausculation bilaterally, normal work of breathing Heart- Regular rate and rhythm, no murmurs, rubs or gallops  GI- soft, NT, ND, + BS Extremities- no clubbing, cyanosis, or edema MS- no  significant deformity or atrophy Skin- no rash or lesion Psych- euthymic mood, full affect Neuro- strength and sensation are intact   EKG- SR HR 85, PR 158, QRS 80, QTc 468 Epic  records reviewed  Echo- 12/2017-Study Conclusions  - Left ventricle: The cavity size was normal. Systolic function was   normal. The estimated ejection fraction was in the range of 55%   to 60%. Wall motion was normal; there were no regional wall   motion abnormalities. Left ventricular diastolic function   parameters were normal. - Right atrium: Mobile calcified structur in RA not well visualized   may be calcified chiari network. - Atrial septum: No defect or patent foramen ovale was identified. - Pulmonary arteries: PA peak pressure: 33 mm Hg (S).    Assessment and Plan: 1. Paroxysmal afib/? flutter associated with dialysis No afib to report She would only be a candidate for amiodarone as far as AAD's 2/2 ESRD Offered ablation in the past but she declined  For now, anticoagulation not indicated for chadsvasc score of 1(female)  2. Hypotension She is having episodes of hypotension after dialysis which resolves before leaving the facility. She denies syncope or falls. Continue 1/2 of 30 mg in the evening after dialysis. BP stable today.  3. Breast CA Per oncology  Follow up in afib clinic in 4 months. Follow up with Dr Gwenlyn Found as scheduled.   Geroge Baseman Koreena Joost, [facility] [address] [place], [place] [address] [phone]

## 2018-02-07 NOTE — Patient Instructions (Signed)
Your physician recommends that you schedule a follow-up appointment in: 06/11/2018 11:30 at the [facility]

## 2018-02-08 ENCOUNTER — Other Ambulatory Visit: Payer: Self-pay | Admitting: Cardiovascular Disease

## 2018-02-18 ENCOUNTER — Other Ambulatory Visit: Payer: Self-pay

## 2018-02-18 DIAGNOSIS — Z171 Estrogen receptor negative status [ER-]: Principal | ICD-10-CM

## 2018-02-18 DIAGNOSIS — C50411 Malignant neoplasm of upper-outer quadrant of right female breast: Secondary | ICD-10-CM

## 2018-02-19 ENCOUNTER — Telehealth: Payer: Self-pay | Admitting: Adult Health

## 2018-02-19 ENCOUNTER — Inpatient Hospital Stay: Payer: Medicare Other

## 2018-02-19 ENCOUNTER — Encounter: Payer: Self-pay | Admitting: Adult Health

## 2018-02-19 ENCOUNTER — Inpatient Hospital Stay: Payer: Medicare Other | Attending: Hematology and Oncology

## 2018-02-19 ENCOUNTER — Inpatient Hospital Stay (HOSPITAL_BASED_OUTPATIENT_CLINIC_OR_DEPARTMENT_OTHER): Payer: Medicare Other | Admitting: Adult Health

## 2018-02-19 VITALS — BP 122/77 | HR 80 | Temp 97.9°F | Resp 17 | Ht 61.0 in | Wt 191.8 lb

## 2018-02-19 DIAGNOSIS — Z5112 Encounter for antineoplastic immunotherapy: Secondary | ICD-10-CM | POA: Diagnosis not present

## 2018-02-19 DIAGNOSIS — Z171 Estrogen receptor negative status [ER-]: Secondary | ICD-10-CM | POA: Diagnosis not present

## 2018-02-19 DIAGNOSIS — C50411 Malignant neoplasm of upper-outer quadrant of right female breast: Secondary | ICD-10-CM

## 2018-02-19 DIAGNOSIS — Z9221 Personal history of antineoplastic chemotherapy: Secondary | ICD-10-CM | POA: Insufficient documentation

## 2018-02-19 DIAGNOSIS — Z9011 Acquired absence of right breast and nipple: Secondary | ICD-10-CM | POA: Diagnosis not present

## 2018-02-19 DIAGNOSIS — Z79899 Other long term (current) drug therapy: Secondary | ICD-10-CM | POA: Diagnosis not present

## 2018-02-19 DIAGNOSIS — Z1239 Encounter for other screening for malignant neoplasm of breast: Secondary | ICD-10-CM

## 2018-02-19 DIAGNOSIS — Z95828 Presence of other vascular implants and grafts: Secondary | ICD-10-CM

## 2018-02-19 LAB — CBC WITH DIFFERENTIAL (CANCER CENTER ONLY)
Abs Immature Granulocytes: 0.01 10*3/uL (ref 0.00–0.07)
BASOS ABS: 0 10*3/uL (ref 0.0–0.1)
Basophils Relative: 1 %
EOS ABS: 0.3 10*3/uL (ref 0.0–0.5)
Eosinophils Relative: 8 %
HCT: 28.9 % — ABNORMAL LOW (ref 36.0–46.0)
Hemoglobin: 9.3 g/dL — ABNORMAL LOW (ref 12.0–15.0)
Immature Granulocytes: 0 %
Lymphocytes Relative: 26 %
Lymphs Abs: 1 10*3/uL (ref 0.7–4.0)
MCH: 32 pg (ref 26.0–34.0)
MCHC: 32.2 g/dL (ref 30.0–36.0)
MCV: 99.3 fL (ref 80.0–100.0)
Monocytes Absolute: 0.5 10*3/uL (ref 0.1–1.0)
Monocytes Relative: 13 %
NRBC: 0 % (ref 0.0–0.2)
Neutro Abs: 2 10*3/uL (ref 1.7–7.7)
Neutrophils Relative %: 52 %
PLATELETS: 158 10*3/uL (ref 150–400)
RBC: 2.91 MIL/uL — ABNORMAL LOW (ref 3.87–5.11)
RDW: 15.6 % — ABNORMAL HIGH (ref 11.5–15.5)
WBC: 3.8 10*3/uL — AB (ref 4.0–10.5)

## 2018-02-19 LAB — CMP (CANCER CENTER ONLY)
ALT: 13 U/L (ref 0–44)
AST: 19 U/L (ref 15–41)
Albumin: 3.6 g/dL (ref 3.5–5.0)
Alkaline Phosphatase: 216 U/L — ABNORMAL HIGH (ref 38–126)
Anion gap: 11 (ref 5–15)
BUN: 29 mg/dL — ABNORMAL HIGH (ref 8–23)
CHLORIDE: 101 mmol/L (ref 98–111)
CO2: 32 mmol/L (ref 22–32)
Calcium: 8.3 mg/dL — ABNORMAL LOW (ref 8.9–10.3)
Creatinine: 6.84 mg/dL (ref 0.44–1.00)
GFR, Est AFR Am: 7 mL/min — ABNORMAL LOW (ref >60)
GFR, Estimated: 6 mL/min — ABNORMAL LOW (ref >60)
Glucose, Bld: 81 mg/dL (ref 70–99)
Potassium: 3.9 mmol/L (ref 3.5–5.1)
Sodium: 144 mmol/L (ref 135–145)
Total Bilirubin: 0.6 mg/dL (ref 0.3–1.2)
Total Protein: 6.9 g/dL (ref 6.5–8.1)

## 2018-02-19 LAB — SAMPLE TO BLOOD BANK

## 2018-02-19 MED ORDER — DIPHENHYDRAMINE HCL 25 MG PO CAPS
50.0000 mg | ORAL_CAPSULE | Freq: Once | ORAL | Status: AC
  Administered 2018-02-19: 50 mg via ORAL

## 2018-02-19 MED ORDER — ACETAMINOPHEN 325 MG PO TABS
650.0000 mg | ORAL_TABLET | Freq: Once | ORAL | Status: AC
  Administered 2018-02-19: 650 mg via ORAL

## 2018-02-19 MED ORDER — SODIUM CHLORIDE 0.9 % IV SOLN
Freq: Once | INTRAVENOUS | Status: AC
  Administered 2018-02-19: 12:00:00 via INTRAVENOUS
  Filled 2018-02-19: qty 250

## 2018-02-19 MED ORDER — SODIUM CHLORIDE 0.9% FLUSH
10.0000 mL | INTRAVENOUS | Status: DC | PRN
  Administered 2018-02-19: 10 mL
  Filled 2018-02-19: qty 10

## 2018-02-19 MED ORDER — TRASTUZUMAB CHEMO 150 MG IV SOLR
6.0000 mg/kg | Freq: Once | INTRAVENOUS | Status: AC
  Administered 2018-02-19: 525 mg via INTRAVENOUS
  Filled 2018-02-19: qty 25

## 2018-02-19 MED ORDER — ACETAMINOPHEN 325 MG PO TABS
ORAL_TABLET | ORAL | Status: AC
  Filled 2018-02-19: qty 2

## 2018-02-19 MED ORDER — DIPHENHYDRAMINE HCL 25 MG PO CAPS
ORAL_CAPSULE | ORAL | Status: AC
  Filled 2018-02-19: qty 2

## 2018-02-19 MED ORDER — HEPARIN SOD (PORK) LOCK FLUSH 100 UNIT/ML IV SOLN
500.0000 [IU] | Freq: Once | INTRAVENOUS | Status: AC | PRN
  Administered 2018-02-19: 500 [IU]
  Filled 2018-02-19: qty 5

## 2018-02-19 NOTE — Progress Notes (Signed)
[facility] up:    Karen [name], [address] [place] [address]   DIAGNOSIS: Cancer Staging Malignant neoplasm of upper-outer quadrant of right breast in female, estrogen receptor negative ([place]) Staging form: Breast, AJCC 8th Edition - Clinical: Stage IA (cT1c, cN0, cM0, G3, ER-, PR-, HER2+) - Signed by Nicholas Lose, MD on 07/02/2017   SUMMARY OF ONCOLOGIC HISTORY:   Malignant neoplasm of upper-outer quadrant of right breast in female, estrogen receptor negative ([place])   06/19/2017 Initial Diagnosis    Right breast skin thickening and heaviness: Screening mammogram detected 2 cm of microcalcifications in the right breast in the posterior third UOQ.?  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  0/1 lymph node negative ER: 0%, negative, PR: 0%, negative, Her2: Positive, ratio 8.69. Ki-67: 30%     CURRENT THERAPY: Herceptin  INTERVAL HISTORY: Karen [name] 63 y.o. female returns for evaluation of her HER2 positive breast cancer on Herceptin therapy.  She is doing well today.  She saw Dr. Ninfa Linden on 02/12/2018 and will f/u with him again in 6 months.  She is feeling well and has no concerns.  She has dialysis on Monday Wednesdays and Fridays.     Patient Active Problem List   Diagnosis Date Noted  . Acute idiopathic pericarditis   . Chest pain 01/06/2018  . S/P mastectomy, right 11/20/2017  . Port-A-Cath in place 07/17/2017  .  Malignant neoplasm of upper-outer quadrant of right breast in female, estrogen receptor negative ([place]) 07/02/2017  . Cough 03/06/2017  . Elevated troponin I level 06/07/2015  . Paroxysmal atrial fibrillation ([place]) 06/07/2015  . Klebsiella pneumoniae sepsis ([place]) 12/22/2014  . Sinus tachycardia 12/21/2014  . Dyspnea 12/21/2014  . Infection of urinary tract 12/20/2014  . Sepsis ([place]) 12/19/2014  . Fever, unspecified 12/17/2014  . Thrush 12/17/2014  . Erythema induratum 12/17/2014  . Obesity, Class II, BMI 35.0-39.9, with comorbidity (see actual BMI) 11/19/2014  . Bilateral lower extremity edema 04/28/2014  . Chronic constipation 05/26/2013  . Dyspepsia 08/08/2012  . Chronic nausea 08/08/2012  . History of chest pain 06/12/2012  . End stage renal disease on dialysis ([place]) 07/04/2011    has No Known Allergies.  MEDICAL HISTORY: Past Medical History:  Diagnosis Date  . Anemia   . Arthritis    knees  . Breast cancer ([place]) 2019   Right Breast Cancer  . Cancer ([place])   . Chest pain Jan 2016   low risk Myoview   . CHF (congestive heart failure) ([place])   . Complication of anesthesia 2005   difficulty remembering for a while and waking up  . Constipation   . Dysrhythmia    h/o A-Fib  . ESRD on dialysis [facility]) April 2016   MWF  . GERD (gastroesophageal reflux disease)   . Heart murmur Nov 2015   Aortic scleosis- no stenosis  . Hemodialysis patient ([place])   . Hypertension   . Shortness of  breath dyspnea    with exertion    SURGICAL HISTORY: Past Surgical History:  Procedure Laterality Date  . ABDOMINAL HYSTERECTOMY  2005  . AV FISTULA PLACEMENT Left 12/09/2013   Procedure: INSERTION OF ARTERIOVENOUS (AV) GORE-TEX GRAFT ARM;  Surgeon: Elam Dutch, MD;  Location: [facility] OR;  Service: Vascular;  Laterality: Left;  . AV FISTULA PLACEMENT Right 01/01/2018   Procedure: INSERTION OF ARTERIOVENOUS (AV) GORE-TEX GRAFT ARM RIGHT ARM;  Surgeon: Elam Dutch, MD;  Location: [facility];   Service: Vascular;  Laterality: Right;  . BREAST BIOPSY  1990's  . BUNIONECTOMY Bilateral   . CHOLECYSTECTOMY  2005  . COLONOSCOPY    . IR GENERIC HISTORICAL  01/11/2016   IR US GUIDE VASC ACCESS RIGHT 01/11/2016 Corrie Mckusick, DO [facility]-INTER[facility]  . IR GENERIC HISTORICAL  01/11/2016   IR RADIOLOGY PERIPHERAL GUIDED IV START 01/11/2016 Corrie Mckusick, DO [facility]INTER[facility]  . IR IMAGING GUIDED PORT INSERTION  07/05/2017  . 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 KNEE ARTHROSCOPY Bilateral   . MASTECTOMY W/ SENTINEL NODE BIOPSY Right 11/20/2017  . MASTECTOMY W/ SENTINEL NODE BIOPSY Right 11/20/2017   Procedure: RIGHT MASTECTOMY WITH SENTINEL LYMPH NODE BIOPSY;  Surgeon: Coralie Keens, MD;  Location: [facility];  Service: General;  Laterality: Right;  . PERIPHERAL VASCULAR CATHETERIZATION N/A 08/31/2015   Procedure: A/V Shuntogram;  Surgeon: Serafina Mitchell, MD;  Location: [facility];  Service: Cardiovascular;  Laterality: N/A;  . PERIPHERAL VASCULAR CATHETERIZATION Left 08/31/2015   Procedure: Peripheral Vascular Balloon Angioplasty;  Surgeon: Serafina Mitchell, MD;  Location: [facility];  Service: Cardiovascular;  Laterality: Left;  arm fistula  . ROTATOR CUFF REPAIR Right 1997  . UPPER EXTREMITY VENOGRAPHY Right 12/24/2017   Procedure: UPPER EXTREMITY VENOGRAPHY CENTRAL VENOGRAM;  Surgeon: Waynetta Sandy, MD;  Location: [facility];  Service: Cardiovascular;  Laterality: Right;    SOCIAL HISTORY: Social History   Socioeconomic History  . Marital status: Married    Spouse name: Not on file  . Number of children: 2  . Years of education: Not on file  . Highest education level: Not on file  Occupational History  . Occupation: [employer] OFFICE    Employer: [employer]  Social Needs  . Financial resource strain: Not on file  . Food insecurity:    Worry: Not on file    Inability: Not on file  . Transportation needs:    Medical: Not on file    Non-medical:  Not on file  Tobacco Use  . Smoking status: Never Smoker  . Smokeless tobacco: Never Used  Substance and Sexual Activity  . Alcohol use: No  . Drug use: No  . Sexual activity: Not on file    Comment: Hysterectomy  Lifestyle  . Physical activity:    Days per week: Not on file    Minutes per session: Not on file  . Stress: Not on file  Relationships  . Social connections:    Talks on phone: Not on file    Gets together: Not on file    Attends religious service: Not on file    Active member of club or organization: Not on file    Attends meetings of clubs or organizations: Not on file    Relationship status: Not on file  . Intimate partner violence:    Fear of current or ex partner: Not on file    Emotionally abused: Not on file  Physically abused: Not on file    Forced sexual activity: Not on file  Other Topics Concern  . Not on file  Social History Narrative  . Not on file    FAMILY HISTORY: Family History  Problem Relation Age of Onset  . Kidney disease Mother   . Heart attack Father   . Kidney disease Father   . Diabetes Sister   . Hyperlipidemia Sister   . Hypertension Sister   . Kidney disease Sister        x2    Review of Systems  Constitutional: Negative for appetite change, chills, fatigue, fever and unexpected weight change.  HENT:   Negative for hearing loss, lump/mass, mouth sores and trouble swallowing.   Eyes: Negative for eye problems and icterus.  Respiratory: Negative for chest tightness, cough and shortness of breath.   Cardiovascular: Negative for chest pain, leg swelling and palpitations.  Gastrointestinal: Negative for abdominal distention, abdominal pain, constipation, diarrhea, nausea and vomiting.  Endocrine: Negative for hot flashes.  Musculoskeletal: Negative for arthralgias.  Skin: Negative for itching and rash.  Neurological: Negative for dizziness, extremity weakness, headaches and numbness.  Hematological: Negative for adenopathy.  Does not bruise/bleed easily.  Psychiatric/Behavioral: Negative for depression. The patient is not nervous/anxious.       PHYSICAL EXAMINATION  ECOG PERFORMANCE STATUS: 1 - Symptomatic but completely ambulatory  Vitals:   02/19/18 0943  BP: 122/77  Pulse: 80  Resp: 17  Temp: 97.9 F (36.6 C)  SpO2: 100%    Physical Exam Constitutional:      Appearance: Normal appearance. She is not toxic-appearing.  HENT:     Head: Normocephalic and atraumatic.     Mouth/Throat:     Mouth: Mucous membranes are moist.     Pharynx: Oropharynx is clear. No oropharyngeal exudate or posterior oropharyngeal erythema.  Eyes:     General: No scleral icterus.    Pupils: Pupils are equal, round, and reactive to light.  Neck:     Musculoskeletal: Normal range of motion and neck supple.  Cardiovascular:     Rate and Rhythm: Normal rate and regular rhythm.     Pulses: Normal pulses.     Heart sounds: Normal heart sounds.  Pulmonary:     Effort: Pulmonary effort is normal.     Breath sounds: Normal breath sounds.     Comments: Right breast s/p mastectomy, no sign of local recurrence, left breast benign Right chest wall port accessed Left chest wall dialysis access Abdominal:     General: Abdomen is flat. Bowel sounds are normal. There is no distension.     Palpations: Abdomen is soft. There is no mass.     Tenderness: There is no abdominal tenderness.  Musculoskeletal:        General: No swelling.  Lymphadenopathy:     Cervical: No cervical adenopathy.  Skin:    General: Skin is warm and dry.     Capillary Refill: Capillary refill takes less than 2 seconds.     Findings: No rash.     Comments: Nail dyscrasia, nails are growing out and tips of fingernails throughout are darkened  Neurological:     General: No focal deficit present.     Mental Status: She is alert.  Psychiatric:        Mood and Affect: Mood normal.        Behavior: Behavior normal.     LABORATORY DATA:  CBC     Component Value Date/Time   WBC  3.8 (L) 02/19/2018 0836   WBC 6.4 01/08/2018 0410   RBC 2.91 (L) 02/19/2018 0836   HGB 9.3 (L) 02/19/2018 0836   HCT 28.9 (L) 02/19/2018 0836   PLT 158 02/19/2018 0836   MCV 99.3 02/19/2018 0836   MCH 32.0 02/19/2018 0836   MCHC 32.2 02/19/2018 0836   RDW 15.6 (H) 02/19/2018 0836   LYMPHSABS 1.0 02/19/2018 0836   MONOABS 0.5 02/19/2018 0836   EOSABS 0.3 02/19/2018 0836   BASOSABS 0.0 02/19/2018 0836    CMP     Component Value Date/Time   NA 144 02/19/2018 0836   K 3.9 02/19/2018 0836   CL 101 02/19/2018 0836   CO2 32 02/19/2018 0836   GLUCOSE 81 02/19/2018 0836   BUN 29 (H) 02/19/2018 0836   CREATININE 6.84 (HH) 02/19/2018 0836   CREATININE 5.71 (H) 02/27/2014 0900   CALCIUM 8.3 (L) 02/19/2018 0836   CALCIUM 9.0 01/28/2014 1454   PROT 6.9 02/19/2018 0836   ALBUMIN 3.6 02/19/2018 0836   AST 19 02/19/2018 0836   ALT 13 02/19/2018 0836   ALKPHOS 216 (H) 02/19/2018 0836   BILITOT 0.6 02/19/2018 0836   GFRNONAA 6 (L) 02/19/2018 0836   GFRAA 7 (L) 02/19/2018 0836       ASSESSMENT and THERAPY PLAN:   Malignant neoplasm of upper-outer quadrant of right breast in female, estrogen receptor negative ([place]) 06/19/2017: Right breast skin thickening and heaviness: Screening mammogram detected 2 cm of microcalcifications in the right breast in the posterior third UOQ.? 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  0/1 lymph node negative ER: 0%, negative, PR: 0%, negative, Her2: Positive, ratio 8.69. Ki-67: 30% ---------------------------------------------------------------------------------------------------------------------------------------------- Current treatment: Herceptin maintenance every 3  weeks Hospitalization: 01/06/2018 - 01/08/2018: Pericarditis unrelated to treatment: Follows up with cardiology. Echo 01/07/18 shows EF of 55-60%  Ordered repeat echo for March and left breast screening mammogram.   Nail dyscrasia improving End-stage kidney disease on hemodialysis. Labs stable, reviewed the above in detail.    Return to clinic every 3 weeks for Herceptin every 6 weeks for follow-up with me.    Orders Placed This Encounter  Procedures  . MM 3D SCREEN BREAST UNI LEFT    Standing Status:   Future    Standing Expiration Date:   04/20/2019    Order Specific Question:   Reason for Exam (SYMPTOM  OR DIAGNOSIS REQUIRED)    Answer:   breast cancer screening    Order Specific Question:   Preferred imaging location?    Answer:   [facility]  . ECHOCARDIOGRAM COMPLETE    Standing Status:   Future    Standing Expiration Date:   05/20/2019    Order Specific Question:   Where should this test be performed    Answer:   [facility]    Order Specific Question:   Perflutren DEFINITY (image enhancing agent) should be administered unless hypersensitivity or allergy exist    Answer:   Administer Perflutren    Order Specific Question:   Reason for exam-Echo    Answer:   Chemotherapy evaluation  v87.41 / v58.11    All questions were answered. The patient knows to call the clinic with any problems, questions or concerns. We can certainly see the patient much sooner if necessary.  A total of (30) minutes of  face-to-face time was spent with this patient with greater than 50% of that time in counseling and care-coordination.  This note was electronically signed. Scot Dock, NP 02/19/2018

## 2018-02-19 NOTE — Patient Instructions (Signed)

## 2018-02-19 NOTE — Assessment & Plan Note (Addendum)
06/19/2017: Right breast skin thickening and heaviness: Screening mammogram detected 2 cm of microcalcifications in the right breast in the posterior third UOQ.? 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  0/1 lymph node negative ER: 0%, negative, PR: 0%, negative, Her2: Positive, ratio 8.69. Ki-67: 30% ---------------------------------------------------------------------------------------------------------------------------------------------- Current treatment: Herceptin maintenance every 3 weeks Hospitalization: 01/06/2018 - 01/08/2018: Pericarditis unrelated to treatment: Follows up with cardiology. Echo 01/07/18 shows EF of 55-60%  Ordered repeat echo for March and left breast screening mammogram.   Nail dyscrasia improving End-stage kidney disease on hemodialysis. Labs stable, reviewed the above in detail.    Return to clinic every 3 weeks for Herceptin every 6 weeks for follow-up with me.

## 2018-02-19 NOTE — Telephone Encounter (Signed)
No los °

## 2018-02-19 NOTE — Patient Instructions (Signed)
[facility] Discharge Instructions for Patients Receiving Chemotherapy  Today you received the following chemotherapy agents Herceptin  To help prevent nausea and vomiting after your treatment, we encourage you to take your nausea medication as directed   If you develop nausea and vomiting that is not controlled by your nausea medication, call the clinic.   BELOW ARE SYMPTOMS THAT SHOULD BE REPORTED IMMEDIATELY:  *FEVER GREATER THAN 100.5 F  *CHILLS WITH OR WITHOUT FEVER  NAUSEA AND VOMITING THAT IS NOT CONTROLLED WITH YOUR NAUSEA MEDICATION  *UNUSUAL SHORTNESS OF BREATH  *UNUSUAL BRUISING OR BLEEDING  TENDERNESS IN MOUTH AND THROAT WITH OR WITHOUT PRESENCE OF ULCERS  *URINARY PROBLEMS  *BOWEL PROBLEMS  UNUSUAL RASH Items with * indicate a potential emergency and should be followed up as soon as possible.  Feel free to call the clinic should you have any questions or concerns. The clinic phone number is [phone].  Please show the CHEMO ALERT CARD at check-in to the Emergency Department and triage nurse.   

## 2018-03-11 ENCOUNTER — Other Ambulatory Visit: Payer: Self-pay

## 2018-03-11 DIAGNOSIS — C50411 Malignant neoplasm of upper-outer quadrant of right female breast: Secondary | ICD-10-CM

## 2018-03-11 DIAGNOSIS — Z171 Estrogen receptor negative status [ER-]: Principal | ICD-10-CM

## 2018-03-12 ENCOUNTER — Ambulatory Visit: Payer: Medicare Other

## 2018-03-12 ENCOUNTER — Inpatient Hospital Stay: Payer: Medicare Other

## 2018-03-12 VITALS — BP 115/58 | HR 75 | Temp 97.8°F | Resp 18

## 2018-03-12 DIAGNOSIS — C50411 Malignant neoplasm of upper-outer quadrant of right female breast: Secondary | ICD-10-CM

## 2018-03-12 DIAGNOSIS — Z171 Estrogen receptor negative status [ER-]: Principal | ICD-10-CM

## 2018-03-12 DIAGNOSIS — Z95828 Presence of other vascular implants and grafts: Secondary | ICD-10-CM

## 2018-03-12 DIAGNOSIS — Z5112 Encounter for antineoplastic immunotherapy: Secondary | ICD-10-CM | POA: Diagnosis not present

## 2018-03-12 LAB — CBC WITH DIFFERENTIAL (CANCER CENTER ONLY)
Abs Immature Granulocytes: 0.01 10*3/uL (ref 0.00–0.07)
Basophils Absolute: 0 10*3/uL (ref 0.0–0.1)
Basophils Relative: 1 %
EOS ABS: 0.3 10*3/uL (ref 0.0–0.5)
Eosinophils Relative: 7 %
HCT: 29.4 % — ABNORMAL LOW (ref 36.0–46.0)
Hemoglobin: 9.4 g/dL — ABNORMAL LOW (ref 12.0–15.0)
IMMATURE GRANULOCYTES: 0 %
LYMPHS ABS: 0.9 10*3/uL (ref 0.7–4.0)
Lymphocytes Relative: 22 %
MCH: 32.4 pg (ref 26.0–34.0)
MCHC: 32 g/dL (ref 30.0–36.0)
MCV: 101.4 fL — ABNORMAL HIGH (ref 80.0–100.0)
Monocytes Absolute: 0.5 10*3/uL (ref 0.1–1.0)
Monocytes Relative: 12 %
Neutro Abs: 2.3 10*3/uL (ref 1.7–7.7)
Neutrophils Relative %: 58 %
Platelet Count: 153 10*3/uL (ref 150–400)
RBC: 2.9 MIL/uL — ABNORMAL LOW (ref 3.87–5.11)
RDW: 14.5 % (ref 11.5–15.5)
WBC Count: 4 10*3/uL (ref 4.0–10.5)
nRBC: 0 % (ref 0.0–0.2)

## 2018-03-12 LAB — CMP (CANCER CENTER ONLY)
ALBUMIN: 3.6 g/dL (ref 3.5–5.0)
ALT: 7 U/L (ref 0–44)
AST: 14 U/L — ABNORMAL LOW (ref 15–41)
Alkaline Phosphatase: 183 U/L — ABNORMAL HIGH (ref 38–126)
Anion gap: 11 (ref 5–15)
BUN: 25 mg/dL — AB (ref 8–23)
CO2: 32 mmol/L (ref 22–32)
Calcium: 8 mg/dL — ABNORMAL LOW (ref 8.9–10.3)
Chloride: 100 mmol/L (ref 98–111)
Creatinine: 6.38 mg/dL (ref 0.44–1.00)
GFR, Est AFR Am: 7 mL/min — ABNORMAL LOW (ref >60)
GFR, Estimated: 6 mL/min — ABNORMAL LOW (ref >60)
GLUCOSE: 89 mg/dL (ref 70–99)
POTASSIUM: 4.6 mmol/L (ref 3.5–5.1)
Sodium: 143 mmol/L (ref 135–145)
Total Bilirubin: 0.4 mg/dL (ref 0.3–1.2)
Total Protein: 7.3 g/dL (ref 6.5–8.1)

## 2018-03-12 MED ORDER — DIPHENHYDRAMINE HCL 25 MG PO CAPS
ORAL_CAPSULE | ORAL | Status: AC
  Filled 2018-03-12: qty 2

## 2018-03-12 MED ORDER — DIPHENHYDRAMINE HCL 25 MG PO CAPS
50.0000 mg | ORAL_CAPSULE | Freq: Once | ORAL | Status: AC
  Administered 2018-03-12: 50 mg via ORAL

## 2018-03-12 MED ORDER — SODIUM CHLORIDE 0.9% FLUSH
10.0000 mL | INTRAVENOUS | Status: DC | PRN
  Administered 2018-03-12: 10 mL
  Filled 2018-03-12: qty 10

## 2018-03-12 MED ORDER — SODIUM CHLORIDE 0.9 % IV SOLN
Freq: Once | INTRAVENOUS | Status: AC
  Administered 2018-03-12: 11:00:00 via INTRAVENOUS
  Filled 2018-03-12: qty 250

## 2018-03-12 MED ORDER — TRASTUZUMAB CHEMO 150 MG IV SOLR
6.0000 mg/kg | Freq: Once | INTRAVENOUS | Status: AC
  Administered 2018-03-12: 525 mg via INTRAVENOUS
  Filled 2018-03-12: qty 25

## 2018-03-12 MED ORDER — ACETAMINOPHEN 325 MG PO TABS
650.0000 mg | ORAL_TABLET | Freq: Once | ORAL | Status: AC
  Administered 2018-03-12: 650 mg via ORAL

## 2018-03-12 MED ORDER — ACETAMINOPHEN 325 MG PO TABS
ORAL_TABLET | ORAL | Status: AC
  Filled 2018-03-12: qty 2

## 2018-03-12 MED ORDER — HEPARIN SOD (PORK) LOCK FLUSH 100 UNIT/ML IV SOLN
500.0000 [IU] | Freq: Once | INTRAVENOUS | Status: AC | PRN
  Administered 2018-03-12: 500 [IU]
  Filled 2018-03-12: qty 5

## 2018-03-12 NOTE — Patient Instructions (Signed)
[facility] Discharge Instructions for Patients Receiving Chemotherapy  Today you received the following chemotherapy agents Herceptin  To help prevent nausea and vomiting after your treatment, we encourage you to take your nausea medication as directed   If you develop nausea and vomiting that is not controlled by your nausea medication, call the clinic.   BELOW ARE SYMPTOMS THAT SHOULD BE REPORTED IMMEDIATELY:  *FEVER GREATER THAN 100.5 F  *CHILLS WITH OR WITHOUT FEVER  NAUSEA AND VOMITING THAT IS NOT CONTROLLED WITH YOUR NAUSEA MEDICATION  *UNUSUAL SHORTNESS OF BREATH  *UNUSUAL BRUISING OR BLEEDING  TENDERNESS IN MOUTH AND THROAT WITH OR WITHOUT PRESENCE OF ULCERS  *URINARY PROBLEMS  *BOWEL PROBLEMS  UNUSUAL RASH Items with * indicate a potential emergency and should be followed up as soon as possible.  Feel free to call the clinic should you have any questions or concerns. The clinic phone number is [phone].  Please show the CHEMO ALERT CARD at check-in to the Emergency Department and triage nurse.   

## 2018-03-25 ENCOUNTER — Other Ambulatory Visit: Payer: Self-pay

## 2018-03-25 ENCOUNTER — Ambulatory Visit (HOSPITAL_COMMUNITY)
Admission: RE | Admit: 2018-03-25 | Discharge: 2018-03-25 | Disposition: A | Discharge status: Home / Self Care | Payer: Medicare Other | Source: Ambulatory Visit | Attending: Adult Health | Admitting: Adult Health

## 2018-03-25 ENCOUNTER — Encounter (INDEPENDENT_AMBULATORY_CARE_PROVIDER_SITE_OTHER): Payer: Self-pay

## 2018-03-25 DIAGNOSIS — Z9010 Acquired absence of unspecified breast and nipple: Secondary | ICD-10-CM | POA: Insufficient documentation

## 2018-03-25 DIAGNOSIS — C50411 Malignant neoplasm of upper-outer quadrant of right female breast: Secondary | ICD-10-CM | POA: Diagnosis not present

## 2018-03-25 DIAGNOSIS — Z171 Estrogen receptor negative status [ER-]: Secondary | ICD-10-CM | POA: Diagnosis not present

## 2018-03-25 DIAGNOSIS — R079 Chest pain, unspecified: Secondary | ICD-10-CM | POA: Insufficient documentation

## 2018-03-25 DIAGNOSIS — I071 Rheumatic tricuspid insufficiency: Secondary | ICD-10-CM | POA: Insufficient documentation

## 2018-03-25 DIAGNOSIS — I4891 Unspecified atrial fibrillation: Secondary | ICD-10-CM | POA: Diagnosis not present

## 2018-03-25 DIAGNOSIS — I10 Essential (primary) hypertension: Secondary | ICD-10-CM | POA: Diagnosis not present

## 2018-03-25 DIAGNOSIS — R000 Tachycardia, unspecified: Secondary | ICD-10-CM | POA: Insufficient documentation

## 2018-03-25 DIAGNOSIS — R9431 Abnormal electrocardiogram [ECG] [EKG]: Secondary | ICD-10-CM | POA: Diagnosis not present

## 2018-03-25 NOTE — Progress Notes (Signed)
  Echocardiogram 2D Echocardiogram has been performed.  Karen [name] 03/25/2018, 9:55 AM

## 2018-03-28 NOTE — Progress Notes (Signed)
Patient Care Team: Lucianne Lei, MD as PCP - General (Family Medicine) Lorretta Harp, MD as PCP - Cardiology (Cardiology) Nicholas Lose, MD as PCP - Hematology/Oncology (Hematology and Oncology) Donato Heinz, MD (Nephrology) Lorretta Harp, MD as Consulting Physician (Cardiology) Pyrtle, Lajuan Lines, MD as Consulting Physician (Gastroenterology) Center, [facility]  DIAGNOSIS:    ICD-10-CM   1. Malignant neoplasm of upper-outer quadrant of right breast in female, estrogen receptor negative ([place]) C50.411    Z17.1     SUMMARY OF ONCOLOGIC HISTORY:   Malignant neoplasm of upper-outer quadrant of right breast in female, estrogen receptor negative ([place])   06/19/2017 Initial Diagnosis    Right breast skin thickening and heaviness: Screening mammogram detected 2 cm of microcalcifications in the right breast in the posterior third UOQ.?  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  0/1 lymph node negative ER: 0%, negative, PR: 0%, negative, Her2: Positive, ratio 8.69. Ki-67: 30%     CHIEF COMPLIANT: Follow-up of maintenance Herceptin  INTERVAL HISTORY: Karen [name] is a 63 y.o. with above-mentioned history of right breast cancertreated with neoadjuvantchemotherapy with Taxol,Herceptin and right mastectomy who is currently on maintenance Herceptin therapy. Her most recent ECHO on 03/25/18 showed an ejection fraction in the range of 60-65%. She presents to the  clinic alone today. She is tolerating treatment well, but notes the graft on her right arm stopped working following treatments in 12/2017 and 02/2018. She currently receives dialysis the day before treatment. She is opposed to stopping treatment due to fears of recurrence. She reviewed her medication list with me.   REVIEW OF SYSTEMS:   Constitutional: Denies fevers, chills or abnormal weight loss Eyes: Denies blurriness of vision Ears, nose, mouth, throat, and face: Denies mucositis or sore throat Respiratory: Denies cough, dyspnea or wheezes Cardiovascular: Denies palpitation, chest discomfort Gastrointestinal: Denies nausea, heartburn or change in bowel habits Skin: Denies abnormal skin rashes Lymphatics: Denies new lymphadenopathy or easy bruising Neurological: Denies numbness, tingling or new weaknesses Behavioral/Psych: Mood is stable, no new changes  Extremities: No lower extremity edema Breast: denies any pain or lumps or nodules in either breasts All other systems were reviewed with the patient and are negative.  I have reviewed the past medical history, past surgical history, social history and family history with the patient and they are unchanged from previous note.  ALLERGIES:  has No Known Allergies.  MEDICATIONS:  Current Outpatient Medications  Medication Sig Dispense Refill  . acetaminophen (TYLENOL) 500 MG tablet Take 1,000 mg by mouth daily as needed for mild pain, moderate pain or headache.     Marland Kitchen amoxicillin (AMOXIL) 500 MG capsule Take 2,000 mg by mouth See admin instructions. Take 4 capsules (2000 mg) by mouth one hour prior to dental procedures    . clobetasol cream (TEMOVATE) 3.23 % Apply 1 application topically as needed.     . diltiazem (CARDIZEM) 30 MG tablet Take 15 mg by mouth as directed. Take one half  tablet after dialysis on Monday, Wednesday and Friday    . lidocaine-prilocaine (EMLA) cream Apply 1 application topically See admin instructions. Apply  topically Monday, Wednesday and Friday before dialysis  6  . multivitamin (RENA-VIT) TABS tablet Take 1 tablet by mouth daily.    . pantoprazole (PROTONIX) 40 MG tablet TAKE 1 TABLET (40 MG TOTAL) BY MOUTH DAILY. 90 tablet 1  . sevelamer carbonate (RENVELA) 800 MG tablet Take 800-1,600 mg by mouth See admin instructions. '1600mg'$  three times daily and '800mg'$  twice daily with snacks    . simvastatin (ZOCOR) 20 MG tablet TAKE 1 TABLET BY MOUTH EVERYDAY AT BEDTIME (Patient taking differently: Take 20 mg by mouth at bedtime. ) 90 tablet 1   No current facility-administered medications for this visit.    Facility-Administered Medications Ordered in Other Visits  Medication Dose Route Frequency Provider Last Rate Last Dose  . sodium chloride flush (NS) 0.9 % injection 10 mL  10 mL Intracatheter PRN Nicholas Lose, MD   10 mL at 04/02/18 0825    PHYSICAL EXAMINATION: ECOG PERFORMANCE STATUS: 1 - Symptomatic but completely ambulatory  Vitals:   04/02/18 0846  BP: 119/68  Pulse: 84  Resp: 18  Temp: 97.9 F (36.6 C)  SpO2: 100%   Filed Weights   04/02/18 0846  Weight: 191 lb 3.2 oz (86.7 kg)    GENERAL: alert, no distress and comfortable SKIN: skin color, texture, turgor are normal, no rashes or significant lesions EYES: normal, Conjunctiva are pink and non-injected, sclera clear OROPHARYNX: no exudate, no erythema and lips, buccal mucosa, and tongue normal  NECK: supple, thyroid normal size, non-tender, without nodularity LYMPH: no palpable lymphadenopathy in the cervical, axillary or inguinal LUNGS: clear to auscultation and percussion with normal breathing effort HEART: regular rate & rhythm and no murmurs and no lower extremity edema ABDOMEN: abdomen soft, non-tender and normal bowel sounds MUSCULOSKELETAL: no cyanosis of digits and no clubbing  NEURO: alert & oriented x 3 with fluent speech, no focal motor/sensory deficits EXTREMITIES: No lower extremity edema  LABORATORY DATA:  I  have reviewed the data as listed CMP Latest Ref Rng & Units 03/12/2018 02/19/2018 01/08/2018  Glucose 70 - 99 mg/dL 89 81 125(H)  BUN 8 - 23 mg/dL 25(H) 29(H) 38(H)  Creatinine 0.44 - 1.00 mg/dL 6.38(HH) 6.84(HH) 8.68(H)  Sodium 135 - 145 mmol/L 143 144 140  Potassium 3.5 - 5.1 mmol/L 4.6 3.9 4.0  Chloride 98 - 111 mmol/L 100 101 96(L)  CO2 22 - 32 mmol/L 32 32 29  Calcium 8.9 - 10.3 mg/dL 8.0(L) 8.3(L) 8.0(L)  Total Protein 6.5 - 8.1 g/dL 7.3 6.9 -  Total Bilirubin 0.3 - 1.2 mg/dL 0.4 0.6 -  Alkaline Phos 38 - 126 U/L 183(H) 216(H) -  AST 15 - 41 U/L 14(L) 19 -  ALT 0 - 44 U/L 7 13 -    Lab Results  Component Value Date   WBC 4.0 03/12/2018   HGB 9.4 (L) 03/12/2018   HCT 29.4 (L) 03/12/2018   MCV 101.4 (H) 03/12/2018   PLT 153 03/12/2018   NEUTROABS 2.3 03/12/2018    ASSESSMENT & PLAN:  Malignant neoplasm of upper-outer quadrant of right breast in female, estrogen receptor negative ([place]) 06/19/2017: Right breast skin thickening and heaviness: Screening mammogram detected 2 cm of microcalcifications in the right breast in the posterior third UOQ.? Skin involvement, stereotactic biopsy revealed IDC grade 2-3 with high-grade DCIS, lymphovascular invasion present, ER 0%, PR 0%, HER-2 positive ratio 8.69, gene  copy #11.3  Neoadjuvant chemotherapy with Taxol Herceptin x10  11/20/2017:Right mastectomy: No residual invasive cancer. 0/1 lymph node negative ER: 0%, negative, PR: 0%, negative, Her2: Positive, ratio 8.69. Ki-67: 30% ---------------------------------------------------------------------------------------------------------------------------------------------- Current treatment: Herceptin maintenance every 3 weeks Hospitalization: 01/06/2018 - 01/08/2018: Pericarditis unrelated to treatment: Follows up with cardiology. Echo 03/25/2018 shows EF of 60 to 65%  left breast mammogram scheduled for 06/20/2018 Nail dyscrasia improving End-stage kidney disease on hemodialysis. Patient  tells me that after Herceptin treatment her dialysis graft has not been working.  This is happened once so far.  She will await and see what happens after today's treatment. We discussed if this continues to happen, we may have to discuss the risks and benefits of stopping Herceptin earlier.  However the patient is not keen to stop Herceptin at this time.  Anemia due to chronic kidney disease  Return to clinic every 3 weeks for Herceptin every 6 weeks for follow-up with me.  No orders of the defined types were placed in this encounter.  The patient has a good understanding of the overall plan. she agrees with it. she will call with any problems that may develop before the next visit here.  Nicholas Lose, MD 04/02/2018  Julious Oka Dorshimer am acting as scribe for Dr. Nicholas Lose.  I have reviewed the above documentation for accuracy and completeness, and I agree with the above.

## 2018-04-02 ENCOUNTER — Inpatient Hospital Stay: Payer: Medicare Other

## 2018-04-02 ENCOUNTER — Inpatient Hospital Stay: Payer: Medicare Other | Attending: Hematology and Oncology

## 2018-04-02 ENCOUNTER — Inpatient Hospital Stay (HOSPITAL_BASED_OUTPATIENT_CLINIC_OR_DEPARTMENT_OTHER): Payer: Medicare Other | Admitting: Hematology and Oncology

## 2018-04-02 ENCOUNTER — Other Ambulatory Visit: Payer: Self-pay

## 2018-04-02 ENCOUNTER — Telehealth: Payer: Self-pay | Admitting: Hematology and Oncology

## 2018-04-02 DIAGNOSIS — C50411 Malignant neoplasm of upper-outer quadrant of right female breast: Secondary | ICD-10-CM

## 2018-04-02 DIAGNOSIS — Z79899 Other long term (current) drug therapy: Secondary | ICD-10-CM

## 2018-04-02 DIAGNOSIS — D631 Anemia in chronic kidney disease: Secondary | ICD-10-CM

## 2018-04-02 DIAGNOSIS — Z171 Estrogen receptor negative status [ER-]: Secondary | ICD-10-CM | POA: Insufficient documentation

## 2018-04-02 DIAGNOSIS — Z9221 Personal history of antineoplastic chemotherapy: Secondary | ICD-10-CM

## 2018-04-02 DIAGNOSIS — Z9011 Acquired absence of right breast and nipple: Secondary | ICD-10-CM

## 2018-04-02 DIAGNOSIS — Z95828 Presence of other vascular implants and grafts: Secondary | ICD-10-CM

## 2018-04-02 DIAGNOSIS — Z5112 Encounter for antineoplastic immunotherapy: Secondary | ICD-10-CM | POA: Diagnosis present

## 2018-04-02 DIAGNOSIS — Z992 Dependence on renal dialysis: Secondary | ICD-10-CM

## 2018-04-02 DIAGNOSIS — N186 End stage renal disease: Secondary | ICD-10-CM | POA: Diagnosis not present

## 2018-04-02 LAB — CBC WITH DIFFERENTIAL (CANCER CENTER ONLY)
Abs Immature Granulocytes: 0.01 10*3/uL (ref 0.00–0.07)
Basophils Absolute: 0.1 10*3/uL (ref 0.0–0.1)
Basophils Relative: 1 %
Eosinophils Absolute: 0.4 10*3/uL (ref 0.0–0.5)
Eosinophils Relative: 8 %
HCT: 29.6 % — ABNORMAL LOW (ref 36.0–46.0)
HEMOGLOBIN: 9.4 g/dL — AB (ref 12.0–15.0)
Immature Granulocytes: 0 %
LYMPHS ABS: 0.8 10*3/uL (ref 0.7–4.0)
Lymphocytes Relative: 18 %
MCH: 32.6 pg (ref 26.0–34.0)
MCHC: 31.8 g/dL (ref 30.0–36.0)
MCV: 102.8 fL — AB (ref 80.0–100.0)
MONOS PCT: 11 %
Monocytes Absolute: 0.5 10*3/uL (ref 0.1–1.0)
Neutro Abs: 2.8 10*3/uL (ref 1.7–7.7)
Neutrophils Relative %: 62 %
Platelet Count: 193 10*3/uL (ref 150–400)
RBC: 2.88 MIL/uL — ABNORMAL LOW (ref 3.87–5.11)
RDW: 14 % (ref 11.5–15.5)
WBC Count: 4.4 10*3/uL (ref 4.0–10.5)
nRBC: 0 % (ref 0.0–0.2)

## 2018-04-02 LAB — CMP (CANCER CENTER ONLY)
ALT: 13 U/L (ref 0–44)
AST: 19 U/L (ref 15–41)
Albumin: 3.4 g/dL — ABNORMAL LOW (ref 3.5–5.0)
Alkaline Phosphatase: 190 U/L — ABNORMAL HIGH (ref 38–126)
Anion gap: 17 — ABNORMAL HIGH (ref 5–15)
BUN: 27 mg/dL — ABNORMAL HIGH (ref 8–23)
CHLORIDE: 98 mmol/L (ref 98–111)
CO2: 28 mmol/L (ref 22–32)
Calcium: 8 mg/dL — ABNORMAL LOW (ref 8.9–10.3)
Creatinine: 6.73 mg/dL (ref 0.44–1.00)
GFR, Est AFR Am: 7 mL/min — ABNORMAL LOW (ref >60)
GFR, Estimated: 6 mL/min — ABNORMAL LOW (ref >60)
Glucose, Bld: 82 mg/dL (ref 70–99)
Potassium: 3.5 mmol/L (ref 3.5–5.1)
Sodium: 143 mmol/L (ref 135–145)
Total Bilirubin: 0.5 mg/dL (ref 0.3–1.2)
Total Protein: 7.2 g/dL (ref 6.5–8.1)

## 2018-04-02 MED ORDER — TRASTUZUMAB CHEMO 150 MG IV SOLR
6.0000 mg/kg | Freq: Once | INTRAVENOUS | Status: AC
  Administered 2018-04-02: 525 mg via INTRAVENOUS
  Filled 2018-04-02: qty 25

## 2018-04-02 MED ORDER — DIPHENHYDRAMINE HCL 25 MG PO CAPS
50.0000 mg | ORAL_CAPSULE | Freq: Once | ORAL | Status: AC
  Administered 2018-04-02: 50 mg via ORAL

## 2018-04-02 MED ORDER — ACETAMINOPHEN 325 MG PO TABS
650.0000 mg | ORAL_TABLET | Freq: Once | ORAL | Status: AC
  Administered 2018-04-02: 650 mg via ORAL

## 2018-04-02 MED ORDER — SODIUM CHLORIDE 0.9% FLUSH
10.0000 mL | INTRAVENOUS | Status: DC | PRN
  Administered 2018-04-02: 10 mL
  Filled 2018-04-02: qty 10

## 2018-04-02 MED ORDER — DIPHENHYDRAMINE HCL 25 MG PO CAPS
ORAL_CAPSULE | ORAL | Status: AC
  Filled 2018-04-02: qty 2

## 2018-04-02 MED ORDER — SODIUM CHLORIDE 0.9 % IV SOLN
Freq: Once | INTRAVENOUS | Status: AC
  Administered 2018-04-02: 09:00:00 via INTRAVENOUS
  Filled 2018-04-02: qty 250

## 2018-04-02 MED ORDER — ACETAMINOPHEN 325 MG PO TABS
ORAL_TABLET | ORAL | Status: AC
  Filled 2018-04-02: qty 2

## 2018-04-02 MED ORDER — HEPARIN SOD (PORK) LOCK FLUSH 100 UNIT/ML IV SOLN
500.0000 [IU] | Freq: Once | INTRAVENOUS | Status: AC | PRN
  Administered 2018-04-02: 500 [IU]
  Filled 2018-04-02: qty 5

## 2018-04-02 NOTE — Patient Instructions (Signed)
[facility] Discharge Instructions for Patients Receiving Chemotherapy  Today you received the following chemotherapy agents Herceptin  To help prevent nausea and vomiting after your treatment, we encourage you to take your nausea medication as directed   If you develop nausea and vomiting that is not controlled by your nausea medication, call the clinic.   BELOW ARE SYMPTOMS THAT SHOULD BE REPORTED IMMEDIATELY:  *FEVER GREATER THAN 100.5 F  *CHILLS WITH OR WITHOUT FEVER  NAUSEA AND VOMITING THAT IS NOT CONTROLLED WITH YOUR NAUSEA MEDICATION  *UNUSUAL SHORTNESS OF BREATH  *UNUSUAL BRUISING OR BLEEDING  TENDERNESS IN MOUTH AND THROAT WITH OR WITHOUT PRESENCE OF ULCERS  *URINARY PROBLEMS  *BOWEL PROBLEMS  UNUSUAL RASH Items with * indicate a potential emergency and should be followed up as soon as possible.  Feel free to call the clinic should you have any questions or concerns. The clinic phone number is [phone].  Please show the CHEMO ALERT CARD at check-in to the Emergency Department and triage nurse.   

## 2018-04-02 NOTE — Telephone Encounter (Signed)
Gave avs and calendar ° °

## 2018-04-02 NOTE — Assessment & Plan Note (Signed)
06/19/2017: Right breast skin thickening and heaviness: Screening mammogram detected 2 cm of microcalcifications in the right breast in the posterior third UOQ.? 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 0/1 lymph node negative ER: 0%, negative, PR: 0%, negative, Her2: Positive, ratio 8.69. Ki-67: 30% ---------------------------------------------------------------------------------------------------------------------------------------------- Current treatment: Herceptin maintenance every 3 weeks Hospitalization: 01/06/2018 - 01/08/2018: Pericarditis unrelated to treatment: Follows up with cardiology. Echo 03/25/2018 shows EF of 60 to 65%  left breast mammogram scheduled for 06/20/2018 Nail dyscrasia improving End-stage kidney disease on hemodialysis. Labs stable, reviewed the above in detail.    Return to clinic every 3 weeks for Herceptin every 6 weeks for follow-up with me.

## 2018-04-08 ENCOUNTER — Telehealth: Payer: Self-pay | Admitting: *Deleted

## 2018-04-08 NOTE — Telephone Encounter (Signed)
Pt called stating that following her Herceptin treatments, her dialysis graft does not want to work.  Pt states she had treatment on Tuesday 04/02/2018 and was able to go to dialysis on Wednesday and Friday with no issues but on Saturday her graft did not want to work. Pt stated that she was calling us to let us know that she will be having the dialysis catheter placed in her chest so she can continue to receive dialysis with no issues.  I apologized to the pt for her having to go through all of this, and told the pt to call us if she has any more issues.  Pt verbalized understanding.

## 2018-04-19 ENCOUNTER — Other Ambulatory Visit: Payer: Self-pay | Admitting: Hematology and Oncology

## 2018-04-19 NOTE — Addendum Note (Signed)
Addended by: Nicholas Lose on: 04/19/2018 10:09 AM   Modules accepted: Orders

## 2018-04-23 ENCOUNTER — Inpatient Hospital Stay: Payer: Medicare Other

## 2018-04-23 ENCOUNTER — Other Ambulatory Visit: Payer: Self-pay

## 2018-04-23 ENCOUNTER — Inpatient Hospital Stay: Payer: Medicare Other | Attending: Hematology and Oncology

## 2018-04-23 VITALS — BP 113/59 | HR 75 | Temp 98.3°F | Resp 18 | Wt 191.5 lb

## 2018-04-23 DIAGNOSIS — C50411 Malignant neoplasm of upper-outer quadrant of right female breast: Secondary | ICD-10-CM

## 2018-04-23 DIAGNOSIS — Z5112 Encounter for antineoplastic immunotherapy: Secondary | ICD-10-CM | POA: Insufficient documentation

## 2018-04-23 DIAGNOSIS — Z9011 Acquired absence of right breast and nipple: Secondary | ICD-10-CM | POA: Insufficient documentation

## 2018-04-23 DIAGNOSIS — N186 End stage renal disease: Secondary | ICD-10-CM | POA: Insufficient documentation

## 2018-04-23 DIAGNOSIS — Z171 Estrogen receptor negative status [ER-]: Secondary | ICD-10-CM | POA: Insufficient documentation

## 2018-04-23 DIAGNOSIS — Z79899 Other long term (current) drug therapy: Secondary | ICD-10-CM | POA: Insufficient documentation

## 2018-04-23 DIAGNOSIS — D631 Anemia in chronic kidney disease: Secondary | ICD-10-CM | POA: Insufficient documentation

## 2018-04-23 DIAGNOSIS — Z992 Dependence on renal dialysis: Secondary | ICD-10-CM | POA: Insufficient documentation

## 2018-04-23 DIAGNOSIS — Z95828 Presence of other vascular implants and grafts: Secondary | ICD-10-CM

## 2018-04-23 DIAGNOSIS — Z9221 Personal history of antineoplastic chemotherapy: Secondary | ICD-10-CM | POA: Insufficient documentation

## 2018-04-23 LAB — CMP (CANCER CENTER ONLY)
ALT: 11 U/L (ref 0–44)
AST: 16 U/L (ref 15–41)
Albumin: 3.5 g/dL (ref 3.5–5.0)
Alkaline Phosphatase: 203 U/L — ABNORMAL HIGH (ref 38–126)
Anion gap: 13 (ref 5–15)
BUN: 33 mg/dL — ABNORMAL HIGH (ref 8–23)
CO2: 30 mmol/L (ref 22–32)
Calcium: 8.5 mg/dL — ABNORMAL LOW (ref 8.9–10.3)
Chloride: 100 mmol/L (ref 98–111)
Creatinine: 7.12 mg/dL (ref 0.44–1.00)
GFR, Est AFR Am: 6 mL/min — ABNORMAL LOW (ref >60)
GFR, Estimated: 6 mL/min — ABNORMAL LOW (ref >60)
Glucose, Bld: 149 mg/dL — ABNORMAL HIGH (ref 70–99)
Potassium: 3.9 mmol/L (ref 3.5–5.1)
Sodium: 143 mmol/L (ref 135–145)
Total Bilirubin: 0.4 mg/dL (ref 0.3–1.2)
Total Protein: 7.3 g/dL (ref 6.5–8.1)

## 2018-04-23 LAB — CBC WITH DIFFERENTIAL (CANCER CENTER ONLY)
Abs Immature Granulocytes: 0.01 10*3/uL (ref 0.00–0.07)
Basophils Absolute: 0 10*3/uL (ref 0.0–0.1)
Basophils Relative: 1 %
Eosinophils Absolute: 0.4 10*3/uL (ref 0.0–0.5)
Eosinophils Relative: 9 %
HCT: 28.8 % — ABNORMAL LOW (ref 36.0–46.0)
Hemoglobin: 9.2 g/dL — ABNORMAL LOW (ref 12.0–15.0)
Immature Granulocytes: 0 %
Lymphocytes Relative: 23 %
Lymphs Abs: 0.9 10*3/uL (ref 0.7–4.0)
MCH: 32.7 pg (ref 26.0–34.0)
MCHC: 31.9 g/dL (ref 30.0–36.0)
MCV: 102.5 fL — ABNORMAL HIGH (ref 80.0–100.0)
Monocytes Absolute: 0.5 10*3/uL (ref 0.1–1.0)
Monocytes Relative: 11 %
Neutro Abs: 2.3 10*3/uL (ref 1.7–7.7)
Neutrophils Relative %: 56 %
Platelet Count: 159 10*3/uL (ref 150–400)
RBC: 2.81 MIL/uL — ABNORMAL LOW (ref 3.87–5.11)
RDW: 13.6 % (ref 11.5–15.5)
WBC Count: 4.1 10*3/uL (ref 4.0–10.5)
nRBC: 0 % (ref 0.0–0.2)

## 2018-04-23 MED ORDER — TRASTUZUMAB CHEMO 150 MG IV SOLR
6.0000 mg/kg | Freq: Once | INTRAVENOUS | Status: AC
  Administered 2018-04-23: 525 mg via INTRAVENOUS
  Filled 2018-04-23: qty 25

## 2018-04-23 MED ORDER — ACETAMINOPHEN 325 MG PO TABS
650.0000 mg | ORAL_TABLET | Freq: Once | ORAL | Status: AC
  Administered 2018-04-23: 15:00:00 650 mg via ORAL

## 2018-04-23 MED ORDER — SODIUM CHLORIDE 0.9% FLUSH
10.0000 mL | INTRAVENOUS | Status: DC | PRN
  Administered 2018-04-23: 10 mL
  Filled 2018-04-23: qty 10

## 2018-04-23 MED ORDER — DIPHENHYDRAMINE HCL 25 MG PO CAPS
ORAL_CAPSULE | ORAL | Status: AC
  Filled 2018-04-23: qty 2

## 2018-04-23 MED ORDER — ACETAMINOPHEN 325 MG PO TABS
ORAL_TABLET | ORAL | Status: AC
  Filled 2018-04-23: qty 2

## 2018-04-23 MED ORDER — SODIUM CHLORIDE 0.9 % IV SOLN
Freq: Once | INTRAVENOUS | Status: AC
  Administered 2018-04-23: 14:00:00 via INTRAVENOUS
  Filled 2018-04-23: qty 250

## 2018-04-23 MED ORDER — DIPHENHYDRAMINE HCL 25 MG PO CAPS
50.0000 mg | ORAL_CAPSULE | Freq: Once | ORAL | Status: AC
  Administered 2018-04-23: 50 mg via ORAL

## 2018-04-23 MED ORDER — HEPARIN SOD (PORK) LOCK FLUSH 100 UNIT/ML IV SOLN
500.0000 [IU] | Freq: Once | INTRAVENOUS | Status: AC | PRN
  Administered 2018-04-23: 500 [IU]
  Filled 2018-04-23: qty 5

## 2018-04-23 MED ORDER — SODIUM CHLORIDE 0.9% FLUSH
10.0000 mL | INTRAVENOUS | Status: DC | PRN
  Administered 2018-04-23: 16:00:00 10 mL
  Filled 2018-04-23: qty 10

## 2018-04-23 NOTE — Patient Instructions (Signed)
[facility] Discharge Instructions for Patients Receiving Chemotherapy  Today you received the following chemotherapy agents: Herceptin.  To help prevent nausea and vomiting after your treatment, we encourage you to take your nausea medication as directed.  If you develop nausea and vomiting that is not controlled by your nausea medication, call the clinic.   BELOW ARE SYMPTOMS THAT SHOULD BE REPORTED IMMEDIATELY:  *FEVER GREATER THAN 100.5 F  *CHILLS WITH OR WITHOUT FEVER  NAUSEA AND VOMITING THAT IS NOT CONTROLLED WITH YOUR NAUSEA MEDICATION  *UNUSUAL SHORTNESS OF BREATH  *UNUSUAL BRUISING OR BLEEDING  TENDERNESS IN MOUTH AND THROAT WITH OR WITHOUT PRESENCE OF ULCERS  *URINARY PROBLEMS  *BOWEL PROBLEMS  UNUSUAL RASH Items with * indicate a potential emergency and should be followed up as soon as possible.  Feel free to call the clinic should you have any questions or concerns. The clinic phone number is [phone].  Please show the [place] at check-in to the Emergency Department and triage nurse.  This information is directly available on the CDC website: RunningShows.co.za.html    Source:CDC Reference to specific commercial products, manufacturers, companies, or trademarks does not constitute its endorsement or recommendation by the [place], [place], or Centers for [employer] and [employer].

## 2018-05-06 NOTE — Assessment & Plan Note (Signed)
06/19/2017: Right breast skin thickening and heaviness: Screening mammogram detected 2 cm of microcalcifications in the right breast in the posterior third UOQ.? 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 0/1 lymph node negative ER: 0%, negative, PR: 0%, negative, Her2: Positive, ratio 8.69. Ki-67: 30% ---------------------------------------------------------------------------------------------------------------------------------------------- Current treatment: Herceptin maintenance every 3 weeks Hospitalization: 01/06/2018 - 01/08/2018: Pericarditis unrelated to treatment: Follows up with cardiology. Echo 03/25/2018 shows EF of 60 to 65%  left breast mammogram scheduled for 06/20/2018 Nail dyscrasia improving End-stage kidney disease on hemodialysis.  Patient tells me that after Herceptin treatment her dialysis graft has not been working.  This is happened once so far.  She will await and see what happens after today's treatment. We discussed if this continues to happen, we may have to discuss the risks and benefits of stopping Herceptin earlier.  However the patient is not keen to stop Herceptin at this time.  Anemia due to chronic kidney disease  Return to clinic every 3 weeks for Herceptin every 6 weeks for follow-up with me.

## 2018-05-13 NOTE — Progress Notes (Signed)
Patient Care Team: Lucianne Lei, MD as PCP - General (Family Medicine) Lorretta Harp, MD as PCP - Cardiology (Cardiology) Nicholas Lose, MD as PCP - Hematology/Oncology (Hematology and Oncology) Donato Heinz, MD (Nephrology) Lorretta Harp, MD as Consulting Physician (Cardiology) Pyrtle, Lajuan Lines, MD as Consulting Physician (Gastroenterology) Center, [facility]  DIAGNOSIS:    ICD-10-CM   1. Malignant neoplasm of upper-outer quadrant of right breast in female, estrogen receptor negative ([place]) C50.411    Z17.1     SUMMARY OF ONCOLOGIC HISTORY:   Malignant neoplasm of upper-outer quadrant of right breast in female, estrogen receptor negative ([place])   06/19/2017 Initial Diagnosis    Right breast skin thickening and heaviness: Screening mammogram detected 2 cm of microcalcifications in the right breast in the posterior third UOQ.?  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  0/1 lymph node negative ER: 0%, negative, PR: 0%, negative, Her2: Positive, ratio 8.69. Ki-67: 30%     CHIEF COMPLIANT: Follow-up of maintenance Herceptin  INTERVAL HISTORY: Karen [name] is a 63 y.o. with above-mentioned history of right breast cancer treated with neoadjuvantchemotherapy with Taxol,Herceptin and right mastectomy who is currently on maintenance Herceptin therapy. She presents to the clinic today for treatment.  She is tolerating Herceptin extremely well without any  problems or concerns.  Denies any diarrhea or constipation denies any fevers or chills.  REVIEW OF SYSTEMS:   Constitutional: Denies fevers, chills or abnormal weight loss Eyes: Denies blurriness of vision Ears, nose, mouth, throat, and face: Denies mucositis or sore throat Respiratory: Denies cough, dyspnea or wheezes Cardiovascular: Denies palpitation, chest discomfort Gastrointestinal: Denies nausea, heartburn or change in bowel habits Skin: Denies abnormal skin rashes Lymphatics: Denies new lymphadenopathy or easy bruising Neurological: Denies numbness, tingling or new weaknesses Behavioral/Psych: Mood is stable, no new changes  Extremities: No lower extremity edema Breast: denies any pain or lumps or nodules in either breasts All other systems were reviewed with the patient and are negative.  I have reviewed the past medical history, past surgical history, social history and family history with the patient and they are unchanged from previous note.  ALLERGIES:  has No Known Allergies.  MEDICATIONS:  Current Outpatient Medications  Medication Sig Dispense Refill  . acetaminophen (TYLENOL) 500 MG tablet Take 1,000 mg by mouth daily as needed for mild pain, moderate pain or headache.     Marland Kitchen amoxicillin (AMOXIL) 500 MG capsule Take 2,000 mg by mouth See admin instructions. Take 4 capsules (2000 mg) by mouth one hour prior to dental procedures    . clobetasol cream (TEMOVATE) 7.61 % Apply 1 application topically as needed.     . diltiazem (CARDIZEM) 30 MG tablet Take 15 mg by mouth as directed. Take one half tablet after dialysis on Monday, Wednesday and Friday    . lidocaine-prilocaine (EMLA) cream Apply 1 application topically See admin instructions. Apply topically Monday, Wednesday and Friday before dialysis  6  . multivitamin (RENA-VIT) TABS tablet  Take 1 tablet by mouth daily.    . pantoprazole (PROTONIX) 40 MG tablet TAKE 1 TABLET (40 MG TOTAL) BY MOUTH DAILY. 90 tablet 1  .  sevelamer carbonate (RENVELA) 800 MG tablet Take 800-1,600 mg by mouth See admin instructions. '1600mg'$  three times daily and '800mg'$  twice daily with snacks    . simvastatin (ZOCOR) 20 MG tablet TAKE 1 TABLET BY MOUTH EVERYDAY AT BEDTIME (Patient taking differently: Take 20 mg by mouth at bedtime. ) 90 tablet 1   No current facility-administered medications for this visit.    Facility-Administered Medications Ordered in Other Visits  Medication Dose Route Frequency Provider Last Rate Last Dose  . sodium chloride flush (NS) 0.9 % injection 10 mL  10 mL Intracatheter PRN Nicholas Lose, MD   10 mL at 05/14/18 0754    PHYSICAL EXAMINATION: ECOG PERFORMANCE STATUS: 1 - Symptomatic but completely ambulatory  There were no vitals filed for this visit. There were no vitals filed for this visit.  GENERAL: alert, no distress and comfortable SKIN: skin color, texture, turgor are normal, no rashes or significant lesions EYES: normal, Conjunctiva are pink and non-injected, sclera clear OROPHARYNX: no exudate, no erythema and lips, buccal mucosa, and tongue normal  NECK: supple, thyroid normal size, non-tender, without nodularity LYMPH: no palpable lymphadenopathy in the cervical, axillary or inguinal LUNGS: clear to auscultation and percussion with normal breathing effort HEART: regular rate & rhythm and no murmurs and no lower extremity edema ABDOMEN: abdomen soft, non-tender and normal bowel sounds MUSCULOSKELETAL: no cyanosis of digits and no clubbing  NEURO: alert & oriented x 3 with fluent speech, no focal motor/sensory deficits EXTREMITIES: No lower extremity edema  LABORATORY DATA:  I have reviewed the data as listed CMP Latest Ref Rng & Units 04/23/2018 04/02/2018 03/12/2018  Glucose 70 - 99 mg/dL 149(H) 82 89  BUN 8 - 23 mg/dL 33(H) 27(H) 25(H)  Creatinine 0.44 - 1.00 mg/dL 7.12(HH) 6.73(HH) 6.38(HH)  Sodium 135 - 145 mmol/L 143 143 143  Potassium 3.5 - 5.1 mmol/L 3.9 3.5 4.6  Chloride 98 -  111 mmol/L 100 98 100  CO2 22 - 32 mmol/L 30 28 32  Calcium 8.9 - 10.3 mg/dL 8.5(L) 8.0(L) 8.0(L)  Total Protein 6.5 - 8.1 g/dL 7.3 7.2 7.3  Total Bilirubin 0.3 - 1.2 mg/dL 0.4 0.5 0.4  Alkaline Phos 38 - 126 U/L 203(H) 190(H) 183(H)  AST 15 - 41 U/L 16 19 14(L)  ALT 0 - 44 U/L '11 13 7    '$ Lab Results  Component Value Date   WBC 4.1 04/23/2018   HGB 9.2 (L) 04/23/2018   HCT 28.8 (L) 04/23/2018   MCV 102.5 (H) 04/23/2018   PLT 159 04/23/2018   NEUTROABS 2.3 04/23/2018    ASSESSMENT & PLAN:  Malignant neoplasm of upper-outer quadrant of right breast in female, estrogen receptor negative ([place]) 06/19/2017: Right breast skin thickening and heaviness: Screening mammogram detected 2 cm of microcalcifications in the right breast in the posterior third UOQ.? 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 0/1 lymph node negative ER: 0%, negative, PR: 0%, negative, Her2: Positive, ratio 8.69. 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 Echo 03/25/2018 shows EF of 60 to 65%  left breast mammogram scheduled for 06/20/2018 Nail dyscrasia improving End-stage kidney disease on hemodialysis.  Every time she received Herceptin the graft appeared to be stopping.  She wonders if it is related to hypotension during the treatments.  I do not suspect that however it is still unexplained.  She now has a dialysis catheter.    Anemia due to chronic kidney disease: Hemoglobin is stable at 9.4  today.  Return to clinic every 3 weeks for Herceptin every 6 weeks for follow-up with me.    No orders of the defined types were placed in this encounter.  The patient has a good understanding of the overall plan. she agrees with it. she will call with any problems that may develop before the next visit here.  Nicholas Lose, MD 05/14/2018  Julious Oka Dorshimer am acting as scribe for Dr. Nicholas Lose.  I have reviewed the above documentation for accuracy and completeness, and I agree with the above.

## 2018-05-14 ENCOUNTER — Inpatient Hospital Stay: Payer: Medicare Other

## 2018-05-14 ENCOUNTER — Inpatient Hospital Stay (HOSPITAL_BASED_OUTPATIENT_CLINIC_OR_DEPARTMENT_OTHER): Payer: Medicare Other | Admitting: Hematology and Oncology

## 2018-05-14 ENCOUNTER — Encounter: Payer: Self-pay | Admitting: *Deleted

## 2018-05-14 ENCOUNTER — Other Ambulatory Visit: Payer: Self-pay

## 2018-05-14 DIAGNOSIS — Z79899 Other long term (current) drug therapy: Secondary | ICD-10-CM

## 2018-05-14 DIAGNOSIS — Z5112 Encounter for antineoplastic immunotherapy: Secondary | ICD-10-CM | POA: Diagnosis not present

## 2018-05-14 DIAGNOSIS — N186 End stage renal disease: Secondary | ICD-10-CM

## 2018-05-14 DIAGNOSIS — Z992 Dependence on renal dialysis: Secondary | ICD-10-CM

## 2018-05-14 DIAGNOSIS — Z9221 Personal history of antineoplastic chemotherapy: Secondary | ICD-10-CM | POA: Diagnosis not present

## 2018-05-14 DIAGNOSIS — C50411 Malignant neoplasm of upper-outer quadrant of right female breast: Secondary | ICD-10-CM

## 2018-05-14 DIAGNOSIS — Z171 Estrogen receptor negative status [ER-]: Principal | ICD-10-CM

## 2018-05-14 DIAGNOSIS — Z95828 Presence of other vascular implants and grafts: Secondary | ICD-10-CM

## 2018-05-14 DIAGNOSIS — Z9011 Acquired absence of right breast and nipple: Secondary | ICD-10-CM

## 2018-05-14 DIAGNOSIS — D631 Anemia in chronic kidney disease: Secondary | ICD-10-CM

## 2018-05-14 LAB — CBC WITH DIFFERENTIAL (CANCER CENTER ONLY)
Abs Immature Granulocytes: 0.01 10*3/uL (ref 0.00–0.07)
Basophils Absolute: 0.1 10*3/uL (ref 0.0–0.1)
Basophils Relative: 1 %
Eosinophils Absolute: 0.4 10*3/uL (ref 0.0–0.5)
Eosinophils Relative: 9 %
HCT: 29.6 % — ABNORMAL LOW (ref 36.0–46.0)
Hemoglobin: 9.4 g/dL — ABNORMAL LOW (ref 12.0–15.0)
Immature Granulocytes: 0 %
Lymphocytes Relative: 20 %
Lymphs Abs: 0.8 10*3/uL (ref 0.7–4.0)
MCH: 32.6 pg (ref 26.0–34.0)
MCHC: 31.8 g/dL (ref 30.0–36.0)
MCV: 102.8 fL — ABNORMAL HIGH (ref 80.0–100.0)
Monocytes Absolute: 0.4 10*3/uL (ref 0.1–1.0)
Monocytes Relative: 10 %
Neutro Abs: 2.5 10*3/uL (ref 1.7–7.7)
Neutrophils Relative %: 60 %
Platelet Count: 179 10*3/uL (ref 150–400)
RBC: 2.88 MIL/uL — ABNORMAL LOW (ref 3.87–5.11)
RDW: 13.5 % (ref 11.5–15.5)
WBC Count: 4.3 10*3/uL (ref 4.0–10.5)
nRBC: 0 % (ref 0.0–0.2)

## 2018-05-14 LAB — CMP (CANCER CENTER ONLY)
ALT: 9 U/L (ref 0–44)
AST: 14 U/L — ABNORMAL LOW (ref 15–41)
Albumin: 3.5 g/dL (ref 3.5–5.0)
Alkaline Phosphatase: 206 U/L — ABNORMAL HIGH (ref 38–126)
Anion gap: 15 (ref 5–15)
BUN: 32 mg/dL — ABNORMAL HIGH (ref 8–23)
CO2: 29 mmol/L (ref 22–32)
Calcium: 8.4 mg/dL — ABNORMAL LOW (ref 8.9–10.3)
Chloride: 100 mmol/L (ref 98–111)
Creatinine: 7.12 mg/dL (ref 0.44–1.00)
GFR, Est AFR Am: 6 mL/min — ABNORMAL LOW (ref >60)
GFR, Estimated: 6 mL/min — ABNORMAL LOW (ref >60)
Glucose, Bld: 90 mg/dL (ref 70–99)
Potassium: 3.6 mmol/L (ref 3.5–5.1)
Sodium: 144 mmol/L (ref 135–145)
Total Bilirubin: 0.4 mg/dL (ref 0.3–1.2)
Total Protein: 7.2 g/dL (ref 6.5–8.1)

## 2018-05-14 MED ORDER — ACETAMINOPHEN 325 MG PO TABS
650.0000 mg | ORAL_TABLET | Freq: Once | ORAL | Status: AC
  Administered 2018-05-14: 650 mg via ORAL

## 2018-05-14 MED ORDER — TRASTUZUMAB CHEMO 150 MG IV SOLR
6.0000 mg/kg | Freq: Once | INTRAVENOUS | Status: AC
  Administered 2018-05-14: 525 mg via INTRAVENOUS
  Filled 2018-05-14: qty 25

## 2018-05-14 MED ORDER — SODIUM CHLORIDE 0.9% FLUSH
10.0000 mL | INTRAVENOUS | Status: DC | PRN
  Administered 2018-05-14: 08:00:00 10 mL
  Filled 2018-05-14: qty 10

## 2018-05-14 MED ORDER — SODIUM CHLORIDE 0.9 % IV SOLN
Freq: Once | INTRAVENOUS | Status: AC
  Administered 2018-05-14: 09:00:00 via INTRAVENOUS
  Filled 2018-05-14: qty 250

## 2018-05-14 MED ORDER — HEPARIN SOD (PORK) LOCK FLUSH 100 UNIT/ML IV SOLN
500.0000 [IU] | Freq: Once | INTRAVENOUS | Status: AC | PRN
  Administered 2018-05-14: 11:00:00 500 [IU]
  Filled 2018-05-14: qty 5

## 2018-05-14 MED ORDER — SODIUM CHLORIDE 0.9% FLUSH
10.0000 mL | INTRAVENOUS | Status: DC | PRN
  Administered 2018-05-14: 10 mL
  Filled 2018-05-14: qty 10

## 2018-05-14 MED ORDER — ACETAMINOPHEN 325 MG PO TABS
ORAL_TABLET | ORAL | Status: AC
  Filled 2018-05-14: qty 2

## 2018-05-14 MED ORDER — DIPHENHYDRAMINE HCL 25 MG PO CAPS
50.0000 mg | ORAL_CAPSULE | Freq: Once | ORAL | Status: AC
  Administered 2018-05-14: 50 mg via ORAL

## 2018-05-14 MED ORDER — DIPHENHYDRAMINE HCL 25 MG PO CAPS
ORAL_CAPSULE | ORAL | Status: AC
  Filled 2018-05-14: qty 2

## 2018-05-14 NOTE — Patient Instructions (Signed)
[facility] Discharge Instructions for Patients Receiving Chemotherapy  Today you received the following chemotherapy agents: Herceptin.  To help prevent nausea and vomiting after your treatment, we encourage you to take your nausea medication as directed.  If you develop nausea and vomiting that is not controlled by your nausea medication, call the clinic.   BELOW ARE SYMPTOMS THAT SHOULD BE REPORTED IMMEDIATELY:  *FEVER GREATER THAN 100.5 F  *CHILLS WITH OR WITHOUT FEVER  NAUSEA AND VOMITING THAT IS NOT CONTROLLED WITH YOUR NAUSEA MEDICATION  *UNUSUAL SHORTNESS OF BREATH  *UNUSUAL BRUISING OR BLEEDING  TENDERNESS IN MOUTH AND THROAT WITH OR WITHOUT PRESENCE OF ULCERS  *URINARY PROBLEMS  *BOWEL PROBLEMS  UNUSUAL RASH Items with * indicate a potential emergency and should be followed up as soon as possible.  Feel free to call the clinic should you have any questions or concerns. The clinic phone number is [phone].  Please show the [facility] at check-in to the Emergency Department and triage nurse.  This information is directly available on the CDC website: RunningShows.co.za.html    Source:CDC Reference to specific commercial products, manufacturers, companies, or trademarks does not constitute its endorsement or recommendation by the [place], [place], or Centers for [employer] and [employer].

## 2018-05-14 NOTE — Patient Instructions (Signed)

## 2018-05-16 ENCOUNTER — Telehealth: Payer: Self-pay | Admitting: *Deleted

## 2018-05-16 NOTE — Telephone Encounter (Signed)
Received call from pt stating that her dialysis graft stopped working yesterday 05/15/2018.  Pt states that every time she has her Herceptin treatment, she has issues with her dialysis graft.  Pt states she also has a dialysis catheter in her chest at this time and they are able to use that to dialyze her.  Pt wanted Dr. Lindi Adie to be aware of what is going on.  Per Dr. Lindi Adie if pts dialysis catheter were to stop working, we would need to stop her Herceptin treatments so she can continue to receive dialysis with no problems.  I educated pt on this and she stated she will keep in touch with [place] if she does experience any further problems with her dialysis catheter.

## 2018-06-04 ENCOUNTER — Inpatient Hospital Stay: Payer: Medicare Other | Attending: Hematology and Oncology

## 2018-06-04 ENCOUNTER — Inpatient Hospital Stay: Payer: Medicare Other

## 2018-06-04 ENCOUNTER — Other Ambulatory Visit: Payer: Self-pay

## 2018-06-04 VITALS — BP 99/71 | HR 78 | Temp 98.3°F | Resp 17 | Wt 191.0 lb

## 2018-06-04 DIAGNOSIS — C50411 Malignant neoplasm of upper-outer quadrant of right female breast: Secondary | ICD-10-CM | POA: Diagnosis not present

## 2018-06-04 DIAGNOSIS — Z9011 Acquired absence of right breast and nipple: Secondary | ICD-10-CM | POA: Insufficient documentation

## 2018-06-04 DIAGNOSIS — Z79899 Other long term (current) drug therapy: Secondary | ICD-10-CM | POA: Insufficient documentation

## 2018-06-04 DIAGNOSIS — Z9221 Personal history of antineoplastic chemotherapy: Secondary | ICD-10-CM | POA: Diagnosis not present

## 2018-06-04 DIAGNOSIS — Z5112 Encounter for antineoplastic immunotherapy: Secondary | ICD-10-CM | POA: Insufficient documentation

## 2018-06-04 DIAGNOSIS — Z992 Dependence on renal dialysis: Secondary | ICD-10-CM | POA: Insufficient documentation

## 2018-06-04 DIAGNOSIS — Z171 Estrogen receptor negative status [ER-]: Secondary | ICD-10-CM

## 2018-06-04 DIAGNOSIS — N186 End stage renal disease: Secondary | ICD-10-CM | POA: Insufficient documentation

## 2018-06-04 DIAGNOSIS — D631 Anemia in chronic kidney disease: Secondary | ICD-10-CM | POA: Insufficient documentation

## 2018-06-04 DIAGNOSIS — Z95828 Presence of other vascular implants and grafts: Secondary | ICD-10-CM

## 2018-06-04 LAB — CMP (CANCER CENTER ONLY)
ALT: 10 U/L (ref 0–44)
AST: 13 U/L — ABNORMAL LOW (ref 15–41)
Albumin: 3.5 g/dL (ref 3.5–5.0)
Alkaline Phosphatase: 214 U/L — ABNORMAL HIGH (ref 38–126)
Anion gap: 13 (ref 5–15)
BUN: 36 mg/dL — ABNORMAL HIGH (ref 8–23)
CO2: 29 mmol/L (ref 22–32)
Calcium: 8.7 mg/dL — ABNORMAL LOW (ref 8.9–10.3)
Chloride: 99 mmol/L (ref 98–111)
Creatinine: 7.13 mg/dL (ref 0.44–1.00)
GFR, Est AFR Am: 6 mL/min — ABNORMAL LOW (ref >60)
GFR, Estimated: 6 mL/min — ABNORMAL LOW (ref >60)
Glucose, Bld: 91 mg/dL (ref 70–99)
Potassium: 4 mmol/L (ref 3.5–5.1)
Sodium: 141 mmol/L (ref 135–145)
Total Bilirubin: 0.4 mg/dL (ref 0.3–1.2)
Total Protein: 7.5 g/dL (ref 6.5–8.1)

## 2018-06-04 LAB — CBC WITH DIFFERENTIAL (CANCER CENTER ONLY)
Abs Immature Granulocytes: 0.02 10*3/uL (ref 0.00–0.07)
Basophils Absolute: 0.1 10*3/uL (ref 0.0–0.1)
Basophils Relative: 1 %
Eosinophils Absolute: 0.4 10*3/uL (ref 0.0–0.5)
Eosinophils Relative: 9 %
HCT: 28.8 % — ABNORMAL LOW (ref 36.0–46.0)
Hemoglobin: 9.3 g/dL — ABNORMAL LOW (ref 12.0–15.0)
Immature Granulocytes: 1 %
Lymphocytes Relative: 19 %
Lymphs Abs: 0.8 10*3/uL (ref 0.7–4.0)
MCH: 32.6 pg (ref 26.0–34.0)
MCHC: 32.3 g/dL (ref 30.0–36.0)
MCV: 101.1 fL — ABNORMAL HIGH (ref 80.0–100.0)
Monocytes Absolute: 0.5 10*3/uL (ref 0.1–1.0)
Monocytes Relative: 11 %
Neutro Abs: 2.6 10*3/uL (ref 1.7–7.7)
Neutrophils Relative %: 59 %
Platelet Count: 203 10*3/uL (ref 150–400)
RBC: 2.85 MIL/uL — ABNORMAL LOW (ref 3.87–5.11)
RDW: 13.7 % (ref 11.5–15.5)
WBC Count: 4.4 10*3/uL (ref 4.0–10.5)
nRBC: 0 % (ref 0.0–0.2)

## 2018-06-04 MED ORDER — SODIUM CHLORIDE 0.9% FLUSH
10.0000 mL | INTRAVENOUS | Status: DC | PRN
  Administered 2018-06-04: 10 mL
  Filled 2018-06-04: qty 10

## 2018-06-04 MED ORDER — TRASTUZUMAB CHEMO 150 MG IV SOLR
6.0000 mg/kg | Freq: Once | INTRAVENOUS | Status: AC
  Administered 2018-06-04: 525 mg via INTRAVENOUS
  Filled 2018-06-04: qty 25

## 2018-06-04 MED ORDER — SODIUM CHLORIDE 0.9 % IV SOLN
Freq: Once | INTRAVENOUS | Status: AC
  Administered 2018-06-04: 09:00:00 via INTRAVENOUS
  Filled 2018-06-04: qty 250

## 2018-06-04 MED ORDER — HEPARIN SOD (PORK) LOCK FLUSH 100 UNIT/ML IV SOLN
500.0000 [IU] | Freq: Once | INTRAVENOUS | Status: AC | PRN
  Administered 2018-06-04: 10:00:00 500 [IU]
  Filled 2018-06-04: qty 5

## 2018-06-04 MED ORDER — DIPHENHYDRAMINE HCL 25 MG PO CAPS
50.0000 mg | ORAL_CAPSULE | Freq: Once | ORAL | Status: AC
  Administered 2018-06-04: 09:00:00 50 mg via ORAL

## 2018-06-04 MED ORDER — DIPHENHYDRAMINE HCL 25 MG PO CAPS
ORAL_CAPSULE | ORAL | Status: AC
  Filled 2018-06-04: qty 2

## 2018-06-04 MED ORDER — ACETAMINOPHEN 325 MG PO TABS
650.0000 mg | ORAL_TABLET | Freq: Once | ORAL | Status: AC
  Administered 2018-06-04: 650 mg via ORAL

## 2018-06-04 MED ORDER — ACETAMINOPHEN 325 MG PO TABS
ORAL_TABLET | ORAL | Status: AC
  Filled 2018-06-04: qty 2

## 2018-06-04 NOTE — Progress Notes (Signed)
Dr. Lindi Adie notified of creatinine level - Ok to proceed with treatment, pt undergoing dialysis.

## 2018-06-04 NOTE — Patient Instructions (Signed)
[facility] Discharge Instructions for Patients Receiving Chemotherapy  Today you received the following chemotherapy agents: Herceptin  To help prevent nausea and vomiting after your treatment, we encourage you to take your nausea medication as directed.   If you develop nausea and vomiting that is not controlled by your nausea medication, call the clinic.   BELOW ARE SYMPTOMS THAT SHOULD BE REPORTED IMMEDIATELY:  *FEVER GREATER THAN 100.5 F  *CHILLS WITH OR WITHOUT FEVER  NAUSEA AND VOMITING THAT IS NOT CONTROLLED WITH YOUR NAUSEA MEDICATION  *UNUSUAL SHORTNESS OF BREATH  *UNUSUAL BRUISING OR BLEEDING  TENDERNESS IN MOUTH AND THROAT WITH OR WITHOUT PRESENCE OF ULCERS  *URINARY PROBLEMS  *BOWEL PROBLEMS  UNUSUAL RASH Items with * indicate a potential emergency and should be followed up as soon as possible.  Feel free to call the clinic should you have any questions or concerns. The clinic phone number is [phone].  Please show the CHEMO ALERT CARD at check-in to the Emergency Department and triage nurse.  Coronavirus (COVID-19) Are you at risk?  Are you at risk for the Coronavirus (COVID-19)?  To be considered HIGH RISK for Coronavirus (COVID-19), you have to meet the following criteria:  . Traveled to [place], [place], [place], [place] or [place]; or in the [place] to [place], [place], [place], or [place]; and have fever, cough, and shortness of breath within the last 2 weeks of travel OR . Been in close contact with a person diagnosed with COVID-19 within the last 2 weeks and have fever, cough, and shortness of breath . IF YOU DO NOT MEET THESE CRITERIA, YOU ARE CONSIDERED LOW RISK FOR COVID-19.  What to do if you are HIGH RISK for COVID-19?  . If you are having a medical emergency, call 911. . Seek medical care right away. Before you go to a doctor's office, urgent care or emergency department, call ahead and tell them about your  recent travel, contact with someone diagnosed with COVID-19, and your symptoms. You should receive instructions from your physician's office regarding next steps of care.  . When you arrive at healthcare provider, tell the healthcare staff immediately you have returned from visiting [place], [place], [place], [place] or [place]; or traveled in the [place] to [place], [place], [place], or [place]; in the last two weeks or you have been in close contact with a person diagnosed with COVID-19 in the last 2 weeks.   . Tell the health care staff about your symptoms: fever, cough and shortness of breath. . After you have been seen by a medical provider, you will be either: o Tested for (COVID-19) and discharged home on quarantine except to seek medical care if symptoms worsen, and asked to  - Stay home and avoid contact with others until you get your results (4-5 days)  - Avoid travel on public transportation if possible (such as bus, train, or airplane) or o Sent to the Emergency Department by EMS for evaluation, COVID-19 testing, and possible admission depending on your condition and test results.  What to do if you are LOW RISK for COVID-19?  Reduce your risk of any infection by using the same precautions used for avoiding the common cold or flu:  . Wash your hands often with soap and warm water for at least 20 seconds.  If soap and water are not readily available, use an alcohol-based hand sanitizer with at least 60% alcohol.  . If coughing or sneezing,   cover your mouth and nose by coughing or sneezing into the elbow areas of your shirt or coat, into a tissue or into your sleeve (not your hands). . Avoid shaking hands with others and consider head nods or verbal greetings only. . Avoid touching your eyes, nose, or mouth with unwashed hands.  . Avoid close contact with people who are sick. . Avoid places or events with large numbers of people in one location, like concerts or sporting  events. . Carefully consider travel plans you have or are making. . If you are planning any travel outside or inside the [place], visit the CDC's Travelers' Health webpage for the latest health notices. . If you have some symptoms but not all symptoms, continue to monitor at home and seek medical attention if your symptoms worsen. . If you are having a medical emergency, call 911.   ADDITIONAL HEALTHCARE OPTIONS FOR PATIENTS  [facility] Telehealth / e-Visit: [email or web address]         [facility]: [phone]  [facility]: [phone]                   [facility]: [phone]     

## 2018-06-11 ENCOUNTER — Inpatient Hospital Stay (HOSPITAL_COMMUNITY): Admission: RE | Admit: 2018-06-11 | Payer: Medicare Other | Source: Ambulatory Visit | Admitting: Nurse Practitioner

## 2018-06-17 ENCOUNTER — Other Ambulatory Visit (HOSPITAL_COMMUNITY): Payer: Self-pay | Admitting: Nurse Practitioner

## 2018-06-20 ENCOUNTER — Ambulatory Visit
Admission: RE | Admit: 2018-06-20 | Discharge: 2018-06-20 | Disposition: A | Discharge status: Home / Self Care | Payer: Medicare Other | Source: Ambulatory Visit | Attending: Adult Health | Admitting: Adult Health

## 2018-06-20 ENCOUNTER — Other Ambulatory Visit: Payer: Self-pay

## 2018-06-20 DIAGNOSIS — Z1239 Encounter for other screening for malignant neoplasm of breast: Secondary | ICD-10-CM

## 2018-06-25 ENCOUNTER — Inpatient Hospital Stay: Payer: Medicare Other

## 2018-06-25 ENCOUNTER — Inpatient Hospital Stay: Payer: Medicare Other | Attending: Hematology and Oncology

## 2018-06-25 ENCOUNTER — Other Ambulatory Visit: Payer: Self-pay

## 2018-06-25 VITALS — BP 101/50 | HR 81 | Temp 98.0°F | Resp 16 | Wt 193.2 lb

## 2018-06-25 DIAGNOSIS — Z171 Estrogen receptor negative status [ER-]: Secondary | ICD-10-CM | POA: Diagnosis not present

## 2018-06-25 DIAGNOSIS — Z5112 Encounter for antineoplastic immunotherapy: Secondary | ICD-10-CM | POA: Diagnosis present

## 2018-06-25 DIAGNOSIS — C50411 Malignant neoplasm of upper-outer quadrant of right female breast: Secondary | ICD-10-CM

## 2018-06-25 DIAGNOSIS — Z79899 Other long term (current) drug therapy: Secondary | ICD-10-CM | POA: Diagnosis not present

## 2018-06-25 DIAGNOSIS — N186 End stage renal disease: Secondary | ICD-10-CM | POA: Diagnosis not present

## 2018-06-25 DIAGNOSIS — Z9011 Acquired absence of right breast and nipple: Secondary | ICD-10-CM | POA: Diagnosis not present

## 2018-06-25 DIAGNOSIS — Z992 Dependence on renal dialysis: Secondary | ICD-10-CM | POA: Insufficient documentation

## 2018-06-25 DIAGNOSIS — D631 Anemia in chronic kidney disease: Secondary | ICD-10-CM | POA: Diagnosis not present

## 2018-06-25 DIAGNOSIS — Z95828 Presence of other vascular implants and grafts: Secondary | ICD-10-CM

## 2018-06-25 LAB — CBC WITH DIFFERENTIAL (CANCER CENTER ONLY)
Abs Immature Granulocytes: 0.01 10*3/uL (ref 0.00–0.07)
Basophils Absolute: 0.1 10*3/uL (ref 0.0–0.1)
Basophils Relative: 2 %
Eosinophils Absolute: 0.5 10*3/uL (ref 0.0–0.5)
Eosinophils Relative: 11 %
HCT: 30.5 % — ABNORMAL LOW (ref 36.0–46.0)
Hemoglobin: 9.5 g/dL — ABNORMAL LOW (ref 12.0–15.0)
Immature Granulocytes: 0 %
Lymphocytes Relative: 19 %
Lymphs Abs: 0.8 10*3/uL (ref 0.7–4.0)
MCH: 32.3 pg (ref 26.0–34.0)
MCHC: 31.1 g/dL (ref 30.0–36.0)
MCV: 103.7 fL — ABNORMAL HIGH (ref 80.0–100.0)
Monocytes Absolute: 0.4 10*3/uL (ref 0.1–1.0)
Monocytes Relative: 10 %
Neutro Abs: 2.6 10*3/uL (ref 1.7–7.7)
Neutrophils Relative %: 58 %
Platelet Count: 202 10*3/uL (ref 150–400)
RBC: 2.94 MIL/uL — ABNORMAL LOW (ref 3.87–5.11)
RDW: 13.8 % (ref 11.5–15.5)
WBC Count: 4.4 10*3/uL (ref 4.0–10.5)
nRBC: 0 % (ref 0.0–0.2)

## 2018-06-25 LAB — CMP (CANCER CENTER ONLY)
ALT: 9 U/L (ref 0–44)
AST: 15 U/L (ref 15–41)
Albumin: 3.5 g/dL (ref 3.5–5.0)
Alkaline Phosphatase: 184 U/L — ABNORMAL HIGH (ref 38–126)
Anion gap: 16 — ABNORMAL HIGH (ref 5–15)
BUN: 33 mg/dL — ABNORMAL HIGH (ref 8–23)
CO2: 26 mmol/L (ref 22–32)
Calcium: 8.3 mg/dL — ABNORMAL LOW (ref 8.9–10.3)
Chloride: 100 mmol/L (ref 98–111)
Creatinine: 7.18 mg/dL (ref 0.44–1.00)
GFR, Est AFR Am: 6 mL/min — ABNORMAL LOW (ref >60)
GFR, Estimated: 6 mL/min — ABNORMAL LOW (ref >60)
Glucose, Bld: 111 mg/dL — ABNORMAL HIGH (ref 70–99)
Potassium: 4.8 mmol/L (ref 3.5–5.1)
Sodium: 142 mmol/L (ref 135–145)
Total Bilirubin: 0.4 mg/dL (ref 0.3–1.2)
Total Protein: 7.6 g/dL (ref 6.5–8.1)

## 2018-06-25 MED ORDER — HEPARIN SOD (PORK) LOCK FLUSH 100 UNIT/ML IV SOLN
500.0000 [IU] | Freq: Once | INTRAVENOUS | Status: AC | PRN
  Administered 2018-06-25: 11:00:00 500 [IU]
  Filled 2018-06-25: qty 5

## 2018-06-25 MED ORDER — TRASTUZUMAB CHEMO 150 MG IV SOLR
6.0000 mg/kg | Freq: Once | INTRAVENOUS | Status: AC
  Administered 2018-06-25: 10:00:00 525 mg via INTRAVENOUS
  Filled 2018-06-25: qty 25

## 2018-06-25 MED ORDER — SODIUM CHLORIDE 0.9 % IV SOLN
Freq: Once | INTRAVENOUS | Status: AC
  Administered 2018-06-25: 09:00:00 via INTRAVENOUS
  Filled 2018-06-25: qty 250

## 2018-06-25 MED ORDER — ACETAMINOPHEN 325 MG PO TABS
650.0000 mg | ORAL_TABLET | Freq: Once | ORAL | Status: AC
  Administered 2018-06-25: 650 mg via ORAL

## 2018-06-25 MED ORDER — SODIUM CHLORIDE 0.9% FLUSH
10.0000 mL | INTRAVENOUS | Status: DC | PRN
  Administered 2018-06-25: 10 mL
  Filled 2018-06-25: qty 10

## 2018-06-25 MED ORDER — DIPHENHYDRAMINE HCL 25 MG PO CAPS
ORAL_CAPSULE | ORAL | Status: AC
  Filled 2018-06-25: qty 2

## 2018-06-25 MED ORDER — DIPHENHYDRAMINE HCL 25 MG PO CAPS
50.0000 mg | ORAL_CAPSULE | Freq: Once | ORAL | Status: AC
  Administered 2018-06-25: 50 mg via ORAL

## 2018-06-25 MED ORDER — ACETAMINOPHEN 325 MG PO TABS
ORAL_TABLET | ORAL | Status: AC
  Filled 2018-06-25: qty 2

## 2018-06-25 MED ORDER — SODIUM CHLORIDE 0.9 % IV SOLN
Freq: Once | INTRAVENOUS | Status: AC
  Administered 2018-06-25: 10:00:00 via INTRAVENOUS
  Filled 2018-06-25: qty 250

## 2018-06-25 NOTE — Patient Instructions (Signed)
[facility] Discharge Instructions for Patients Receiving Chemotherapy  Today you received the following Immunotherapy:  Herceptin  To help prevent nausea and vomiting after your treatment, we encourage you to take your nausea medication as directed by your MD.   If you develop nausea and vomiting that is not controlled by your nausea medication, call the clinic.   BELOW ARE SYMPTOMS THAT SHOULD BE REPORTED IMMEDIATELY:  *FEVER GREATER THAN 100.5 F  *CHILLS WITH OR WITHOUT FEVER  NAUSEA AND VOMITING THAT IS NOT CONTROLLED WITH YOUR NAUSEA MEDICATION  *UNUSUAL SHORTNESS OF BREATH  *UNUSUAL BRUISING OR BLEEDING  TENDERNESS IN MOUTH AND THROAT WITH OR WITHOUT PRESENCE OF ULCERS  *URINARY PROBLEMS  *BOWEL PROBLEMS  UNUSUAL RASH Items with * indicate a potential emergency and should be followed up as soon as possible.  Feel free to call the clinic should you have any questions or concerns. The clinic phone number is [phone].  Please show the CHEMO ALERT CARD at check-in to the Emergency Department and triage nurse.  Coronavirus (COVID-19) Are you at risk?  Are you at risk for the Coronavirus (COVID-19)?  To be considered HIGH RISK for Coronavirus (COVID-19), you have to meet the following criteria:  . Traveled to [place], [place], [place], [place] or [place]; or in the [place] to [place], [place], [place], or [place]; and have fever, cough, and shortness of breath within the last 2 weeks of travel OR . Been in close contact with a person diagnosed with COVID-19 within the last 2 weeks and have fever, cough, and shortness of breath . IF YOU DO NOT MEET THESE CRITERIA, YOU ARE CONSIDERED LOW RISK FOR COVID-19.  What to do if you are HIGH RISK for COVID-19?  . If you are having a medical emergency, call 911. . Seek medical care right away. Before you go to a doctor's office, urgent care or emergency department, call ahead and tell them about  your recent travel, contact with someone diagnosed with COVID-19, and your symptoms. You should receive instructions from your physician's office regarding next steps of care.  . When you arrive at healthcare provider, tell the healthcare staff immediately you have returned from visiting [place], [place], [place], [place] or [place]; or traveled in the [place] to [place], [place], [place], or [place]; in the last two weeks or you have been in close contact with a person diagnosed with COVID-19 in the last 2 weeks.   . Tell the health care staff about your symptoms: fever, cough and shortness of breath. . After you have been seen by a medical provider, you will be either: o Tested for (COVID-19) and discharged home on quarantine except to seek medical care if symptoms worsen, and asked to  - Stay home and avoid contact with others until you get your results (4-5 days)  - Avoid travel on public transportation if possible (such as bus, train, or airplane) or o Sent to the Emergency Department by EMS for evaluation, COVID-19 testing, and possible admission depending on your condition and test results.  What to do if you are LOW RISK for COVID-19?  Reduce your risk of any infection by using the same precautions used for avoiding the common cold or flu:  . Wash your hands often with soap and warm water for at least 20 seconds.  If soap and water are not readily available, use an alcohol-based hand sanitizer with at least 60% alcohol.  . If   coughing or sneezing, cover your mouth and nose by coughing or sneezing into the elbow areas of your shirt or coat, into a tissue or into your sleeve (not your hands). . Avoid shaking hands with others and consider head nods or verbal greetings only. . Avoid touching your eyes, nose, or mouth with unwashed hands.  . Avoid close contact with people who are sick. . Avoid places or events with large numbers of people in one location, like concerts or sporting  events. . Carefully consider travel plans you have or are making. . If you are planning any travel outside or inside the [place], visit the CDC's Travelers' Health webpage for the latest health notices. . If you have some symptoms but not all symptoms, continue to monitor at home and seek medical attention if your symptoms worsen. . If you are having a medical emergency, call 911.   ADDITIONAL HEALTHCARE OPTIONS FOR PATIENTS  [facility] Telehealth / e-Visit: [email or web address]         [facility]: [phone]  [facility]: [phone]                   [facility]: [phone]    

## 2018-06-25 NOTE — Progress Notes (Signed)
Pt. BP low 89/51 HR=81, pt. denies chest pain, dizziness and no shortness of breath noted. MD notified and new order received for IV fluids.

## 2018-07-05 ENCOUNTER — Telehealth: Payer: Self-pay | Admitting: Cardiovascular Disease

## 2018-07-05 NOTE — Telephone Encounter (Signed)
Patient of Dr. Gwenlyn Found reports her BP has been bottoming out while at dialysis (82/52) for about 2 weeks and she also reports her BP was low at chemo a few weeks back, requiring IV fluids. She does not check BP at home - she has a BP cuff but it is always off so she does not check it. She is NOT on any BP medications.   Advised would route to MD for advice

## 2018-07-05 NOTE — Telephone Encounter (Signed)
New message     Pt c/o BP issue: STAT if pt c/o blurred vision, one-sided weakness or slurred speech  1. What are your last 5 BP readings? Patient does not have any readings at this time  2. Are you having any other symptoms (ex. Dizziness, headache, blurred vision, passed out)?no   3. What is your BP issue?Patient states that her blood pressure is bottoming out it is low.

## 2018-07-07 NOTE — Telephone Encounter (Signed)
She is on no anti hypertensive meds. This needs to be addressed by her Nephrologist and HD MD.

## 2018-07-08 NOTE — Telephone Encounter (Signed)
Pt aware of the following and verbalized understanding:  "She is on no anti hypertensive meds. This needs to be addressed by her Nephrologist and HD MD."

## 2018-07-10 NOTE — Assessment & Plan Note (Signed)
06/19/2017: Right breast skin thickening and heaviness: Screening mammogram detected 2 cm of microcalcifications in the right breast in the posterior third UOQ.? 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 0/1 lymph node negative ER: 0%, negative, PR: 0%, negative, Her2: Positive, ratio 8.69. 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 Echo3/9/2020shows EF of 60 to 65%  left breastmammogram scheduled for 06/20/2018 Nail dyscrasia improving End-stage kidney disease on hemodialysis.  Every time she received Herceptin the graft appeared to be stopping.  She wonders if it is related to hypotension during the treatments.  I do not suspect that however it is still unexplained.  She now has a dialysis catheter.    Anemia due to chronic kidney disease: Hemoglobin is stable at 9.4 today.  Return to clinic every 3 weeks for Herceptin every 6 weeks for follow-up with me.

## 2018-07-11 ENCOUNTER — Other Ambulatory Visit: Payer: Self-pay

## 2018-07-11 ENCOUNTER — Encounter (HOSPITAL_COMMUNITY): Payer: Self-pay | Admitting: Nurse Practitioner

## 2018-07-11 ENCOUNTER — Ambulatory Visit (HOSPITAL_COMMUNITY)
Admission: RE | Admit: 2018-07-11 | Discharge: 2018-07-11 | Disposition: A | Discharge status: Home / Self Care | Payer: Medicare Other | Source: Ambulatory Visit | Attending: Nurse Practitioner | Admitting: Nurse Practitioner

## 2018-07-11 VITALS — BP 86/52 | HR 93 | Ht 61.0 in | Wt 193.0 lb

## 2018-07-11 DIAGNOSIS — N186 End stage renal disease: Secondary | ICD-10-CM | POA: Insufficient documentation

## 2018-07-11 DIAGNOSIS — C50911 Malignant neoplasm of unspecified site of right female breast: Secondary | ICD-10-CM | POA: Insufficient documentation

## 2018-07-11 DIAGNOSIS — K219 Gastro-esophageal reflux disease without esophagitis: Secondary | ICD-10-CM | POA: Diagnosis not present

## 2018-07-11 DIAGNOSIS — Z79899 Other long term (current) drug therapy: Secondary | ICD-10-CM | POA: Diagnosis not present

## 2018-07-11 DIAGNOSIS — I132 Hypertensive heart and chronic kidney disease with heart failure and with stage 5 chronic kidney disease, or end stage renal disease: Secondary | ICD-10-CM | POA: Diagnosis not present

## 2018-07-11 DIAGNOSIS — I509 Heart failure, unspecified: Secondary | ICD-10-CM | POA: Insufficient documentation

## 2018-07-11 DIAGNOSIS — I959 Hypotension, unspecified: Secondary | ICD-10-CM | POA: Insufficient documentation

## 2018-07-11 DIAGNOSIS — Z992 Dependence on renal dialysis: Secondary | ICD-10-CM | POA: Insufficient documentation

## 2018-07-11 DIAGNOSIS — I480 Paroxysmal atrial fibrillation: Secondary | ICD-10-CM | POA: Diagnosis present

## 2018-07-11 DIAGNOSIS — Z8249 Family history of ischemic heart disease and other diseases of the circulatory system: Secondary | ICD-10-CM | POA: Insufficient documentation

## 2018-07-11 NOTE — Progress Notes (Signed)
Primary Care Physician: Lucianne Lei, MD Referring Physician: [facility] ER f/u Cardiologist: Dr. Mattie Marlin Milillo is a 63 y.o. female with a h/o ESRD on dialysis, failed transplant in 2016, HTN in the past but not treated for HTN currently,that has been having a  spells of palpitations during dialysis. Thuis is mentioned in her chart back to 2017. She was seen in the ED 11/24/15 and 12 /14/18 for same. She converted in the ER after just a few mins after arrival, ekg appeared to be be flutter. Ekg seen form 11/24/2015 flutter vrs fib. She is on ASA for a chadsvasc score of 1. She is on metoprolol 25 mg at hs only because her BP drops with dialysis if she takes her am dose. She has only had the arrhythmia following with dialysis.  F/u in afib clinic from ER, 12/20, she was given prn 30 mg Cardizem to use during dialysis and she did use once during dialysis but did not seem to help. Her nephrologist did mention that she could try 1/2 tab of 30 mg Cardizem prior to starting dialysis.I did talk to Dr. Rayann Heman and he is willing to consider primary ablation. She does have normal heart function by echo in 2017.   She saw Dr.Allred early this year and was offered ablation but she declined. 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 Now that she is post op several weeks, her BP's are running low. When she saw the kidney surgeon a few days ago, he mentioned for her to come here and get  her meds adjusted. During this time, she has not noted any afib, it has been quiet.  F/u afib clinic 5/9, on lat visit , metoprolol was adjusted as  well as Cardizem. She reports no afib and BP has been running much better, not so low.  Feels improved. She is currently happy with management.  F/u in afib clinic, 8/8, she  unfortunately was diagnosed in June of this year with rt breast CA and is undergoing chemotherapy. She will be reassessed after chemo  for next step but pt thinks she will have a double mastectomy. She has not had any issues with afib.  F/U afib clinic 02/07/18. She is s/p right mastectomy, currently on chemotherapy. 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 She has not had any heart racing or palpitation symptoms. She continues to have issues with hypotension after dialysis.    F/u in afib clinic, 625/20. She is almost thru with chemo  for breast ca, 2 more treatments for which she is very happy about. She is not having any afib. She is having low blood pressure associated with dialysis. One of the nephrologists adjusted her dry weight this week to offset this. She is not symptomatic with her BP of 86/52 today. She is taking 30 mg of Cardizem after dialysis to ward off afib as she was having issues for awhile with afib right after dialysis.  Today, she denies symptoms of palpitations, chest pain, shortness of breath, orthopnea, PND, lower extremity edema, dizziness, presyncope, syncope, or neurologic sequela. The patient is tolerating medications without difficulties and is otherwise without complaint today.   Past Medical History:  Diagnosis Date  . Anemia   .  Arthritis    knees  . Breast cancer ([place]) 2019   Right Breast Cancer  . Cancer ([place])   . Chest pain Jan 2016   low risk Myoview   . CHF (congestive heart failure) ([place])   . Complication of anesthesia 2005   difficulty remembering for a while and waking up  . Constipation   . Dysrhythmia    h/o A-Fib  . ESRD on dialysis [facility]) April 2016   MWF  . GERD (gastroesophageal reflux disease)   . Heart murmur Nov 2015   Aortic scleosis- no stenosis  . Hemodialysis patient ([place])   . Hypertension   . Shortness of breath dyspnea    with exertion   Past Surgical History:   Procedure Laterality Date  . ABDOMINAL HYSTERECTOMY  2005  . AV FISTULA PLACEMENT Left 12/09/2013   Procedure: INSERTION OF ARTERIOVENOUS (AV) GORE-TEX GRAFT ARM;  Surgeon: Elam Dutch, MD;  Location: [facility] OR;  Service: Vascular;  Laterality: Left;  . AV FISTULA PLACEMENT Right 01/01/2018   Procedure: INSERTION OF ARTERIOVENOUS (AV) GORE-TEX GRAFT ARM RIGHT ARM;  Surgeon: Elam Dutch, MD;  Location: [place];  Service: Vascular;  Laterality: Right;  . BREAST BIOPSY  1990's  . BUNIONECTOMY Bilateral   . CHOLECYSTECTOMY  2005  . COLONOSCOPY    . IR GENERIC HISTORICAL  01/11/2016   IR US GUIDE VASC ACCESS RIGHT 01/11/2016 Corrie Mckusick, DO [facility]-INTER[facility]  . IR GENERIC HISTORICAL  01/11/2016   IR RADIOLOGY PERIPHERAL GUIDED IV START 01/11/2016 Corrie Mckusick, DO [facility]INTER[facility]  . IR IMAGING GUIDED PORT INSERTION  07/05/2017  . 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 KNEE ARTHROSCOPY Bilateral   . MASTECTOMY W/ SENTINEL NODE BIOPSY Right 11/20/2017  . MASTECTOMY W/ SENTINEL NODE BIOPSY Right 11/20/2017   Procedure: RIGHT MASTECTOMY WITH SENTINEL LYMPH NODE BIOPSY;  Surgeon: Coralie Keens, MD;  Location: [place];  Service: General;  Laterality: Right;  . PERIPHERAL VASCULAR CATHETERIZATION N/A 08/31/2015   Procedure: A/V Shuntogram;  Surgeon: Serafina Mitchell, MD;  Location: [place] [facility];  Service: Cardiovascular;  Laterality: N/A;  . PERIPHERAL VASCULAR CATHETERIZATION Left 08/31/2015   Procedure: Peripheral Vascular Balloon Angioplasty;  Surgeon: Serafina Mitchell, MD;  Location: [place] [facility];  Service: Cardiovascular;  Laterality: Left;  arm fistula  . ROTATOR CUFF REPAIR Right 1997  . UPPER EXTREMITY VENOGRAPHY Right 12/24/2017   Procedure: UPPER EXTREMITY VENOGRAPHY CENTRAL VENOGRAM;  Surgeon: Waynetta Sandy, MD;  Location: [place] [facility];  Service: Cardiovascular;  Laterality: Right;    Current Outpatient Medications  Medication Sig Dispense Refill  .  acetaminophen (TYLENOL) 500 MG tablet Take 1,000 mg by mouth daily as needed for mild pain, moderate pain or headache.     Marland Kitchen amoxicillin (AMOXIL) 500 MG capsule Take 2,000 mg by mouth See admin instructions. Take 4 capsules (2000 mg) by mouth one hour prior to dental procedures    . clobetasol cream (TEMOVATE) 0.56 % Apply 1 application topically as needed.     . diltiazem (CARDIZEM) 30 MG tablet TAKE 1/2 TABLET EVERY 4 HOURS AS NEEDED FOR AFIB HEART RATE OVER 100 45 tablet 1  . lidocaine-prilocaine (EMLA) cream Apply 1 application topically See admin instructions. Apply topically Monday, Wednesday and Friday before dialysis  6  . multivitamin (RENA-VIT) TABS tablet Take 1 tablet by mouth daily.    . pantoprazole (PROTONIX) 40 MG tablet TAKE 1 TABLET (40 MG TOTAL) BY MOUTH DAILY. [place]  tablet 1  . sevelamer carbonate (RENVELA) 800 MG tablet Take 800-1,600 mg by mouth See admin instructions. 1600mg  three times daily and 800mg  twice daily with snacks    . simvastatin (ZOCOR) 20 MG tablet TAKE 1 TABLET BY MOUTH EVERYDAY AT BEDTIME (Patient taking differently: Take 20 mg by mouth at bedtime. ) 90 tablet 1   No current facility-administered medications for this encounter.     No Known Allergies  Social History   Socioeconomic History  . Marital status: Married    Spouse name: Not on file  . Number of children: 2  . Years of education: Not on file  . Highest education level: Not on file  Occupational History  . Occupation: [employer] OFFICE    Employer: [employer]  Social Needs  . Financial resource strain: Not on file  . Food insecurity    Worry: Not on file    Inability: Not on file  . Transportation needs    Medical: Not on file    Non-medical: Not on file  Tobacco Use  . Smoking status: Never Smoker  . Smokeless tobacco: Never Used  Substance and Sexual Activity  . Alcohol use: No  . Drug use: No  . Sexual activity: Not on file    Comment: Hysterectomy  Lifestyle  . Physical  activity    Days per week: Not on file    Minutes per session: Not on file  . Stress: Not on file  Relationships  . Social Herbalist on phone: Not on file    Gets together: Not on file    Attends religious service: Not on file    Active member of club or organization: Not on file    Attends meetings of clubs or organizations: Not on file    Relationship status: Not on file  . Intimate partner violence    Fear of current or ex partner: Not on file    Emotionally abused: Not on file    Physically abused: Not on file    Forced sexual activity: Not on file  Other Topics Concern  . Not on file  Social History Narrative  . Not on file    Family History  Problem Relation Age of Onset  . Kidney disease Mother   . Heart attack Father   . Kidney disease Father   . Diabetes Sister   . Hyperlipidemia Sister   . Hypertension Sister   . Kidney disease Sister        x2    ROS- All systems are reviewed and negative except as per the HPI above  Physical Exam: Vitals:   07/11/18 1449  BP: (!) 86/52  Pulse: 93  Weight: 87.5 kg  Height: 5\' 1"  (1.549 m)   Wt Readings from Last 3 Encounters:  07/11/18 87.5 kg  06/25/18 87.7 kg  06/04/18 86.6 kg    Labs: Lab Results  Component Value Date   NA 142 06/25/2018   K 4.8 06/25/2018   CL 100 06/25/2018   CO2 26 06/25/2018   GLUCOSE 111 (H) 06/25/2018   BUN 33 (H) 06/25/2018   CREATININE 7.18 (HH) 06/25/2018   CALCIUM 8.3 (L) 06/25/2018   PHOS 3.5 01/08/2018   MG 2.1 11/24/2015   Lab Results  Component Value Date   INR 1.00 07/05/2017   Lab Results  Component Value Date   CHOL 165 06/08/2015   HDL 59 06/08/2015   LDLCALC 97 06/08/2015   TRIG 44 06/08/2015  GEN- The patient is well appearing, alert and oriented x 3 today.   HEENT-head normocephalic, atraumatic, sclera clear, conjunctiva pink, hearing intact, trachea midline. Lungs- Clear to ausculation bilaterally, normal work of breathing Heart- Regular  rate and rhythm, no murmurs, rubs or gallops  GI- soft, NT, ND, + BS Extremities- no clubbing, cyanosis, or edema MS- no significant deformity or atrophy Skin- no rash or lesion Psych- euthymic mood, full affect Neuro- strength and sensation are intact   EKG- NSR,  normal ekg  Epic records reviewed  Echo- 12/2017-Study Conclusions  - Left ventricle: The cavity size was normal. Systolic function was   normal. The estimated ejection fraction was in the range of 55%   to 60%. Wall motion was normal; there were no regional wall   motion abnormalities. Left ventricular diastolic function   parameters were normal. - Right atrium: Mobile calcified structur in RA not well visualized   may be calcified chiari network. - Atrial septum: No defect or patent foramen ovale was identified. - Pulmonary arteries: PA peak pressure: 33 mm Hg (S).    Assessment and Plan: 1. Paroxysmal afib/? flutter associated with dialysis No afib to report, very quiet for some time She would only be a candidate for amiodarone as far as AAD's 2/2 ESRD Offered ablation in the past but she declined  For now, anticoagulation not indicated for chadsvasc score of 1(female) Continue 1/2 of 30 mg  after dialysis. BP stable today.  2. Hypotension Related to dialysis, not symptomatic She is not on any meds that wouldcontribute  3. Breast CA Per oncology  Follow up in afib as needed.. Follow up with Dr Gwenlyn Found as scheduled.   Geroge Baseman Francisca Harbuck, [facility] [address] [place], [place] [address] [phone]

## 2018-07-15 ENCOUNTER — Other Ambulatory Visit: Payer: Self-pay | Admitting: Cardiology

## 2018-07-15 NOTE — Progress Notes (Signed)
Patient Care Team: Lucianne Lei, MD as PCP - General (Family Medicine) Lorretta Harp, MD as PCP - Cardiology (Cardiology) Nicholas Lose, MD as PCP - Hematology/Oncology (Hematology and Oncology) Donato Heinz, MD (Nephrology) Lorretta Harp, MD as Consulting Physician (Cardiology) Pyrtle, Lajuan Lines, MD as Consulting Physician (Gastroenterology) Center, [place], Dawn C, RN as Oncology Nurse Navigator Rockwell Germany, RN as Oncology Nurse Navigator  DIAGNOSIS:    ICD-10-CM   1. Malignant neoplasm of upper-outer quadrant of right breast in female, estrogen receptor negative ([place])  C50.411    Z17.1     SUMMARY OF ONCOLOGIC HISTORY: Oncology History  Malignant neoplasm of upper-outer quadrant of right breast in female, estrogen receptor negative ([place])  06/19/2017 Initial Diagnosis   Right breast skin thickening and heaviness: Screening mammogram detected 2 cm of microcalcifications in the right breast in the posterior third UOQ.?  Skin involvement, stereotactic biopsy revealed IDC grade 2-3 with high-grade DCIS, lymphovascular invasion present, ER 0%, PR 0%, HER-2 positive ratio 8.69, gene copy #11.3 T1CN0 stage Ia   07/02/2017 Cancer Staging   Staging form: Breast, AJCC 8th Edition - Clinical: Stage IA (cT1c, cN0, cM0, G3, ER-, PR-, HER2+) - Signed by Nicholas Lose, MD on 07/02/2017   07/17/2017 - 09/25/2017 Chemotherapy   Taxol Herceptin weekly x12 (07/17/17-09/25/17) followed by Herceptin maintenance x1 year   11/20/2017 Surgery   Right mastectomy: No residual invasive cancer.  0/1 lymph node negative ER: 0%, negative, PR: 0%, negative, Her2: Positive, ratio 8.69. Ki-67: 30%     CHIEF COMPLIANT: Follow-up of maintenance Herceptin  INTERVAL HISTORY: Karen [name] is a 63 y.o. with above-mentioned history of right breast cancer treated with neoadjuvantchemotherapy with Taxol,Herceptinand rightmastectomywho is currently on maintenance Herceptin  therapy. Left breast screening mammogram on 06/20/18 showed no evidence of malignancy. She presents to the clinic today for treatment.  She continues to have trouble whenever she gets Herceptin her dialysis catheter stopped working.  REVIEW OF SYSTEMS:   Constitutional: Denies fevers, chills or abnormal weight loss, generalized fatigue Eyes: Denies blurriness of vision Ears, nose, mouth, throat, and face: Denies mucositis or sore throat Respiratory: Denies cough, dyspnea or wheezes Cardiovascular: Denies palpitation, chest discomfort Gastrointestinal: Denies nausea, heartburn or change in bowel habits Skin: Denies abnormal skin rashes Lymphatics: Denies new lymphadenopathy or easy bruising Neurological: Denies numbness, tingling or new weaknesses Behavioral/Psych: Mood is stable, no new changes  Extremities: No lower extremity edema Breast: denies any pain or lumps or nodules in either breasts All other systems were reviewed with the patient and are negative.  I have reviewed the past medical history, past surgical history, social history and family history with the patient and they are unchanged from previous note.  ALLERGIES:  has No Known Allergies.  MEDICATIONS:  Current Outpatient Medications  Medication Sig Dispense Refill   acetaminophen (TYLENOL) 500 MG tablet Take 1,000 mg by mouth daily as needed for mild pain, moderate pain or headache.      amoxicillin (AMOXIL) 500 MG capsule Take 2,000 mg by mouth See admin instructions. Take 4 capsules (2000 mg) by mouth one hour prior to dental procedures     clobetasol cream (TEMOVATE) 9.73 % Apply 1 application topically as needed.      diltiazem (CARDIZEM) 30 MG tablet TAKE 1/2 TABLET EVERY 4 HOURS AS NEEDED FOR AFIB HEART RATE OVER 100 45 tablet 1   lidocaine-prilocaine (EMLA) cream Apply 1 application topically See admin instructions. Apply topically Monday, Wednesday and Friday  before dialysis  6   multivitamin (RENA-VIT) TABS  tablet Take 1 tablet by mouth daily.     pantoprazole (PROTONIX) 40 MG tablet TAKE 1 TABLET (40 MG TOTAL) BY MOUTH DAILY. 90 tablet 1   sevelamer carbonate (RENVELA) 800 MG tablet Take 800-1,600 mg by mouth See admin instructions. 164m three times daily and 8081mtwice daily with snacks     simvastatin (ZOCOR) 20 MG tablet TAKE 1 TABLET BY MOUTH EVERYDAY AT BEDTIME 90 tablet 2   No current facility-administered medications for this visit.     PHYSICAL EXAMINATION: ECOG PERFORMANCE STATUS: 1 - Symptomatic but completely ambulatory  Vitals:   07/16/18 0848  BP: 97/64  Pulse: 82  Resp: 16  Temp: 98.5 F (36.9 C)  SpO2: 100%   Filed Weights   07/16/18 0848  Weight: 191 lb 14.4 oz (87 kg)    Physical exam not done due to COVID-19 precautions LABORATORY DATA:  I have reviewed the data as listed CMP Latest Ref Rng & Units 06/25/2018 06/04/2018 05/14/2018  Glucose 70 - 99 mg/dL 111(H) 91 90  BUN 8 - 23 mg/dL 33(H) 36(H) 32(H)  Creatinine 0.44 - 1.00 mg/dL 7.18(HH) 7.13(HH) 7.12(HH)  Sodium 135 - 145 mmol/L 142 141 144  Potassium 3.5 - 5.1 mmol/L 4.8 4.0 3.6  Chloride 98 - 111 mmol/L 100 99 100  CO2 22 - 32 mmol/L _0 Calcium 8.9 - 10.3 mg/dL 8.3(L) 8.7(L) 8.4(L)  Total Protein 6.5 - 8.1 g/dL 7.6 7.5 7.2  Total Bilirubin 0.3 - 1.2 mg/dL 0.4 0.4 0.4  Alkaline Phos 38 - 126 U/L 184(H) 214(H) 206(H)  AST 15 - 41 U/L 15 13(L) 14(L)  ALT 0 - 44 U/L _1 Lab Results  Component Value Date   WBC 4.3 07/16/2018   HGB 9.9 (L) 07/16/2018   HCT 30.5 (L) 07/16/2018   MCV 101.3 (H) 07/16/2018   PLT 180 07/16/2018   NEUTROABS 2.6 07/16/2018    ASSESSMENT & PLAN:  Malignant neoplasm of upper-outer quadrant of right breast in female, estrogen receptor negative ([place]6/[place]/2017: Right breast skin thickening and heaviness: Screening mammogram detected 2 cm of microcalcifications in the right breast in the posterior third UOQ.? Skin involvement, stereotactic biopsy revealed IDC  grade 2-3 with high-grade DCIS, lymphovascular invasion present, ER 0%, PR 0%, HER-2 positive ratio 8.69, gene copy #11.3  Neoadjuvant chemotherapy with Taxol Herceptin x10  11/20/2017:Right mastectomy: No residual invasive cancer. 0/1 lymph node negative ER: 0%, negative, PR: 0%, negative, Her2: Positive, ratio 8.69. 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 Echo3/9/2020shows EF of 60 to 65%  left breastmammogram scheduled for 06/20/2018 Nail dyscrasia improving End-stage kidney disease on hemodialysis.  Every time she received Herceptin the graft appeared to be stopping.  She wonders if it is related to hypotension during the treatments.  I do not suspect that however it is still unexplained.  She now has a dialysis catheter.    Anemia due to chronic kidney disease: Hemoglobin is stable at 9.4 today. Patient completes Herceptin on 08/06/2018. I sent a message to Dr. BlNinfa Lindeno remove the port. Return to clinic in 6 months for follow-up.    No orders of the defined types were placed in this encounter.  The patient has a good understanding of the overall plan. she agrees with it. she will call with any problems that may  develop before the next visit here.  Nicholas Lose, MD 07/16/2018  Julious Oka Dorshimer am acting as scribe for Dr. Nicholas Lose.  I have reviewed the above documentation for accuracy and completeness, and I agree with the above.

## 2018-07-16 ENCOUNTER — Encounter: Payer: Self-pay | Admitting: *Deleted

## 2018-07-16 ENCOUNTER — Other Ambulatory Visit: Payer: Self-pay

## 2018-07-16 ENCOUNTER — Inpatient Hospital Stay: Payer: Medicare Other

## 2018-07-16 ENCOUNTER — Inpatient Hospital Stay (HOSPITAL_BASED_OUTPATIENT_CLINIC_OR_DEPARTMENT_OTHER): Payer: Medicare Other | Admitting: Hematology and Oncology

## 2018-07-16 DIAGNOSIS — N186 End stage renal disease: Secondary | ICD-10-CM | POA: Diagnosis not present

## 2018-07-16 DIAGNOSIS — C50411 Malignant neoplasm of upper-outer quadrant of right female breast: Secondary | ICD-10-CM

## 2018-07-16 DIAGNOSIS — Z171 Estrogen receptor negative status [ER-]: Secondary | ICD-10-CM

## 2018-07-16 DIAGNOSIS — D631 Anemia in chronic kidney disease: Secondary | ICD-10-CM

## 2018-07-16 DIAGNOSIS — Z79899 Other long term (current) drug therapy: Secondary | ICD-10-CM

## 2018-07-16 DIAGNOSIS — Z95828 Presence of other vascular implants and grafts: Secondary | ICD-10-CM

## 2018-07-16 DIAGNOSIS — Z5112 Encounter for antineoplastic immunotherapy: Secondary | ICD-10-CM | POA: Diagnosis not present

## 2018-07-16 DIAGNOSIS — Z9221 Personal history of antineoplastic chemotherapy: Secondary | ICD-10-CM

## 2018-07-16 DIAGNOSIS — Z9011 Acquired absence of right breast and nipple: Secondary | ICD-10-CM

## 2018-07-16 LAB — CBC WITH DIFFERENTIAL (CANCER CENTER ONLY)
Abs Immature Granulocytes: 0.01 10*3/uL (ref 0.00–0.07)
Basophils Absolute: 0.1 10*3/uL (ref 0.0–0.1)
Basophils Relative: 1 %
Eosinophils Absolute: 0.4 10*3/uL (ref 0.0–0.5)
Eosinophils Relative: 9 %
HCT: 30.5 % — ABNORMAL LOW (ref 36.0–46.0)
Hemoglobin: 9.9 g/dL — ABNORMAL LOW (ref 12.0–15.0)
Immature Granulocytes: 0 %
Lymphocytes Relative: 19 %
Lymphs Abs: 0.8 10*3/uL (ref 0.7–4.0)
MCH: 32.9 pg (ref 26.0–34.0)
MCHC: 32.5 g/dL (ref 30.0–36.0)
MCV: 101.3 fL — ABNORMAL HIGH (ref 80.0–100.0)
Monocytes Absolute: 0.5 10*3/uL (ref 0.1–1.0)
Monocytes Relative: 10 %
Neutro Abs: 2.6 10*3/uL (ref 1.7–7.7)
Neutrophils Relative %: 61 %
Platelet Count: 180 10*3/uL (ref 150–400)
RBC: 3.01 MIL/uL — ABNORMAL LOW (ref 3.87–5.11)
RDW: 13.5 % (ref 11.5–15.5)
WBC Count: 4.3 10*3/uL (ref 4.0–10.5)
nRBC: 0 % (ref 0.0–0.2)

## 2018-07-16 LAB — CMP (CANCER CENTER ONLY)
ALT: 11 U/L (ref 0–44)
AST: 15 U/L (ref 15–41)
Albumin: 3.6 g/dL (ref 3.5–5.0)
Alkaline Phosphatase: 189 U/L — ABNORMAL HIGH (ref 38–126)
Anion gap: 14 (ref 5–15)
BUN: 28 mg/dL — ABNORMAL HIGH (ref 8–23)
CO2: 29 mmol/L (ref 22–32)
Calcium: 8.3 mg/dL — ABNORMAL LOW (ref 8.9–10.3)
Chloride: 99 mmol/L (ref 98–111)
Creatinine: 7.08 mg/dL (ref 0.44–1.00)
GFR, Est AFR Am: 7 mL/min — ABNORMAL LOW (ref >60)
GFR, Estimated: 6 mL/min — ABNORMAL LOW (ref >60)
Glucose, Bld: 94 mg/dL (ref 70–99)
Potassium: 4.3 mmol/L (ref 3.5–5.1)
Sodium: 142 mmol/L (ref 135–145)
Total Bilirubin: 0.4 mg/dL (ref 0.3–1.2)
Total Protein: 7.6 g/dL (ref 6.5–8.1)

## 2018-07-16 MED ORDER — SODIUM CHLORIDE 0.9% FLUSH
10.0000 mL | INTRAVENOUS | Status: DC | PRN
  Administered 2018-07-16: 10 mL
  Filled 2018-07-16: qty 10

## 2018-07-16 MED ORDER — DIPHENHYDRAMINE HCL 25 MG PO CAPS
ORAL_CAPSULE | ORAL | Status: AC
  Filled 2018-07-16: qty 2

## 2018-07-16 MED ORDER — SODIUM CHLORIDE 0.9 % IV SOLN
Freq: Once | INTRAVENOUS | Status: AC
  Administered 2018-07-16: 10:00:00 via INTRAVENOUS
  Filled 2018-07-16: qty 250

## 2018-07-16 MED ORDER — ACETAMINOPHEN 325 MG PO TABS
650.0000 mg | ORAL_TABLET | Freq: Once | ORAL | Status: AC
  Administered 2018-07-16: 650 mg via ORAL

## 2018-07-16 MED ORDER — HEPARIN SOD (PORK) LOCK FLUSH 100 UNIT/ML IV SOLN
500.0000 [IU] | Freq: Once | INTRAVENOUS | Status: AC | PRN
  Administered 2018-07-16: 500 [IU]
  Filled 2018-07-16: qty 5

## 2018-07-16 MED ORDER — DIPHENHYDRAMINE HCL 25 MG PO CAPS
50.0000 mg | ORAL_CAPSULE | Freq: Once | ORAL | Status: AC
  Administered 2018-07-16: 10:00:00 50 mg via ORAL

## 2018-07-16 MED ORDER — TRASTUZUMAB CHEMO 150 MG IV SOLR
6.0000 mg/kg | Freq: Once | INTRAVENOUS | Status: AC
  Administered 2018-07-16: 11:00:00 525 mg via INTRAVENOUS
  Filled 2018-07-16: qty 25

## 2018-07-16 MED ORDER — ACETAMINOPHEN 325 MG PO TABS
ORAL_TABLET | ORAL | Status: AC
  Filled 2018-07-16: qty 2

## 2018-07-16 NOTE — Patient Instructions (Addendum)
[facility] Discharge Instructions for Patients Receiving Chemotherapy  Today you received the following Immunotherapy:  Herceptin  To help prevent nausea and vomiting after your treatment, we encourage you to take your nausea medication as directed by your MD.   If you develop nausea and vomiting that is not controlled by your nausea medication, call the clinic.   BELOW ARE SYMPTOMS THAT SHOULD BE REPORTED IMMEDIATELY:  *FEVER GREATER THAN 100.5 F  *CHILLS WITH OR WITHOUT FEVER  NAUSEA AND VOMITING THAT IS NOT CONTROLLED WITH YOUR NAUSEA MEDICATION  *UNUSUAL SHORTNESS OF BREATH  *UNUSUAL BRUISING OR BLEEDING  TENDERNESS IN MOUTH AND THROAT WITH OR WITHOUT PRESENCE OF ULCERS  *URINARY PROBLEMS  *BOWEL PROBLEMS  UNUSUAL RASH Items with * indicate a potential emergency and should be followed up as soon as possible.  Feel free to call the clinic should you have any questions or concerns. The clinic phone number is [phone].  Please show the CHEMO ALERT CARD at check-in to the Emergency Department and triage nurse.  Coronavirus (COVID-19) Are you at risk?  Are you at risk for the Coronavirus (COVID-19)?  To be considered HIGH RISK for Coronavirus (COVID-19), you have to meet the following criteria:  . Traveled to [place], [place], [place], [place] or [place]; or in the [place] to [place], [place], [place], or [place]; and have fever, cough, and shortness of breath within the last 2 weeks of travel OR . Been in close contact with a person diagnosed with COVID-19 within the last 2 weeks and have fever, cough, and shortness of breath . IF YOU DO NOT MEET THESE CRITERIA, YOU ARE CONSIDERED LOW RISK FOR COVID-19.  What to do if you are HIGH RISK for COVID-19?  . If you are having a medical emergency, call 911. . Seek medical care right away. Before you go to a doctor's office, urgent care or emergency department, call ahead and tell them about  your recent travel, contact with someone diagnosed with COVID-19, and your symptoms. You should receive instructions from your physician's office regarding next steps of care.  . When you arrive at healthcare provider, tell the healthcare staff immediately you have returned from visiting [place], [place], [place], [place] or [place]; or traveled in the [place] to [place], [place], [place], or [place]; in the last two weeks or you have been in close contact with a person diagnosed with COVID-19 in the last 2 weeks.   . Tell the health care staff about your symptoms: fever, cough and shortness of breath. . After you have been seen by a medical provider, you will be either: o Tested for (COVID-19) and discharged home on quarantine except to seek medical care if symptoms worsen, and asked to  - Stay home and avoid contact with others until you get your results (4-5 days)  - Avoid travel on public transportation if possible (such as bus, train, or airplane) or o Sent to the Emergency Department by EMS for evaluation, COVID-19 testing, and possible admission depending on your condition and test results.  What to do if you are LOW RISK for COVID-19?  Reduce your risk of any infection by using the same precautions used for avoiding the common cold or flu:  . Wash your hands often with soap and warm water for at least 20 seconds.  If soap and water are not readily available, use an alcohol-based hand sanitizer with at least 60% alcohol.  . If   coughing or sneezing, cover your mouth and nose by coughing or sneezing into the elbow areas of your shirt or coat, into a tissue or into your sleeve (not your hands). . Avoid shaking hands with others and consider head nods or verbal greetings only. . Avoid touching your eyes, nose, or mouth with unwashed hands.  . Avoid close contact with people who are sick. . Avoid places or events with large numbers of people in one location, like concerts or sporting  events. . Carefully consider travel plans you have or are making. . If you are planning any travel outside or inside the [place], visit the CDC's Travelers' Health webpage for the latest health notices. . If you have some symptoms but not all symptoms, continue to monitor at home and seek medical attention if your symptoms worsen. . If you are having a medical emergency, call 911.   ADDITIONAL HEALTHCARE OPTIONS FOR PATIENTS  [facility] / e-Visit: [email or web address]         [facility]: [phone]  [facility]: [phone]                   [facility]: [phone]    

## 2018-07-17 ENCOUNTER — Telehealth: Payer: Self-pay | Admitting: Hematology and Oncology

## 2018-07-17 NOTE — Telephone Encounter (Signed)
I talk with patient regarding schedule  

## 2018-07-18 ENCOUNTER — Other Ambulatory Visit: Payer: Self-pay | Admitting: Surgery

## 2018-07-23 ENCOUNTER — Encounter: Payer: Self-pay | Admitting: *Deleted

## 2018-07-29 ENCOUNTER — Telehealth: Payer: Self-pay | Admitting: *Deleted

## 2018-07-29 NOTE — Telephone Encounter (Signed)
Called pt and confirmed appt for final herceptin on 7/21. Pt informed, port will be removed on 7/27. Denies further needs or questions. Encourage pt to call with concerns. Received verbal understanding.

## 2018-08-01 ENCOUNTER — Inpatient Hospital Stay (HOSPITAL_COMMUNITY)
Admission: RE | Admit: 2018-08-01 | Discharge: 2018-08-01 | Disposition: A | Discharge status: Home / Self Care | Payer: Medicare Other | Source: Ambulatory Visit

## 2018-08-01 NOTE — Progress Notes (Signed)
Called pt because she had not arrived for her PAT appt. She states she had it down for another day. I told her that someone from scheduling will call and try to reschedule her appt.

## 2018-08-01 NOTE — Pre-Procedure Instructions (Signed)
Karen [name]  08/01/2018    Your procedure is scheduled on Monday, August 12, 2018 at 7:30 AM.   Report to [facility] Entrance "A" Admitting Office at 5:30 AM.   Call this number if you have problems the morning of surgery: [phone]   Questions prior to day of surgery, please call [phone] between 8 & 4 PM.   Remember:  Do not eat food after midnight Sunday, 08/11/18  You may drink clear liquids until 4:30 AM .  Clear liquids allowed are:  Water, Gatorade, clear juice (Non-citric, non-pulp), clear tea, black coffee, plain popsicles, plain jello, carbonated beverages              Take these medicines the morning of surgery with A SIP OF WATER: Diltiazem (Cardizem), Pantoprazole (Protonix), Acetaminophen (Tylenol) - if needed.  Stop Multivitamins 7 days prior to surgery. Do not use NSAIDS (Ibuprofen, Naprosyn, Aleve, etc), Aspirin products (BC Powders, Goody's, etc) or Herbal medications prior to surgery.    Do not wear jewelry, make-up or nail polish.  Do not wear lotions, powders, perfumes or deodorant.  Do not shave 48 hours prior to surgery.    Do not bring valuables to the hospital.  [facility] is not responsible for any belongings or valuables.  Contacts, dentures or bridgework may not be worn into surgery.  Leave your suitcase in the car.  After surgery it may be brought to your room.  For patients admitted to the hospital, discharge time will be determined by your treatment team.  Patients discharged the day of surgery will not be allowed to drive home.   [facility] - Preparing for Surgery  Before surgery, you can play an important role.  Because skin is not sterile, your skin needs to be as free of germs as possible.  You can reduce the number of germs on you skin by washing with CHG (chlorahexidine gluconate) soap before surgery.  CHG is an antiseptic cleaner which kills germs and bonds with the skin to continue killing germs even after washing.  Oral  Hygiene is also important in reducing the risk of infection.  Remember to brush your teeth with your regular toothpaste the morning of surgery.  Please DO NOT use if you have an allergy to CHG or antibacterial soaps.  If your skin becomes reddened/irritated stop using the CHG and inform your nurse when you arrive at Short Stay.  Do not shave (including legs and underarms) for at least 48 hours prior to the first CHG shower.  You may shave your face.  Please follow these instructions carefully:   1.  Shower with CHG Soap the night before surgery and the morning of Surgery.  2.  If you choose to wash your hair, wash your hair first as usual with your normal shampoo.  3.  After you shampoo, rinse your hair and body thoroughly to remove the shampoo. 4.  Use CHG as you would any other liquid soap.  You can apply chg directly to the skin and wash gently with a      scrungie or washcloth.           5.  Apply the CHG Soap to your body ONLY FROM THE NECK DOWN.   Do not use on open wounds or open sores. Avoid contact with your eyes, ears, mouth and genitals (private parts).  Wash genitals (private parts) with your normal soap.  6.  Wash thoroughly, paying special attention to the area where your  surgery will be performed.  7.  Thoroughly rinse your body with warm water from the neck down.  8.  DO NOT shower/wash with your normal soap after using and rinsing off the CHG Soap.  9.  Pat yourself dry with a clean towel.            10.  Wear clean pajamas.            11.  Place clean sheets on your bed the night of your first shower and do not sleep with pets.  Day of Surgery  Shower as above. Do not apply any lotions/deodorants the morning of surgery.   Please wear clean clothes to the hospital. Remember to brush your teeth with toothpaste.   Please read over the fact sheets that you were given.

## 2018-08-06 ENCOUNTER — Other Ambulatory Visit: Payer: Self-pay

## 2018-08-06 ENCOUNTER — Inpatient Hospital Stay: Payer: Medicare Other

## 2018-08-06 ENCOUNTER — Inpatient Hospital Stay: Payer: Medicare Other | Attending: Hematology and Oncology

## 2018-08-06 ENCOUNTER — Encounter: Payer: Self-pay | Admitting: *Deleted

## 2018-08-06 ENCOUNTER — Telehealth: Payer: Self-pay | Admitting: Hematology and Oncology

## 2018-08-06 VITALS — BP 107/64 | HR 77 | Temp 98.9°F | Resp 16 | Wt 195.0 lb

## 2018-08-06 DIAGNOSIS — C50411 Malignant neoplasm of upper-outer quadrant of right female breast: Secondary | ICD-10-CM | POA: Insufficient documentation

## 2018-08-06 DIAGNOSIS — Z171 Estrogen receptor negative status [ER-]: Secondary | ICD-10-CM

## 2018-08-06 DIAGNOSIS — Z95828 Presence of other vascular implants and grafts: Secondary | ICD-10-CM

## 2018-08-06 DIAGNOSIS — Z5112 Encounter for antineoplastic immunotherapy: Secondary | ICD-10-CM | POA: Insufficient documentation

## 2018-08-06 LAB — CMP (CANCER CENTER ONLY)
ALT: 13 U/L (ref 0–44)
AST: 14 U/L — ABNORMAL LOW (ref 15–41)
Albumin: 3.4 g/dL — ABNORMAL LOW (ref 3.5–5.0)
Alkaline Phosphatase: 207 U/L — ABNORMAL HIGH (ref 38–126)
Anion gap: 14 (ref 5–15)
BUN: 31 mg/dL — ABNORMAL HIGH (ref 8–23)
CO2: 28 mmol/L (ref 22–32)
Calcium: 8.5 mg/dL — ABNORMAL LOW (ref 8.9–10.3)
Chloride: 100 mmol/L (ref 98–111)
Creatinine: 7.25 mg/dL (ref 0.44–1.00)
GFR, Est AFR Am: 6 mL/min — ABNORMAL LOW (ref >60)
GFR, Estimated: 5 mL/min — ABNORMAL LOW (ref >60)
Glucose, Bld: 91 mg/dL (ref 70–99)
Potassium: 4.5 mmol/L (ref 3.5–5.1)
Sodium: 142 mmol/L (ref 135–145)
Total Bilirubin: 0.4 mg/dL (ref 0.3–1.2)
Total Protein: 7.3 g/dL (ref 6.5–8.1)

## 2018-08-06 LAB — CBC WITH DIFFERENTIAL (CANCER CENTER ONLY)
Abs Immature Granulocytes: 0.02 10*3/uL (ref 0.00–0.07)
Basophils Absolute: 0.1 10*3/uL (ref 0.0–0.1)
Basophils Relative: 1 %
Eosinophils Absolute: 0.3 10*3/uL (ref 0.0–0.5)
Eosinophils Relative: 8 %
HCT: 30.4 % — ABNORMAL LOW (ref 36.0–46.0)
Hemoglobin: 9.6 g/dL — ABNORMAL LOW (ref 12.0–15.0)
Immature Granulocytes: 1 %
Lymphocytes Relative: 17 %
Lymphs Abs: 0.7 10*3/uL (ref 0.7–4.0)
MCH: 32 pg (ref 26.0–34.0)
MCHC: 31.6 g/dL (ref 30.0–36.0)
MCV: 101.3 fL — ABNORMAL HIGH (ref 80.0–100.0)
Monocytes Absolute: 0.5 10*3/uL (ref 0.1–1.0)
Monocytes Relative: 11 %
Neutro Abs: 2.6 10*3/uL (ref 1.7–7.7)
Neutrophils Relative %: 62 %
Platelet Count: 176 10*3/uL (ref 150–400)
RBC: 3 MIL/uL — ABNORMAL LOW (ref 3.87–5.11)
RDW: 13.8 % (ref 11.5–15.5)
WBC Count: 4.2 10*3/uL (ref 4.0–10.5)
nRBC: 0 % (ref 0.0–0.2)

## 2018-08-06 MED ORDER — DIPHENHYDRAMINE HCL 25 MG PO CAPS
ORAL_CAPSULE | ORAL | Status: AC
  Filled 2018-08-06: qty 2

## 2018-08-06 MED ORDER — ACETAMINOPHEN 325 MG PO TABS
ORAL_TABLET | ORAL | Status: AC
  Filled 2018-08-06: qty 2

## 2018-08-06 MED ORDER — DIPHENHYDRAMINE HCL 25 MG PO CAPS
50.0000 mg | ORAL_CAPSULE | Freq: Once | ORAL | Status: AC
  Administered 2018-08-06: 50 mg via ORAL

## 2018-08-06 MED ORDER — TRASTUZUMAB CHEMO 150 MG IV SOLR
6.0000 mg/kg | Freq: Once | INTRAVENOUS | Status: AC
  Administered 2018-08-06: 525 mg via INTRAVENOUS
  Filled 2018-08-06: qty 25

## 2018-08-06 MED ORDER — SODIUM CHLORIDE 0.9 % IV SOLN
Freq: Once | INTRAVENOUS | Status: AC
  Administered 2018-08-06: 09:00:00 via INTRAVENOUS
  Filled 2018-08-06: qty 250

## 2018-08-06 MED ORDER — SODIUM CHLORIDE 0.9% FLUSH
10.0000 mL | INTRAVENOUS | Status: DC | PRN
  Administered 2018-08-06: 10 mL
  Filled 2018-08-06: qty 10

## 2018-08-06 MED ORDER — ACETAMINOPHEN 325 MG PO TABS
650.0000 mg | ORAL_TABLET | Freq: Once | ORAL | Status: AC
  Administered 2018-08-06: 650 mg via ORAL

## 2018-08-06 MED ORDER — HEPARIN SOD (PORK) LOCK FLUSH 100 UNIT/ML IV SOLN
500.0000 [IU] | Freq: Once | INTRAVENOUS | Status: AC | PRN
  Administered 2018-08-06: 500 [IU]
  Filled 2018-08-06: qty 5

## 2018-08-06 NOTE — Telephone Encounter (Signed)
Scheduled appt per 7/21 sch message - pt to get an updated schedule in treatment area.

## 2018-08-06 NOTE — Patient Instructions (Signed)
[facility] Discharge Instructions for Patients Receiving Chemotherapy  Today you received the following chemotherapy agents Herceptin  To help prevent nausea and vomiting after your treatment, we encourage you to take your nausea medication as directed   If you develop nausea and vomiting that is not controlled by your nausea medication, call the clinic.   BELOW ARE SYMPTOMS THAT SHOULD BE REPORTED IMMEDIATELY:  *FEVER GREATER THAN 100.5 F  *CHILLS WITH OR WITHOUT FEVER  NAUSEA AND VOMITING THAT IS NOT CONTROLLED WITH YOUR NAUSEA MEDICATION  *UNUSUAL SHORTNESS OF BREATH  *UNUSUAL BRUISING OR BLEEDING  TENDERNESS IN MOUTH AND THROAT WITH OR WITHOUT PRESENCE OF ULCERS  *URINARY PROBLEMS  *BOWEL PROBLEMS  UNUSUAL RASH Items with * indicate a potential emergency and should be followed up as soon as possible.  Feel free to call the clinic should you have any questions or concerns. The clinic phone number is [phone].  Please show the CHEMO ALERT CARD at check-in to the Emergency Department and triage nurse.   

## 2018-08-07 ENCOUNTER — Inpatient Hospital Stay (HOSPITAL_COMMUNITY)
Admission: RE | Admit: 2018-08-07 | Discharge: 2018-08-07 | Disposition: A | Discharge status: Home / Self Care | Payer: Medicare Other | Source: Ambulatory Visit | Attending: Surgery | Admitting: Surgery

## 2018-08-07 NOTE — Progress Notes (Signed)
Pt forgot about her PAT appointment again today. Had it rescheduled for Fri at 3 pm. Pt was informed that this was the only available slot before her surgery that is scheduled for Monday 07/27.

## 2018-08-08 ENCOUNTER — Other Ambulatory Visit (HOSPITAL_COMMUNITY)
Admission: RE | Admit: 2018-08-08 | Discharge: 2018-08-08 | Disposition: A | Discharge status: Home / Self Care | Payer: Medicare Other | Source: Ambulatory Visit | Attending: Surgery | Admitting: Surgery

## 2018-08-08 DIAGNOSIS — Z1159 Encounter for screening for other viral diseases: Secondary | ICD-10-CM | POA: Diagnosis present

## 2018-08-08 LAB — SARS CORONAVIRUS 2 (TAT 6-24 HRS): SARS Coronavirus 2: NEGATIVE

## 2018-08-09 ENCOUNTER — Encounter (HOSPITAL_COMMUNITY)
Admission: RE | Admit: 2018-08-09 | Discharge: 2018-08-09 | Disposition: A | Discharge status: Home / Self Care | Payer: Medicare Other | Source: Ambulatory Visit | Attending: Surgery | Admitting: Surgery

## 2018-08-09 ENCOUNTER — Other Ambulatory Visit: Payer: Self-pay

## 2018-08-09 ENCOUNTER — Encounter (HOSPITAL_COMMUNITY): Payer: Self-pay

## 2018-08-09 DIAGNOSIS — M170 Bilateral primary osteoarthritis of knee: Secondary | ICD-10-CM | POA: Diagnosis not present

## 2018-08-09 DIAGNOSIS — K219 Gastro-esophageal reflux disease without esophagitis: Secondary | ICD-10-CM | POA: Diagnosis not present

## 2018-08-09 DIAGNOSIS — Z9221 Personal history of antineoplastic chemotherapy: Secondary | ICD-10-CM | POA: Diagnosis not present

## 2018-08-09 DIAGNOSIS — I4891 Unspecified atrial fibrillation: Secondary | ICD-10-CM | POA: Diagnosis not present

## 2018-08-09 DIAGNOSIS — Z992 Dependence on renal dialysis: Secondary | ICD-10-CM | POA: Diagnosis not present

## 2018-08-09 DIAGNOSIS — Z452 Encounter for adjustment and management of vascular access device: Secondary | ICD-10-CM | POA: Diagnosis present

## 2018-08-09 DIAGNOSIS — D649 Anemia, unspecified: Secondary | ICD-10-CM | POA: Diagnosis not present

## 2018-08-09 DIAGNOSIS — I2510 Atherosclerotic heart disease of native coronary artery without angina pectoris: Secondary | ICD-10-CM | POA: Diagnosis not present

## 2018-08-09 DIAGNOSIS — Z20828 Contact with and (suspected) exposure to other viral communicable diseases: Secondary | ICD-10-CM | POA: Diagnosis not present

## 2018-08-09 DIAGNOSIS — Z01812 Encounter for preprocedural laboratory examination: Secondary | ICD-10-CM | POA: Insufficient documentation

## 2018-08-09 DIAGNOSIS — Z853 Personal history of malignant neoplasm of breast: Secondary | ICD-10-CM | POA: Diagnosis not present

## 2018-08-09 DIAGNOSIS — I509 Heart failure, unspecified: Secondary | ICD-10-CM | POA: Diagnosis not present

## 2018-08-09 DIAGNOSIS — Z79899 Other long term (current) drug therapy: Secondary | ICD-10-CM | POA: Diagnosis not present

## 2018-08-09 DIAGNOSIS — D759 Disease of blood and blood-forming organs, unspecified: Secondary | ICD-10-CM | POA: Diagnosis not present

## 2018-08-09 DIAGNOSIS — R011 Cardiac murmur, unspecified: Secondary | ICD-10-CM | POA: Diagnosis not present

## 2018-08-09 DIAGNOSIS — I132 Hypertensive heart and chronic kidney disease with heart failure and with stage 5 chronic kidney disease, or end stage renal disease: Secondary | ICD-10-CM | POA: Diagnosis not present

## 2018-08-09 DIAGNOSIS — Z6836 Body mass index (BMI) 36.0-36.9, adult: Secondary | ICD-10-CM | POA: Diagnosis not present

## 2018-08-09 DIAGNOSIS — N186 End stage renal disease: Secondary | ICD-10-CM | POA: Diagnosis not present

## 2018-08-09 NOTE — Anesthesia Preprocedure Evaluation (Addendum)
Anesthesia Evaluation  Patient identified by MRN, date of birth, ID band Patient awake    Reviewed: Allergy & Precautions, NPO status , Patient's Chart, lab work & pertinent test results  History of Anesthesia Complications Negative for: history of anesthetic complications  Airway Mallampati: II  TM Distance: >3 FB Neck ROM: Full    Dental  (+) Dental Advisory Given, Missing, Poor Dentition   Pulmonary neg pulmonary ROS,  08/08/2018 SARS coronavirus NEG   breath sounds clear to auscultation       Cardiovascular hypertension, Pt. on medications (-) angina+ dysrhythmias Atrial Fibrillation  Rhythm:Regular Rate:Normal  03/2018 ECHO: EF 60-65%, valves OK   Neuro/Psych negative neurological ROS     GI/Hepatic Neg liver ROS, GERD  Medicated and Controlled,  Endo/Other  Morbid obesity  Renal/GU ESRF and DialysisRenal disease (MWF, K+ 4.3)     Musculoskeletal  (+) Arthritis ,   Abdominal (+) + obese,   Peds  Hematology  (+) Blood dyscrasia (Hb 9.6), anemia ,   Anesthesia Other Findings Breast cancer  Reproductive/Obstetrics                           Anesthesia Physical Anesthesia Plan  ASA: III  Anesthesia Plan: MAC   Post-op Pain Management:    Induction:   PONV Risk Score and Plan: 2 and Ondansetron and Treatment may vary due to age or medical condition  Airway Management Planned:   Additional Equipment:   Intra-op Plan:   Post-operative Plan:   Informed Consent: I have reviewed the patients History and Physical, chart, labs and discussed the procedure including the risks, benefits and alternatives for the proposed anesthesia with the patient or authorized representative who has indicated his/her understanding and acceptance.     Dental advisory given  Plan Discussed with: CRNA and Surgeon  Anesthesia Plan Comments: (Follows with Dr. Stanford Breed for hx of paroxysmal afib.  Admitted12/23/2019 with chest pain thought to be acute idiopathic pericarditis.  Her 2D echo was unremarkable.  Her d-dimer was mildly elevated but a CTA was negative for PE. Troponins were were negative. She was begun on ibuprofen and colchicine and her symptoms significantly improved.   Anemia stable, Hgb 9.6.  TTE 03/25/18:  1. The left ventricle has normal systolic function with an ejection fraction of 60-65%. The cavity size was normal. There is mildly increased left ventricular wall thickness. Left ventricular diastolic parameters were normal.  2. The right ventricle has normal systolic function. The cavity was normal. There is no increase in right ventricular wall thickness. Right ventricular systolic pressure is mildly elevated with an estimated pressure of 38.5 mmHg.  3. Left atrial size was mildly dilated.  4. The mitral valve is normal in structure.  5. The tricuspid valve is normal in structure. Tricuspid valve regurgitation is mild-moderate.  6. The aortic valve is tricuspid Mild thickening of the aortic valve.  7. The pulmonic valve was normal in structure.  8. Normal LV function; mild LVH; mild LAE; mild to moderate TR; mildly elevated pulmonary pressure.)      Anesthesia Quick Evaluation

## 2018-08-09 NOTE — Progress Notes (Signed)
PCP - Lucianne Lei Cardiologist - Dr. Quay Burow  Chest x-ray - 01-06-18 EKG - 07-11-18 Stress Test - 2016 ECHO - 03-25-18  Anesthesia review: yes, heart history, Hgb 9.6  Patient denies shortness of breath, fever, cough and chest pain at PAT appointment   Patient verbalized understanding of instructions that were given to them at the PAT appointment. Patient was also instructed that they will need to review over the PAT instructions again at home before surgery.

## 2018-08-11 ENCOUNTER — Other Ambulatory Visit: Payer: Self-pay | Admitting: Cardiovascular Disease

## 2018-08-11 NOTE — H&P (Signed)
Karen [name] is an 63 y.o. female.   Chief Complaint: port no longer needed HPI: patient has completed chemo for breast cancer and it is time to remove her port a cath. She has been doing well  Past Medical History:  Diagnosis Date  . Anemia   . Arthritis    knees  . Breast cancer ([place]) 2019   Right Breast Cancer  . Cancer ([place])   . Chest pain Jan 2016   low risk Myoview   . CHF (congestive heart failure) ([place])   . Complication of anesthesia 2005   difficulty remembering for a while and waking up  . Constipation   . Dysrhythmia    h/o A-Fib  . ESRD on dialysis [facility]) April 2016   MWF  . GERD (gastroesophageal reflux disease)   . Heart murmur Nov 2015   Aortic scleosis- no stenosis  . Hemodialysis patient ([place])   . Hypertension   . Shortness of breath dyspnea    with exertion    Past Surgical History:  Procedure Laterality Date  . ABDOMINAL HYSTERECTOMY  2005  . AV FISTULA PLACEMENT Left 12/09/2013   Procedure: INSERTION OF ARTERIOVENOUS (AV) GORE-TEX GRAFT ARM;  Surgeon: Elam Dutch, MD;  Location: [facility] OR;  Service: Vascular;  Laterality: Left;  . AV FISTULA PLACEMENT Right 01/01/2018   Procedure: INSERTION OF ARTERIOVENOUS (AV) GORE-TEX GRAFT ARM RIGHT ARM;  Surgeon: Elam Dutch, MD;  Location: [place];  Service: Vascular;  Laterality: Right;  . BREAST BIOPSY  1990's  . BUNIONECTOMY Bilateral   . CHOLECYSTECTOMY  2005  . COLONOSCOPY    . IR GENERIC HISTORICAL  01/11/2016   IR US GUIDE VASC ACCESS RIGHT 01/11/2016 Corrie Mckusick, DO [facility]-INTERV RAD  . IR GENERIC HISTORICAL  01/11/2016   IR RADIOLOGY PERIPHERAL GUIDED IV START 01/11/2016 Corrie Mckusick, DO [facility]-INTER[facility]  . IR IMAGING GUIDED PORT INSERTION  07/05/2017  . 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 KNEE ARTHROSCOPY Bilateral   . MASTECTOMY W/ SENTINEL NODE BIOPSY Right 11/20/2017  . MASTECTOMY W/ SENTINEL NODE BIOPSY Right 11/20/2017   Procedure: RIGHT MASTECTOMY WITH SENTINEL LYMPH NODE BIOPSY;   Surgeon: Coralie Keens, MD;  Location: [facility];  Service: General;  Laterality: Right;  . PERIPHERAL VASCULAR CATHETERIZATION N/A 08/31/2015   Procedure: A/V Shuntogram;  Surgeon: Serafina Mitchell, MD;  Location: [place] CV LAB;  Service: Cardiovascular;  Laterality: N/A;  . PERIPHERAL VASCULAR CATHETERIZATION Left 08/31/2015   Procedure: Peripheral Vascular Balloon Angioplasty;  Surgeon: Serafina Mitchell, MD;  Location: [place] CV LAB;  Service: Cardiovascular;  Laterality: Left;  arm fistula  . ROTATOR CUFF REPAIR Right 1997  . UPPER EXTREMITY VENOGRAPHY Right 12/24/2017   Procedure: UPPER EXTREMITY VENOGRAPHY CENTRAL VENOGRAM;  Surgeon: Waynetta Sandy, MD;  Location: [place] [facility];  Service: Cardiovascular;  Laterality: Right;    Family History  Problem Relation Age of Onset  . Kidney disease Mother   . Heart attack Father   . Kidney disease Father   . Diabetes Sister   . Hyperlipidemia Sister   . Hypertension Sister   . Kidney disease Sister        x2   Social History:  reports that she has never smoked. She has never used smokeless tobacco. She reports that she does not drink alcohol or use drugs.  Allergies: No Known Allergies  No medications prior to admission.    No results found for this or any previous visit (  from the past 48 hour(s)). No results found.  Review of Systems  All other systems reviewed and are negative.   There were no vitals taken for this visit. Physical Exam  Constitutional: She is oriented to person, place, and time. She appears well-developed and well-nourished. No distress.  Cardiovascular: Normal rate, regular rhythm and normal heart sounds.  Respiratory: Effort normal and breath sounds normal. No respiratory distress. She has no wheezes.  GI: Soft. There is no abdominal tenderness.  Musculoskeletal: Normal range of motion.        General: No deformity or edema.  Neurological: She is alert and oriented to person, place,  and time.  Skin: Skin is dry.  Psychiatric: Her behavior is normal. Judgment normal.     Assessment/Plan Port a cath no longer needed, hx of breast cancer  We will proceed with port removal.  I explained the risks and procedure in detail.  She agrees to proceed.  Coralie Keens, MD 08/11/2018, 11:08 AM

## 2018-08-12 ENCOUNTER — Ambulatory Visit (HOSPITAL_COMMUNITY): Payer: Medicare Other | Admitting: Anesthesiology

## 2018-08-12 ENCOUNTER — Encounter (HOSPITAL_COMMUNITY): Payer: Self-pay | Admitting: General Practice

## 2018-08-12 ENCOUNTER — Other Ambulatory Visit: Payer: Self-pay

## 2018-08-12 ENCOUNTER — Ambulatory Visit (HOSPITAL_COMMUNITY): Payer: Medicare Other | Admitting: Physician Assistant

## 2018-08-12 ENCOUNTER — Encounter (HOSPITAL_COMMUNITY)
Admission: RE | Disposition: A | Discharge status: Home / Self Care | Payer: Self-pay | Source: Home / Self Care | Attending: Surgery

## 2018-08-12 ENCOUNTER — Ambulatory Visit (HOSPITAL_COMMUNITY)
Admission: RE | Admit: 2018-08-12 | Discharge: 2018-08-12 | Disposition: A | Discharge status: Home / Self Care | Payer: Medicare Other | Attending: Surgery | Admitting: Surgery

## 2018-08-12 DIAGNOSIS — Z452 Encounter for adjustment and management of vascular access device: Secondary | ICD-10-CM | POA: Diagnosis not present

## 2018-08-12 DIAGNOSIS — Z79899 Other long term (current) drug therapy: Secondary | ICD-10-CM | POA: Insufficient documentation

## 2018-08-12 DIAGNOSIS — Z9221 Personal history of antineoplastic chemotherapy: Secondary | ICD-10-CM | POA: Insufficient documentation

## 2018-08-12 DIAGNOSIS — Z6836 Body mass index (BMI) 36.0-36.9, adult: Secondary | ICD-10-CM | POA: Insufficient documentation

## 2018-08-12 DIAGNOSIS — Z992 Dependence on renal dialysis: Secondary | ICD-10-CM | POA: Insufficient documentation

## 2018-08-12 DIAGNOSIS — D759 Disease of blood and blood-forming organs, unspecified: Secondary | ICD-10-CM | POA: Insufficient documentation

## 2018-08-12 DIAGNOSIS — R011 Cardiac murmur, unspecified: Secondary | ICD-10-CM | POA: Insufficient documentation

## 2018-08-12 DIAGNOSIS — Z20828 Contact with and (suspected) exposure to other viral communicable diseases: Secondary | ICD-10-CM | POA: Insufficient documentation

## 2018-08-12 DIAGNOSIS — I4891 Unspecified atrial fibrillation: Secondary | ICD-10-CM | POA: Insufficient documentation

## 2018-08-12 DIAGNOSIS — I132 Hypertensive heart and chronic kidney disease with heart failure and with stage 5 chronic kidney disease, or end stage renal disease: Secondary | ICD-10-CM | POA: Insufficient documentation

## 2018-08-12 DIAGNOSIS — K219 Gastro-esophageal reflux disease without esophagitis: Secondary | ICD-10-CM | POA: Insufficient documentation

## 2018-08-12 DIAGNOSIS — I509 Heart failure, unspecified: Secondary | ICD-10-CM | POA: Insufficient documentation

## 2018-08-12 DIAGNOSIS — N186 End stage renal disease: Secondary | ICD-10-CM | POA: Insufficient documentation

## 2018-08-12 DIAGNOSIS — Z853 Personal history of malignant neoplasm of breast: Secondary | ICD-10-CM | POA: Insufficient documentation

## 2018-08-12 DIAGNOSIS — D649 Anemia, unspecified: Secondary | ICD-10-CM | POA: Insufficient documentation

## 2018-08-12 DIAGNOSIS — M170 Bilateral primary osteoarthritis of knee: Secondary | ICD-10-CM | POA: Insufficient documentation

## 2018-08-12 DIAGNOSIS — I2510 Atherosclerotic heart disease of native coronary artery without angina pectoris: Secondary | ICD-10-CM | POA: Insufficient documentation

## 2018-08-12 HISTORY — PX: PORT-A-CATH REMOVAL: SHX5289

## 2018-08-12 LAB — POCT I-STAT 4, (NA,K, GLUC, HGB,HCT)
Glucose, Bld: 87 mg/dL (ref 70–99)
HCT: 31 % — ABNORMAL LOW (ref 36.0–46.0)
Hemoglobin: 10.5 g/dL — ABNORMAL LOW (ref 12.0–15.0)
Potassium: 4.3 mmol/L (ref 3.5–5.1)
Sodium: 138 mmol/L (ref 135–145)

## 2018-08-12 SURGERY — REMOVAL PORT-A-CATH
Anesthesia: Monitor Anesthesia Care

## 2018-08-12 MED ORDER — CEFAZOLIN SODIUM-DEXTROSE 2-4 GM/100ML-% IV SOLN
INTRAVENOUS | Status: AC
  Filled 2018-08-12: qty 100

## 2018-08-12 MED ORDER — PROPOFOL 10 MG/ML IV BOLUS
INTRAVENOUS | Status: DC | PRN
  Administered 2018-08-12: 10 mg via INTRAVENOUS
  Administered 2018-08-12: 15 mg via INTRAVENOUS

## 2018-08-12 MED ORDER — ONDANSETRON HCL 4 MG/2ML IJ SOLN
INTRAMUSCULAR | Status: DC | PRN
  Administered 2018-08-12: 4 mg via INTRAVENOUS

## 2018-08-12 MED ORDER — MIDAZOLAM HCL 2 MG/2ML IJ SOLN
INTRAMUSCULAR | Status: DC | PRN
  Administered 2018-08-12: 2 mg via INTRAVENOUS

## 2018-08-12 MED ORDER — FENTANYL CITRATE (PF) 250 MCG/5ML IJ SOLN
INTRAMUSCULAR | Status: AC
  Filled 2018-08-12: qty 5

## 2018-08-12 MED ORDER — FENTANYL CITRATE (PF) 100 MCG/2ML IJ SOLN
INTRAMUSCULAR | Status: DC | PRN
  Administered 2018-08-12: 50 ug via INTRAVENOUS

## 2018-08-12 MED ORDER — BUPIVACAINE HCL (PF) 0.25 % IJ SOLN
INTRAMUSCULAR | Status: AC
  Filled 2018-08-12: qty 30

## 2018-08-12 MED ORDER — TRAMADOL HCL 50 MG PO TABS: 50.0000 mg | ORAL_TABLET | Freq: Four times a day (QID) | ORAL | 0 refills | Status: DC | PRN

## 2018-08-12 MED ORDER — CEFAZOLIN SODIUM-DEXTROSE 2-4 GM/100ML-% IV SOLN
2.0000 g | INTRAVENOUS | Status: AC
  Administered 2018-08-12: 07:00:00 2 g via INTRAVENOUS

## 2018-08-12 MED ORDER — LIDOCAINE-EPINEPHRINE 1 %-1:100000 IJ SOLN
INTRAMUSCULAR | Status: AC
  Filled 2018-08-12: qty 1

## 2018-08-12 MED ORDER — CHLORHEXIDINE GLUCONATE CLOTH 2 % EX PADS: 6.0000 | MEDICATED_PAD | Freq: Once | CUTANEOUS | Status: DC

## 2018-08-12 MED ORDER — BUPIVACAINE-EPINEPHRINE (PF) 0.25% -1:200000 IJ SOLN
INTRAMUSCULAR | Status: AC
  Filled 2018-08-12: qty 30

## 2018-08-12 MED ORDER — GABAPENTIN 300 MG PO CAPS
ORAL_CAPSULE | ORAL | Status: AC
  Administered 2018-08-12: 300 mg via ORAL
  Filled 2018-08-12: qty 1

## 2018-08-12 MED ORDER — SODIUM CHLORIDE 0.9 % IV SOLN
INTRAVENOUS | Status: DC | PRN
  Administered 2018-08-12: 07:00:00 via INTRAVENOUS

## 2018-08-12 MED ORDER — MEPERIDINE HCL 25 MG/ML IJ SOLN: 6.2500 mg | INTRAMUSCULAR | Status: DC | PRN

## 2018-08-12 MED ORDER — ACETAMINOPHEN 500 MG PO TABS
ORAL_TABLET | ORAL | Status: AC
  Administered 2018-08-12: 07:00:00 1000 mg via ORAL
  Filled 2018-08-12: qty 2

## 2018-08-12 MED ORDER — ACETAMINOPHEN 500 MG PO TABS
1000.0000 mg | ORAL_TABLET | ORAL | Status: AC
  Administered 2018-08-12: 1000 mg via ORAL

## 2018-08-12 MED ORDER — PHENYLEPHRINE 40 MCG/ML (10ML) SYRINGE FOR IV PUSH (FOR BLOOD PRESSURE SUPPORT)
PREFILLED_SYRINGE | INTRAVENOUS | Status: DC | PRN
  Administered 2018-08-12 (×2): 80 ug via INTRAVENOUS

## 2018-08-12 MED ORDER — PROMETHAZINE HCL 25 MG/ML IJ SOLN: 6.2500 mg | INTRAMUSCULAR | Status: DC | PRN

## 2018-08-12 MED ORDER — PROPOFOL 500 MG/50ML IV EMUL
INTRAVENOUS | Status: DC | PRN
  Administered 2018-08-12: 30 ug/kg/min via INTRAVENOUS

## 2018-08-12 MED ORDER — FENTANYL CITRATE (PF) 100 MCG/2ML IJ SOLN: 25.0000 ug | INTRAMUSCULAR | Status: DC | PRN

## 2018-08-12 MED ORDER — GABAPENTIN 300 MG PO CAPS
300.0000 mg | ORAL_CAPSULE | ORAL | Status: AC
  Administered 2018-08-12: 300 mg via ORAL

## 2018-08-12 MED ORDER — BUPIVACAINE-EPINEPHRINE (PF) 0.25% -1:200000 IJ SOLN
INTRAMUSCULAR | Status: DC | PRN
  Administered 2018-08-12: 10 mL via PERINEURAL

## 2018-08-12 MED ORDER — MIDAZOLAM HCL 2 MG/2ML IJ SOLN: 0.5000 mg | Freq: Once | INTRAMUSCULAR | Status: DC | PRN

## 2018-08-12 MED ORDER — MIDAZOLAM HCL 2 MG/2ML IJ SOLN
INTRAMUSCULAR | Status: AC
  Filled 2018-08-12: qty 2

## 2018-08-12 MED ORDER — PROPOFOL 10 MG/ML IV BOLUS
INTRAVENOUS | Status: AC
  Filled 2018-08-12: qty 20

## 2018-08-12 SURGICAL SUPPLY — 32 items
CHLORAPREP W/TINT 10.5 ML (MISCELLANEOUS) ×3 IMPLANT
COVER SURGICAL LIGHT HANDLE (MISCELLANEOUS) ×3 IMPLANT
COVER WAND RF STERILE (DRAPES) ×3 IMPLANT
DECANTER SPIKE VIAL GLASS SM (MISCELLANEOUS) ×3 IMPLANT
DERMABOND ADVANCED (GAUZE/BANDAGES/DRESSINGS) ×2
DERMABOND ADVANCED .7 DNX12 (GAUZE/BANDAGES/DRESSINGS) ×1 IMPLANT
DRAPE LAPAROTOMY 100X72 PEDS (DRAPES) ×3 IMPLANT
DRAPE UTILITY XL STRL (DRAPES) ×6 IMPLANT
DRSG TEGADERM 4X4.75 (GAUZE/BANDAGES/DRESSINGS) ×3 IMPLANT
ELECT CAUTERY BLADE 6.4 (BLADE) ×3 IMPLANT
ELECT REM PT RETURN 9FT ADLT (ELECTROSURGICAL) ×3
ELECTRODE REM PT RTRN 9FT ADLT (ELECTROSURGICAL) ×1 IMPLANT
GAUZE 4X4 16PLY RFD (DISPOSABLE) ×3 IMPLANT
GAUZE SPONGE 2X2 8PLY STRL LF (GAUZE/BANDAGES/DRESSINGS) ×1 IMPLANT
GOWN STRL REUS W/ TWL LRG LVL3 (GOWN DISPOSABLE) ×1 IMPLANT
GOWN STRL REUS W/ TWL XL LVL3 (GOWN DISPOSABLE) ×1 IMPLANT
GOWN STRL REUS W/TWL LRG LVL3 (GOWN DISPOSABLE) ×2
GOWN STRL REUS W/TWL XL LVL3 (GOWN DISPOSABLE) ×2
KIT BASIN OR (CUSTOM PROCEDURE TRAY) ×3 IMPLANT
KIT TURNOVER KIT B (KITS) ×3 IMPLANT
NDL HYPO 25GX1X1/2 BEV (NEEDLE) ×1 IMPLANT
NEEDLE HYPO 25GX1X1/2 BEV (NEEDLE) ×3 IMPLANT
NS IRRIG 1000ML POUR BTL (IV SOLUTION) ×3 IMPLANT
PACK SURGICAL SETUP 50X90 (CUSTOM PROCEDURE TRAY) ×3 IMPLANT
PENCIL BUTTON HOLSTER BLD 10FT (ELECTRODE) ×3 IMPLANT
SPONGE GAUZE 2X2 STER 10/PKG (GAUZE/BANDAGES/DRESSINGS) ×2
SUT MNCRL AB 4-0 PS2 18 (SUTURE) ×3 IMPLANT
SUT VIC AB 3-0 SH 27 (SUTURE) ×2
SUT VIC AB 3-0 SH 27X BRD (SUTURE) ×1 IMPLANT
SYR CONTROL 10ML LL (SYRINGE) ×3 IMPLANT
TOWEL GREEN STERILE (TOWEL DISPOSABLE) ×3 IMPLANT
TOWEL GREEN STERILE FF (TOWEL DISPOSABLE) ×3 IMPLANT

## 2018-08-12 NOTE — Interval H&P Note (Signed)
History and Physical Interval Note: no change in H and P  08/12/2018 7:07 AM  Karen [name]  has presented today for surgery, with the diagnosis of PORT NO LONGER NEEDED, HX OF BREAST CANCER.  The various methods of treatment have been discussed with the patient and family. After consideration of risks, benefits and other options for treatment, the patient has consented to  Procedure(s): PORT-A-CATH REMOVAL (N/A) as a surgical intervention.  The patient's history has been reviewed, patient examined, no change in status, stable for surgery.  I have reviewed the patient's chart and labs.  Questions were answered to the patient's satisfaction.     Coralie Keens

## 2018-08-12 NOTE — Transfer of Care (Signed)
Immediate Anesthesia Transfer of Care Note  Patient: Karen [name]  Procedure(s) Performed: PORT-A-CATH REMOVAL (N/A )  Patient Location: PACU  Anesthesia Type:MAC  Level of Consciousness: drowsy and patient cooperative  Airway & Oxygen Therapy: Patient Spontanous Breathing  Post-op Assessment: Report given to RN and Post -op Vital signs reviewed and stable  Post vital signs: Reviewed and stable  Last Vitals:  Vitals Value Taken Time  BP    Temp    Pulse 78 08/12/18 0747  Resp 12 08/12/18 0747  SpO2 100 % 08/12/18 0747  Vitals shown include unvalidated device data.  Last Pain:  Vitals:   08/12/18 0639  TempSrc:   PainSc: 0-No pain      Patients Stated Pain Goal: 3 (71/25/27 1292)  Complications: No apparent anesthesia complications

## 2018-08-12 NOTE — Discharge Instructions (Signed)
Ok to shower starting tomorrow  Ice pack,tylenol, ibuprofen also for pain  No vigorous activity for one week

## 2018-08-12 NOTE — Anesthesia Procedure Notes (Signed)
Procedure Name: MAC Date/Time: 08/12/2018 7:22 AM Performed by: Elayne Snare, CRNA Pre-anesthesia Checklist: Patient identified, Emergency Drugs available, Suction available and Patient being monitored Patient Re-evaluated:Patient Re-evaluated prior to induction Oxygen Delivery Method: Simple face mask

## 2018-08-12 NOTE — Anesthesia Procedure Notes (Deleted)
Anesthesia Regional Block: Interscalene brachial plexus block   Pre-Anesthetic Checklist: ,, timeout performed, Correct Patient, Correct Site, Correct Laterality, Correct Procedure, Correct Position, site marked, Risks and benefits discussed,  Surgical consent,  Pre-op evaluation,  At surgeon's request and post-op pain management  Laterality: Left and Upper  Prep: chloraprep       Needles:   Needle Type: Echogenic Stimulator Needle     Needle Length: 9cm  Needle Gauge: 21     Additional Needles:   Procedures:, nerve stimulator,,, ultrasound used (permanent image in chart),,,,   Nerve Stimulator or Paresthesia:  Response: forearm twitch, 0.4 mA, 0.1 ms,   Additional Responses:   Narrative:  Start time: 08/12/2018 7:12 AM End time: 08/12/2018 7:19 AM Injection made incrementally with aspirations every 5 mL.  Performed by: Personally  Anesthesiologist: Annye Asa, MD  Additional Notes: Pt identified in Holding room.  Monitors applied. Working IV access confirmed. Sterile prep, drape L clavicle and neck.  #21ga ECHOgenic PNS to forearm twitch at 0.39mA threshold, with US guidance.  15cc 0.5% Bupivacaine with Eaparel injected incrementally after negative test dose.  Patient asymptomatic, VSS, no heme aspirated, tolerated well.  Jenita Seashore, MD

## 2018-08-12 NOTE — Anesthesia Postprocedure Evaluation (Signed)
Anesthesia Post Note  Patient: Karen [name]  Procedure(s) Performed: PORT-A-CATH REMOVAL (N/A )     Patient location during evaluation: PACU Anesthesia Type: MAC Level of consciousness: awake and alert, oriented and patient cooperative Pain management: pain level controlled Vital Signs Assessment: post-procedure vital signs reviewed and stable Respiratory status: spontaneous breathing, nonlabored ventilation and respiratory function stable Cardiovascular status: blood pressure returned to baseline and stable Postop Assessment: no apparent nausea or vomiting and adequate PO intake Anesthetic complications: no    Last Vitals:  Vitals:   08/12/18 0800 08/12/18 0807  BP: (!) 99/55 (!) 96/54  Pulse: 73 71  Resp: 10 12  Temp:    SpO2: 99% 99%    Last Pain:  Vitals:   08/12/18 0807  TempSrc:   PainSc: 0-No pain                 Vyom Brass,E. Lynnea Vandervoort

## 2018-08-12 NOTE — Op Note (Signed)
PORT-A-CATH REMOVAL  Procedure Note  Karen [name] 08/12/2018   Pre-op Diagnosis: PORT NO LONGER NEEDED, HX OF BREAST CANCER     Post-op Diagnosis: same  Procedure(s): PORT-A-CATH REMOVAL  Surgeon(s): Coralie Keens, MD  Anesthesia: Choice  Staff:  Circulator: Gloriann Loan, RN Scrub Person: Gwen Her, Benjaman Lobe, RN Circulator Assistant: Marliss Czar, RN  Estimated Blood Loss: Minimal               Procedure: The patient was brought to the operating room and identifies correct patient.  She was placed upon the operating table and anesthesia was induced.  Her right chest and neck were then prepped and draped in usual sterile fashion.  I anesthetized the skin at the previous insertion site and chest site from the Port-A-Cath with Marcaine.  I made incision with a scalpel through the previous scar over the Port-A-Cath.  I then tunneled down to the port with the cautery.  The port was easily identified and elevated out the incision.  I then easily remove the port and the entire length of catheter completely.  Pressure was held on the neck.  Hemostasis was achieved.  I closed the subcutaneous tissue with interrupted 3-0 Vicryl sutures and closed skin with a running 4-0 Monocryl.  Dermabond was then applied.  The patient tolerated the procedure well.  All the counts were correct at the end of the procedure.  The patient was then taken in a stable condition to the recovery room.          Coralie Keens   Date: 08/12/2018  Time: 7:43 AM

## 2018-08-13 ENCOUNTER — Encounter (HOSPITAL_COMMUNITY): Payer: Self-pay | Admitting: Surgery

## 2018-08-19 NOTE — Progress Notes (Signed)
Cardiology Office Note   Date:  08/20/2018   ID:  Karen [name], DOB [birth year], MRN [id number]  PCP:  Lucianne Lei, MD  Cardiologist: Dr.Berry  Follow-up evaluation for paroxysmal atrial fibrillation and history of chest pain.   History of Present Illness: Karen [name] is a 63 y.o. female who presents for ongoing assessment and management of hypertension, atrial flutter being followed by the atrial fib clinic, Roderic Palau, NP.  Patient also has a history of end-stage renal disease on dialysis, failed transplant in 2016.  She was noted to have arrhythmias only following dialysis.  The patient was given 30 mg of diltiazem as needed to use during dialysis but it did not seem to help.  She met with Dr. Rayann Heman in early 2020 and was offered ablation but the patient declined. She had Port-A-Cath removed on 08/12/2018 which was no longer being utilized for chemo in the setting of breast cancer.   She presents to the clinic today and states that she has been doing well.  She continues to feel fatigued after dialysis but is able to maintain her daily activities.  She states that she is fairly sedentary and does not do much physical activity.  She goes to dialysis on Monday Wednesday and Friday.  She states that her Port-A-Cath site feels like it is healing well.  She states that she notices a cough when she goes into atrial fibrillation and that she has maintained sinus rhythm for several months now.  She continues to use diltiazem as needed for her PAF.  She denies chest pain,increased shortness of breath, lower extremity edema,  palpitations, melena, hematuria, hemoptysis, diaphoresis, weakness, presyncope, syncope, orthopnea, and PND.   Past Medical History:  Diagnosis Date  . Anemia   . Arthritis    knees  . Breast cancer ([place]) 2019   Right Breast Cancer  . Cancer ([place])   . Chest pain Jan 2016   low risk Myoview   . CHF (congestive heart failure) ([place])   . Complication of anesthesia 2005   difficulty remembering for a while and waking up  . Constipation   . Dysrhythmia    h/o A-Fib  . ESRD on dialysis [facility]) April 2016   MWF  . GERD (gastroesophageal reflux disease)   . Heart murmur Nov 2015   Aortic scleosis- no stenosis  . Hemodialysis patient ([place])   . Hypertension   . Shortness of breath dyspnea    with exertion    Past Surgical History:  Procedure Laterality Date  . ABDOMINAL HYSTERECTOMY  2005  . AV FISTULA PLACEMENT Left 12/09/2013   Procedure: INSERTION OF ARTERIOVENOUS (AV) GORE-TEX GRAFT ARM;  Surgeon: Elam Dutch, MD;  Location: [facility] OR;  Service: Vascular;  Laterality: Left;  . AV FISTULA PLACEMENT Right 01/01/2018   Procedure: INSERTION OF ARTERIOVENOUS (AV) GORE-TEX GRAFT ARM RIGHT ARM;  Surgeon: Elam Dutch, MD;  Location: [place];  Service: Vascular;  Laterality: Right;  . BREAST BIOPSY  1990's  . BUNIONECTOMY Bilateral   . CHOLECYSTECTOMY  2005  . COLONOSCOPY    . IR GENERIC HISTORICAL  01/11/2016   IR US GUIDE VASC ACCESS RIGHT 01/11/2016 Corrie Mckusick, DO [facility]-INTER[facility]  . IR GENERIC HISTORICAL  01/11/2016   IR RADIOLOGY PERIPHERAL GUIDED IV START 01/11/2016 Corrie Mckusick, DO [facility]  . IR IMAGING GUIDED PORT INSERTION  07/05/2017  . 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 KNEE ARTHROSCOPY Bilateral   . MASTECTOMY W/ SENTINEL NODE BIOPSY  Right 11/20/2017  . MASTECTOMY W/ SENTINEL NODE BIOPSY Right 11/20/2017   Procedure: RIGHT MASTECTOMY WITH SENTINEL LYMPH NODE BIOPSY;  Surgeon: Coralie Keens, MD;  Location: [place];  Service: General;  Laterality: Right;  . PERIPHERAL VASCULAR CATHETERIZATION N/A 08/31/2015   Procedure: A/V Shuntogram;  Surgeon: Serafina Mitchell, MD;  Location: [place] CV LAB;  Service: Cardiovascular;  Laterality: N/A;  . PERIPHERAL VASCULAR CATHETERIZATION Left 08/31/2015   Procedure: Peripheral Vascular Balloon Angioplasty;  Surgeon: Serafina Mitchell, MD;  Location: [place] CV LAB;  Service: Cardiovascular;   Laterality: Left;  arm fistula  . PORT-A-CATH REMOVAL N/A 08/12/2018   Procedure: PORT-A-CATH REMOVAL;  Surgeon: Coralie Keens, MD;  Location: [place];  Service: General;  Laterality: N/A;  . ROTATOR CUFF REPAIR Right 1997  . UPPER EXTREMITY VENOGRAPHY Right 12/24/2017   Procedure: UPPER EXTREMITY VENOGRAPHY CENTRAL VENOGRAM;  Surgeon: Waynetta Sandy, MD;  Location: [place] CV LAB;  Service: Cardiovascular;  Laterality: Right;     Current Outpatient Medications  Medication Sig Dispense Refill  . acetaminophen (TYLENOL) 500 MG tablet Take 1,000 mg by mouth daily as needed for mild pain, moderate pain or headache.     Marland Kitchen amoxicillin (AMOXIL) 500 MG capsule Take 2,000 mg by mouth See admin instructions. Take 4 capsules (2000 mg) by mouth one hour prior to dental procedures    . clobetasol cream (TEMOVATE) 1.19 % Apply 1 application topically daily as needed (for rash).     Marland Kitchen diltiazem (CARDIZEM) 30 MG tablet TAKE 1/2 TABLET EVERY 4 HOURS AS NEEDED FOR AFIB HEART RATE OVER 100 (Patient taking differently: Take 30 mg by mouth every Monday, Wednesday, and Friday. ) 45 tablet 1  . lidocaine-prilocaine (EMLA) cream Apply 1 application topically See admin instructions. Apply topically Monday, Wednesday and Friday before dialysis  6  . multivitamin (RENA-VIT) TABS tablet Take 1 tablet by mouth daily.    . ondansetron (ZOFRAN) 8 MG tablet Take 8 mg by mouth every 8 (eight) hours as needed for nausea or vomiting.    . pantoprazole (PROTONIX) 40 MG tablet TAKE 1 TABLET (40 MG TOTAL) BY MOUTH DAILY. 90 tablet 0  . sevelamer carbonate (RENVELA) 800 MG tablet Take 800-2,400 mg by mouth See admin instructions. Take 2400 mg by mouth 3 times daily with each meal and take 800 mg by mouth with snacks    . simvastatin (ZOCOR) 20 MG tablet TAKE 1 TABLET BY MOUTH EVERYDAY AT BEDTIME (Patient taking differently: Take 20 mg by mouth at bedtime. ) 90 tablet 2   No current facility-administered medications  for this visit.     Allergies:   Patient has no known allergies.    Social History:  The patient  reports that she has never smoked. She has never used smokeless tobacco. She reports that she does not drink alcohol or use drugs.   Family History:  The patient's family history includes Diabetes in her sister; Heart attack in her father; Hyperlipidemia in her sister; Hypertension in her sister; Kidney disease in her father, mother, and sister.    ROS: All other systems are reviewed and negative. Unless otherwise mentioned in H&P    PHYSICAL EXAM: VS:  BP 93/60   Pulse 98   Ht _0  (1.549 m)   Wt 193 lb 9.6 oz (87.8 kg)   SpO2 100%   BMI 36.58 kg/m  , BMI Body mass index is 36.58 kg/m. GEN: Well nourished, well developed, in no acute distress HEENT: normal  Neck: no JVD, carotid bruits, or masses Cardiac: RRR; no murmurs, rubs, or gallops,no edema  Respiratory:  Clear to auscultation bilaterally, normal work of breathing GI: soft, nontender, nondistended, + BS MS: no deformity or atrophy Skin: warm and dry, no rash, right upper chest Port-A-Cath site clean, dry, intact, no erythema neuro:  Strength and sensation are intact Psych: euthymic mood, full affect   EKG:  EKG is not ordered today.    Recent Labs: 08/06/2018: ALT 13; BUN 31; Creatinine 7.25; Platelet Count 176 08/12/2018: Hemoglobin 10.5; Potassium 4.3; Sodium 138    Lipid Panel    Component Value Date/Time   CHOL 165 06/08/2015 0704   TRIG 44 06/08/2015 0704   HDL 59 06/08/2015 0704   CHOLHDL 2.8 06/08/2015 0704   VLDL 9 06/08/2015 0704   LDLCALC 97 06/08/2015 0704      Wt Readings from Last 3 Encounters:  08/20/18 193 lb 9.6 oz (87.8 kg)  08/12/18 191 lb 8 oz (86.9 kg)  08/09/18 191 lb 8 oz (86.9 kg)      Other studies Reviewed: Echo- 12/2017-Study Conclusions  - Left ventricle: The cavity size was normal. Systolic function was normal. The estimated ejection fraction was in the range of 55%  to 60%. Wall motion was normal; there were no regional wall motion abnormalities. Left ventricular diastolic function parameters were normal. - Right atrium: Mobile calcified structur in RA not well visualized may be calcified chiari network. - Atrial septum: No defect or patent foramen ovale was identified. - Pulmonary arteries: PA peak pressure: 33 mm Hg (S).   TTE 03/25/18:  1. The left ventricle has normal systolic function with an ejection fraction of 60-65%. The cavity size was normal. There is mildly increased left ventricular wall thickness. Left ventricular diastolic parameters were normal.  2. The right ventricle has normal systolic function. The cavity was normal. There is no increase in right ventricular wall thickness. Right ventricular systolic pressure is mildly elevated with an estimated pressure of 38.5 mmHg.  3. Left atrial size was mildly dilated.  4. The mitral valve is normal in structure.  5. The tricuspid valve is normal in structure. Tricuspid valve regurgitation is mild-moderate.  6. The aortic valve is tricuspid Mild thickening of the aortic valve.  7. The pulmonic valve was normal in structure.  8. Normal LV function; mild LVH; mild LAE; mild to moderate TR; mildly elevated pulmonary pressure.)   ASSESSMENT AND PLAN:  1.  Paroxysmal atrial fibrillation- EKG's 07/11/2018,02/07/2018 Normal sinus rhythm. Pt able to feel when she does not maintain NSR.  Continue diltiazem 15 mg every 4 hours as needed for A. fib heart rate over 100. Continue atrial fibrillation monitoring and management by A. fib clinic as needed  2.  History of chest pain-no chest pain today. Continue heart healthy diet Increase physical activity as tolerated Below the knee support stockings recommended  3.  Hyperlipidemia-LDL 06/08/2015 Continue simvastatin 20 mg tablet at bedtime  Monitored by PCP  Disposition: Follow-up with Dr. Gwenlyn Found in 6 months  labs/ tests ordered today  include:None today  Jossie Ng. Teondra Newburg NP-C    08/20/2018 10:09 AM    [address] Office [phone] Fax [phone]

## 2018-08-20 ENCOUNTER — Other Ambulatory Visit: Payer: Self-pay

## 2018-08-20 ENCOUNTER — Encounter: Payer: Self-pay | Admitting: Adult Health

## 2018-08-20 ENCOUNTER — Ambulatory Visit (INDEPENDENT_AMBULATORY_CARE_PROVIDER_SITE_OTHER): Payer: Medicare Other | Admitting: General Practice

## 2018-08-20 VITALS — BP 93/60 | HR 98 | Ht 61.0 in | Wt 193.6 lb

## 2018-08-20 DIAGNOSIS — E785 Hyperlipidemia, unspecified: Secondary | ICD-10-CM | POA: Diagnosis not present

## 2018-08-20 DIAGNOSIS — I480 Paroxysmal atrial fibrillation: Secondary | ICD-10-CM | POA: Diagnosis not present

## 2018-08-20 DIAGNOSIS — Z87898 Personal history of other specified conditions: Secondary | ICD-10-CM | POA: Diagnosis not present

## 2018-08-20 NOTE — Patient Instructions (Signed)
Medication Instructions:  Continue current medications  If you need a refill on your cardiac medications before your next appointment, please call your pharmacy.  Labwork: None Ordered   Testing/Procedures: None Ordered  Follow-Up: You will need a follow up appointment in 6 months.  Please call our office 2 months in advance to schedule this appointment.  You may see Jonathan Berry, MD or one of the following Advanced Practice Providers on your designated Care Team:   Luke Kilroy, PA-C Krista Kroeger, PA-C . Callie Goodrich, PA-C     At [facility], you and your health needs are our priority.  As part of our continuing mission to provide you with exceptional heart care, we have created designated Provider Care Teams.  These Care Teams include your primary Cardiologist (physician) and Advanced Practice Providers (APPs -  Physician Assistants and Nurse Practitioners) who all work together to provide you with the care you need, when you need it.  Thank you for choosing [facility] at [facility]!!     

## 2018-08-28 ENCOUNTER — Telehealth (HOSPITAL_COMMUNITY): Payer: Self-pay | Admitting: Rehabilitation

## 2018-08-28 NOTE — Telephone Encounter (Signed)

## 2018-08-29 ENCOUNTER — Other Ambulatory Visit: Payer: Self-pay

## 2018-08-29 ENCOUNTER — Encounter: Payer: Self-pay | Admitting: *Deleted

## 2018-08-29 ENCOUNTER — Ambulatory Visit (INDEPENDENT_AMBULATORY_CARE_PROVIDER_SITE_OTHER): Payer: Medicare Other | Admitting: Physician Assistant

## 2018-08-29 ENCOUNTER — Other Ambulatory Visit (HOSPITAL_COMMUNITY)
Admission: RE | Admit: 2018-08-29 | Discharge: 2018-08-29 | Disposition: A | Discharge status: Home / Self Care | Payer: Medicare Other | Source: Ambulatory Visit | Attending: Vascular Surgery | Admitting: Vascular Surgery

## 2018-08-29 ENCOUNTER — Telehealth: Payer: Self-pay | Admitting: *Deleted

## 2018-08-29 VITALS — BP 100/69 | HR 81 | Temp 97.8°F | Resp 14 | Ht 61.0 in | Wt 194.6 lb

## 2018-08-29 DIAGNOSIS — N186 End stage renal disease: Secondary | ICD-10-CM | POA: Insufficient documentation

## 2018-08-29 DIAGNOSIS — Z992 Dependence on renal dialysis: Secondary | ICD-10-CM

## 2018-08-29 DIAGNOSIS — Z20828 Contact with and (suspected) exposure to other viral communicable diseases: Secondary | ICD-10-CM | POA: Diagnosis not present

## 2018-08-29 DIAGNOSIS — Z01812 Encounter for preprocedural laboratory examination: Secondary | ICD-10-CM | POA: Diagnosis not present

## 2018-08-29 LAB — SARS CORONAVIRUS 2 (TAT 6-24 HRS): SARS Coronavirus 2: NEGATIVE

## 2018-08-29 NOTE — Telephone Encounter (Signed)
Agreeable to change time for 09/02/2018 case. To arrive at [facility] admitting at 5:30 am. NPO past MN night prior except for Diltiazem with sips of water. Must have a driver and caregiver for discharge to home.  Verbalized understanding.

## 2018-08-29 NOTE — Progress Notes (Signed)
Call to [facility] to expect patient today for pre-op nasal swab. Call to Shawn at [facility] to re schedule HD to accommodate procedure on Monday 09/02/2018. Patient verbalized understanding and to go to nasal swab from this office.

## 2018-08-29 NOTE — Progress Notes (Signed)
History of Present Illness:  Patient is a 63 y.o. year old female who presents for placement of a permanent hemodialysis access. She previously had a failed left upper arm AV graft.  This was placed in 2015.  She had a central venogram by Dr. Donzetta Matters 12/24/2017 Findings: There was difficulty getting IV access.  Basilic vein sheath was placed.  Basilic vein was into the deep system which was patent throughout its course.  There is a port as well as a tunnel catheter in place that are non-flow-limiting.    Patient is scheduled for right upper extremity AV graft.  Her last access 01/01/2018 by Dr. Oneida Alar was a  Left AV graft.  She under went Cancer treatments and the graft was occluded 2 times.  [facility] performed thrombectomy x 2 and By February they gave up on it and placed a TDC.  She is her today for options.    She denise pain, loss of sensation and loss of motor in B UE.     Past Medical History:  Diagnosis Date   Anemia    Arthritis    knees   Breast cancer ([place]) 2019   Right Breast Cancer   Cancer [facility])    Chest pain Jan 2016   low risk Myoview    CHF (congestive heart failure) ([place])    Complication of anesthesia 2005   difficulty remembering for a while and waking up   Constipation    Dysrhythmia    h/o A-Fib   ESRD on dialysis [facility]) April 2016   MWF   GERD (gastroesophageal reflux disease)    Heart murmur Nov 2015   Aortic scleosis- no stenosis   Hemodialysis patient ([place])    Hypertension    Shortness of breath dyspnea    with exertion    Past Surgical History:  Procedure Laterality Date   ABDOMINAL HYSTERECTOMY  2005   AV FISTULA PLACEMENT Left 12/09/2013   Procedure: INSERTION OF ARTERIOVENOUS (AV) GORE-TEX GRAFT ARM;  Surgeon: Elam Dutch, MD;  Location: [place];  Service: Vascular;  Laterality: Left;   AV FISTULA PLACEMENT Right 01/01/2018   Procedure: INSERTION OF ARTERIOVENOUS (AV) GORE-TEX GRAFT ARM RIGHT ARM;  Surgeon: Elam Dutch, MD;  Location: [place];  Service: Vascular;  Laterality: Right;   BREAST BIOPSY  1990's   BUNIONECTOMY Bilateral    CHOLECYSTECTOMY  2005   COLONOSCOPY     IR GENERIC HISTORICAL  01/11/2016   IR US GUIDE VASC ACCESS RIGHT 01/11/2016 Corrie Mckusick, DO [facility]-INTER[facility]   IR GENERIC HISTORICAL  01/11/2016   IR RADIOLOGY PERIPHERAL GUIDED IV START 01/11/2016 Corrie Mckusick, DO [facility]   IR IMAGING GUIDED PORT INSERTION  07/05/2017   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   KNEE ARTHROSCOPY Bilateral    MASTECTOMY W/ SENTINEL NODE BIOPSY Right 11/20/2017   MASTECTOMY W/ SENTINEL NODE BIOPSY Right 11/20/2017   Procedure: RIGHT MASTECTOMY WITH SENTINEL LYMPH NODE BIOPSY;  Surgeon: Coralie Keens, MD;  Location: [place];  Service: General;  Laterality: Right;   PERIPHERAL VASCULAR CATHETERIZATION N/A 08/31/2015   Procedure: A/V Shuntogram;  Surgeon: Serafina Mitchell, MD;  Location: [place] CV LAB;  Service: Cardiovascular;  Laterality: N/A;   PERIPHERAL VASCULAR CATHETERIZATION Left 08/31/2015   Procedure: Peripheral Vascular Balloon Angioplasty;  Surgeon: Serafina Mitchell, MD;  Location: [place] CV LAB;  Service: Cardiovascular;  Laterality: Left;  arm fistula   PORT-A-CATH REMOVAL N/A 08/12/2018  Procedure: PORT-A-CATH REMOVAL;  Surgeon: Coralie Keens, MD;  Location: [place];  Service: General;  Laterality: N/A;   [place] Right 12/24/2017   Procedure: UPPER EXTREMITY VENOGRAPHY CENTRAL VENOGRAM;  Surgeon: Waynetta Sandy, MD;  Location: [place] CV LAB;  Service: Cardiovascular;  Laterality: Right;     Social History Social History   Tobacco Use   Smoking status: Never Smoker   Smokeless tobacco: Never Used  Substance Use Topics   Alcohol use: No   Drug use: No    Family History Family History  Problem Relation Age of Onset   Kidney disease Mother    Heart attack Father    Kidney disease  Father    Diabetes Sister    Hyperlipidemia Sister    Hypertension Sister    Kidney disease Sister        x2    Allergies  No Known Allergies   Current Outpatient Medications  Medication Sig Dispense Refill   acetaminophen (TYLENOL) 500 MG tablet Take 1,000 mg by mouth daily as needed for mild pain, moderate pain or headache.      amoxicillin (AMOXIL) 500 MG capsule Take 2,000 mg by mouth See admin instructions. Take 4 capsules (2000 mg) by mouth one hour prior to dental procedures     clobetasol cream (TEMOVATE) 2.95 % Apply 1 application topically daily as needed (for rash).      diltiazem (CARDIZEM) 30 MG tablet TAKE 1/2 TABLET EVERY 4 HOURS AS NEEDED FOR AFIB HEART RATE OVER 100 (Patient taking differently: Take 30 mg by mouth every Monday, Wednesday, and Friday. ) 45 tablet 1   lidocaine-prilocaine (EMLA) cream Apply 1 application topically See admin instructions. Apply topically Monday, Wednesday and Friday before dialysis  6   multivitamin (RENA-VIT) TABS tablet Take 1 tablet by mouth daily.     ondansetron (ZOFRAN) 8 MG tablet Take 8 mg by mouth every 8 (eight) hours as needed for nausea or vomiting.     pantoprazole (PROTONIX) 40 MG tablet TAKE 1 TABLET (40 MG TOTAL) BY MOUTH DAILY. 90 tablet 0   sevelamer carbonate (RENVELA) 800 MG tablet Take 800-2,400 mg by mouth See admin instructions. Take 2400 mg by mouth 3 times daily with each meal and take 800 mg by mouth with snacks     simvastatin (ZOCOR) 20 MG tablet TAKE 1 TABLET BY MOUTH EVERYDAY AT BEDTIME (Patient taking differently: Take 20 mg by mouth at bedtime. ) 90 tablet 2   No current facility-administered medications for this visit.     ROS:   General:  No weight loss, Fever, chills  HEENT: No recent headaches, no nasal bleeding, no visual changes, no sore throat  Neurologic: No dizziness, blackouts, seizures. No recent symptoms of stroke or mini- stroke. No recent episodes of slurred speech, or  temporary blindness.  Cardiac: No recent episodes of chest pain/pressure, no shortness of breath at rest.  No shortness of breath with exertion.  Denies history of atrial fibrillation or irregular heartbeat  Vascular: No history of rest pain in feet.  No history of claudication.  No history of non-healing ulcer, No history of DVT   Pulmonary: No home oxygen, no productive cough, no hemoptysis,  No asthma or wheezing  Musculoskeletal:  [x ] Arthritis, [ ]  Low back pain,  [ ]  Joint pain  Hematologic:No history of hypercoagulable state.  No history of easy bleeding.  + history of anemia  Gastrointestinal: No hematochezia or  melena,  No gastroesophageal reflux, no trouble swallowing  Urinary: [ ]  chronic Kidney disease, [x ] on HD - [x ] MWF or [ ]  TTHS, [ ]  Burning with urination, [ ]  Frequent urination, [ ]  Difficulty urinating;   Skin: No rashes  Psychological: No history of anxiety,  No history of depression   Physical Examination  Vitals:   08/29/18 1353  BP: 100/69  Pulse: 81  Resp: 14  Temp: 97.8 F (36.6 C)  TempSrc: Temporal  SpO2: 98%  Weight: 194 lb 9.6 oz (88.3 kg)  Height: 5\' 1"  (1.549 m)    Body mass index is 36.77 kg/m.  General:  Alert and oriented, no acute distress HEENT: Normal Neck: No bruit or JVD Pulmonary: Clear to auscultation bilaterally Cardiac: Regular Rate and Rhythm without murmur Gastrointestinal: Soft, non-tender, non-distended, no mass, no scars Skin: No rash Extremity Pulses:  2+ radial, brachial pulses bilaterally, no thrill in right AV graft Musculoskeletal: No deformity or edema  Neurologic: Upper and lower extremity motor 5/5 and symmetric    ASSESSMENT:  ESRD on HD via left TDC  PLAN: We will schedule her for repeat venogram and plan new access based on the results and recommendations.  Roxy Horseman PA-C [facility] [place] Office: [phone]  MD in clinic [place]

## 2018-09-02 ENCOUNTER — Encounter: Payer: Self-pay | Admitting: *Deleted

## 2018-09-02 ENCOUNTER — Telehealth: Payer: Self-pay | Admitting: *Deleted

## 2018-09-02 ENCOUNTER — Other Ambulatory Visit: Payer: Self-pay

## 2018-09-02 ENCOUNTER — Other Ambulatory Visit: Payer: Self-pay | Admitting: *Deleted

## 2018-09-02 ENCOUNTER — Encounter (HOSPITAL_COMMUNITY): Payer: Self-pay | Admitting: Vascular Surgery

## 2018-09-02 ENCOUNTER — Ambulatory Visit (HOSPITAL_COMMUNITY)
Admission: RE | Admit: 2018-09-02 | Discharge: 2018-09-02 | Disposition: A | Discharge status: Home / Self Care | Payer: Medicare Other | Attending: Vascular Surgery | Admitting: Vascular Surgery

## 2018-09-02 ENCOUNTER — Encounter (HOSPITAL_COMMUNITY)
Admission: RE | Disposition: A | Discharge status: Home / Self Care | Payer: Self-pay | Source: Home / Self Care | Attending: Vascular Surgery

## 2018-09-02 DIAGNOSIS — Z940 Kidney transplant status: Secondary | ICD-10-CM | POA: Insufficient documentation

## 2018-09-02 DIAGNOSIS — Z90710 Acquired absence of both cervix and uterus: Secondary | ICD-10-CM | POA: Diagnosis not present

## 2018-09-02 DIAGNOSIS — T8611 Kidney transplant rejection: Secondary | ICD-10-CM | POA: Diagnosis not present

## 2018-09-02 DIAGNOSIS — Z841 Family history of disorders of kidney and ureter: Secondary | ICD-10-CM | POA: Diagnosis not present

## 2018-09-02 DIAGNOSIS — I509 Heart failure, unspecified: Secondary | ICD-10-CM | POA: Insufficient documentation

## 2018-09-02 DIAGNOSIS — K219 Gastro-esophageal reflux disease without esophagitis: Secondary | ICD-10-CM | POA: Insufficient documentation

## 2018-09-02 DIAGNOSIS — Z992 Dependence on renal dialysis: Secondary | ICD-10-CM | POA: Insufficient documentation

## 2018-09-02 DIAGNOSIS — N186 End stage renal disease: Secondary | ICD-10-CM | POA: Insufficient documentation

## 2018-09-02 DIAGNOSIS — Z833 Family history of diabetes mellitus: Secondary | ICD-10-CM | POA: Insufficient documentation

## 2018-09-02 DIAGNOSIS — I499 Cardiac arrhythmia, unspecified: Secondary | ICD-10-CM | POA: Diagnosis not present

## 2018-09-02 DIAGNOSIS — Z8249 Family history of ischemic heart disease and other diseases of the circulatory system: Secondary | ICD-10-CM | POA: Insufficient documentation

## 2018-09-02 DIAGNOSIS — Z79899 Other long term (current) drug therapy: Secondary | ICD-10-CM | POA: Insufficient documentation

## 2018-09-02 DIAGNOSIS — I132 Hypertensive heart and chronic kidney disease with heart failure and with stage 5 chronic kidney disease, or end stage renal disease: Secondary | ICD-10-CM | POA: Insufficient documentation

## 2018-09-02 DIAGNOSIS — Z95828 Presence of other vascular implants and grafts: Secondary | ICD-10-CM | POA: Insufficient documentation

## 2018-09-02 DIAGNOSIS — M170 Bilateral primary osteoarthritis of knee: Secondary | ICD-10-CM | POA: Diagnosis not present

## 2018-09-02 HISTORY — PX: UPPER EXTREMITY VENOGRAPHY: CATH118272

## 2018-09-02 LAB — POCT I-STAT, CHEM 8
BUN: 52 mg/dL — ABNORMAL HIGH (ref 8–23)
Calcium, Ion: 1.04 mmol/L — ABNORMAL LOW (ref 1.15–1.40)
Chloride: 99 mmol/L (ref 98–111)
Creatinine, Ser: 8.5 mg/dL — ABNORMAL HIGH (ref 0.44–1.00)
Glucose, Bld: 91 mg/dL (ref 70–99)
HCT: 30 % — ABNORMAL LOW (ref 36.0–46.0)
Hemoglobin: 10.2 g/dL — ABNORMAL LOW (ref 12.0–15.0)
Potassium: 4.6 mmol/L (ref 3.5–5.1)
Sodium: 139 mmol/L (ref 135–145)
TCO2: 31 mmol/L (ref 22–32)

## 2018-09-02 SURGERY — UPPER EXTREMITY VENOGRAPHY
Anesthesia: LOCAL | Laterality: Bilateral

## 2018-09-02 MED ORDER — IODIXANOL 320 MG/ML IV SOLN
INTRAVENOUS | Status: DC | PRN
  Administered 2018-09-02: 20 mL via INTRAVENOUS

## 2018-09-02 MED ORDER — SODIUM CHLORIDE 0.9% FLUSH: 3.0000 mL | INTRAVENOUS | Status: DC | PRN

## 2018-09-02 SURGICAL SUPPLY — 2 items
STOPCOCK MORSE 400PSI 3WAY (MISCELLANEOUS) ×1 IMPLANT
TUBING CIL FLEX 10 FLL-RA (TUBING) ×1 IMPLANT

## 2018-09-02 NOTE — H&P (Signed)
   History and Physical Update  The patient was interviewed and re-examined.  The patient's previous History and Physical has been reviewed and is unchanged from recent office visits. Plan for bilateral venography.   Keyonta Barradas C. Donzetta Matters, MD [facility] [place] Office: [phone] Pager: [phone]   09/02/2018, 7:24 AM

## 2018-09-02 NOTE — Progress Notes (Signed)
Discharge instructions reviewed with pt voices understanding.  

## 2018-09-02 NOTE — Progress Notes (Signed)
Surgery for A/V access scheduled for 09/17/2018 with Dr.Cain at patient's request. Pre-op instruction letter faxed to [facility] as well as reviewed with patient over the phone. Verbalized understanding.

## 2018-09-02 NOTE — Telephone Encounter (Signed)
Faxed received per ANN at [facility].

## 2018-09-02 NOTE — Op Note (Signed)
    Patient name: [name] MRN: [id number] DOB: 11-25-1955 Sex: female  09/02/2018 Pre-operative Diagnosis: End-stage renal disease Post-operative diagnosis:  Same Surgeon:  Erlene Quan C. Donzetta Matters, MD Procedure Performed:  Bilateral upper extremity venography  Indications: 63 year old female with end-stage renal disease.  She currently dialyzes via left IJ tunnel catheter.  Previously had a right IJ Mediport that has been removed.  She is now indicated for venography to plan further dialysis access.  Findings: Right central venous system appears patent and fills right atrium briskly.  Left side appears occluded at the site of her tunneled dialysis catheter.  We will plan for right arm AV grafting.   Procedure:  The patient was identified in the holding area and taken to room 8.  The patient was then placed supine on the table and prepped and draped in the usual sterile fashio n.  A time out was called.  She had previously placed bilateral upper extremity IVs.  These were injected first the right followed by the left with contrast with the above-noted findings.  She tolerated all procedures well.  I discussed with her the findings.  We will plan for right arm AV grafting.  Contrast:20cc   Alyene Predmore C. Donzetta Matters, MD [facility] [place] Office: [phone] Pager: [phone]

## 2018-09-02 NOTE — Discharge Instructions (Signed)
Venogram, Care After °This sheet gives you information about how to care for yourself after your procedure. Your health care provider may also give you more specific instructions. If you have problems or questions, contact your health care provider. °What can I expect after the procedure? °After the procedure, it is common to have: °· Bruising or mild discomfort in the area where the IV was inserted (insertion site). °Follow these instructions at home: °Eating and drinking ° °· Follow instructions from your health care provider about eating or drinking restrictions. °· Drink a lot of fluids for the first several days after the procedure, as directed by your health care provider. This helps to wash (flush) the contrast out of your body. Examples of healthy fluids include water or low-calorie drinks. °General instructions °· Check your IV insertion area every day for signs of infection. Check for: °? Redness, swelling, or pain. °? Fluid or blood. °? Warmth. °? Pus or a bad smell. °· Take over-the-counter and prescription medicines only as told by your health care provider. °· Rest and return to your normal activities as told by your health care provider. Ask your health care provider what activities are safe for you. °· Do not drive for 24 hours if you were given a medicine to help you relax (sedative), or until your health care provider approves. °· Keep all follow-up visits as told by your health care provider. This is important. °Contact a health care provider if: °· Your skin becomes itchy or you develop a rash or hives. °· You have a fever that does not get better with medicine. °· You feel nauseous. °· You vomit. °· You have redness, swelling, or pain around the insertion site. °· You have fluid or blood coming from the insertion site. °· Your insertion area feels warm to the touch. °· You have pus or a bad smell coming from the insertion site. °Get help right away if: °· You have difficulty breathing or  shortness of breath. °· You develop chest pain. °· You faint. °· You feel very dizzy. °These symptoms may represent a serious problem that is an emergency. Do not wait to see if the symptoms will go away. Get medical help right away. Call your local emergency services (911 in the [place] Do not drive yourself to the hospital. °Summary °· After your procedure, it is common to have bruising or mild discomfort in the area where the IV was inserted. °· You should check your IV insertion area every day for signs of infection. °· Take over-the-counter and prescription medicines only as told by your health care provider. °· You should drink a lot of fluids for the first several days after the procedure to help flush the contrast from your body. °This information is not intended to replace advice given to you by your health care provider. Make sure you discuss any questions you have with your health care provider. °Document Released: 10/23/2012 Document Revised: 12/15/2016 Document Reviewed: 11/27/2015 °Elsevier Patient Education © 2020 [employer] ° °

## 2018-09-13 ENCOUNTER — Other Ambulatory Visit (HOSPITAL_COMMUNITY)
Admission: RE | Admit: 2018-09-13 | Discharge: 2018-09-13 | Disposition: A | Discharge status: Home / Self Care | Payer: Medicare Other | Source: Ambulatory Visit | Attending: Vascular Surgery | Admitting: Vascular Surgery

## 2018-09-13 DIAGNOSIS — Z01812 Encounter for preprocedural laboratory examination: Secondary | ICD-10-CM | POA: Insufficient documentation

## 2018-09-13 DIAGNOSIS — Z20828 Contact with and (suspected) exposure to other viral communicable diseases: Secondary | ICD-10-CM | POA: Insufficient documentation

## 2018-09-13 LAB — SARS CORONAVIRUS 2 (TAT 6-24 HRS): SARS Coronavirus 2: NEGATIVE

## 2018-09-14 ENCOUNTER — Other Ambulatory Visit: Payer: Self-pay

## 2018-09-14 ENCOUNTER — Encounter (HOSPITAL_COMMUNITY): Payer: Self-pay | Admitting: *Deleted

## 2018-09-14 NOTE — Progress Notes (Signed)
Denies chest pain or shob. Reports going to A- Fib clinic for h/o A- Fib. Patient reports compliance with quarantine and educated on the visitation policy. Verbalized understanding.

## 2018-09-16 NOTE — Anesthesia Preprocedure Evaluation (Addendum)
Anesthesia Evaluation  Patient identified by MRN, date of birth, ID band Patient awake    Reviewed: Allergy & Precautions, NPO status , Patient's Chart, lab work & pertinent test results  History of Anesthesia Complications (+) DIFFICULT IV STICK / SPECIAL LINE and history of anesthetic complications  Airway Mallampati: II  TM Distance: >3 FB Neck ROM: Full    Dental  (+) Poor Dentition, Missing,    Pulmonary neg pulmonary ROS,    Pulmonary exam normal        Cardiovascular hypertension, Pt. on medications +CHF  Normal cardiovascular exam+ dysrhythmias Atrial Fibrillation   TTE 03/25/18: EF 60-65%, RVSP mildly elevated at 38.63mHg, mild LAE, mild-moderate TVR   Neuro/Psych negative neurological ROS     GI/Hepatic Neg liver ROS, GERD  Medicated and Controlled,  Endo/Other  negative endocrine ROS  Renal/GU ESRF and DialysisRenal disease     Musculoskeletal negative musculoskeletal ROS (+)   Abdominal   Peds  Hematology  (+) anemia ,   Anesthesia Other Findings Day of surgery medications reviewed with the patient.  Reproductive/Obstetrics                            Anesthesia Physical Anesthesia Plan  ASA: III  Anesthesia Plan: MAC   Post-op Pain Management:    Induction:   PONV Risk Score and Plan: 2 and Treatment may vary due to age or medical condition, Propofol infusion and Ondansetron  Airway Management Planned: Natural Airway and Simple Face Mask  Additional Equipment:   Intra-op Plan:   Post-operative Plan:   Informed Consent: I have reviewed the patients History and Physical, chart, labs and discussed the procedure including the risks, benefits and alternatives for the proposed anesthesia with the patient or authorized representative who has indicated his/her understanding and acceptance.     Dental advisory given  Plan Discussed with: CRNA  Anesthesia Plan  Comments:        Anesthesia Quick Evaluation

## 2018-09-17 ENCOUNTER — Encounter (HOSPITAL_COMMUNITY): Payer: Self-pay | Admitting: *Deleted

## 2018-09-17 ENCOUNTER — Other Ambulatory Visit: Payer: Self-pay

## 2018-09-17 ENCOUNTER — Ambulatory Visit (HOSPITAL_COMMUNITY): Payer: Medicare Other | Admitting: Anesthesiology

## 2018-09-17 ENCOUNTER — Ambulatory Visit (HOSPITAL_COMMUNITY)
Admission: RE | Admit: 2018-09-17 | Discharge: 2018-09-17 | Disposition: A | Discharge status: Home / Self Care | Payer: Medicare Other | Attending: Vascular Surgery | Admitting: Vascular Surgery

## 2018-09-17 ENCOUNTER — Telehealth: Payer: Self-pay | Admitting: Vascular Surgery

## 2018-09-17 ENCOUNTER — Encounter (HOSPITAL_COMMUNITY)
Admission: RE | Disposition: A | Discharge status: Home / Self Care | Payer: Self-pay | Source: Home / Self Care | Attending: Vascular Surgery

## 2018-09-17 DIAGNOSIS — N186 End stage renal disease: Secondary | ICD-10-CM | POA: Diagnosis not present

## 2018-09-17 DIAGNOSIS — I509 Heart failure, unspecified: Secondary | ICD-10-CM | POA: Insufficient documentation

## 2018-09-17 DIAGNOSIS — Z853 Personal history of malignant neoplasm of breast: Secondary | ICD-10-CM | POA: Insufficient documentation

## 2018-09-17 DIAGNOSIS — N185 Chronic kidney disease, stage 5: Secondary | ICD-10-CM

## 2018-09-17 DIAGNOSIS — I1311 Hypertensive heart and chronic kidney disease without heart failure, with stage 5 chronic kidney disease, or end stage renal disease: Secondary | ICD-10-CM | POA: Diagnosis not present

## 2018-09-17 DIAGNOSIS — K219 Gastro-esophageal reflux disease without esophagitis: Secondary | ICD-10-CM | POA: Insufficient documentation

## 2018-09-17 DIAGNOSIS — Z992 Dependence on renal dialysis: Secondary | ICD-10-CM | POA: Insufficient documentation

## 2018-09-17 DIAGNOSIS — M170 Bilateral primary osteoarthritis of knee: Secondary | ICD-10-CM | POA: Insufficient documentation

## 2018-09-17 DIAGNOSIS — Z79899 Other long term (current) drug therapy: Secondary | ICD-10-CM | POA: Insufficient documentation

## 2018-09-17 DIAGNOSIS — I4891 Unspecified atrial fibrillation: Secondary | ICD-10-CM | POA: Insufficient documentation

## 2018-09-17 HISTORY — PX: AV FISTULA PLACEMENT: SHX1204

## 2018-09-17 LAB — POCT I-STAT 4, (NA,K, GLUC, HGB,HCT)
Glucose, Bld: 92 mg/dL (ref 70–99)
HCT: 35 % — ABNORMAL LOW (ref 36.0–46.0)
Hemoglobin: 11.9 g/dL — ABNORMAL LOW (ref 12.0–15.0)
Potassium: 3.8 mmol/L (ref 3.5–5.1)
Sodium: 138 mmol/L (ref 135–145)

## 2018-09-17 SURGERY — ARTERIOVENOUS (AV) FISTULA CREATION
Anesthesia: Monitor Anesthesia Care | Site: Arm Upper | Laterality: Right

## 2018-09-17 MED ORDER — 0.9 % SODIUM CHLORIDE (POUR BTL) OPTIME
TOPICAL | Status: DC | PRN
  Administered 2018-09-17: 08:00:00 1000 mL

## 2018-09-17 MED ORDER — CHLORHEXIDINE GLUCONATE 4 % EX LIQD: 60.0000 mL | Freq: Once | CUTANEOUS | Status: DC

## 2018-09-17 MED ORDER — ACETAMINOPHEN 10 MG/ML IV SOLN: 1000.0000 mg | Freq: Once | INTRAVENOUS | Status: DC | PRN

## 2018-09-17 MED ORDER — PROMETHAZINE HCL 25 MG/ML IJ SOLN: 6.2500 mg | INTRAMUSCULAR | Status: DC | PRN

## 2018-09-17 MED ORDER — MIDAZOLAM HCL 5 MG/5ML IJ SOLN
INTRAMUSCULAR | Status: DC | PRN
  Administered 2018-09-17 (×2): 1 mg via INTRAVENOUS

## 2018-09-17 MED ORDER — FENTANYL CITRATE (PF) 100 MCG/2ML IJ SOLN: 25.0000 ug | INTRAMUSCULAR | Status: DC | PRN

## 2018-09-17 MED ORDER — DEXMEDETOMIDINE HCL 200 MCG/2ML IV SOLN
INTRAVENOUS | Status: DC | PRN
  Administered 2018-09-17 (×2): 8 ug via INTRAVENOUS

## 2018-09-17 MED ORDER — SODIUM CHLORIDE 0.9 % IV SOLN
INTRAVENOUS | Status: DC
  Administered 2018-09-17: 07:00:00 via INTRAVENOUS

## 2018-09-17 MED ORDER — LIDOCAINE 2% (20 MG/ML) 5 ML SYRINGE
INTRAMUSCULAR | Status: DC | PRN
  Administered 2018-09-17: 30 mg via INTRAVENOUS

## 2018-09-17 MED ORDER — SODIUM CHLORIDE 0.9 % IV SOLN
INTRAVENOUS | Status: DC | PRN
  Administered 2018-09-17: 25 ug/min via INTRAVENOUS

## 2018-09-17 MED ORDER — PROPOFOL 10 MG/ML IV BOLUS
INTRAVENOUS | Status: AC
  Filled 2018-09-17: qty 20

## 2018-09-17 MED ORDER — LIDOCAINE-EPINEPHRINE (PF) 1 %-1:200000 IJ SOLN
INTRAMUSCULAR | Status: DC | PRN
  Administered 2018-09-17: 30 mL

## 2018-09-17 MED ORDER — MIDAZOLAM HCL 2 MG/2ML IJ SOLN
INTRAMUSCULAR | Status: AC
  Filled 2018-09-17: qty 2

## 2018-09-17 MED ORDER — EPHEDRINE SULFATE 50 MG/ML IJ SOLN
INTRAMUSCULAR | Status: DC | PRN
  Administered 2018-09-17: 5 mg via INTRAVENOUS

## 2018-09-17 MED ORDER — OXYCODONE HCL 5 MG PO TABS: 5.0000 mg | ORAL_TABLET | Freq: Four times a day (QID) | ORAL | 0 refills | Status: DC | PRN

## 2018-09-17 MED ORDER — OXYCODONE HCL 5 MG/5ML PO SOLN: 5.0000 mg | Freq: Once | ORAL | Status: DC | PRN

## 2018-09-17 MED ORDER — FENTANYL CITRATE (PF) 250 MCG/5ML IJ SOLN
INTRAMUSCULAR | Status: AC
  Filled 2018-09-17: qty 5

## 2018-09-17 MED ORDER — SODIUM CHLORIDE 0.9 % IV SOLN
INTRAVENOUS | Status: DC | PRN
  Administered 2018-09-17: 500 mL

## 2018-09-17 MED ORDER — PROPOFOL 500 MG/50ML IV EMUL
INTRAVENOUS | Status: DC | PRN
  Administered 2018-09-17: 25 ug/kg/min via INTRAVENOUS

## 2018-09-17 MED ORDER — CEFAZOLIN SODIUM-DEXTROSE 2-4 GM/100ML-% IV SOLN
2.0000 g | INTRAVENOUS | Status: AC
  Administered 2018-09-17: 08:00:00 2 g via INTRAVENOUS
  Filled 2018-09-17: qty 100

## 2018-09-17 MED ORDER — SODIUM CHLORIDE 0.9 % IV SOLN
INTRAVENOUS | Status: AC
  Filled 2018-09-17: qty 1.2

## 2018-09-17 MED ORDER — FENTANYL CITRATE (PF) 250 MCG/5ML IJ SOLN
INTRAMUSCULAR | Status: DC | PRN
  Administered 2018-09-17 (×2): 50 ug via INTRAVENOUS

## 2018-09-17 MED ORDER — OXYCODONE HCL 5 MG PO TABS: 5.0000 mg | ORAL_TABLET | Freq: Once | ORAL | Status: DC | PRN

## 2018-09-17 MED ORDER — HEPARIN SODIUM (PORCINE) 1000 UNIT/ML IJ SOLN
INTRAMUSCULAR | Status: AC
  Filled 2018-09-17: qty 1

## 2018-09-17 MED ORDER — LIDOCAINE-EPINEPHRINE (PF) 1 %-1:200000 IJ SOLN
INTRAMUSCULAR | Status: AC
  Filled 2018-09-17: qty 30

## 2018-09-17 MED ORDER — PHENYLEPHRINE 40 MCG/ML (10ML) SYRINGE FOR IV PUSH (FOR BLOOD PRESSURE SUPPORT)
PREFILLED_SYRINGE | INTRAVENOUS | Status: DC | PRN
  Administered 2018-09-17: 80 ug via INTRAVENOUS

## 2018-09-17 SURGICAL SUPPLY — 31 items
ARMBAND PINK RESTRICT EXTREMIT (MISCELLANEOUS) ×4 IMPLANT
CANISTER SUCT 3000ML PPV (MISCELLANEOUS) ×4 IMPLANT
CLIP VESOCCLUDE MED 6/CT (CLIP) ×4 IMPLANT
CLIP VESOCCLUDE SM WIDE 6/CT (CLIP) ×4 IMPLANT
COVER WAND RF STERILE (DRAPES) ×4 IMPLANT
DECANTER SPIKE VIAL GLASS SM (MISCELLANEOUS) ×4 IMPLANT
DERMABOND ADVANCED (GAUZE/BANDAGES/DRESSINGS) ×2
DERMABOND ADVANCED .7 DNX12 (GAUZE/BANDAGES/DRESSINGS) ×2 IMPLANT
ELECT REM PT RETURN 9FT ADLT (ELECTROSURGICAL) ×4
ELECTRODE REM PT RTRN 9FT ADLT (ELECTROSURGICAL) ×2 IMPLANT
GLOVE BIO SURGEON STRL SZ7.5 (GLOVE) ×4 IMPLANT
GOWN STRL REUS W/ TWL LRG LVL3 (GOWN DISPOSABLE) ×6 IMPLANT
GOWN STRL REUS W/ TWL XL LVL3 (GOWN DISPOSABLE) ×2 IMPLANT
GOWN STRL REUS W/TWL LRG LVL3 (GOWN DISPOSABLE) ×6
GOWN STRL REUS W/TWL XL LVL3 (GOWN DISPOSABLE) ×2
HEMOSTAT SNOW SURGICEL 2X4 (HEMOSTASIS) IMPLANT
INSERT FOGARTY SM (MISCELLANEOUS) ×4 IMPLANT
KIT BASIN OR (CUSTOM PROCEDURE TRAY) ×4 IMPLANT
KIT TURNOVER KIT B (KITS) ×4 IMPLANT
NS IRRIG 1000ML POUR BTL (IV SOLUTION) ×4 IMPLANT
PACK CV ACCESS (CUSTOM PROCEDURE TRAY) ×4 IMPLANT
PAD ARMBOARD 7.5X6 YLW CONV (MISCELLANEOUS) ×8 IMPLANT
SUT MNCRL AB 4-0 PS2 18 (SUTURE) ×4 IMPLANT
SUT PROLENE 6 0 BV (SUTURE) ×8 IMPLANT
SUT SILK 2 0 SH (SUTURE) ×4 IMPLANT
SUT VIC AB 3-0 SH 27 (SUTURE) ×4
SUT VIC AB 3-0 SH 27X BRD (SUTURE) ×4 IMPLANT
SYR TOOMEY 50ML (SYRINGE) IMPLANT
TOWEL GREEN STERILE (TOWEL DISPOSABLE) ×4 IMPLANT
UNDERPAD 30X30 (UNDERPADS AND DIAPERS) ×4 IMPLANT
WATER STERILE IRR 1000ML POUR (IV SOLUTION) ×4 IMPLANT

## 2018-09-17 NOTE — H&P (Signed)
History of Present Illness:  Patient is a 63 y.o. year old female who presents for placement of a permanent hemodialysis access. She previously had a failed left upper arm AV graft. This was placed in 2015.  She has undergone venography.        Past Medical History:  Diagnosis Date  . Anemia   . Arthritis    knees  . Breast cancer ([place]) 2019   Right Breast Cancer  . Cancer ([place])   . Chest pain Jan 2016   low risk Myoview   . CHF (congestive heart failure) ([place])   . Complication of anesthesia 2005   difficulty remembering for a while and waking up  . Constipation   . Dysrhythmia    h/o A-Fib  . ESRD on dialysis [facility]) April 2016   MWF  . GERD (gastroesophageal reflux disease)   . Heart murmur Nov 2015   Aortic scleosis- no stenosis  . Hemodialysis patient ([place])   . Hypertension   . Shortness of breath dyspnea    with exertion         Past Surgical History:  Procedure Laterality Date  . ABDOMINAL HYSTERECTOMY  2005  . AV FISTULA PLACEMENT Left 12/09/2013   Procedure: INSERTION OF ARTERIOVENOUS (AV) GORE-TEX GRAFT ARM;  Surgeon: Elam Dutch, MD;  Location: [facility] OR;  Service: Vascular;  Laterality: Left;  . AV FISTULA PLACEMENT Right 01/01/2018   Procedure: INSERTION OF ARTERIOVENOUS (AV) GORE-TEX GRAFT ARM RIGHT ARM;  Surgeon: Elam Dutch, MD;  Location: [facility];  Service: Vascular;  Laterality: Right;  . BREAST BIOPSY  1990's  . BUNIONECTOMY Bilateral   . CHOLECYSTECTOMY  2005  . COLONOSCOPY    . IR GENERIC HISTORICAL  01/11/2016   IR US GUIDE VASC ACCESS RIGHT 01/11/2016 Corrie Mckusick, DO [facility]-INTERV RAD  . IR GENERIC HISTORICAL  01/11/2016   IR RADIOLOGY PERIPHERAL GUIDED IV START 01/11/2016 Corrie Mckusick, DO [facility]INTER[facility]  . IR IMAGING GUIDED PORT INSERTION  07/05/2017  . 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 KNEE ARTHROSCOPY Bilateral   . MASTECTOMY W/ SENTINEL NODE BIOPSY Right 11/20/2017  . MASTECTOMY W/  SENTINEL NODE BIOPSY Right 11/20/2017   Procedure: RIGHT MASTECTOMY WITH SENTINEL LYMPH NODE BIOPSY;  Surgeon: Coralie Keens, MD;  Location: [place];  Service: General;  Laterality: Right;  . PERIPHERAL VASCULAR CATHETERIZATION N/A 08/31/2015   Procedure: A/V Shuntogram;  Surgeon: Serafina Mitchell, MD;  Location: [place] CV LAB;  Service: Cardiovascular;  Laterality: N/A;  . PERIPHERAL VASCULAR CATHETERIZATION Left 08/31/2015   Procedure: Peripheral Vascular Balloon Angioplasty;  Surgeon: Serafina Mitchell, MD;  Location: [place] [facility] LAB;  Service: Cardiovascular;  Laterality: Left;  arm fistula  . PORT-A-CATH REMOVAL N/A 08/12/2018   Procedure: PORT-A-CATH REMOVAL;  Surgeon: Coralie Keens, MD;  Location: [place];  Service: General;  Laterality: N/A;  . ROTATOR CUFF REPAIR Right 1997  . UPPER EXTREMITY VENOGRAPHY Right 12/24/2017   Procedure: UPPER EXTREMITY VENOGRAPHY CENTRAL VENOGRAM;  Surgeon: Waynetta Sandy, MD;  Location: [place] CV LAB;  Service: Cardiovascular;  Laterality: Right;     Social History Social History       Tobacco Use  . Smoking status: Never Smoker  . Smokeless tobacco: Never Used  Substance Use Topics  . Alcohol use: No  . Drug use: No    Family History      Family History  Problem Relation Age of Onset  . Kidney disease  Mother   . Heart attack Father   . Kidney disease Father   . Diabetes Sister   . Hyperlipidemia Sister   . Hypertension Sister   . Kidney disease Sister        x2    Allergies  No Known Allergies         Current Outpatient Medications  Medication Sig Dispense Refill  . acetaminophen (TYLENOL) 500 MG tablet Take 1,000 mg by mouth daily as needed for mild pain, moderate pain or headache.     Marland Kitchen amoxicillin (AMOXIL) 500 MG capsule Take 2,000 mg by mouth See admin instructions. Take 4 capsules (2000 mg) by mouth one hour prior to dental procedures    . clobetasol cream (TEMOVATE) 8.65 %  Apply 1 application topically daily as needed (for rash).     Marland Kitchen diltiazem (CARDIZEM) 30 MG tablet TAKE 1/2 TABLET EVERY 4 HOURS AS NEEDED FOR AFIB HEART RATE OVER 100 (Patient taking differently: Take 30 mg by mouth every Monday, Wednesday, and Friday. ) 45 tablet 1  . lidocaine-prilocaine (EMLA) cream Apply 1 application topically See admin instructions. Apply topically Monday, Wednesday and Friday before dialysis  6  . multivitamin (RENA-VIT) TABS tablet Take 1 tablet by mouth daily.    . ondansetron (ZOFRAN) 8 MG tablet Take 8 mg by mouth every 8 (eight) hours as needed for nausea or vomiting.    . pantoprazole (PROTONIX) 40 MG tablet TAKE 1 TABLET (40 MG TOTAL) BY MOUTH DAILY. 90 tablet 0  . sevelamer carbonate (RENVELA) 800 MG tablet Take 800-2,400 mg by mouth See admin instructions. Take 2400 mg by mouth 3 times daily with each meal and take 800 mg by mouth with snacks    . simvastatin (ZOCOR) 20 MG tablet TAKE 1 TABLET BY MOUTH EVERYDAY AT BEDTIME (Patient taking differently: Take 20 mg by mouth at bedtime. ) 90 tablet 2   No current facility-administered medications for this visit.     ROS:   General:  No weight loss, Fever, chills  HEENT: No recent headaches, no nasal bleeding, no visual changes, no sore throat  Neurologic: No dizziness, blackouts, seizures. No recent symptoms of stroke or mini- stroke. No recent episodes of slurred speech, or temporary blindness.  Cardiac: No recent episodes of chest pain/pressure, no shortness of breath at rest.  No shortness of breath with exertion.  Denies history of atrial fibrillation or irregular heartbeat  Vascular: No history of rest pain in feet.  No history of claudication.  No history of non-healing ulcer, No history of DVT   Pulmonary: No home oxygen, no productive cough, no hemoptysis,  No asthma or wheezing  Musculoskeletal:  [x ] Arthritis, [ ]  Low back pain,  [ ]  Joint pain  Hematologic:No history of  hypercoagulable state.  No history of easy bleeding.  + history of anemia  Gastrointestinal: No hematochezia or melena,  No gastroesophageal reflux, no trouble swallowing  Urinary: [ ]  chronic Kidney disease, [x ] on HD - [x ] MWF or [ ]  TTHS, [ ]  Burning with urination, [ ]  Frequent urination, [ ]  Difficulty urinating;   Skin: No rashes  Psychological: No history of anxiety,  No history of depression   Physical Examination     Vitals:   08/29/18 1353  BP: 100/69  Pulse: 81  Resp: 14  Temp: 97.8 F (36.6 C)  TempSrc: Temporal  SpO2: 98%  Weight: 194 lb 9.6 oz (88.3 kg)  Height: 5\' 1"  (1.549 m)  Body mass index is 36.77 kg/m.  General:  Alert and oriented, no acute distress HEENT: Normal Neck: No bruit or JVD Pulmonary: Clear to auscultation bilaterally Cardiac: Regular Rate and Rhythm without murmur Gastrointestinal: Soft, non-tender, non-distended, no mass, no scars Skin: No rash Extremity Pulses:  2+ radial, brachial pulses bilaterally, no thrill in right AV graft Musculoskeletal: No deformity or edema      Neurologic: Upper and lower extremity motor 5/5 and symmetric    ASSESSMENT:  ESRD on HD via left TDC  PLAN: right arm av graft today in OR.  Ladrea Holladay C. Donzetta Matters, MD [facility] Office: [phone] Pager: [phone]

## 2018-09-17 NOTE — Anesthesia Procedure Notes (Signed)
Procedure Name: MAC Date/Time: 09/17/2018 7:35 AM Performed by: Mariea Clonts, CRNA Pre-anesthesia Checklist: Patient identified, Emergency Drugs available, Suction available, Patient being monitored and Timeout performed Patient Re-evaluated:Patient Re-evaluated prior to induction Oxygen Delivery Method: Nasal cannula and Simple face mask

## 2018-09-17 NOTE — Discharge Instructions (Signed)
° °  [facility]  Discharge Instructions  AV Fistula or Graft Surgery for Dialysis Access  Please refer to the following instructions for your post-procedure care. Your surgeon or physician assistant will discuss any changes with you.  Activity  You may drive the day following your surgery, if you are comfortable and no longer taking prescription pain medication. Resume full activity as the soreness in your incision resolves.  Bathing/Showering  You may shower after you go home. Keep your incision dry for 48 hours. Do not soak in a bathtub, hot tub, or swim until the incision heals completely. You may not shower if you have a hemodialysis catheter.  Incision Care  Clean your incision with mild soap and water after 48 hours. Pat the area dry with a clean towel. You do not need a bandage unless otherwise instructed. Do not apply any ointments or creams to your incision. You may have skin glue on your incision. Do not peel it off. It will come off on its own in about one week. Your arm may swell a bit after surgery. To reduce swelling use pillows to elevate your arm so it is above your heart. Your doctor will tell you if you need to lightly wrap your arm with an ACE bandage.  Diet  Resume your normal diet. There are not special food restrictions following this procedure. In order to heal from your surgery, it is CRITICAL to get adequate nutrition. Your body requires vitamins, minerals, and protein. Vegetables are the best source of vitamins and minerals. Vegetables also provide the perfect balance of protein. Processed food has little nutritional value, so try to avoid this.  Medications  Resume taking all of your medications. If your incision is causing pain, you may take over-the counter pain relievers such as acetaminophen (Tylenol). If you were prescribed a stronger pain medication, please be aware these medications can cause nausea and constipation. Prevent  nausea by taking the medication with a snack or meal. Avoid constipation by drinking plenty of fluids and eating foods with high amount of fiber, such as fruits, vegetables, and grains.  Do not take Tylenol if you are taking prescription pain medications.  Follow up Your surgeon may want to see you in the office following your access surgery. If so, this will be arranged at the time of your surgery.  Please call us immediately for any of the following conditions:  Increased pain, redness, drainage (pus) from your incision site Fever of 101 degrees or higher Severe or worsening pain at your incision site Hand pain or numbness.  Reduce your risk of vascular disease:  Stop smoking. If you would like help, call [facility] at 1-800-QUIT-NOW 214-215-6490) or [place] at [place] your cholesterol Maintain a desired weight Control your diabetes Keep your blood pressure down  Dialysis  It will take several weeks to several months for your new dialysis access to be ready for use. Your surgeon will determine when it is okay to use it. Your nephrologist will continue to direct your dialysis. You can continue to use your Permcath until your new access is ready for use.   09/17/2018 Karen [name] [id number] [birth year]  Surgeon(s): Waynetta Sandy, MD  Procedure(s): 1ST STAGE BASILIC ARTERIOVENOUS (AV) FISTULA CREATION RIGHT ARM  x Do not stick fistula for 12 weeks    If you have any questions, please call the office at [phone].

## 2018-09-17 NOTE — Telephone Encounter (Signed)
spoke with patient, she saw it in her MyChart will mail reminder letter and paperwork

## 2018-09-17 NOTE — Op Note (Signed)
    Patient name: Karen [name] MRN: [id number] DOB: 1955-04-18 Sex: female  09/17/2018 Pre-operative Diagnosis: End-stage renal disease Post-operative diagnosis:  Same Surgeon:  Erlene Quan C. Donzetta Matters, MD Assistant: Leontine Locket PA Procedure Performed:   Right arm for stage basilic vein fistula creation  Indications: 63 year old female has a history of end-stage renal disease with bilateral upper extremity access.  Has a failed right arm graft most recent has a failed left arm graft.  Currently dialyzes via left IJ tunneled catheter.  Previously had right IJ port which is now been removed.  Has undergone central venography which demonstrates patency of her right-sided subclavian innominate veins draining into the right atrium without limitation.  She is indicated for right arm AV graft for possible fistula.  Findings: There was suitable basilic vein above the antecubitum on the right side.  Upon open exploration this was noted to be approximately 4 mm external diameter.  The vein appeared healthy was easily dilated to 4 mm and at completion of fistula creation there was a strong thrill and a strong radial artery signal at the wrist.   Procedure:  The patient was identified in the holding area and taken to the operating room where she is placed upon operative MAC anesthesia induced.  She was to the prepped draped in the right upper extremity usual fashion antibiotics were minister and timeout was called.  Ultrasound was used and we identified a basilic vein in the upper arm.  We also did identify the brachial artery above the antecubitum as well.  The area was anesthetized 1% lidocaine.  Transverse incision was made between the 2.  We dissected out the vein and this appeared to be suitable for fistula creation.  Was marked for orientation.  We dissected out the artery placed a vessel loop around this.  Branches of the vein were taken between clips and ties.  We transected distally spatulated and flushed with  heparinized saline easily dilated up to 4 mm.  We again flushed with heparinized and clamped.  The artery was clamped distally proximally opened longitudinally flushed heparinized saline both directions.  The vein was then sewn inside with 6-0 Prolene suture.  Upon completion we did have good flow through the vein.  We did after free of some soft tissue up the arm little bit to allow this to sit more smoothly.  Made a good signal in the vein as well as palpable thrill and there was a radial signal at the wrist that did not augment with compression of the vein.  Satisfied with this we irrigated and obtain hemostasis.  The wound was closed with Vicryl Monocryl dermal was placed to level skin.  She was awakened anesthesia having tolerated procedure without immediate complication.  All counts were correct at completion.  EBL: 20 cc   Zimal Weisensel C. Donzetta Matters, MD [facility] [place] Office: [phone] Pager: [phone]

## 2018-09-17 NOTE — Transfer of Care (Signed)
Immediate Anesthesia Transfer of Care Note  Patient: Karen [name]  Procedure(s) Performed: 1ST STAGE BASILIC ARTERIOVENOUS (AV) FISTULA CREATION RIGHT ARM (Right Arm Upper)  Patient Location: PACU  Anesthesia Type:MAC  Level of Consciousness: awake, alert  and oriented  Airway & Oxygen Therapy: Patient Spontanous Breathing and Patient connected to nasal cannula oxygen  Post-op Assessment: Report given to RN, Post -op Vital signs reviewed and stable and Patient moving all extremities X 4  Post vital signs: Reviewed and stable  Last Vitals:  Vitals Value Taken Time  BP 129/57 09/17/18 0843  Temp    Pulse 81 09/17/18 0845  Resp 13 09/17/18 0845  SpO2 100 % 09/17/18 0845  Vitals shown include unvalidated device data.  Last Pain:  Vitals:   09/17/18 0845  TempSrc:   PainSc: (P) Asleep      Patients Stated Pain Goal: 4 (15/72/62 0355)  Complications: No apparent anesthesia complications

## 2018-09-17 NOTE — Anesthesia Postprocedure Evaluation (Signed)
Anesthesia Post Note  Patient: Karen [name]  Procedure(s) Performed: 1ST STAGE BASILIC ARTERIOVENOUS (AV) FISTULA CREATION RIGHT ARM (Right Arm Upper)     Patient location during evaluation: PACU Anesthesia Type: MAC Level of consciousness: awake and alert and oriented Pain management: pain level controlled Vital Signs Assessment: post-procedure vital signs reviewed and stable Respiratory status: spontaneous breathing, nonlabored ventilation and respiratory function stable Cardiovascular status: blood pressure returned to baseline Postop Assessment: no apparent nausea or vomiting Anesthetic complications: no    Last Vitals:  Vitals:   09/17/18 0845 09/17/18 0854  BP: (!) 129/57 92/66  Pulse: 81   Resp: 13   Temp: 36.5 C   SpO2: 100%     Last Pain:  Vitals:   09/17/18 0854  TempSrc:   PainSc: 0-No pain                 Brennan Bailey

## 2018-09-18 ENCOUNTER — Encounter (HOSPITAL_COMMUNITY): Payer: Self-pay | Admitting: Vascular Surgery

## 2018-10-18 ENCOUNTER — Telehealth: Payer: Self-pay | Admitting: Adult Health

## 2018-10-18 NOTE — Telephone Encounter (Signed)
I talk with patient regarding video visit also move from 10/26 to 11/10

## 2018-10-30 ENCOUNTER — Other Ambulatory Visit: Payer: Self-pay

## 2018-10-30 DIAGNOSIS — N186 End stage renal disease: Secondary | ICD-10-CM

## 2018-10-30 DIAGNOSIS — Z992 Dependence on renal dialysis: Secondary | ICD-10-CM

## 2018-11-01 ENCOUNTER — Ambulatory Visit (HOSPITAL_COMMUNITY)
Admission: RE | Admit: 2018-11-01 | Discharge: 2018-11-01 | Disposition: A | Discharge status: Home / Self Care | Payer: Medicare Other | Source: Ambulatory Visit | Attending: Vascular Surgery | Admitting: Vascular Surgery

## 2018-11-01 ENCOUNTER — Encounter: Payer: Self-pay | Admitting: Vascular Surgery

## 2018-11-01 ENCOUNTER — Other Ambulatory Visit: Payer: Self-pay

## 2018-11-01 ENCOUNTER — Other Ambulatory Visit: Payer: Self-pay | Admitting: *Deleted

## 2018-11-01 ENCOUNTER — Encounter: Payer: Self-pay | Admitting: *Deleted

## 2018-11-01 ENCOUNTER — Ambulatory Visit (INDEPENDENT_AMBULATORY_CARE_PROVIDER_SITE_OTHER): Payer: Self-pay | Admitting: Vascular Surgery

## 2018-11-01 VITALS — BP 100/65 | HR 67 | Temp 97.8°F | Resp 18 | Ht 61.0 in | Wt 194.8 lb

## 2018-11-01 DIAGNOSIS — N186 End stage renal disease: Secondary | ICD-10-CM | POA: Insufficient documentation

## 2018-11-01 DIAGNOSIS — Z992 Dependence on renal dialysis: Secondary | ICD-10-CM

## 2018-11-01 NOTE — Progress Notes (Signed)
Patient ID: Karen [name], female   DOB: November 17, 1955, 63 y.o.   MRN: [id number]  Reason for Consult: Post-op Follow-up   Referred by Lucianne Lei, MD  Subjective:     HPI:  Karen [name] is a 63 y.o. female on dialysis Monday Wednesday Friday via left IJ catheter.  Previously is undergone a first stage right basilic vein fistula.  Also has had a graft on that side.  Since surgery her hand is doing very well.  She is not having any issues.  Past Medical History:  Diagnosis Date  . Anemia   . Arthritis    knees  . Breast cancer ([place]) 2019   Right Breast Cancer  . Cancer ([place])   . Chest pain Jan 2016   low risk Myoview   . CHF (congestive heart failure) ([place])   . Complication of anesthesia 2005   difficulty remembering for a while and waking up  . Constipation   . Dysrhythmia    h/o A-Fib  . ESRD on dialysis [facility]) April 2016   MWF [place]  . GERD (gastroesophageal reflux disease)   . Heart murmur Nov 2015   Aortic scleosis- no stenosis  . Hemodialysis patient ([place])   . Hypertension   . Shortness of breath dyspnea    with exertion   Family History  Problem Relation Age of Onset  . Kidney disease Mother   . Heart attack Father   . Kidney disease Father   . Diabetes Sister   . Hyperlipidemia Sister   . Hypertension Sister   . Kidney disease Sister        x2   Past Surgical History:  Procedure Laterality Date  . ABDOMINAL HYSTERECTOMY  2005  . AV FISTULA PLACEMENT Left 12/09/2013   Procedure: INSERTION OF ARTERIOVENOUS (AV) GORE-TEX GRAFT ARM;  Surgeon: Elam Dutch, MD;  Location: [facility] OR;  Service: Vascular;  Laterality: Left;  . AV FISTULA PLACEMENT Right 01/01/2018   Procedure: INSERTION OF ARTERIOVENOUS (AV) GORE-TEX GRAFT ARM RIGHT ARM;  Surgeon: Elam Dutch, MD;  Location: [place];  Service: Vascular;  Laterality: Right;  . AV FISTULA PLACEMENT Right 09/17/2018   Procedure: 1ST STAGE BASILIC ARTERIOVENOUS (AV) FISTULA CREATION RIGHT ARM;   Surgeon: Waynetta Sandy, MD;  Location: [place];  Service: Vascular;  Laterality: Right;  . BREAST BIOPSY  1990's  . BUNIONECTOMY Bilateral   . CHOLECYSTECTOMY  2005  . COLONOSCOPY    . IR GENERIC HISTORICAL  01/11/2016   IR US GUIDE VASC ACCESS RIGHT 01/11/2016 Corrie Mckusick, DO [facility]-INTERV RAD  . IR GENERIC HISTORICAL  01/11/2016   IR RADIOLOGY PERIPHERAL GUIDED IV START 01/11/2016 Corrie Mckusick, DO [facility]INTER[facility]  . IR IMAGING GUIDED PORT INSERTION  07/05/2017  . 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 KNEE ARTHROSCOPY Bilateral   . MASTECTOMY W/ SENTINEL NODE BIOPSY Right 11/20/2017  . MASTECTOMY W/ SENTINEL NODE BIOPSY Right 11/20/2017   Procedure: RIGHT MASTECTOMY WITH SENTINEL LYMPH NODE BIOPSY;  Surgeon: Coralie Keens, MD;  Location: [place];  Service: General;  Laterality: Right;  . PERIPHERAL VASCULAR CATHETERIZATION N/A 08/31/2015   Procedure: A/V Shuntogram;  Surgeon: Serafina Mitchell, MD;  Location: [place] CV LAB;  Service: Cardiovascular;  Laterality: N/A;  . PERIPHERAL VASCULAR CATHETERIZATION Left 08/31/2015   Procedure: Peripheral Vascular Balloon Angioplasty;  Surgeon: Serafina Mitchell, MD;  Location: [place] CV LAB;  Service: Cardiovascular;  Laterality: Left;  arm fistula  . PORT-A-CATH REMOVAL N/A  08/12/2018   Procedure: PORT-A-CATH REMOVAL;  Surgeon: Coralie Keens, MD;  Location: [place];  Service: General;  Laterality: N/A;  . ROTATOR CUFF REPAIR Right 1997  . UPPER EXTREMITY VENOGRAPHY Right 12/24/2017   Procedure: UPPER EXTREMITY VENOGRAPHY CENTRAL VENOGRAM;  Surgeon: Waynetta Sandy, MD;  Location: [place] CV LAB;  Service: Cardiovascular;  Laterality: Right;  . UPPER EXTREMITY VENOGRAPHY Bilateral 09/02/2018   Procedure: UPPER EXTREMITY VENOGRAPHY;  Surgeon: Waynetta Sandy, MD;  Location: [place] CV LAB;  Service: Cardiovascular;  Laterality: Bilateral;    Short Social History:  Social History   Tobacco Use  . Smoking  status: Never Smoker  . Smokeless tobacco: Never Used  Substance Use Topics  . Alcohol use: No    No Known Allergies  Current Outpatient Medications  Medication Sig Dispense Refill  . acetaminophen (TYLENOL) 500 MG tablet Take 1,000 mg by mouth daily as needed for mild pain, moderate pain or headache.     Marland Kitchen amoxicillin (AMOXIL) 500 MG capsule Take 2,000 mg by mouth See admin instructions. Take 4 capsules (2000 mg) by mouth one hour prior to dental procedures    . clobetasol cream (TEMOVATE) 5.40 % Apply 1 application topically daily as needed (for rash).     Marland Kitchen diltiazem (CARDIZEM) 30 MG tablet TAKE 1/2 TABLET EVERY 4 HOURS AS NEEDED FOR AFIB HEART RATE OVER 100 (Patient taking differently: Take 30 mg by mouth every Monday, Wednesday, and Friday. After dialysis) 45 tablet 1  . lidocaine-prilocaine (EMLA) cream Apply 1 application topically See admin instructions. Apply topically Monday, Wednesday and Friday before dialysis  6  . midodrine (PROAMATINE) 10 MG tablet TAKE 1 TABLET BY MOUTH TWICE A DAY AS DIRECTED ON HD DAYS    . multivitamin (RENA-VIT) TABS tablet Take 1 tablet by mouth daily.    . ondansetron (ZOFRAN) 8 MG tablet Take 8 mg by mouth every 8 (eight) hours as needed for nausea or vomiting.    Marland Kitchen oxyCODONE (ROXICODONE) 5 MG immediate release tablet Take 1 tablet (5 mg total) by mouth every 6 (six) hours as needed for severe pain. 8 tablet 0  . pantoprazole (PROTONIX) 40 MG tablet TAKE 1 TABLET (40 MG TOTAL) BY MOUTH DAILY. (Patient taking differently: Take 40 mg by mouth daily. ) 90 tablet 0  . prochlorperazine (COMPAZINE) 10 MG tablet Take 10 mg by mouth every 6 (six) hours as needed (Nausea or vomiting).    . sevelamer carbonate (RENVELA) 800 MG tablet Take 800-2,400 mg by mouth See admin instructions. Take 2400 mg by mouth 3 times daily with each meal and take 800 mg by mouth with snacks    . simvastatin (ZOCOR) 20 MG tablet TAKE 1 TABLET BY MOUTH EVERYDAY AT BEDTIME (Patient taking  differently: Take 20 mg by mouth at bedtime. ) 90 tablet 2   No current facility-administered medications for this visit.     Review of Systems  Constitutional:  Constitutional negative. HENT: HENT negative.  Eyes: Eyes negative.  Respiratory: Respiratory negative.  Cardiovascular: Cardiovascular negative.  GI: Gastrointestinal negative.  Musculoskeletal: Musculoskeletal negative.  Skin: Skin negative.  Neurological: Neurological negative. Hematologic: Hematologic/lymphatic negative.  Psychiatric: Psychiatric negative.        Objective:  Objective   Vitals:   11/01/18 1436  BP: 100/65  Pulse: 67  Resp: 18  Temp: 97.8 F (36.6 C)  SpO2: 100%  Weight: 194 lb 12.8 oz (88.4 kg)  Height: 5\' 1"  (1.549 m)   Body mass index is 36.81  kg/m.  Physical Exam HENT:     Head: Normocephalic.     Nose: Nose normal.     Mouth/Throat:     Mouth: Mucous membranes are moist.  Eyes:     Pupils: Pupils are equal, round, and reactive to light.  Cardiovascular:     Rate and Rhythm: Normal rate.     Pulses:          Radial pulses are 2+ on the right side and 2+ on the left side.  Pulmonary:     Effort: Pulmonary effort is normal.  Abdominal:     General: Abdomen is flat.  Musculoskeletal: Normal range of motion.        General: No swelling.     Comments: Strong thrill right medial upper arm  Skin:    General: Skin is warm and dry.     Capillary Refill: Capillary refill takes less than 2 seconds.  Neurological:     General: No focal deficit present.     Mental Status: She is alert.  Psychiatric:        Mood and Affect: Mood normal.     Data: I have independently interpreted her dialysis duplex.  Flow volume 1431 mL/min.  Diameter of the fistula 0.59 cm distally and proximally 0.98 cm.     Assessment/Plan:    63 year old female with end-stage renal disease on dialysis via catheter.  She is undergone right first stage basilic vein fistula creation.  Plan will be for second  stage on a nondialysis day in the near future.  I discussed the risk-benefit alternatives she demonstrates good understanding we will get her scheduled today.     Waynetta Sandy MD [facility]

## 2018-11-08 ENCOUNTER — Other Ambulatory Visit (HOSPITAL_COMMUNITY)
Admission: RE | Admit: 2018-11-08 | Discharge: 2018-11-08 | Disposition: A | Discharge status: Home / Self Care | Payer: Medicare Other | Source: Ambulatory Visit | Attending: Vascular Surgery | Admitting: Vascular Surgery

## 2018-11-08 DIAGNOSIS — Z01812 Encounter for preprocedural laboratory examination: Secondary | ICD-10-CM | POA: Insufficient documentation

## 2018-11-08 DIAGNOSIS — Z20828 Contact with and (suspected) exposure to other viral communicable diseases: Secondary | ICD-10-CM | POA: Insufficient documentation

## 2018-11-11 ENCOUNTER — Encounter (HOSPITAL_COMMUNITY): Payer: Self-pay | Admitting: *Deleted

## 2018-11-11 ENCOUNTER — Encounter: Payer: Medicare Other | Admitting: Adult Health

## 2018-11-11 ENCOUNTER — Other Ambulatory Visit: Payer: Self-pay

## 2018-11-11 LAB — NOVEL CORONAVIRUS, NAA (HOSP ORDER, SEND-OUT TO REF LAB; TAT 18-24 HRS): SARS-CoV-2, NAA: NOT DETECTED

## 2018-11-11 NOTE — Progress Notes (Signed)
Spoke with pt for pre-op call. Pt has hx of A-fib and a heart murmur. Denies any recent chest pain or sob. Last cardiology visit was in August with Coletta Memos, NP. Dr. Gwenlyn Found is her cardiologist. Pt states she is not diabetic.   Pt had her Covid test done on 11/08/27 and it is negative. Pt states she has been in quarantine since except for going to Dialysis.

## 2018-11-12 ENCOUNTER — Ambulatory Visit (HOSPITAL_COMMUNITY): Payer: Medicare Other | Admitting: Certified Registered Nurse Anesthetist

## 2018-11-12 ENCOUNTER — Encounter (HOSPITAL_COMMUNITY): Payer: Self-pay

## 2018-11-12 ENCOUNTER — Ambulatory Visit (HOSPITAL_COMMUNITY)
Admission: RE | Admit: 2018-11-12 | Discharge: 2018-11-12 | Disposition: A | Discharge status: Home / Self Care | Payer: Medicare Other | Attending: Vascular Surgery | Admitting: Vascular Surgery

## 2018-11-12 ENCOUNTER — Other Ambulatory Visit: Payer: Self-pay

## 2018-11-12 ENCOUNTER — Encounter (HOSPITAL_COMMUNITY)
Admission: RE | Disposition: A | Discharge status: Home / Self Care | Payer: Self-pay | Source: Home / Self Care | Attending: Vascular Surgery

## 2018-11-12 DIAGNOSIS — I509 Heart failure, unspecified: Secondary | ICD-10-CM | POA: Insufficient documentation

## 2018-11-12 DIAGNOSIS — Z992 Dependence on renal dialysis: Secondary | ICD-10-CM | POA: Insufficient documentation

## 2018-11-12 DIAGNOSIS — I132 Hypertensive heart and chronic kidney disease with heart failure and with stage 5 chronic kidney disease, or end stage renal disease: Secondary | ICD-10-CM | POA: Diagnosis not present

## 2018-11-12 DIAGNOSIS — N185 Chronic kidney disease, stage 5: Secondary | ICD-10-CM

## 2018-11-12 DIAGNOSIS — Z79899 Other long term (current) drug therapy: Secondary | ICD-10-CM | POA: Diagnosis not present

## 2018-11-12 DIAGNOSIS — K219 Gastro-esophageal reflux disease without esophagitis: Secondary | ICD-10-CM | POA: Insufficient documentation

## 2018-11-12 DIAGNOSIS — Z853 Personal history of malignant neoplasm of breast: Secondary | ICD-10-CM | POA: Insufficient documentation

## 2018-11-12 DIAGNOSIS — N186 End stage renal disease: Secondary | ICD-10-CM | POA: Insufficient documentation

## 2018-11-12 HISTORY — PX: BASCILIC VEIN TRANSPOSITION: SHX5742

## 2018-11-12 LAB — POCT I-STAT, CHEM 8
BUN: 26 mg/dL — ABNORMAL HIGH (ref 8–23)
Calcium, Ion: 0.92 mmol/L — ABNORMAL LOW (ref 1.15–1.40)
Chloride: 95 mmol/L — ABNORMAL LOW (ref 98–111)
Creatinine, Ser: 7.1 mg/dL — ABNORMAL HIGH (ref 0.44–1.00)
Glucose, Bld: 89 mg/dL (ref 70–99)
HCT: 35 % — ABNORMAL LOW (ref 36.0–46.0)
Hemoglobin: 11.9 g/dL — ABNORMAL LOW (ref 12.0–15.0)
Potassium: 3.8 mmol/L (ref 3.5–5.1)
Sodium: 138 mmol/L (ref 135–145)
TCO2: 29 mmol/L (ref 22–32)

## 2018-11-12 SURGERY — TRANSPOSITION, VEIN, BASILIC
Anesthesia: Monitor Anesthesia Care | Laterality: Right

## 2018-11-12 MED ORDER — SODIUM CHLORIDE 0.9 % IV SOLN
INTRAVENOUS | Status: AC
  Filled 2018-11-12: qty 1.2

## 2018-11-12 MED ORDER — HEPARIN SODIUM (PORCINE) 1000 UNIT/ML IJ SOLN
INTRAMUSCULAR | Status: AC
  Filled 2018-11-12: qty 1

## 2018-11-12 MED ORDER — LIDOCAINE-EPINEPHRINE (PF) 1 %-1:200000 IJ SOLN
INTRAMUSCULAR | Status: DC | PRN
  Administered 2018-11-12: 20 mL

## 2018-11-12 MED ORDER — FENTANYL CITRATE (PF) 100 MCG/2ML IJ SOLN
INTRAMUSCULAR | Status: DC | PRN
  Administered 2018-11-12 (×2): 25 ug via INTRAVENOUS
  Administered 2018-11-12: 50 ug via INTRAVENOUS
  Administered 2018-11-12: 25 ug via INTRAVENOUS

## 2018-11-12 MED ORDER — 0.9 % SODIUM CHLORIDE (POUR BTL) OPTIME
TOPICAL | Status: DC | PRN
  Administered 2018-11-12: 1000 mL

## 2018-11-12 MED ORDER — FENTANYL CITRATE (PF) 100 MCG/2ML IJ SOLN
INTRAMUSCULAR | Status: AC
  Filled 2018-11-12: qty 2

## 2018-11-12 MED ORDER — LIDOCAINE HCL 1 % IJ SOLN
INTRAMUSCULAR | Status: AC
  Filled 2018-11-12: qty 20

## 2018-11-12 MED ORDER — SODIUM CHLORIDE 0.9 % IV SOLN
INTRAVENOUS | Status: DC | PRN
  Administered 2018-11-12: 500 mL

## 2018-11-12 MED ORDER — ACETAMINOPHEN 500 MG PO TABS: 1000.0000 mg | ORAL_TABLET | Freq: Once | ORAL | Status: DC | PRN

## 2018-11-12 MED ORDER — ONDANSETRON HCL 4 MG/2ML IJ SOLN
INTRAMUSCULAR | Status: DC | PRN
  Administered 2018-11-12: 4 mg via INTRAVENOUS

## 2018-11-12 MED ORDER — OXYCODONE HCL 5 MG/5ML PO SOLN: 5.0000 mg | Freq: Once | ORAL | Status: DC | PRN

## 2018-11-12 MED ORDER — FENTANYL CITRATE (PF) 250 MCG/5ML IJ SOLN
INTRAMUSCULAR | Status: AC
  Filled 2018-11-12: qty 5

## 2018-11-12 MED ORDER — HYDROCODONE-ACETAMINOPHEN 5-325 MG PO TABS: 1.0000 | ORAL_TABLET | Freq: Four times a day (QID) | ORAL | 0 refills | Status: DC | PRN

## 2018-11-12 MED ORDER — OXYCODONE HCL 5 MG PO TABS: 5.0000 mg | ORAL_TABLET | Freq: Once | ORAL | Status: DC | PRN

## 2018-11-12 MED ORDER — CHLORHEXIDINE GLUCONATE 4 % EX LIQD: 60.0000 mL | Freq: Once | CUTANEOUS | Status: DC

## 2018-11-12 MED ORDER — ACETAMINOPHEN 160 MG/5ML PO SOLN: 1000.0000 mg | Freq: Once | ORAL | Status: DC | PRN

## 2018-11-12 MED ORDER — PHENYLEPHRINE 40 MCG/ML (10ML) SYRINGE FOR IV PUSH (FOR BLOOD PRESSURE SUPPORT)
PREFILLED_SYRINGE | INTRAVENOUS | Status: AC
  Filled 2018-11-12: qty 10

## 2018-11-12 MED ORDER — PROPOFOL 10 MG/ML IV BOLUS
INTRAVENOUS | Status: DC | PRN
  Administered 2018-11-12: 10 mg via INTRAVENOUS

## 2018-11-12 MED ORDER — ONDANSETRON HCL 4 MG/2ML IJ SOLN
INTRAMUSCULAR | Status: AC
  Filled 2018-11-12: qty 2

## 2018-11-12 MED ORDER — CEFAZOLIN SODIUM-DEXTROSE 2-4 GM/100ML-% IV SOLN
2.0000 g | INTRAVENOUS | Status: AC
  Administered 2018-11-12: 08:00:00 2 g via INTRAVENOUS
  Filled 2018-11-12: qty 100

## 2018-11-12 MED ORDER — ALBUMIN HUMAN 5 % IV SOLN
12.5000 g | Freq: Once | INTRAVENOUS | Status: AC
  Administered 2018-11-12: 12.5 g via INTRAVENOUS

## 2018-11-12 MED ORDER — PROPOFOL 500 MG/50ML IV EMUL
INTRAVENOUS | Status: DC | PRN
  Administered 2018-11-12: 75 ug/kg/min via INTRAVENOUS

## 2018-11-12 MED ORDER — ACETAMINOPHEN 10 MG/ML IV SOLN: 1000.0000 mg | Freq: Once | INTRAVENOUS | Status: DC | PRN

## 2018-11-12 MED ORDER — PROPOFOL 10 MG/ML IV BOLUS
INTRAVENOUS | Status: AC
  Filled 2018-11-12: qty 20

## 2018-11-12 MED ORDER — SODIUM CHLORIDE 0.9 % IV SOLN
INTRAVENOUS | Status: DC | PRN
  Administered 2018-11-12 (×2): via INTRAVENOUS

## 2018-11-12 MED ORDER — SODIUM CHLORIDE 0.9 % IV SOLN: INTRAVENOUS | Status: DC

## 2018-11-12 MED ORDER — SODIUM CHLORIDE 0.9 % IV SOLN
INTRAVENOUS | Status: DC | PRN
  Administered 2018-11-12: 50 ug/min via INTRAVENOUS

## 2018-11-12 MED ORDER — FENTANYL CITRATE (PF) 100 MCG/2ML IJ SOLN
25.0000 ug | INTRAMUSCULAR | Status: DC | PRN
  Administered 2018-11-12: 25 ug via INTRAVENOUS

## 2018-11-12 MED ORDER — ALBUMIN HUMAN 5 % IV SOLN
INTRAVENOUS | Status: AC
  Filled 2018-11-12: qty 250

## 2018-11-12 MED ORDER — LIDOCAINE-EPINEPHRINE (PF) 1 %-1:200000 IJ SOLN
INTRAMUSCULAR | Status: AC
  Filled 2018-11-12: qty 30

## 2018-11-12 SURGICAL SUPPLY — 34 items
ARMBAND PINK RESTRICT EXTREMIT (MISCELLANEOUS) ×3 IMPLANT
CANISTER SUCT 3000ML PPV (MISCELLANEOUS) ×5 IMPLANT
CLIP VESOCCLUDE MED 24/CT (CLIP) ×2 IMPLANT
CLIP VESOCCLUDE MED 6/CT (CLIP) IMPLANT
CLIP VESOCCLUDE SM WIDE 24/CT (CLIP) ×2 IMPLANT
CLIP VESOCCLUDE SM WIDE 6/CT (CLIP) IMPLANT
COVER PROBE W GEL 5X96 (DRAPES) ×3 IMPLANT
COVER WAND RF STERILE (DRAPES) ×1 IMPLANT
DERMABOND ADVANCED (GAUZE/BANDAGES/DRESSINGS) ×2
DERMABOND ADVANCED .7 DNX12 (GAUZE/BANDAGES/DRESSINGS) ×1 IMPLANT
ELECT REM PT RETURN 9FT ADLT (ELECTROSURGICAL) ×3
ELECTRODE REM PT RTRN 9FT ADLT (ELECTROSURGICAL) ×1 IMPLANT
GLOVE BIO SURGEON STRL SZ7.5 (GLOVE) ×5 IMPLANT
GLOVE BIOGEL PI IND STRL 6.5 (GLOVE) IMPLANT
GLOVE BIOGEL PI IND STRL 7.0 (GLOVE) IMPLANT
GLOVE BIOGEL PI INDICATOR 6.5 (GLOVE) ×2
GLOVE BIOGEL PI INDICATOR 7.0 (GLOVE) ×2
GOWN STRL REUS W/ TWL LRG LVL3 (GOWN DISPOSABLE) ×2 IMPLANT
GOWN STRL REUS W/ TWL XL LVL3 (GOWN DISPOSABLE) ×1 IMPLANT
GOWN STRL REUS W/TWL LRG LVL3 (GOWN DISPOSABLE) ×6
GOWN STRL REUS W/TWL XL LVL3 (GOWN DISPOSABLE) ×4
KIT BASIN OR (CUSTOM PROCEDURE TRAY) ×3 IMPLANT
KIT TURNOVER KIT B (KITS) ×3 IMPLANT
NS IRRIG 1000ML POUR BTL (IV SOLUTION) ×3 IMPLANT
PACK CV ACCESS (CUSTOM PROCEDURE TRAY) ×3 IMPLANT
PAD ARMBOARD 7.5X6 YLW CONV (MISCELLANEOUS) ×6 IMPLANT
SUT MNCRL AB 4-0 PS2 18 (SUTURE) ×7 IMPLANT
SUT PROLENE 6 0 BV (SUTURE) ×7 IMPLANT
SUT SILK 2 0 SH (SUTURE) IMPLANT
SUT VIC AB 3-0 SH 27 (SUTURE) ×6
SUT VIC AB 3-0 SH 27X BRD (SUTURE) ×1 IMPLANT
TOWEL GREEN STERILE (TOWEL DISPOSABLE) ×3 IMPLANT
UNDERPAD 30X30 (UNDERPADS AND DIAPERS) ×5 IMPLANT
WATER STERILE IRR 1000ML POUR (IV SOLUTION) ×3 IMPLANT

## 2018-11-12 NOTE — Transfer of Care (Signed)
Immediate Anesthesia Transfer of Care Note  Patient: Karen [name]  Procedure(s) Performed: SECOND STAGE BASILIC VEIN TRANSPOSITION RIGHT ARM (Right )  Patient Location: PACU  Anesthesia Type:MAC  Level of Consciousness: awake, alert  and oriented  Airway & Oxygen Therapy: Patient Spontanous Breathing  Post-op Assessment: Report given to RN and Post -op Vital signs reviewed and stable  Post vital signs: Reviewed and stable  Last Vitals:  Vitals Value Taken Time  BP 120/100 11/12/18 0937  Temp    Pulse 74 11/12/18 0943  Resp 17 11/12/18 0943  SpO2 99 % 11/12/18 0943  Vitals shown include unvalidated device data.  Last Pain:  Vitals:   11/12/18 0937  TempSrc:   PainSc: (P) 10-Worst pain ever      Patients Stated Pain Goal: 5 (59/16/38 4665)  Complications: No apparent anesthesia complications

## 2018-11-12 NOTE — H&P (Signed)
   History and Physical Update  The patient was interviewed and re-examined.  The patient's previous History and Physical has been reviewed and is unchanged from recent office visit. Plan for right arm 2nd stage bvt today in OR.   Victoria Henshaw C. Donzetta Matters, MD [facility] [place] Office: [phone] Pager: [phone]  11/12/2018, 7:18 AM

## 2018-11-12 NOTE — Op Note (Signed)
    Patient name: Karen [name] MRN: [id number] DOB: [birth year] Sex: female  11/12/2018 Pre-operative Diagnosis: End-stage renal disease Post-operative diagnosis:  Same Surgeon:  Erlene Quan C. Donzetta Matters, MD Assistant: Arlee Muslim, PA Procedure Performed:  Right arm second stage basilic vein transposition fistula  Indications: 63 year old female history of end-stage renal disease currently on dialysis via catheter.  She has had a right upper arm graft.  Now has a right upper arm basilic vein fistula creation that has matured nicely.  She is indicated for second stage.  Findings: Basilic vein was approximately 1 cm throughout the course.  At its most cephalad point was actually attached to the previous graft had very minimal outflow.  The majority the outflow was from deep connecting branches that were quite large.  We mobilized the axillary vein disconnected the graft and tied these off.  We then tunneled the fistula in the opposite direction typical and sewed end-to-end to the axillary vein.  At completion there was a strong thrill and a good signal at the radial artery at the wrist that did augment with compression of the graft.   Procedure:  The patient was identified in the holding area and taken to the operating room where she was put supine operative when MAC anesthesia induced.  She was sterilely prepped and draped in the right upper extremity usual fashion antibiotics were minister and timeout was called.  Ultrasound was used to identify the fistula.  Initially made an incision at her old incision dissected down to the fistula.  We made 2 separate skip incisions up to the axilla.  We dissected out the vein entirely divided branches between clips and ties.  In the axilla we identified that the fistula.  Run head long into the graft.  Fistula is likely patent from deep perforating branches only.  We mobilized the axillary vein marked this for orientation.  We then clamped the fistula proximally  transected in the axilla.  This was flushed and marked for orientation.  We did have to divide a few branches of the nerve to pull the fistula through this there were multiple branches of this.  We then spatulated our axillary vein flushed with heparinized saline.  We tunneled our fistula from a distal to proximal orientation.  We then flushed it with heparinized saline.  We sewed end and with 6-0 Prolene suture to the axillary vein.  Upon completion we had a very strong thrill throughout the fistula.  We irrigated the wounds and obtain hemostasis and closed in layers of Vicryl Monocryl.  She was awake from anesthesia having tolerated procedure well immediate complication.  All counts were correct at completion.  EBL: 100 cc   Shannah Conteh C. Donzetta Matters, MD [facility] [place] Office: [phone] Pager: [phone]

## 2018-11-12 NOTE — Anesthesia Preprocedure Evaluation (Signed)
Anesthesia Evaluation  Patient identified by MRN, date of birth, ID band Patient awake    Reviewed: Allergy & Precautions, NPO status , Patient's Chart, lab work & pertinent test results  History of Anesthesia Complications (+) DIFFICULT IV STICK / SPECIAL LINE and history of anesthetic complications  Airway Mallampati: II  TM Distance: >3 FB Neck ROM: Full    Dental  (+) Poor Dentition, Missing,    Pulmonary neg pulmonary ROS,    Pulmonary exam normal        Cardiovascular hypertension, Pt. on medications +CHF  Normal cardiovascular exam+ dysrhythmias Atrial Fibrillation   TTE 03/25/18: EF 60-65%, RVSP mildly elevated at 38.74mHg, mild LAE, mild-moderate TVR   Neuro/Psych negative neurological ROS     GI/Hepatic Neg liver ROS, GERD  Medicated and Controlled,  Endo/Other  negative endocrine ROS  Renal/GU ESRF and DialysisRenal disease     Musculoskeletal negative musculoskeletal ROS (+)   Abdominal   Peds  Hematology  (+) anemia ,   Anesthesia Other Findings Day of surgery medications reviewed with the patient.  Reproductive/Obstetrics                             Anesthesia Physical Anesthesia Plan  ASA: IV  Anesthesia Plan: MAC   Post-op Pain Management:    Induction: Intravenous  PONV Risk Score and Plan: 2 and Treatment may vary due to age or medical condition and Propofol infusion  Airway Management Planned: Nasal Cannula  Additional Equipment: None  Intra-op Plan:   Post-operative Plan:   Informed Consent: I have reviewed the patients History and Physical, chart, labs and discussed the procedure including the risks, benefits and alternatives for the proposed anesthesia with the patient or authorized representative who has indicated his/her understanding and acceptance.     Dental advisory given  Plan Discussed with: CRNA and Surgeon  Anesthesia Plan Comments:          Anesthesia Quick Evaluation

## 2018-11-12 NOTE — Discharge Instructions (Signed)
° °  [facility] [place] ° °Discharge Instructions ° °AV Fistula or Graft Surgery for Dialysis Access ° °Please refer to the following instructions for your post-procedure care. Your surgeon or physician assistant will discuss any changes with you. ° °Activity ° °You may drive the day following your surgery, if you are comfortable and no longer taking prescription pain medication. Resume full activity as the soreness in your incision resolves. ° °Bathing/Showering ° °You may shower after you go home. Keep your incision dry for 48 hours. Do not soak in a bathtub, hot tub, or swim until the incision heals completely. You may not shower if you have a hemodialysis catheter. ° °Incision Care ° °Clean your incision with mild soap and water after 48 hours. Pat the area dry with a clean towel. You do not need a bandage unless otherwise instructed. Do not apply any ointments or creams to your incision. You may have skin glue on your incision. Do not peel it off. It will come off on its own in about one week. Your arm may swell a bit after surgery. To reduce swelling use pillows to elevate your arm so it is above your heart. Your doctor will tell you if you need to lightly wrap your arm with an ACE bandage. ° °Diet ° °Resume your normal diet. There are not special food restrictions following this procedure. In order to heal from your surgery, it is CRITICAL to get adequate nutrition. Your body requires vitamins, minerals, and protein. Vegetables are the best source of vitamins and minerals. Vegetables also provide the perfect balance of protein. Processed food has little nutritional value, so try to avoid this. ° °Medications ° °Resume taking all of your medications. If your incision is causing pain, you may take over-the counter pain relievers such as acetaminophen (Tylenol). If you were prescribed a stronger pain medication, please be aware these medications can cause nausea and constipation. Prevent  nausea by taking the medication with a snack or meal. Avoid constipation by drinking plenty of fluids and eating foods with high amount of fiber, such as fruits, vegetables, and grains. Do not take Tylenol if you are taking prescription pain medications. ° ° ° ° °Follow up °Your surgeon may want to see you in the office following your access surgery. If so, this will be arranged at the time of your surgery. ° °Please call us immediately for any of the following conditions: ° °Increased pain, redness, drainage (pus) from your incision site °Fever of 101 degrees or higher °Severe or worsening pain at your incision site °Hand pain or numbness. ° °Reduce your risk of vascular disease: ° °Stop smoking. If you would like help, call [facility] at 1-800-QUIT-NOW (1-800-784-8669) or [place] at 336-586-4000 ° °Manage your cholesterol °Maintain a desired weight °Control your diabetes °Keep your blood pressure down ° °Dialysis ° °It will take several weeks to several months for your new dialysis access to be ready for use. Your surgeon will determine when it is OK to use it. Your nephrologist will continue to direct your dialysis. You can continue to use your Permcath until your new access is ready for use. ° °If you have any questions, please call the office at [phone]. ° °

## 2018-11-12 NOTE — Anesthesia Procedure Notes (Signed)
Procedure Name: MAC Date/Time: 11/12/2018 7:35 AM Performed by: Inda Coke, CRNA Pre-anesthesia Checklist: Patient identified, Emergency Drugs available, Suction available, Timeout performed and Patient being monitored Patient Re-evaluated:Patient Re-evaluated prior to induction Oxygen Delivery Method: Simple face mask Induction Type: IV induction Dental Injury: Teeth and Oropharynx as per pre-operative assessment

## 2018-11-13 ENCOUNTER — Encounter (HOSPITAL_COMMUNITY): Payer: Self-pay | Admitting: Vascular Surgery

## 2018-11-13 NOTE — Anesthesia Postprocedure Evaluation (Signed)
Anesthesia Post Note  Patient: Taffie Eckmann  Procedure(s) Performed: SECOND STAGE BASILIC VEIN TRANSPOSITION RIGHT ARM (Right )     Patient location during evaluation: PACU Anesthesia Type: MAC Level of consciousness: awake and alert Pain management: pain level controlled Vital Signs Assessment: post-procedure vital signs reviewed and stable Respiratory status: spontaneous breathing, nonlabored ventilation, respiratory function stable and patient connected to nasal cannula oxygen Cardiovascular status: stable and blood pressure returned to baseline Postop Assessment: no apparent nausea or vomiting Anesthetic complications: no    Last Vitals:  Vitals:   11/12/18 1101 11/12/18 1105  BP: (!) 104/55   Pulse: 83 81  Resp: 11 15  Temp:  (!) 36.1 C  SpO2: 100% 100%    Last Pain:  Vitals:   11/12/18 1030  TempSrc:   PainSc: Asleep                 Ajani Rineer

## 2018-11-14 ENCOUNTER — Other Ambulatory Visit: Payer: Self-pay | Admitting: Cardiovascular Disease

## 2018-11-18 ENCOUNTER — Telehealth: Payer: Self-pay | Admitting: Vascular Surgery

## 2018-11-18 NOTE — Telephone Encounter (Signed)
Received paper work from [employer] asking that patient be seen before her Post op appointment on 12/06/2018, I spoke with Wilfred Curtis she ask that I get the patient scheduled for a [employer] [place] and an appointment with PA, I called patient to get her scheduled for this appointment. She informed me that she spoke with a triage nurse and she has given her medical advice to follow. If she has any more concerns or problems she will call back.   [facility]

## 2018-11-25 ENCOUNTER — Telehealth: Payer: Self-pay

## 2018-11-25 NOTE — Telephone Encounter (Signed)
RN returned call regarding questions about appointments.  Pt would like to cancel survivorship appointment on 11/10, RN offered rescheduling.  Pt stated she would hold off until following up with MD in December.

## 2018-11-26 ENCOUNTER — Telehealth: Payer: Medicare Other | Admitting: Adult Health

## 2018-12-06 ENCOUNTER — Ambulatory Visit (INDEPENDENT_AMBULATORY_CARE_PROVIDER_SITE_OTHER): Payer: Self-pay | Admitting: Physician Assistant

## 2018-12-06 ENCOUNTER — Other Ambulatory Visit: Payer: Self-pay

## 2018-12-06 VITALS — BP 128/74 | HR 68 | Temp 98.0°F | Resp 14 | Ht 65.0 in | Wt 194.3 lb

## 2018-12-06 DIAGNOSIS — N186 End stage renal disease: Secondary | ICD-10-CM

## 2018-12-06 DIAGNOSIS — Z992 Dependence on renal dialysis: Secondary | ICD-10-CM

## 2018-12-06 NOTE — Progress Notes (Signed)
POST OPERATIVE OFFICE NOTE    CC:  F/u for surgery  HPI:  This is a 63 y.o. female who is s/p right second stage basilic fistula. She had 2 previous AV graft.  She denise fever and chills.  She does have pain and edema surrounding the right UE fistula.  There is numbness on the volar surface of the forearm.    No Known Allergies  Current Outpatient Medications  Medication Sig Dispense Refill  . acetaminophen (TYLENOL) 500 MG tablet Take 1,000 mg by mouth daily as needed for mild pain, moderate pain or headache.     Marland Kitchen amoxicillin (AMOXIL) 500 MG capsule Take 2,000 mg by mouth See admin instructions. Take 4 capsules (2000 mg) by mouth one hour prior to dental procedures    . clobetasol cream (TEMOVATE) 1.61 % Apply 1 application topically daily as needed (for rash).     Marland Kitchen diltiazem (CARDIZEM) 30 MG tablet TAKE 1/2 TABLET EVERY 4 HOURS AS NEEDED FOR AFIB HEART RATE OVER 100 (Patient taking differently: Take 30 mg by mouth See admin instructions. Take 1 tablet (30 mg) by mouth after dialysis on Mondays, Wednesdays, & Fridays.) 45 tablet 1  . HYDROcodone-acetaminophen (NORCO) 5-325 MG tablet Take 1 tablet by mouth every 6 (six) hours as needed for moderate pain. 20 tablet 0  . lidocaine-prilocaine (EMLA) cream Apply 1 application topically See admin instructions. Apply topically Monday, Wednesday and Friday before dialysis  6  . midodrine (PROAMATINE) 10 MG tablet Take 10 mg by mouth See admin instructions. Take 1 tablet (10 mg) by mouth at beginning of dialysis & take 1 tablet (10 mg) by mouth nearing the end of dialysis on Mondays, Wednesdays, & Fridays    . multivitamin (RENA-VIT) TABS tablet Take 1 tablet by mouth daily in the afternoon.     . ondansetron (ZOFRAN) 8 MG tablet Take 8 mg by mouth every 8 (eight) hours as needed for nausea or vomiting.    . pantoprazole (PROTONIX) 40 MG tablet TAKE 1 TABLET (40 MG TOTAL) BY MOUTH DAILY. 90 tablet 0  . prochlorperazine (COMPAZINE) 10 MG tablet  Take 10 mg by mouth every 6 (six) hours as needed (Nausea or vomiting).    . sevelamer carbonate (RENVELA) 800 MG tablet Take 800 mg by mouth See admin instructions. Take 1 tablet (600 mg) by mouth with each meal & 1 tablet (800 mg) by mouth with each snack.    . simvastatin (ZOCOR) 20 MG tablet TAKE 1 TABLET BY MOUTH EVERYDAY AT BEDTIME (Patient taking differently: Take 20 mg by mouth at bedtime. ) 90 tablet 2   No current facility-administered medications for this visit.      ROS:  See HPI  Physical Exam:    Incision:  Vein harvest incisions are healing well.  The fistula is pulsatile.  Doppler with brisk thrill.  Firmness/edema and pain surrounding the fistula transposition.   Extremities:   Motor in the hand grip 5/5, sensation in the fingers intact.     Assessment/Plan:  This is a 63 y.o. female who is s/p: Second stage right basilic vein fistula.  She is fairly tender to palpation with edema surrounding tunnel basilic vein.  I will bring her back for re evaluation of the fistula in 2-3 weeks.  Due to the flow feeling pulsatile she may need a fistulogram to evaluate the outflow.  [facility] HD [facility].     Roxy Horseman, PA-C [facility] [phone]  Clinic MD:  Donzetta Matters

## 2018-12-18 NOTE — Progress Notes (Addendum)
POST OPERATIVE OFFICE NOTE    CC:  F/u for surgery  HPI:  This is a 63 y.o. female who is s/p right 2nd stage BVT by Dr. Donzetta Matters on 11/12/2018.  Her first stage BVT was on 09/17/2018 also by Dr. Donzetta Matters. She was seen on 12/06/2018 and at that time was found to be fairly tender to palpation with edema surrounding the tunneled area.  The fistula felt pulsatile and given the edema, she was scheduled to return for follow up.  She presents today for follow up.  She still has some edema in her upper arm and some tenderness, but this is improving.  She states her arm feels heavy underneath.  She does have some numbness down her forearm but this has also improved somewhat.  She does not have pain or numbness in her right hand.  Of note, she had a venogram on 09/02/2018 by Dr. Donzetta Matters and was found to have a right central venous system that was patent and fills right atrium briskly.  Left side appears occluded at the site of her [facility].   She dialyzes M/W/F at the [facility] location.  No Known Allergies  Current Outpatient Medications  Medication Sig Dispense Refill  . acetaminophen (TYLENOL) 500 MG tablet Take 1,000 mg by mouth daily as needed for mild pain, moderate pain or headache.     Marland Kitchen amoxicillin (AMOXIL) 500 MG capsule Take 2,000 mg by mouth See admin instructions. Take 4 capsules (2000 mg) by mouth one hour prior to dental procedures    . clobetasol cream (TEMOVATE) 5.46 % Apply 1 application topically daily as needed (for rash).     Marland Kitchen diltiazem (CARDIZEM) 30 MG tablet TAKE 1/2 TABLET EVERY 4 HOURS AS NEEDED FOR AFIB HEART RATE OVER 100 (Patient taking differently: Take 30 mg by mouth See admin instructions. Take 1 tablet (30 mg) by mouth after dialysis on Mondays, Wednesdays, & Fridays.) 45 tablet 1  . HYDROcodone-acetaminophen (NORCO) 5-325 MG tablet Take 1 tablet by mouth every 6 (six) hours as needed for moderate pain. 20 tablet 0  . lidocaine-prilocaine (EMLA) cream Apply 1 application topically  See admin instructions. Apply topically Monday, Wednesday and Friday before dialysis  6  . midodrine (PROAMATINE) 10 MG tablet Take 10 mg by mouth See admin instructions. Take 1 tablet (10 mg) by mouth at beginning of dialysis & take 1 tablet (10 mg) by mouth nearing the end of dialysis on Mondays, Wednesdays, & Fridays    . multivitamin (RENA-VIT) TABS tablet Take 1 tablet by mouth daily in the afternoon.     . ondansetron (ZOFRAN) 8 MG tablet Take 8 mg by mouth every 8 (eight) hours as needed for nausea or vomiting.    . pantoprazole (PROTONIX) 40 MG tablet TAKE 1 TABLET (40 MG TOTAL) BY MOUTH DAILY. 90 tablet 0  . prochlorperazine (COMPAZINE) 10 MG tablet Take 10 mg by mouth every 6 (six) hours as needed (Nausea or vomiting).    . sevelamer carbonate (RENVELA) 800 MG tablet Take 800 mg by mouth See admin instructions. Take 1 tablet (600 mg) by mouth with each meal & 1 tablet (800 mg) by mouth with each snack.    . simvastatin (ZOCOR) 20 MG tablet TAKE 1 TABLET BY MOUTH EVERYDAY AT BEDTIME (Patient taking differently: Take 20 mg by mouth at bedtime. ) 90 tablet 2   No current facility-administered medications for this visit.      ROS:  See HPI  Physical Exam:  Today's Vitals  12/19/18 1351  BP: 105/64  Pulse: 84  Resp: 16  Temp: (!) 97 F (36.1 C)  TempSrc: Temporal  SpO2: 100%  Weight: 194 lb (88 kg)  Height: 5\' 1"  (1.549 m)   Body mass index is 36.66 kg/m.   Incision:  All have healed nicely Extremities:  + palpable right radial pulse; motor and sensation are in tact right hand.  There is an excellent thrill in the fistula and is easily palpable.     Assessment/Plan:  This is a 63 y.o. female who is s/p: right 2nd stage BVT by Dr. Donzetta Matters on 11/12/2018.  Her first stage BVT was on 09/17/2018 also by Dr. Donzetta Matters.   -pt doing well with patent right BVT fistula.  She still has some swelling around the tunneled site.  Also the heaviness under her arm most likely from gravity from  oozing from surgery.  Her body should absorb this over time.  The numbness in her forearm most likely from nerve irritation and should improve over time.   -would give the fistula another 4 weeks before attempting to use.  Pt is in agreement with this. -her incisions are all healing well.   -she does not have evidence of steal sx -she will f/u with [place] as needed.     Leontine Locket, PA-C [facility] [phone]  Clinic MD:  Oneida Alar

## 2018-12-19 ENCOUNTER — Ambulatory Visit (INDEPENDENT_AMBULATORY_CARE_PROVIDER_SITE_OTHER): Payer: Self-pay | Admitting: Physician Assistant

## 2018-12-19 ENCOUNTER — Other Ambulatory Visit: Payer: Self-pay

## 2018-12-19 VITALS — BP 105/64 | HR 84 | Temp 97.0°F | Resp 16 | Ht 61.0 in | Wt 194.0 lb

## 2018-12-19 DIAGNOSIS — Z992 Dependence on renal dialysis: Secondary | ICD-10-CM

## 2018-12-19 DIAGNOSIS — N186 End stage renal disease: Secondary | ICD-10-CM

## 2019-01-13 NOTE — Progress Notes (Signed)
Patient Care Team: Lucianne Lei, MD as PCP - General (Family Medicine) Lorretta Harp, MD as PCP - Cardiology (Cardiology) Nicholas Lose, MD as PCP - Hematology/Oncology (Hematology and Oncology) Donato Heinz, MD (Nephrology) Center, [facility]  DIAGNOSIS:    ICD-10-CM   1. Malignant neoplasm of upper-outer quadrant of right breast in female, estrogen receptor negative ([place])  C50.411    Z17.1     SUMMARY OF ONCOLOGIC HISTORY: Oncology History  Malignant neoplasm of upper-outer quadrant of right breast in female, estrogen receptor negative ([place])  06/19/2017 Initial Diagnosis   Right breast skin thickening and heaviness: Screening mammogram detected 2 cm of microcalcifications in the right breast in the posterior third UOQ.?  Skin involvement, stereotactic biopsy ([id number]) revealed IDC grade 2-3 with high-grade DCIS, lymphovascular invasion present, ER 0%, PR 0%, HER-2 positive ratio 8.69, gene copy #11.3 T1CN0 stage Ia   07/02/2017 Cancer Staging   Staging form: Breast, AJCC 8th Edition - Clinical: Stage IA (cT1c, cN0, cM0, G3, ER-, PR-, HER2+) - Signed by Nicholas Lose, MD on 07/02/2017   07/17/2017 - 08/06/2018 Chemotherapy   Taxol Herceptin weekly x12 (07/17/17-09/25/17) followed by Herceptin maintenance x1 year   11/20/2017 Surgery   Right mastectomy ([id number]) under Dr. Coralie Keens: No residual invasive cancer.  0/1 lymph node negative ER: 0%, negative, PR: 0%, negative, Her2: Positive, ratio 8.69. Ki-67: 30%     CHIEF COMPLIANT: Follow-up of right breast cancer   INTERVAL HISTORY: Karen [name] is a 63 y.o. with above-mentioned history of right breast cancer treated withneoadjuvantchemotherapy, rightmastectomy, maintenance Herceptin, and who is currently on surveillance. Her port was removed by Dr. Ninfa Linden on 08/11/18. She presents to the clinic today for follow-up.  REVIEW OF SYSTEMS:   Constitutional: Denies fevers, chills or abnormal weight  loss Eyes: Denies blurriness of vision Ears, nose, mouth, throat, and face: Denies mucositis or sore throat Respiratory: Denies cough, dyspnea or wheezes Cardiovascular: Denies palpitation, chest discomfort Gastrointestinal: Denies nausea, heartburn or change in bowel habits Skin: Denies abnormal skin rashes Lymphatics: Denies new lymphadenopathy or easy bruising Neurological: Denies numbness, tingling or new weaknesses Behavioral/Psych: Mood is stable, no new changes  Extremities: No lower extremity edema Breast: denies any pain or lumps or nodules in either breasts All other systems were reviewed with the patient and are negative.  I have reviewed the past medical history, past surgical history, social history and family history with the patient and they are unchanged from previous note.  ALLERGIES:  has No Known Allergies.  MEDICATIONS:  Current Outpatient Medications  Medication Sig Dispense Refill  . acetaminophen (TYLENOL) 500 MG tablet Take 1,000 mg by mouth daily as needed for mild pain, moderate pain or headache.     Marland Kitchen amoxicillin (AMOXIL) 500 MG capsule Take 2,000 mg by mouth See admin instructions. Take 4 capsules (2000 mg) by mouth one hour prior to dental procedures    . clobetasol cream (TEMOVATE) 0.73 % Apply 1 application topically daily as needed (for rash).     Marland Kitchen diltiazem (CARDIZEM) 30 MG tablet TAKE 1/2 TABLET EVERY 4 HOURS AS NEEDED FOR AFIB HEART RATE OVER 100 (Patient taking differently: Take 30 mg by mouth See admin instructions. Take 1 tablet (30 mg) by mouth after dialysis on Mondays, Wednesdays, & Fridays.) 45 tablet 1  . HYDROcodone-acetaminophen (NORCO) 5-325 MG tablet Take 1 tablet by mouth every 6 (six) hours as needed for moderate pain. 20 tablet 0  . lidocaine-prilocaine (EMLA) cream Apply 1 application topically See admin  instructions. Apply topically Monday, Wednesday and Friday before dialysis  6  . midodrine (PROAMATINE) 10 MG tablet Take 10 mg by mouth  See admin instructions. Take 1 tablet (10 mg) by mouth at beginning of dialysis & take 1 tablet (10 mg) by mouth nearing the end of dialysis on Mondays, Wednesdays, & Fridays    . multivitamin (RENA-VIT) TABS tablet Take 1 tablet by mouth daily in the afternoon.     . ondansetron (ZOFRAN) 8 MG tablet Take 8 mg by mouth every 8 (eight) hours as needed for nausea or vomiting.    . pantoprazole (PROTONIX) 40 MG tablet TAKE 1 TABLET (40 MG TOTAL) BY MOUTH DAILY. 90 tablet 0  . prochlorperazine (COMPAZINE) 10 MG tablet Take 10 mg by mouth every 6 (six) hours as needed (Nausea or vomiting).    . sevelamer carbonate (RENVELA) 800 MG tablet Take 800 mg by mouth See admin instructions. Take 1 tablet (600 mg) by mouth with each meal & 1 tablet (800 mg) by mouth with each snack.    . simvastatin (ZOCOR) 20 MG tablet TAKE 1 TABLET BY MOUTH EVERYDAY AT BEDTIME (Patient taking differently: Take 20 mg by mouth at bedtime. ) 90 tablet 2   No current facility-administered medications for this visit.    PHYSICAL EXAMINATION: ECOG PERFORMANCE STATUS: 1 - Symptomatic but completely ambulatory  Vitals:   01/14/19 0957  BP: 100/70  Pulse: 94  Resp: 20  Temp: 98.3 F (36.8 C)  SpO2: 100%   Filed Weights   01/14/19 0957  Weight: 197 lb 4.8 oz (89.5 kg)    GENERAL: alert, no distress and comfortable SKIN: skin color, texture, turgor are normal, no rashes or significant lesions EYES: normal, Conjunctiva are pink and non-injected, sclera clear OROPHARYNX: no exudate, no erythema and lips, buccal mucosa, and tongue normal  NECK: supple, thyroid normal size, non-tender, without nodularity LYMPH: no palpable lymphadenopathy in the cervical, axillary or inguinal LUNGS: clear to auscultation and percussion with normal breathing effort HEART: regular rate & rhythm and no murmurs and no lower extremity edema ABDOMEN: abdomen soft, non-tender and normal bowel sounds MUSCULOSKELETAL: no cyanosis of digits and no  clubbing  NEURO: alert & oriented x 3 with fluent speech, no focal motor/sensory deficits EXTREMITIES: No lower extremity edema  LABORATORY DATA:  I have reviewed the data as listed CMP Latest Ref Rng & Units 11/12/2018 09/17/2018 09/02/2018  Glucose 70 - 99 mg/dL 89 92 91  BUN 8 - 23 mg/dL 26(H) - 52(H)  Creatinine 0.44 - 1.00 mg/dL 7.10(H) - 8.50(H)  Sodium 135 - 145 mmol/L 138 138 139  Potassium 3.5 - 5.1 mmol/L 3.8 3.8 4.6  Chloride 98 - 111 mmol/L 95(L) - 99  CO2 22 - 32 mmol/L - - -  Calcium 8.9 - 10.3 mg/dL - - -  Total Protein 6.5 - 8.1 g/dL - - -  Total Bilirubin 0.3 - 1.2 mg/dL - - -  Alkaline Phos 38 - 126 U/L - - -  AST 15 - 41 U/L - - -  ALT 0 - 44 U/L - - -    Lab Results  Component Value Date   WBC 4.2 08/06/2018   HGB 11.9 (L) 11/12/2018   HCT 35.0 (L) 11/12/2018   MCV 101.3 (H) 08/06/2018   PLT 176 08/06/2018   NEUTROABS 2.6 08/06/2018    ASSESSMENT & PLAN:  Malignant neoplasm of upper-outer quadrant of right breast in female, estrogen receptor negative ([place]) 06/19/2017: Right breast skin thickening  and heaviness: Screening mammogram detected 2 cm of microcalcifications in the right breast in the posterior third UOQ.? 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 0/1 lymph node negative ER: 0%, negative, PR: 0%, negative, Her2: Positive, ratio 8.69. Ki-67: 30% Herceptin maintenance completed 08/06/2018 Hospitalization: 01/06/2018 - 01/08/2018: Pericarditis unrelated to treatment: ---------------------------------------------------------------------------------------------------------------------------------------------- Breast cancer surveillance: 1. left breastmammogram 06/20/2018: Benign breast density category C 2.  Breast  exam 01/14/2019: Benign  No role of antiestrogen therapy because she is ER/PR negative.  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 Anemia due to chronic kidney disease   Return to clinic in 1 year for follow-up      No orders of the defined types were placed in this encounter.  The patient has a good understanding of the overall plan. she agrees with it. she will call with any problems that may develop before the next visit here.  Nicholas Lose, MD 01/14/2019  Julious Oka Dorshimer, am acting as scribe for Dr. Nicholas Lose.  I have reviewed the above document for accuracy and completeness, and I agree with the above.

## 2019-01-14 ENCOUNTER — Inpatient Hospital Stay: Payer: Medicare Other | Attending: Hematology and Oncology | Admitting: Hematology and Oncology

## 2019-01-14 ENCOUNTER — Other Ambulatory Visit: Payer: Self-pay

## 2019-01-14 DIAGNOSIS — Z9011 Acquired absence of right breast and nipple: Secondary | ICD-10-CM | POA: Insufficient documentation

## 2019-01-14 DIAGNOSIS — Z853 Personal history of malignant neoplasm of breast: Secondary | ICD-10-CM | POA: Diagnosis not present

## 2019-01-14 DIAGNOSIS — N186 End stage renal disease: Secondary | ICD-10-CM | POA: Diagnosis not present

## 2019-01-14 DIAGNOSIS — Z992 Dependence on renal dialysis: Secondary | ICD-10-CM | POA: Insufficient documentation

## 2019-01-14 DIAGNOSIS — Z171 Estrogen receptor negative status [ER-]: Secondary | ICD-10-CM | POA: Diagnosis not present

## 2019-01-14 DIAGNOSIS — D631 Anemia in chronic kidney disease: Secondary | ICD-10-CM | POA: Diagnosis not present

## 2019-01-14 DIAGNOSIS — C50411 Malignant neoplasm of upper-outer quadrant of right female breast: Secondary | ICD-10-CM

## 2019-01-14 NOTE — Assessment & Plan Note (Signed)
06/19/2017: Right breast skin thickening and heaviness: Screening mammogram detected 2 cm of microcalcifications in the right breast in the posterior third UOQ.? 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 0/1 lymph node negative ER: 0%, negative, PR: 0%, negative, Her2: Positive, ratio 8.69. Ki-67: 30% Herceptin maintenance completed 08/06/2018 Hospitalization: 01/06/2018 - 01/08/2018: Pericarditis unrelated to treatment: ---------------------------------------------------------------------------------------------------------------------------------------------- Breast cancer surveillance: 1. left breastmammogram 06/20/2018: Benign breast density category C 2.  Breast exam 01/14/2019: Benign  No role of antiestrogen therapy because she is ER/PR negative.  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 Anemia due to chronic kidney disease: Hemoglobin is stable at 9.4 today.  Return to clinic in 1 year for follow-up

## 2019-01-15 ENCOUNTER — Ambulatory Visit: Payer: Medicare Other | Admitting: Hematology and Oncology

## 2019-01-18 ENCOUNTER — Other Ambulatory Visit (HOSPITAL_COMMUNITY): Payer: Self-pay | Admitting: Nurse Practitioner

## 2019-01-20 ENCOUNTER — Telehealth: Payer: Self-pay | Admitting: Hematology and Oncology

## 2019-01-20 NOTE — Telephone Encounter (Signed)
Scheduled per 12/29 los, patient has been called and notified.

## 2019-02-10 ENCOUNTER — Other Ambulatory Visit (HOSPITAL_COMMUNITY): Payer: Self-pay | Admitting: Nurse Practitioner

## 2019-02-13 ENCOUNTER — Other Ambulatory Visit: Payer: Self-pay | Admitting: Cardiovascular Disease

## 2019-02-20 ENCOUNTER — Ambulatory Visit: Payer: Medicare Other | Admitting: Cardiovascular Disease

## 2019-02-20 ENCOUNTER — Encounter: Payer: Self-pay | Admitting: Cardiovascular Disease

## 2019-02-20 ENCOUNTER — Other Ambulatory Visit: Payer: Self-pay

## 2019-02-20 ENCOUNTER — Ambulatory Visit (INDEPENDENT_AMBULATORY_CARE_PROVIDER_SITE_OTHER): Payer: Medicare Other | Admitting: Cardiovascular Disease

## 2019-02-20 VITALS — BP 100/70 | HR 98 | Ht 61.0 in | Wt 195.0 lb

## 2019-02-20 DIAGNOSIS — E785 Hyperlipidemia, unspecified: Secondary | ICD-10-CM | POA: Insufficient documentation

## 2019-02-20 DIAGNOSIS — E782 Mixed hyperlipidemia: Secondary | ICD-10-CM | POA: Diagnosis not present

## 2019-02-20 DIAGNOSIS — I480 Paroxysmal atrial fibrillation: Secondary | ICD-10-CM

## 2019-02-20 NOTE — Progress Notes (Signed)
02/20/2019 Oleh Genin   21-Jun-1955  [id number]  Primary Physician Lucianne Lei, MD Primary Cardiologist: Lorretta Harp MD Lupe Carney, [place]  HPI:  Karen [name] is a 64 y.o.  married African American female mother of 2, grandmother to 2 grandchildren who currently works as an Sales executive.  I last saw her in the office 01/25/2018.  Her cardiac risk factor profile is notable for hypertension but otherwise are negative. For the last 3 weeks prior to her last office visit she had noticed substernal chest pain radiating to both upper extremities occurring on a daily basis. She does have reflux but she says the symptoms are different. I ordered a Myoview stress test which was entirely normal. Since I saw her in the office a year ago her chest pain has resolved. She has gone on hemodialysis one year ago. 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   She was admitted to [facility] 01/07/2018 with atypical chest pain.  She ruled out for myocardial infarction.  Her d-dimer was mildly elevated and a CTA was negative for PE.  She was seen by Dr. 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  He did hear a rub.  Her diltiazem was discontinued as was her aspirin.  Since I saw her a year ago she has had issues with her fistula is which have been addressed by Dr. Donzetta Matters.  She specifically denies chest pain or shortness of breath.    No outpatient medications have been marked as taking for the 02/20/19 encounter (Office Visit) with Lorretta Harp, MD.     No Known Allergies  Social History   Socioeconomic History  . Marital status: Married    Spouse name: Not on file  . Number of children: 2  . Years of education: Not on file  . Highest education level: Not on file  Occupational History  .  Occupation: SHERIFF'S OFFICE    Employer: [employer]  Tobacco Use  . Smoking status: Never Smoker  . Smokeless tobacco: Never Used  Substance and Sexual Activity  . Alcohol use: No  . Drug use: No  . Sexual activity: Not on file    Comment: Hysterectomy  Other Topics Concern  . Not on file  Social History Narrative  . Not on file   Social Determinants of Health   Financial Resource Strain:   . Difficulty of Paying Living Expenses: Not on file  Food Insecurity:   . Worried About Charity fundraiser in the Last Year: Not on file  . Ran Out of Food in the Last Year: Not on file  Transportation Needs:   . Lack of Transportation (Medical): Not on file  . Lack of Transportation (Non-Medical): Not on file  Physical Activity:   . Days of Exercise per Week: Not on file  . Minutes of Exercise per Session: Not on file  Stress:   . Feeling of Stress : Not on file  Social Connections:   . Frequency of Communication with Friends and Family: Not on file  . Frequency of Social Gatherings with Friends and Family: Not on file  . Attends Religious Services: Not on file  . Active Member of Clubs or Organizations: Not on file  . Attends Archivist Meetings: Not on file  . Marital Status: Not on file  Intimate Partner  Violence:   . Fear of Current or Ex-Partner: Not on file  . Emotionally Abused: Not on file  . Physically Abused: Not on file  . Sexually Abused: Not on file     Review of Systems: General: negative for chills, fever, night sweats or weight changes.  Cardiovascular: negative for chest pain, dyspnea on exertion, edema, orthopnea, palpitations, paroxysmal nocturnal dyspnea or shortness of breath Dermatological: negative for rash Respiratory: negative for cough or wheezing Urologic: negative for hematuria Abdominal: negative for nausea, vomiting, diarrhea, bright red blood per rectum, melena, or hematemesis Neurologic: negative for visual changes, syncope, or  dizziness All other systems reviewed and are otherwise negative except as noted above.    Blood pressure 100/70, pulse 98, height 5\' 1"  (1.549 m), weight 195 lb (88.5 kg), SpO2 100 %.  General appearance: alert and no distress Neck: no adenopathy, no carotid bruit, no JVD, supple, symmetrical, trachea midline and thyroid not enlarged, symmetric, no tenderness/mass/nodules Lungs: clear to auscultation bilaterally Heart: regular rate and rhythm, S1, S2 normal, no murmur, click, rub or gallop Extremities: extremities normal, atraumatic, no cyanosis or edema Pulses: 2+ and symmetric Skin: Skin color, texture, turgor normal. No rashes or lesions Neurologic: Alert and oriented X 3, normal strength and tone. Normal symmetric reflexes. Normal coordination and gait  EKG sinus rhythm at 98 with poor R wave progression.  I personally reviewed this EKG.  ASSESSMENT AND PLAN:   Paroxysmal atrial fibrillation (HCC) History of paroxysmal atrial fibrillation in the past without recurrence maintaining sinus rhythm.  She is on diltiazem.  She is not on oral anticoagulation.  Hyperlipidemia History of hyperlipidemia on simvastatin with lipid profile performed 01/17/2018 revealing total cholesterol 185, LDL 115 and HDL 48, acceptable for primary prevention.      Lorretta Harp MD FACP,FACC,FAHA, [facility] 02/20/2019 3:50 PM

## 2019-02-20 NOTE — Assessment & Plan Note (Signed)
History of hyperlipidemia on simvastatin with lipid profile performed 01/17/2018 revealing total cholesterol 185, LDL 115 and HDL 48, acceptable for primary prevention.

## 2019-02-20 NOTE — Assessment & Plan Note (Signed)
History of paroxysmal atrial fibrillation in the past without recurrence maintaining sinus rhythm.  She is on diltiazem.  She is not on oral anticoagulation.

## 2019-02-20 NOTE — Patient Instructions (Signed)
Medication Instructions:  NO CHANGE *If you need a refill on your cardiac medications before your next appointment, please call your pharmacy*  Lab Work: If you have labs (blood work) drawn today and your tests are completely normal, you will receive your results only by: Marland Kitchen MyChart Message (if you have MyChart) OR . A paper copy in the mail If you have any lab test that is abnormal or we need to change your treatment, we will call you to review the results.  Follow-Up: At [facility], you and your health needs are our priority.  As part of our continuing mission to provide you with exceptional heart care, we have created designated Provider Care Teams.  These Care Teams include your primary Cardiologist (physician) and Advanced Practice Providers (APPs -  Physician Assistants and Nurse Practitioners) who all work together to provide you with the care you need, when you need it.  Your next appointment:   12 month(s)  The format for your next appointment:   Either In Person or Virtual  Provider:   You may see Quay Burow, MD or one of the following Advanced Practice Providers on your designated Care Team:    Kerin Ransom, PA-C  [place], [place]  Coletta Memos, [place]

## 2019-02-28 ENCOUNTER — Other Ambulatory Visit (HOSPITAL_COMMUNITY): Payer: Self-pay | Admitting: Nephrology

## 2019-02-28 DIAGNOSIS — N186 End stage renal disease: Secondary | ICD-10-CM

## 2019-02-28 DIAGNOSIS — Z992 Dependence on renal dialysis: Secondary | ICD-10-CM

## 2019-03-03 ENCOUNTER — Other Ambulatory Visit: Payer: Self-pay | Admitting: Student

## 2019-03-04 ENCOUNTER — Other Ambulatory Visit (HOSPITAL_COMMUNITY): Payer: Self-pay | Admitting: Nephrology

## 2019-03-04 ENCOUNTER — Other Ambulatory Visit: Payer: Self-pay

## 2019-03-04 ENCOUNTER — Ambulatory Visit (HOSPITAL_COMMUNITY)
Admission: RE | Admit: 2019-03-04 | Discharge: 2019-03-04 | Disposition: A | Discharge status: Home / Self Care | Payer: Medicare Other | Source: Ambulatory Visit | Attending: Nephrology | Admitting: Nephrology

## 2019-03-04 ENCOUNTER — Encounter (HOSPITAL_COMMUNITY): Payer: Self-pay

## 2019-03-04 DIAGNOSIS — Z853 Personal history of malignant neoplasm of breast: Secondary | ICD-10-CM | POA: Insufficient documentation

## 2019-03-04 DIAGNOSIS — Z992 Dependence on renal dialysis: Secondary | ICD-10-CM | POA: Insufficient documentation

## 2019-03-04 DIAGNOSIS — K219 Gastro-esophageal reflux disease without esophagitis: Secondary | ICD-10-CM | POA: Insufficient documentation

## 2019-03-04 DIAGNOSIS — I509 Heart failure, unspecified: Secondary | ICD-10-CM | POA: Diagnosis not present

## 2019-03-04 DIAGNOSIS — N186 End stage renal disease: Secondary | ICD-10-CM | POA: Diagnosis not present

## 2019-03-04 DIAGNOSIS — Y841 Kidney dialysis as the cause of abnormal reaction of the patient, or of later complication, without mention of misadventure at the time of the procedure: Secondary | ICD-10-CM | POA: Diagnosis not present

## 2019-03-04 DIAGNOSIS — Z79899 Other long term (current) drug therapy: Secondary | ICD-10-CM | POA: Diagnosis not present

## 2019-03-04 DIAGNOSIS — T82858A Stenosis of vascular prosthetic devices, implants and grafts, initial encounter: Secondary | ICD-10-CM | POA: Diagnosis not present

## 2019-03-04 DIAGNOSIS — I132 Hypertensive heart and chronic kidney disease with heart failure and with stage 5 chronic kidney disease, or end stage renal disease: Secondary | ICD-10-CM | POA: Insufficient documentation

## 2019-03-04 HISTORY — PX: IR AV DIALY SHUNT INTRO NEEDLE/INTRACATH INITIAL W/PTA/IMG RIGHT: IMG6116

## 2019-03-04 MED ORDER — SODIUM CHLORIDE 0.9 % IV SOLN: INTRAVENOUS | Status: DC

## 2019-03-04 MED ORDER — IOHEXOL 300 MG/ML  SOLN
100.0000 mL | Freq: Once | INTRAMUSCULAR | Status: AC | PRN
  Administered 2019-03-04: 11:00:00 35 mL via INTRAVENOUS

## 2019-03-04 MED ORDER — FENTANYL CITRATE (PF) 100 MCG/2ML IJ SOLN
INTRAMUSCULAR | Status: AC | PRN
  Administered 2019-03-04: 25 ug via INTRAVENOUS

## 2019-03-04 MED ORDER — MIDAZOLAM HCL 2 MG/2ML IJ SOLN
INTRAMUSCULAR | Status: AC
  Filled 2019-03-04: qty 2

## 2019-03-04 MED ORDER — FENTANYL CITRATE (PF) 100 MCG/2ML IJ SOLN
INTRAMUSCULAR | Status: AC
  Filled 2019-03-04: qty 2

## 2019-03-04 MED ORDER — SODIUM CHLORIDE 0.9 % IV SOLN
INTRAVENOUS | Status: AC | PRN
  Administered 2019-03-04: 10 mL/h via INTRAVENOUS

## 2019-03-04 MED ORDER — MIDAZOLAM HCL 2 MG/2ML IJ SOLN
INTRAMUSCULAR | Status: AC | PRN
  Administered 2019-03-04: 1 mg via INTRAVENOUS

## 2019-03-04 MED ORDER — LIDOCAINE HCL 1 % IJ SOLN
INTRAMUSCULAR | Status: AC
  Filled 2019-03-04: qty 20

## 2019-03-04 NOTE — Discharge Instructions (Signed)
AV Fistula Placement, Care After This sheet gives you information about how to care for yourself after your procedure. Your health care provider may also give you more specific instructions. If you have problems or questions, contact your health care provider. What can I expect after the procedure? After the procedure, it is common to:  Feel sore.  Feel a vibration (thrill) over the fistula. Follow these instructions at home: Incision care      Follow instructions from your health care provider about how to take care of your incision. Make sure you: ? Wash your hands with soap and water before and after you change your bandage (dressing). If soap and water are not available, use hand sanitizer. ? Change your dressing as told by your health care provider. ? Leave stitches (sutures), skin glue, or adhesive strips in place. These skin closures may need to stay in place for 2 weeks or longer. If adhesive strip edges start to loosen and curl up, you may trim the loose edges. Do not remove adhesive strips completely unless your health care provider tells you to do that. Fistula care  Check your fistula site every day to make sure the thrill feels the same.  Check your fistula site every day for signs of infection. Check for: ? Redness, swelling, or pain. ? Fluid or blood. ? Warmth. ? Pus or a bad smell.  Raise (elevate) the affected area above the level of your heart while you are sitting or lying down.  Do not lift anything that is heavier than 10 lb (4.5 kg), or the limit that you are told, until your health care provider says that it is safe.  Do not lie down on your fistula arm.  Do not let anyone draw blood or take a blood pressure reading on your fistula arm. This is important.  Do not wear tight jewelry or clothing over your fistula arm. Bathing  Do not take baths, swim, or use a hot tub until your health care provider approves. Ask your health care provider if you may take  showers. You may only be allowed to take sponge baths.  Keep the area around your incision clean and dry. Medicines  Take over-the-counter and prescription medicines only as told by your health care provider.  Ask your health care provider if any medicine prescribed to you can cause constipation. You may need to take steps to prevent or treat constipation, such as: ? Drink enough fluid to keep your urine pale yellow. ? Take over-the-counter or prescription medicines. ? Eat foods that are high in fiber, such as beans, whole grains, and fresh fruits and vegetables. ? Limit foods that are high in fat and processed sugars, such as fried or sweet foods. General instructions  Rest at home for a day or two.  Return to your normal activities as told by your health care provider. Ask your health care provider what activities are safe for you.  Keep all follow-up visits as told by your health care provider. This is important. Contact a health care provider if:  You have redness, swelling, or pain around your fistula site.  Your fistula site feels warm to the touch.  You have pus or a bad smell coming from your fistula site.  You have a fever.  You have numbness or coldness at your fistula site.  You feel a decrease or a change in the thrill. Get help right away if you:  Are bleeding from your fistula site.  Have chest   pain.  Have trouble breathing. Summary  Follow instructions from your health care provider about how to take care of your incision.  Do not let anyone draw blood or take a blood pressure reading on your fistula arm. This is important.  Return to your normal activities as told by your health care provider. Ask your health care provider what activities are safe for you.  Contact a health care provider if you have a change in the thrill or have any signs of infection at your fistula site.  Keep all follow-up visits as told by your health care provider. This is  important. This information is not intended to replace advice given to you by your health care provider. Make sure you discuss any questions you have with your health care provider. Document Revised: 06/21/2018 Document Reviewed: 07/09/2017 Elsevier Patient Education  2020 Elsevier Inc.  

## 2019-03-04 NOTE — Procedures (Signed)
Esrd, decreased access flow rates  S/p RUE FISTULOGRAM WITH 7MM VENOUS PTA  No comp Stable ebl min Full report in pacs

## 2019-03-04 NOTE — H&P (Signed)
Chief Complaint: Patient was seen in consultation today for right arm dialysis fistula evaluation and possible intervention at the request of [place]  Referring Physician(s): Coladonato,Joseph  Supervising Physician: Daryll Brod  Patient Status: [facility] - Out-pt  History of Present Illness: Karen [name] is a 64 y.o. female   ESRD Rt arm access placed 10/2018 Has been using it for dialysis since Jan 2021 Difficult cannulation and few infiltrations Pt has L IJ tunneled catheter in place  Dr Marval Regal requesting evaluation of Rt arm access and intervention if needed  Last use yesterday Completed dialysis  Past Medical History:  Diagnosis Date  . Anemia   . Arthritis    knees  . Breast cancer ([place]) 2019   Right Breast Cancer  . Cancer ([place])   . Chest pain Jan 2016   low risk Myoview   . CHF (congestive heart failure) ([place])   . Complication of anesthesia 2005   difficulty remembering for a while and waking up  . Constipation   . Dysrhythmia    h/o A-Fib  . ESRD on dialysis [facility]) April 2016   MWF [place]  . GERD (gastroesophageal reflux disease)   . Heart murmur Nov 2015   Aortic scleosis- no stenosis  . Hemodialysis patient ([place])   . Hypertension   . Shortness of breath dyspnea    with exertion    Past Surgical History:  Procedure Laterality Date  . ABDOMINAL HYSTERECTOMY  2005  . AV FISTULA PLACEMENT Left 12/09/2013   Procedure: INSERTION OF ARTERIOVENOUS (AV) GORE-TEX GRAFT ARM;  Surgeon: Elam Dutch, MD;  Location: [facility] OR;  Service: Vascular;  Laterality: Left;  . AV FISTULA PLACEMENT Right 01/01/2018   Procedure: INSERTION OF ARTERIOVENOUS (AV) GORE-TEX GRAFT ARM RIGHT ARM;  Surgeon: Elam Dutch, MD;  Location: [place];  Service: Vascular;  Laterality: Right;  . AV FISTULA PLACEMENT Right 09/17/2018   Procedure: 1ST STAGE BASILIC ARTERIOVENOUS (AV) FISTULA CREATION RIGHT ARM;  Surgeon: Waynetta Sandy, MD;   Location: [place];  Service: Vascular;  Laterality: Right;  . BASCILIC VEIN TRANSPOSITION Right 11/12/2018   Procedure: SECOND STAGE BASILIC VEIN TRANSPOSITION RIGHT ARM;  Surgeon: Waynetta Sandy, MD;  Location: [place];  Service: Vascular;  Laterality: Right;  . BREAST BIOPSY  1990's  . BUNIONECTOMY Bilateral   . CHOLECYSTECTOMY  2005  . COLONOSCOPY    . IR GENERIC HISTORICAL  01/11/2016   IR US GUIDE VASC ACCESS RIGHT 01/11/2016 Corrie Mckusick, DO [facility]-INTERV RAD  . IR GENERIC HISTORICAL  01/11/2016   IR RADIOLOGY PERIPHERAL GUIDED IV START 01/11/2016 Corrie Mckusick, DO [facility]-INTER[facility]  . IR IMAGING GUIDED PORT INSERTION  07/05/2017  . 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 KNEE ARTHROSCOPY Bilateral   . MASTECTOMY W/ SENTINEL NODE BIOPSY Right 11/20/2017  . MASTECTOMY W/ SENTINEL NODE BIOPSY Right 11/20/2017   Procedure: RIGHT MASTECTOMY WITH SENTINEL LYMPH NODE BIOPSY;  Surgeon: Coralie Keens, MD;  Location: [place];  Service: General;  Laterality: Right;  . PERIPHERAL VASCULAR CATHETERIZATION N/A 08/31/2015   Procedure: A/V Shuntogram;  Surgeon: Serafina Mitchell, MD;  Location: [place] CV LAB;  Service: Cardiovascular;  Laterality: N/A;  . PERIPHERAL VASCULAR CATHETERIZATION Left 08/31/2015   Procedure: Peripheral Vascular Balloon Angioplasty;  Surgeon: Serafina Mitchell, MD;  Location: [place] CV LAB;  Service: Cardiovascular;  Laterality: Left;  arm fistula  . PORT-A-CATH REMOVAL N/A 08/12/2018   Procedure: PORT-A-CATH REMOVAL;  Surgeon: Coralie Keens, MD;  Location: [place];  Service: General;  Laterality: N/A;  . ROTATOR CUFF REPAIR Right 1997  . UPPER EXTREMITY VENOGRAPHY Right 12/24/2017   Procedure: UPPER EXTREMITY VENOGRAPHY CENTRAL VENOGRAM;  Surgeon: Waynetta Sandy, MD;  Location: [place] CV LAB;  Service: Cardiovascular;  Laterality: Right;  . UPPER EXTREMITY VENOGRAPHY Bilateral 09/02/2018   Procedure: UPPER EXTREMITY VENOGRAPHY;  Surgeon: Waynetta Sandy, MD;  Location: [place] CV LAB;  Service: Cardiovascular;  Laterality: Bilateral;    Allergies: Patient has no known allergies.  Medications: Prior to Admission medications   Medication Sig Start Date End Date Taking? Authorizing Provider  acetaminophen (TYLENOL) 500 MG tablet Take 1,000 mg by mouth daily as needed for mild pain, moderate pain or headache.    Yes [provider]  diltiazem (CARDIZEM) 30 MG tablet PLEASE SEE ATTACHED FOR DETAILED DIRECTIONS 02/12/19  Yes Cleaver, Jossie Ng, NP  lidocaine-prilocaine (EMLA) cream Apply 1 application topically See admin instructions. Apply topically Monday, Wednesday and Friday before dialysis 05/26/15  Yes [provider]  midodrine (PROAMATINE) 10 MG tablet Take 10 mg by mouth See admin instructions. Take 1 tablet (10 mg) by mouth at beginning of dialysis & take 1 tablet (10 mg) by mouth nearing the end of dialysis on Mondays, Wednesdays, & Fridays 10/16/18  Yes [provider]  multivitamin (RENA-VIT) TABS tablet Take 1 tablet by mouth daily in the afternoon.    Yes [provider]  pantoprazole (PROTONIX) 40 MG tablet TAKE 1 TABLET (40 MG TOTAL) BY MOUTH DAILY. 02/13/19  Yes Lorretta Harp, MD  sevelamer carbonate (RENVELA) 800 MG tablet Take 800 mg by mouth See admin instructions. Take 1 tablet (600 mg) by mouth with each meal & 1 tablet (800 mg) by mouth with each snack.   Yes [provider]  simvastatin (ZOCOR) 20 MG tablet TAKE 1 TABLET BY MOUTH EVERYDAY AT BEDTIME Patient taking differently: Take 20 mg by mouth at bedtime.  07/15/18  Yes Lorretta Harp, MD  amoxicillin (AMOXIL) 500 MG capsule Take 2,000 mg by mouth See admin instructions. Take 4 capsules (2000 mg) by mouth one hour prior to dental procedures    [provider]  clobetasol cream (TEMOVATE) 6.33 % Apply 1 application topically daily as needed (for rash).     [provider]    HYDROcodone-acetaminophen (NORCO) 5-325 MG tablet Take 1 tablet by mouth every 6 (six) hours as needed for moderate pain. 11/12/18   Dagoberto Ligas, PA-C  ondansetron (ZOFRAN) 8 MG tablet Take 8 mg by mouth every 8 (eight) hours as needed for nausea or vomiting.    [provider]  prochlorperazine (COMPAZINE) 10 MG tablet Take 10 mg by mouth every 6 (six) hours as needed (Nausea or vomiting). 08/21/18 09/10/20  Nicholas Lose, MD     Family History  Problem Relation Age of Onset  . Kidney disease Mother   . Heart attack Father   . Kidney disease Father   . Diabetes Sister   . Hyperlipidemia Sister   . Hypertension Sister   . Kidney disease Sister        x2    Social History   Socioeconomic History  . Marital status: Married    Spouse name: Not on file  . Number of children: 2  . Years of education: Not on file  . Highest education level: Not on file  Occupational History  . Occupation: SHERIFF'S OFFICE    Employer: [employer]  Tobacco Use  .  Smoking status: Never Smoker  . Smokeless tobacco: Never Used  Substance and Sexual Activity  . Alcohol use: No  . Drug use: No  . Sexual activity: Not on file    Comment: Hysterectomy  Other Topics Concern  . Not on file  Social History Narrative  . Not on file   Social Determinants of Health   Financial Resource Strain:   . Difficulty of Paying Living Expenses: Not on file  Food Insecurity:   . Worried About Charity fundraiser in the Last Year: Not on file  . Ran Out of Food in the Last Year: Not on file  Transportation Needs:   . Lack of Transportation (Medical): Not on file  . Lack of Transportation (Non-Medical): Not on file  Physical Activity:   . Days of Exercise per Week: Not on file  . Minutes of Exercise per Session: Not on file  Stress:   . Feeling of Stress : Not on file  Social Connections:   . Frequency of Communication with Friends and Family: Not on file  . Frequency of Social Gatherings  with Friends and Family: Not on file  . Attends Religious Services: Not on file  . Active Member of Clubs or Organizations: Not on file  . Attends Archivist Meetings: Not on file  . Marital Status: Not on file    Review of Systems: A 12 point ROS discussed and pertinent positives are indicated in the HPI above.  All other systems are negative.  Review of Systems  Constitutional: Negative for activity change, fatigue and fever.  Respiratory: Negative for cough and shortness of breath.   Cardiovascular: Negative for chest pain.  Gastrointestinal: Negative for abdominal pain.  Psychiatric/Behavioral: Negative for behavioral problems and confusion.    Vital Signs: BP 120/67   Pulse 81   Temp (!) 97.3 F (36.3 C) (Skin)   Resp 16   Ht 5\' 1"  (1.549 m)   Wt 195 lb (88.5 kg)   SpO2 100%   BMI 36.84 kg/m   Physical Exam Cardiovascular:     Rate and Rhythm: Normal rate and regular rhythm.     Heart sounds: Normal heart sounds.  Pulmonary:     Effort: Pulmonary effort is normal.     Breath sounds: Normal breath sounds.  Abdominal:     Palpations: Abdomen is soft.  Musculoskeletal:        General: Normal range of motion.     Comments: Right arm dialysis fistula Good pulse + thrill Tender to touch  Skin:    General: Skin is warm and dry.  Neurological:     Mental Status: She is alert and oriented to person, place, and time.  Psychiatric:        Behavior: Behavior normal.        Thought Content: Thought content normal.        Judgment: Judgment normal.     Imaging: No results found.  Labs:  CBC: Recent Labs    06/04/18 0815 06/04/18 0815 06/25/18 0809 06/25/18 0809 07/16/18 0825 07/16/18 0825 08/06/18 0751 08/06/18 0751 08/12/18 0645 09/02/18 0601 09/17/18 0618 11/12/18 0624  WBC 4.4  --  4.4  --  4.3  --  4.2  --   --   --   --   --   HGB 9.3*   < > 9.5*   < > 9.9*   < > 9.6*  --  10.5* 10.2* 11.9* 11.9*  HCT 28.8*   < >  30.5*   < > 30.5*   <  > 30.4*   < > 31.0* 30.0* 35.0* 35.0*  PLT 203  --  202  --  180  --  176  --   --   --   --   --    < > = values in this interval not displayed.    COAGS: No results for input(s): INR, APTT in the last 8760 hours.  BMP: Recent Labs    06/04/18 0815 06/04/18 0815 06/25/18 0809 06/25/18 0809 07/16/18 0825 07/16/18 0825 08/06/18 0751 08/06/18 0751 08/12/18 0645 09/02/18 0601 09/17/18 0618 11/12/18 0624  NA 141   < > 142   < > 142   < > 142   < > 138 139 138 138  K 4.0   < > 4.8   < > 4.3   < > 4.5   < > 4.3 4.6 3.8 3.8  CL 99   < > 100   < > 99  --  100  --   --  99  --  95*  CO2 29  --  26  --  29  --  28  --   --   --   --   --   GLUCOSE 91   < > 111*   < > 94   < > 91   < > 87 91 92 89  BUN 36*   < > 33*   < > 28*  --  31*  --   --  52*  --  26*  CALCIUM 8.7*  --  8.3*  --  8.3*  --  8.5*  --   --   --   --   --   CREATININE 7.13*   < > 7.18*   < > 7.08*  --  7.25*  --   --  8.50*  --  7.10*  GFRNONAA 6*  --  6*  --  6*  --  5*  --   --   --   --   --   GFRAA 6*  --  6*  --  7*  --  6*  --   --   --   --   --    < > = values in this interval not displayed.    LIVER FUNCTION TESTS: Recent Labs    06/04/18 0815 06/25/18 0809 07/16/18 0825 08/06/18 0751  BILITOT 0.4 0.4 0.4 0.4  AST 13* 15 15 14*  ALT 10 9 11 13   ALKPHOS 214* 184* 189* 207*  PROT 7.5 7.6 7.6 7.3  ALBUMIN 3.5 3.5 3.6 3.4*    TUMOR MARKERS: No results for input(s): AFPTM, CEA, CA199, CHROMGRNA in the last 8760 hours.  Assessment and Plan:  ESRD Rt arm fistula placed 10/2018 In use since 01/2019 Difficult cannulation and infiltration Dr Arty Baumgartner requesting fistulogram before possible removal of catheter Pt is aware of procedure benefits and risks including but not limited to Infection; bleeding; damage to surrounding structures Agreeable to proceed  Thank you for this interesting consult.  I greatly enjoyed meeting Khali Perella and look forward to participating in their care.  A copy of  this report was sent to the requesting provider on this date.  Electronically Signed: Lavonia Drafts, PA-C 03/04/2019, 9:41 AM   I spent a total of  30 Minutes   in face to face in clinical consultation, greater than 50% of which was counseling/coordinating care for right  arm dialysis fistula eval and intervention

## 2019-03-13 ENCOUNTER — Other Ambulatory Visit (HOSPITAL_COMMUNITY): Payer: Self-pay | Admitting: Nephrology

## 2019-03-13 DIAGNOSIS — N186 End stage renal disease: Secondary | ICD-10-CM

## 2019-03-13 DIAGNOSIS — Z992 Dependence on renal dialysis: Secondary | ICD-10-CM

## 2019-03-18 ENCOUNTER — Inpatient Hospital Stay (HOSPITAL_COMMUNITY): Admission: RE | Admit: 2019-03-18 | Payer: Medicare Other | Source: Ambulatory Visit

## 2019-03-20 ENCOUNTER — Encounter (HOSPITAL_COMMUNITY): Payer: Self-pay

## 2019-03-20 ENCOUNTER — Ambulatory Visit (HOSPITAL_COMMUNITY): Admission: RE | Admit: 2019-03-20 | Payer: Medicare Other | Source: Ambulatory Visit

## 2019-04-11 ENCOUNTER — Telehealth (HOSPITAL_COMMUNITY): Payer: Self-pay

## 2019-04-11 NOTE — Telephone Encounter (Signed)

## 2019-04-14 ENCOUNTER — Telehealth: Payer: Self-pay

## 2019-04-14 ENCOUNTER — Other Ambulatory Visit: Payer: Self-pay | Admitting: *Deleted

## 2019-04-14 DIAGNOSIS — N186 End stage renal disease: Secondary | ICD-10-CM

## 2019-04-14 NOTE — Telephone Encounter (Signed)
Spoke to Karen [name] at [facility] who wanted me to pass along to PA for pt's appt tomorrow that her access flow is lower and she is bleeding more than normal and may need a fistulogram. They would like her catheter out by 4/6 if everything looks good.

## 2019-04-15 ENCOUNTER — Other Ambulatory Visit: Payer: Self-pay

## 2019-04-15 ENCOUNTER — Emergency Department (HOSPITAL_COMMUNITY)
Admission: EM | Admit: 2019-04-15 | Discharge: 2019-04-15 | Discharge status: Against Medical Advice | Payer: Medicare Other | Attending: Emergency Medicine | Admitting: Emergency Medicine

## 2019-04-15 ENCOUNTER — Ambulatory Visit (INDEPENDENT_AMBULATORY_CARE_PROVIDER_SITE_OTHER)
Admission: RE | Admit: 2019-04-15 | Discharge: 2019-04-15 | Disposition: A | Discharge status: Home / Self Care | Payer: Medicare Other | Source: Ambulatory Visit | Attending: Vascular Surgery | Admitting: Vascular Surgery

## 2019-04-15 ENCOUNTER — Ambulatory Visit (INDEPENDENT_AMBULATORY_CARE_PROVIDER_SITE_OTHER): Payer: Medicare Other | Admitting: Physician Assistant

## 2019-04-15 ENCOUNTER — Encounter (HOSPITAL_COMMUNITY): Payer: Self-pay | Admitting: Emergency Medicine

## 2019-04-15 ENCOUNTER — Emergency Department (HOSPITAL_COMMUNITY): Payer: Medicare Other

## 2019-04-15 VITALS — BP 92/62 | HR 100 | Temp 97.9°F | Ht 61.0 in | Wt 197.3 lb

## 2019-04-15 DIAGNOSIS — R079 Chest pain, unspecified: Secondary | ICD-10-CM | POA: Diagnosis present

## 2019-04-15 DIAGNOSIS — T82858D Stenosis of vascular prosthetic devices, implants and grafts, subsequent encounter: Secondary | ICD-10-CM | POA: Diagnosis not present

## 2019-04-15 DIAGNOSIS — N186 End stage renal disease: Secondary | ICD-10-CM | POA: Insufficient documentation

## 2019-04-15 DIAGNOSIS — Z5321 Procedure and treatment not carried out due to patient leaving prior to being seen by health care provider: Secondary | ICD-10-CM | POA: Diagnosis not present

## 2019-04-15 DIAGNOSIS — Z992 Dependence on renal dialysis: Secondary | ICD-10-CM

## 2019-04-15 LAB — CBC
HCT: 37 % (ref 36.0–46.0)
Hemoglobin: 11.4 g/dL — ABNORMAL LOW (ref 12.0–15.0)
MCH: 31 pg (ref 26.0–34.0)
MCHC: 30.8 g/dL (ref 30.0–36.0)
MCV: 100.5 fL — ABNORMAL HIGH (ref 80.0–100.0)
Platelets: 166 10*3/uL (ref 150–400)
RBC: 3.68 MIL/uL — ABNORMAL LOW (ref 3.87–5.11)
RDW: 14.6 % (ref 11.5–15.5)
WBC: 4.5 10*3/uL (ref 4.0–10.5)
nRBC: 0 % (ref 0.0–0.2)

## 2019-04-15 LAB — BASIC METABOLIC PANEL
Anion gap: 15 (ref 5–15)
BUN: 38 mg/dL — ABNORMAL HIGH (ref 8–23)
CO2: 29 mmol/L (ref 22–32)
Calcium: 9.1 mg/dL (ref 8.9–10.3)
Chloride: 93 mmol/L — ABNORMAL LOW (ref 98–111)
Creatinine, Ser: 8.36 mg/dL — ABNORMAL HIGH (ref 0.44–1.00)
GFR calc Af Amer: 5 mL/min — ABNORMAL LOW (ref >60)
GFR calc non Af Amer: 5 mL/min — ABNORMAL LOW (ref >60)
Glucose, Bld: 145 mg/dL — ABNORMAL HIGH (ref 70–99)
Potassium: 4.1 mmol/L (ref 3.5–5.1)
Sodium: 137 mmol/L (ref 135–145)

## 2019-04-15 LAB — TROPONIN I (HIGH SENSITIVITY): Troponin I (High Sensitivity): 10 ng/L (ref <18)

## 2019-04-15 NOTE — H&P (View-Only) (Signed)
POST OPERATIVE OFFICE NOTE    CC:  F/u for surgery  HPI:  This is a 64 y.o. 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 Donzetta Matters (her 1st stage was done on 09/17/2018 also by Dr. Donzetta Matters).  She had tenderness and edema around the tunneled area post op.    She previously had a venogram on 09/02/2018 by Dr. Donzetta Matters and was found to have a right central venous system that was patent and fills right atrium briskly.  Left side appears occluded at the site of her [facility].   She dialyzes M/W/F at the [facility] location.  She most recently had a fistulogram by IR in February 2021 where she had patent RUE AVF with the outflow venous stenoses treated with 87mm overlapping angioplasty post procedure.  She presents today with concerns regarding failed cannulation attempts and blood from flows dropping from 1500 cc/h to 1035 cc/h.  He denies hand pain, fever or chills  The pt does not have evidence of steal sx. The pt is on dialysis at the [place] location  No Known Allergies  Current Outpatient Medications  Medication Sig Dispense Refill  . acetaminophen (TYLENOL) 500 MG tablet Take 1,000 mg by mouth daily as needed for mild pain, moderate pain or headache.     Marland Kitchen amoxicillin (AMOXIL) 500 MG capsule Take 2,000 mg by mouth See admin instructions. Take 4 capsules (2000 mg) by mouth one hour prior to dental procedures    . clobetasol cream (TEMOVATE) 9.51 % Apply 1 application topically daily as needed (for rash).     Marland Kitchen diltiazem (CARDIZEM) 30 MG tablet PLEASE SEE ATTACHED FOR DETAILED DIRECTIONS 45 tablet 3  . HYDROcodone-acetaminophen (NORCO) 5-325 MG tablet Take 1 tablet by mouth every 6 (six) hours as needed for moderate pain. 20 tablet 0  . lidocaine-prilocaine (EMLA) cream Apply 1 application topically See admin instructions. Apply topically Monday, Wednesday and Friday before dialysis  6  . midodrine (PROAMATINE) 10 MG tablet Take 10 mg by mouth See admin instructions. Take 1 tablet (10  mg) by mouth at beginning of dialysis & take 1 tablet (10 mg) by mouth nearing the end of dialysis on Mondays, Wednesdays, & Fridays    . multivitamin (RENA-VIT) TABS tablet Take 1 tablet by mouth daily in the afternoon.     . ondansetron (ZOFRAN) 8 MG tablet Take 8 mg by mouth every 8 (eight) hours as needed for nausea or vomiting.    . pantoprazole (PROTONIX) 40 MG tablet TAKE 1 TABLET (40 MG TOTAL) BY MOUTH DAILY. 90 tablet 1  . prochlorperazine (COMPAZINE) 10 MG tablet Take 10 mg by mouth every 6 (six) hours as needed (Nausea or vomiting).    . sevelamer carbonate (RENVELA) 800 MG tablet Take 800 mg by mouth See admin instructions. Take 1 tablet (600 mg) by mouth with each meal & 1 tablet (800 mg) by mouth with each snack.    . simvastatin (ZOCOR) 20 MG tablet TAKE 1 TABLET BY MOUTH EVERYDAY AT BEDTIME (Patient taking differently: Take 20 mg by mouth at bedtime. ) 90 tablet 2   No current facility-administered medications for this visit.     ROS:  See HPI  Physical Exam:  Vitals:   04/15/19 1514  Weight: 197 lb 4.8 oz (89.5 kg)  Height: 5\' 1"  (1.549 m)    Incision: Left upper arm incisions are all well-healed Extremities:  There is a palpable 2+ radial pulse.  Motor and sensory are in  tact.  There is a thrill/bruit present.  The fistula is also noted to be pulsatile  Dialysis Duplex on 3/30: Diameter:  0.59-0.87 cm Depth:  0.2 cm proximally .08 cm distally   Assessment/Plan:  This is a 64 y.o. female who is s/p: Right upper arm basilic vein transposition.  I discussed the case with Dr. Donnetta Hutching and we reviewed the fistulogram done in February with the patient.  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  We recommend repeating the fistulogram and likely repeating angioplasty of this area.  Her questions are answered and she is in agreement with this plan.  We will do this at her next most convenient nondialysis day.  Barbie Banner,  PA-C -[facility] [phone]  Clinic MD:  [place]

## 2019-04-15 NOTE — ED Notes (Signed)
Sort tech stated that the pt is gone

## 2019-04-15 NOTE — Progress Notes (Signed)
POST OPERATIVE OFFICE NOTE    CC:  F/u for surgery  HPI:  This is a 64 y.o. 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 Donzetta Matters (her 1st stage was done on 09/17/2018 also by Dr. Donzetta Matters).  She had tenderness and edema around the tunneled area post op.    She previously had a venogram on 09/02/2018 by Dr. Donzetta Matters and was found to have a right central venous system that was patent and fills right atrium briskly.  Left side appears occluded at the site of her [facility].   She dialyzes M/W/F at the [facility] location.  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  She presents today with concerns regarding failed cannulation attempts and blood from flows dropping from 1500 cc/h to 1035 cc/h.  He denies hand pain, fever or chills  The pt does not have evidence of steal sx. The pt is on dialysis at the [place] location  No Known Allergies  Current Outpatient Medications  Medication Sig Dispense Refill  . acetaminophen (TYLENOL) 500 MG tablet Take 1,000 mg by mouth daily as needed for mild pain, moderate pain or headache.     Marland Kitchen amoxicillin (AMOXIL) 500 MG capsule Take 2,000 mg by mouth See admin instructions. Take 4 capsules (2000 mg) by mouth one hour prior to dental procedures    . clobetasol cream (TEMOVATE) 2.48 % Apply 1 application topically daily as needed (for rash).     Marland Kitchen diltiazem (CARDIZEM) 30 MG tablet PLEASE SEE ATTACHED FOR DETAILED DIRECTIONS 45 tablet 3  . HYDROcodone-acetaminophen (NORCO) 5-325 MG tablet Take 1 tablet by mouth every 6 (six) hours as needed for moderate pain. 20 tablet 0  . lidocaine-prilocaine (EMLA) cream Apply 1 application topically See admin instructions. Apply topically Monday, Wednesday and Friday before dialysis  6  . midodrine (PROAMATINE) 10 MG tablet Take 10 mg by mouth See admin instructions. Take 1 tablet (10  mg) by mouth at beginning of dialysis & take 1 tablet (10 mg) by mouth nearing the end of dialysis on Mondays, Wednesdays, & Fridays    . multivitamin (RENA-VIT) TABS tablet Take 1 tablet by mouth daily in the afternoon.     . ondansetron (ZOFRAN) 8 MG tablet Take 8 mg by mouth every 8 (eight) hours as needed for nausea or vomiting.    . pantoprazole (PROTONIX) 40 MG tablet TAKE 1 TABLET (40 MG TOTAL) BY MOUTH DAILY. 90 tablet 1  . prochlorperazine (COMPAZINE) 10 MG tablet Take 10 mg by mouth every 6 (six) hours as needed (Nausea or vomiting).    . sevelamer carbonate (RENVELA) 800 MG tablet Take 800 mg by mouth See admin instructions. Take 1 tablet (600 mg) by mouth with each meal & 1 tablet (800 mg) by mouth with each snack.    . simvastatin (ZOCOR) 20 MG tablet TAKE 1 TABLET BY MOUTH EVERYDAY AT BEDTIME (Patient taking differently: Take 20 mg by mouth at bedtime. ) 90 tablet 2   No current facility-administered medications for this visit.     ROS:  See HPI  Physical Exam:  Vitals:   04/15/19 1514  Weight: 197 lb 4.8 oz (89.5 kg)  Height: 5\' 1"  (1.549 m)    Incision: Left upper arm incisions are all well-healed Extremities:  There is a palpable 2+ radial pulse.  Motor and sensory are in  tact.  There is a thrill/bruit present.  The fistula is also noted to be pulsatile  Dialysis Duplex on 3/30: Diameter:  0.59-0.87 cm Depth:  0.2 cm proximally .08 cm distally   Assessment/Plan:  This is a 64 y.o. female who is s/p: Right upper arm basilic vein transposition.  I discussed the case with Dr. Donnetta Hutching and we reviewed the fistulogram done in February with the patient.  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  We recommend repeating the fistulogram and likely repeating angioplasty of this area.  Her questions are answered and she is in agreement with this plan.  We will do this at her next most convenient nondialysis day.  Barbie Banner,  PA-C -[facility] [phone]  Clinic MD:  Early

## 2019-04-15 NOTE — ED Triage Notes (Signed)
Pt endorses chest tightness and neck pain started today. Intermittent pain that last a min. Full HD yesterday.

## 2019-04-16 ENCOUNTER — Telehealth: Payer: Self-pay | Admitting: Cardiovascular Disease

## 2019-04-16 NOTE — Telephone Encounter (Signed)
Pt c/o of Chest Pain: STAT if CP now or developed within 24 hours  1. Are you having CP right now? No  2. Are you experiencing any other symptoms (ex. SOB, nausea, vomiting, sweating)? SOB when CP occurs not right now   3. How long have you been experiencing CP? Since midday yesterday 04/15/19  4. Is your CP continuous or coming and going? Coming and going    5. Have you taken Nitroglycerin? No  ? Karen [name] is calling to report her recent CP. She states it started yesterday and she went to the hospital for it, but ended up leaving. She states the CP comes and goes, but she does experience SOB when it occurs. An appt has been scheduled in regards to this for 04/25/19 with Coletta Memos. Please advise.

## 2019-04-16 NOTE — Telephone Encounter (Signed)
Contacted patient- she states that yesterday she began having on and off chest pains- she states its more like a pressure feeling that just comes and goes, but when it comes she states she feels SOB, like someone is choking her- she denies swelling in her extremities, no med changes, no diet changes. Patient states she went to the ER yesterday- waited 4 hours, and then still had people in front of her so she left. She states they did work her up- checked BP which was normal, chest xray, blood work, Copy. EKG showed NSR, chest xray was completed but nobody was able to go over results with her, blood work was okay- Creatinine was more elevated, but patient states they are needing to redo her fistula she is on dialysis at this time.  I scheduled patient for a sooner appointment on 04/18/19 at 3:15- with Almyra Deforest, PA-  I did advise patient that if the chest pain or difficulty breathing came back- or worsened to go back to ER to be evaluated. Patient verbalized understanding.

## 2019-04-17 ENCOUNTER — Other Ambulatory Visit (HOSPITAL_COMMUNITY): Payer: Self-pay | Admitting: Nephrology

## 2019-04-17 DIAGNOSIS — N186 End stage renal disease: Secondary | ICD-10-CM

## 2019-04-18 ENCOUNTER — Encounter: Payer: Self-pay | Admitting: Cardiovascular Disease

## 2019-04-18 ENCOUNTER — Encounter: Payer: Self-pay | Admitting: Physician Assistant

## 2019-04-18 ENCOUNTER — Other Ambulatory Visit: Payer: Self-pay

## 2019-04-18 ENCOUNTER — Telehealth: Payer: Self-pay | Admitting: Radiology

## 2019-04-18 ENCOUNTER — Ambulatory Visit (INDEPENDENT_AMBULATORY_CARE_PROVIDER_SITE_OTHER): Payer: Medicare Other | Admitting: Physician Assistant

## 2019-04-18 VITALS — BP 99/69 | HR 86 | Temp 97.3°F | Ht 61.0 in | Wt 195.0 lb

## 2019-04-18 DIAGNOSIS — R079 Chest pain, unspecified: Secondary | ICD-10-CM | POA: Diagnosis not present

## 2019-04-18 DIAGNOSIS — I480 Paroxysmal atrial fibrillation: Secondary | ICD-10-CM

## 2019-04-18 DIAGNOSIS — N186 End stage renal disease: Secondary | ICD-10-CM

## 2019-04-18 DIAGNOSIS — Z992 Dependence on renal dialysis: Secondary | ICD-10-CM

## 2019-04-18 DIAGNOSIS — I10 Essential (primary) hypertension: Secondary | ICD-10-CM | POA: Diagnosis not present

## 2019-04-18 NOTE — Progress Notes (Signed)
Cardiology Office Note:    Date:  04/20/2019   ID:  Oleh Genin, DOB 1955-06-01, MRN [id number]  PCP:  Lucianne Lei, MD  Cardiologist:  Quay Burow, MD  Electrophysiologist:  None   Referring MD: Lucianne Lei, MD   Chief Complaint  Patient presents with  . Follow-up    seen for Dr. Gwenlyn Found    History of Present Illness:    Karen [name] is a 64 y.o. female with a hx of HTN, PAF, right breast cancer, heart murmur, GERD and ESRD on HD MWF.  Previous Myoview obtained on 01/23/2014 was low risk with small severe intensity partially reversible apical defect consistent with thinning and mild ischemia.  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  Repeat Myoview obtained on 06/08/2015 showed EF 68%, no reversible ischemia, small scar in the inferolateral wall, overall low risk.  She was admitted in December 2019 with atypical chest pain.  CTA was negative for PE.  She was seen by cardiology service who felt her symptom was more consistent with idiopathic pericarditis.  Cardizem was discontinued.  She was most recently seen by Dr. Gwenlyn Found on 02/20/2019 at which time she denies any chest pain or shortness of breath.  More recently, patient has had some issue with AV fistula stenosis.  She has upcoming peripheral arteriogram and fistulogram planned.  Patient presented to the ED on 04/15/2019 with chest tightness in the neck pain.  Lab work shows creatinine 8.36, hemoglobin 11.4.  High-sensitivity troponin was normal at 10.  Chest x-ray showed a trace left-sided pleural effusion.  EKG appears to be normal.  Patient left before being evaluated by ED physician.  Patient presented for cardiology office visit from dialysis center.  She just finished 4 hours of dialysis this morning.  There were 2 episode of blood pressure drop at 8:10 AM and 10:10 AM, both of which case her systolic blood pressure dropped down to the 80s.  She took 10 mg midodrine prior to each episode.  Otherwise since Tuesday, she has  been having intermittent episodes of chest tightness that lasted about a minute each.  The symptom does not seems to have direct correlation with exertion.  Overall suspicion for advanced CAD fairly low.  I did recommend a Myoview.  Since she has a history of atrial fibrillation, I also question the possibility of underlying rhythm issue causing her symptoms.  I recommended a 3-day Zio monitor.  Past Medical History:  Diagnosis Date  . Anemia   . Arthritis    knees  . Breast cancer ([place]) 2019   Right Breast Cancer  . Cancer ([place])   . Chest pain Jan 2016   low risk Myoview   . CHF (congestive heart failure) ([place])   . Complication of anesthesia 2005   difficulty remembering for a while and waking up  . Constipation   . Dysrhythmia    h/o A-Fib  . ESRD on dialysis [facility]) April 2016   MWF [place]  . GERD (gastroesophageal reflux disease)   . Heart murmur Nov 2015   Aortic scleosis- no stenosis  . Hemodialysis patient ([place])   . Hypertension   . Shortness of breath dyspnea    with exertion    Past Surgical History:  Procedure Laterality Date  . ABDOMINAL HYSTERECTOMY  2005  . AV FISTULA PLACEMENT Left 12/09/2013   Procedure: INSERTION OF ARTERIOVENOUS (AV) GORE-TEX GRAFT ARM;  Surgeon: Elam Dutch, MD;  Location: [place];  Service: Vascular;  Laterality: Left;  .  AV FISTULA PLACEMENT Right 01/01/2018   Procedure: INSERTION OF ARTERIOVENOUS (AV) GORE-TEX GRAFT ARM RIGHT ARM;  Surgeon: Elam Dutch, MD;  Location: [facility] OR;  Service: Vascular;  Laterality: Right;  . AV FISTULA PLACEMENT Right 09/17/2018   Procedure: 1ST STAGE BASILIC ARTERIOVENOUS (AV) FISTULA CREATION RIGHT ARM;  Surgeon: Waynetta Sandy, MD;  Location: [place];  Service: Vascular;  Laterality: Right;  . BASCILIC VEIN TRANSPOSITION Right 11/12/2018   Procedure: SECOND STAGE BASILIC VEIN TRANSPOSITION RIGHT ARM;  Surgeon: Waynetta Sandy, MD;  Location: [place];  Service: Vascular;   Laterality: Right;  . BREAST BIOPSY  1990's  . BUNIONECTOMY Bilateral   . CHOLECYSTECTOMY  2005  . COLONOSCOPY    . IR AV DIALY SHUNT INTRO NEEDLE/INTRACATH INITIAL W/PTA/IMG RIGHT Right 03/04/2019  . IR GENERIC HISTORICAL  01/11/2016   IR US GUIDE VASC ACCESS RIGHT 01/11/2016 Corrie Mckusick, DO [facility]-INTER[facility]  . IR GENERIC HISTORICAL  01/11/2016   IR RADIOLOGY PERIPHERAL GUIDED IV START 01/11/2016 Corrie Mckusick, DO [facility]INTER[facility]  . IR IMAGING GUIDED PORT INSERTION  07/05/2017  . 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 KNEE ARTHROSCOPY Bilateral   . MASTECTOMY W/ SENTINEL NODE BIOPSY Right 11/20/2017  . MASTECTOMY W/ SENTINEL NODE BIOPSY Right 11/20/2017   Procedure: RIGHT MASTECTOMY WITH SENTINEL LYMPH NODE BIOPSY;  Surgeon: Coralie Keens, MD;  Location: [place];  Service: General;  Laterality: Right;  . PERIPHERAL VASCULAR CATHETERIZATION N/A 08/31/2015   Procedure: A/V Shuntogram;  Surgeon: Serafina Mitchell, MD;  Location: [place] CV LAB;  Service: Cardiovascular;  Laterality: N/A;  . PERIPHERAL VASCULAR CATHETERIZATION Left 08/31/2015   Procedure: Peripheral Vascular Balloon Angioplasty;  Surgeon: Serafina Mitchell, MD;  Location: [place] CV LAB;  Service: Cardiovascular;  Laterality: Left;  arm fistula  . PORT-A-CATH REMOVAL N/A 08/12/2018   Procedure: PORT-A-CATH REMOVAL;  Surgeon: Coralie Keens, MD;  Location: [place];  Service: General;  Laterality: N/A;  . ROTATOR CUFF REPAIR Right 1997  . UPPER EXTREMITY VENOGRAPHY Right 12/24/2017   Procedure: UPPER EXTREMITY VENOGRAPHY CENTRAL VENOGRAM;  Surgeon: Waynetta Sandy, MD;  Location: [place] [facility] LAB;  Service: Cardiovascular;  Laterality: Right;  . UPPER EXTREMITY VENOGRAPHY Bilateral 09/02/2018   Procedure: UPPER EXTREMITY VENOGRAPHY;  Surgeon: Waynetta Sandy, MD;  Location: [place] [facility] LAB;  Service: Cardiovascular;  Laterality: Bilateral;    Current Medications: Current Meds  Medication Sig  .  acetaminophen (TYLENOL) 500 MG tablet Take 1,000 mg by mouth daily as needed for mild pain, moderate pain or headache.   Marland Kitchen amoxicillin (AMOXIL) 500 MG capsule Take 2,000 mg by mouth See admin instructions. Take 4 capsules (2000 mg) by mouth one hour prior to dental procedures  . clobetasol cream (TEMOVATE) 2.95 % Apply 1 application topically daily as needed (for rash).   Marland Kitchen diltiazem (CARDIZEM) 30 MG tablet PLEASE SEE ATTACHED FOR DETAILED DIRECTIONS (Patient taking differently: Take 15 mg by mouth every Monday, Wednesday, and Friday. )  . ethyl chloride spray Apply topically as needed (Dialysis).  Marland Kitchen HYDROcodone-acetaminophen (NORCO) 5-325 MG tablet Take 1 tablet by mouth every 6 (six) hours as needed for moderate pain.  Marland Kitchen lidocaine-prilocaine (EMLA) cream Apply 1 application topically See admin instructions. Apply topically Monday, Wednesday and Friday before dialysis  . midodrine (PROAMATINE) 10 MG tablet Take 20 mg by mouth every Monday, Wednesday, and Friday.   . multivitamin (RENA-VIT) TABS tablet Take 1 tablet by mouth daily.   . ondansetron (ZOFRAN)  8 MG tablet Take 8 mg by mouth every 8 (eight) hours as needed for nausea or vomiting.  . pantoprazole (PROTONIX) 40 MG tablet TAKE 1 TABLET (40 MG TOTAL) BY MOUTH DAILY. (Patient taking differently: Take 40 mg by mouth daily. )  . prochlorperazine (COMPAZINE) 10 MG tablet Take 10 mg by mouth every 6 (six) hours as needed (Nausea or vomiting).  . sevelamer carbonate (RENVELA) 800 MG tablet Take 1,600-2,400 mg by mouth See admin instructions. Take 2400 mg by mouth with each meal & 1 tablet 1600 mg by mouth with each snack.  . simvastatin (ZOCOR) 20 MG tablet TAKE 1 TABLET BY MOUTH EVERYDAY AT BEDTIME (Patient taking differently: Take 20 mg by mouth at bedtime. )     Allergies:   Patient has no known allergies.   Social History   Socioeconomic History  . Marital status: Married    Spouse name: Not on file  . Number of children: 2  . Years of  education: Not on file  . Highest education level: Not on file  Occupational History  . Occupation: SHERIFF'S OFFICE    Employer: [employer]  Tobacco Use  . Smoking status: Never Smoker  . Smokeless tobacco: Never Used  Substance and Sexual Activity  . Alcohol use: No  . Drug use: No  . Sexual activity: Not on file    Comment: Hysterectomy  Other Topics Concern  . Not on file  Social History Narrative  . Not on file   Social Determinants of Health   Financial Resource Strain:   . Difficulty of Paying Living Expenses:   Food Insecurity:   . Worried About Charity fundraiser in the Last Year:   . Arboriculturist in the Last Year:   Transportation Needs:   . Film/video editor (Medical):   Marland Kitchen Lack of Transportation (Non-Medical):   Physical Activity:   . Days of Exercise per Week:   . Minutes of Exercise per Session:   Stress:   . Feeling of Stress :   Social Connections:   . Frequency of Communication with Friends and Family:   . Frequency of Social Gatherings with Friends and Family:   . Attends Religious Services:   . Active Member of Clubs or Organizations:   . Attends Archivist Meetings:   Marland Kitchen Marital Status:      Family History: The patient's family history includes Diabetes in her sister; Heart attack in her father; Hyperlipidemia in her sister; Hypertension in her sister; Kidney disease in her father, mother, and sister.  ROS:   Please see the history of present illness.     All other systems reviewed and are negative.  EKGs/Labs/Other Studies Reviewed:    The following studies were reviewed today:  Myoview 06/08/2015 IMPRESSION: 1. No reversible ischemia.  Small scar in the inferolateral wall.  2. Normal left ventricular wall motion.  3. Left ventricular ejection fraction 68%  4. Non invasive risk stratification*: Low  EKG:  EKG is ordered today.  The ekg ordered today demonstrates NSR without significant ST-T wave  changes  Recent Labs: 08/06/2018: ALT 13 04/15/2019: BUN 38; Creatinine, Ser 8.36; Hemoglobin 11.4; Platelets 166; Potassium 4.1; Sodium 137  Recent Lipid Panel    Component Value Date/Time   CHOL 165 06/08/2015 0704   TRIG 44 06/08/2015 0704   HDL 59 06/08/2015 0704   CHOLHDL 2.8 06/08/2015 0704   VLDL 9 06/08/2015 0704   LDLCALC 97 06/08/2015 0704  Physical Exam:    VS:  BP 99/69   Pulse 86   Temp (!) 97.3 F (36.3 C)   Ht 5\' 1"  (1.549 m)   Wt 195 lb (88.5 kg)   SpO2 98%   BMI 36.84 kg/m     Wt Readings from Last 3 Encounters:  04/18/19 195 lb (88.5 kg)  04/15/19 197 lb 4.8 oz (89.5 kg)  03/04/19 195 lb (88.5 kg)     GEN:  Well nourished, well developed in no acute distress HEENT: Normal NECK: No JVD; No carotid bruits LYMPHATICS: No lymphadenopathy CARDIAC: RRR, no murmurs, rubs, gallops RESPIRATORY:  Clear to auscultation without rales, wheezing or rhonchi  ABDOMEN: Soft, non-tender, non-distended MUSCULOSKELETAL:  No edema; No deformity  SKIN: Warm and dry NEUROLOGIC:  Alert and oriented x 3 PSYCHIATRIC:  Normal affect   ASSESSMENT:    1. Chest pain, unspecified type   2. Paroxysmal atrial fibrillation (HCC)   3. Essential hypertension   4. ESRD on dialysis [facility])    PLAN:    In order of problems listed above:  1. Chest pain: Her chest pain is fairly atypical, suspicion for ACS fairly low.  Unfortunately she did not wait until finishing the ED evaluation before leaving AMA.  I recommend a Myoview.  There is also a chance that her chest pain could be related to recurrent atrial fibrillation.  Therefore I recommended a 3 day Zio monitor  2. Paroxysmal atrial fibrillation: On diltiazem.  Not on systemic anticoagulation given lack of recurrence.  3. Hypertension: Blood pressure borderline low.  She often require midodrine during dialysis given drop in the blood pressure  4. End-stage renal disease on hemodialysis: Monday Wednesday and Friday.  Followed  by nephrology.   Medication Adjustments/Labs and Tests Ordered: Current medicines are reviewed at length with the patient today.  Concerns regarding medicines are outlined above.  Orders Placed This Encounter  Procedures  . MYOCARDIAL PERFUSION IMAGING  . LONG TERM MONITOR (3-14 DAYS)  . EKG 12-Lead   No orders of the defined types were placed in this encounter.   Patient Instructions  Medication Instructions:  Your physician recommends that you continue on your current medications as directed. Please refer to the Current Medication list given to you today.  *If you need a refill on your cardiac medications before your next appointment, please call your pharmacy*  Lab Work: NONE ordered at this time of appointment   If you have labs (blood work) drawn today and your tests are completely normal, you will receive your results only by: Marland Kitchen MyChart Message (if you have MyChart) OR . A paper copy in the mail If you have any lab test that is abnormal or we need to change your treatment, we will call you to review the results.  Testing/Procedures: Your physician has requested that you have a lexiscan myoview. This test is done at [address]  Cardiac Nuclear Scan A cardiac nuclear scan is a test that is done to check the flow of blood to your heart. It is done when you are resting and when you are exercising. The test looks for problems such as:  Not enough blood reaching a portion of the heart.  The heart muscle not working as it should. You may need this test if:  You have heart disease.  You have had lab results that are not normal.  You have had heart surgery or a balloon procedure to open up blocked arteries (angioplasty).  You have chest pain.  You have shortness of breath. In this test, a special dye (tracer) is put into your bloodstream. The tracer will travel to your heart. A camera will then take pictures of your heart to see how the tracer moves  through your heart. This test is usually done at a hospital and takes 2-4 hours. Tell a doctor about:  Any allergies you have.  All medicines you are taking, including vitamins, herbs, eye drops, creams, and over-the-counter medicines.  Any problems you or family members have had with anesthetic medicines.  Any blood disorders you have.  Any surgeries you have had.  Any medical conditions you have.  Whether you are pregnant or may be pregnant. What are the risks? Generally, this is a safe test. However, problems may occur, such as:  Serious chest pain and heart attack. This is only a risk if the stress portion of the test is done.  Rapid heartbeat.  A feeling of warmth in your chest. This feeling usually does not last long.  Allergic reaction to the tracer. What happens before the test?  Ask your doctor about changing or stopping your normal medicines. This is important.  Follow instructions from your doctor about what you cannot eat or drink.  Remove your jewelry on the day of the test. What happens during the test?  An IV tube will be inserted into one of your veins.  Your doctor will give you a small amount of tracer through the IV tube.  You will wait for 20-40 minutes while the tracer moves through your bloodstream.  Your heart will be monitored with an electrocardiogram (ECG).  You will lie down on an exam table.  Pictures of your heart will be taken for about 15-20 minutes.  You may also have a stress test. For this test, one of these things may be done: ? You will be asked to exercise on a treadmill or a stationary bike. ? You will be given medicines that will make your heart work harder. This is done if you are unable to exercise.  When blood flow to your heart has peaked, a tracer will again be given through the IV tube.  After 20-40 minutes, you will get back on the exam table. More pictures will be taken of your heart.  Depending on the tracer that  is used, more pictures may need to be taken 3-4 hours later.  Your IV tube will be removed when the test is over. The test may vary among doctors and hospitals. What happens after the test?  Ask your doctor: ? Whether you can return to your normal schedule, including diet, activities, and medicines. ? Whether you should drink more fluids. This will help to remove the tracer from your body. Drink enough fluid to keep your pee (urine) pale yellow.  Ask your doctor, or the department that is doing the test: ? When will my results be ready? ? How will I get my results? Summary  A cardiac nuclear scan is a test that is done to check the flow of blood to your heart.  Tell your doctor whether you are pregnant or may be pregnant.  Before the test, ask your doctor about changing or stopping your normal medicines. This is important.  Ask your doctor whether you can return to your normal activities. You may be asked to drink more fluids. This information is not intended to replace advice given to you by your health care provider. Make sure you  discuss any questions you have with your health care provider. Document Revised: 04/24/2018 Document Reviewed: 06/18/2017 Elsevier Patient Education  2020 [place] Term Monitor Instructions   Your physician has requested you wear your ZIO patch monitor 3 days.   This is a single patch monitor.  [employer] supplies one patch monitor per enrollment.  Additional stickers are not available.   Please do not apply patch if you will be having a Nuclear Stress Test, Echocardiogram, Cardiac CT, MRI, or Chest Xray during the time frame you would be wearing the monitor. The patch cannot be worn during these tests.  You cannot remove and re-apply the ZIO XT patch monitor.   Your ZIO patch monitor will be sent USPS Priority mail from [facility] directly to your home address. The monitor may also be mailed to a PO BOX if home delivery is  not available.   It may take 3-5 days to receive your monitor after you have been enrolled.   Once you have received you monitor, please review enclosed instructions.  Your monitor has already been registered assigning a specific monitor serial # to you.   Applying the monitor   Shave hair from upper left chest.   Hold abrader disc by orange tab.  Rub abrader in 40 strokes over left upper chest as indicated in your monitor instructions.   Clean area with 4 enclosed alcohol pads .  Use all pads to assure are is cleaned thoroughly.  Let dry.   Apply patch as indicated in monitor instructions.  Patch will be place under collarbone on left side of chest with arrow pointing upward.   Rub patch adhesive wings for 2 minutes.Remove white label marked "1".  Remove white label marked "2".  Rub patch adhesive wings for 2 additional minutes.   While looking in a mirror, press and release button in center of patch.  A small green light will flash 3-4 times .  This will be your only indicator the monitor has been turned on.     Do not shower for the first 24 hours.  You may shower after the first 24 hours.   Press button if you feel a symptom. You will hear a small click.  Record Date, Time and Symptom in the Patient Log Book.   When you are ready to remove patch, follow instructions on last 2 pages of Patient Log Book.  Stick patch monitor onto last page of Patient Log Book.   Place Patient Log Book in [place] box.  Use locking tab on box and tape box closed securely.  The [employer] has [employer] on it.  Please place in mailbox as soon as possible.  Your physician should have your test results approximately 7 days after the monitor has been mailed back to [facility].   Call [place] at [phone] if you have questions regarding your ZIO XT patch monitor.  Call them immediately if you see an orange light blinking on your monitor.   If your monitor falls off in  less than 4 days contact our Monitor department at [phone].  If your monitor becomes loose or falls off after 4 days call [employer] at [phone] for suggestions on securing your monitor.   Follow-Up: At [facility], you and your health needs are our priority.  As part of our continuing mission to provide you with exceptional heart care, we have created designated Provider Care Teams.  These Care Teams  include your primary Cardiologist (physician) and Advanced Practice Providers (APPs -  Physician Assistants and Nurse Practitioners) who all work together to provide you with the care you need, when you need it.  We recommend signing up for the patient portal called "MyChart".  Sign up information is provided on this After Visit Summary.  MyChart is used to connect with patients for Virtual Visits (Telemedicine).  Patients are able to view lab/test results, encounter notes, upcoming appointments, etc.  Non-urgent messages can be sent to your provider as well.   To learn more about what you can do with MyChart, go to [email or web address].    Your next appointment:   2-3 month(s)  The format for your next appointment:   In Person  Provider:   Quay Burow, MD  Other Instructions      Signed, Almyra Deforest, [place]  04/20/2019 11:25 PM    [place]

## 2019-04-18 NOTE — Patient Instructions (Signed)
Medication Instructions:  Your physician recommends that you continue on your current medications as directed. Please refer to the Current Medication list given to you today.  *If you need a refill on your cardiac medications before your next appointment, please call your pharmacy*  Lab Work: NONE ordered at this time of appointment   If you have labs (blood work) drawn today and your tests are completely normal, you will receive your results only by: Marland Kitchen MyChart Message (if you have MyChart) OR . A paper copy in the mail If you have any lab test that is abnormal or we need to change your treatment, we will call you to review the results.  Testing/Procedures: Your physician has requested that you have a lexiscan myoview. This test is done at [address]  Cardiac Nuclear Scan A cardiac nuclear scan is a test that is done to check the flow of blood to your heart. It is done when you are resting and when you are exercising. The test looks for problems such as:  Not enough blood reaching a portion of the heart.  The heart muscle not working as it should. You may need this test if:  You have heart disease.  You have had lab results that are not normal.  You have had heart surgery or a balloon procedure to open up blocked arteries (angioplasty).  You have chest pain.  You have shortness of breath. In this test, a special dye (tracer) is put into your bloodstream. The tracer will travel to your heart. A camera will then take pictures of your heart to see how the tracer moves through your heart. This test is usually done at a hospital and takes 2-4 hours. Tell a doctor about:  Any allergies you have.  All medicines you are taking, including vitamins, herbs, eye drops, creams, and over-the-counter medicines.  Any problems you or family members have had with anesthetic medicines.  Any blood disorders you have.  Any surgeries you have had.  Any medical conditions you  have.  Whether you are pregnant or may be pregnant. What are the risks? Generally, this is a safe test. However, problems may occur, such as:  Serious chest pain and heart attack. This is only a risk if the stress portion of the test is done.  Rapid heartbeat.  A feeling of warmth in your chest. This feeling usually does not last long.  Allergic reaction to the tracer. What happens before the test?  Ask your doctor about changing or stopping your normal medicines. This is important.  Follow instructions from your doctor about what you cannot eat or drink.  Remove your jewelry on the day of the test. What happens during the test?  An IV tube will be inserted into one of your veins.  Your doctor will give you a small amount of tracer through the IV tube.  You will wait for 20-40 minutes while the tracer moves through your bloodstream.  Your heart will be monitored with an electrocardiogram (ECG).  You will lie down on an exam table.  Pictures of your heart will be taken for about 15-20 minutes.  You may also have a stress test. For this test, one of these things may be done: ? You will be asked to exercise on a treadmill or a stationary bike. ? You will be given medicines that will make your heart work harder. This is done if you are unable to exercise.  When blood flow to  your heart has peaked, a tracer will again be given through the IV tube.  After 20-40 minutes, you will get back on the exam table. More pictures will be taken of your heart.  Depending on the tracer that is used, more pictures may need to be taken 3-4 hours later.  Your IV tube will be removed when the test is over. The test may vary among doctors and hospitals. What happens after the test?  Ask your doctor: ? Whether you can return to your normal schedule, including diet, activities, and medicines. ? Whether you should drink more fluids. This will help to remove the tracer from your body. Drink  enough fluid to keep your pee (urine) pale yellow.  Ask your doctor, or the department that is doing the test: ? When will my results be ready? ? How will I get my results? Summary  A cardiac nuclear scan is a test that is done to check the flow of blood to your heart.  Tell your doctor whether you are pregnant or may be pregnant.  Before the test, ask your doctor about changing or stopping your normal medicines. This is important.  Ask your doctor whether you can return to your normal activities. You may be asked to drink more fluids. This information is not intended to replace advice given to you by your health care provider. Make sure you discuss any questions you have with your health care provider. Document Revised: 04/24/2018 Document Reviewed: 06/18/2017 Elsevier Patient Education  2020 [place] [employer] Monitor Instructions   Your physician has requested you wear your ZIO patch monitor 3 days.   This is a single patch monitor.  [employer] supplies one patch monitor per enrollment.  Additional stickers are not available.   Please do not apply patch if you will be having a Nuclear Stress Test, Echocardiogram, Cardiac CT, MRI, or Chest Xray during the time frame you would be wearing the monitor. The patch cannot be worn during these tests.  You cannot remove and re-apply the ZIO XT patch monitor.   Your ZIO patch monitor will be sent USPS Priority mail from [facility] directly to your home address. The monitor may also be mailed to a PO BOX if home delivery is not available.   It may take 3-5 days to receive your monitor after you have been enrolled.   Once you have received you monitor, please review enclosed instructions.  Your monitor has already been registered assigning a specific monitor serial # to you.   Applying the monitor   Shave hair from upper left chest.   Hold abrader disc by orange tab.  Rub abrader in 40 strokes over left upper chest as  indicated in your monitor instructions.   Clean area with 4 enclosed alcohol pads .  Use all pads to assure are is cleaned thoroughly.  Let dry.   Apply patch as indicated in monitor instructions.  Patch will be place under collarbone on left side of chest with arrow pointing upward.   Rub patch adhesive wings for 2 minutes.Remove white label marked "1".  Remove white label marked "2".  Rub patch adhesive wings for 2 additional minutes.   While looking in a mirror, press and release button in center of patch.  A small green light will flash 3-4 times .  This will be your only indicator the monitor has been turned on.     Do not shower for the first 24 hours.  You may shower after the first 24 hours.   Press button if you feel a symptom. You will hear a small click.  Record Date, Time and Symptom in the Patient Log Book.   When you are ready to remove patch, follow instructions on last 2 pages of Patient Log Book.  Stick patch monitor onto last page of Patient Log Book.   Place Patient Log Book in [place] box.  Use locking tab on box and tape box closed securely.  The [employer] has [employer] on it.  Please place in mailbox as soon as possible.  Your physician should have your test results approximately 7 days after the monitor has been mailed back to [facility].   Call [place] at [phone] if you have questions regarding your ZIO XT patch monitor.  Call them immediately if you see an orange light blinking on your monitor.   If your monitor falls off in less than 4 days contact our Monitor department at [phone].  If your monitor becomes loose or falls off after 4 days call [employer] at [phone] for suggestions on securing your monitor.   Follow-Up: At [facility], you and your health needs are our priority.  As part of our continuing mission to provide you with exceptional heart care, we have created designated Provider Care Teams.  These  Care Teams include your primary Cardiologist (physician) and Advanced Practice Providers (APPs -  Physician Assistants and Nurse Practitioners) who all work together to provide you with the care you need, when you need it.  We recommend signing up for the patient portal called "MyChart".  Sign up information is provided on this After Visit Summary.  MyChart is used to connect with patients for Virtual Visits (Telemedicine).  Patients are able to view lab/test results, encounter notes, upcoming appointments, etc.  Non-urgent messages can be sent to your provider as well.   To learn more about what you can do with MyChart, go to [email or web address].    Your next appointment:   2-3 month(s)  The format for your next appointment:   In Person  Provider:   Quay Burow, MD  Other Instructions

## 2019-04-18 NOTE — Telephone Encounter (Signed)
Enrolled patient for a 3 day Zio monitor to be mailed to patients home.  

## 2019-04-19 ENCOUNTER — Other Ambulatory Visit (HOSPITAL_COMMUNITY)
Admission: RE | Admit: 2019-04-19 | Discharge: 2019-04-19 | Disposition: A | Discharge status: Home / Self Care | Payer: Medicare Other | Source: Ambulatory Visit | Attending: Surgery | Admitting: Surgery

## 2019-04-19 DIAGNOSIS — Z20822 Contact with and (suspected) exposure to covid-19: Secondary | ICD-10-CM | POA: Insufficient documentation

## 2019-04-19 DIAGNOSIS — Z01812 Encounter for preprocedural laboratory examination: Secondary | ICD-10-CM | POA: Diagnosis present

## 2019-04-19 LAB — SARS CORONAVIRUS 2 (TAT 6-24 HRS): SARS Coronavirus 2: NEGATIVE

## 2019-04-20 ENCOUNTER — Encounter: Payer: Self-pay | Admitting: Physician Assistant

## 2019-04-22 ENCOUNTER — Encounter (HOSPITAL_COMMUNITY)
Admission: RE | Disposition: A | Discharge status: Home / Self Care | Payer: Self-pay | Source: Home / Self Care | Attending: Surgery

## 2019-04-22 ENCOUNTER — Other Ambulatory Visit: Payer: Self-pay

## 2019-04-22 ENCOUNTER — Ambulatory Visit (HOSPITAL_COMMUNITY)
Admission: RE | Admit: 2019-04-22 | Discharge: 2019-04-22 | Disposition: A | Discharge status: Home / Self Care | Payer: Medicare Other | Attending: Surgery | Admitting: Surgery

## 2019-04-22 DIAGNOSIS — Z79899 Other long term (current) drug therapy: Secondary | ICD-10-CM | POA: Insufficient documentation

## 2019-04-22 DIAGNOSIS — Y832 Surgical operation with anastomosis, bypass or graft as the cause of abnormal reaction of the patient, or of later complication, without mention of misadventure at the time of the procedure: Secondary | ICD-10-CM | POA: Insufficient documentation

## 2019-04-22 DIAGNOSIS — Z992 Dependence on renal dialysis: Secondary | ICD-10-CM | POA: Insufficient documentation

## 2019-04-22 DIAGNOSIS — T82858A Stenosis of vascular prosthetic devices, implants and grafts, initial encounter: Secondary | ICD-10-CM | POA: Diagnosis present

## 2019-04-22 DIAGNOSIS — T82898A Other specified complication of vascular prosthetic devices, implants and grafts, initial encounter: Secondary | ICD-10-CM

## 2019-04-22 DIAGNOSIS — N186 End stage renal disease: Secondary | ICD-10-CM

## 2019-04-22 HISTORY — PX: PERIPHERAL VASCULAR INTERVENTION: CATH118257

## 2019-04-22 HISTORY — PX: A/V FISTULAGRAM: CATH118298

## 2019-04-22 LAB — POCT I-STAT, CHEM 8
BUN: 46 mg/dL — ABNORMAL HIGH (ref 8–23)
Calcium, Ion: 1.04 mmol/L — ABNORMAL LOW (ref 1.15–1.40)
Chloride: 97 mmol/L — ABNORMAL LOW (ref 98–111)
Creatinine, Ser: 8.4 mg/dL — ABNORMAL HIGH (ref 0.44–1.00)
Glucose, Bld: 95 mg/dL (ref 70–99)
HCT: 40 % (ref 36.0–46.0)
Hemoglobin: 13.6 g/dL (ref 12.0–15.0)
Potassium: 5.1 mmol/L (ref 3.5–5.1)
Sodium: 137 mmol/L (ref 135–145)
TCO2: 36 mmol/L — ABNORMAL HIGH (ref 22–32)

## 2019-04-22 SURGERY — A/V FISTULAGRAM
Anesthesia: LOCAL | Laterality: Right

## 2019-04-22 MED ORDER — FENTANYL CITRATE (PF) 100 MCG/2ML IJ SOLN
INTRAMUSCULAR | Status: AC
  Filled 2019-04-22: qty 2

## 2019-04-22 MED ORDER — MIDAZOLAM HCL 2 MG/2ML IJ SOLN
INTRAMUSCULAR | Status: DC | PRN
  Administered 2019-04-22: 1 mg via INTRAVENOUS

## 2019-04-22 MED ORDER — FENTANYL CITRATE (PF) 100 MCG/2ML IJ SOLN
INTRAMUSCULAR | Status: DC | PRN
  Administered 2019-04-22: 25 ug via INTRAVENOUS

## 2019-04-22 MED ORDER — MIDAZOLAM HCL 2 MG/2ML IJ SOLN
INTRAMUSCULAR | Status: AC
  Filled 2019-04-22: qty 2

## 2019-04-22 MED ORDER — SODIUM CHLORIDE 0.9% FLUSH: 3.0000 mL | INTRAVENOUS | Status: DC | PRN

## 2019-04-22 MED ORDER — HEPARIN (PORCINE) IN NACL 1000-0.9 UT/500ML-% IV SOLN
INTRAVENOUS | Status: DC | PRN
  Administered 2019-04-22: 500 mL

## 2019-04-22 MED ORDER — LIDOCAINE HCL (PF) 1 % IJ SOLN
INTRAMUSCULAR | Status: DC | PRN
  Administered 2019-04-22: 2 mL

## 2019-04-22 MED ORDER — SODIUM CHLORIDE 0.9 % IV SOLN: 250.0000 mL | INTRAVENOUS | Status: DC | PRN

## 2019-04-22 MED ORDER — HEPARIN (PORCINE) IN NACL 1000-0.9 UT/500ML-% IV SOLN
INTRAVENOUS | Status: AC
  Filled 2019-04-22: qty 500

## 2019-04-22 MED ORDER — IODIXANOL 320 MG/ML IV SOLN
INTRAVENOUS | Status: DC | PRN
  Administered 2019-04-22: 20 mL

## 2019-04-22 MED ORDER — LIDOCAINE HCL (PF) 1 % IJ SOLN
INTRAMUSCULAR | Status: AC
  Filled 2019-04-22: qty 30

## 2019-04-22 MED ORDER — SODIUM CHLORIDE 0.9% FLUSH: 3.0000 mL | Freq: Two times a day (BID) | INTRAVENOUS | Status: DC

## 2019-04-22 SURGICAL SUPPLY — 16 items
BAG SNAP BAND KOVER 36X36 (MISCELLANEOUS) ×2 IMPLANT
BALLN LUTONIX AV 9X60X75 (BALLOONS) ×2
BALLN MUSTANG 7.0X20 75 (BALLOONS) ×2
BALLOON LUTONIX AV 9X60X75 (BALLOONS) IMPLANT
BALLOON MUSTANG 7.0X20 75 (BALLOONS) IMPLANT
COVER DOME SNAP 22 D (MISCELLANEOUS) ×2 IMPLANT
KIT ENCORE 26 ADVANTAGE (KITS) ×1 IMPLANT
KIT MICROPUNCTURE NIT STIFF (SHEATH) ×1 IMPLANT
PROTECTION STATION PRESSURIZED (MISCELLANEOUS) ×2
SHEATH PINNACLE R/O II 7F 4CM (SHEATH) ×1 IMPLANT
SHEATH PROBE COVER 6X72 (BAG) ×2 IMPLANT
STATION PROTECTION PRESSURIZED (MISCELLANEOUS) ×1 IMPLANT
STOPCOCK MORSE 400PSI 3WAY (MISCELLANEOUS) ×2 IMPLANT
TRAY PV CATH (CUSTOM PROCEDURE TRAY) ×2 IMPLANT
TUBING CIL FLEX 10 FLL-RA (TUBING) ×2 IMPLANT
WIRE BENTSON .035X145CM (WIRE) ×1 IMPLANT

## 2019-04-22 NOTE — Op Note (Signed)
    Patient name: Karen [name] MRN: [id number] DOB: 1955-05-18 Sex: female  04/22/2019 Pre-operative Diagnosis: ESRD Post-operative diagnosis:  Same Surgeon:  [name] Procedure Performed:  1.  Ultrasound-guided access, right arm fistula  2.  Fistulogram  3.  Drug-coated balloon venoplasty, right basilic vein  4.  Conscious sedation, 19 minutes   Indications: The patient continues to have low flow rates with dialysis.  She underwent intervention by radiology in February where a 7 mm balloon was used.  She is here for further evaluation.  Procedure:  The patient was identified in the holding area and taken to room 8.  The patient was then placed supine on the table and prepped and draped in the usual sterile fashion.  A time out was called.  Conscious sedation was administered with the use of IV fentanyl and Versed under continuous physician and nurse monitoring.  Heart rate, blood pressure, and oxygen saturations were continuously monitored.  Total sedation time was 19 minutes ultrasound was used to evaluate the fistula.  The vein was patent and compressible.  A digital ultrasound image was acquired.  The fistula was then accessed under ultrasound guidance using a micropuncture needle.  An 018 wire was then asvanced without resistance and a micropuncture sheath was placed.  Contrast injections were then performed through the sheath.  Findings: The arterial venous anastomosis is widely patent.  The central venous system is widely patent.  The vein in the upper arm has 1 area of narrowing of approximately 60%.   Intervention: I have above images were acquired decision made proceed with intervention.  Over a Bentson wire, a 7 French sheath was placed.  I then selected a 9 x 60 Lutonix drug-coated balloon and perform balloon angioplasty of the stenotic area.  The balloon was taken to 24 atm.  There was still a waist.  The patient had significant pain with inflation to this level.  The balloon was  held up for 2 and half minutes.  On follow-up imaging, there appeared to be a improved result with residual stenosis and so I inserted a 7 x 20 balloon across the stenotic area and took this to 30 atm.  Follow-up imaging showed a diameter change in the vein at this level, however it did not appear to be stenotic.  I did not feel that she would tolerate a larger balloon so I elected to stop.  If she has a early recurrence, she may benefit from surgical correction of this area.  Sheath was removed with suture closure.  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  A 9 x 60 drug-coated balloon was used.  Despite taking this to nominal pressure, there was a residual waist.  The patient had significant discomfort with this balloon inflation to high pressures.  I elected not to increase the balloon size.  I did use a 7 x 20 balloon to treat the focal lesion and she tolerated this without difficulty.  #2  If the patient has a early recurrence of her stenosis, she may benefit from surgical intervention.     Theotis Burrow, M.D., [facility] [place] Office: [phone] Pager:  [phone]

## 2019-04-22 NOTE — Discharge Instructions (Signed)

## 2019-04-22 NOTE — Progress Notes (Signed)
Discharge instructions reviewed with pt and her grandson (via telephone( voices understanding.

## 2019-04-22 NOTE — Interval H&P Note (Signed)
History and Physical Interval Note:  04/22/2019 9:43 AM  Karen [name]  has presented today for surgery, with the diagnosis of peripheral vascular disease.  The various methods of treatment have been discussed with the patient and family. After consideration of risks, benefits and other options for treatment, the patient has consented to  Procedure(s): A/V FISTULAGRAM - Right Upper (N/A) as a surgical intervention.  The patient's history has been reviewed, patient examined, no change in status, stable for surgery.  I have reviewed the patient's chart and labs.  Questions were answered to the patient's satisfaction.     [name]

## 2019-04-24 ENCOUNTER — Telehealth: Payer: Self-pay | Admitting: Cardiovascular Disease

## 2019-04-24 NOTE — Telephone Encounter (Signed)
New Message:   Pt says she is unable to wear the Monitor. She says where she need to put it, she have her her Cathter for Dialysis ithere

## 2019-04-25 ENCOUNTER — Ambulatory Visit: Payer: Medicare Other | Admitting: General Practice

## 2019-04-29 ENCOUNTER — Telehealth (HOSPITAL_COMMUNITY): Payer: Self-pay

## 2019-04-29 NOTE — Telephone Encounter (Signed)
Patient following up.

## 2019-04-29 NOTE — Telephone Encounter (Signed)
Encounter complete. 

## 2019-04-29 NOTE — Telephone Encounter (Signed)
Pt calling stating she has questions about monitor. She report she was told she can't where monitor because she has a dialysis port. Will route to monitor department.

## 2019-04-30 NOTE — Telephone Encounter (Signed)
Patient was told she could not wear monitor because of her dialysis port.  Explained to patient the monitor should be place directly under the left collarbone with the arrow pointing straight up.   If this is overlapping her dialysis port the monitor placement can be adjusted as long as it is on her upper left chest or upper left back.  Patient is scheduled for Nuclear Med. Test  05/01/2019 and will ask staff to assist applying monitor.  Explained to patient delay in applying patch is beneficial because her patch monitor could not be removed for her Nuclear Medicine test.  The Zio patch is not designed to remove, and will not stick if removed and re-applied.  Patient asked to call back if NL staff unable to assist in applying patch.  We will arrange to schedule remote staff to apply monitor at  the [address] office.

## 2019-05-01 ENCOUNTER — Telehealth: Payer: Self-pay | Admitting: *Deleted

## 2019-05-01 ENCOUNTER — Other Ambulatory Visit: Payer: Self-pay

## 2019-05-01 ENCOUNTER — Ambulatory Visit (HOSPITAL_COMMUNITY)
Admission: RE | Admit: 2019-05-01 | Discharge: 2019-05-01 | Disposition: A | Discharge status: Home / Self Care | Payer: Medicare Other | Source: Ambulatory Visit | Attending: Cardiology | Admitting: Cardiology

## 2019-05-01 ENCOUNTER — Ambulatory Visit (INDEPENDENT_AMBULATORY_CARE_PROVIDER_SITE_OTHER): Payer: Medicare Other

## 2019-05-01 DIAGNOSIS — I480 Paroxysmal atrial fibrillation: Secondary | ICD-10-CM

## 2019-05-01 LAB — MYOCARDIAL PERFUSION IMAGING
LV dias vol: 61 mL (ref 46–106)
LV sys vol: 17 mL
Peak HR: 105 {beats}/min
Rest HR: 74 {beats}/min
SDS: 3
SRS: 5
SSS: 8
TID: 0.97

## 2019-05-01 MED ORDER — REGADENOSON 0.4 MG/5ML IV SOLN
0.4000 mg | Freq: Once | INTRAVENOUS | Status: AC
  Administered 2019-05-01: 0.4 mg via INTRAVENOUS

## 2019-05-01 MED ORDER — AMINOPHYLLINE 25 MG/ML IV SOLN
75.0000 mg | Freq: Once | INTRAVENOUS | Status: AC
  Administered 2019-05-01: 75 mg via INTRAVENOUS

## 2019-05-01 MED ORDER — TECHNETIUM TC 99M TETROFOSMIN IV KIT
31.2000 | PACK | Freq: Once | INTRAVENOUS | Status: AC | PRN
  Administered 2019-05-01: 31.2 via INTRAVENOUS
  Filled 2019-05-01: qty 32

## 2019-05-01 MED ORDER — TECHNETIUM TC 99M TETROFOSMIN IV KIT
10.6000 | PACK | Freq: Once | INTRAVENOUS | Status: AC | PRN
  Administered 2019-05-01: 10.6 via INTRAVENOUS
  Filled 2019-05-01: qty 11

## 2019-05-01 NOTE — Telephone Encounter (Signed)
The patient came in for a nuclear stress test and asked for help applying her Zio Monitor. This has been placed and instructions given. If she has any further questions, she has been advised to call the office back or reach out to [facility]. Marland Kitchen

## 2019-05-02 NOTE — Progress Notes (Signed)
Excellent stress test result, low risk study, no sign of severe reversible blockage

## 2019-05-15 ENCOUNTER — Encounter (HOSPITAL_COMMUNITY): Payer: Self-pay | Admitting: Nurse Practitioner

## 2019-05-15 ENCOUNTER — Ambulatory Visit (HOSPITAL_COMMUNITY)
Admission: RE | Admit: 2019-05-15 | Discharge: 2019-05-15 | Disposition: A | Discharge status: Home / Self Care | Payer: Medicare Other | Source: Ambulatory Visit | Attending: Nurse Practitioner | Admitting: Nurse Practitioner

## 2019-05-15 ENCOUNTER — Other Ambulatory Visit: Payer: Self-pay

## 2019-05-15 VITALS — BP 118/60 | HR 90 | Ht 61.0 in | Wt 195.2 lb

## 2019-05-15 DIAGNOSIS — Z841 Family history of disorders of kidney and ureter: Secondary | ICD-10-CM | POA: Diagnosis not present

## 2019-05-15 DIAGNOSIS — I959 Hypotension, unspecified: Secondary | ICD-10-CM | POA: Diagnosis not present

## 2019-05-15 DIAGNOSIS — N186 End stage renal disease: Secondary | ICD-10-CM | POA: Insufficient documentation

## 2019-05-15 DIAGNOSIS — I509 Heart failure, unspecified: Secondary | ICD-10-CM | POA: Insufficient documentation

## 2019-05-15 DIAGNOSIS — Z9221 Personal history of antineoplastic chemotherapy: Secondary | ICD-10-CM | POA: Insufficient documentation

## 2019-05-15 DIAGNOSIS — I480 Paroxysmal atrial fibrillation: Secondary | ICD-10-CM | POA: Insufficient documentation

## 2019-05-15 DIAGNOSIS — Z79899 Other long term (current) drug therapy: Secondary | ICD-10-CM | POA: Insufficient documentation

## 2019-05-15 DIAGNOSIS — Z8249 Family history of ischemic heart disease and other diseases of the circulatory system: Secondary | ICD-10-CM | POA: Insufficient documentation

## 2019-05-15 DIAGNOSIS — Z992 Dependence on renal dialysis: Secondary | ICD-10-CM | POA: Insufficient documentation

## 2019-05-15 DIAGNOSIS — Z853 Personal history of malignant neoplasm of breast: Secondary | ICD-10-CM | POA: Insufficient documentation

## 2019-05-15 DIAGNOSIS — D6869 Other thrombophilia: Secondary | ICD-10-CM | POA: Diagnosis not present

## 2019-05-15 NOTE — Progress Notes (Addendum)
Primary Care Physician: Lucianne Lei, MD Referring Physician: [facility] ER f/u Cardiologist: Dr. Mattie Marlin Karen [name] is a 64 y.o. female with a h/o ESRD on dialysis, failed transplant in 2016, HTN in the past but not treated for HTN currently,that has been having a  spells of palpitations during dialysis. Thuis is mentioned in her chart back to 2017. She was seen in the ED 11/24/15 and 12 /14/18 for same. She converted in the ER after just a few mins after arrival, ekg appeared to be be flutter. Ekg seen form 11/24/2015 flutter vrs fib. She is on ASA for a chadsvasc score of 1. She is on metoprolol 25 mg at hs only because her BP drops with dialysis if she takes her am dose. She has only had the arrhythmia following with dialysis.  F/u in afib clinic from ER, 12/20, she was given prn 30 mg Cardizem to use during dialysis and she did use once during dialysis but did not seem to help. Her nephrologist did mention that she could try 1/2 tab of 30 mg Cardizem prior to starting dialysis.I did talk to Dr. Rayann Heman and he is willing to consider primary ablation. She does have normal heart function by echo in 2017.   She saw Dr.Allred early this year and was offered ablation but she declined. 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 Now that she is post op several weeks, her BP's are running low. When she saw the kidney surgeon a few days ago, he mentioned for her to come here and get  her meds adjusted. During this time, she has not noted any afib, it has been quiet.  F/u afib clinic 5/9, on lat visit , metoprolol was adjusted as  well as Cardizem. She reports no afib and BP has been running much better, not so low.  Feels improved. She is currently happy with management.  F/u in afib clinic, 8/8, she  unfortunately was diagnosed in June of this year with rt breast CA and is undergoing chemotherapy. She will be reassessed after chemo  for next step but pt thinks she will have a double mastectomy. She has not had any issues with afib.  F/U afib clinic 02/07/18. She is s/p right mastectomy, currently on chemotherapy. 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 She has not had any heart racing or palpitation symptoms. She continues to have issues with hypotension after dialysis.    F/u in afib clinic, 625/20. She is almost thru with chemo  for breast ca, 2 more treatments for which she is very happy about. She is not having any afib. She is having low blood pressure associated with dialysis. One of the nephrologists adjusted her dry weight this week to offset this. She is not symptomatic with her BP of 86/52 today. She is taking 30 mg of Cardizem after dialysis to ward off afib as she was having issues for awhile with afib right after dialysis.  F/u in afib clinic 4/29, at pt request. She started having intermittent mid chest discomfort with radiation into her throat  with a choking sensation   at will last several minutes at a time. She is very tired when the episode is over.  SHe will  have at least one of these spells daily.she  can have with rest or exertion. Not  associated with eating and will usually spontaneously  resolve. She saw Almyra Deforest,  [name], 4/2 with these symptoms. She  had a monitor and a stress test. The stress test was low risk. The monitor results are not back. She denies any irregularity to her heart rate or rhythm  with these episodes. She had a balloon to her rt arm fistula for low flow states 4/6 but had chest pain prior to that.   Today, she denies symptoms of palpitations, chest pain, shortness of breath, orthopnea, PND, lower extremity edema, dizziness, presyncope, syncope, or neurologic sequela. The patient is tolerating  medications without difficulties and is otherwise without complaint today.   Past Medical History:  Diagnosis Date  . Anemia   . Arthritis    knees  . Breast cancer ([place]) 2019   Right Breast Cancer  . Cancer ([place])   . Chest pain Jan 2016   low risk Myoview   . CHF (congestive heart failure) ([place])   . Complication of anesthesia 2005   difficulty remembering for a while and waking up  . Constipation   . Dysrhythmia    h/o A-Fib  . ESRD on dialysis [facility]) April 2016   MWF [place]  . GERD (gastroesophageal reflux disease)   . Heart murmur Nov 2015   Aortic scleosis- no stenosis  . Hemodialysis patient ([place])   . Hypertension   . Shortness of breath dyspnea    with exertion   Past Surgical History:  Procedure Laterality Date  . A/V FISTULAGRAM Right 04/22/2019   Procedure: A/V FISTULAGRAM - Right Upper;  Surgeon: Serafina Mitchell, MD;  Location: [place] [facility];  Service: Cardiovascular;  Laterality: Right;  . ABDOMINAL HYSTERECTOMY  2005  . AV FISTULA PLACEMENT Left 12/09/2013   Procedure: INSERTION OF ARTERIOVENOUS (AV) GORE-TEX GRAFT ARM;  Surgeon: Elam Dutch, MD;  Location: [facility] OR;  Service: Vascular;  Laterality: Left;  . AV FISTULA PLACEMENT Right 01/01/2018   Procedure: INSERTION OF ARTERIOVENOUS (AV) GORE-TEX GRAFT ARM RIGHT ARM;  Surgeon: Elam Dutch, MD;  Location: [place];  Service: Vascular;  Laterality: Right;  . AV FISTULA PLACEMENT Right 09/17/2018   Procedure: 1ST STAGE BASILIC ARTERIOVENOUS (AV) FISTULA CREATION RIGHT ARM;  Surgeon: Waynetta Sandy, MD;  Location: [place];  Service: Vascular;  Laterality: Right;  . BASCILIC VEIN TRANSPOSITION Right 11/12/2018   Procedure: SECOND STAGE BASILIC VEIN TRANSPOSITION RIGHT ARM;  Surgeon: Waynetta Sandy, MD;  Location: [place];  Service: Vascular;  Laterality: Right;  . BREAST BIOPSY  1990's  . BUNIONECTOMY Bilateral   . CHOLECYSTECTOMY  2005  . COLONOSCOPY    . IR AV DIALY SHUNT INTRO  NEEDLE/INTRACATH INITIAL W/PTA/IMG RIGHT Right 03/04/2019  . IR GENERIC HISTORICAL  01/11/2016   IR US GUIDE VASC ACCESS RIGHT 01/11/2016 Corrie Mckusick, DO [facility]  . IR GENERIC HISTORICAL  01/11/2016   IR RADIOLOGY PERIPHERAL GUIDED IV START 01/11/2016 Corrie Mckusick, DO [facility]  . IR IMAGING GUIDED PORT INSERTION  07/05/2017  . 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 KNEE ARTHROSCOPY Bilateral   . MASTECTOMY W/ SENTINEL NODE BIOPSY Right 11/20/2017  . MASTECTOMY W/ SENTINEL NODE BIOPSY Right 11/20/2017   Procedure: RIGHT MASTECTOMY WITH SENTINEL LYMPH NODE BIOPSY;  Surgeon: Coralie Keens, MD;  Location: [place];  Service: General;  Laterality: Right;  . PERIPHERAL VASCULAR CATHETERIZATION N/A  08/31/2015   Procedure: A/V Shuntogram;  Surgeon: Serafina Mitchell, MD;  Location: [place] [facility];  Service: Cardiovascular;  Laterality: N/A;  . PERIPHERAL VASCULAR CATHETERIZATION Left 08/31/2015   Procedure: Peripheral Vascular Balloon Angioplasty;  Surgeon: Serafina Mitchell, MD;  Location: [place] [facility];  Service: Cardiovascular;  Laterality: Left;  arm fistula  . PERIPHERAL VASCULAR INTERVENTION Right 04/22/2019   Procedure: PERIPHERAL VASCULAR INTERVENTION;  Surgeon: Serafina Mitchell, MD;  Location: [place] [facility];  Service: Cardiovascular;  Laterality: Right;  FISTULA  . PORT-A-CATH REMOVAL N/A 08/12/2018   Procedure: PORT-A-CATH REMOVAL;  Surgeon: Coralie Keens, MD;  Location: [place];  Service: General;  Laterality: N/A;  . ROTATOR CUFF REPAIR Right 1997  . UPPER EXTREMITY VENOGRAPHY Right 12/24/2017   Procedure: UPPER EXTREMITY VENOGRAPHY CENTRAL VENOGRAM;  Surgeon: Waynetta Sandy, MD;  Location: [place] [facility];  Service: Cardiovascular;  Laterality: Right;  . UPPER EXTREMITY VENOGRAPHY Bilateral 09/02/2018   Procedure: UPPER EXTREMITY VENOGRAPHY;  Surgeon: Waynetta Sandy, MD;  Location: [place] [facility];  Service: Cardiovascular;  Laterality:  Bilateral;    Current Outpatient Medications  Medication Sig Dispense Refill  . acetaminophen (TYLENOL) 500 MG tablet Take 1,000 mg by mouth daily as needed for mild pain, moderate pain or headache.     Marland Kitchen amoxicillin (AMOXIL) 500 MG capsule Take 2,000 mg by mouth See admin instructions. Take 4 capsules (2000 mg) by mouth one hour prior to dental procedures    . clobetasol cream (TEMOVATE) 1.61 % Apply 1 application topically daily as needed (for rash).     Marland Kitchen diltiazem (CARDIZEM) 30 MG tablet PLEASE SEE ATTACHED FOR DETAILED DIRECTIONS (Patient taking differently: Take 15 mg by mouth every Monday, Wednesday, and Friday. ) 45 tablet 3  . ethyl chloride spray Apply topically as needed (Dialysis).    Marland Kitchen HYDROcodone-acetaminophen (NORCO) 5-325 MG tablet Take 1 tablet by mouth every 6 (six) hours as needed for moderate pain. 20 tablet 0  . lidocaine-prilocaine (EMLA) cream Apply 1 application topically See admin instructions. Apply topically Monday, Wednesday and Friday before dialysis  6  . midodrine (PROAMATINE) 10 MG tablet Take 20 mg by mouth every Monday, Wednesday, and Friday.     . multivitamin (RENA-VIT) TABS tablet Take 1 tablet by mouth daily.     . ondansetron (ZOFRAN) 8 MG tablet Take 8 mg by mouth every 8 (eight) hours as needed for nausea or vomiting.    . pantoprazole (PROTONIX) 40 MG tablet TAKE 1 TABLET (40 MG TOTAL) BY MOUTH DAILY. (Patient taking differently: Take 40 mg by mouth daily. ) 90 tablet 1  . prochlorperazine (COMPAZINE) 10 MG tablet Take 10 mg by mouth every 6 (six) hours as needed (Nausea or vomiting).    . sevelamer carbonate (RENVELA) 800 MG tablet Take 1,600-2,400 mg by mouth See admin instructions. Take 2400 mg by mouth with each meal & 1 tablet 1600 mg by mouth with each snack.    . simvastatin (ZOCOR) 20 MG tablet TAKE 1 TABLET BY MOUTH EVERYDAY AT BEDTIME (Patient taking differently: Take 20 mg by mouth at bedtime. ) 90 tablet 2   No current facility-administered  medications for this encounter.    No Known Allergies  Social History   Socioeconomic History  . Marital status: Married    Spouse name: Not on file  . Number of children: 2  . Years of education: Not on file  . Highest education level: Not on file  Occupational History  .  Occupation: SHERIFF'S OFFICE    Employer: [employer]  Tobacco Use  . Smoking status: Never Smoker  . Smokeless tobacco: Never Used  Substance and Sexual Activity  . Alcohol use: No  . Drug use: No  . Sexual activity: Not on file    Comment: Hysterectomy  Other Topics Concern  . Not on file  Social History Narrative  . Not on file   Social Determinants of Health   Financial Resource Strain:   . Difficulty of Paying Living Expenses:   Food Insecurity:   . Worried About Charity fundraiser in the Last Year:   . Arboriculturist in the Last Year:   Transportation Needs:   . Film/video editor (Medical):   Marland Kitchen Lack of Transportation (Non-Medical):   Physical Activity:   . Days of Exercise per Week:   . Minutes of Exercise per Session:   Stress:   . Feeling of Stress :   Social Connections:   . Frequency of Communication with Friends and Family:   . Frequency of Social Gatherings with Friends and Family:   . Attends Religious Services:   . Active Member of Clubs or Organizations:   . Attends Archivist Meetings:   Marland Kitchen Marital Status:   Intimate Partner Violence:   . Fear of Current or Ex-Partner:   . Emotionally Abused:   Marland Kitchen Physically Abused:   . Sexually Abused:     Family History  Problem Relation Age of Onset  . Kidney disease Mother   . Heart attack Father   . Kidney disease Father   . Diabetes Sister   . Hyperlipidemia Sister   . Hypertension Sister   . Kidney disease Sister        x2    ROS- All systems are reviewed and negative except as per the HPI above  Physical Exam: Vitals:   05/15/19 1036  BP: 118/60  Pulse: 90  Weight: 88.5 kg  Height: 5\' 1"  (1.549  m)   Wt Readings from Last 3 Encounters:  05/15/19 88.5 kg  05/01/19 88.5 kg  04/22/19 88.5 kg    Labs: Lab Results  Component Value Date   NA 137 04/22/2019   K 5.1 04/22/2019   CL 97 (L) 04/22/2019   CO2 29 04/15/2019   GLUCOSE 95 04/22/2019   BUN 46 (H) 04/22/2019   CREATININE 8.40 (H) 04/22/2019   CALCIUM 9.1 04/15/2019   PHOS 3.5 01/08/2018   MG 2.1 11/24/2015   Lab Results  Component Value Date   INR 1.00 07/05/2017   Lab Results  Component Value Date   CHOL 165 06/08/2015   HDL 59 06/08/2015   LDLCALC 97 06/08/2015   TRIG 44 06/08/2015     GEN- The patient is well appearing, alert and oriented x 3 today.   HEENT-head normocephalic, atraumatic, sclera clear, conjunctiva pink, hearing intact, trachea midline. Lungs- Clear to ausculation bilaterally, normal work of breathing Heart- Regular rate and rhythm, no murmurs, rubs or gallops  GI- soft, NT, ND, + BS Extremities- no clubbing, cyanosis, or edema MS- no significant deformity or atrophy Skin- no rash or lesion Psych- euthymic mood, full affect Neuro- strength and sensation are intact   EKG- NSR at 90 bpm Zio patch pending Myoview-Study Highlights    The left ventricular ejection fraction is hyperdynamic (>65%).  Nuclear stress EF: 72%.  Blood pressure demonstrated a normal response to exercise.  There was no ST segment deviation noted during stress.  This  is a low risk study.     Epic records reviewed  Echo- 12/2017-Study Conclusions  - Left ventricle: The cavity size was normal. Systolic function was   normal. The estimated ejection fraction was in the range of 55%   to 60%. Wall motion was normal; there were no regional wall   motion abnormalities. Left ventricular diastolic function   parameters were normal. - Right atrium: Mobile calcified structur in RA not well visualized   may be calcified chiari network. - Atrial septum: No defect or patent foramen ovale was identified. -  Pulmonary arteries: PA peak pressure: 33 mm Hg (S).    Assessment and Plan: 1. Paroxysmal afib  Has been very quiet  zio patch pending  Pt has no  afib to report, very quiet for some time She would only be a candidate for amiodarone as far as AAD's 2/2 ESRD Offered ablation in the past but she declined  For now, anticoagulation not indicated for chadsvasc score of 1(female) Continue 1/2 of 30 mg  after dialysis. BP stable today.  2. Hypotension Related to dialysis, not symptomatic She is not on any meds that would contribute Stable today   3. Breast CA Has finished chemo   4. New onset  chest pain with radiation to throat Has typical and atypical features Stress test low risk but her episodes persist and she is very concerned She is pending an appointment with Dr. Gwenlyn Found 6/1 but I will ask to move up as she may need a f/u study to the stress test with her ongoing symptoms   Butch Penny C. Taiga Lupinacci, [facility] [address] [place], [place] [address] [phone]

## 2019-05-18 ENCOUNTER — Other Ambulatory Visit: Payer: Self-pay | Admitting: Cardiovascular Disease

## 2019-05-19 ENCOUNTER — Other Ambulatory Visit: Payer: Self-pay

## 2019-05-19 ENCOUNTER — Encounter: Payer: Self-pay | Admitting: General Practice

## 2019-05-19 ENCOUNTER — Ambulatory Visit (INDEPENDENT_AMBULATORY_CARE_PROVIDER_SITE_OTHER): Payer: Medicare Other | Admitting: General Practice

## 2019-05-19 VITALS — BP 128/76 | HR 78 | Ht 61.0 in | Wt 195.0 lb

## 2019-05-19 DIAGNOSIS — R079 Chest pain, unspecified: Secondary | ICD-10-CM | POA: Diagnosis not present

## 2019-05-19 DIAGNOSIS — I480 Paroxysmal atrial fibrillation: Secondary | ICD-10-CM | POA: Diagnosis not present

## 2019-05-19 DIAGNOSIS — N186 End stage renal disease: Secondary | ICD-10-CM | POA: Diagnosis not present

## 2019-05-19 DIAGNOSIS — Z992 Dependence on renal dialysis: Secondary | ICD-10-CM

## 2019-05-19 DIAGNOSIS — I10 Essential (primary) hypertension: Secondary | ICD-10-CM | POA: Diagnosis not present

## 2019-05-19 NOTE — Telephone Encounter (Signed)
Rx(s) sent to pharmacy electronically.  

## 2019-05-19 NOTE — Progress Notes (Signed)
Cardiology Clinic Note   Patient Name: Karen [name] Date of Encounter: 05/19/2019  Primary Care Provider:  Lucianne Lei, MD Primary Cardiologist:  Quay Burow, MD  Patient Profile    Karen [name] 64 year old female presents today for follow-up evaluation of her chest pain, and paroxysmal atrial fibrillation.  Past Medical History    Past Medical History:  Diagnosis Date  . Anemia   . Arthritis    knees  . Breast cancer ([place]) 2019   Right Breast Cancer  . Cancer ([place])   . Chest pain Jan 2016   low risk Myoview   . CHF (congestive heart failure) ([place])   . Complication of anesthesia 2005   difficulty remembering for a while and waking up  . Constipation   . Dysrhythmia    h/o A-Fib  . ESRD on dialysis [facility]) April 2016   MWF [place]  . GERD (gastroesophageal reflux disease)   . Heart murmur Nov 2015   Aortic scleosis- no stenosis  . Hemodialysis patient ([place])   . Hypertension   . Shortness of breath dyspnea    with exertion   Past Surgical History:  Procedure Laterality Date  . A/V FISTULAGRAM Right 04/22/2019   Procedure: A/V FISTULAGRAM - Right Upper;  Surgeon: Serafina Mitchell, MD;  Location: [place] [facility];  Service: Cardiovascular;  Laterality: Right;  . ABDOMINAL HYSTERECTOMY  2005  . AV FISTULA PLACEMENT Left 12/09/2013   Procedure: INSERTION OF ARTERIOVENOUS (AV) GORE-TEX GRAFT ARM;  Surgeon: Elam Dutch, MD;  Location: [facility] OR;  Service: Vascular;  Laterality: Left;  . AV FISTULA PLACEMENT Right 01/01/2018   Procedure: INSERTION OF ARTERIOVENOUS (AV) GORE-TEX GRAFT ARM RIGHT ARM;  Surgeon: Elam Dutch, MD;  Location: [place];  Service: Vascular;  Laterality: Right;  . AV FISTULA PLACEMENT Right 09/17/2018   Procedure: 1ST STAGE BASILIC ARTERIOVENOUS (AV) FISTULA CREATION RIGHT ARM;  Surgeon: Waynetta Sandy, MD;  Location: [place];  Service: Vascular;  Laterality: Right;  . BASCILIC VEIN TRANSPOSITION Right 11/12/2018   Procedure: SECOND STAGE BASILIC VEIN TRANSPOSITION RIGHT ARM;  Surgeon: Waynetta Sandy, MD;  Location: [place];  Service: Vascular;  Laterality: Right;  . BREAST BIOPSY  1990's  . BUNIONECTOMY Bilateral   . CHOLECYSTECTOMY  2005  . COLONOSCOPY    . IR AV DIALY SHUNT INTRO NEEDLE/INTRACATH INITIAL W/PTA/IMG RIGHT Right 03/04/2019  . IR GENERIC HISTORICAL  01/11/2016   IR US GUIDE VASC ACCESS RIGHT 01/11/2016 Corrie Mckusick, DO [facility]  . IR GENERIC HISTORICAL  01/11/2016   IR RADIOLOGY PERIPHERAL GUIDED IV START 01/11/2016 Corrie Mckusick, DO [facility]  . IR IMAGING GUIDED PORT INSERTION  07/05/2017  . 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 KNEE ARTHROSCOPY Bilateral   . MASTECTOMY W/ SENTINEL NODE BIOPSY Right 11/20/2017  . MASTECTOMY W/ SENTINEL NODE BIOPSY Right 11/20/2017   Procedure: RIGHT MASTECTOMY WITH SENTINEL LYMPH NODE BIOPSY;  Surgeon: Coralie Keens, MD;  Location: [place];  Service: General;  Laterality: Right;  . PERIPHERAL VASCULAR CATHETERIZATION N/A 08/31/2015   Procedure: A/V Shuntogram;  Surgeon: Serafina Mitchell, MD;  Location: [place] [facility];  Service: Cardiovascular;  Laterality: N/A;  . PERIPHERAL VASCULAR CATHETERIZATION Left 08/31/2015   Procedure: Peripheral Vascular Balloon Angioplasty;  Surgeon: Serafina Mitchell, MD;  Location: [place] [facility];  Service: Cardiovascular;  Laterality: Left;  arm fistula  . PERIPHERAL VASCULAR INTERVENTION Right 04/22/2019   Procedure: PERIPHERAL VASCULAR INTERVENTION;  Surgeon: Trula Slade,  Butch Penny, MD;  Location: [place] [facility];  Service: Cardiovascular;  Laterality: Right;  FISTULA  . PORT-A-CATH REMOVAL N/A 08/12/2018   Procedure: PORT-A-CATH REMOVAL;  Surgeon: Coralie Keens, MD;  Location: [place];  Service: General;  Laterality: N/A;  . ROTATOR CUFF REPAIR Right 1997  . UPPER EXTREMITY VENOGRAPHY Right 12/24/2017   Procedure: UPPER EXTREMITY VENOGRAPHY CENTRAL VENOGRAM;  Surgeon: Waynetta Sandy, MD;   Location: [place] [facility];  Service: Cardiovascular;  Laterality: Right;  . UPPER EXTREMITY VENOGRAPHY Bilateral 09/02/2018   Procedure: UPPER EXTREMITY VENOGRAPHY;  Surgeon: Waynetta Sandy, MD;  Location: [place] [facility];  Service: Cardiovascular;  Laterality: Bilateral;    Allergies  No Known Allergies  History of Present Illness    Ms. [name] has a past medical history of hypertension, paroxysmal atrial fibrillation, right breast cancer, heart murmur, GERD, end-stage renal disease on dialysis Monday Wednesday Friday.  She underwent renal transplant 7/16.  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  She was admitted to the hospital 12/19 with atypical chest pain.  CTA was negative for pulmonary embolism.  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  Her Cardizem was discontinued at that time.  She was seen by Dr. Gwenlyn Found 02/20/2019.  At that time she denied chest pain and shortness of breath.  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  On 04/22/2019 she underwent balloon venoplasty for her right basilic vein and she was noted to have early recurrence of her stenosis.  Surgical intervention was recommended.  She presented to see cardiology on 04/18/2019.  She complained of chest tightness with intermittent episodes that would last about 1 minute.  This was not with exertion.  A stress test as well as ZIO monitor were ordered.  She had a nuclear stress test on 05/01/2019 showed mild ST deviation 72% EF and low risk.  She presented to see Roderic Palau, NP on 05/15/2019.  This appointment was made at patient request due to mild chest discomfort with radiation into her throat and choking sensation.  She was noted to have at least 1 spells daily both at rest or with exertion.  They were not associated with eating and were noted to spontaneously resolve.  She had an appointment scheduled  with Dr. Gwenlyn Found on 06/17/2019.  However, due to presence of symptoms her appointment was scheduled for 05/19/2019.  At that time she denied palpitations, chest pain shortness of breath orthopnea, PND, lower extremity edema, dizziness.  Her cardiac monitor results showed a predominant underlying rhythm of sinus.  Rare SVT with the longest lasting 11 beats, rare PACs and rare PVCs.  No pauses, AV block, or atrial fibrillation.  She presents to the clinic today for follow-up evaluation and states she continues to have intermittent brief periods of throat pressure.  Her episodes last for around 1 minute and go away with rest.  She states she notices these episodes of pressure when she bends over and when she lays back.  When asked if her episodes are getting worse better or staying the same she indicates that they are about the same as they were at the beginning of April when she presented to the cardiology clinic.  I reviewed with her her nuclear stress test and her cardiac event monitor.  Her trigger episodes happen with sinus tachycardia.  Her episodes appear to be related to acid reflux and did  not appear to be cardiac in nature.  She has not noticed any of the symptoms surrounding her HD on Monday Wednesday Friday.  I will give her instructions for the GERD diet, have encouraged her to track her symptoms (time/duration, activity/inactivity, and when she is noticing them through the day).  I will have her keep her follow-up visit with Dr. Gwenlyn Found in 1 month.  Today she denies chest pain, shortness of breath, lower extremity edema, fatigue, palpitations, melena, hematuria, hemoptysis, diaphoresis, weakness, presyncope, syncope, orthopnea, and PND.   Home Medications    Prior to Admission medications   Medication Sig Start Date End Date Taking? Authorizing Provider  acetaminophen (TYLENOL) 500 MG tablet Take 1,000 mg by mouth daily as needed for mild pain, moderate pain or headache.     [provider]   amoxicillin (AMOXIL) 500 MG capsule Take 2,000 mg by mouth See admin instructions. Take 4 capsules (2000 mg) by mouth one hour prior to dental procedures    [provider]  clobetasol cream (TEMOVATE) 5.10 % Apply 1 application topically daily as needed (for rash).     [provider]  diltiazem (CARDIZEM) 30 MG tablet PLEASE SEE ATTACHED FOR DETAILED DIRECTIONS Patient taking differently: Take 15 mg by mouth every Monday, Wednesday, and Friday.  02/12/19   Deberah Pelton, NP  ethyl chloride spray Apply topically as needed (Dialysis).    [provider]  HYDROcodone-acetaminophen (NORCO) 5-325 MG tablet Take 1 tablet by mouth every 6 (six) hours as needed for moderate pain. 11/12/18   Dagoberto Ligas, PA-C  lidocaine-prilocaine (EMLA) cream Apply 1 application topically See admin instructions. Apply topically Monday, Wednesday and Friday before dialysis 05/26/15   [provider]  midodrine (PROAMATINE) 10 MG tablet Take 20 mg by mouth every Monday, Wednesday, and Friday.  10/16/18   [provider]  multivitamin (RENA-VIT) TABS tablet Take 1 tablet by mouth daily.     [provider]  ondansetron (ZOFRAN) 8 MG tablet Take 8 mg by mouth every 8 (eight) hours as needed for nausea or vomiting.    [provider]  pantoprazole (PROTONIX) 40 MG tablet TAKE 1 TABLET (40 MG TOTAL) BY MOUTH DAILY. Patient taking differently: Take 40 mg by mouth daily.  02/13/19   Lorretta Harp, MD  prochlorperazine (COMPAZINE) 10 MG tablet Take 10 mg by mouth every 6 (six) hours as needed (Nausea or vomiting). 08/21/18 09/10/20  Nicholas Lose, MD  sevelamer carbonate (RENVELA) 800 MG tablet Take 1,600-2,400 mg by mouth See admin instructions. Take 2400 mg by mouth with each meal & 1 tablet 1600 mg by mouth with each snack.    [provider]  simvastatin (ZOCOR) 20 MG tablet TAKE 1 TABLET BY MOUTH EVERYDAY AT BEDTIME Patient taking differently: Take 20  mg by mouth at bedtime.  07/15/18   Lorretta Harp, MD    Family History    Family History  Problem Relation Age of Onset  . Kidney disease Mother   . Heart attack Father   . Kidney disease Father   . Diabetes Sister   . Hyperlipidemia Sister   . Hypertension Sister   . Kidney disease Sister        x2   She indicated that her mother is deceased. She indicated that her father is deceased. She indicated that one of her four sisters is deceased. She indicated that her maternal grandmother is deceased. She indicated that her maternal grandfather is deceased.  Social History  Social History   Socioeconomic History  . Marital status: Married    Spouse name: Not on file  . Number of children: 2  . Years of education: Not on file  . Highest education level: Not on file  Occupational History  . Occupation: SHERIFF'S OFFICE    Employer: [employer]  Tobacco Use  . Smoking status: Never Smoker  . Smokeless tobacco: Never Used  Substance and Sexual Activity  . Alcohol use: No  . Drug use: No  . Sexual activity: Not on file    Comment: Hysterectomy  Other Topics Concern  . Not on file  Social History Narrative  . Not on file   Social Determinants of Health   Financial Resource Strain:   . Difficulty of Paying Living Expenses:   Food Insecurity:   . Worried About Charity fundraiser in the Last Year:   . Arboriculturist in the Last Year:   Transportation Needs:   . Film/video editor (Medical):   Marland Kitchen Lack of Transportation (Non-Medical):   Physical Activity:   . Days of Exercise per Week:   . Minutes of Exercise per Session:   Stress:   . Feeling of Stress :   Social Connections:   . Frequency of Communication with Friends and Family:   . Frequency of Social Gatherings with Friends and Family:   . Attends Religious Services:   . Active Member of Clubs or Organizations:   . Attends Archivist Meetings:   Marland Kitchen Marital Status:   Intimate Partner  Violence:   . Fear of Current or Ex-Partner:   . Emotionally Abused:   Marland Kitchen Physically Abused:   . Sexually Abused:      Review of Systems    General:  No chills, fever, night sweats or weight changes.  Cardiovascular:  No chest pain, dyspnea on exertion, edema, orthopnea, palpitations, paroxysmal nocturnal dyspnea. Dermatological: No rash, lesions/masses Respiratory: No cough, dyspnea Urologic: No hematuria, dysuria Abdominal:   No nausea, vomiting, diarrhea, bright red blood per rectum, melena, or hematemesis Neurologic:  No visual changes, wkns, changes in mental status. All other systems reviewed and are otherwise negative except as noted above.  Physical Exam    VS:  BP 128/76   Pulse 78   Ht 5\' 1"  (1.549 m)   Wt 195 lb (88.5 kg)   SpO2 99%   BMI 36.84 kg/m  , BMI Body mass index is 36.84 kg/m. GEN: Well nourished, well developed, in no acute distress. HEENT: normal. Neck: Supple, no JVD, carotid bruits, or masses. Cardiac: RRR, no murmurs, rubs, or gallops. No clubbing, cyanosis, edema.  Radials/DP/PT 2+ and equal bilaterally.  Respiratory:  Respirations regular and unlabored, clear to auscultation bilaterally. GI: Soft, nontender, nondistended, BS + x 4. MS: no deformity or atrophy. Skin: warm and dry, no rash. Neuro:  Strength and sensation are intact. Psych: Normal affect.  Accessory Clinical Findings    ECG personally reviewed by me today-none today.  EKG 05/15/2019 Normal sinus rhythm 90 bpm  Nuclear stress test 05/01/2019  The left ventricular ejection fraction is hyperdynamic (>65%).  Nuclear stress EF: 72%.  Blood pressure demonstrated a normal response to exercise.  There was no ST segment deviation noted during stress.  This is a low risk study.   Normal resting and stress perfusion. No ischemia or infarction EF 72%  Assessment & Plan   1.  Chest pain-no chest pain today.  Nuclear stress test 05/01/2019 showed normal rest  and stress perfusion,  no ischemia and EF of 72%. Zio monitor resultsa predominant underlying rhythm of sinus.  Rare SVT with the longest lasting 11 beats, rare PACs and rare PVCs.  No pauses, AV block, or atrial fibrillation.  Appears to be related to acid reflux and does not appear cardiac in nature.   Trial  a more strict GERD diet. Log symptoms, time, duration, activity/inactivity and bring to next appointment. Do not recommend cardiac catheterization/further ischemic evaluation at this time, may need in future for more definitive result.  Paroxysmal atrial fibrillation-heart rate today 78 bpm  Continue diltiazem as needed Avoid triggers caffeine, chocolate, EtOH etc. Heart healthy low-sodium diet Increase physical activity as tolerated  Essential hypertension-BP today 128/76.  Due to intermittent hypotension she requires midodrine during HD days. 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 Gwenlyn Found in 1 month.  Jossie Ng. Milbern Doescher NP-C    05/19/2019, 4:42 PM [facility] [address] Office [phone] Fax [phone]

## 2019-05-19 NOTE — Patient Instructions (Signed)
Medication Instructions:  Your physician recommends that you continue on your current medications as directed. Please refer to the Current Medication list given to you today.  *If you need a refill on your cardiac medications before your next appointment, please call your pharmacy*   Follow-Up: At [facility], you and your health needs are our priority.  As part of our continuing mission to provide you with exceptional heart care, we have created designated Provider Care Teams.  These Care Teams include your primary Cardiologist (physician) and Advanced Practice Providers (APPs -  Physician Assistants and Nurse Practitioners) who all work together to provide you with the care you need, when you need it.  We recommend signing up for the patient portal called "MyChart".  Sign up information is provided on this After Visit Summary.  MyChart is used to connect with patients for Virtual Visits (Telemedicine).  Patients are able to view lab/test results, encounter notes, upcoming appointments, etc.  Non-urgent messages can be sent to your provider as well.   To learn more about what you can do with MyChart, go to [email or web address].    Your next appointment:   June 1 @ 10:45am with Dr. Gwenlyn Found      Food Choices for Gastroesophageal Reflux Disease, Adult When you have gastroesophageal reflux disease (GERD), the foods you eat and your eating habits are very important. Choosing the right foods can help ease your discomfort. Think about working with a nutrition specialist (dietitian) to help you make good choices. What are tips for following this plan?  Meals  Choose healthy foods that are low in fat, such as fruits, vegetables, whole grains, low-fat dairy products, and lean meat, fish, and poultry.  Eat small meals often instead of 3 large meals a day. Eat your meals slowly, and in a place where you are relaxed. Avoid bending over or lying down until 2-3 hours after eating.  Avoid eating  meals 2-3 hours before bed.  Avoid drinking a lot of liquid with meals.  Cook foods using methods other than frying. Bake, grill, or broil food instead.  Avoid or limit: ? Chocolate. ? Peppermint or spearmint. ? Alcohol. ? Pepper. ? Black and decaffeinated coffee. ? Black and decaffeinated tea. ? Bubbly (carbonated) soft drinks. ? Caffeinated energy drinks and soft drinks.  Limit high-fat foods such as: ? Fatty meat or fried foods. ? Whole milk, cream, butter, or ice cream. ? Nuts and nut butters. ? Pastries, donuts, and sweets made with butter or shortening.  Avoid foods that cause symptoms. These foods may be different for everyone. Common foods that cause symptoms include: ? Tomatoes. ? Oranges, lemons, and limes. ? Peppers. ? Spicy food. ? Onions and garlic. ? Vinegar. Lifestyle  Maintain a healthy weight. Ask your doctor what weight is healthy for you. If you need to lose weight, work with your doctor to do so safely.  Exercise for at least 30 minutes for 5 or more days each week, or as told by your doctor.  Wear loose-fitting clothes.  Do not smoke. If you need help quitting, ask your doctor.  Sleep with the head of your bed higher than your feet. Use a wedge under the mattress or blocks under the bed frame to raise the head of the bed. Summary  When you have gastroesophageal reflux disease (GERD), food and lifestyle choices are very important in easing your symptoms.  Eat small meals often instead of 3 large meals a day. Eat your meals slowly, and in a  place where you are relaxed.  Limit high-fat foods such as fatty meat or fried foods.  Avoid bending over or lying down until 2-3 hours after eating.  Avoid peppermint and spearmint, caffeine, alcohol, and chocolate.

## 2019-05-20 ENCOUNTER — Other Ambulatory Visit: Payer: Self-pay | Admitting: Family Medicine

## 2019-05-20 ENCOUNTER — Encounter: Payer: Self-pay | Admitting: Internal Medicine

## 2019-05-20 DIAGNOSIS — Z1231 Encounter for screening mammogram for malignant neoplasm of breast: Secondary | ICD-10-CM

## 2019-05-27 ENCOUNTER — Other Ambulatory Visit: Payer: Self-pay | Admitting: *Deleted

## 2019-05-27 DIAGNOSIS — Z992 Dependence on renal dialysis: Secondary | ICD-10-CM

## 2019-05-27 DIAGNOSIS — N186 End stage renal disease: Secondary | ICD-10-CM

## 2019-05-27 DIAGNOSIS — T82858D Stenosis of vascular prosthetic devices, implants and grafts, subsequent encounter: Secondary | ICD-10-CM

## 2019-05-29 ENCOUNTER — Ambulatory Visit (HOSPITAL_COMMUNITY)
Admission: RE | Admit: 2019-05-29 | Discharge: 2019-05-29 | Disposition: A | Discharge status: Home / Self Care | Payer: Medicare Other | Source: Ambulatory Visit | Attending: Vascular Surgery | Admitting: Vascular Surgery

## 2019-05-29 ENCOUNTER — Ambulatory Visit (INDEPENDENT_AMBULATORY_CARE_PROVIDER_SITE_OTHER): Payer: Medicare Other | Admitting: Physician Assistant

## 2019-05-29 ENCOUNTER — Other Ambulatory Visit: Payer: Self-pay

## 2019-05-29 VITALS — BP 92/57 | HR 80 | Temp 97.2°F | Resp 14 | Ht 61.0 in | Wt 195.0 lb

## 2019-05-29 DIAGNOSIS — T82858D Stenosis of vascular prosthetic devices, implants and grafts, subsequent encounter: Secondary | ICD-10-CM | POA: Diagnosis not present

## 2019-05-29 DIAGNOSIS — Z992 Dependence on renal dialysis: Secondary | ICD-10-CM | POA: Diagnosis present

## 2019-05-29 DIAGNOSIS — N186 End stage renal disease: Secondary | ICD-10-CM | POA: Diagnosis present

## 2019-05-29 NOTE — H&P (View-Only) (Signed)
History of Present Illness:  Patient is a 64 y.o. year old female who presents for evaluation of her right UE fistula s/p Drug-coated balloon venoplasty, right basilic vein on 06/22/65 by Dr. Trula Slade.  She reports that her flow rates continue to be low while on HD.  No symptoms of steal.    Past Medical History:  Diagnosis Date  . Anemia   . Arthritis    knees  . Breast cancer ([place]) 2019   Right Breast Cancer  . Cancer ([place])   . Chest pain Jan 2016   low risk Myoview   . CHF (congestive heart failure) ([place])   . Complication of anesthesia 2005   difficulty remembering for a while and waking up  . Constipation   . Dysrhythmia    h/o A-Fib  . ESRD on dialysis [facility]) April 2016   MWF [place]  . GERD (gastroesophageal reflux disease)   . Heart murmur Nov 2015   Aortic scleosis- no stenosis  . Hemodialysis patient ([place])   . Hypertension   . Shortness of breath dyspnea    with exertion    Past Surgical History:  Procedure Laterality Date  . A/V FISTULAGRAM Right 04/22/2019   Procedure: A/V FISTULAGRAM - Right Upper;  Surgeon: Serafina Mitchell, MD;  Location: [place] CV LAB;  Service: Cardiovascular;  Laterality: Right;  . ABDOMINAL HYSTERECTOMY  2005  . AV FISTULA PLACEMENT Left 12/09/2013   Procedure: INSERTION OF ARTERIOVENOUS (AV) GORE-TEX GRAFT ARM;  Surgeon: Elam Dutch, MD;  Location: [facility] OR;  Service: Vascular;  Laterality: Left;  . AV FISTULA PLACEMENT Right 01/01/2018   Procedure: INSERTION OF ARTERIOVENOUS (AV) GORE-TEX GRAFT ARM RIGHT ARM;  Surgeon: Elam Dutch, MD;  Location: [place];  Service: Vascular;  Laterality: Right;  . AV FISTULA PLACEMENT Right 09/17/2018   Procedure: 1ST STAGE BASILIC ARTERIOVENOUS (AV) FISTULA CREATION RIGHT ARM;  Surgeon: Waynetta Sandy, MD;  Location: [place];  Service: Vascular;  Laterality: Right;  . BASCILIC VEIN TRANSPOSITION Right 11/12/2018   Procedure: SECOND STAGE BASILIC VEIN TRANSPOSITION RIGHT ARM;   Surgeon: Waynetta Sandy, MD;  Location: [place];  Service: Vascular;  Laterality: Right;  . BREAST BIOPSY  1990's  . BUNIONECTOMY Bilateral   . CHOLECYSTECTOMY  2005  . COLONOSCOPY    . IR AV DIALY SHUNT INTRO NEEDLE/INTRACATH INITIAL W/PTA/IMG RIGHT Right 03/04/2019  . IR GENERIC HISTORICAL  01/11/2016   IR US GUIDE VASC ACCESS RIGHT 01/11/2016 Corrie Mckusick, DO [facility]-INTERV RAD  . IR GENERIC HISTORICAL  01/11/2016   IR RADIOLOGY PERIPHERAL GUIDED IV START 01/11/2016 Corrie Mckusick, DO [facility]-INTER[facility]  . IR IMAGING GUIDED PORT INSERTION  07/05/2017  . 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 KNEE ARTHROSCOPY Bilateral   . MASTECTOMY W/ SENTINEL NODE BIOPSY Right 11/20/2017  . MASTECTOMY W/ SENTINEL NODE BIOPSY Right 11/20/2017   Procedure: RIGHT MASTECTOMY WITH SENTINEL LYMPH NODE BIOPSY;  Surgeon: Coralie Keens, MD;  Location: [place];  Service: General;  Laterality: Right;  . PERIPHERAL VASCULAR CATHETERIZATION N/A 08/31/2015   Procedure: A/V Shuntogram;  Surgeon: Serafina Mitchell, MD;  Location: [place] [facility] LAB;  Service: Cardiovascular;  Laterality: N/A;  . PERIPHERAL VASCULAR CATHETERIZATION Left 08/31/2015   Procedure: Peripheral Vascular Balloon Angioplasty;  Surgeon: Serafina Mitchell, MD;  Location: [place] CV LAB;  Service: Cardiovascular;  Laterality: Left;  arm fistula  . PERIPHERAL VASCULAR INTERVENTION Right 04/22/2019   Procedure: PERIPHERAL VASCULAR INTERVENTION;  Surgeon: Serafina Mitchell, MD;  Location: [place] [facility] LAB;  Service: Cardiovascular;  Laterality: Right;  FISTULA  . PORT-A-CATH REMOVAL N/A 08/12/2018   Procedure: PORT-A-CATH REMOVAL;  Surgeon: Coralie Keens, MD;  Location: [place];  Service: General;  Laterality: N/A;  . ROTATOR CUFF REPAIR Right 1997  . UPPER EXTREMITY VENOGRAPHY Right 12/24/2017   Procedure: UPPER EXTREMITY VENOGRAPHY CENTRAL VENOGRAM;  Surgeon: Waynetta Sandy, MD;  Location: [place] CV LAB;  Service: Cardiovascular;   Laterality: Right;  . UPPER EXTREMITY VENOGRAPHY Bilateral 09/02/2018   Procedure: UPPER EXTREMITY VENOGRAPHY;  Surgeon: Waynetta Sandy, MD;  Location: [place] CV LAB;  Service: Cardiovascular;  Laterality: Bilateral;     Social History Social History   Tobacco Use  . Smoking status: Never Smoker  . Smokeless tobacco: Never Used  Substance Use Topics  . Alcohol use: No  . Drug use: No    Family History Family History  Problem Relation Age of Onset  . Kidney disease Mother   . Heart attack Father   . Kidney disease Father   . Diabetes Sister   . Hyperlipidemia Sister   . Hypertension Sister   . Kidney disease Sister        x2    Allergies  No Known Allergies   Current Outpatient Medications  Medication Sig Dispense Refill  . acetaminophen (TYLENOL) 500 MG tablet Take 1,000 mg by mouth daily as needed for mild pain, moderate pain or headache.     . clobetasol cream (TEMOVATE) 1.70 % Apply 1 application topically daily as needed (for rash).     Marland Kitchen diltiazem (CARDIZEM) 30 MG tablet PLEASE SEE ATTACHED FOR DETAILED DIRECTIONS (Patient taking differently: Take 15 mg by mouth every Monday, Wednesday, and Friday. ) 45 tablet 3  . doxercalciferol (HECTOROL) 0.5 MCG capsule Doxercalciferol (Hectorol)    . ethyl chloride spray Apply topically as needed (Dialysis).    Marland Kitchen lidocaine-prilocaine (EMLA) cream Apply 1 application topically See admin instructions. Apply topically Monday, Wednesday and Friday before dialysis  6  . midodrine (PROAMATINE) 10 MG tablet Take 20 mg by mouth every Monday, Wednesday, and Friday.     . multivitamin (RENA-VIT) TABS tablet Take 1 tablet by mouth daily.     . ondansetron (ZOFRAN) 8 MG tablet Take 8 mg by mouth every 8 (eight) hours as needed for nausea or vomiting.    . pantoprazole (PROTONIX) 40 MG tablet TAKE 1 TABLET (40 MG TOTAL) BY MOUTH DAILY. (Patient taking differently: Take 40 mg by mouth daily. ) 90 tablet 1  . prochlorperazine  (COMPAZINE) 10 MG tablet Take 10 mg by mouth every 6 (six) hours as needed (Nausea or vomiting).    . sevelamer carbonate (RENVELA) 800 MG tablet Take 1,600-2,400 mg by mouth See admin instructions. Take 2400 mg by mouth with each meal & 1 tablet 1600 mg by mouth with each snack.    . simvastatin (ZOCOR) 20 MG tablet TAKE 1 TABLET BY MOUTH EVERYDAY AT BEDTIME 90 tablet 3  . amoxicillin (AMOXIL) 500 MG capsule Take 2,000 mg by mouth See admin instructions. Take 4 capsules (2000 mg) by mouth one hour prior to dental procedures    . HYDROcodone-acetaminophen (NORCO) 5-325 MG tablet Take 1 tablet by mouth every 6 (six) hours as needed for moderate pain. (Patient not taking: Reported on 05/29/2019) 20 tablet 0   No current facility-administered medications for this visit.    ROS:   General:  No weight loss, Fever, chills  HEENT: No recent headaches, no nasal bleeding, no visual changes, no sore throat  Neurologic: No dizziness, blackouts, seizures. No recent symptoms of stroke or mini- stroke. No recent episodes of slurred speech, or temporary blindness.  Cardiac: No recent episodes of chest pain/pressure, no shortness of breath at rest.  No shortness of breath with exertion.  Denies history of atrial fibrillation or irregular heartbeat  Vascular: No history of rest pain in feet.  No history of claudication.  No history of non-healing ulcer, No history of DVT   Pulmonary: No home oxygen, no productive cough, no hemoptysis,  No asthma or wheezing  Musculoskeletal:  [ ]  Arthritis, [ ]  Low back pain,  [ ]  Joint pain  Hematologic:No history of hypercoagulable state.  No history of easy bleeding.  No history of anemia  Gastrointestinal: No hematochezia or melena,  No gastroesophageal reflux, no trouble swallowing  Urinary: [ ]  chronic Kidney disease, [x ] on HD - [ x] MWF or [ ]  TTHS, [ ]  Burning with urination, [ ]  Frequent urination, [ ]  Difficulty urinating;   Skin: No rashes  Psychological:  No history of anxiety,  No history of depression   Physical Examination  Vitals:   05/29/19 0947  BP: (!) 92/57  Pulse: 80  Resp: 14  Temp: (!) 97.2 F (36.2 C)  TempSrc: Temporal  SpO2: 100%  Weight: 195 lb (88.5 kg)  Height: 5\' 1"  (1.549 m)    Body mass index is 36.84 kg/m.  General:  Alert and oriented, no acute distress HEENT: Normal Neck: No bruit or JVD Pulmonary: Clear to auscultation bilaterally Cardiac: Regular Rate and Rhythm without murmur Gastrointestinal: Soft, non-tender, non-distended, no mass, no scars Skin: No rash Extremity Pulses:  2+ radial, brachial pulses bilaterally Musculoskeletal: No deformity or edema  Neurologic: Upper and lower extremity motor 5/5 and symmetric  DATA:    Findings:  +--------------------+----------+-----------------+--------+  AVF         PSV (cm/s)Flow Vol (mL/min)Comments  +--------------------+----------+-----------------+--------+  Native artery inflow  105      781          +--------------------+----------+-----------------+--------+  AVF Anastomosis     285                 +--------------------+----------+-----------------+--------+     +------------+---------+------------+----------+---------------------------  ----+  OUTFLOW VEIN  PSV   Diameter Depth (cm)      Describe                (cm/s)   (cm)                         +------------+---------+------------+----------+---------------------------  ----+  Prox UA     480    0.40    2.33   change in Diameter,  audible                                bruit         +------------+---------+------------+----------+---------------------------  ----+  Prox UA     79    0.78    0.38                      +------------+---------+------------+----------+---------------------------  ----+  Mid UA     67    0.83    0.28                    +------------+---------+------------+----------+---------------------------  ----+  Dist UA     700    0.35    1.11     change in Diameter      +------------+---------+------------+----------+---------------------------  ----+  AC Fossa    271    0.47    1.09                    +------------+---------+------------+----------+---------------------------  ----+        Summary:  Patent right Basilic vein transposition with increased velocity in the  distal  upper arm at area of diameter change with decreased flow proximally . At  the  proximal upper arm near the confluence there is a slight increase of  velocity  with an audible bruit at area of diameter change.   ASSESSMENT:  ESRD with anastomosis stenosis audible bruit and flow rate of 700 cm/sec with diameter of 0.35, proximal flow rate of 480 cm/s and diameter of 0.40.  PLAN: Dr. Trula Slade suggested after the fistulogram and venoplasty that if the flow did not improve with HD use of the fistula she would benefit from surgical intervention.  I have scheduled her for surgical intervention and revision of her right UE fistula with stenosis.   She has a functioning left TDC to use until we get the right UE BB fistula functioning fully.  Roxy Horseman PA-C [facility] [place] Office: [phone]  MD in clinic [place]

## 2019-05-29 NOTE — Progress Notes (Signed)
History of Present Illness:  Patient is a 64 y.o. year old female who presents for evaluation of her right UE fistula s/p Drug-coated balloon venoplasty, right basilic vein on 0/1/09 by Dr. Trula Slade.  She reports that her flow rates continue to be low while on HD.  No symptoms of steal.    Past Medical History:  Diagnosis Date  . Anemia   . Arthritis    knees  . Breast cancer ([place]) 2019   Right Breast Cancer  . Cancer ([place])   . Chest pain Jan 2016   low risk Myoview   . CHF (congestive heart failure) ([place])   . Complication of anesthesia 2005   difficulty remembering for a while and waking up  . Constipation   . Dysrhythmia    h/o A-Fib  . ESRD on dialysis [facility]) April 2016   MWF [place]  . GERD (gastroesophageal reflux disease)   . Heart murmur Nov 2015   Aortic scleosis- no stenosis  . Hemodialysis patient ([place])   . Hypertension   . Shortness of breath dyspnea    with exertion    Past Surgical History:  Procedure Laterality Date  . A/V FISTULAGRAM Right 04/22/2019   Procedure: A/V FISTULAGRAM - Right Upper;  Surgeon: Serafina Mitchell, MD;  Location: [place] [facility];  Service: Cardiovascular;  Laterality: Right;  . ABDOMINAL HYSTERECTOMY  2005  . AV FISTULA PLACEMENT Left 12/09/2013   Procedure: INSERTION OF ARTERIOVENOUS (AV) GORE-TEX GRAFT ARM;  Surgeon: Elam Dutch, MD;  Location: [facility] OR;  Service: Vascular;  Laterality: Left;  . AV FISTULA PLACEMENT Right 01/01/2018   Procedure: INSERTION OF ARTERIOVENOUS (AV) GORE-TEX GRAFT ARM RIGHT ARM;  Surgeon: Elam Dutch, MD;  Location: [place];  Service: Vascular;  Laterality: Right;  . AV FISTULA PLACEMENT Right 09/17/2018   Procedure: 1ST STAGE BASILIC ARTERIOVENOUS (AV) FISTULA CREATION RIGHT ARM;  Surgeon: Waynetta Sandy, MD;  Location: [place];  Service: Vascular;  Laterality: Right;  . BASCILIC VEIN TRANSPOSITION Right 11/12/2018   Procedure: SECOND STAGE BASILIC VEIN TRANSPOSITION RIGHT ARM;   Surgeon: Waynetta Sandy, MD;  Location: [place];  Service: Vascular;  Laterality: Right;  . BREAST BIOPSY  1990's  . BUNIONECTOMY Bilateral   . CHOLECYSTECTOMY  2005  . COLONOSCOPY    . IR AV DIALY SHUNT INTRO NEEDLE/INTRACATH INITIAL W/PTA/IMG RIGHT Right 03/04/2019  . IR GENERIC HISTORICAL  01/11/2016   IR US GUIDE VASC ACCESS RIGHT 01/11/2016 Corrie Mckusick, DO [facility]-INTER[facility] RAD  . IR GENERIC HISTORICAL  01/11/2016   IR RADIOLOGY PERIPHERAL GUIDED IV START 01/11/2016 Corrie Mckusick, DO [facility]-INTER[facility] RAD  . IR IMAGING GUIDED PORT INSERTION  07/05/2017  . 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 KNEE ARTHROSCOPY Bilateral   . MASTECTOMY W/ SENTINEL NODE BIOPSY Right 11/20/2017  . MASTECTOMY W/ SENTINEL NODE BIOPSY Right 11/20/2017   Procedure: RIGHT MASTECTOMY WITH SENTINEL LYMPH NODE BIOPSY;  Surgeon: Coralie Keens, MD;  Location: [place];  Service: General;  Laterality: Right;  . PERIPHERAL VASCULAR CATHETERIZATION N/A 08/31/2015   Procedure: A/V Shuntogram;  Surgeon: Serafina Mitchell, MD;  Location: [place] CV LAB;  Service: Cardiovascular;  Laterality: N/A;  . PERIPHERAL VASCULAR CATHETERIZATION Left 08/31/2015   Procedure: Peripheral Vascular Balloon Angioplasty;  Surgeon: Serafina Mitchell, MD;  Location: [place] [facility];  Service: Cardiovascular;  Laterality: Left;  arm fistula  . PERIPHERAL VASCULAR INTERVENTION Right 04/22/2019   Procedure: PERIPHERAL VASCULAR INTERVENTION;  Surgeon: Serafina Mitchell, MD;  Location: [place] [facility] LAB;  Service: Cardiovascular;  Laterality: Right;  FISTULA  . PORT-A-CATH REMOVAL N/A 08/12/2018   Procedure: PORT-A-CATH REMOVAL;  Surgeon: Coralie Keens, MD;  Location: [place];  Service: General;  Laterality: N/A;  . ROTATOR CUFF REPAIR Right 1997  . UPPER EXTREMITY VENOGRAPHY Right 12/24/2017   Procedure: UPPER EXTREMITY VENOGRAPHY CENTRAL VENOGRAM;  Surgeon: Waynetta Sandy, MD;  Location: [place] [facility];  Service: Cardiovascular;   Laterality: Right;  . UPPER EXTREMITY VENOGRAPHY Bilateral 09/02/2018   Procedure: UPPER EXTREMITY VENOGRAPHY;  Surgeon: Waynetta Sandy, MD;  Location: [place] [facility] LAB;  Service: Cardiovascular;  Laterality: Bilateral;     Social History Social History   Tobacco Use  . Smoking status: Never Smoker  . Smokeless tobacco: Never Used  Substance Use Topics  . Alcohol use: No  . Drug use: No    Family History Family History  Problem Relation Age of Onset  . Kidney disease Mother   . Heart attack Father   . Kidney disease Father   . Diabetes Sister   . Hyperlipidemia Sister   . Hypertension Sister   . Kidney disease Sister        x2    Allergies  No Known Allergies   Current Outpatient Medications  Medication Sig Dispense Refill  . acetaminophen (TYLENOL) 500 MG tablet Take 1,000 mg by mouth daily as needed for mild pain, moderate pain or headache.     . clobetasol cream (TEMOVATE) 2.11 % Apply 1 application topically daily as needed (for rash).     Marland Kitchen diltiazem (CARDIZEM) 30 MG tablet PLEASE SEE ATTACHED FOR DETAILED DIRECTIONS (Patient taking differently: Take 15 mg by mouth every Monday, Wednesday, and Friday. ) 45 tablet 3  . doxercalciferol (HECTOROL) 0.5 MCG capsule Doxercalciferol (Hectorol)    . ethyl chloride spray Apply topically as needed (Dialysis).    Marland Kitchen lidocaine-prilocaine (EMLA) cream Apply 1 application topically See admin instructions. Apply topically Monday, Wednesday and Friday before dialysis  6  . midodrine (PROAMATINE) 10 MG tablet Take 20 mg by mouth every Monday, Wednesday, and Friday.     . multivitamin (RENA-VIT) TABS tablet Take 1 tablet by mouth daily.     . ondansetron (ZOFRAN) 8 MG tablet Take 8 mg by mouth every 8 (eight) hours as needed for nausea or vomiting.    . pantoprazole (PROTONIX) 40 MG tablet TAKE 1 TABLET (40 MG TOTAL) BY MOUTH DAILY. (Patient taking differently: Take 40 mg by mouth daily. ) 90 tablet 1  . prochlorperazine  (COMPAZINE) 10 MG tablet Take 10 mg by mouth every 6 (six) hours as needed (Nausea or vomiting).    . sevelamer carbonate (RENVELA) 800 MG tablet Take 1,600-2,400 mg by mouth See admin instructions. Take 2400 mg by mouth with each meal & 1 tablet 1600 mg by mouth with each snack.    . simvastatin (ZOCOR) 20 MG tablet TAKE 1 TABLET BY MOUTH EVERYDAY AT BEDTIME 90 tablet 3  . amoxicillin (AMOXIL) 500 MG capsule Take 2,000 mg by mouth See admin instructions. Take 4 capsules (2000 mg) by mouth one hour prior to dental procedures    . HYDROcodone-acetaminophen (NORCO) 5-325 MG tablet Take 1 tablet by mouth every 6 (six) hours as needed for moderate pain. (Patient not taking: Reported on 05/29/2019) 20 tablet 0   No current facility-administered medications for this visit.    ROS:   General:  No weight loss, Fever, chills  HEENT: No recent headaches, no nasal bleeding, no visual changes, no sore throat  Neurologic: No dizziness, blackouts, seizures. No recent symptoms of stroke or mini- stroke. No recent episodes of slurred speech, or temporary blindness.  Cardiac: No recent episodes of chest pain/pressure, no shortness of breath at rest.  No shortness of breath with exertion.  Denies history of atrial fibrillation or irregular heartbeat  Vascular: No history of rest pain in feet.  No history of claudication.  No history of non-healing ulcer, No history of DVT   Pulmonary: No home oxygen, no productive cough, no hemoptysis,  No asthma or wheezing  Musculoskeletal:  [ ]  Arthritis, [ ]  Low back pain,  [ ]  Joint pain  Hematologic:No history of hypercoagulable state.  No history of easy bleeding.  No history of anemia  Gastrointestinal: No hematochezia or melena,  No gastroesophageal reflux, no trouble swallowing  Urinary: [ ]  chronic Kidney disease, [x ] on HD - [ x] MWF or [ ]  TTHS, [ ]  Burning with urination, [ ]  Frequent urination, [ ]  Difficulty urinating;   Skin: No rashes  Psychological:  No history of anxiety,  No history of depression   Physical Examination  Vitals:   05/29/19 0947  BP: (!) 92/57  Pulse: 80  Resp: 14  Temp: (!) 97.2 F (36.2 C)  TempSrc: Temporal  SpO2: 100%  Weight: 195 lb (88.5 kg)  Height: 5\' 1"  (1.549 m)    Body mass index is 36.84 kg/m.  General:  Alert and oriented, no acute distress HEENT: Normal Neck: No bruit or JVD Pulmonary: Clear to auscultation bilaterally Cardiac: Regular Rate and Rhythm without murmur Gastrointestinal: Soft, non-tender, non-distended, no mass, no scars Skin: No rash Extremity Pulses:  2+ radial, brachial pulses bilaterally Musculoskeletal: No deformity or edema  Neurologic: Upper and lower extremity motor 5/5 and symmetric  DATA:    Findings:  +--------------------+----------+-----------------+--------+  AVF         PSV (cm/s)Flow Vol (mL/min)Comments  +--------------------+----------+-----------------+--------+  Native artery inflow  105      781          +--------------------+----------+-----------------+--------+  AVF Anastomosis     285                 +--------------------+----------+-----------------+--------+     +------------+---------+------------+----------+---------------------------  ----+  OUTFLOW VEIN  PSV   Diameter Depth (cm)      Describe                (cm/s)   (cm)                         +------------+---------+------------+----------+---------------------------  ----+  Prox UA     480    0.40    2.33   change in Diameter,  audible                                bruit         +------------+---------+------------+----------+---------------------------  ----+  Prox UA     79    0.78    0.38                      +------------+---------+------------+----------+---------------------------  ----+  Mid UA     67    0.83    0.28                    +------------+---------+------------+----------+---------------------------  ----+  Dist UA     700    0.35    1.11     change in Diameter      +------------+---------+------------+----------+---------------------------  ----+  AC Fossa    271    0.47    1.09                    +------------+---------+------------+----------+---------------------------  ----+        Summary:  Patent right Basilic vein transposition with increased velocity in the  distal  upper arm at area of diameter change with decreased flow proximally . At  the  proximal upper arm near the confluence there is a slight increase of  velocity  with an audible bruit at area of diameter change.   ASSESSMENT:  ESRD with anastomosis stenosis audible bruit and flow rate of 700 cm/sec with diameter of 0.35, proximal flow rate of 480 cm/s and diameter of 0.40.  PLAN: Dr. Trula Slade suggested after the fistulogram and venoplasty that if the flow did not improve with HD use of the fistula she would benefit from surgical intervention.  I have scheduled her for surgical intervention and revision of her right UE fistula with stenosis.   She has a functioning left TDC to use until we get the right UE BB fistula functioning fully.  Roxy Horseman PA-C [facility] [place] Office: [phone]  MD in clinic [place]

## 2019-05-30 ENCOUNTER — Other Ambulatory Visit: Payer: Self-pay

## 2019-06-10 ENCOUNTER — Other Ambulatory Visit (HOSPITAL_COMMUNITY)
Admission: RE | Admit: 2019-06-10 | Discharge: 2019-06-10 | Disposition: A | Discharge status: Home / Self Care | Payer: Medicare Other | Source: Ambulatory Visit | Attending: Surgery | Admitting: Surgery

## 2019-06-10 ENCOUNTER — Encounter (HOSPITAL_COMMUNITY): Payer: Self-pay | Admitting: Surgery

## 2019-06-10 ENCOUNTER — Other Ambulatory Visit: Payer: Self-pay

## 2019-06-10 DIAGNOSIS — Z20822 Contact with and (suspected) exposure to covid-19: Secondary | ICD-10-CM | POA: Insufficient documentation

## 2019-06-10 DIAGNOSIS — Z01812 Encounter for preprocedural laboratory examination: Secondary | ICD-10-CM | POA: Insufficient documentation

## 2019-06-10 LAB — SARS CORONAVIRUS 2 (TAT 6-24 HRS): SARS Coronavirus 2: NEGATIVE

## 2019-06-10 NOTE — Progress Notes (Signed)
Spoke with pt for pre-op call. Pt has hx of CHF, no recent episodes. Hx of A-fib, states she feels it come and go. Pt is on Diltiazem, pt sees Dr. Gwenlyn Found and last office visit was 04/18/19. She has had recent Echo, EKG and Stress test. Pt states she is not diabetic.  Covid test done today. Result pending. Pt states she has been in quarantine since test was done and will stay in it until she comes for surgery on Thursday. (except to go to Dialysis tomorrow).

## 2019-06-12 ENCOUNTER — Ambulatory Visit (HOSPITAL_COMMUNITY): Payer: Medicare Other | Admitting: Certified Registered"

## 2019-06-12 ENCOUNTER — Ambulatory Visit (HOSPITAL_COMMUNITY)
Admission: RE | Admit: 2019-06-12 | Discharge: 2019-06-12 | Disposition: A | Discharge status: Home / Self Care | Payer: Medicare Other | Attending: Surgery | Admitting: Surgery

## 2019-06-12 ENCOUNTER — Ambulatory Visit (HOSPITAL_COMMUNITY): Payer: Medicare Other

## 2019-06-12 ENCOUNTER — Encounter (HOSPITAL_COMMUNITY)
Admission: RE | Disposition: A | Discharge status: Home / Self Care | Payer: Self-pay | Source: Home / Self Care | Attending: Surgery

## 2019-06-12 ENCOUNTER — Encounter (HOSPITAL_COMMUNITY): Payer: Self-pay | Admitting: Surgery

## 2019-06-12 ENCOUNTER — Other Ambulatory Visit: Payer: Self-pay

## 2019-06-12 DIAGNOSIS — Z853 Personal history of malignant neoplasm of breast: Secondary | ICD-10-CM | POA: Insufficient documentation

## 2019-06-12 DIAGNOSIS — I132 Hypertensive heart and chronic kidney disease with heart failure and with stage 5 chronic kidney disease, or end stage renal disease: Secondary | ICD-10-CM | POA: Insufficient documentation

## 2019-06-12 DIAGNOSIS — M170 Bilateral primary osteoarthritis of knee: Secondary | ICD-10-CM | POA: Insufficient documentation

## 2019-06-12 DIAGNOSIS — T82590A Other mechanical complication of surgically created arteriovenous fistula, initial encounter: Secondary | ICD-10-CM | POA: Diagnosis present

## 2019-06-12 DIAGNOSIS — Z940 Kidney transplant status: Secondary | ICD-10-CM | POA: Insufficient documentation

## 2019-06-12 DIAGNOSIS — K219 Gastro-esophageal reflux disease without esophagitis: Secondary | ICD-10-CM | POA: Diagnosis not present

## 2019-06-12 DIAGNOSIS — Y832 Surgical operation with anastomosis, bypass or graft as the cause of abnormal reaction of the patient, or of later complication, without mention of misadventure at the time of the procedure: Secondary | ICD-10-CM | POA: Diagnosis not present

## 2019-06-12 DIAGNOSIS — Z6836 Body mass index (BMI) 36.0-36.9, adult: Secondary | ICD-10-CM | POA: Insufficient documentation

## 2019-06-12 DIAGNOSIS — T82858A Stenosis of vascular prosthetic devices, implants and grafts, initial encounter: Secondary | ICD-10-CM | POA: Diagnosis not present

## 2019-06-12 DIAGNOSIS — N186 End stage renal disease: Secondary | ICD-10-CM

## 2019-06-12 DIAGNOSIS — I509 Heart failure, unspecified: Secondary | ICD-10-CM | POA: Insufficient documentation

## 2019-06-12 DIAGNOSIS — I4891 Unspecified atrial fibrillation: Secondary | ICD-10-CM | POA: Insufficient documentation

## 2019-06-12 DIAGNOSIS — Z992 Dependence on renal dialysis: Secondary | ICD-10-CM

## 2019-06-12 DIAGNOSIS — T82898A Other specified complication of vascular prosthetic devices, implants and grafts, initial encounter: Secondary | ICD-10-CM

## 2019-06-12 DIAGNOSIS — Z8249 Family history of ischemic heart disease and other diseases of the circulatory system: Secondary | ICD-10-CM | POA: Diagnosis not present

## 2019-06-12 DIAGNOSIS — Z79899 Other long term (current) drug therapy: Secondary | ICD-10-CM | POA: Diagnosis not present

## 2019-06-12 HISTORY — PX: FISTULOGRAM: SHX5832

## 2019-06-12 HISTORY — PX: INSERTION OF ILIAC STENT: SHX6256

## 2019-06-12 LAB — POCT I-STAT, CHEM 8
BUN: 35 mg/dL — ABNORMAL HIGH (ref 8–23)
Calcium, Ion: 1.15 mmol/L (ref 1.15–1.40)
Chloride: 96 mmol/L — ABNORMAL LOW (ref 98–111)
Creatinine, Ser: 8.2 mg/dL — ABNORMAL HIGH (ref 0.44–1.00)
Glucose, Bld: 88 mg/dL (ref 70–99)
HCT: 40 % (ref 36.0–46.0)
Hemoglobin: 13.6 g/dL (ref 12.0–15.0)
Potassium: 4.6 mmol/L (ref 3.5–5.1)
Sodium: 139 mmol/L (ref 135–145)
TCO2: 34 mmol/L — ABNORMAL HIGH (ref 22–32)

## 2019-06-12 LAB — GLUCOSE, CAPILLARY: Glucose-Capillary: 81 mg/dL (ref 70–99)

## 2019-06-12 SURGERY — INSERTION, STENT, ARTERY, ILIAC
Anesthesia: General | Site: Arm Upper | Laterality: Right

## 2019-06-12 MED ORDER — SODIUM CHLORIDE 0.9 % IV SOLN
INTRAVENOUS | Status: DC | PRN
  Administered 2019-06-12: 500 mL

## 2019-06-12 MED ORDER — CHLORHEXIDINE GLUCONATE 4 % EX LIQD: 60.0000 mL | Freq: Once | CUTANEOUS | Status: DC

## 2019-06-12 MED ORDER — SODIUM CHLORIDE 0.9 % IV SOLN: INTRAVENOUS | Status: DC

## 2019-06-12 MED ORDER — IODIXANOL 320 MG/ML IV SOLN
INTRAVENOUS | Status: DC | PRN
  Administered 2019-06-12: 33 mL via INTRAVENOUS

## 2019-06-12 MED ORDER — DEXAMETHASONE SODIUM PHOSPHATE 10 MG/ML IJ SOLN
INTRAMUSCULAR | Status: DC | PRN
  Administered 2019-06-12: 5 mg via INTRAVENOUS

## 2019-06-12 MED ORDER — 0.9 % SODIUM CHLORIDE (POUR BTL) OPTIME
TOPICAL | Status: DC | PRN
  Administered 2019-06-12: 1000 mL

## 2019-06-12 MED ORDER — PROPOFOL 10 MG/ML IV BOLUS
INTRAVENOUS | Status: DC | PRN
  Administered 2019-06-12: 150 mg via INTRAVENOUS

## 2019-06-12 MED ORDER — PROPOFOL 10 MG/ML IV BOLUS
INTRAVENOUS | Status: AC
  Filled 2019-06-12: qty 20

## 2019-06-12 MED ORDER — SODIUM CHLORIDE 0.9 % IV SOLN
INTRAVENOUS | Status: AC
  Filled 2019-06-12: qty 1.2

## 2019-06-12 MED ORDER — ACETAMINOPHEN 500 MG PO TABS: 1000.0000 mg | ORAL_TABLET | Freq: Once | ORAL | Status: AC

## 2019-06-12 MED ORDER — PHENYLEPHRINE HCL-NACL 10-0.9 MG/250ML-% IV SOLN
INTRAVENOUS | Status: DC | PRN
Start: 2019-06-12 — End: 2019-06-12
  Administered 2019-06-12: 40 ug/min via INTRAVENOUS

## 2019-06-12 MED ORDER — MIDAZOLAM HCL 5 MG/5ML IJ SOLN
INTRAMUSCULAR | Status: DC | PRN
  Administered 2019-06-12: 1 mg via INTRAVENOUS

## 2019-06-12 MED ORDER — FENTANYL CITRATE (PF) 250 MCG/5ML IJ SOLN
INTRAMUSCULAR | Status: AC
  Filled 2019-06-12: qty 5

## 2019-06-12 MED ORDER — ONDANSETRON HCL 4 MG/2ML IJ SOLN
INTRAMUSCULAR | Status: DC | PRN
  Administered 2019-06-12: 4 mg via INTRAVENOUS

## 2019-06-12 MED ORDER — CEFAZOLIN SODIUM-DEXTROSE 2-4 GM/100ML-% IV SOLN
2.0000 g | INTRAVENOUS | Status: AC
  Administered 2019-06-12: 2 g via INTRAVENOUS
  Filled 2019-06-12: qty 100

## 2019-06-12 MED ORDER — MIDAZOLAM HCL 2 MG/2ML IJ SOLN
INTRAMUSCULAR | Status: AC
  Filled 2019-06-12: qty 2

## 2019-06-12 MED ORDER — PHENYLEPHRINE HCL (PRESSORS) 10 MG/ML IV SOLN
INTRAVENOUS | Status: DC | PRN
  Administered 2019-06-12 (×2): 80 ug via INTRAVENOUS

## 2019-06-12 MED ORDER — SODIUM CHLORIDE 0.9 % IV SOLN: INTRAVENOUS | Status: DC | PRN

## 2019-06-12 MED ORDER — LIDOCAINE HCL (PF) 1 % IJ SOLN
INTRAMUSCULAR | Status: AC
  Filled 2019-06-12: qty 30

## 2019-06-12 MED ORDER — ACETAMINOPHEN 500 MG PO TABS
ORAL_TABLET | ORAL | Status: AC
  Administered 2019-06-12: 1000 mg via ORAL
  Filled 2019-06-12: qty 2

## 2019-06-12 SURGICAL SUPPLY — 43 items
ARMBAND PINK RESTRICT EXTREMIT (MISCELLANEOUS) ×5 IMPLANT
BALLN MUSTANG 7X60X75 (BALLOONS) ×5
BALLN MUSTANG 9X20X75 (BALLOONS) ×5
BALLOON MUSTANG 7X60X75 (BALLOONS) ×3 IMPLANT
BALLOON MUSTANG 9X20X75 (BALLOONS) ×3 IMPLANT
CANISTER SUCT 3000ML PPV (MISCELLANEOUS) ×5 IMPLANT
CLIP VESOCCLUDE MED 6/CT (CLIP) ×5 IMPLANT
CLIP VESOCCLUDE SM WIDE 6/CT (CLIP) ×5 IMPLANT
COVER PROBE W GEL 5X96 (DRAPES) IMPLANT
COVER WAND RF STERILE (DRAPES) IMPLANT
DERMABOND ADVANCED (GAUZE/BANDAGES/DRESSINGS) ×2
DERMABOND ADVANCED .7 DNX12 (GAUZE/BANDAGES/DRESSINGS) ×3 IMPLANT
DRSG TEGADERM 2-3/8X2-3/4 SM (GAUZE/BANDAGES/DRESSINGS) ×5 IMPLANT
ELECT REM PT RETURN 9FT ADLT (ELECTROSURGICAL) ×5
ELECTRODE REM PT RTRN 9FT ADLT (ELECTROSURGICAL) ×3 IMPLANT
GLOVE BIOGEL PI IND STRL 7.5 (GLOVE) ×3 IMPLANT
GLOVE BIOGEL PI INDICATOR 7.5 (GLOVE) ×2
GLOVE SURG SS PI 7.5 STRL IVOR (GLOVE) ×5 IMPLANT
GOWN STRL REUS W/ TWL LRG LVL3 (GOWN DISPOSABLE) ×6 IMPLANT
GOWN STRL REUS W/ TWL XL LVL3 (GOWN DISPOSABLE) ×3 IMPLANT
GOWN STRL REUS W/TWL LRG LVL3 (GOWN DISPOSABLE) ×10
GOWN STRL REUS W/TWL XL LVL3 (GOWN DISPOSABLE) ×5
HEMOSTAT SNOW SURGICEL 2X4 (HEMOSTASIS) IMPLANT
KIT BASIN OR (CUSTOM PROCEDURE TRAY) ×5 IMPLANT
KIT ENCORE 26 ADVANTAGE (KITS) ×5 IMPLANT
KIT TURNOVER KIT B (KITS) ×5 IMPLANT
NS IRRIG 1000ML POUR BTL (IV SOLUTION) ×5 IMPLANT
PACK CV ACCESS (CUSTOM PROCEDURE TRAY) ×5 IMPLANT
PAD ARMBOARD 7.5X6 YLW CONV (MISCELLANEOUS) ×10 IMPLANT
SET MICROPUNCTURE 5F STIFF (MISCELLANEOUS) ×5 IMPLANT
SHEATH PINNACLE R/O II 6F 4CM (SHEATH) ×5 IMPLANT
SPONGE GAUZE 2X2 8PLY STER LF (GAUZE/BANDAGES/DRESSINGS) ×1
SPONGE GAUZE 2X2 8PLY STRL LF (GAUZE/BANDAGES/DRESSINGS) ×4 IMPLANT
STENT INNOVA 8X60X130 (Permanent Stent) ×5 IMPLANT
SUT PROLENE 6 0 CC (SUTURE) ×5 IMPLANT
SUT VIC AB 3-0 SH 27 (SUTURE) ×5
SUT VIC AB 3-0 SH 27X BRD (SUTURE) ×3 IMPLANT
SUT VICRYL 4-0 PS2 18IN ABS (SUTURE) IMPLANT
TOWEL GREEN STERILE (TOWEL DISPOSABLE) ×5 IMPLANT
TUBING EXTENTION W/L.L. (IV SETS) ×5 IMPLANT
UNDERPAD 30X36 HEAVY ABSORB (UNDERPADS AND DIAPERS) ×5 IMPLANT
WATER STERILE IRR 1000ML POUR (IV SOLUTION) ×5 IMPLANT
WIRE BENTSON .035X145CM (WIRE) ×5 IMPLANT

## 2019-06-12 NOTE — Anesthesia Preprocedure Evaluation (Addendum)
Anesthesia Evaluation  Patient identified by MRN, date of birth, ID band Patient awake    Reviewed: Allergy & Precautions, H&P , NPO status , Patient's Chart, lab work & pertinent test results  Airway Mallampati: II  TM Distance: >3 FB Neck ROM: Full    Dental no notable dental hx. (+) Partial Lower, Partial Upper, Dental Advisory Given   Pulmonary neg pulmonary ROS,    Pulmonary exam normal breath sounds clear to auscultation       Cardiovascular hypertension, Pt. on medications +CHF   Rhythm:Regular Rate:Normal     Neuro/Psych negative neurological ROS  negative psych ROS   GI/Hepatic Neg liver ROS, GERD  Medicated,  Endo/Other  Morbid obesity  Renal/GU ESRF and DialysisRenal disease  negative genitourinary   Musculoskeletal  (+) Arthritis , Osteoarthritis,    Abdominal   Peds  Hematology  (+) Blood dyscrasia, anemia ,   Anesthesia Other Findings   Reproductive/Obstetrics negative OB ROS                            Anesthesia Physical Anesthesia Plan  ASA: III  Anesthesia Plan: General   Post-op Pain Management:    Induction: Intravenous  PONV Risk Score and Plan: 4 or greater and Ondansetron, Dexamethasone and Midazolam  Airway Management Planned: LMA  Additional Equipment:   Intra-op Plan:   Post-operative Plan: Extubation in OR  Informed Consent: I have reviewed the patients History and Physical, chart, labs and discussed the procedure including the risks, benefits and alternatives for the proposed anesthesia with the patient or authorized representative who has indicated his/her understanding and acceptance.     Dental advisory given  Plan Discussed with: CRNA  Anesthesia Plan Comments:         Anesthesia Quick Evaluation

## 2019-06-12 NOTE — Anesthesia Postprocedure Evaluation (Signed)
Anesthesia Post Note  Patient: Karen [name]  Procedure(s) Performed: Insertion Of right basilic vein Stent (Arm Upper) Fistulogram of right upper arm arteriovenous fistula (Right Arm Upper)     Patient location during evaluation: PACU Anesthesia Type: General Level of consciousness: awake and alert Pain management: pain level controlled Vital Signs Assessment: post-procedure vital signs reviewed and stable Respiratory status: spontaneous breathing, nonlabored ventilation and respiratory function stable Cardiovascular status: blood pressure returned to baseline and stable Postop Assessment: no apparent nausea or vomiting Anesthetic complications: no    Last Vitals:  Vitals:   06/12/19 1235 06/12/19 1245  BP: 117/68   Pulse: 84 90  Resp: 16 16  Temp: 36.5 C   SpO2: 100% 99%    Last Pain:  Vitals:   06/12/19 1235  TempSrc:   PainSc: 0-No pain                 Willaim Mode,W. EDMOND

## 2019-06-12 NOTE — Anesthesia Procedure Notes (Signed)
Procedure Name: LMA Insertion Date/Time: 06/12/2019 11:04 AM Performed by: Gaylene Brooks, CRNA Pre-anesthesia Checklist: Patient identified, Emergency Drugs available, Suction available and Patient being monitored Patient Re-evaluated:Patient Re-evaluated prior to induction Oxygen Delivery Method: Circle System Utilized Preoxygenation: Pre-oxygenation with 100% oxygen Induction Type: IV induction Ventilation: Mask ventilation without difficulty LMA: LMA inserted LMA Size: 4.0 Number of attempts: 1 Placement Confirmation: positive ETCO2 Tube secured with: Tape Dental Injury: Teeth and Oropharynx as per pre-operative assessment

## 2019-06-12 NOTE — Op Note (Signed)
    Patient name: Karen [name] MRN: [id number] DOB: 12-09-1955 Sex: female  06/12/2019 Pre-operative Diagnosis: End-stage renal disease Post-operative diagnosis:  Same Surgeon:  [name]  Assistant: Risa Grill Procedure Performed:  1. Ultrasound-guided access, right basilic vein  2. Fistulogram  3. Placement of 8 x 60 INNOVA stent in the basilic vein    Indications: The patient is having low flow rates with dialysis. She underwent a fistulogram a month ago and had angioplasty of the basilic vein. She comes in today for surgical revision.  Procedure:  The patient was identified in the holding area and taken to room 8.  The patient was then placed supine on the table and prepped and draped in the usual sterile fashion.  A time out was called. General anesthesia was administered. Ultrasound was used to evaluate the fistula.  The vein was patent and compressible.  A digital ultrasound image was acquired.  The fistula was then accessed under ultrasound guidance using a micropuncture needle.  An 018 wire was then asvanced without resistance and a micropuncture sheath was placed.  Contrast injections were then performed through the sheath.  Findings: The location of the stenosis appeared to be up into the axilla. Because of this location I did not feel that surgical correction was the best option. Therefore I elected to proceed with stenting. A 035 versa core wire was then inserted across the lesion and a 6 French sheath was placed. I elected to primarily stent the area. A 8 x 60 /innoVA stent was inserted and postdilated with a 7 mm balloon. Completion imaging showed a small residual stenosis within the stent and so a 9 x 20 balloon was used to dilate this area. After this was completed, the fistula was widely patent. While the balloon was inflated I evaluated the proximal anastomosis. The vein is somewhat narrowed at this area however there was no identifiable stenosis and so no treatment was  rendered. The sheath was removed and the cannulation site was closed with a 4-0 Monocryl. There were no immediate complications.    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     Theotis Burrow, M.D., [facility] [place] Office: [phone] Pager:  [phone]

## 2019-06-12 NOTE — Interval H&P Note (Signed)
History and Physical Interval Note:  06/12/2019 9:59 AM  Karen [name]  has presented today for surgery, with the diagnosis of END STAGE RENAL DISEASE.  The various methods of treatment have been discussed with the patient and family. After consideration of risks, benefits and other options for treatment, the patient has consented to  Procedure(s): REVISON OF RIGHT UPPER EXTREMITY ARTERIOVENOUS FISTULA (Right) as a surgical intervention.  The patient's history has been reviewed, patient examined, no change in status, stable for surgery.  I have reviewed the patient's chart and labs.  Questions were answered to the patient's satisfaction.     [name]

## 2019-06-12 NOTE — Transfer of Care (Signed)
Immediate Anesthesia Transfer of Care Note  Patient: Karen [name]  Procedure(s) Performed: Insertion Of right basilic vein Stent (Arm Upper) Fistulogram of right upper arm arteriovenous fistula (Right Arm Upper)  Patient Location: PACU  Anesthesia Type:General  Level of Consciousness: awake, drowsy and patient cooperative  Airway & Oxygen Therapy: Patient Spontanous Breathing and Patient connected to face mask oxygen  Post-op Assessment: Report given to RN, Post -op Vital signs reviewed and stable and Patient moving all extremities X 4  Post vital signs: Reviewed and stable  Last Vitals:  Vitals Value Taken Time  BP 117/68 06/12/19 1235  Temp 36.5 C 06/12/19 1235  Pulse 90 06/12/19 1245  Resp 16 06/12/19 1245  SpO2 99 % 06/12/19 1245  Vitals shown include unvalidated device data.  Last Pain:  Vitals:   06/12/19 1235  TempSrc:   PainSc: 0-No pain      Patients Stated Pain Goal: 3 (98/[id number]/82 [id number])  Complications: No apparent anesthesia complications

## 2019-06-13 ENCOUNTER — Encounter: Payer: Self-pay | Admitting: *Deleted

## 2019-06-17 ENCOUNTER — Ambulatory Visit: Payer: Medicare Other | Admitting: Cardiovascular Disease

## 2019-06-17 ENCOUNTER — Ambulatory Visit (INDEPENDENT_AMBULATORY_CARE_PROVIDER_SITE_OTHER): Payer: Medicare Other | Admitting: Cardiovascular Disease

## 2019-06-17 ENCOUNTER — Other Ambulatory Visit: Payer: Self-pay

## 2019-06-17 ENCOUNTER — Encounter: Payer: Self-pay | Admitting: Cardiovascular Disease

## 2019-06-17 DIAGNOSIS — R0789 Other chest pain: Secondary | ICD-10-CM | POA: Diagnosis not present

## 2019-06-17 DIAGNOSIS — E782 Mixed hyperlipidemia: Secondary | ICD-10-CM

## 2019-06-17 DIAGNOSIS — I480 Paroxysmal atrial fibrillation: Secondary | ICD-10-CM

## 2019-06-17 NOTE — Patient Instructions (Signed)
Medication Instructions:  Your physician recommends that you continue on your current medications as directed. Please refer to the Current Medication list given to you today.  *If you need a refill on your cardiac medications before your next appointment, please call your pharmacy*   Follow-Up: At [facility], you and your health needs are our priority.  As part of our continuing mission to provide you with exceptional heart care, we have created designated Provider Care Teams.  These Care Teams include your primary Cardiologist (physician) and Advanced Practice Providers (APPs -  Physician Assistants and Nurse Practitioners) who all work together to provide you with the care you need, when you need it.  We recommend signing up for the patient portal called "MyChart".  Sign up information is provided on this After Visit Summary.  MyChart is used to connect with patients for Virtual Visits (Telemedicine).  Patients are able to view lab/test results, encounter notes, upcoming appointments, etc.  Non-urgent messages can be sent to your provider as well.   To learn more about what you can do with MyChart, go to [email or web address].    Your next appointment:   6 month(s) with one of the following Advanced Practice Providers on your designated Care Team: - Kerin Ransom PA-C - [place] FNP  12 months with Dr. Gwenlyn Found

## 2019-06-17 NOTE — Assessment & Plan Note (Signed)
History of paroxysmal A. fib last documented in 2017 after dialysis.  Recent event monitor showed runs of PSVT but no atrial fibrillation.  She is not on oral anticoagulation.  She is followed in the A. fib clinic by Roderic Palau, NP

## 2019-06-17 NOTE — Assessment & Plan Note (Signed)
History of hyperlipidemia on statin therapy with lipid profile performed 01/17/2018 revealing total cholesterol 185, LDL 115 HDL 48.

## 2019-06-17 NOTE — Progress Notes (Signed)
06/17/2019 Oleh Genin   1955-10-28  [id number]  Primary Physician Lucianne Lei, MD Primary Cardiologist: Lorretta Harp MD Lupe Carney, [place]  HPI:  Karen [name] is a 64 y.o.  married African American female mother of 2, grandmother to 2 grandchildren who currently works as an Sales executive.  I last saw her in the office 02/20/2019.  Her cardiac risk factor profile is notable for hypertension but otherwise are negative. For the last 3 weeks prior to her last office visit she had noticed substernal chest pain radiating to both upper extremities occurring on a daily basis. She does have reflux but she says the symptoms are different. I ordered a Myoview stress test which was entirely normal. Since I saw her in the office a year ago her chest pain has resolved. She has gone on hemodialysis one year ago. 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  She was admitted to [facility] 01/07/2018 with atypical chest pain. She ruled out for myocardial infarction. Her d-dimer was mildly elevated and a CTA was negative for PE. She was seen by Dr. 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 He did hear a rub. Her diltiazem was discontinued as was her aspirin.  Since I saw her in the office 3 months ago she continues to do well.  She gets occasional atypical noncardiac chest pain.  Myoview stress test performed 05/01/2019 was nonischemic and an event monitor showed present predominantly sinus rhythm with short runs of PSVT.  She did have a drug-coated stent placed in her fistula recently by Dr. Trula Slade.    Current Meds  Medication Sig  . acetaminophen (TYLENOL) 500 MG tablet Take 1,000 mg by mouth daily as needed for mild pain, moderate pain or headache.   Marland Kitchen amoxicillin (AMOXIL) 500 MG capsule Take  2,000 mg by mouth See admin instructions. Take 4 capsules (2000 mg) by mouth one hour prior to dental procedures  . clobetasol cream (TEMOVATE) 1.01 % Apply 1 application topically daily as needed (for rash).   Marland Kitchen diltiazem (CARDIZEM) 30 MG tablet PLEASE SEE ATTACHED FOR DETAILED DIRECTIONS (Patient taking differently: Take 15 mg by mouth every Monday, Wednesday, and Friday. After dialysis.)  . doxercalciferol (HECTOROL) 0.5 MCG capsule Take 0.5 mcg by mouth every Monday, Wednesday, and Friday with hemodialysis.   Marland Kitchen ethyl chloride spray Apply 1 application topically every Monday, Wednesday, and Friday with hemodialysis.   Marland Kitchen HYDROcodone-acetaminophen (NORCO) 5-325 MG tablet Take 1 tablet by mouth every 6 (six) hours as needed for moderate pain.  Marland Kitchen lidocaine-prilocaine (EMLA) cream Apply 1 application topically See admin instructions. Apply topically Monday, Wednesday and Friday before dialysis  . LOPERAMIDE HCL PO Take by mouth.  . midodrine (PROAMATINE) 10 MG tablet Take 20 mg by mouth every Monday, Wednesday, and Friday. Prior to dialysis  . multivitamin (RENA-VIT) TABS tablet Take 1 tablet by mouth daily.   . ondansetron (ZOFRAN) 8 MG tablet Take 8 mg by mouth every 8 (eight) hours as needed for nausea or vomiting.  . pantoprazole (PROTONIX) 40 MG tablet TAKE 1 TABLET (40 MG TOTAL) BY MOUTH DAILY. (Patient taking differently: Take 40 mg by mouth daily before breakfast. )  . prochlorperazine (COMPAZINE) 10 MG tablet Take 10 mg by mouth every 6 (six) hours as needed (Nausea or vomiting).  . sevelamer carbonate (RENVELA) 800 MG tablet Take  1,600-2,400 mg by mouth See admin instructions. Take 3 tablets (2400 mg) by mouth with each meal & 2 tablets (1600 mg) by mouth with each snack.  . simvastatin (ZOCOR) 20 MG tablet TAKE 1 TABLET BY MOUTH EVERYDAY AT BEDTIME (Patient taking differently: Take 20 mg by mouth at bedtime. )     No Known Allergies  Social History   Socioeconomic History  . Marital  status: Married    Spouse name: Not on file  . Number of children: 2  . Years of education: Not on file  . Highest education level: Not on file  Occupational History  . Occupation: SHERIFF'S OFFICE    Employer: [employer]  Tobacco Use  . Smoking status: Never Smoker  . Smokeless tobacco: Never Used  Substance and Sexual Activity  . Alcohol use: No  . Drug use: No  . Sexual activity: Not on file    Comment: Hysterectomy  Other Topics Concern  . Not on file  Social History Narrative  . Not on file   Social Determinants of Health   Financial Resource Strain:   . Difficulty of Paying Living Expenses:   Food Insecurity:   . Worried About Charity fundraiser in the Last Year:   . Arboriculturist in the Last Year:   Transportation Needs:   . Film/video editor (Medical):   Marland Kitchen Lack of Transportation (Non-Medical):   Physical Activity:   . Days of Exercise per Week:   . Minutes of Exercise per Session:   Stress:   . Feeling of Stress :   Social Connections:   . Frequency of Communication with Friends and Family:   . Frequency of Social Gatherings with Friends and Family:   . Attends Religious Services:   . Active Member of Clubs or Organizations:   . Attends Archivist Meetings:   Marland Kitchen Marital Status:   Intimate Partner Violence:   . Fear of Current or Ex-Partner:   . Emotionally Abused:   Marland Kitchen Physically Abused:   . Sexually Abused:      Review of Systems: General: negative for chills, fever, night sweats or weight changes.  Cardiovascular: negative for chest pain, dyspnea on exertion, edema, orthopnea, palpitations, paroxysmal nocturnal dyspnea or shortness of breath Dermatological: negative for rash Respiratory: negative for cough or wheezing Urologic: negative for hematuria Abdominal: negative for nausea, vomiting, diarrhea, bright red blood per rectum, melena, or hematemesis Neurologic: negative for visual changes, syncope, or dizziness All other  systems reviewed and are otherwise negative except as noted above.    Blood pressure 106/73, pulse (!) 108, height 5\' 1"  (1.549 m), weight 197 lb 6.4 oz (89.5 kg), SpO2 98 %.  General appearance: alert and no distress Neck: no adenopathy, no carotid bruit, no JVD, supple, symmetrical, trachea midline and thyroid not enlarged, symmetric, no tenderness/mass/nodules Lungs: clear to auscultation bilaterally Heart: regular rate and rhythm, S1, S2 normal, no murmur, click, rub or gallop Extremities: extremities normal, atraumatic, no cyanosis or edema Pulses: 2+ and symmetric Skin: Skin color, texture, turgor normal. No rashes or lesions Neurologic: Alert and oriented X 3, normal strength and tone. Normal symmetric reflexes. Normal coordination and gait  EKG not performed today  ASSESSMENT AND PLAN:   Paroxysmal atrial fibrillation (HCC) History of paroxysmal A. fib last documented in 2017 after dialysis.  Recent event monitor showed runs of PSVT but no atrial fibrillation.  She is not on oral anticoagulation.  She is followed in the A. fib  clinic by Roderic Palau, NP  Chest pain Occasional atypical chest pain with a recent Myoview performed 05/01/2019 which was low risk and nonischemic  Hyperlipidemia History of hyperlipidemia on statin therapy with lipid profile performed 01/17/2018 revealing total cholesterol 185, LDL 115 HDL 48.      Lorretta Harp MD FACP,FACC,FAHA, [facility] 06/17/2019 11:18 AM

## 2019-06-17 NOTE — Assessment & Plan Note (Signed)
Occasional atypical chest pain with a recent Myoview performed 05/01/2019 which was low risk and nonischemic

## 2019-06-24 ENCOUNTER — Other Ambulatory Visit: Payer: Self-pay

## 2019-06-24 ENCOUNTER — Ambulatory Visit
Admission: RE | Admit: 2019-06-24 | Discharge: 2019-06-24 | Disposition: A | Discharge status: Home / Self Care | Payer: Medicare Other | Source: Ambulatory Visit | Attending: Family Medicine | Admitting: Family Medicine

## 2019-06-24 DIAGNOSIS — Z1231 Encounter for screening mammogram for malignant neoplasm of breast: Secondary | ICD-10-CM

## 2019-06-26 ENCOUNTER — Telehealth: Payer: Self-pay

## 2019-06-26 NOTE — Telephone Encounter (Signed)
Pt called stating she was at a doctor appt today and he wanted her to inquire about having a suture removed from her 5/27 surgery. Per PA we have scheduled her for this appt at our office next week. Pt will call us back if she has any questions/concerns.

## 2019-07-02 ENCOUNTER — Ambulatory Visit (INDEPENDENT_AMBULATORY_CARE_PROVIDER_SITE_OTHER): Payer: Medicare Other | Admitting: Physician Assistant

## 2019-07-02 ENCOUNTER — Other Ambulatory Visit: Payer: Self-pay

## 2019-07-02 ENCOUNTER — Ambulatory Visit: Payer: Medicare Other | Admitting: Internal Medicine

## 2019-07-02 VITALS — BP 85/61 | HR 100 | Temp 97.3°F | Resp 14 | Ht 61.0 in | Wt 197.0 lb

## 2019-07-02 DIAGNOSIS — Z992 Dependence on renal dialysis: Secondary | ICD-10-CM

## 2019-07-02 DIAGNOSIS — N186 End stage renal disease: Secondary | ICD-10-CM

## 2019-07-02 NOTE — Progress Notes (Signed)
Established Dialysis Access   History of Present Illness   Karen [name] is a 64 y.o. (1955/11/19) female who presents for  follow up of right basilic vein fistula.  She most recently had a fistulogram with placement of a 8 x 60 INNOVA stent in the basilic vein due to low flow rates in dialysis and no improvement following prior angioplasty of the basilic vein. Her flow has been good and fistula has been working well at dialysis since.   She presents today with some concerns about a suture in her right upper arm. She was seen at a doctor appointment on 6/10 and it was recommended that she follow up to have the suture evaluated to see if it needed to be removed. She says she does not have any discomfort or irritation from the suture. No bleeding. She denies any right upper extremity pain, bleeding, swelling or steal symptoms.   The patient's PMH, PSH, SH, and FamHx were reviewed and are unchanged  Current Outpatient Medications  Medication Sig Dispense Refill  . acetaminophen (TYLENOL) 500 MG tablet Take 1,000 mg by mouth daily as needed for mild pain, moderate pain or headache.     . clobetasol cream (TEMOVATE) 4.74 % Apply 1 application topically daily as needed (for rash).     Marland Kitchen diltiazem (CARDIZEM) 30 MG tablet PLEASE SEE ATTACHED FOR DETAILED DIRECTIONS (Patient taking differently: Take 15 mg by mouth every Monday, Wednesday, and Friday. After dialysis.) 45 tablet 3  . doxercalciferol (HECTOROL) 0.5 MCG capsule Take 0.5 mcg by mouth every Monday, Wednesday, and Friday with hemodialysis.     Marland Kitchen ethyl chloride spray Apply 1 application topically every Monday, Wednesday, and Friday with hemodialysis.     Marland Kitchen HYDROcodone-acetaminophen (NORCO) 5-325 MG tablet Take 1 tablet by mouth every 6 (six) hours as needed for moderate pain. 20 tablet 0  . lidocaine-prilocaine (EMLA) cream Apply 1 application topically See admin instructions. Apply topically Monday, Wednesday and Friday before  dialysis  6  . LOPERAMIDE HCL PO Take by mouth.    . midodrine (PROAMATINE) 10 MG tablet Take 20 mg by mouth every Monday, Wednesday, and Friday. Prior to dialysis    . multivitamin (RENA-VIT) TABS tablet Take 1 tablet by mouth daily.     . ondansetron (ZOFRAN) 8 MG tablet Take 8 mg by mouth every 8 (eight) hours as needed for nausea or vomiting.    . pantoprazole (PROTONIX) 40 MG tablet TAKE 1 TABLET (40 MG TOTAL) BY MOUTH DAILY. (Patient taking differently: Take 40 mg by mouth daily before breakfast. ) 90 tablet 1  . prochlorperazine (COMPAZINE) 10 MG tablet Take 10 mg by mouth every 6 (six) hours as needed (Nausea or vomiting).    . sevelamer carbonate (RENVELA) 800 MG tablet Take 1,600-2,400 mg by mouth See admin instructions. Take 3 tablets (2400 mg) by mouth with each meal & 2 tablets (1600 mg) by mouth with each snack.    . simvastatin (ZOCOR) 20 MG tablet TAKE 1 TABLET BY MOUTH EVERYDAY AT BEDTIME (Patient taking differently: Take 20 mg by mouth at bedtime. ) 90 tablet 3  . amoxicillin (AMOXIL) 500 MG capsule Take 2,000 mg by mouth See admin instructions. Take 4 capsules (2000 mg) by mouth one hour prior to dental procedures (Patient not taking: Reported on 07/02/2019)     No current facility-administered medications for this visit.     Physical Examination   Vitals:   07/02/19 1409  BP: (!) 85/61  Pulse: 100  Resp: 14  Temp: (!) 97.3 F (36.3 C)  TempSrc: Temporal  SpO2: 100%  Weight: 197 lb (89.4 kg)  Height: 5\' 1"  (1.549 m)   Body mass index is 37.22 kg/m.  General Well developed and well nourished, not in any acute discomfort  Pulmonary Non labored, clear to auscultation bilaterally  Cardiac Regular rate and rhythm without murmur  Vascular Vessel Right Left  Radial Palpable Palpable  Brachial Palpable Palpable  Ulnar Faintly palpable Faintly palpable    Musculo- skeletal M/S 5/5 throughout  , Extremities without ischemic changes    Neurologic A&O; CN grossly  intact     Medical Decision Making    Karen [name] is a 64 y.o. female who presents for follow up of right basilic vein fistula.  She most recently had a fistulogram with placement of a 8 x 60 INNOVA stent in the basilic vein due to low flow rates in dialysis and no improvement following prior angioplasty of the basilic vein. Her fistula is working well and she is not having any issues at dialysis. She presented for retained 4-0 Monocryl suture. I trimmed off the exposed piece. The right upper arm otherwise has healed well with no signs of infection. She has no steal symptoms.  She does have a TDC placed by [facility] that will need removed when the dialysis center is comfortable with the functioning of the fistula. The patient has requested that we (Dr. Trula Slade) remove it when It comes that time since it has been in there almost 1.5 years. I told her normally whoever places the catheter is the one to remove it but advised her to call when she is ready to have it taken out and we can see what we can arrange for her She can follow up as needed or if she has further issues with her fistula functioning.  Karoline Caldwell, PA-C [facility] [place] Office: [phone]  Clinic MD: Dr. Scot Dock

## 2019-08-03 ENCOUNTER — Other Ambulatory Visit: Payer: Self-pay | Admitting: Cardiovascular Disease

## 2019-08-12 ENCOUNTER — Ambulatory Visit (HOSPITAL_COMMUNITY)
Admission: RE | Admit: 2019-08-12 | Discharge: 2019-08-12 | Disposition: A | Discharge status: Home / Self Care | Payer: Medicare Other | Source: Ambulatory Visit | Attending: Nephrology | Admitting: Nephrology

## 2019-08-12 ENCOUNTER — Other Ambulatory Visit: Payer: Self-pay

## 2019-08-12 DIAGNOSIS — N186 End stage renal disease: Secondary | ICD-10-CM | POA: Insufficient documentation

## 2019-08-12 DIAGNOSIS — Z4901 Encounter for fitting and adjustment of extracorporeal dialysis catheter: Secondary | ICD-10-CM | POA: Insufficient documentation

## 2019-08-12 HISTORY — PX: IR REMOVAL TUN CV CATH W/O FL: IMG2289

## 2019-08-12 MED ORDER — LIDOCAINE HCL 1 % IJ SOLN
INTRAMUSCULAR | Status: AC
  Filled 2019-08-12: qty 20

## 2019-08-12 MED ORDER — CHLORHEXIDINE GLUCONATE 4 % EX LIQD
CUTANEOUS | Status: AC
  Filled 2019-08-12: qty 15

## 2019-08-12 NOTE — Procedures (Signed)
Pre procedural Dx: ESRD Post procedural Dx: Same  Successful removal of tunneled HD catheter. Catheter removed intact   EBL: None No immediate complications.  Please see imaging section of Epic for full dictation.  Jacqualine Mau NP 08/12/2019 2:03 PM

## 2019-11-03 ENCOUNTER — Other Ambulatory Visit (HOSPITAL_COMMUNITY): Payer: Self-pay | Admitting: Nurse Practitioner

## 2019-12-25 IMAGING — CT CT ANGIO CHEST
2 of 8 series · 18 of 46 positions shown · IV contrast (iopamidol)
Comparison: Chest radiograph January 06, 2018 and CT chest
October 05, 2017 the

CLINICAL DATA: Chest pain since dialysis today. History of breast
cancer.

EXAM:
CT ANGIOGRAPHY CHEST WITH CONTRAST
TECHNIQUE: Multidetector CT imaging of the chest was performed using the
standard protocol during bolus administration of intravenous
contrast. Multiplanar CT image reconstructions and MIPs were
obtained to evaluate the vascular anatomy.
CONTRAST:  100mL HUJVBJ-KSQ IOPAMIDOL (HUJVBJ-KSQ) INJECTION 76%

[Series 6: thins · axial · 0.63mm/px · z∈[+1206,+1430]mm · 15 of 248 slices shown]
[im 12/248  lung]
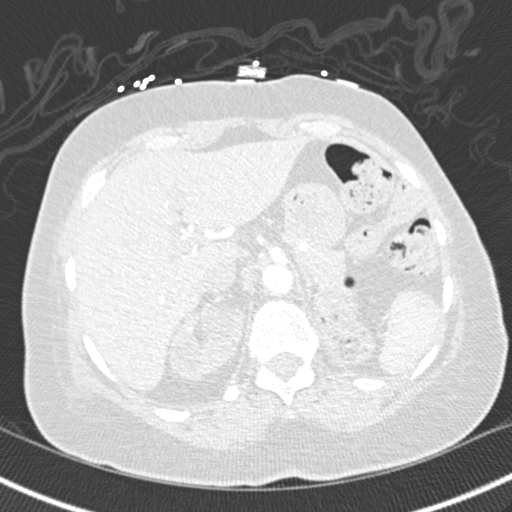
[im 34/248  soft-tissue]
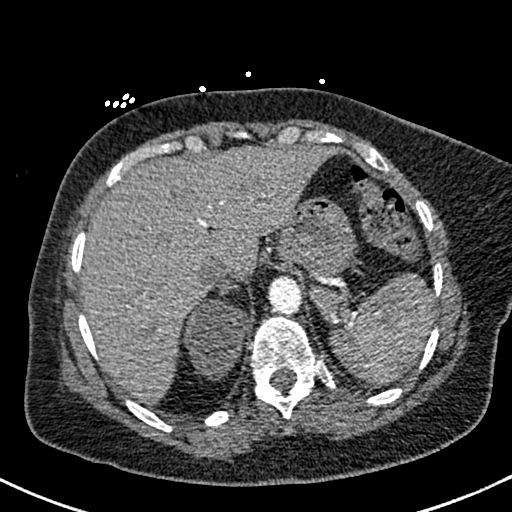
[im 45/248  lung]
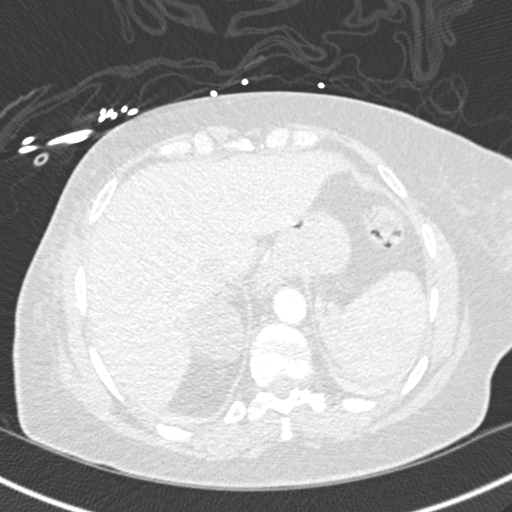
[im 57/248  soft-tissue]
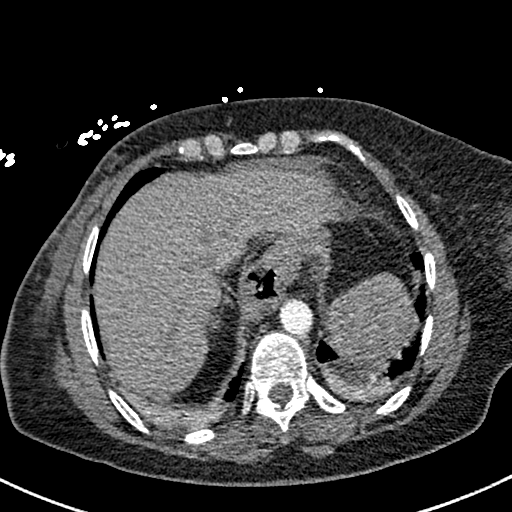
[im 79/248  lung]
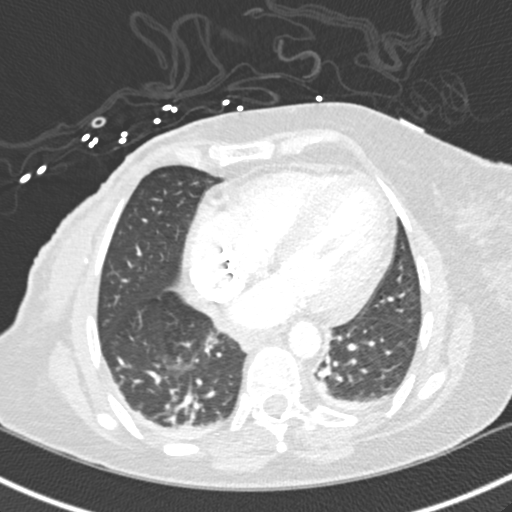
[im 90/248  soft-tissue]
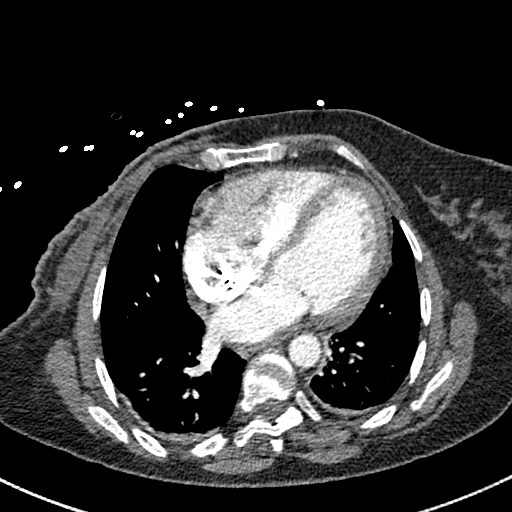
[im 113/248  lung]
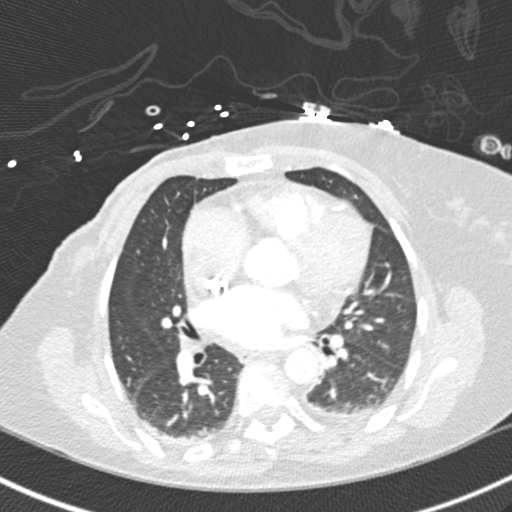
[im 124/248  soft-tissue]
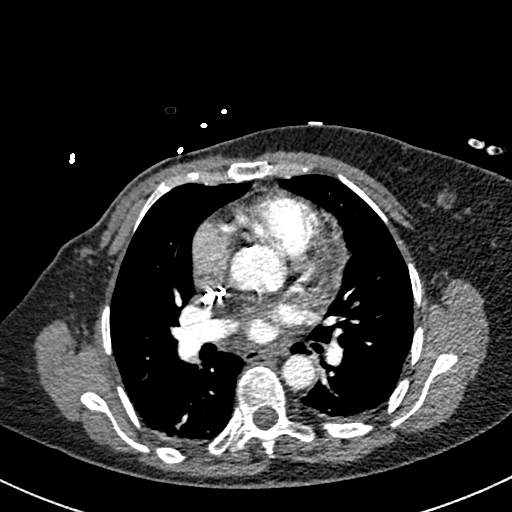
[im 135/248  lung]
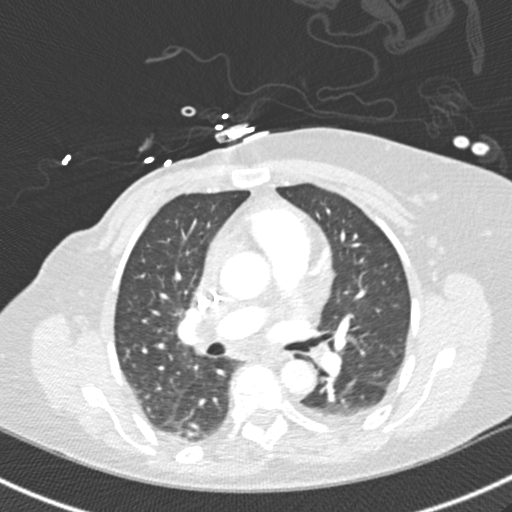
[im 158/248  soft-tissue]
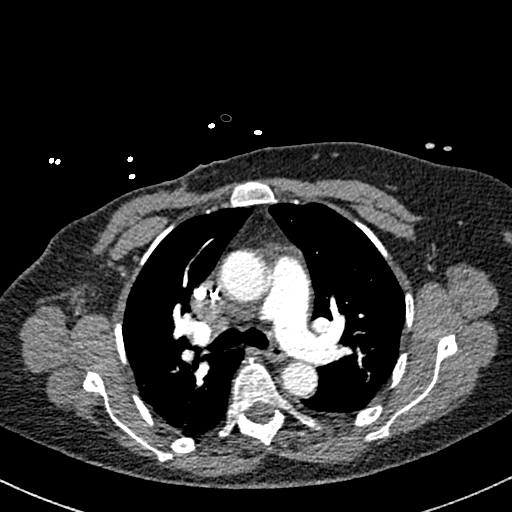
[im 169/248  lung]
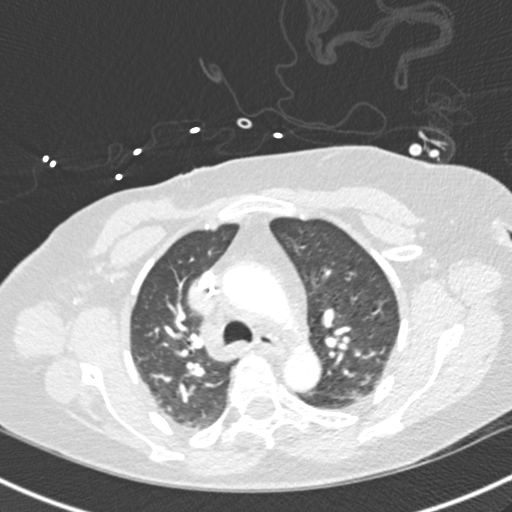
[im 191/248  soft-tissue]
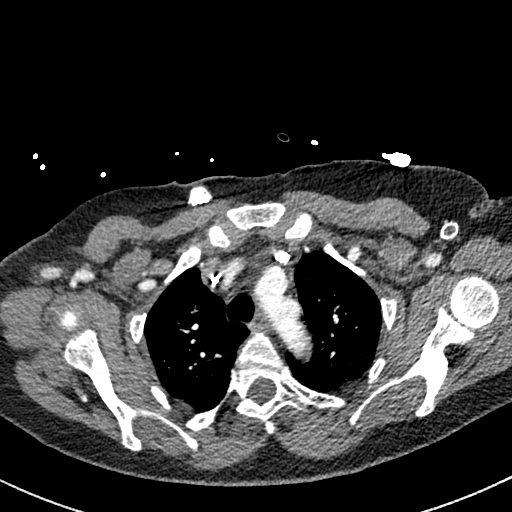
[im 203/248  lung]
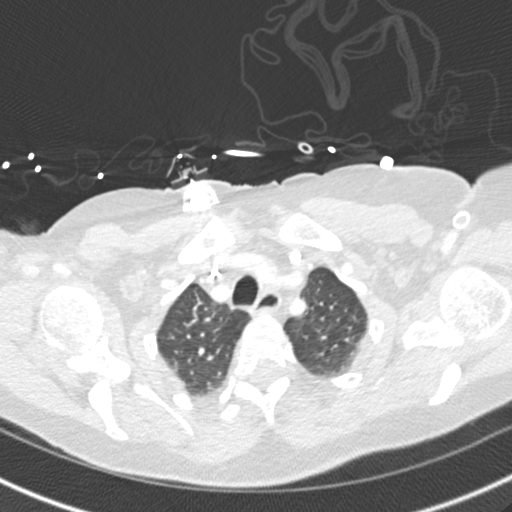
[im 214/248  soft-tissue]
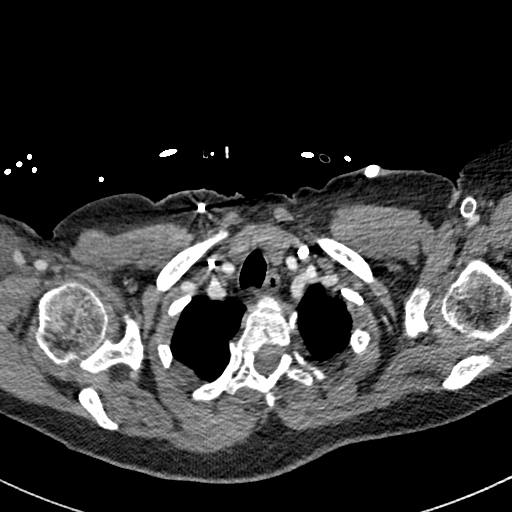
[im 236/248  lung]
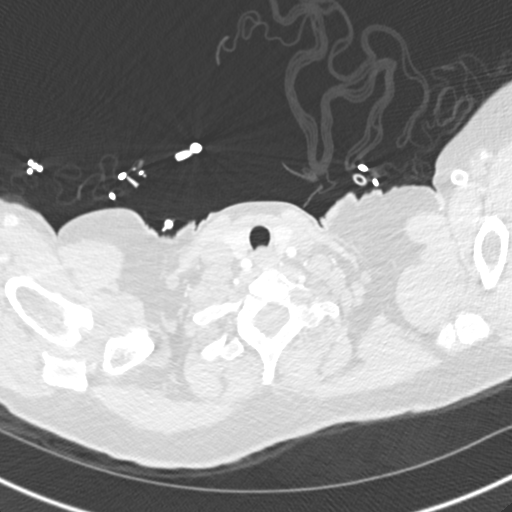

[Series 8: coronal mpr · coronal · 0.51mm/px · 3 of 125 slices shown]
[im 32/125  soft-tissue]
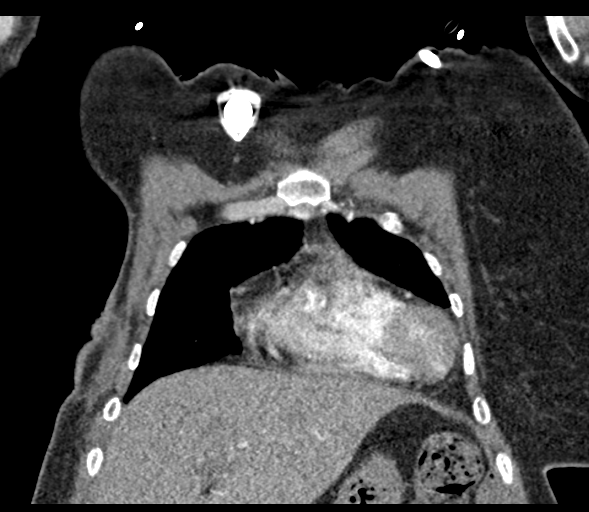
[im 63/125  soft-tissue]
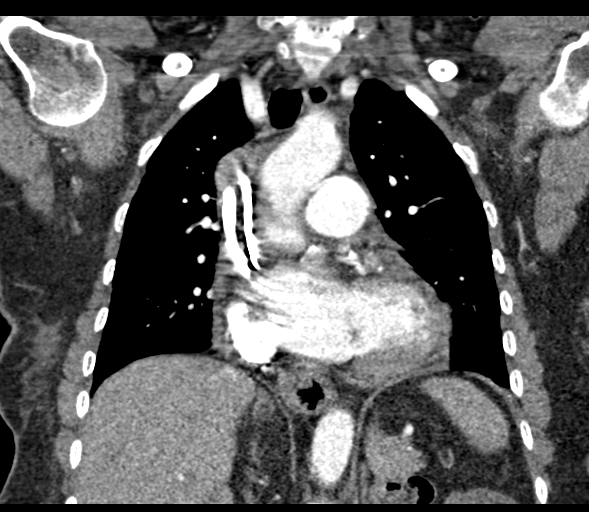
[im 94/125  soft-tissue]
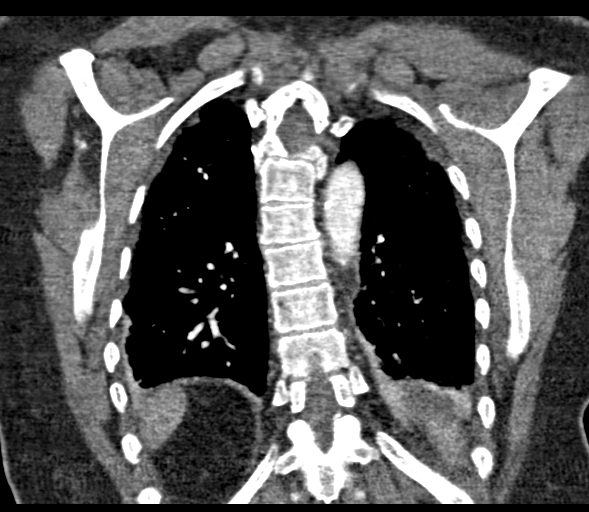

[18 of 46 positions shown; findings below may reference images not displayed]

FINDINGS: CARDIOVASCULAR: Adequate contrast opacification of the pulmonary
artery's. Main pulmonary artery is not enlarged. No pulmonary
arterial filling defects to the level of the subsegmental branches.
The heart is moderately enlarged and unchanged. Minimal coronary
artery calcifications. Trace pericardial effusion. Thoracic aorta is
normal in course and caliber, mild calcific atherosclerosis.
Pulmonary venous congestion.

MEDIASTINUM/NODES: No lymphadenopathy by CT size criteria. RIGHT
chest Port-A-Cath via internal jugular approach and tunneled LEFT
subclavian dialysis catheter with distal tips RIGHT atrium. LEFT
subclavian to axillary venous stent, patency could not be confirmed
non angiographic phase.

LUNGS/PLEURA: Tracheobronchial tree is patent, no pneumothorax. Mild
bronchial wall thickening. Trace pleural effusions and dependent
atelectasis.

UPPER ABDOMEN: Non-acute. Small hiatal hernia. Numerous RIGHT renal
cysts, incompletely imaged. LEFT kidney is not included in field of
view. Subcentimeter probable cyst LEFT lobe of the liver.

MUSCULOSKELETAL: Non-acute. Status post RIGHT mastectomy with small
volume crescentic nonenhancing fluid and surgical clips RIGHT chest
wall. Midthoracic dextroscoliosis.

Review of the MIP images confirms the above findings.
IMPRESSION: 1. No acute pulmonary embolism.
2. Similar cardiomegaly with pulmonary vascular congestion. Trace
pleural effusions and atelectasis.
3. Interval RIGHT mastectomy. Small seroma without image findings of
abscess.

Aortic Atherosclerosis (QT83Z-ODD.D).

## 2019-12-29 ENCOUNTER — Telehealth: Payer: Self-pay | Admitting: Hematology and Oncology

## 2019-12-29 ENCOUNTER — Telehealth: Payer: Self-pay

## 2019-12-29 NOTE — Telephone Encounter (Addendum)
Received referral from Dr. Madelon Lips for low access flows - request fistulagram - To scheduling

## 2019-12-29 NOTE — Telephone Encounter (Signed)
Rescheduled appts per provider PAL. Pt confirmed appt cancellation and new appt date and time.

## 2019-12-29 NOTE — Progress Notes (Signed)
Cardiology Clinic Note   Patient Name: Karen [name] Date of Encounter: 12/30/2019  Primary Care Provider:  Lucianne Lei, MD Primary Cardiologist:  Quay Burow, MD  Patient Profile    Karen [name] 64 year old female presents today for follow-up evaluation of her chest pain, and PAF.  Past Medical History    Past Medical History:  Diagnosis Date  . Anemia   . Arthritis    knees  . Breast cancer ([place]) 2019   Right Breast Cancer  . Cancer ([place])   . Chest pain Jan 2016   low risk Myoview   . CHF (congestive heart failure) ([place])   . Colitis 12/08/2014   [facility]- focal moderate active colitis  . Complication of anesthesia 2005   difficulty remembering for a while and waking up  . Constipation   . Dysrhythmia    h/o A-Fib  . Elevated LFTs 2016   [facility]  . ESRD on dialysis [facility]) April 2016   MWF [place]  . Fundic gland polyps of stomach, benign   . GERD (gastroesophageal reflux disease)   . Heart murmur Nov 2015   Aortic scleosis- no stenosis  . Hemodialysis patient ([place])   . Hypertension   . Shortness of breath dyspnea    with exertion   Past Surgical History:  Procedure Laterality Date  . A/V FISTULAGRAM Right 04/22/2019   Procedure: A/V FISTULAGRAM - Right Upper;  Surgeon: Serafina Mitchell, MD;  Location: [place] CV LAB;  Service: Cardiovascular;  Laterality: Right;  . ABDOMINAL HYSTERECTOMY  2005  . AV FISTULA PLACEMENT Left 12/09/2013   Procedure: INSERTION OF ARTERIOVENOUS (AV) GORE-TEX GRAFT ARM;  Surgeon: Elam Dutch, MD;  Location: [facility] OR;  Service: Vascular;  Laterality: Left;  . AV FISTULA PLACEMENT Right 01/01/2018   Procedure: INSERTION OF ARTERIOVENOUS (AV) GORE-TEX GRAFT ARM RIGHT ARM;  Surgeon: Elam Dutch, MD;  Location: [place];  Service: Vascular;  Laterality: Right;  . AV FISTULA PLACEMENT Right 09/17/2018   Procedure: 1ST STAGE BASILIC ARTERIOVENOUS (AV) FISTULA CREATION RIGHT ARM;  Surgeon: Waynetta Sandy, MD;  Location: [place];  Service: Vascular;  Laterality: Right;  . BASCILIC VEIN TRANSPOSITION Right 11/12/2018   Procedure: SECOND STAGE BASILIC VEIN TRANSPOSITION RIGHT ARM;  Surgeon: Waynetta Sandy, MD;  Location: [place];  Service: Vascular;  Laterality: Right;  . BREAST BIOPSY  1990's  . BUNIONECTOMY Bilateral   . CHOLECYSTECTOMY  2005  . COLONOSCOPY    . FISTULOGRAM Right 06/12/2019   Procedure: Fistulogram of right upper arm arteriovenous fistula;  Surgeon: Serafina Mitchell, MD;  [facility] OR;  Service: Vascular;  Laterality: Right;  . INSERTION OF ILIAC STENT  06/12/2019   Procedure: Insertion Of right basilic vein Stent;  Surgeon: Serafina Mitchell, MD;  Location: [facility] OR;  Service: Vascular;;  . IR AV DIALY SHUNT INTRO NEEDLE/INTRACATH INITIAL W/PTA/IMG RIGHT Right 03/04/2019  . IR GENERIC HISTORICAL  01/11/2016   IR US GUIDE VASC ACCESS RIGHT 01/11/2016 Corrie Mckusick, DO [facility]-INTER[facility] RAD  . IR GENERIC HISTORICAL  01/11/2016   IR RADIOLOGY PERIPHERAL GUIDED IV START 01/11/2016 Corrie Mckusick, DO [facility]  . IR IMAGING GUIDED PORT INSERTION  07/05/2017  . IR REMOVAL TUN CV CATH W/O FL  08/12/2019  . 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 KNEE ARTHROSCOPY Bilateral   . MASTECTOMY W/ SENTINEL NODE BIOPSY Right 11/20/2017  . MASTECTOMY W/ SENTINEL NODE BIOPSY Right 11/20/2017   Procedure: RIGHT  MASTECTOMY WITH SENTINEL LYMPH NODE BIOPSY;  Surgeon: Coralie Keens, MD;  Location: [place];  Service: General;  Laterality: Right;  . PERIPHERAL VASCULAR CATHETERIZATION N/A 08/31/2015   Procedure: A/V Shuntogram;  Surgeon: Serafina Mitchell, MD;  Location: [place] CV LAB;  Service: Cardiovascular;  Laterality: N/A;  . PERIPHERAL VASCULAR CATHETERIZATION Left 08/31/2015   Procedure: Peripheral Vascular Balloon Angioplasty;  Surgeon: Serafina Mitchell, MD;  Location: [place] [facility];  Service: Cardiovascular;  Laterality: Left;  arm fistula  . PERIPHERAL VASCULAR  INTERVENTION Right 04/22/2019   Procedure: PERIPHERAL VASCULAR INTERVENTION;  Surgeon: Serafina Mitchell, MD;  Location: [place] CV LAB;  Service: Cardiovascular;  Laterality: Right;  FISTULA  . PORT-A-CATH REMOVAL N/A 08/12/2018   Procedure: PORT-A-CATH REMOVAL;  Surgeon: Coralie Keens, MD;  Location: [place];  Service: General;  Laterality: N/A;  . ROTATOR CUFF REPAIR Right 1997  . UPPER EXTREMITY VENOGRAPHY Right 12/24/2017   Procedure: UPPER EXTREMITY VENOGRAPHY CENTRAL VENOGRAM;  Surgeon: Waynetta Sandy, MD;  Location: [place] CV LAB;  Service: Cardiovascular;  Laterality: Right;  . UPPER EXTREMITY VENOGRAPHY Bilateral 09/02/2018   Procedure: UPPER EXTREMITY VENOGRAPHY;  Surgeon: Waynetta Sandy, MD;  Location: [place] CV LAB;  Service: Cardiovascular;  Laterality: Bilateral;    Allergies  No Known Allergies  History of Present Illness    [name] has a past medical history of hypertension, paroxysmal atrial fibrillation, right breast cancer, heart murmur, GERD, end-stage renal disease on dialysis Monday Wednesday Friday.  She underwent renal transplant 7/16.  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  She was admitted to the hospital 12/19 with atypical chest pain.  CTA was negative for pulmonary embolism.  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  Her Cardizem was discontinued at that time.  She was seen by Dr. Gwenlyn Found 02/20/2019.  At that time she denied chest pain and shortness of breath.  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  On 04/22/2019 she underwent balloon venoplasty for her right basilic vein and she was noted to have early recurrence of her stenosis.  Surgical intervention was recommended.  She presented to see cardiology on 04/18/2019.  She complained of chest tightness with intermittent episodes that would last about 1 minute.  This was  not with exertion.  A stress test as well as ZIO monitor were ordered.  She had a nuclear stress test on 05/01/2019 showed mild ST deviation 72% EF and low risk.  She presented to see Roderic Palau, NP on 05/15/2019.  This appointment was made at patient request due to mild chest discomfort with radiation into her throat and choking sensation.  She was noted to have at least 1 spells daily both at rest or with exertion.  They were not associated with eating and were noted to spontaneously resolve.  She had an appointment scheduled with Dr. Gwenlyn Found on 06/17/2019.  However, due to presence of symptoms her appointment was scheduled for 05/19/2019.  At that time she denied palpitations, chest pain shortness of breath orthopnea, PND, lower extremity edema, dizziness.  Her cardiac monitor results showed a predominant underlying rhythm of sinus.  Rare SVT with the longest lasting 11 beats, rare PACs and rare PVCs.  No pauses, AV block, or atrial fibrillation.  She presented to the clinic 06/15/2019 for follow-up evaluation and stated she continued to have intermittent brief periods of throat pressure.  Her episodes lasted for around 1 minute and would go away with rest.  She stated she noticed these episodes of pressure when she would bend over and when she laid back.  When asked if her episodes were getting worse, better, or staying the same she indicated that they are about the same as they were at the beginning of April when she presented to the cardiology clinic.  I reviewed with  her the nuclear stress test and her cardiac event monitor.  Her trigger episodes happen with sinus tachycardia.  Her episodes appear to be related to acid reflux and did not appear to be cardiac in nature.  She had not noticed any of the symptoms surrounding her HD on Monday Wednesday Friday.  I will gave her instructions for the GERD diet,  encouraged her to track her symptoms (time/duration, activity/inactivity, and when she was noticing  them through the day).  I had her keep her follow-up visit with Dr. Gwenlyn Found in 1 month.  She was seen by Dr. Gwenlyn Found 06/17/2019.  She was doing well at that time.  She continued to have occasional nontypical noncardiac chest discomfort.  She had a drug-coated stent placed in her fistula by Dr. Trula Slade.  She tolerated the procedure well.  She presents the clinic today for follow-up evaluation and states she feels well.  She denies recent episodes of throat pain/chest pain and arm/neck pain with exertion.  She is working with Dr. Trula Slade with her fistula.  She has recently had some low flow access issues with HD.  Nephrology is sending her back to Dr. Trula Slade to see if there are options for keeping her fistula site swelling and intact.  She continues to monitor her diet closely.  She has not had one episode of palpitations/atrial fibrillation that was short-lived when the past 6 months.  I will give her the salty six diet sheet continue her current medication regimen, none of her follow-up in 6 months and as needed.  Today she denies chest pain, shortness of breath, lower extremity edema, fatigue, palpitations, melena, hematuria, hemoptysis, diaphoresis, weakness, presyncope, syncope, orthopnea, and PND.   Home Medications    Prior to Admission medications   Medication Sig Start Date End Date Taking? Authorizing Provider  acetaminophen (TYLENOL) 500 MG tablet Take 1,000 mg by mouth daily as needed for mild pain, moderate pain or headache.     [provider]  amoxicillin (AMOXIL) 500 MG capsule Take 2,000 mg by mouth See admin instructions. Take 4 capsules (2000 mg) by mouth one hour prior to dental procedures Patient not taking: Reported on 07/02/2019    [provider]  clobetasol cream (TEMOVATE) 6.19 % Apply 1 application topically daily as needed (for rash).     [provider]  diltiazem (CARDIZEM) 30 MG tablet PLEASE SEE ATTACHED FOR DETAILED DIRECTIONS 11/04/19   Lorretta Harp, MD  doxercalciferol (HECTOROL) 0.5 MCG capsule Take 0.5 mcg by mouth every Monday, Wednesday, and Friday with hemodialysis.  04/28/19 04/26/20  [provider]  ethyl chloride spray Apply 1 application topically every Monday, Wednesday, and Friday with hemodialysis.     [provider]  HYDROcodone-acetaminophen (NORCO) 5-325 MG tablet Take 1 tablet by mouth every 6 (six) hours as needed for moderate pain. 11/12/18   Dagoberto Ligas, PA-C  lidocaine-prilocaine (EMLA) cream Apply 1 application topically See admin instructions. Apply topically Monday, Wednesday and Friday before dialysis 05/26/15   [provider]  midodrine (PROAMATINE) 10 MG tablet Take 20  mg by mouth every Monday, Wednesday, and Friday. Prior to dialysis 10/16/18   [provider]  multivitamin (RENA-VIT) TABS tablet Take 1 tablet by mouth daily.     [provider]  ondansetron (ZOFRAN) 8 MG tablet Take 8 mg by mouth every 8 (eight) hours as needed for nausea or vomiting.    [provider]  pantoprazole (PROTONIX) 40 MG tablet TAKE 1 TABLET (40 MG TOTAL) BY MOUTH DAILY. 08/05/19   Lorretta Harp, MD  prochlorperazine (COMPAZINE) 10 MG tablet Take 10 mg by mouth every 6 (six) hours as needed (Nausea or vomiting). 08/21/18 09/10/20  Nicholas Lose, MD  sevelamer carbonate (RENVELA) 800 MG tablet Take 1,600-2,400 mg by mouth See admin instructions. Take 3 tablets (2400 mg) by mouth with each meal & 2 tablets (1600 mg) by mouth with each snack.    [provider]  simvastatin (ZOCOR) 20 MG tablet TAKE 1 TABLET BY MOUTH EVERYDAY AT BEDTIME Patient taking differently: Take 20 mg by mouth at bedtime.  05/19/19   Lorretta Harp, MD    Family History    Family History  Problem Relation Age of Onset  . Kidney disease Mother   . Heart attack Father   . Kidney disease Father   . Diabetes Sister   . Hyperlipidemia Sister   . Hypertension Sister   . Kidney disease  Sister        x2   She indicated that her mother is deceased. She indicated that her father is deceased. She indicated that one of her four sisters is deceased. She indicated that her maternal grandmother is deceased. She indicated that her maternal grandfather is deceased.  Social History    Social History   Socioeconomic History  . Marital status: Married    Spouse name: Not on file  . Number of children: 2  . Years of education: Not on file  . Highest education level: Not on file  Occupational History  . Occupation: SHERIFF'S OFFICE    Employer: [employer]  Tobacco Use  . Smoking status: Never Smoker  . Smokeless tobacco: Never Used  Vaping Use  . Vaping Use: Never used  Substance and Sexual Activity  . Alcohol use: No  . Drug use: No  . Sexual activity: Not on file    Comment: Hysterectomy  Other Topics Concern  . Not on file  Social History Narrative  . Not on file   Social Determinants of Health   Financial Resource Strain: Not on file  Food Insecurity: Not on file  Transportation Needs: Not on file  Physical Activity: Not on file  Stress: Not on file  Social Connections: Not on file  Intimate Partner Violence: Not on file     Review of Systems    General:  No chills, fever, night sweats or weight changes.  Cardiovascular:  No chest pain, dyspnea on exertion, edema, orthopnea, palpitations, paroxysmal nocturnal dyspnea. Dermatological: No rash, lesions/masses Respiratory: No cough, dyspnea Urologic: No hematuria, dysuria Abdominal:   No nausea, vomiting, diarrhea, bright red blood per rectum, melena, or hematemesis Neurologic:  No visual changes, wkns, changes in mental status. All other systems reviewed and are otherwise negative except as noted above.  Physical Exam    VS:  BP 115/72   Pulse 86   Temp (!) 96.8 F (36 C)   Ht 5\' 1"  (1.549 m)   Wt 194 lb 9.6 oz (88.3 kg)   LMP  (LMP Unknown)   SpO2 96%  BMI 36.77 kg/m  , BMI Body mass  index is 36.77 kg/m. GEN: Well nourished, well developed, in no acute distress. HEENT: normal. Neck: Supple, no JVD, carotid bruits, or masses. Cardiac: RRR, no murmurs, rubs, or gallops. No clubbing, cyanosis, edema.  Radials/DP/PT 2+ and equal bilaterally.  Respiratory:  Respirations regular and unlabored, clear to auscultation bilaterally. GI: Soft, nontender, nondistended, BS + x 4. MS: no deformity or atrophy. Skin: warm and dry, no rash. Neuro:  Strength and sensation are intact. Psych: Normal affect.  Accessory Clinical Findings    Recent Labs: 04/15/2019: Platelets 166 06/12/2019: BUN 35; Creatinine, Ser 8.20; Hemoglobin 13.6; Potassium 4.6; Sodium 139   Recent Lipid Panel    Component Value Date/Time   CHOL 165 06/08/2015 0704   TRIG 44 06/08/2015 0704   HDL 59 06/08/2015 0704   CHOLHDL 2.8 06/08/2015 0704   VLDL 9 06/08/2015 0704   LDLCALC 97 06/08/2015 0704    ECG personally reviewed by me today-none today.  EKG 05/15/2019 Normal sinus rhythm 90 bpm  Nuclear stress test 05/01/2019  The left ventricular ejection fraction is hyperdynamic (>65%).  Nuclear stress EF: 72%.  Blood pressure demonstrated a normal response to exercise.  There was no ST segment deviation noted during stress.  This is a low risk study.  Normal resting and stress perfusion. No ischemia or infarction EF 72%  Assessment & Plan   1.  Chest pain-no chest pain today or throat pain.  Continues to have occasional episodes of noncardiac atypical chest pain. Nuclear stress test 05/01/2019 showed normal rest and stress perfusion, no ischemia and EF of 72%. Zio monitor resultsa predominant underlying rhythm of sinus.  Rare SVT with the longest lasting 11 beats, rare PACs and rare PVCs.  No pauses, AV block, or atrial fibrillation.   Continue to monitor  Paroxysmal atrial fibrillation-heart rate today 86 bpm  Continue diltiazem as needed Avoid triggers caffeine, chocolate, EtOH etc. Heart  healthy low-sodium diet Increase physical activity as tolerated  Essential hypertension-BP today 115/72.  Due to intermittent hypotension she requires midodrine during HD days. 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 Gwenlyn Found or me in 6 month.   Jossie Ng. Sahej Schrieber NP-C    12/30/2019, 12:18 PM [facility] [address] Office [phone] Fax [phone]  Notice: This dictation was prepared with Dragon dictation along with smaller phrase technology. Any transcriptional errors that result from this process are unintentional and may not be corrected upon review.

## 2019-12-30 ENCOUNTER — Other Ambulatory Visit: Payer: Self-pay

## 2019-12-30 ENCOUNTER — Ambulatory Visit (INDEPENDENT_AMBULATORY_CARE_PROVIDER_SITE_OTHER): Payer: Medicare Other | Admitting: General Practice

## 2019-12-30 ENCOUNTER — Encounter: Payer: Self-pay | Admitting: General Practice

## 2019-12-30 VITALS — BP 115/72 | HR 86 | Temp 96.8°F | Ht 61.0 in | Wt 194.6 lb

## 2019-12-30 DIAGNOSIS — Z992 Dependence on renal dialysis: Secondary | ICD-10-CM

## 2019-12-30 DIAGNOSIS — I10 Essential (primary) hypertension: Secondary | ICD-10-CM

## 2019-12-30 DIAGNOSIS — N186 End stage renal disease: Secondary | ICD-10-CM

## 2019-12-30 DIAGNOSIS — I480 Paroxysmal atrial fibrillation: Secondary | ICD-10-CM | POA: Diagnosis not present

## 2019-12-30 DIAGNOSIS — R079 Chest pain, unspecified: Secondary | ICD-10-CM | POA: Diagnosis not present

## 2019-12-30 MED ORDER — SODIUM CHLORIDE 0.9% FLUSH
3.0000 mL | Freq: Two times a day (BID) | INTRAVENOUS | Status: DC
Start: 2019-12-30 — End: 2020-08-20

## 2019-12-30 MED ORDER — SODIUM CHLORIDE 0.9% FLUSH: 3.0000 mL | INTRAVENOUS | Status: DC | PRN

## 2019-12-30 MED ORDER — SODIUM CHLORIDE 0.9 % IV SOLN: 250.0000 mL | INTRAVENOUS | Status: DC | PRN

## 2019-12-30 NOTE — Patient Instructions (Signed)
Medication Instructions:  The current medical regimen is effective;  continue present plan and medications as directed. Please refer to the Current Medication list given to you today.  *If you need a refill on your cardiac medications before your next appointment, please call your pharmacy*  Lab Work:   Testing/Procedures:  NONE    NONE  Follow-Up: Your next appointment:  6 month(s) In Person with Quay Burow, MD OR IF UNAVAILABLE [place], FNP-C   At [facility], you and your health needs are our priority.  As part of our continuing mission to provide you with exceptional heart care, we have created designated Provider Care Teams.  These Care Teams include your primary Cardiologist (physician) and Advanced Practice Providers (APPs -  Physician Assistants and Nurse Practitioners) who all work together to provide you with the care you need, when you need it.            6 SALTY THINGS TO AVOID     1,800MG  DAILY

## 2020-01-14 ENCOUNTER — Ambulatory Visit: Payer: Medicare Other | Admitting: Hematology and Oncology

## 2020-01-19 ENCOUNTER — Other Ambulatory Visit: Payer: Self-pay

## 2020-01-19 ENCOUNTER — Other Ambulatory Visit (HOSPITAL_COMMUNITY): Payer: Medicare Other

## 2020-01-26 NOTE — Progress Notes (Signed)
Patient Care Team: Lucianne Lei, MD as PCP - General (Family Medicine) Lorretta Harp, MD as PCP - Cardiology (Cardiology) Nicholas Lose, MD as PCP - Hematology/Oncology (Hematology and Oncology) Donato Heinz, MD (Nephrology) Center, [facility]  DIAGNOSIS:    ICD-10-CM   1. Malignant neoplasm of upper-outer quadrant of right breast in female, estrogen receptor negative ([place])  C50.411    Z17.1     SUMMARY OF ONCOLOGIC HISTORY: Oncology History  Malignant neoplasm of upper-outer quadrant of right breast in female, estrogen receptor negative ([place])  06/19/2017 Initial Diagnosis   Right breast skin thickening and heaviness: Screening mammogram detected 2 cm of microcalcifications in the right breast in the posterior third UOQ.?  Skin involvement, stereotactic biopsy ([id number]) revealed IDC grade 2-3 with high-grade DCIS, lymphovascular invasion present, ER 0%, PR 0%, HER-2 positive ratio 8.69, gene copy #11.3 T1CN0 stage Ia   07/02/2017 Cancer Staging   Staging form: Breast, AJCC 8th Edition - Clinical: Stage IA (cT1c, cN0, cM0, G3, ER-, PR-, HER2+) - Signed by Nicholas Lose, MD on 07/02/2017   07/17/2017 - 08/06/2018 Chemotherapy   Taxol Herceptin weekly x12 (07/17/17-09/25/17) followed by Herceptin maintenance x1 year   11/20/2017 Surgery   Right mastectomy ([id number]) under Dr. Coralie Keens: No residual invasive cancer.  0/1 lymph node negative ER: 0%, negative, PR: 0%, negative, Her2: Positive, ratio 8.69. Ki-67: 30%     CHIEF COMPLIANT: Follow-up of right breast cancer   INTERVAL HISTORY: Karen [name] is a 65 y.o. with above-mentioned history of right breast cancer treated withneoadjuvantchemotherapy, rightmastectomy, maintenance Herceptin, and who is currently on surveillance. Mammogram on 06/24/19 showed no evidence of malignancy in the left breast. She presents to the clinic today for follow-up.   ALLERGIES:  has No Known  Allergies.  MEDICATIONS:  Current Outpatient Medications  Medication Sig Dispense Refill  . acetaminophen (TYLENOL) 500 MG tablet Take 1,000 mg by mouth daily as needed for mild pain, moderate pain or headache.     Marland Kitchen amoxicillin (AMOXIL) 500 MG capsule Take 2,000 mg by mouth See admin instructions. Take 4 capsules (2000 mg) by mouth one hour prior to dental procedures    . clobetasol cream (TEMOVATE) 7.82 % Apply 1 application topically daily as needed (for rash).     Marland Kitchen diltiazem (CARDIZEM) 30 MG tablet PLEASE SEE ATTACHED FOR DETAILED DIRECTIONS (Patient taking differently: Take 30 mg by mouth every Monday, Wednesday, and Friday.) 135 tablet 1  . doxercalciferol (HECTOROL) 0.5 MCG capsule Take 0.5 mcg by mouth every Monday, Wednesday, and Friday with hemodialysis.     Marland Kitchen ethyl chloride spray Apply 1 application topically every Monday, Wednesday, and Friday with hemodialysis.    . fluticasone (FLONASE) 50 MCG/ACT nasal spray Place 1 spray into both nostrils daily as needed for allergies.    Marland Kitchen HYDROcodone-acetaminophen (NORCO) 5-325 MG tablet Take 1 tablet by mouth every 6 (six) hours as needed for moderate pain. 20 tablet 0  . lidocaine-prilocaine (EMLA) cream Apply 1 application topically See admin instructions. Apply topically Monday, Wednesday and Friday before dialysis  6  . midodrine (PROAMATINE) 10 MG tablet Take 20 mg by mouth every Monday, Wednesday, and Friday. Prior to dialysis    . multivitamin (RENA-VIT) TABS tablet Take 1 tablet by mouth daily.     . ondansetron (ZOFRAN) 8 MG tablet Take 8 mg by mouth every 8 (eight) hours as needed for nausea or vomiting.    . pantoprazole (PROTONIX) 40 MG tablet TAKE 1 TABLET (40 MG  TOTAL) BY MOUTH DAILY. (Patient taking differently: Take 40 mg by mouth daily.) 90 tablet 1  . prochlorperazine (COMPAZINE) 10 MG tablet Take 10 mg by mouth every 6 (six) hours as needed (Nausea or vomiting).    . sevelamer carbonate (RENVELA) 800 MG tablet Take  1,600-2,400 mg by mouth See admin instructions. Take 3 tablets (2400 mg) by mouth with each meal & 2 tablets (1600 mg) by mouth with each snack.    . simvastatin (ZOCOR) 20 MG tablet TAKE 1 TABLET BY MOUTH EVERYDAY AT BEDTIME (Patient taking differently: Take 20 mg by mouth at bedtime.) 90 tablet 3   Current Facility-Administered Medications  Medication Dose Route Frequency Provider Last Rate Last Admin  . 0.9 %  sodium chloride infusion  250 mL Intravenous PRN Serafina Mitchell, MD      . sodium chloride flush (NS) 0.9 % injection 3 mL  3 mL Intravenous Q12H Serafina Mitchell, MD      . sodium chloride flush (NS) 0.9 % injection 3 mL  3 mL Intravenous PRN Serafina Mitchell, MD        PHYSICAL EXAMINATION: ECOG PERFORMANCE STATUS: 1 - Symptomatic but completely ambulatory  Vitals:   01/27/20 1108  BP: 121/80  Pulse: 81  Resp: 16  Temp: (!) 97.2 F (36.2 C)  SpO2: 100%   Filed Weights   01/27/20 1108  Weight: 194 lb 6.4 oz (88.2 kg)    BREAST: No palpable masses or nodules in either right or left breasts. No palpable axillary supraclavicular or infraclavicular adenopathy no breast tenderness or nipple discharge. (exam performed in the presence of a chaperone)  LABORATORY DATA:  I have reviewed the data as listed CMP Latest Ref Rng & Units 06/12/2019 04/22/2019 04/15/2019  Glucose 70 - 99 mg/dL 88 95 145(H)  BUN 8 - 23 mg/dL 35(H) 46(H) 38(H)  Creatinine 0.44 - 1.00 mg/dL 8.20(H) 8.40(H) 8.36(H)  Sodium 135 - 145 mmol/L 139 137 137  Potassium 3.5 - 5.1 mmol/L 4.6 5.1 4.1  Chloride 98 - 111 mmol/L 96(L) 97(L) 93(L)  CO2 22 - 32 mmol/L - - 29  Calcium 8.9 - 10.3 mg/dL - - 9.1  Total Protein 6.5 - 8.1 g/dL - - -  Total Bilirubin 0.3 - 1.2 mg/dL - - -  Alkaline Phos 38 - 126 U/L - - -  AST 15 - 41 U/L - - -  ALT 0 - 44 U/L - - -    Lab Results  Component Value Date   WBC 4.5 04/15/2019   HGB 13.6 06/12/2019   HCT 40.0 06/12/2019   MCV 100.5 (H) 04/15/2019   PLT 166 04/15/2019    NEUTROABS 2.6 08/06/2018    ASSESSMENT & PLAN:  Malignant neoplasm of upper-outer quadrant of right breast in female, estrogen receptor negative ([place]) 06/19/2017: Right breast skin thickening and heaviness: Screening mammogram detected 2 cm of microcalcifications in the right breast in the posterior third UOQ.? 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 0/1 lymph node negative ER: 0%, negative, PR: 0%, negative, Her2: Positive, ratio 8.69. Ki-67: 30% Herceptin maintenance completed 08/06/2018 Hospitalization: 01/06/2018 - 01/08/2018: Pericarditis unrelated to treatment: ---------------------------------------------------------------------------------------------------------------------------------------------- Breast cancer surveillance: 1. left breastmammogram 06/25/2019: Benign breast density category C 2.  Breast exam 01/27/2020: Benign, right chest wall scar is without any lumps  or nodules, left breast: No lumps or nodules. No role of antiestrogen therapy because she is ER/PR negative.  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 Anemia due to chronic kidney disease   Return to clinic in 1 year for follow-up    No orders of the defined types were placed in this encounter.  The patient has a good understanding of the overall plan. she agrees with it. she will call with any problems that may develop before the next visit here.  Total time spent: 20 mins including face to face time and time spent for planning, charting and coordination of care  Nicholas Lose, MD 01/27/2020  I, Cloyde Reams Dorshimer, am acting as scribe for Dr. Nicholas Lose.  I have reviewed the above documentation for accuracy and completeness, and I agree with the  above.

## 2020-01-27 ENCOUNTER — Other Ambulatory Visit: Payer: Self-pay

## 2020-01-27 ENCOUNTER — Inpatient Hospital Stay: Payer: Medicare Other | Attending: Hematology and Oncology | Admitting: Hematology and Oncology

## 2020-01-27 DIAGNOSIS — N186 End stage renal disease: Secondary | ICD-10-CM | POA: Diagnosis not present

## 2020-01-27 DIAGNOSIS — Z9011 Acquired absence of right breast and nipple: Secondary | ICD-10-CM | POA: Diagnosis not present

## 2020-01-27 DIAGNOSIS — Z171 Estrogen receptor negative status [ER-]: Secondary | ICD-10-CM

## 2020-01-27 DIAGNOSIS — D631 Anemia in chronic kidney disease: Secondary | ICD-10-CM | POA: Diagnosis not present

## 2020-01-27 DIAGNOSIS — Z853 Personal history of malignant neoplasm of breast: Secondary | ICD-10-CM | POA: Diagnosis present

## 2020-01-27 DIAGNOSIS — C50411 Malignant neoplasm of upper-outer quadrant of right female breast: Secondary | ICD-10-CM | POA: Diagnosis not present

## 2020-01-27 DIAGNOSIS — Z992 Dependence on renal dialysis: Secondary | ICD-10-CM | POA: Diagnosis not present

## 2020-01-27 DIAGNOSIS — Z9221 Personal history of antineoplastic chemotherapy: Secondary | ICD-10-CM | POA: Insufficient documentation

## 2020-01-27 DIAGNOSIS — Z08 Encounter for follow-up examination after completed treatment for malignant neoplasm: Secondary | ICD-10-CM | POA: Insufficient documentation

## 2020-01-27 NOTE — Assessment & Plan Note (Signed)
06/19/2017: Right breast skin thickening and heaviness: Screening mammogram detected 2 cm of microcalcifications in the right breast in the posterior third UOQ.? 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 0/1 lymph node negative ER: 0%, negative, PR: 0%, negative, Her2: Positive, ratio 8.69. Ki-67: 30% Herceptin maintenance completed 08/06/2018 Hospitalization: 01/06/2018 - 01/08/2018: Pericarditis unrelated to treatment: ---------------------------------------------------------------------------------------------------------------------------------------------- Breast cancer surveillance: 1. left breastmammogram 06/25/2019: Benign breast density category C 2.  Breast exam 01/27/2020: Benign  No role of antiestrogen therapy because she is ER/PR negative.  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 Anemia due to chronic kidney disease   Return to clinic in 1 year for follow-up

## 2020-02-02 ENCOUNTER — Other Ambulatory Visit (HOSPITAL_COMMUNITY): Payer: Medicare Other

## 2020-02-09 ENCOUNTER — Other Ambulatory Visit (HOSPITAL_COMMUNITY): Payer: Medicare Other

## 2020-02-16 ENCOUNTER — Other Ambulatory Visit (HOSPITAL_COMMUNITY)
Admission: RE | Admit: 2020-02-16 | Discharge: 2020-02-16 | Disposition: A | Discharge status: Home / Self Care | Payer: Medicare Other | Source: Ambulatory Visit | Attending: Surgery | Admitting: Surgery

## 2020-02-16 DIAGNOSIS — Z20822 Contact with and (suspected) exposure to covid-19: Secondary | ICD-10-CM | POA: Insufficient documentation

## 2020-02-16 DIAGNOSIS — Z01812 Encounter for preprocedural laboratory examination: Secondary | ICD-10-CM | POA: Insufficient documentation

## 2020-02-16 LAB — SARS CORONAVIRUS 2 (TAT 6-24 HRS): SARS Coronavirus 2: NEGATIVE

## 2020-02-17 ENCOUNTER — Encounter (HOSPITAL_COMMUNITY): Payer: Self-pay | Admitting: Surgery

## 2020-02-17 ENCOUNTER — Ambulatory Visit (HOSPITAL_COMMUNITY)
Admission: RE | Admit: 2020-02-17 | Discharge: 2020-02-17 | Disposition: A | Discharge status: Home / Self Care | Payer: Medicare Other | Attending: Surgery | Admitting: Surgery

## 2020-02-17 ENCOUNTER — Other Ambulatory Visit: Payer: Self-pay

## 2020-02-17 ENCOUNTER — Encounter (HOSPITAL_COMMUNITY)
Admission: RE | Disposition: A | Discharge status: Home / Self Care | Payer: Self-pay | Source: Home / Self Care | Attending: Surgery

## 2020-02-17 DIAGNOSIS — T82898A Other specified complication of vascular prosthetic devices, implants and grafts, initial encounter: Secondary | ICD-10-CM | POA: Diagnosis not present

## 2020-02-17 DIAGNOSIS — Y841 Kidney dialysis as the cause of abnormal reaction of the patient, or of later complication, without mention of misadventure at the time of the procedure: Secondary | ICD-10-CM | POA: Insufficient documentation

## 2020-02-17 DIAGNOSIS — N186 End stage renal disease: Secondary | ICD-10-CM | POA: Insufficient documentation

## 2020-02-17 DIAGNOSIS — Z992 Dependence on renal dialysis: Secondary | ICD-10-CM | POA: Insufficient documentation

## 2020-02-17 DIAGNOSIS — T82858A Stenosis of vascular prosthetic devices, implants and grafts, initial encounter: Secondary | ICD-10-CM | POA: Diagnosis present

## 2020-02-17 HISTORY — PX: A/V FISTULAGRAM: CATH118298

## 2020-02-17 HISTORY — PX: PERIPHERAL VASCULAR BALLOON ANGIOPLASTY: CATH118281

## 2020-02-17 LAB — POCT I-STAT, CHEM 8
BUN: 33 mg/dL — ABNORMAL HIGH (ref 8–23)
Calcium, Ion: 1.11 mmol/L — ABNORMAL LOW (ref 1.15–1.40)
Chloride: 98 mmol/L (ref 98–111)
Creatinine, Ser: 5.9 mg/dL — ABNORMAL HIGH (ref 0.44–1.00)
Glucose, Bld: 87 mg/dL (ref 70–99)
HCT: 38 % (ref 36.0–46.0)
Hemoglobin: 12.9 g/dL (ref 12.0–15.0)
Potassium: 4.8 mmol/L (ref 3.5–5.1)
Sodium: 140 mmol/L (ref 135–145)
TCO2: 30 mmol/L (ref 22–32)

## 2020-02-17 SURGERY — A/V FISTULAGRAM
Anesthesia: LOCAL

## 2020-02-17 MED ORDER — LIDOCAINE HCL (PF) 1 % IJ SOLN
INTRAMUSCULAR | Status: DC | PRN
  Administered 2020-02-17: 2 mL

## 2020-02-17 MED ORDER — FENTANYL CITRATE (PF) 100 MCG/2ML IJ SOLN
INTRAMUSCULAR | Status: AC
  Filled 2020-02-17: qty 2

## 2020-02-17 MED ORDER — MIDAZOLAM HCL 2 MG/2ML IJ SOLN
INTRAMUSCULAR | Status: DC | PRN
  Administered 2020-02-17: 1 mg via INTRAVENOUS

## 2020-02-17 MED ORDER — HEPARIN (PORCINE) IN NACL 1000-0.9 UT/500ML-% IV SOLN
INTRAVENOUS | Status: AC
  Filled 2020-02-17: qty 500

## 2020-02-17 MED ORDER — LIDOCAINE HCL (PF) 1 % IJ SOLN
INTRAMUSCULAR | Status: AC
  Filled 2020-02-17: qty 30

## 2020-02-17 MED ORDER — HEPARIN (PORCINE) IN NACL 1000-0.9 UT/500ML-% IV SOLN
INTRAVENOUS | Status: DC | PRN
  Administered 2020-02-17: 500 mL

## 2020-02-17 MED ORDER — IODIXANOL 320 MG/ML IV SOLN
INTRAVENOUS | Status: DC | PRN
  Administered 2020-02-17: 35 mL

## 2020-02-17 MED ORDER — FENTANYL CITRATE (PF) 100 MCG/2ML IJ SOLN
INTRAMUSCULAR | Status: DC | PRN
  Administered 2020-02-17 (×2): 25 ug via INTRAVENOUS

## 2020-02-17 MED ORDER — MIDAZOLAM HCL 2 MG/2ML IJ SOLN
INTRAMUSCULAR | Status: AC
  Filled 2020-02-17: qty 2

## 2020-02-17 SURGICAL SUPPLY — 14 items
BALLN LUTONIX AV 8X60X75 (BALLOONS) ×3
BALLOON LUTONIX AV 8X60X75 (BALLOONS) ×2 IMPLANT
COVER DOME SNAP 22 D (MISCELLANEOUS) ×3 IMPLANT
KIT ENCORE 26 ADVANTAGE (KITS) ×3 IMPLANT
KIT MICROPUNCTURE NIT STIFF (SHEATH) ×3 IMPLANT
PROTECTION STATION PRESSURIZED (MISCELLANEOUS) ×3
SHEATH PINNACLE R/O II 6F 4CM (SHEATH) ×3 IMPLANT
SHEATH PROBE COVER 6X72 (BAG) ×3 IMPLANT
STATION PROTECTION PRESSURIZED (MISCELLANEOUS) ×2 IMPLANT
STOPCOCK MORSE 400PSI 3WAY (MISCELLANEOUS) ×3 IMPLANT
TRAY PV CATH (CUSTOM PROCEDURE TRAY) ×3 IMPLANT
TUBING CIL FLEX 10 FLL-RA (TUBING) ×3 IMPLANT
WIRE HITORQ VERSACORE ST 145CM (WIRE) ×3 IMPLANT
WIRE TORQFLEX AUST .018X40CM (WIRE) ×3 IMPLANT

## 2020-02-17 NOTE — H&P (Signed)
   Patient name: Karen [name] MRN: [id number] DOB: 11-May-1955 Sex: female    HISTORY OF PRESENT ILLNESS:   Karen [name] is a 65 y.o. female with ESRD with lo flow rates in right arm fistula  CURRENT MEDICATIONS:    No current facility-administered medications for this encounter.    REVIEW OF SYSTEMS:   [X]  denotes positive finding, [ ]  denotes negative finding Cardiac  Comments:  Chest pain or chest pressure:    Shortness of breath upon exertion:    Short of breath when lying flat:    Irregular heart rhythm:    Constitutional    Fever or chills:      PHYSICAL EXAM:   Vitals:   02/17/20 0554  BP: 117/61  Pulse: 80  Resp: 16  Temp: 98.4 F (36.9 C)  TempSrc: Oral  SpO2: 100%  Weight: 88.5 kg  Height: 5\' 1"  (1.549 m)    GENERAL: The patient is a well-nourished female, in no acute distress. The vital signs are documented above. CARDIOVASCULAR: There is a regular rate and rhythm. PULMONARY: Non-labored respirations plapable thrill in avf  STUDIES:      MEDICAL ISSUES:   Plan for fistulogram with possible intervention.  All questions answered  Leia Alf, MD, FACS [facility] [phone] Pager [phone]

## 2020-02-17 NOTE — Op Note (Signed)
    Patient name: Karen [name] MRN: [id number] DOB: [birth year] Sex: female  02/17/2020 Pre-operative Diagnosis: Decreased flow rates Post-operative diagnosis:  Same Surgeon:  [name] Procedure Performed:  1.  Ultrasound-guided access, right arm fistula  2.  Fistulogram  3.  Right arm venoplasty, peripheral vein  4.  Conscious sedation, 27 minutes   Indications: Patient is having trouble with flow rates.  She is here for further evaluation.  Procedure:  The patient was identified in the holding area and taken to room 8.  The patient was then placed supine on the table and prepped and draped in the usual sterile fashion.  A time out was called.  Conscious sedation was administered with the use of IV fentanyl and Versed under continuous physician and nurse monitoring.  Heart rate, blood pressure, and oxygen saturations were continuously monitored.  Total sedation time was 27 minutes ultrasound was used to evaluate the fistula.  The vein was patent and compressible.  A digital ultrasound image was acquired.  The fistula was then accessed under ultrasound guidance using a micropuncture needle.  An 018 wire was then asvanced without resistance and a micropuncture sheath was placed.  Contrast injections were then performed through the sheath.  Findings: No evidence of arterial venous anastomotic stenosis.  No evidence of central venous stenosis.  Just proximal to her stent, there was a greater than 80% stenosis.  There is also approximately 50% lesion within the stent.   Intervention: After the above images were acquired the decision was made to proceed with intervention.  A 6 French sheath was inserted.  A versa core wire was advanced across the lesion.  I selected an 8 x 60 drug-coated Lutonix balloon and performed balloon venoplasty across the lesion within the basilic vein.  The balloon was taken to 12 atm.  The patient did not tolerate this at all.  She had significant pain.  The  balloon was left up for 3 minutes.  I pushed the atmospheres up to 18.  I could not get the waist in the balloon to fully release.  The balloon was then let down and advanced forward so that venoplasty of the stent could be completed this was also done at 12 atm for 3 minutes.  I then manipulated the balloon across the stenosis and repeated the balloon venoplasty.  She again did not tolerate this.  Completion imaging was performed which showed residual stenosis of approximately 20%.  I was concerned about possible venous rupture with upsizing the balloon and elected to leave this.  There was a significantly improved thrill across the fistula.  The sheath was removed and a suture was placed.  Impression:  #1 greater than 80% stenosis within the basilic vein treated with an 8 mm drug-coated balloon with residual stenosis of approximately 20%.  The patient did not tolerate treatment of this area.  She remains a candidate for percutaneous intervention.  She will need sedation if this is considered.  Also, stenting of this area could be considered if she does not get a good result future.    Theotis Burrow, M.D., [facility] [place] Office: [phone] Pager:  [phone]

## 2020-02-17 NOTE — Discharge Instructions (Signed)
Dialysis Fistulogram, Care After The following information offers guidance on how to care for yourself after your procedure. Your health care provider may also give you more specific instructions. If you have problems or questions, contact your health care provider. What can I expect after the procedure? After the procedure, it is common to have:  A small amount of discomfort in the area where the small tube (catheter) was placed for the procedure.  A small amount of bruising around the fistula.  Sleepiness and tiredness (fatigue). Follow these instructions at home: Puncture site care  Follow instructions from your health care provider about how to take care of the site where catheters were inserted. Make sure you: ? Wash your hands with soap and water for at least 20 seconds before and after you change your bandage (dressing). If soap and water are not available, use hand sanitizer. ? Change your dressing as told by your health care provider. ? Leave stitches (sutures), skin glue, or adhesive strips in place. These skin closures may need to stay in place for 2 weeks or longer. If adhesive strip edges start to loosen and curl up, you may trim the loose edges. Do not remove adhesive strips completely unless your health care provider tells you to do that.  Check your puncture area every day for signs of infection. Check for: ? More redness, swelling, or pain. ? Fluid or blood. ? Warmth. ? Pus or a bad smell.   Activity  Rest as much as you can.  If you were given a sedative during the procedure, it can affect you for several hours. Do not drive or operate machinery until your health care provider says that it is safe.  Do not lift anything that is heavier than 5 lb (2.3 kg), or the limit that you are told, on the day of your procedure.  Do not do anything strenuous with your arm for the rest of the day. Avoid household activities, such as vacuuming.  Return to your normal activities as  told by your health care provider. Ask your health care provider what activities are safe for you. Safety To prevent damage to your graft or fistula:  Do not wear tight-fitting clothing or jewelry on the arm or leg that has your graft or fistula.  Tell all your health care providers that you have a dialysis fistula or graft.  Do not allow blood draws, IVs, or blood pressure readings to be done in the arm that has your fistula or graft.  Do not allow flu shots or vaccinations in the arm with your fistula or graft. General instructions  Take over-the-counter and prescription medicines only as told by your health care provider.  Do not take baths, swim, or use a hot tub until your health care provider approves. Ask your health care provider if you may take showers. You may only be allowed to take sponge baths.  Monitor your dialysis fistula closely. Check to make sure that you can feel a vibration or buzz (a thrill) when you put your fingers over the fistula.  Keep all follow-up visits. This is important. Contact a health care provider if:  You have more redness, swelling, or pain at the site where the catheter was put in.  You have fluid or blood coming from the catheter site.  You have pus or a bad smell coming from the catheter site.  Your catheter site feels warm.  You have a fever or chills. Get help right away if:    You have bleeding from the vascular access site that does not stop.  You feel weak.  You have trouble balancing.  You have trouble moving your arms or legs.  You have problems with your speech or vision.  You can no longer feel a vibration or buzz when you put your fingers over your fistula.  The limb that was used for the procedure swells or becomes painful, cold, blue, or pale white.  You have chest pain or shortness of breath. These symptoms may represent a serious problem that is an emergency. Do not wait to see if the symptoms will go away. Get  medical help right away. Call your local emergency services (911 in the [place] Do not drive yourself to the hospital. Summary  After a dialysis fistulogram, it is common to have a small amount of discomfort or bruising in the area where the small, thin tube (catheter) was placed.  Rest as much as you can after your procedure. Return to your normal activities as told by your health care provider.  Take over-the-counter and prescription medicines only as told by your health care provider.  Follow instructions from your health care provider about how to take care of the site where the catheter was inserted.  Keep all follow-up visits. This is important. This information is not intended to replace advice given to you by your health care provider. Make sure you discuss any questions you have with your health care provider. Document Revised: 08/13/2019 Document Reviewed: 08/13/2019 Elsevier Patient Education  2021 [employer]  

## 2020-03-07 ENCOUNTER — Other Ambulatory Visit: Payer: Self-pay | Admitting: Cardiovascular Disease

## 2020-06-22 ENCOUNTER — Ambulatory Visit (INDEPENDENT_AMBULATORY_CARE_PROVIDER_SITE_OTHER): Payer: Medicare Other | Admitting: Cardiovascular Disease

## 2020-06-22 ENCOUNTER — Other Ambulatory Visit: Payer: Self-pay

## 2020-06-22 ENCOUNTER — Encounter: Payer: Self-pay | Admitting: Cardiovascular Disease

## 2020-06-22 DIAGNOSIS — I480 Paroxysmal atrial fibrillation: Secondary | ICD-10-CM

## 2020-06-22 DIAGNOSIS — E782 Mixed hyperlipidemia: Secondary | ICD-10-CM

## 2020-06-22 NOTE — Assessment & Plan Note (Signed)
History of PAF with RVR documented in 2017 currently not on oral anticoagulation, maintaining sinus rhythm.  She did have a 2-week Zio patch performed 05/01/2019 that showed occasional PVCs and runs of SVT.

## 2020-06-22 NOTE — Assessment & Plan Note (Signed)
History of hyperlipidemia on simvastatin with lipid profile performed 01/17/2018 revealing total cholesterol of 185, LDL 115 and HDL 48.

## 2020-06-22 NOTE — Progress Notes (Signed)
06/22/2020 Syniah [name] [facility]   [birth year]  [id number]  Primary Physician Lucianne Lei, MD Primary Cardiologist: Lorretta Harp MD Lupe Carney, [place]  HPI:  Karen [name] is a 65 y.o.  married African American female mother of 2, grandmother to 2 grandchildren who worked as an Sales executive, and retired in 2016..I last saw her in the office  06/17/2019. Her cardiac risk factor profile is notable for hypertension but otherwise are negative. For the last 3 weeks prior to her last office visit she had noticed substernal chest pain radiating to both upper extremities occurring on a daily basis. She does have reflux but she says the symptoms are different. I ordered a Myoview stress test which was entirely normal. Since I saw her in the office a year ago her chest pain has resolved. She has gone on hemodialysis one year ago. 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  She was admitted to [facility] 01/07/2018 with atypical chest pain. She ruled out for myocardial infarction. Her d-dimer was mildly elevated and a CTA was negative for PE. She was seen by Dr. 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 He did hear a rub. Her diltiazem was discontinued as was her aspirin.  She had a Myoview stress test performed 05/01/2019 was nonischemic and an event monitor showed present predominantly sinus rhythm with short runs of PSVT.  She did have a drug-coated stent placed in her fistula recently by Dr. Trula Slade.  Since I saw her a year ago she denies chest pain or shortness of breath.  Dr. 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  She continues with hemodialysis without complication.  She did she denies chest pain, shortness of breath or palpitations.   Current Meds  Medication Sig  .  acetaminophen (TYLENOL) 500 MG tablet Take 1,000 mg by mouth daily as needed for mild pain, moderate pain or headache.   Marland Kitchen amoxicillin (AMOXIL) 500 MG capsule Take 2,000 mg by mouth See admin instructions. Take 4 capsules (2000 mg) by mouth one hour prior to dental procedures  . clobetasol cream (TEMOVATE) 9.32 % Apply 1 application topically daily as needed (for rash).   Marland Kitchen diltiazem (CARDIZEM) 30 MG tablet PLEASE SEE ATTACHED FOR DETAILED DIRECTIONS (Patient taking differently: Take 30 mg by mouth every Monday, Wednesday, and Friday.)  . ethyl chloride spray Apply 1 application topically every Monday, Wednesday, and Friday with hemodialysis.  . fluticasone (FLONASE) 50 MCG/ACT nasal spray Place 1 spray into both nostrils daily as needed for allergies.  Marland Kitchen HYDROcodone-acetaminophen (NORCO) 5-325 MG tablet Take 1 tablet by mouth every 6 (six) hours as needed for moderate pain.  Marland Kitchen lidocaine-prilocaine (EMLA) cream Apply 1 application topically See admin instructions. Apply topically Monday, Wednesday and Friday before dialysis  . midodrine (PROAMATINE) 10 MG tablet Take 20 mg by mouth every Monday, Wednesday, and Friday. Prior to dialysis  . multivitamin (RENA-VIT) TABS tablet Take 1 tablet by mouth daily.   . ondansetron (ZOFRAN) 8 MG tablet Take 8 mg by mouth every 8 (eight) hours as needed for nausea or vomiting.  . pantoprazole (PROTONIX) 40 MG tablet TAKE 1 TABLET (40 MG TOTAL) BY MOUTH DAILY.  Marland Kitchen prochlorperazine (COMPAZINE) 10 MG tablet Take 10 mg by mouth every 6 (six) hours as needed (Nausea or vomiting).  . sevelamer carbonate (RENVELA) 800 MG tablet  Take 1,600-2,400 mg by mouth See admin instructions. Take 3 tablets (2400 mg) by mouth with each meal & 2 tablets (1600 mg) by mouth with each snack.  . simvastatin (ZOCOR) 20 MG tablet TAKE 1 TABLET BY MOUTH EVERYDAY AT BEDTIME   Current Facility-Administered Medications for the 06/22/20 encounter (Office Visit) with Lorretta Harp, MD   Medication  . 0.9 %  sodium chloride infusion  . sodium chloride flush (NS) 0.9 % injection 3 mL  . sodium chloride flush (NS) 0.9 % injection 3 mL     No Known Allergies  Social History   Socioeconomic History  . Marital status: Married    Spouse name: Not on file  . Number of children: 2  . Years of education: Not on file  . Highest education level: Not on file  Occupational History  . Occupation: SHERIFF'S OFFICE    Employer: [employer]  Tobacco Use  . Smoking status: Never Smoker  . Smokeless tobacco: Never Used  Vaping Use  . Vaping Use: Never used  Substance and Sexual Activity  . Alcohol use: No  . Drug use: No  . Sexual activity: Not on file    Comment: Hysterectomy  Other Topics Concern  . Not on file  Social History Narrative  . Not on file   Social Determinants of Health   Financial Resource Strain: Not on file  Food Insecurity: Not on file  Transportation Needs: Not on file  Physical Activity: Not on file  Stress: Not on file  Social Connections: Not on file  Intimate Partner Violence: Not on file     Review of Systems: General: negative for chills, fever, night sweats or weight changes.  Cardiovascular: negative for chest pain, dyspnea on exertion, edema, orthopnea, palpitations, paroxysmal nocturnal dyspnea or shortness of breath Dermatological: negative for rash Respiratory: negative for cough or wheezing Urologic: negative for hematuria Abdominal: negative for nausea, vomiting, diarrhea, bright red blood per rectum, melena, or hematemesis Neurologic: negative for visual changes, syncope, or dizziness All other systems reviewed and are otherwise negative except as noted above.    Blood pressure (!) 146/64, pulse 73, height 5\' 1"  (1.549 m), weight 191 lb 3.2 oz (86.7 kg), SpO2 96 %.  General appearance: alert and no distress Neck: no adenopathy, no carotid bruit, no JVD, supple, symmetrical, trachea midline and thyroid not enlarged,  symmetric, no tenderness/mass/nodules Lungs: clear to auscultation bilaterally Heart: regular rate and rhythm, S1, S2 normal, no murmur, click, rub or gallop Extremities: extremities normal, atraumatic, no cyanosis or edema Pulses: 2+ and symmetric Skin: Skin color, texture, turgor normal. No rashes or lesions Neurologic: Alert and oriented X 3, normal strength and tone. Normal symmetric reflexes. Normal coordination and gait  EKG sinus rhythm at 73 without ST or T wave changes.  I personally reviewed this EKG.  ASSESSMENT AND PLAN:   Paroxysmal atrial fibrillation (HCC) History of PAF with RVR documented in 2017 currently not on oral anticoagulation, maintaining sinus rhythm.  She did have a 2-week Zio patch performed 05/01/2019 that showed occasional PVCs and runs of SVT.  Hyperlipidemia History of hyperlipidemia on simvastatin with lipid profile performed 01/17/2018 revealing total cholesterol of 185, LDL 115 and HDL 48.      Lorretta Harp MD FACP,FACC,FAHA, [facility] 06/22/2020 12:08 PM

## 2020-06-22 NOTE — Patient Instructions (Signed)

## 2020-07-01 ENCOUNTER — Other Ambulatory Visit: Payer: Self-pay

## 2020-07-01 MED ORDER — DILTIAZEM HCL 30 MG PO TABS: 30.0000 mg | ORAL_TABLET | ORAL | 1 refills | Status: DC

## 2020-07-14 ENCOUNTER — Other Ambulatory Visit: Payer: Self-pay | Admitting: Family Medicine

## 2020-07-14 DIAGNOSIS — S0990XA Unspecified injury of head, initial encounter: Secondary | ICD-10-CM

## 2020-07-15 ENCOUNTER — Ambulatory Visit
Admission: RE | Admit: 2020-07-15 | Discharge: 2020-07-15 | Disposition: A | Discharge status: Home / Self Care | Payer: Medicare Other | Source: Ambulatory Visit | Attending: Family Medicine | Admitting: Family Medicine

## 2020-07-15 DIAGNOSIS — S0990XA Unspecified injury of head, initial encounter: Secondary | ICD-10-CM

## 2020-07-20 ENCOUNTER — Other Ambulatory Visit: Payer: Self-pay | Admitting: Family Medicine

## 2020-07-20 ENCOUNTER — Other Ambulatory Visit: Payer: Self-pay

## 2020-07-20 ENCOUNTER — Ambulatory Visit: Payer: Medicare Other

## 2020-07-20 ENCOUNTER — Ambulatory Visit
Admission: RE | Admit: 2020-07-20 | Discharge: 2020-07-20 | Disposition: A | Discharge status: Home / Self Care | Payer: Medicare Other | Source: Ambulatory Visit | Attending: Family Medicine | Admitting: Family Medicine

## 2020-07-20 DIAGNOSIS — Z1231 Encounter for screening mammogram for malignant neoplasm of breast: Secondary | ICD-10-CM

## 2020-08-02 ENCOUNTER — Emergency Department (HOSPITAL_COMMUNITY): Payer: Medicare Other | Admitting: Anesthesiology

## 2020-08-02 ENCOUNTER — Encounter (HOSPITAL_COMMUNITY)
Admission: EM | Disposition: A | Discharge status: Rehabilitation Facility | Payer: Self-pay | Source: Home / Self Care | Attending: Neurological Surgery

## 2020-08-02 ENCOUNTER — Inpatient Hospital Stay (HOSPITAL_COMMUNITY)
Admission: EM | Admit: 2020-08-02 | Discharge: 2020-08-07 | DRG: 025 | Disposition: A | Discharge status: Rehabilitation Facility | Payer: Medicare Other | Attending: Neurological Surgery | Admitting: Neurological Surgery

## 2020-08-02 ENCOUNTER — Emergency Department (HOSPITAL_COMMUNITY): Payer: Medicare Other

## 2020-08-02 ENCOUNTER — Other Ambulatory Visit: Payer: Self-pay

## 2020-08-02 ENCOUNTER — Encounter (HOSPITAL_COMMUNITY): Payer: Self-pay | Admitting: Emergency Medicine

## 2020-08-02 DIAGNOSIS — Z20822 Contact with and (suspected) exposure to covid-19: Secondary | ICD-10-CM | POA: Diagnosis present

## 2020-08-02 DIAGNOSIS — Z992 Dependence on renal dialysis: Secondary | ICD-10-CM | POA: Diagnosis not present

## 2020-08-02 DIAGNOSIS — D631 Anemia in chronic kidney disease: Secondary | ICD-10-CM | POA: Diagnosis not present

## 2020-08-02 DIAGNOSIS — Z83438 Family history of other disorder of lipoprotein metabolism and other lipidemia: Secondary | ICD-10-CM | POA: Diagnosis not present

## 2020-08-02 DIAGNOSIS — Z9011 Acquired absence of right breast and nipple: Secondary | ICD-10-CM

## 2020-08-02 DIAGNOSIS — Z7401 Bed confinement status: Secondary | ICD-10-CM

## 2020-08-02 DIAGNOSIS — S065X9A Traumatic subdural hemorrhage with loss of consciousness of unspecified duration, initial encounter: Principal | ICD-10-CM | POA: Diagnosis present

## 2020-08-02 DIAGNOSIS — Z853 Personal history of malignant neoplasm of breast: Secondary | ICD-10-CM | POA: Diagnosis not present

## 2020-08-02 DIAGNOSIS — W19XXXA Unspecified fall, initial encounter: Secondary | ICD-10-CM | POA: Diagnosis present

## 2020-08-02 DIAGNOSIS — S065X2S Traumatic subdural hemorrhage with loss of consciousness of 31 minutes to 59 minutes, sequela: Secondary | ICD-10-CM | POA: Diagnosis not present

## 2020-08-02 DIAGNOSIS — S065XAA Traumatic subdural hemorrhage with loss of consciousness status unknown, initial encounter: Secondary | ICD-10-CM | POA: Diagnosis present

## 2020-08-02 DIAGNOSIS — Z79899 Other long term (current) drug therapy: Secondary | ICD-10-CM | POA: Diagnosis not present

## 2020-08-02 DIAGNOSIS — E669 Obesity, unspecified: Secondary | ICD-10-CM | POA: Diagnosis not present

## 2020-08-02 DIAGNOSIS — N2581 Secondary hyperparathyroidism of renal origin: Secondary | ICD-10-CM | POA: Diagnosis present

## 2020-08-02 DIAGNOSIS — I132 Hypertensive heart and chronic kidney disease with heart failure and with stage 5 chronic kidney disease, or end stage renal disease: Secondary | ICD-10-CM | POA: Diagnosis not present

## 2020-08-02 DIAGNOSIS — S065X0D Traumatic subdural hemorrhage without loss of consciousness, subsequent encounter: Secondary | ICD-10-CM | POA: Diagnosis not present

## 2020-08-02 DIAGNOSIS — E7800 Pure hypercholesterolemia, unspecified: Secondary | ICD-10-CM | POA: Diagnosis not present

## 2020-08-02 DIAGNOSIS — Z841 Family history of disorders of kidney and ureter: Secondary | ICD-10-CM

## 2020-08-02 DIAGNOSIS — I4892 Unspecified atrial flutter: Secondary | ICD-10-CM | POA: Diagnosis not present

## 2020-08-02 DIAGNOSIS — T8612 Kidney transplant failure: Secondary | ICD-10-CM | POA: Diagnosis not present

## 2020-08-02 DIAGNOSIS — I5032 Chronic diastolic (congestive) heart failure: Secondary | ICD-10-CM | POA: Diagnosis not present

## 2020-08-02 DIAGNOSIS — I959 Hypotension, unspecified: Secondary | ICD-10-CM | POA: Diagnosis not present

## 2020-08-02 DIAGNOSIS — I509 Heart failure, unspecified: Secondary | ICD-10-CM | POA: Diagnosis present

## 2020-08-02 DIAGNOSIS — N186 End stage renal disease: Secondary | ICD-10-CM | POA: Diagnosis present

## 2020-08-02 DIAGNOSIS — R079 Chest pain, unspecified: Secondary | ICD-10-CM | POA: Diagnosis not present

## 2020-08-02 DIAGNOSIS — I2583 Coronary atherosclerosis due to lipid rich plaque: Secondary | ICD-10-CM | POA: Diagnosis not present

## 2020-08-02 DIAGNOSIS — D649 Anemia, unspecified: Secondary | ICD-10-CM | POA: Diagnosis not present

## 2020-08-02 DIAGNOSIS — I10 Essential (primary) hypertension: Secondary | ICD-10-CM | POA: Diagnosis not present

## 2020-08-02 DIAGNOSIS — Z9889 Other specified postprocedural states: Secondary | ICD-10-CM

## 2020-08-02 DIAGNOSIS — K219 Gastro-esophageal reflux disease without esophagitis: Secondary | ICD-10-CM | POA: Diagnosis present

## 2020-08-02 DIAGNOSIS — Z8249 Family history of ischemic heart disease and other diseases of the circulatory system: Secondary | ICD-10-CM | POA: Diagnosis not present

## 2020-08-02 DIAGNOSIS — R269 Unspecified abnormalities of gait and mobility: Secondary | ICD-10-CM | POA: Diagnosis not present

## 2020-08-02 DIAGNOSIS — S065X0A Traumatic subdural hemorrhage without loss of consciousness, initial encounter: Secondary | ICD-10-CM | POA: Diagnosis not present

## 2020-08-02 DIAGNOSIS — Z833 Family history of diabetes mellitus: Secondary | ICD-10-CM

## 2020-08-02 DIAGNOSIS — I2510 Atherosclerotic heart disease of native coronary artery without angina pectoris: Secondary | ICD-10-CM | POA: Diagnosis not present

## 2020-08-02 DIAGNOSIS — G44319 Acute post-traumatic headache, not intractable: Secondary | ICD-10-CM | POA: Diagnosis not present

## 2020-08-02 DIAGNOSIS — Z6837 Body mass index (BMI) 37.0-37.9, adult: Secondary | ICD-10-CM | POA: Diagnosis not present

## 2020-08-02 HISTORY — PX: CRANIOTOMY: SHX93

## 2020-08-02 LAB — TYPE AND SCREEN
ABO/RH(D): B POS
Antibody Screen: NEGATIVE

## 2020-08-02 LAB — CBC
HCT: 31.9 % — ABNORMAL LOW (ref 36.0–46.0)
Hemoglobin: 10.4 g/dL — ABNORMAL LOW (ref 12.0–15.0)
MCH: 32.7 pg (ref 26.0–34.0)
MCHC: 32.6 g/dL (ref 30.0–36.0)
MCV: 100.3 fL — ABNORMAL HIGH (ref 80.0–100.0)
Platelets: 196 10*3/uL (ref 150–400)
RBC: 3.18 MIL/uL — ABNORMAL LOW (ref 3.87–5.11)
RDW: 13.6 % (ref 11.5–15.5)
WBC: 3.9 10*3/uL — ABNORMAL LOW (ref 4.0–10.5)
nRBC: 0 % (ref 0.0–0.2)

## 2020-08-02 LAB — BASIC METABOLIC PANEL
Anion gap: 11 (ref 5–15)
BUN: 37 mg/dL — ABNORMAL HIGH (ref 8–23)
CO2: 28 mmol/L (ref 22–32)
Calcium: 8.9 mg/dL (ref 8.9–10.3)
Chloride: 97 mmol/L — ABNORMAL LOW (ref 98–111)
Creatinine, Ser: 7.58 mg/dL — ABNORMAL HIGH (ref 0.44–1.00)
GFR, Estimated: 6 mL/min — ABNORMAL LOW (ref >60)
Glucose, Bld: 101 mg/dL — ABNORMAL HIGH (ref 70–99)
Potassium: 4.7 mmol/L (ref 3.5–5.1)
Sodium: 136 mmol/L (ref 135–145)

## 2020-08-02 LAB — RESP PANEL BY RT-PCR (FLU A&B, COVID) ARPGX2
Influenza A by PCR: NEGATIVE
Influenza B by PCR: NEGATIVE
SARS Coronavirus 2 by RT PCR: NEGATIVE

## 2020-08-02 LAB — PROTIME-INR
INR: 1.1 (ref 0.8–1.2)
Prothrombin Time: 13.8 seconds (ref 11.4–15.2)

## 2020-08-02 LAB — MRSA NEXT GEN BY PCR, NASAL: MRSA by PCR Next Gen: NOT DETECTED

## 2020-08-02 SURGERY — CRANIOTOMY HEMATOMA EVACUATION SUBDURAL
Anesthesia: General | Laterality: Left

## 2020-08-02 MED ORDER — POTASSIUM CHLORIDE IN NACL 20-0.9 MEQ/L-% IV SOLN
INTRAVENOUS | Status: DC
  Filled 2020-08-02: qty 1000

## 2020-08-02 MED ORDER — CEFAZOLIN SODIUM-DEXTROSE 1-4 GM/50ML-% IV SOLN
1.0000 g | Freq: Three times a day (TID) | INTRAVENOUS | Status: AC
  Administered 2020-08-02 – 2020-08-03 (×2): 1 g via INTRAVENOUS
  Filled 2020-08-02 (×2): qty 50

## 2020-08-02 MED ORDER — ONDANSETRON HCL 4 MG/2ML IJ SOLN
INTRAMUSCULAR | Status: AC
  Filled 2020-08-02: qty 2

## 2020-08-02 MED ORDER — MIDODRINE HCL 5 MG PO TABS
20.0000 mg | ORAL_TABLET | ORAL | Status: DC
  Filled 2020-08-02 (×5): qty 4

## 2020-08-02 MED ORDER — THROMBIN 5000 UNITS EX SOLR
OROMUCOSAL | Status: DC | PRN
  Administered 2020-08-02: 5 mL via TOPICAL

## 2020-08-02 MED ORDER — CHLORHEXIDINE GLUCONATE 0.12 % MT SOLN: 15.0000 mL | Freq: Once | OROMUCOSAL | Status: AC

## 2020-08-02 MED ORDER — FENTANYL CITRATE (PF) 100 MCG/2ML IJ SOLN
INTRAMUSCULAR | Status: AC
  Administered 2020-08-02: 25 ug via INTRAVENOUS
  Filled 2020-08-02: qty 2

## 2020-08-02 MED ORDER — SODIUM CHLORIDE 0.9 % IV SOLN: INTRAVENOUS | Status: DC

## 2020-08-02 MED ORDER — DEXAMETHASONE SODIUM PHOSPHATE 4 MG/ML IJ SOLN: 4.0000 mg | Freq: Three times a day (TID) | INTRAMUSCULAR | Status: DC

## 2020-08-02 MED ORDER — PANTOPRAZOLE SODIUM 40 MG IV SOLR
40.0000 mg | Freq: Every day | INTRAVENOUS | Status: DC
  Administered 2020-08-03: 40 mg via INTRAVENOUS
  Filled 2020-08-02: qty 40

## 2020-08-02 MED ORDER — ACETAMINOPHEN 325 MG PO TABS
650.0000 mg | ORAL_TABLET | ORAL | Status: DC | PRN
  Administered 2020-08-04 – 2020-08-07 (×5): 650 mg via ORAL
  Filled 2020-08-02 (×5): qty 2

## 2020-08-02 MED ORDER — PANTOPRAZOLE SODIUM 40 MG PO TBEC
40.0000 mg | DELAYED_RELEASE_TABLET | Freq: Every day | ORAL | Status: DC
  Administered 2020-08-02 – 2020-08-07 (×6): 40 mg via ORAL
  Filled 2020-08-02 (×6): qty 1

## 2020-08-02 MED ORDER — DEXAMETHASONE SODIUM PHOSPHATE 10 MG/ML IJ SOLN
6.0000 mg | Freq: Four times a day (QID) | INTRAMUSCULAR | Status: DC
  Administered 2020-08-02 – 2020-08-03 (×2): 6 mg via INTRAVENOUS
  Filled 2020-08-02 (×2): qty 1

## 2020-08-02 MED ORDER — ORAL CARE MOUTH RINSE: 15.0000 mL | Freq: Once | OROMUCOSAL | Status: AC

## 2020-08-02 MED ORDER — BACITRACIN ZINC 500 UNIT/GM EX OINT
TOPICAL_OINTMENT | CUTANEOUS | Status: DC | PRN
  Administered 2020-08-02 (×2): 1 via TOPICAL

## 2020-08-02 MED ORDER — DEXAMETHASONE SODIUM PHOSPHATE 10 MG/ML IJ SOLN
INTRAMUSCULAR | Status: DC | PRN
  Administered 2020-08-02: 5 mg via INTRAVENOUS

## 2020-08-02 MED ORDER — 0.9 % SODIUM CHLORIDE (POUR BTL) OPTIME
TOPICAL | Status: DC | PRN
  Administered 2020-08-02: 3000 mL

## 2020-08-02 MED ORDER — LEVETIRACETAM IN NACL 1000 MG/100ML IV SOLN
1000.0000 mg | INTRAVENOUS | Status: AC
  Administered 2020-08-02: 1000 mg via INTRAVENOUS
  Filled 2020-08-02: qty 100

## 2020-08-02 MED ORDER — THROMBIN 20000 UNITS EX SOLR
CUTANEOUS | Status: AC
  Filled 2020-08-02: qty 20000

## 2020-08-02 MED ORDER — HYDROCODONE-ACETAMINOPHEN 5-325 MG PO TABS
1.0000 | ORAL_TABLET | Freq: Four times a day (QID) | ORAL | Status: DC | PRN
Start: 2020-08-02 — End: 2020-08-07
  Administered 2020-08-03 – 2020-08-07 (×7): 1 via ORAL
  Filled 2020-08-02 (×7): qty 1

## 2020-08-02 MED ORDER — SENNA 8.6 MG PO TABS
1.0000 | ORAL_TABLET | Freq: Two times a day (BID) | ORAL | Status: DC
  Administered 2020-08-02 – 2020-08-07 (×10): 8.6 mg via ORAL
  Filled 2020-08-02 (×10): qty 1

## 2020-08-02 MED ORDER — CHLORHEXIDINE GLUCONATE CLOTH 2 % EX PADS
6.0000 | MEDICATED_PAD | Freq: Every day | CUTANEOUS | Status: DC
  Administered 2020-08-02 – 2020-08-07 (×4): 6 via TOPICAL

## 2020-08-02 MED ORDER — LIDOCAINE-EPINEPHRINE 1 %-1:100000 IJ SOLN
INTRAMUSCULAR | Status: DC | PRN
  Administered 2020-08-02: 10 mL

## 2020-08-02 MED ORDER — SUGAMMADEX SODIUM 200 MG/2ML IV SOLN
INTRAVENOUS | Status: DC | PRN
  Administered 2020-08-02: 200 mg via INTRAVENOUS

## 2020-08-02 MED ORDER — BACITRACIN ZINC 500 UNIT/GM EX OINT
TOPICAL_OINTMENT | CUTANEOUS | Status: AC
  Filled 2020-08-02: qty 28.35

## 2020-08-02 MED ORDER — PROPOFOL 10 MG/ML IV BOLUS
INTRAVENOUS | Status: AC
  Filled 2020-08-02: qty 20

## 2020-08-02 MED ORDER — ACETAMINOPHEN 650 MG RE SUPP: 650.0000 mg | RECTAL | Status: DC | PRN

## 2020-08-02 MED ORDER — DILTIAZEM HCL 30 MG PO TABS
30.0000 mg | ORAL_TABLET | ORAL | Status: DC
  Administered 2020-08-02 – 2020-08-06 (×3): 30 mg via ORAL
  Filled 2020-08-02 (×5): qty 1

## 2020-08-02 MED ORDER — DEXAMETHASONE SODIUM PHOSPHATE 10 MG/ML IJ SOLN
INTRAMUSCULAR | Status: AC
  Filled 2020-08-02: qty 1

## 2020-08-02 MED ORDER — LIDOCAINE-EPINEPHRINE 1 %-1:100000 IJ SOLN
INTRAMUSCULAR | Status: AC
  Filled 2020-08-02: qty 1

## 2020-08-02 MED ORDER — LABETALOL HCL 5 MG/ML IV SOLN
10.0000 mg | INTRAVENOUS | Status: DC | PRN
  Filled 2020-08-02 (×2): qty 4

## 2020-08-02 MED ORDER — PROMETHAZINE HCL 25 MG PO TABS
12.5000 mg | ORAL_TABLET | ORAL | Status: DC | PRN
  Filled 2020-08-02: qty 1

## 2020-08-02 MED ORDER — ONDANSETRON HCL 4 MG/2ML IJ SOLN
4.0000 mg | INTRAMUSCULAR | Status: DC | PRN
  Administered 2020-08-06: 4 mg via INTRAVENOUS
  Filled 2020-08-02: qty 2

## 2020-08-02 MED ORDER — LIDOCAINE-PRILOCAINE 2.5-2.5 % EX CREA
1.0000 "application " | TOPICAL_CREAM | CUTANEOUS | Status: DC
  Filled 2020-08-02: qty 5

## 2020-08-02 MED ORDER — ONDANSETRON HCL 4 MG PO TABS
8.0000 mg | ORAL_TABLET | Freq: Three times a day (TID) | ORAL | Status: DC | PRN
  Administered 2020-08-05: 8 mg via ORAL
  Filled 2020-08-02: qty 2

## 2020-08-02 MED ORDER — DIPHENHYDRAMINE HCL 50 MG/ML IJ SOLN
12.5000 mg | Freq: Once | INTRAMUSCULAR | Status: AC
  Administered 2020-08-02: 12.5 mg via INTRAVENOUS
  Filled 2020-08-02: qty 1

## 2020-08-02 MED ORDER — MORPHINE SULFATE (PF) 2 MG/ML IV SOLN
1.0000 mg | INTRAVENOUS | Status: DC | PRN
  Filled 2020-08-02: qty 1

## 2020-08-02 MED ORDER — CEFAZOLIN SODIUM-DEXTROSE 2-4 GM/100ML-% IV SOLN
INTRAVENOUS | Status: AC
  Filled 2020-08-02: qty 100

## 2020-08-02 MED ORDER — LEVETIRACETAM IN NACL 500 MG/100ML IV SOLN
500.0000 mg | Freq: Two times a day (BID) | INTRAVENOUS | Status: DC
  Administered 2020-08-02: 500 mg via INTRAVENOUS
  Filled 2020-08-02: qty 100

## 2020-08-02 MED ORDER — CHLORHEXIDINE GLUCONATE 0.12 % MT SOLN
OROMUCOSAL | Status: AC
  Administered 2020-08-02: 15 mL via OROMUCOSAL
  Filled 2020-08-02: qty 15

## 2020-08-02 MED ORDER — ROCURONIUM BROMIDE 100 MG/10ML IV SOLN
INTRAVENOUS | Status: DC | PRN
  Administered 2020-08-02: 70 mg via INTRAVENOUS

## 2020-08-02 MED ORDER — SODIUM CHLORIDE 0.9 % IV SOLN: INTRAVENOUS | Status: DC | PRN

## 2020-08-02 MED ORDER — RENA-VITE PO TABS
1.0000 | ORAL_TABLET | Freq: Every day | ORAL | Status: DC
  Administered 2020-08-02 – 2020-08-07 (×6): 1 via ORAL
  Filled 2020-08-02 (×6): qty 1

## 2020-08-02 MED ORDER — CEFAZOLIN SODIUM-DEXTROSE 2-3 GM-%(50ML) IV SOLR
INTRAVENOUS | Status: DC | PRN
  Administered 2020-08-02: 2 g via INTRAVENOUS

## 2020-08-02 MED ORDER — METOCLOPRAMIDE HCL 5 MG/ML IJ SOLN
5.0000 mg | Freq: Once | INTRAMUSCULAR | Status: AC
  Administered 2020-08-02: 5 mg via INTRAVENOUS
  Filled 2020-08-02: qty 2

## 2020-08-02 MED ORDER — SEVELAMER CARBONATE 800 MG PO TABS: 1600.0000 mg | ORAL_TABLET | ORAL | Status: DC | PRN

## 2020-08-02 MED ORDER — ROCURONIUM BROMIDE 10 MG/ML (PF) SYRINGE
PREFILLED_SYRINGE | INTRAVENOUS | Status: AC
  Filled 2020-08-02: qty 10

## 2020-08-02 MED ORDER — ETHYL CHLORIDE EX AERO: 1.0000 "application " | INHALATION_SPRAY | CUTANEOUS | Status: DC

## 2020-08-02 MED ORDER — SEVELAMER CARBONATE 800 MG PO TABS
2400.0000 mg | ORAL_TABLET | Freq: Three times a day (TID) | ORAL | Status: DC
  Administered 2020-08-03 – 2020-08-07 (×14): 2400 mg via ORAL
  Filled 2020-08-02 (×16): qty 3

## 2020-08-02 MED ORDER — FENTANYL CITRATE (PF) 250 MCG/5ML IJ SOLN
INTRAMUSCULAR | Status: DC | PRN
  Administered 2020-08-02: 100 ug via INTRAVENOUS
  Administered 2020-08-02: 50 ug via INTRAVENOUS

## 2020-08-02 MED ORDER — LIDOCAINE 2% (20 MG/ML) 5 ML SYRINGE
INTRAMUSCULAR | Status: DC | PRN
  Administered 2020-08-02: 100 mg via INTRAVENOUS

## 2020-08-02 MED ORDER — FENTANYL CITRATE (PF) 100 MCG/2ML IJ SOLN: 25.0000 ug | INTRAMUSCULAR | Status: DC | PRN

## 2020-08-02 MED ORDER — THROMBIN 20000 UNITS EX SOLR
CUTANEOUS | Status: DC | PRN
  Administered 2020-08-02: 20 mL via TOPICAL

## 2020-08-02 MED ORDER — THROMBIN 5000 UNITS EX SOLR
CUTANEOUS | Status: AC
  Filled 2020-08-02: qty 5000

## 2020-08-02 MED ORDER — LACTATED RINGERS IV SOLN: INTRAVENOUS | Status: DC

## 2020-08-02 MED ORDER — DEXAMETHASONE SODIUM PHOSPHATE 4 MG/ML IJ SOLN: 4.0000 mg | Freq: Four times a day (QID) | INTRAMUSCULAR | Status: DC

## 2020-08-02 MED ORDER — ONDANSETRON HCL 4 MG/2ML IJ SOLN
INTRAMUSCULAR | Status: DC | PRN
  Administered 2020-08-02: 4 mg via INTRAVENOUS

## 2020-08-02 MED ORDER — PROPOFOL 10 MG/ML IV BOLUS
INTRAVENOUS | Status: DC | PRN
  Administered 2020-08-02: 30 mg via INTRAVENOUS
  Administered 2020-08-02: 100 mg via INTRAVENOUS

## 2020-08-02 MED ORDER — FENTANYL CITRATE (PF) 250 MCG/5ML IJ SOLN
INTRAMUSCULAR | Status: AC
  Filled 2020-08-02: qty 5

## 2020-08-02 MED ORDER — LIDOCAINE 2% (20 MG/ML) 5 ML SYRINGE
INTRAMUSCULAR | Status: AC
  Filled 2020-08-02: qty 5

## 2020-08-02 MED ORDER — ONDANSETRON HCL 4 MG PO TABS
4.0000 mg | ORAL_TABLET | ORAL | Status: DC | PRN
  Administered 2020-08-06: 4 mg via ORAL
  Filled 2020-08-02: qty 1

## 2020-08-02 MED ORDER — SEVELAMER CARBONATE 800 MG PO TABS: 1600.0000 mg | ORAL_TABLET | ORAL | Status: DC

## 2020-08-02 SURGICAL SUPPLY — 57 items
BAG COUNTER SPONGE SURGICOUNT (BAG) ×2 IMPLANT
BAG SPNG CNTER NS LX DISP (BAG) ×1
BAND INSRT 18 STRL LF DISP RB (MISCELLANEOUS)
BAND RUBBER #18 3X1/16 STRL (MISCELLANEOUS) IMPLANT
BUR SPIRAL ROUTER 2.3 (BUR) ×2 IMPLANT
CANISTER SUCT 3000ML PPV (MISCELLANEOUS) ×2 IMPLANT
CATH VENTRIC 35X38 W/TROCAR LG (CATHETERS) ×2 IMPLANT
CLIP VESOCCLUDE MED 6/CT (CLIP) IMPLANT
DRAPE MICROSCOPE LEICA (MISCELLANEOUS) IMPLANT
DRAPE NEUROLOGICAL W/INCISE (DRAPES) ×2 IMPLANT
DRAPE SURG 17X23 STRL (DRAPES) IMPLANT
DRAPE WARM FLUID 44X44 (DRAPES) ×2 IMPLANT
DURAPREP 6ML APPLICATOR 50/CS (WOUND CARE) ×2 IMPLANT
ELECT REM PT RETURN 9FT ADLT (ELECTROSURGICAL) ×2
ELECTRODE REM PT RTRN 9FT ADLT (ELECTROSURGICAL) ×1 IMPLANT
EVACUATOR 1/8 PVC DRAIN (DRAIN) IMPLANT
GAUZE 4X4 16PLY ~~LOC~~+RFID DBL (SPONGE) ×2 IMPLANT
GAUZE SPONGE 4X4 12PLY STRL (GAUZE/BANDAGES/DRESSINGS) ×2 IMPLANT
GAUZE SPONGE 4X4 12PLY STRL LF (GAUZE/BANDAGES/DRESSINGS) ×2 IMPLANT
GLOVE SURG ENC MOIS LTX SZ7 (GLOVE) IMPLANT
GLOVE SURG ENC MOIS LTX SZ8 (GLOVE) ×2 IMPLANT
GLOVE SURG UNDER POLY LF SZ7 (GLOVE) IMPLANT
GOWN STRL REUS W/ TWL LRG LVL3 (GOWN DISPOSABLE) IMPLANT
GOWN STRL REUS W/ TWL XL LVL3 (GOWN DISPOSABLE) IMPLANT
GOWN STRL REUS W/TWL 2XL LVL3 (GOWN DISPOSABLE) IMPLANT
GOWN STRL REUS W/TWL LRG LVL3 (GOWN DISPOSABLE)
GOWN STRL REUS W/TWL XL LVL3 (GOWN DISPOSABLE)
HEMOSTAT POWDER KIT SURGIFOAM (HEMOSTASIS) ×2 IMPLANT
KIT BASIN OR (CUSTOM PROCEDURE TRAY) ×2 IMPLANT
KIT DRAIN CSF ACCUDRAIN (MISCELLANEOUS) ×2 IMPLANT
KIT TURNOVER KIT B (KITS) ×2 IMPLANT
NEEDLE HYPO 22GX1.5 SAFETY (NEEDLE) ×2 IMPLANT
NS IRRIG 1000ML POUR BTL (IV SOLUTION) ×6 IMPLANT
PACK CRANIOTOMY CUSTOM (CUSTOM PROCEDURE TRAY) ×2 IMPLANT
PAD ARMBOARD 7.5X6 YLW CONV (MISCELLANEOUS) ×2 IMPLANT
PATTIES SURGICAL .25X.25 (GAUZE/BANDAGES/DRESSINGS) IMPLANT
PATTIES SURGICAL .5 X.5 (GAUZE/BANDAGES/DRESSINGS) IMPLANT
PATTIES SURGICAL .5 X3 (DISPOSABLE) IMPLANT
PATTIES SURGICAL 1X1 (DISPOSABLE) IMPLANT
PERFORATOR LRG  14-11MM (BIT) ×2
PERFORATOR LRG 14-11MM (BIT) ×1 IMPLANT
PIN MAYFIELD SKULL DISP (PIN) ×2 IMPLANT
PLATE CRANIAL 12 2H RIGID UNI (Plate) ×8 IMPLANT
SCREW UNIII AXS SD 1.5X4 (Screw) ×16 IMPLANT
SPONGE NEURO XRAY DETECT 1X3 (DISPOSABLE) IMPLANT
SPONGE SURGIFOAM ABS GEL 100 (HEMOSTASIS) ×2 IMPLANT
STAPLER VISISTAT 35W (STAPLE) ×2 IMPLANT
SUT ETHILON 3 0 FSL (SUTURE) IMPLANT
SUT NURALON 4 0 TR CR/8 (SUTURE) ×4 IMPLANT
SUT VIC AB 2-0 CP2 18 (SUTURE) ×4 IMPLANT
SYR CONTROL 10ML LL (SYRINGE) ×2 IMPLANT
TAPE PAPER 3X10 WHT MICROPORE (GAUZE/BANDAGES/DRESSINGS) ×2 IMPLANT
TOWEL GREEN STERILE (TOWEL DISPOSABLE) ×2 IMPLANT
TOWEL GREEN STERILE FF (TOWEL DISPOSABLE) ×2 IMPLANT
TRAY FOLEY MTR SLVR 16FR STAT (SET/KITS/TRAYS/PACK) IMPLANT
UNDERPAD 30X36 HEAVY ABSORB (UNDERPADS AND DIAPERS) IMPLANT
WATER STERILE IRR 1000ML POUR (IV SOLUTION) ×2 IMPLANT

## 2020-08-02 NOTE — ED Notes (Signed)
Nurse report called to Altha Harm, RN, transported to short stay bay #[id number] at this time.

## 2020-08-02 NOTE — Transfer of Care (Signed)
Immediate Anesthesia Transfer of Care Note  Patient: Karen [name]  Procedure(s) Performed: CRANIOTOMY HEMATOMA EVACUATION SUBDURAL (Left)  Patient Location: PACU  Anesthesia Type:General  Level of Consciousness: drowsy and patient cooperative  Airway & Oxygen Therapy: Patient Spontanous Breathing and Patient connected to nasal cannula oxygen  Post-op Assessment: Report given to RN, Post -op Vital signs reviewed and stable and Patient moving all extremities  Post vital signs: Reviewed and stable  Last Vitals:  Vitals Value Taken Time  BP 155/93 08/02/20 1627  Temp    Pulse 89 08/02/20 1628  Resp 13 08/02/20 1628  SpO2 100 % 08/02/20 1628  Vitals shown include unvalidated device data.  Last Pain:  Vitals:   08/02/20 1454  TempSrc:   PainSc: 10-Worst pain ever      Patients Stated Pain Goal: 3 (82/50/53 9767)  Complications: No notable events documented.

## 2020-08-02 NOTE — ED Notes (Signed)
Keep NPO! NP with neuro surgery is at the bedside. Surgery today

## 2020-08-02 NOTE — Anesthesia Procedure Notes (Signed)
Procedure Name: Intubation Date/Time: 08/02/2020 3:07 PM Performed by: Moshe Salisbury, CRNA Pre-anesthesia Checklist: Patient identified, Emergency Drugs available, Suction available and Patient being monitored Patient Re-evaluated:Patient Re-evaluated prior to induction Oxygen Delivery Method: Circle System Utilized Preoxygenation: Pre-oxygenation with 100% oxygen Induction Type: IV induction Ventilation: Mask ventilation without difficulty Laryngoscope Size: Mac and 4 Grade View: Grade II Tube type: Oral Tube size: 7.0 mm Number of attempts: 1 Airway Equipment and Method: Stylet Placement Confirmation: ETT inserted through vocal cords under direct vision, positive ETCO2 and breath sounds checked- equal and bilateral Secured at: 21 cm Tube secured with: Tape Dental Injury: Teeth and Oropharynx as per pre-operative assessment

## 2020-08-02 NOTE — ED Triage Notes (Signed)
Pt arrives via EMS- had a fall in June- hit back of her head. Pt has had a worsening headache since then. Usually can improve with tylenol but has not been relieved by that recently. Pt went to dialysis today and only had half of her treatment due to her headache. Pt has had some vision changes/unsteady gait since the fall. She had a CT at the time of the fall that did not reveal anything. Pt has had diarrhea and nausea for a week. Pt vomited at dialysis. EMS gave 4mg  zofran. Pt is not on blood thinners.

## 2020-08-02 NOTE — H&P (Signed)
Karen [name] is an 65 y.o. female.   HPI:  65 year old female presented to the ED today after having worsening headaches. Patient states that she fell about 3 weeks ago when she was trying to clean cobwebs off of her ceiling. She has had some headaches since then but they got significantly worse today while she was at dialysis. Has not had anything to eat or drink today. Does not take any blood thinners other than heparin at dialysis. Significant past medical history includes ESRD and a fib. Denies any NV dizziness or vision changes.   Past Medical History:  Diagnosis Date   Anemia    Arthritis    knees   Breast cancer ([place]) 2019   Right Breast Cancer   Cancer [facility])    Chest pain Jan 2016   low risk Myoview    CHF (congestive heart failure) ([place])    Colitis 12/08/2014   [facility]- focal moderate active colitis   Complication of anesthesia 2005   difficulty remembering for a while and waking up   Constipation    Dysrhythmia    h/o A-Fib   Elevated LFTs 2016   [facility]   ESRD on dialysis [facility]) April 2016   M[facility] [place]   Fundic gland polyps of stomach, benign    GERD (gastroesophageal reflux disease)    Heart murmur Nov 2015   Aortic scleosis- no stenosis   Hemodialysis patient [facility])    Hypertension    Shortness of breath dyspnea    with exertion    Past Surgical History:  Procedure Laterality Date   A/V FISTULAGRAM Right 04/22/2019   Procedure: A/V FISTULAGRAM - Right Upper;  Surgeon: Serafina Mitchell, MD;  Location: [place] CV LAB;  Service: Cardiovascular;  Laterality: Right;   A/V FISTULAGRAM N/A 02/17/2020   Procedure: A/V FISTULAGRAM - Right Upper;  Surgeon: Serafina Mitchell, MD;  Location: [place] CV LAB;  Service: Cardiovascular;  Laterality: N/A;   ABDOMINAL HYSTERECTOMY  2005   AV FISTULA PLACEMENT Left 12/09/2013   Procedure: INSERTION OF ARTERIOVENOUS (AV) GORE-TEX GRAFT ARM;  Surgeon: Elam Dutch, MD;  Location: [place];   Service: Vascular;  Laterality: Left;   AV FISTULA PLACEMENT Right 01/01/2018   Procedure: INSERTION OF ARTERIOVENOUS (AV) GORE-TEX GRAFT ARM RIGHT ARM;  Surgeon: Elam Dutch, MD;  Location: [place];  Service: Vascular;  Laterality: Right;   AV FISTULA PLACEMENT Right 09/17/2018   Procedure: 1ST STAGE BASILIC ARTERIOVENOUS (AV) FISTULA CREATION RIGHT ARM;  Surgeon: Waynetta Sandy, MD;  Location: [place];  Service: Vascular;  Laterality: Right;   [place] Right 11/12/2018   Procedure: SECOND STAGE BASILIC VEIN TRANSPOSITION RIGHT ARM;  Surgeon: Waynetta Sandy, MD;  Location: [place];  Service: Vascular;  Laterality: Right;   BREAST BIOPSY  1990's   BUNIONECTOMY Bilateral    CHOLECYSTECTOMY  2005   COLONOSCOPY     FISTULOGRAM Right 06/12/2019   Procedure: Fistulogram of right upper arm arteriovenous fistula;  Surgeon: Serafina Mitchell, MD;  Location: [facility] OR;  Service: Vascular;  Laterality: Right;   INSERTION OF ILIAC STENT  06/12/2019   Procedure: Insertion Of right basilic vein Stent;  Surgeon: Serafina Mitchell, MD;  Location: [place];  Service: Vascular;;   IR AV DIALY SHUNT INTRO [place] W/PTA/IMG RIGHT Right 03/04/2019   IR GENERIC HISTORICAL  01/11/2016   IR US GUIDE VASC ACCESS RIGHT 01/11/2016 Corrie Mckusick, DO [facility]-INTER[facility]  IR GENERIC HISTORICAL  01/11/2016   IR RADIOLOGY PERIPHERAL GUIDED IV START 01/11/2016 Corrie Mckusick, DO [facility]   IR IMAGING GUIDED PORT INSERTION  07/05/2017   IR REMOVAL TUN CV CATH W/O FL  08/12/2019   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   KNEE ARTHROSCOPY Bilateral    MASTECTOMY W/ SENTINEL NODE BIOPSY Right 11/20/2017   MASTECTOMY W/ SENTINEL NODE BIOPSY Right 11/20/2017   Procedure: RIGHT MASTECTOMY WITH SENTINEL LYMPH NODE BIOPSY;  Surgeon: Coralie Keens, MD;  Location: [place];  Service: General;  Laterality: Right;   PERIPHERAL VASCULAR BALLOON ANGIOPLASTY  02/17/2020   Procedure: PERIPHERAL  VASCULAR BALLOON ANGIOPLASTY;  Surgeon: Serafina Mitchell, MD;  Location: [place] CV LAB;  Service: Cardiovascular;;   PERIPHERAL VASCULAR CATHETERIZATION N/A 08/31/2015   Procedure: A/V Shuntogram;  Surgeon: Serafina Mitchell, MD;  Location: [place] CV LAB;  Service: Cardiovascular;  Laterality: N/A;   PERIPHERAL VASCULAR CATHETERIZATION Left 08/31/2015   Procedure: Peripheral Vascular Balloon Angioplasty;  Surgeon: Serafina Mitchell, MD;  Location: [place] CV LAB;  Service: Cardiovascular;  Laterality: Left;  arm fistula   PERIPHERAL VASCULAR INTERVENTION Right 04/22/2019   Procedure: PERIPHERAL VASCULAR INTERVENTION;  Surgeon: Serafina Mitchell, MD;  Location: [place] CV LAB;  Service: Cardiovascular;  Laterality: Right;  FISTULA   PORT-A-CATH REMOVAL N/A 08/12/2018   Procedure: PORT-A-CATH REMOVAL;  Surgeon: Coralie Keens, MD;  Location: [place];  Service: General;  Laterality: N/A;   [place] Right 12/24/2017   Procedure: UPPER EXTREMITY VENOGRAPHY CENTRAL VENOGRAM;  Surgeon: Waynetta Sandy, MD;  Location: [place] CV LAB;  Service: Cardiovascular;  Laterality: Right;   UPPER EXTREMITY VENOGRAPHY Bilateral 09/02/2018   Procedure: UPPER EXTREMITY VENOGRAPHY;  Surgeon: Waynetta Sandy, MD;  Location: [place] CV LAB;  Service: Cardiovascular;  Laterality: Bilateral;    No Known Allergies  Social History   Tobacco Use   Smoking status: Never   Smokeless tobacco: Never  Substance Use Topics   Alcohol use: No    Family History  Problem Relation Age of Onset   Kidney disease Mother    Heart attack Father    Kidney disease Father    Diabetes Sister    Hyperlipidemia Sister    Hypertension Sister    Kidney disease Sister        x2     Review of Systems  Positive ROS: as above  All other systems have been reviewed and were otherwise negative with the exception of those mentioned in the HPI and as  above.  Objective: Vital signs in last 24 hours: Temp:  [97.2 F (36.2 C)-97.4 F (36.3 C)] 97.4 F (36.3 C) (07/18 1155) Pulse Rate:  [76-79] 76 (07/18 1230) Resp:  [16-18] 16 (07/18 1230) BP: (135-156)/(71-92) 135/84 (07/18 1230) SpO2:  [95 %-99 %] 98 % (07/18 1230)  General Appearance: Alert, cooperative, no distress, appears stated age Head: Normocephalic, without obvious abnormality, atraumatic Eyes: PERRL, conjunctiva/corneas clear, EOM's intact, fundi benign, both eyes      Lungs: respirations unlabored Heart: Regular rate and rhythm Extremities: Extremities normal, atraumatic, no cyanosis or edema Pulses: 2+ and symmetric all extremities Skin: Skin color, texture, turgor normal, no rashes or lesions  NEUROLOGIC:   Mental status: A&O x4, no aphasia, good attention span, Memory and fund of knowledge Motor Exam - grossly normal, normal tone and bulk Sensory Exam - grossly normal Reflexes: symmetric, no pathologic reflexes, No  Hoffman's, No clonus Coordination - grossly normal Gait - not tested Balance - not tested Cranial Nerves: I: smell Not tested  II: visual acuity  OS: na    OD: na  II: visual fields Full to confrontation  II: pupils Equal, round, reactive to light  III,VII: ptosis None  III,IV,VI: extraocular muscles  Full ROM  V: mastication Normal  V: facial light touch sensation  Normal  V,VII: corneal reflex  Present  VII: facial muscle function - upper  Normal  VII: facial muscle function - lower Normal  VIII: hearing Not tested  IX: soft palate elevation  Normal  IX,X: gag reflex Present  XI: trapezius strength  5/5  XI: sternocleidomastoid strength 5/5  XI: neck flexion strength  5/5  XII: tongue strength  Normal    Data Review Lab Results  Component Value Date   WBC 3.9 (L) 08/02/2020   HGB 10.4 (L) 08/02/2020   HCT 31.9 (L) 08/02/2020   MCV 100.3 (H) 08/02/2020   PLT 196 08/02/2020   Lab Results  Component Value Date   NA 136  08/02/2020   K 4.7 08/02/2020   CL 97 (L) 08/02/2020   CO2 28 08/02/2020   BUN 37 (H) 08/02/2020   CREATININE 7.58 (H) 08/02/2020   GLUCOSE 101 (H) 08/02/2020   Lab Results  Component Value Date   INR 1.1 08/02/2020    Radiology: CT Head Wo Contrast  Result Date: 08/02/2020 CLINICAL DATA:  Headache after fall 1 month ago. EXAM: CT HEAD WITHOUT CONTRAST TECHNIQUE: Contiguous axial images were obtained from the base of the skull through the vertex without intravenous contrast. COMPARISON:  July 15, 2020. FINDINGS: Brain: Large left subdural hematoma is noted resulting in 4 mm of left-to-right midline shift. Maximum thickness is 11 mm. Small left tentorial subdural hematoma is noted as well. Ventricular size is within normal limits. Smaller right subdural hematoma is noted. Vascular: No hyperdense vessel or unexpected calcification. Skull: Normal. Negative for fracture or focal lesion. Sinuses/Orbits: No acute finding. Other: None. IMPRESSION: Large left subdural hematoma is noted resulting in 4 mm of left-to-right midline shift. Small left tentorial subdural hematoma is also noted, as well as smaller right subdural hematoma. Critical Value/emergent results were called by telephone at the time of interpretation on 08/02/2020 at 12:09 pm to provider [facility] , who verbally acknowledged these results. Electronically Signed   By: Marijo Conception M.D.   On: 08/02/2020 12:09     Assessment/Plan: 65 year old female presented to the ED today after having worsening headaches. CT head revealed a moderate left sided SDH with some midline shift and a small left tentorial SDH and small right SDH. It appears to be chronic/subacute. We will plan to take her to the OR this afternoon for a left sided craniotomy for evacuation of subdural hematoma . We discussed all the risks and benefits associated with the surgery. She understood and agrees to move forward.    Ocie Cornfield Rondle Lohse 08/02/2020 1:07 PM

## 2020-08-02 NOTE — ED Provider Notes (Signed)
[facility] EMERGENCY DEPARTMENT Provider Note   CSN: [id number] Arrival date & time: 08/02/20  0934     History Chief Complaint  Patient presents with   Headache    Karen [name] is a 65 y.o. female.  Patient with history of end-stage renal disease, on hemodialysis presents the emergency department for worsening headache.  Patient had a fall approximately 1 month ago.  At that time she had a minor headache and had a CT of the head performed which was negative.  This was in the end of June.  Patient has had minor headaches that come and go since that time however the past 2 days her headache has worsened to become more intense.  No additional injuries or falls.  Today at dialysis her headache was intense and she had an episode of vomiting.  She completed just over an hour for treatment and was sent to the emergency department by EMS.  Patient has been taking Tylenol over the past day.  She states that she feels unsteady with walking but is able to walk.  No vision loss.  EMS gave Zofran prior to arrival.  Patient is not on blood thinners daily but does receive heparin at dialysis.  The onset of this condition was acute. The course is worsening. Aggravating factors: movement and positioning. Alleviating factors: none.        Past Medical History:  Diagnosis Date   Anemia    Arthritis    knees   Breast cancer ([place]) 2019   Right Breast Cancer   Cancer [facility]    Chest pain Jan 2016   low risk Myoview    CHF (congestive heart failure) ([place])    Colitis 12/08/2014   [facility] focal moderate active colitis   Complication of anesthesia 2005   difficulty remembering for a while and waking up   Constipation    Dysrhythmia    h/o A-Fib   Elevated LFTs 2016   [facility]   ESRD on dialysis [facility]) April 2016   MWF [place] [facility]   Fundic gland polyps of stomach, benign    GERD (gastroesophageal reflux disease)    Heart murmur Nov 2015   Aortic scleosis-  no stenosis   Hemodialysis patient [facility])    Hypertension    Shortness of breath dyspnea    with exertion    Patient Active Problem List   Diagnosis Date Noted   Hyperlipidemia 02/20/2019   Acute idiopathic pericarditis    Chest pain 01/06/2018   S/P mastectomy, right 11/20/2017   Port-A-Cath in place 07/17/2017   Malignant neoplasm of upper-outer quadrant of right breast in female, estrogen receptor negative ([place]) 07/02/2017   Cough 03/06/2017   Elevated troponin I level 06/07/2015   Paroxysmal atrial fibrillation ([place]) 06/07/2015   Klebsiella pneumoniae sepsis ([place]) 12/22/2014   Sinus tachycardia 12/21/2014   Dyspnea 12/21/2014   Infection of urinary tract 12/20/2014   Sepsis ([place]) 12/19/2014   Fever, unspecified 12/17/2014   Thrush 12/17/2014   Erythema induratum 12/17/2014   Obesity, Class II, BMI 35.0-39.9, with comorbidity (see actual BMI) 11/19/2014   Bilateral lower extremity edema 04/28/2014   Chronic constipation 05/26/2013   Dyspepsia 08/08/2012   Chronic nausea 08/08/2012   History of chest pain 06/12/2012   End stage renal disease on dialysis [facility]) 07/04/2011    Past Surgical History:  Procedure Laterality Date   A/V FISTULAGRAM Right 04/22/2019   Procedure: A/V FISTULAGRAM - Right Upper;  Surgeon: Serafina Mitchell,  MD;  Location: [place] CV LAB;  Service: Cardiovascular;  Laterality: Right;   A/V FISTULAGRAM N/A 02/17/2020   Procedure: A/V FISTULAGRAM - Right Upper;  Surgeon: Serafina Mitchell, MD;  Location: [place] CV LAB;  Service: Cardiovascular;  Laterality: N/A;   ABDOMINAL HYSTERECTOMY  2005   AV FISTULA PLACEMENT Left 12/09/2013   Procedure: INSERTION OF ARTERIOVENOUS (AV) GORE-TEX GRAFT ARM;  Surgeon: Elam Dutch, MD;  Location: [place];  Service: Vascular;  Laterality: Left;   AV FISTULA PLACEMENT Right 01/01/2018   Procedure: INSERTION OF ARTERIOVENOUS (AV) GORE-TEX GRAFT ARM RIGHT ARM;  Surgeon: Elam Dutch, MD;  Location: [place];   Service: Vascular;  Laterality: Right;   AV FISTULA PLACEMENT Right 09/17/2018   Procedure: 1ST STAGE BASILIC ARTERIOVENOUS (AV) FISTULA CREATION RIGHT ARM;  Surgeon: Waynetta Sandy, MD;  Location: [place];  Service: Vascular;  Laterality: Right;   [place] Right 11/12/2018   Procedure: SECOND STAGE BASILIC VEIN TRANSPOSITION RIGHT ARM;  Surgeon: Waynetta Sandy, MD;  Location: [place];  Service: Vascular;  Laterality: Right;   BREAST BIOPSY  1990's   BUNIONECTOMY Bilateral    CHOLECYSTECTOMY  2005   COLONOSCOPY     FISTULOGRAM Right 06/12/2019   Procedure: Fistulogram of right upper arm arteriovenous fistula;  Surgeon: Serafina Mitchell, MD;  Location: [place] OR;  Service: Vascular;  Laterality: Right;   INSERTION OF ILIAC STENT  06/12/2019   Procedure: Insertion Of right basilic vein Stent;  Surgeon: Serafina Mitchell, MD;  Location: [place];  Service: Vascular;;   IR AV DIALY SHUNT INTRO NEEDLE/INTRACATH INITIAL W/PTA/IMG RIGHT Right 03/04/2019   IR GENERIC HISTORICAL  01/11/2016   IR US GUIDE VASC ACCESS RIGHT 01/11/2016 Corrie Mckusick, DO [facility]-INTERV RAD   IR GENERIC HISTORICAL  01/11/2016   IR RADIOLOGY PERIPHERAL GUIDED IV START 01/11/2016 Corrie Mckusick, DO [facility]INTER[facility]   IR IMAGING GUIDED PORT INSERTION  07/05/2017   IR REMOVAL TUN CV CATH W/O FL  08/12/2019   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   KNEE ARTHROSCOPY Bilateral    MASTECTOMY W/ SENTINEL NODE BIOPSY Right 11/20/2017   MASTECTOMY W/ SENTINEL NODE BIOPSY Right 11/20/2017   Procedure: RIGHT MASTECTOMY WITH SENTINEL LYMPH NODE BIOPSY;  Surgeon: Coralie Keens, MD;  Location: [place];  Service: General;  Laterality: Right;   PERIPHERAL VASCULAR BALLOON ANGIOPLASTY  02/17/2020   Procedure: PERIPHERAL VASCULAR BALLOON ANGIOPLASTY;  Surgeon: Serafina Mitchell, MD;  Location: [place] CV LAB;  Service: Cardiovascular;;   PERIPHERAL VASCULAR CATHETERIZATION N/A 08/31/2015   Procedure: A/V Shuntogram;  Surgeon:  Serafina Mitchell, MD;  Location: [place] CV LAB;  Service: Cardiovascular;  Laterality: N/A;   PERIPHERAL VASCULAR CATHETERIZATION Left 08/31/2015   Procedure: Peripheral Vascular Balloon Angioplasty;  Surgeon: Serafina Mitchell, MD;  Location: [place] CV LAB;  Service: Cardiovascular;  Laterality: Left;  arm fistula   PERIPHERAL VASCULAR INTERVENTION Right 04/22/2019   Procedure: PERIPHERAL VASCULAR INTERVENTION;  Surgeon: Serafina Mitchell, MD;  Location: [place] CV LAB;  Service: Cardiovascular;  Laterality: Right;  FISTULA   PORT-A-CATH REMOVAL N/A 08/12/2018   Procedure: PORT-A-CATH REMOVAL;  Surgeon: Coralie Keens, MD;  Location: [place];  Service: General;  Laterality: N/A;   [place] Right 12/24/2017   Procedure: UPPER EXTREMITY VENOGRAPHY CENTRAL VENOGRAM;  Surgeon: Waynetta Sandy, MD;  Location: [place] CV LAB;  Service: Cardiovascular;  Laterality: Right;  UPPER EXTREMITY VENOGRAPHY Bilateral 09/02/2018   Procedure: UPPER EXTREMITY VENOGRAPHY;  Surgeon: Waynetta Sandy, MD;  Location: [place] CV LAB;  Service: Cardiovascular;  Laterality: Bilateral;     OB History   No obstetric history on file.     Family History  Problem Relation Age of Onset   Kidney disease Mother    Heart attack Father    Kidney disease Father    Diabetes Sister    Hyperlipidemia Sister    Hypertension Sister    Kidney disease Sister        x2    Social History   Tobacco Use   Smoking status: Never   Smokeless tobacco: Never  Vaping Use   Vaping Use: Never used  Substance Use Topics   Alcohol use: No   Drug use: No    Home Medications Prior to Admission medications   Medication Sig Start Date End Date Taking? Authorizing Provider  acetaminophen (TYLENOL) 500 MG tablet Take 1,000 mg by mouth daily as needed for mild pain, moderate pain or headache.     [provider]  amoxicillin (AMOXIL) 500 MG  capsule Take 2,000 mg by mouth See admin instructions. Take 4 capsules (2000 mg) by mouth one hour prior to dental procedures    [provider]  clobetasol cream (TEMOVATE) 1.77 % Apply 1 application topically daily as needed (for rash).     [provider]  diltiazem (CARDIZEM) 30 MG tablet Take 1 tablet (30 mg total) by mouth every Monday, Wednesday, and Friday. 07/02/20   Lorretta Harp, MD  ethyl chloride spray Apply 1 application topically every Monday, Wednesday, and Friday with hemodialysis.    [provider]  fluticasone (FLONASE) 50 MCG/ACT nasal spray Place 1 spray into both nostrils daily as needed for allergies.    [provider]  HYDROcodone-acetaminophen (NORCO) 5-325 MG tablet Take 1 tablet by mouth every 6 (six) hours as needed for moderate pain. 11/12/18   Dagoberto Ligas, PA-C  lidocaine-prilocaine (EMLA) cream Apply 1 application topically See admin instructions. Apply topically Monday, Wednesday and Friday before dialysis 05/26/15   [provider]  midodrine (PROAMATINE) 10 MG tablet Take 20 mg by mouth every Monday, Wednesday, and Friday. Prior to dialysis 10/16/18   [provider]  multivitamin (RENA-VIT) TABS tablet Take 1 tablet by mouth daily.     [provider]  ondansetron (ZOFRAN) 8 MG tablet Take 8 mg by mouth every 8 (eight) hours as needed for nausea or vomiting.    [provider]  pantoprazole (PROTONIX) 40 MG tablet TAKE 1 TABLET (40 MG TOTAL) BY MOUTH DAILY. 03/08/20   Lorretta Harp, MD  prochlorperazine (COMPAZINE) 10 MG tablet Take 10 mg by mouth every 6 (six) hours as needed (Nausea or vomiting). 08/21/18 09/10/20  Nicholas Lose, MD  sevelamer carbonate (RENVELA) 800 MG tablet Take 1,600-2,400 mg by mouth See admin instructions. Take 3 tablets (2400 mg) by mouth with each meal & 2 tablets (1600 mg) by mouth with each snack.    [provider]  simvastatin (ZOCOR) 20 MG tablet  TAKE 1 TABLET BY MOUTH EVERYDAY AT BEDTIME 05/19/19   Lorretta Harp, MD    Allergies    Patient has no known allergies.  Review of Systems   Review of Systems  Constitutional:  Negative for fever.  HENT:  Negative for congestion, dental problem, rhinorrhea and sinus pressure.   Eyes:  Negative for photophobia, discharge, redness and visual disturbance.  Respiratory:  Negative for shortness of breath.   Cardiovascular:  Negative for chest pain.  Gastrointestinal:  Positive for nausea and vomiting.  Musculoskeletal:  Negative for gait problem, neck pain and neck stiffness.  Skin:  Negative for rash.  Neurological:  Positive for headaches. Negative for syncope, speech difficulty, weakness, light-headedness and numbness.  Psychiatric/Behavioral:  Negative for confusion.    Physical Exam Updated Vital Signs BP (!) 156/92 (BP Location: Left Arm)   Pulse 79   Temp (!) 97.2 F (36.2 C) (Oral)   Resp 16   LMP  (LMP Unknown)   SpO2 95%   Physical Exam Vitals and nursing note reviewed.  Constitutional:      General: She is in acute distress (appears uncomfortable, wincing at times, holding the left side of her head with her hand).     Appearance: She is well-developed.  HENT:     Head: Normocephalic and atraumatic.     Right Ear: Tympanic membrane, ear canal and external ear normal.     Left Ear: Tympanic membrane, ear canal and external ear normal.     Nose: Nose normal.     Mouth/Throat:     Pharynx: Uvula midline.  Eyes:     General: Lids are normal.     Extraocular Movements:     Right eye: No nystagmus.     Left eye: No nystagmus.     Conjunctiva/sclera: Conjunctivae normal.     Pupils: Pupils are equal, round, and reactive to light.  Cardiovascular:     Rate and Rhythm: Normal rate and regular rhythm.  Pulmonary:     Effort: Pulmonary effort is normal.     Breath sounds: Normal breath sounds.  Abdominal:     Palpations: Abdomen is soft.     Tenderness: There is no  abdominal tenderness.  Musculoskeletal:     Cervical back: Normal range of motion and neck supple. No tenderness or bony tenderness.  Skin:    General: Skin is warm and dry.  Neurological:     Mental Status: She is alert and oriented to person, place, and time.     GCS: GCS eye subscore is 4. GCS verbal subscore is 5. GCS motor subscore is 6.     Cranial Nerves: No cranial nerve deficit.     Sensory: No sensory deficit.     Motor: No weakness.     Coordination: Coordination normal.     Gait: Gait normal.     Comments: Upper extremity myotomes tested bilaterally:  C5 Shoulder abduction 5/5 C6 Elbow flexion/wrist extension 5/5 C7 Elbow extension 5/5 C8 Finger flexion 5/5 T1 Finger abduction 5/5  Lower extremity myotomes tested bilaterally: L2 Hip flexion 5/5 L3 Knee extension 5/5 L4 Ankle dorsiflexion 5/5 S1 Ankle plantar flexion 5/5     ED Results / Procedures / Treatments   Labs (all labs ordered are listed, but only abnormal results are displayed) Labs Reviewed  CBC - Abnormal; Notable for the following components:      Result Value   WBC 3.9 (*)    RBC 3.18 (*)    Hemoglobin 10.4 (*)    HCT 31.9 (*)    MCV 100.3 (*)    All other components within normal limits  BASIC METABOLIC PANEL - Abnormal; Notable for the following components:   Chloride 97 (*)    Glucose, Bld 101 (*)    BUN 37 (*)    Creatinine, Ser 7.58 (*)    GFR, Estimated 6 (*)  All other components within normal limits  RESP PANEL BY RT-PCR (FLU A&B, COVID) ARPGX2  PROTIME-INR    EKG None  Radiology CT Head Wo Contrast  Result Date: 08/02/2020 CLINICAL DATA:  Headache after fall 1 month ago. EXAM: CT HEAD WITHOUT CONTRAST TECHNIQUE: Contiguous axial images were obtained from the base of the skull through the vertex without intravenous contrast. COMPARISON:  July 15, 2020. FINDINGS: Brain: Large left subdural hematoma is noted resulting in 4 mm of left-to-right midline shift. Maximum thickness is  11 mm. Small left tentorial subdural hematoma is noted as well. Ventricular size is within normal limits. Smaller right subdural hematoma is noted. Vascular: No hyperdense vessel or unexpected calcification. Skull: Normal. Negative for fracture or focal lesion. Sinuses/Orbits: No acute finding. Other: None. IMPRESSION: Large left subdural hematoma is noted resulting in 4 mm of left-to-right midline shift. Small left tentorial subdural hematoma is also noted, as well as smaller right subdural hematoma. Critical Value/emergent results were called by telephone at the time of interpretation on 08/02/2020 at 12:09 pm to provider [facility] , who verbally acknowledged these results. Electronically Signed   By: Marijo Conception M.D.   On: 08/02/2020 12:09    Procedures Procedures   Medications Ordered in ED Medications - No data to display  ED Course  I have reviewed the triage vital signs and the nursing notes.  Pertinent labs & imaging results that were available during my care of the patient were reviewed by me and considered in my medical decision making (see chart for details).  Patient seen and examined. Work-up initiated. Medications ordered.   Vital signs reviewed and are as follows: BP (!) 156/92 (BP Location: Left Arm)   Pulse 79   Temp (!) 97.2 F (36.2 C) (Oral)   Resp 16   LMP  (LMP Unknown)   SpO2 95%   12:40 PM Spoke with radiology about subdural hematomas that have collected since 07/15/20.   Requested consult from neurosurgery who will see patient.   Patient updated on results.  She is sleepy after receiving Reglan and Benadryl (5 mg / 12.5 mg).  She continues to be awake and alert, conversant, properly oriented.  She moves her extremities without difficulty.  1:45 PM neurosurgery is admitting.  Plan for surgical decompression today.  Spoke with neurosurgery APP.  They will arrange for internal medicine and nephrology consults.  CRITICAL CARE Performed by: Carlisle Cater  PA-C Total critical care time: 35 minutes Critical care time was exclusive of separately billable procedures and treating other patients. Critical care was necessary to treat or prevent imminent or life-threatening deterioration. Critical care was time spent personally by me on the following activities: development of treatment plan with patient and/or surrogate as well as nursing, discussions with consultants, evaluation of patient's response to treatment, examination of patient, obtaining history from patient or surrogate, ordering and performing treatments and interventions, ordering and review of laboratory studies, ordering and review of radiographic studies, pulse oximetry and re-evaluation of patient's condition.     MDM Rules/Calculators/A&P                          Admit.   Final Clinical Impression(s) / ED Diagnoses Final diagnoses:  Subdural hematoma [facility])    Rx / DC Orders ED Discharge Orders     None        Carlisle Cater, PA-C 08/02/20 [place], Alvin Critchley, DO 08/03/20 1718

## 2020-08-02 NOTE — ED Notes (Signed)
Surgical consent was signed by the patient. Awaiting Dr Ronnald Ramp team signatures.

## 2020-08-02 NOTE — ED Notes (Signed)
Per EDP, will wait for provider to evaluate in room. No CT ordered in triage at this time.

## 2020-08-02 NOTE — Op Note (Signed)
08/02/2020  4:17 PM  PATIENT:  Karen [name]  65 y.o. 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 Imaging showed acute on chronic subdural hematoma on the left with midline shift. Pain was debilitating. Recommended left craniotomy for evacuation of subdural hematoma. Patient understood the risks, benefits, and alternatives and potential outcomes and wished to proceed.  PROCEDURE DETAILS: The patient was taken to the operating room and after induction of adequate generalized endotracheal anesthesia, the head was affixed in a 3 point [place] head rest, and turned to the right to expose the left  frontotemporal parietal region. The head was shaved and then cleaned and then prepped with DuraPrep and draped in the usual sterile fashion. 10 cc of local anesthetic was injected, and a lazy S incision was made on the left of the head. Raney clips were placed to establish hemostasis of the scalp, the muscle was reflected with the scalp flap, to expose the cranium. A burr hole was placed, and a craniotomy flap was turned utilizing the high-speed, air poweedr drill. The flap was then placed in bacitracin-containing saline solution, and the dura was opened to expose chronic subdural hematoma. A hematoma was then removed with a combination of irrigation and suction. I continued to irrigate until the irrigant was clear to, and dried any bleeding with bipolar cautery. I then placed a subdural drain through separate stab incision and close the dura with a running 4-0 Nurolon suture.  Dural tack up sutures were placed. The dura was lined with Gelfoam, and the craniotomy flap was replaced with doggie-bone plates. The wound was copiously irrigated. the galea was then closed with interrupted 2-0 Vicryl suture. The skin was then closed with staples a sterile dressing was applied. The patient was then taken out of the 3-point May[place] headrest and awakened from general anesthesia, and transported to the recovery room in stable condition. At the end of the procedure all sponge, needle, and instrument counts were correct.    PLAN OF CARE: Admit to inpatient   PATIENT DISPOSITION:  PACU - hemodynamically stable.   Delay start of Pharmacological VTE agent (>24hrs) due to surgical blood loss or risk of bleeding:  yes

## 2020-08-02 NOTE — Anesthesia Preprocedure Evaluation (Addendum)
Anesthesia Evaluation  Patient identified by MRN, date of birth, ID band Patient awake    Reviewed: Allergy & Precautions, NPO status , Patient's Chart, lab work & pertinent test results  Airway Mallampati: III  TM Distance: >3 FB Neck ROM: Full    Dental  (+) Partial Lower, Dental Advisory Given   Pulmonary shortness of breath,    Pulmonary exam normal breath sounds clear to auscultation       Cardiovascular hypertension, +CHF (on midodrine)  Normal cardiovascular exam Rhythm:Regular Rate:Normal  TTE 2020 1. The left ventricle has normal systolic function with an ejection  fraction of 60-65%. The cavity size was normal. There is mildly increased  left ventricular wall thickness. Left ventricular diastolic parameters  were normal.  2. The right ventricle has normal systolic function. The cavity was  normal. There is no increase in right ventricular wall thickness. Right  ventricular systolic pressure is mildly elevated with an estimated  pressure of 38.5 mmHg.  3. Left atrial size was mildly dilated.  4. The mitral valve is normal in structure.  5. The tricuspid valve is normal in structure. Tricuspid valve  regurgitation is mild-moderate.  6. The aortic valve is tricuspid Mild thickening of the aortic valve.  7. The pulmonic valve was normal in structure.  8. Normal LV function; mild LVH; mild LAE; mild to moderate TR; mildly  elevated pulmonary pressure.   Stress Test 2021 Normal resting and stress perfusion. No ischemia or infarction EF 72%    Neuro/Psych negative neurological ROS  negative psych ROS   GI/Hepatic Neg liver ROS, GERD  ,  Endo/Other  negative endocrine ROS  Renal/GU ESRF and DialysisRenal disease (dialysis MWF, Cr 7.58, K 4.7)  negative genitourinary   Musculoskeletal negative musculoskeletal ROS (+)   Abdominal   Peds  Hematology  (+) Blood dyscrasia (Hgb 10.4), anemia ,    Anesthesia Other Findings Presented to the ED from diaylsis where she had a worsening headache and vomiting. Fell 1 month ago. CT shows large left subdural hematoma is noted resulting in 4 mm of left-to-right midline shift. Now presents for emergent craniotomy.   Reproductive/Obstetrics                            Anesthesia Physical Anesthesia Plan  ASA: 3 and emergent  Anesthesia Plan: General   Post-op Pain Management:    Induction: Intravenous and Rapid sequence  PONV Risk Score and Plan: 3 and Dexamethasone, Ondansetron and Treatment may vary due to age or medical condition  Airway Management Planned: Oral ETT  Additional Equipment: Arterial line  Intra-op Plan:   Post-operative Plan: Extubation in OR  Informed Consent: I have reviewed the patients History and Physical, chart, labs and discussed the procedure including the risks, benefits and alternatives for the proposed anesthesia with the patient or authorized representative who has indicated his/her understanding and acceptance.     Dental advisory given  Plan Discussed with: CRNA  Anesthesia Plan Comments:         Anesthesia Quick Evaluation

## 2020-08-03 ENCOUNTER — Inpatient Hospital Stay (HOSPITAL_COMMUNITY): Payer: Medicare Other

## 2020-08-03 ENCOUNTER — Encounter (HOSPITAL_COMMUNITY): Payer: Self-pay | Admitting: Neurological Surgery

## 2020-08-03 LAB — CBC
HCT: 33.1 % — ABNORMAL LOW (ref 36.0–46.0)
Hemoglobin: 11 g/dL — ABNORMAL LOW (ref 12.0–15.0)
MCH: 32.9 pg (ref 26.0–34.0)
MCHC: 33.2 g/dL (ref 30.0–36.0)
MCV: 99.1 fL (ref 80.0–100.0)
Platelets: 199 10*3/uL (ref 150–400)
RBC: 3.34 MIL/uL — ABNORMAL LOW (ref 3.87–5.11)
RDW: 13.3 % (ref 11.5–15.5)
WBC: 7.2 10*3/uL (ref 4.0–10.5)
nRBC: 0 % (ref 0.0–0.2)

## 2020-08-03 LAB — HEPATITIS B SURFACE ANTIGEN: Hepatitis B Surface Ag: NONREACTIVE

## 2020-08-03 LAB — BASIC METABOLIC PANEL
Anion gap: 14 (ref 5–15)
BUN: 45 mg/dL — ABNORMAL HIGH (ref 8–23)
CO2: 24 mmol/L (ref 22–32)
Calcium: 9.2 mg/dL (ref 8.9–10.3)
Chloride: 98 mmol/L (ref 98–111)
Creatinine, Ser: 9.04 mg/dL — ABNORMAL HIGH (ref 0.44–1.00)
GFR, Estimated: 4 mL/min — ABNORMAL LOW (ref >60)
Glucose, Bld: 116 mg/dL — ABNORMAL HIGH (ref 70–99)
Potassium: 6.2 mmol/L — ABNORMAL HIGH (ref 3.5–5.1)
Sodium: 136 mmol/L (ref 135–145)

## 2020-08-03 MED ORDER — PENTAFLUOROPROP-TETRAFLUOROETH EX AERO: 1.0000 "application " | INHALATION_SPRAY | CUTANEOUS | Status: DC | PRN

## 2020-08-03 MED ORDER — CHLORHEXIDINE GLUCONATE CLOTH 2 % EX PADS
6.0000 | MEDICATED_PAD | Freq: Every day | CUTANEOUS | Status: DC
  Administered 2020-08-03 – 2020-08-07 (×5): 6 via TOPICAL

## 2020-08-03 MED ORDER — LIDOCAINE-PRILOCAINE 2.5-2.5 % EX CREA: 1.0000 "application " | TOPICAL_CREAM | CUTANEOUS | Status: DC | PRN

## 2020-08-03 MED ORDER — LEVETIRACETAM 250 MG PO TABS
250.0000 mg | ORAL_TABLET | Freq: Two times a day (BID) | ORAL | Status: DC
  Administered 2020-08-03 – 2020-08-07 (×9): 250 mg via ORAL
  Filled 2020-08-03 (×10): qty 1

## 2020-08-03 MED ORDER — LIDOCAINE HCL (PF) 1 % IJ SOLN: 5.0000 mL | INTRAMUSCULAR | Status: DC | PRN

## 2020-08-03 MED ORDER — SODIUM CHLORIDE 0.9 % IV SOLN: 100.0000 mL | INTRAVENOUS | Status: DC | PRN

## 2020-08-03 MED ORDER — DEXAMETHASONE 4 MG PO TABS
2.0000 mg | ORAL_TABLET | Freq: Four times a day (QID) | ORAL | Status: DC
  Administered 2020-08-03 – 2020-08-04 (×4): 2 mg via ORAL
  Filled 2020-08-03 (×4): qty 1

## 2020-08-03 NOTE — Progress Notes (Signed)
Inpatient Rehab Admissions Coordinator Note:   Per therapy recommendations, pt was screened for CIR candidacy by Shann Medal, PT, DPT.  At this time we are recommending a CIR consult and I will place an order per our protocol.  Please contact me with questions.   Shann Medal, PT, DPT [phone] 08/03/20 9:53 AM

## 2020-08-03 NOTE — Progress Notes (Signed)
PT Cancellation Note  Patient Details Name: Montia Haslip MRN: [id number] DOB: 17-Jun-1955   Cancelled Treatment:    Reason Eval/Treat Not Completed: Active bedrest order  Wyona Almas, PT, DPT Acute Rehabilitation Services Pager [phone] Office [phone]    Deno Etienne 08/03/2020, 7:58 AM

## 2020-08-03 NOTE — Progress Notes (Signed)
Inpatient Rehabilitation Admissions Coordinator   Inpatient rehab consult received. I met with patient at bedside for assessment. 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 Her son and granddaughter are currently providing his care. She would like to see how she progresses to then be able to determine her needs for rehab venues, but would like to return home as soon as able.  I will follow.  Danne Baxter, RN, MSN Rehab Admissions Coordinator [phone] 08/03/2020 12:13 PM

## 2020-08-03 NOTE — Consult Note (Signed)
[facility] Renal Consultation Note    Indication for Consultation:  Management of ESRD/hemodialysis; anemia, hypertension/volume and secondary hyperparathyroidism  HPI: Karen [name] is a 65 y.o. female with a PMH significant for HTN, CHF, breast cancer, ESRD on HD MWF at [facility] who was brought via EMS from dialysis with worsening headache.  She reports a fall almost a month ago where she hit her head on her piano after falling from standing on her couch to clear cobwebs.  She did not lose consciousness but was a significant blow.  She reports waxing and waning symptoms of HA and dizziness since the injury.  She did tell the PCT at the HD unit about the fall and did not get heparin with HD that day but she did not remind them and has been getting heparin since.  She mentioned the HA and fall to her PCP who ordered a CT of the brain 07/16/20 and there was no acute abnormality noted.  In the ED she was uncomfortable, vital signs stable, however repeat CT scan of the head revealed a new large left subdural hematoma with midline shift and small left tentorial subdural hematoma.  Neurosurgery was consulted and she underwent urgent left parietal craniotomy and evacuation of subdural hematoma.  We were consulted to provide dialysis during her hospitalization.   Past Medical History:  Diagnosis Date   Anemia    Arthritis    knees   Breast cancer ([place]) 2019   Right Breast Cancer   Cancer [facility])    Chest pain Jan 2016   low risk Myoview    CHF (congestive heart failure) ([place])    Colitis 12/08/2014   [facility]- focal moderate active colitis   Complication of anesthesia 2005   difficulty remembering for a while and waking up   Constipation    Dysrhythmia    h/o A-Fib   Elevated LFTs 2016   [facility]   ESRD on dialysis [facility]) April 2016   MWF [place]   Fundic gland polyps of stomach, benign    GERD (gastroesophageal reflux disease)    Heart murmur Nov 2015   Aortic  scleosis- no stenosis   Hemodialysis patient [facility])    Hypertension    Shortness of breath dyspnea    with exertion   Past Surgical History:  Procedure Laterality Date   A/V FISTULAGRAM Right 04/22/2019   Procedure: A/V FISTULAGRAM - Right Upper;  Surgeon: Serafina Mitchell, MD;  Location: [place] CV LAB;  Service: Cardiovascular;  Laterality: Right;   A/V FISTULAGRAM N/A 02/17/2020   Procedure: A/V FISTULAGRAM - Right Upper;  Surgeon: Serafina Mitchell, MD;  Location: [place] CV LAB;  Service: Cardiovascular;  Laterality: N/A;   ABDOMINAL HYSTERECTOMY  2005   AV FISTULA PLACEMENT Left 12/09/2013   Procedure: INSERTION OF ARTERIOVENOUS (AV) GORE-TEX GRAFT ARM;  Surgeon: Elam Dutch, MD;  Location: [place];  Service: Vascular;  Laterality: Left;   AV FISTULA PLACEMENT Right 01/01/2018   Procedure: INSERTION OF ARTERIOVENOUS (AV) GORE-TEX GRAFT ARM RIGHT ARM;  Surgeon: Elam Dutch, MD;  Location: [place];  Service: Vascular;  Laterality: Right;   AV FISTULA PLACEMENT Right 09/17/2018   Procedure: 1ST STAGE BASILIC ARTERIOVENOUS (AV) FISTULA CREATION RIGHT ARM;  Surgeon: Waynetta Sandy, MD;  Location: [place];  Service: Vascular;  Laterality: Right;   [place] Right 11/12/2018   Procedure: SECOND STAGE BASILIC VEIN TRANSPOSITION RIGHT ARM;  Surgeon: Waynetta Sandy, MD;  [facility];  Service: Vascular;  Laterality: Right;   BREAST BIOPSY  1990's   BUNIONECTOMY Bilateral    CHOLECYSTECTOMY  2005   COLONOSCOPY     FISTULOGRAM Right 06/12/2019   Procedure: Fistulogram of right upper arm arteriovenous fistula;  Surgeon: Serafina Mitchell, MD;  Location: [facility] OR;  Service: Vascular;  Laterality: Right;   INSERTION OF ILIAC STENT  06/12/2019   Procedure: Insertion Of right basilic vein Stent;  Surgeon: Serafina Mitchell, MD;  Location: [place];  Service: Vascular;;   IR AV DIALY SHUNT INTRO NEEDLE/INTRACATH INITIAL W/PTA/IMG RIGHT Right 03/04/2019   IR  GENERIC HISTORICAL  01/11/2016   IR US GUIDE VASC ACCESS RIGHT 01/11/2016 Corrie Mckusick, DO [facility]-INTERV RAD   IR GENERIC HISTORICAL  01/11/2016   IR RADIOLOGY PERIPHERAL GUIDED IV START 01/11/2016 Corrie Mckusick, DO [facility]INTER[facility]   IR IMAGING GUIDED PORT INSERTION  07/05/2017   IR REMOVAL TUN CV CATH W/O FL  08/12/2019   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   KNEE ARTHROSCOPY Bilateral    MASTECTOMY W/ SENTINEL NODE BIOPSY Right 11/20/2017   MASTECTOMY W/ SENTINEL NODE BIOPSY Right 11/20/2017   Procedure: RIGHT MASTECTOMY WITH SENTINEL LYMPH NODE BIOPSY;  Surgeon: Coralie Keens, MD;  Location: [place];  Service: General;  Laterality: Right;   PERIPHERAL VASCULAR BALLOON ANGIOPLASTY  02/17/2020   Procedure: PERIPHERAL VASCULAR BALLOON ANGIOPLASTY;  Surgeon: Serafina Mitchell, MD;  Location: [place] CV LAB;  Service: Cardiovascular;;   PERIPHERAL VASCULAR CATHETERIZATION N/A 08/31/2015   Procedure: A/V Shuntogram;  Surgeon: Serafina Mitchell, MD;  Location: [place] CV LAB;  Service: Cardiovascular;  Laterality: N/A;   PERIPHERAL VASCULAR CATHETERIZATION Left 08/31/2015   Procedure: Peripheral Vascular Balloon Angioplasty;  Surgeon: Serafina Mitchell, MD;  Location: [place] CV LAB;  Service: Cardiovascular;  Laterality: Left;  arm fistula   PERIPHERAL VASCULAR INTERVENTION Right 04/22/2019   Procedure: PERIPHERAL VASCULAR INTERVENTION;  Surgeon: Serafina Mitchell, MD;  Location: [place] CV LAB;  Service: Cardiovascular;  Laterality: Right;  FISTULA   PORT-A-CATH REMOVAL N/A 08/12/2018   Procedure: PORT-A-CATH REMOVAL;  Surgeon: Coralie Keens, MD;  Location: [place];  Service: General;  Laterality: N/A;   [place] Right 12/24/2017   Procedure: UPPER EXTREMITY VENOGRAPHY CENTRAL VENOGRAM;  Surgeon: Waynetta Sandy, MD;  Location: [place] CV LAB;  Service: Cardiovascular;  Laterality: Right;   UPPER EXTREMITY VENOGRAPHY Bilateral  09/02/2018   Procedure: UPPER EXTREMITY VENOGRAPHY;  Surgeon: Waynetta Sandy, MD;  Location: [place] CV LAB;  Service: Cardiovascular;  Laterality: Bilateral;   Family History:   Family History  Problem Relation Age of Onset   Kidney disease Mother    Heart attack Father    Kidney disease Father    Diabetes Sister    Hyperlipidemia Sister    Hypertension Sister    Kidney disease Sister        x2   Social History:  reports that she has never smoked. She has never used smokeless tobacco. She reports that she does not drink alcohol and does not use drugs. No Known Allergies Prior to Admission medications   Medication Sig Start Date End Date Taking? Authorizing Provider  acetaminophen (TYLENOL) 500 MG tablet Take 1,000 mg by mouth daily as needed for mild pain, moderate pain or headache.    Yes [provider]  amoxicillin (AMOXIL) 500 MG capsule Take 2,000 mg by mouth See admin  instructions. Take 4 capsules (2000 mg) by mouth one hour prior to dental procedures   Yes [provider]  clobetasol cream (TEMOVATE) 5.42 % Apply 1 application topically daily as needed (for rash).    Yes [provider]  diltiazem (CARDIZEM) 30 MG tablet Take 1 tablet (30 mg total) by mouth every Monday, Wednesday, and Friday. 07/02/20  Yes Lorretta Harp, MD  ethyl chloride spray Apply 1 application topically every Monday, Wednesday, and Friday with hemodialysis.   Yes [provider]  fluticasone (FLONASE) 50 MCG/ACT nasal spray Place 1 spray into both nostrils daily as needed for allergies.   Yes [provider]  lidocaine-prilocaine (EMLA) cream Apply 1 application topically See admin instructions. Apply topically Monday, Wednesday and Friday before dialysis 05/26/15  Yes [provider]  midodrine (PROAMATINE) 10 MG tablet Take 20 mg by mouth every Monday, Wednesday, and Friday. Prior to dialysis 10/16/18  Yes [provider]   multivitamin (RENA-VIT) TABS tablet Take 1 tablet by mouth daily.    Yes [provider]  ondansetron (ZOFRAN) 8 MG tablet Take 8 mg by mouth every 8 (eight) hours as needed for nausea or vomiting.   Yes [provider]  pantoprazole (PROTONIX) 40 MG tablet TAKE 1 TABLET (40 MG TOTAL) BY MOUTH DAILY. Patient taking differently: Take 40 mg by mouth daily. 03/08/20  Yes Lorretta Harp, MD  prochlorperazine (COMPAZINE) 10 MG tablet Take 10 mg by mouth every 6 (six) hours as needed (Nausea or vomiting). 08/21/18 09/10/20 Yes Nicholas Lose, MD  sevelamer carbonate (RENVELA) 800 MG tablet Take 1,600-2,400 mg by mouth See admin instructions. Take 3 tablets (2400 mg) by mouth with each meal & 2 tablets (1600 mg) by mouth with each snack.   Yes [provider]  simvastatin (ZOCOR) 20 MG tablet TAKE 1 TABLET BY MOUTH EVERYDAY AT BEDTIME Patient taking differently: Take 20 mg by mouth daily at 6 PM. 05/19/19  Yes Lorretta Harp, MD  HYDROcodone-acetaminophen (NORCO) 5-325 MG tablet Take 1 tablet by mouth every 6 (six) hours as needed for moderate pain. Patient not taking: Reported on 08/03/2020 11/12/18   Dagoberto Ligas, PA-C   Current Facility-Administered Medications  Medication Dose Route Frequency Provider Last Rate Last Admin   acetaminophen (TYLENOL) tablet 650 mg  650 mg Oral Q4H PRN Eustace Moore, MD       Or   acetaminophen (TYLENOL) suppository 650 mg  650 mg Rectal Q4H PRN Eustace Moore, MD       Chlorhexidine Gluconate Cloth 2 % PADS 6 each  6 each Topical Daily Eustace Moore, MD   6 each at 08/02/20 2204   Chlorhexidine Gluconate Cloth 2 % PADS 6 each  6 each Topical Q0600 Loren Racer, PA-C       dexamethasone (DECADRON) tablet 2 mg  2 mg Oral Q6H Eustace Moore, MD       diltiazem (CARDIZEM) tablet 30 mg  30 mg Oral Q M,W,F Eustace Moore, MD   30 mg at 08/02/20 2206   HYDROcodone-acetaminophen (NORCO/VICODIN) 5-325 MG per tablet 1 tablet  1 tablet Oral  Q6H PRN Eustace Moore, MD   1 tablet at 08/03/20 0031   labetalol (NORMODYNE) injection 10-40 mg  10-40 mg Intravenous Q10 min PRN Eustace Moore, MD       levETIRAcetam (KEPPRA) tablet 250 mg  250 mg Oral BID Eustace Moore, MD   250 mg at 08/03/20 1040  lidocaine-prilocaine (EMLA) cream 1 application  1 application Topical See admin instructions Eustace Moore, MD       midodrine (PROAMATINE) tablet 20 mg  20 mg Oral Q M,W,F Eustace Moore, MD       morphine 2 MG/ML injection 1-2 mg  1-2 mg Intravenous Q2H PRN Eustace Moore, MD       multivitamin (RENA-VIT) tablet 1 tablet  1 tablet Oral Daily Eustace Moore, MD   1 tablet at 08/03/20 1038   ondansetron (ZOFRAN) tablet 4 mg  4 mg Oral Q4H PRN Eustace Moore, MD       Or   ondansetron [facility]) injection 4 mg  4 mg Intravenous Q4H PRN Eustace Moore, MD       ondansetron [facility]) tablet 8 mg  8 mg Oral Q8H PRN Eustace Moore, MD       pantoprazole (PROTONIX) EC tablet 40 mg  40 mg Oral Daily Eustace Moore, MD   40 mg at 08/03/20 1037   pantoprazole (PROTONIX) injection 40 mg  40 mg Intravenous QHS Eustace Moore, MD       promethazine (PHENERGAN) tablet 12.5-25 mg  12.5-25 mg Oral Q4H PRN Eustace Moore, MD       senna [facility]) tablet 8.6 mg  1 tablet Oral BID Eustace Moore, MD   8.6 mg at 08/03/20 1037   sevelamer carbonate (RENVELA) tablet 2,400 mg  2,400 mg Oral TID WC Eustace Moore, MD   2,400 mg at 08/03/20 6226   And   sevelamer carbonate (RENVELA) tablet 1,600 mg  1,600 mg Oral PRN Eustace Moore, MD       Labs: Basic Metabolic Panel: Recent Labs  Lab 08/02/20 0942 08/03/20 0245  NA 136 136  K 4.7 6.2*  CL 97* 98  CO2 28 24  GLUCOSE 101* 116*  BUN 37* 45*  CREATININE 7.58* 9.04*  CALCIUM 8.9 9.2   Liver Function Tests: No results for input(s): AST, ALT, ALKPHOS, BILITOT, PROT, ALBUMIN in the last 168 hours. No results for input(s): LIPASE, AMYLASE in the last 168 hours. No results for input(s): AMMONIA in the last  168 hours. CBC: Recent Labs  Lab 08/02/20 0942 08/03/20 0245  WBC 3.9* 7.2  HGB 10.4* 11.0*  HCT 31.9* 33.1*  MCV 100.3* 99.1  PLT 196 199   Cardiac Enzymes: No results for input(s): CKTOTAL, CKMB, CKMBINDEX, TROPONINI in the last 168 hours. CBG: No results for input(s): GLUCAP in the last 168 hours. Iron Studies: No results for input(s): IRON, TIBC, TRANSFERRIN, FERRITIN in the last 72 hours. Studies/Results: CT HEAD WO CONTRAST  Result Date: 08/03/2020 CLINICAL DATA:  Subdural hemorrhage follow-up. EXAM: CT HEAD WITHOUT CONTRAST TECHNIQUE: Contiguous axial images were obtained from the base of the skull through the vertex without intravenous contrast. COMPARISON:  08/02/2020. FINDINGS: Brain: Status post left frontal craniotomy for subdural hematoma evacuation and placement of a drain. The drain tip is within the posterior aspect of the left convexity subdural hematoma. Suspected Gel-Foam subjacent to the craniotomy defect. The left convexity hematoma is overall decreased in volume with similar maximal thickness of 11 mm. Some areas of new internal hyperdensity may represent interval hemorrhage. Similar hemorrhage layering along the left tentorial leaflet. Increased pneumocephalus subjacent to the craniotomy and along the anti dependent left frontal convexity. Midline shift is improved, now 2 mm at the foramen of Monro. No hydrocephalus. No evidence of acute large vascular territory infarct. Similar smaller (up  to 6 mm thickness) lower density right subdural collection. No mass lesion. Partially empty sella. Vascular: Calcific atherosclerosis. No hyperdense vessel identified. Skull: Left frontal craniotomy. Sinuses/Orbits: Clear sinuses.  Unremarkable orbits. Other: No mastoid effusions. IMPRESSION: 1. Status post left frontal craniotomy for subdural hematoma evacuation and placement of a drain. The left convexity hematoma is overall decreased in volume with similar maximal thickness of 11 mm.  Some areas of new internal hyperdensity may represent interval hemorrhage and warrant continued attention on follow-up. 2. Improved mass effect with 2 mm of rightward midline shift at the foramen of Monro, previously 4 mm. 3. Similar hemorrhage layering along the left tentorial leaflet and similar smaller (up to 6 mm thickness) right subdural hematoma. Electronically Signed   By: Margaretha Sheffield MD   On: 08/03/2020 08:11   CT Head Wo Contrast  Result Date: 08/02/2020 CLINICAL DATA:  Headache after fall 1 month ago. EXAM: CT HEAD WITHOUT CONTRAST TECHNIQUE: Contiguous axial images were obtained from the base of the skull through the vertex without intravenous contrast. COMPARISON:  July 15, 2020. FINDINGS: Brain: Large left subdural hematoma is noted resulting in 4 mm of left-to-right midline shift. Maximum thickness is 11 mm. Small left tentorial subdural hematoma is noted as well. Ventricular size is within normal limits. Smaller right subdural hematoma is noted. Vascular: No hyperdense vessel or unexpected calcification. Skull: Normal. Negative for fracture or focal lesion. Sinuses/Orbits: No acute finding. Other: None. IMPRESSION: Large left subdural hematoma is noted resulting in 4 mm of left-to-right midline shift. Small left tentorial subdural hematoma is also noted, as well as smaller right subdural hematoma. Critical Value/emergent results were called by telephone at the time of interpretation on 08/02/2020 at 12:09 pm to provider [facility] , who verbally acknowledged these results. Electronically Signed   By: Marijo Conception M.D.   On: 08/02/2020 12:09    ROS: Pertinent items are noted in HPI. Physical Exam: Vitals:   08/03/20 0400 08/03/20 0500 08/03/20 0600 08/03/20 0810  BP: 126/73 119/77 130/83   Pulse: 78 78 75   Resp: 20 19 14    Temp: 98 F (36.7 C)   98.7 F (37.1 C)  TempSrc: Oral   Oral  SpO2: 94% 94% 94%   Weight:      Height:          Weight change:   Intake/Output  Summary (Last 24 hours) at 08/03/2020 1123 Last data filed at 08/03/2020 0600 Gross per 24 hour  Intake 1249.1 ml  Output 280 ml  Net 969.1 ml   BP 130/83   Pulse 75   Temp 98.7 F (37.1 C) (Oral)   Resp 14   Ht 5\' 1"  (1.549 m)   Wt 89 kg   LMP  (LMP Unknown)   SpO2 94%   BMI 37.07 kg/m  General appearance: alert, cooperative, and no distress Head: Normocephalic, without obvious abnormality, s/p left craniotomy and drain placement Resp: clear to auscultation bilaterally Cardio: regular rate and rhythm, S1, S2 normal, no murmur, click, rub or gallop GI: soft, non-tender; bowel sounds normal; no masses,  no organomegaly Extremities: extremities normal, atraumatic, no cyanosis or edema and RUE AVF +T/B Dialysis Access:  Dialysis Orders: Center: [facility]  on MWF . EDW 86kg HD Bath 2K/2Ca  Time 4 hrs Heparin 4000 units bolus. Access RUE AVF  BFR 350 DFR 500    Hectoral 5 mcg IV/HD,  Cinacalcet 30 mg with HD TIW  Assessment/Plan:  Subdural hematoma L>R - s/p left  parietal craniotomy and evacuation of hematoma.  Now with drain in place.  Did note right SDH but was small.  Neurosurgery managing.  ESRD -  did not finish complete treatment on Monday and now has Raliegh Ip so will plan for HD today without heparin and follow labs.  Hypertension/volume  - stable  Anemia  - Hgb 11, no micera  Metabolic bone disease -  continue with home meds  Nutrition - renal diet  Donetta Potts, MD [place] Pager [phone] 08/03/2020, 11:23 AM

## 2020-08-03 NOTE — Evaluation (Signed)
Physical Therapy Evaluation Patient Details Name: Karen [name] MRN: [id number] DOB: 11/07/1955 Today's Date: 08/03/2020   History of Present Illness  Pt is a 65 y.o. F who presents worsening headache; pt states she fell about 3 weeks ago when trying to clean cobwebs off her ceiling. CT revealed moderate left sided SDH with some midline shift, small left tentorial SDH and small right SDH. S/p left parietal craniotomy for evacuation of subdural hematoma. Significant PMH: breast CA, CHF, ESRD on dialysis (MWF).  Clinical Impression  PTA, pt is independent and the caregiver for her spouse; drives self to dialysis. Pt is very pleasant and motivated to participate in therapy evaluation. Reports mild headache and denies nausea/vomiting. Pt presents with functional weakness, balance deficits, and decreased activity tolerance in comparison to baseline. Ambulating x 30 feet with a walker at a min assist level; demonstrates slowed gait speed. Pt would benefit from intensive rehabilitation to progress to a modI level prior to discharge home. Suspect excellent progress given age, PLOF, and motivation.     Follow Up Recommendations CIR    Equipment Recommendations  Rolling walker with 5" wheels;3in1 (PT)    Recommendations for Other Services Rehab consult     Precautions / Restrictions Precautions Precautions: Fall;Other (comment) Precaution Comments: EVD; clamp before mobility Restrictions Weight Bearing Restrictions: No      Mobility  Bed Mobility Overal bed mobility: Needs Assistance Bed Mobility: Supine to Sit     Supine to sit: Min guard     General bed mobility comments: Use of bed rail, increased time/effort, exiting towards left side of bed. No physical assist required    Transfers Overall transfer level: Needs assistance Equipment used: Rolling walker (2 wheeled);None Transfers: Sit to/from Stand Sit to Stand: Min assist;+2 physical assistance;Mod assist          General transfer comment: Initially unable to power up to full standing with modA +2, progressing to minA with use of walker. Cues for knee extension. Able to weight shift R/L and march in place  Ambulation/Gait Ambulation/Gait assistance: Min assist Gait Distance (Feet): 30 Feet Assistive device: Rolling walker (2 wheeled) Gait Pattern/deviations: Step-through pattern;Decreased stride length Gait velocity: decreased   General Gait Details: Slow pace, downward gaze, cues for walker use, sequencing/direction. MinA for balance  Stairs            Wheelchair Mobility    Modified Rankin (Stroke Patients Only) Modified Rankin (Stroke Patients Only) Pre-Morbid Rankin Score: No symptoms Modified Rankin: Moderately severe disability     Balance Overall balance assessment: Needs assistance Sitting-balance support: Feet supported Sitting balance-Leahy Scale: Good     Standing balance support: Bilateral upper extremity supported Standing balance-Leahy Scale: Poor Standing balance comment: reliant on RW                             Pertinent Vitals/Pain Pain Assessment: 0-10 Pain Score: 2  Pain Location: incision site Pain Descriptors / Indicators: Discomfort Pain Intervention(s): Monitored during session;Repositioned    Home Living Family/patient expects to be discharged to:: Private residence Living Arrangements: Spouse/significant other Available Help at Discharge: Family;Available PRN/intermittently Type of Home: House Home Access: Stairs to enter Entrance Stairs-Rails: None Entrance Stairs-Number of Steps: 2 Home Layout: Two level;1/2 bath on main level (bedrooms are upstairs currently have made sunroom a bedroom for spouse, pt states i can sleep in my spouse lift chair) Home Equipment:  (lift chair for spouse, spouse does not walk so  uses wheelchair) Additional Comments: electrical lift to transfer spouse, son is helping with spouse at this time. no  caregivers other than patient for spouse    Prior Function Level of Independence: Independent         Comments: drives to HD MWF     Hand Dominance   Dominant Hand: Right    Extremity/Trunk Assessment   Upper Extremity Assessment Upper Extremity Assessment: Overall WFL for tasks assessed    Lower Extremity Assessment Lower Extremity Assessment: RLE deficits/detail;LLE deficits/detail RLE Deficits / Details: Strength 5/5 LLE Deficits / Details: Strength 5/5    Cervical / Trunk Assessment Cervical / Trunk Assessment: Normal  Communication   Communication: No difficulties  Cognition Arousal/Alertness: Awake/alert Behavior During Therapy: WFL for tasks assessed/performed Overall Cognitive Status: Within Functional Limits for tasks assessed                                        General Comments  VSS    Exercises     Assessment/Plan    PT Assessment Patient needs continued PT services  PT Problem List Decreased strength;Decreased activity tolerance;Decreased balance;Decreased mobility;Pain       PT Treatment Interventions DME instruction;Gait training;Stair training;Functional mobility training;Therapeutic activities;Therapeutic exercise;Balance training;Patient/family education    PT Goals (Current goals can be found in the Care Plan section)  Acute Rehab PT Goals Patient Stated Goal: return to independence PT Goal Formulation: With patient Time For Goal Achievement: 08/17/20 Potential to Achieve Goals: Good    Frequency Min 4X/week   Barriers to discharge        Co-evaluation PT/OT/SLP Co-Evaluation/Treatment: Yes Reason for Co-Treatment: For patient/therapist safety;To address functional/ADL transfers PT goals addressed during session: Mobility/safety with mobility         AM-PAC PT "6 Clicks" Mobility  Outcome Measure Help needed turning from your back to your side while in a flat bed without using bedrails?: None Help needed  moving from lying on your back to sitting on the side of a flat bed without using bedrails?: A Little Help needed moving to and from a bed to a chair (including a wheelchair)?: A Little Help needed standing up from a chair using your arms (e.g., wheelchair or bedside chair)?: A Lot Help needed to walk in hospital room?: A Little Help needed climbing 3-5 steps with a railing? : A Lot 6 Click Score: 17    End of Session Equipment Utilized During Treatment: Gait belt Activity Tolerance: Patient tolerated treatment well Patient left: in chair;with call bell/phone within reach;with chair alarm set Nurse Communication: Mobility status PT Visit Diagnosis: Unsteadiness on feet (R26.81);Difficulty in walking, not elsewhere classified (R26.2)    Time: 0086-7619 PT Time Calculation (min) (ACUTE ONLY): 34 min   Charges:   PT Evaluation $PT Eval Moderate Complexity: [place], PT, DPT Acute Rehabilitation Services Pager [phone] Office [phone]   Deno Etienne 08/03/2020, 9:45 AM

## 2020-08-03 NOTE — Progress Notes (Signed)
Subjective: Patient reports some mild headache but doing well, denies any NV   Objective: Vital signs in last 24 hours: Temp:  [97.2 F (36.2 C)-98.4 F (36.9 C)] 98 F (36.7 C) (07/19 0400) Pulse Rate:  [73-89] 75 (07/19 0600) Resp:  [11-29] 14 (07/19 0600) BP: (119-164)/(71-95) 130/83 (07/19 0600) SpO2:  [93 %-100 %] 94 % (07/19 0600) Weight:  [89 kg] 89 kg (07/18 2309)  Intake/Output from previous day: 07/18 0701 - 07/19 0700 In: 1249.1 [P.O.:240; I.V.:809.1; IV Piggyback:200] Out: 280 [Drains:180; Blood:100] Intake/Output this shift: No intake/output data recorded.  Neurologic: Grossly normal  Lab Results: Lab Results  Component Value Date   WBC 7.2 08/03/2020   HGB 11.0 (L) 08/03/2020   HCT 33.1 (L) 08/03/2020   MCV 99.1 08/03/2020   PLT 199 08/03/2020   Lab Results  Component Value Date   INR 1.1 08/02/2020   BMET Lab Results  Component Value Date   NA 136 08/03/2020   K 6.2 (H) 08/03/2020   CL 98 08/03/2020   CO2 24 08/03/2020   GLUCOSE 116 (H) 08/03/2020   BUN 45 (H) 08/03/2020   CREATININE 9.04 (H) 08/03/2020   CALCIUM 9.2 08/03/2020    Studies/Results: CT Head Wo Contrast  Result Date: 08/02/2020 CLINICAL DATA:  Headache after fall 1 month ago. EXAM: CT HEAD WITHOUT CONTRAST TECHNIQUE: Contiguous axial images were obtained from the base of the skull through the vertex without intravenous contrast. COMPARISON:  July 15, 2020. FINDINGS: Brain: Large left subdural hematoma is noted resulting in 4 mm of left-to-right midline shift. Maximum thickness is 11 mm. Small left tentorial subdural hematoma is noted as well. Ventricular size is within normal limits. Smaller right subdural hematoma is noted. Vascular: No hyperdense vessel or unexpected calcification. Skull: Normal. Negative for fracture or focal lesion. Sinuses/Orbits: No acute finding. Other: None. IMPRESSION: Large left subdural hematoma is noted resulting in 4 mm of left-to-right midline shift.  Small left tentorial subdural hematoma is also noted, as well as smaller right subdural hematoma. Critical Value/emergent results were called by telephone at the time of interpretation on 08/02/2020 at 12:09 pm to provider [facility] , who verbally acknowledged these results. Electronically Signed   By: Marijo Conception M.D.   On: 08/02/2020 12:09    Assessment/Plan: Postop day 1 crani for evacuation of SDH.  Ambulate out of bed today. Therapies Continue drain until tomorrow. Clamp when up ambulating.  Plan for dialysis today    LOS: 1 day    Karen [name] Karen [name] 08/03/2020, 8:00 AM

## 2020-08-03 NOTE — Progress Notes (Signed)
Occupational Therapy Evaluation Patient Details Name: Karen [name] MRN: [id number] DOB: 1955-10-06 Today's Date: 08/03/2020    History of Present Illness Pt is a 65 y.o. F who presents worsening headache; pt states she fell about 3 weeks ago when trying to clean cobwebs off her ceiling. CT revealed moderate left sided SDH with some midline shift, small left tentorial SDH and small right SDH. S/p left parietal craniotomy for evacuation of subdural hematoma. Significant PMH: breast CA, CHF, ESRD on dialysis (MWF).   Clinical Impression   Patient is s/p craniotomy L parietal surgery resulting in functional limitations due to the deficits listed below (see OT problem list). Pt currently with balance deficits affecting all adls. 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 Pt's son is currently full time care giving for father. Pt will progress quickly and benefit from maximized rehabilitation prior to home. CM please help (A) family with care giving needs for spouse to decrease stressor for patient.  Patient will benefit from skilled OT acutely to increase independence and safety with ADLS to allow discharge CIR.     Follow Up Recommendations  CIR    Equipment Recommendations  3 in 1 bedside commode;Other (comment) (RW)    Recommendations for Other Services Rehab consult     Precautions / Restrictions Precautions Precautions: Fall;Other (comment) Precaution Comments: EVD; clamp before mobility Restrictions Weight Bearing Restrictions: No      Mobility Bed Mobility Overal bed mobility: Needs Assistance Bed Mobility: Supine to Sit     Supine to sit: Min guard     General bed mobility comments: Use of bed rail, increased time/effort, exiting towards left side of bed. No physical assist required    Transfers Overall transfer level: Needs assistance Equipment used: Rolling walker (2 wheeled);None Transfers: Sit to/from  Stand Sit to Stand: Min assist;+2 physical assistance;Mod assist         General transfer comment: Initially unable to power up to full standing with modA +2, progressing to minA with use of walker. Cues for knee extension. Able to weight shift R/L and march in place    Balance Overall balance assessment: Needs assistance Sitting-balance support: Feet supported Sitting balance-Leahy Scale: Good     Standing balance support: Bilateral upper extremity supported Standing balance-Leahy Scale: Poor Standing balance comment: reliant on RW                           ADL either performed or assessed with clinical judgement   ADL Overall ADL's : Needs assistance/impaired Eating/Feeding: Set up;Bed level   Grooming: Wash/dry face;Set up;Sitting   Upper Body Bathing: Minimal assistance;Sitting   Lower Body Bathing: Minimal assistance;Sit to/from stand   Upper Body Dressing : Minimal assistance;Sitting       Toilet Transfer: +2 for physical assistance;Minimal assistance           Functional mobility during ADLs: +2 for physical assistance;Minimal assistance General ADL Comments: pt requesting to bath once up. pt oriented and aware of events. pt reports that she will not be cleaning anymore after this. pt reports near syncope s/p fall 3 weeks ago     Vision Baseline Vision/History: Wears glasses Wears Glasses: At all times Patient Visual Report:  (son has glasses) Vision Assessment?: No apparent visual deficits     Perception     Praxis      Pertinent Vitals/Pain Pain Assessment: 0-10 Pain Score: 2  Pain Location: incision site Pain Descriptors / Indicators: Discomfort Pain Intervention(s): Monitored during session;Repositioned     Hand Dominance Right   Extremity/Trunk Assessment Upper Extremity Assessment Upper Extremity Assessment: Overall WFL for tasks assessed   Lower Extremity Assessment Lower Extremity Assessment: RLE deficits/detail;LLE  deficits/detail RLE Deficits / Details: Strength 5/5 LLE Deficits / Details: Strength 5/5   Cervical / Trunk Assessment Cervical / Trunk Assessment: Normal   Communication Communication Communication: No difficulties   Cognition Arousal/Alertness: Awake/alert Behavior During Therapy: WFL for tasks assessed/performed Overall Cognitive Status: Within Functional Limits for tasks assessed                                     General Comments  drain clamped at start of session    Exercises     Shoulder Instructions      Home Living Family/patient expects to be discharged to:: Private residence Living Arrangements: Spouse/significant other Available Help at Discharge: Family;Available PRN/intermittently Type of Home: House Home Access: Stairs to enter Entrance Stairs-Number of Steps: 2 Entrance Stairs-Rails: None Home Layout: Two level;1/2 bath on main level (bedrooms are upstairs currently have made sunroom a bedroom for spouse, pt states i can sleep in my spouse lift chair) Alternate Level Stairs-Number of Steps: 14 Alternate Level Stairs-Rails: Left Bathroom Shower/Tub: Walk-in shower (second floor)   Bathroom Toilet: Standard     Home Equipment:  (lift chair for spouse, spouse does not walk so uses wheelchair)   Additional Comments: electrical lift to transfer spouse, son is helping with spouse at this time. no caregivers other than patient for spouse      Prior Functioning/Environment Level of Independence: Independent        Comments: drives to HD MWF        OT Problem List: Decreased strength;Decreased activity tolerance;Impaired balance (sitting and/or standing);Impaired vision/perception;Decreased knowledge of use of DME or AE;Decreased knowledge of precautions      OT Treatment/Interventions: Self-care/ADL training;Therapeutic exercise;Neuromuscular education;Energy conservation;DME and/or AE instruction;Manual therapy;Modalities;Therapeutic  activities;Visual/perceptual remediation/compensation;Patient/family education;Balance training    OT Goals(Current goals can be found in the care plan section) Acute Rehab OT Goals Patient Stated Goal: return to independence OT Goal Formulation: With patient Time For Goal Achievement: 08/17/20 Potential to Achieve Goals: Good  OT Frequency: Min 3X/week   Barriers to D/C: Decreased caregiver support  has a son that works that is off currently taking care of dependent spouse. pt reports that son must work so uncertain other family in area to (A)       Co-evaluation PT/OT/SLP Co-Evaluation/Treatment: Yes Reason for Co-Treatment: For patient/therapist safety PT goals addressed during session: Mobility/safety with mobility OT goals addressed during session: ADL's and self-care;Proper use of Adaptive equipment and DME;Strengthening/ROM      AM-PAC OT "6 Clicks" Daily Activity     Outcome Measure Help from another person eating meals?: A Little Help from another person taking care of personal grooming?: A Little Help from another person toileting, which includes using toliet, bedpan, or urinal?: A Little Help from another person bathing (including washing, rinsing, drying)?: A Little Help from another person to put on and taking off regular upper body clothing?: A Little Help from another person to put on and taking off regular lower body clothing?: A Little 6 Click Score: 18   End of Session Equipment Utilized During Treatment: Gait belt;Rolling walker (trach very easily pops loose with sitting due to pressure of  the tube) Nurse Communication: Mobility status  Activity Tolerance: Patient tolerated treatment well Patient left: in chair;with chair alarm set  OT Visit Diagnosis: Unsteadiness on feet (R26.81);Muscle weakness (generalized) (M62.81)                Time: 6788-9338 OT Time Calculation (min): 34 min Charges:  OT General Charges $OT Visit: 1 Visit OT Evaluation $OT Eval  Moderate Complexity: 1 Mod   Brynn, OTR/L  Acute Rehabilitation Services Pager: [phone] Office: [phone] .   Jeri Modena 08/03/2020, 10:37 AM

## 2020-08-03 NOTE — Anesthesia Postprocedure Evaluation (Signed)
Anesthesia Post Note  Patient: Ginnette Gates  Procedure(s) Performed: CRANIOTOMY HEMATOMA EVACUATION SUBDURAL (Left)     Patient location during evaluation: PACU Anesthesia Type: General Level of consciousness: awake and alert Pain management: pain level controlled Vital Signs Assessment: post-procedure vital signs reviewed and stable Respiratory status: spontaneous breathing, nonlabored ventilation, respiratory function stable and patient connected to nasal cannula oxygen Cardiovascular status: blood pressure returned to baseline and stable Postop Assessment: no apparent nausea or vomiting Anesthetic complications: no   No notable events documented.  Last Vitals:  Vitals:   08/03/20 1915 08/03/20 1930  BP: 121/70 128/76  Pulse: 78 79  Resp: 16 13  Temp:    SpO2: 98% 97%    Last Pain:  Vitals:   08/03/20 1845  TempSrc: Axillary  PainSc:    Pain Goal: Patients Stated Pain Goal: 4 (08/03/20 1200)                 Rilda Bulls

## 2020-08-04 ENCOUNTER — Other Ambulatory Visit: Payer: Self-pay | Admitting: Cardiovascular Disease

## 2020-08-04 LAB — BASIC METABOLIC PANEL
Anion gap: 14 (ref 5–15)
BUN: 35 mg/dL — ABNORMAL HIGH (ref 8–23)
CO2: 26 mmol/L (ref 22–32)
Calcium: 8.9 mg/dL (ref 8.9–10.3)
Chloride: 96 mmol/L — ABNORMAL LOW (ref 98–111)
Creatinine, Ser: 6.83 mg/dL — ABNORMAL HIGH (ref 0.44–1.00)
GFR, Estimated: 6 mL/min — ABNORMAL LOW (ref >60)
Glucose, Bld: 147 mg/dL — ABNORMAL HIGH (ref 70–99)
Potassium: 5.4 mmol/L — ABNORMAL HIGH (ref 3.5–5.1)
Sodium: 136 mmol/L (ref 135–145)

## 2020-08-04 MED ORDER — DEXAMETHASONE 2 MG PO TABS
1.0000 mg | ORAL_TABLET | Freq: Four times a day (QID) | ORAL | Status: DC
  Administered 2020-08-04 – 2020-08-06 (×8): 1 mg via ORAL
  Filled 2020-08-04 (×4): qty 1
  Filled 2020-08-04: qty 2
  Filled 2020-08-04: qty 1
  Filled 2020-08-04: qty 2
  Filled 2020-08-04 (×2): qty 1

## 2020-08-04 MED ORDER — SODIUM ZIRCONIUM CYCLOSILICATE 10 G PO PACK
10.0000 g | PACK | Freq: Two times a day (BID) | ORAL | Status: DC
  Administered 2020-08-04 – 2020-08-07 (×7): 10 g via ORAL
  Filled 2020-08-04 (×8): qty 1

## 2020-08-04 NOTE — Progress Notes (Signed)
Occupational Therapy Treatment Patient Details Name: Karen [name] MRN: [id number] DOB: 07/11/1955 Today's Date: 08/04/2020    History of present illness Pt is a 65 y.o. F who presents worsening headache; pt states she fell about 3 weeks ago when trying to clean cobwebs off her ceiling. CT revealed moderate left sided SDH with some midline shift, small left tentorial SDH and small right SDH. S/p left parietal craniotomy for evacuation of subdural hematoma. 7/20 EVD removedSignificant PMH: breast CA, CHF, ESRD on dialysis (MWF).   OT comments  Pt progressing toward goals and motivated to bath this session. Pt was able to complete sink level bath min guard (A) this session with UE support for balance required. Pt continues to progress and could benefit from continued dynamic balance task next session. 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    Follow Up Recommendations  CIR    Equipment Recommendations  3 in 1 bedside commode;Other (comment)    Recommendations for Other Services Rehab consult    Precautions / Restrictions Precautions Precautions: Fall Restrictions Weight Bearing Restrictions: No       Mobility Bed Mobility               General bed mobility comments: oob in chair on arrival    Transfers                      Balance Overall balance assessment: Needs assistance         Standing balance support: Single extremity supported;During functional activity Standing balance-Leahy Scale: Good                             ADL either performed or assessed with clinical judgement   ADL Overall ADL's : Needs assistance/impaired     Grooming: Oral care;Min guard;Standing   Upper Body Bathing: Min guard;Standing   Lower Body Bathing: Min guard;Sit to/from stand   Upper Body Dressing : Min guard;Standing       Toilet Transfer: Min guard (requires bil UE)            Functional mobility during ADLs: Min guard General ADL Comments: pt completed sink level bathing this session with UE support for balance. pt able to alternate attention and sequence task     Vision       Perception     Praxis      Cognition Arousal/Alertness: Awake/alert Behavior During Therapy: WFL for tasks assessed/performed Overall Cognitive Status: Within Functional Limits for tasks assessed                                          Exercises Other Exercises Other Exercises: denies light sensitivity or sound   Shoulder Instructions       General Comments VSS    Pertinent Vitals/ Pain       Pain Assessment: 0-10 Pain Score: 2  Pain Location: incision site Pain Descriptors / Indicators: Headache Pain Intervention(s): Monitored during session;Repositioned  Home Living                                          Prior Functioning/Environment  Frequency  Min 3X/week        Progress Toward Goals  OT Goals(current goals can now be found in the care plan section)  Progress towards OT goals: Progressing toward goals  Acute Rehab OT Goals Patient Stated Goal: [place] and do something for myself OT Goal Formulation: With patient Time For Goal Achievement: 08/17/20 Potential to Achieve Goals: Good ADL Goals Pt Will Perform Upper Body Bathing: with supervision;sitting Pt Will Perform Lower Body Bathing: with supervision;sit to/from stand Pt Will Transfer to Toilet: with min guard assist;ambulating;bedside commode Additional ADL Goal #1: pt will complete bed mobility min guard (A) as precursor to adls.  Plan Discharge plan remains appropriate    Co-evaluation                 AM-PAC OT "6 Clicks" Daily Activity     Outcome Measure   Help from another person eating meals?: A Little Help from another person taking care of personal grooming?: A Little Help from another person toileting,  which includes using toliet, bedpan, or urinal?: A Little Help from another person bathing (including washing, rinsing, drying)?: A Little Help from another person to put on and taking off regular upper body clothing?: A Little Help from another person to put on and taking off regular lower body clothing?: A Little 6 Click Score: 18    End of Session    OT Visit Diagnosis: Unsteadiness on feet (R26.81);Muscle weakness (generalized) (M62.81)   Activity Tolerance Patient tolerated treatment well   Patient Left in chair;with call bell/phone within reach;with chair alarm set   Nurse Communication Mobility status;Precautions        Time: 3704-8889 OT Time Calculation (min): 21 min  Charges: OT General Charges $OT Visit: 1 Visit OT Treatments $Self Care/Home Management : 8-22 mins   Brynn, OTR/L  Acute Rehabilitation Services Pager: [phone] Office: [phone] .    Jeri Modena 08/04/2020, 9:42 AM

## 2020-08-04 NOTE — PMR Pre-admission (Signed)
PMR Admission Coordinator Pre-Admission Assessment  Patient: Karen [name] is an 65 y.o., female MRN: [id number] DOB: [birth year] Height: $RemoveBeforeDE'5\' 1"'xxdqCoJooxzwCMG$  (154.9 cm) Weight: 91.3 kg  Insurance Information HMO:     PPO: yes     PCP:      IPA:      80/20:      OTHER:  PRIMARY: [place] [employer]      Policy#: [id number]      Subscriber: pt CM Name:       Phone#: [phone] Fax#: [phone] Pre-Cert#: [id number]   Received approval from [employer] on 08/06/20. Pt approved for 7 days beginning 08/06/20 with updates due 08/12/20   Employer:  Benefits:  Phone #: [phone]     Name: 7/20 Eff. Date: 01/17/2020     Deduct: $200      Out of Pocket Max: $2200      Life Max: none CIR: After deductible 100% covered      SNF: after deductible, 100% covered Outpatient: 100%     Co-Pay: visits per medical neccesity Home Health: 100%      Co-Pay: visits per medical neccesity DME: 100%     Co-Pay: none Providers: in network  SECONDARY: none  financial Counselor:       Phone#:   [employer] Engineer, [employer]" for patients in Inpatient Rehabilitation Facilities with attached "Privacy Act [place] Records" was provided and verbally reviewed with: Patient  Emergency Contact Information Contact Information     Name Relation Home Work Mobile   Ramey Son   Taylor Daughter   [phone]   Mays,Latoya Daughter   [phone]       Current Medical History  Patient Admitting Diagnosis: SDH  History of Present Illness: 65 year old right-handed female with history of diastolic congestive heart failure, colitis, atrial fibrillation, end-stage renal disease on hemodialysis Monday Wednesday Friday [facility] facility, hypertension, right breast cancer 2019.    Presented 08/02/2020 with a fall 3 weeks ago and noted progressive headaches.  She did see her PCP CT of the head 07/15/2020 negative.  Cranial CT scan 08/02/2020  showed a large left subdural hematoma noted resulting in a 4 mm left-to-right midline shift.  Small left tentorial subdural hematoma also noted as well as a smaller right subdural hematoma.  Underwent left parietal craniotomy for evacuation of subdural hematoma 08/02/2020 per Dr. Sherley Bounds.  Maintained on Keppra for seizure prophylaxis.  Decadron protocol as directed.  Hemodialysis ongoing as per renal services.  Monitoring of blood pressure with history of hypotension maintained on ProAmatine.  Tolerating a regular consistency diet.  Patient's medical record from [facility]  has been reviewed by the rehabilitation admission coordinator and physician.  Past Medical History  Past Medical History:  Diagnosis Date   Anemia    Arthritis    knees   Breast cancer ([place]) 2019   Right Breast Cancer   Cancer [facility])    Chest pain Jan 2016   low risk Myoview    CHF (congestive heart failure) ([place])    Colitis 12/08/2014   [facility]- focal moderate active colitis   Complication of anesthesia 2005   difficulty remembering for a while and waking up   Constipation    Dysrhythmia    h/o A-Fib   Elevated LFTs 2016   [facility]   ESRD on dialysis [facility]) April 2016   MWF [place]   Fundic gland polyps of stomach, benign    GERD (gastroesophageal reflux  disease)    Heart murmur Nov 2015   Aortic scleosis- no stenosis   Hemodialysis patient [facility])    Hypertension    Shortness of breath dyspnea    with exertion    Family History   family history includes Diabetes in her sister; Heart attack in her father; Hyperlipidemia in her sister; Hypertension in her sister; Kidney disease in her father, mother, and sister.  Prior Rehab/Hospitalizations Has the patient had prior rehab or hospitalizations prior to admission? Yes  Has the patient had major surgery during 100 days prior to admission? Yes   Current Medications  Current Facility-Administered Medications:    acetaminophen  (TYLENOL) tablet 650 mg, 650 mg, Oral, Q4H PRN, 650 mg at 08/06/20 1959 **OR** acetaminophen (TYLENOL) suppository 650 mg, 650 mg, Rectal, Q4H PRN, Eustace Moore, MD   Chlorhexidine Gluconate Cloth 2 % PADS 6 each, 6 each, Topical, Daily, Eustace Moore, MD, 6 each at 08/07/20 [phone]   Chlorhexidine Gluconate Cloth 2 % PADS 6 each, 6 each, Topical, Q0600, Loren Racer, PA-C, 6 each at 08/07/20 9476   diltiazem (CARDIZEM) tablet 30 mg, 30 mg, Oral, Q M,W,F, Eustace Moore, MD, 30 mg at 08/06/20 5465   HYDROcodone-acetaminophen (NORCO/VICODIN) 5-325 MG per tablet 1 tablet, 1 tablet, Oral, Q6H PRN, Eustace Moore, MD, 1 tablet at 08/07/20 0439   labetalol (NORMODYNE) injection 10-40 mg, 10-40 mg, Intravenous, Q10 min PRN, Eustace Moore, MD   levETIRAcetam (KEPPRA) tablet 250 mg, 250 mg, Oral, BID, Eustace Moore, MD, 250 mg at 08/07/20 0815   lidocaine-prilocaine (EMLA) cream 1 application, 1 application, Topical, See admin instructions, Eustace Moore, MD   midodrine (PROAMATINE) tablet 20 mg, 20 mg, Oral, Q M,W,F, Eustace Moore, MD   morphine 2 MG/ML injection 1-2 mg, 1-2 mg, Intravenous, Q2H PRN, Eustace Moore, MD   multivitamin (RENA-VIT) tablet 1 tablet, 1 tablet, Oral, Daily, Eustace Moore, MD, 1 tablet at 08/07/20 0814   ondansetron [facility]) tablet 4 mg, 4 mg, Oral, Q4H PRN, 4 mg at 08/06/20 0745 **OR** ondansetron (ZOFRAN) injection 4 mg, 4 mg, Intravenous, Q4H PRN, Eustace Moore, MD, 4 mg at 08/06/20 1619   ondansetron [facility]) tablet 8 mg, 8 mg, Oral, Q8H PRN, Eustace Moore, MD, 8 mg at 08/05/20 1227   pantoprazole (PROTONIX) EC tablet 40 mg, 40 mg, Oral, Daily, Eustace Moore, MD, 40 mg at 08/07/20 0815   promethazine (PHENERGAN) tablet 12.5-25 mg, 12.5-25 mg, Oral, Q4H PRN, Eustace Moore, MD   senna [facility]) tablet 8.6 mg, 1 tablet, Oral, BID, Eustace Moore, MD, 8.6 mg at 08/07/20 0815   sevelamer carbonate (RENVELA) tablet 2,400 mg, 2,400 mg, Oral, TID WC, 2,400 mg at 08/07/20  0814 **AND** sevelamer carbonate (RENVELA) tablet 1,600 mg, 1,600 mg, Oral, PRN, Eustace Moore, MD   sodium zirconium cyclosilicate (LOKELMA) packet 10 g, 10 g, Oral, BID, Donato Heinz, MD, 10 g at 08/07/20 0815  Patients Current Diet:  Diet Order             Diet renal with fluid restriction Fluid restriction: 1200 mL Fluid; Room service appropriate? Yes; Fluid consistency: Thin  Diet effective now                  Precautions / Restrictions Precautions Precautions: Fall Precaution Comments: EVD; clamp before mobility Restrictions Weight Bearing Restrictions: No   Has the patient had 2 or more falls or a fall with injury in the  past year? Yes  Prior Activity Level Community (5-7x/wk): Independent, driving, caregiver for her bedridden spouse  Prior Functional Level Self Care: Did the patient need help bathing, dressing, using the toilet or eating? Independent  Indoor Mobility: Did the patient need assistance with walking from room to room (with or without device)? Independent  Stairs: Did the patient need assistance with internal or external stairs (with or without device)? Independent  Functional Cognition: Did the patient need help planning regular tasks such as shopping or remembering to take medications? Independent  Home Assistive Devices / Equipment Home Equipment:  (lift chair for spouse, spouse does not walk so uses wheelchair)  Prior Device Use: Indicate devices/aids used by the patient prior to current illness, exacerbation or injury? None of the above  Current Functional Level Cognition  Overall Cognitive Status: Within Functional Limits for tasks assessed Orientation Level: Oriented X4 General Comments: Pt participating naming task going from animals that start with A, B, C...etc while performing functional mobility in the hallway. Pt reporting she is trying to rest, but has difficulty trying not to think about what is going on at home.    Extremity  Assessment (includes Sensation/Coordination)  Upper Extremity Assessment: Overall WFL for tasks assessed  Lower Extremity Assessment: Defer to PT evaluation RLE Deficits / Details: Strength 5/5 LLE Deficits / Details: Strength 5/5    ADLs  Overall ADL's : Needs assistance/impaired Eating/Feeding: Set up, Bed level Grooming: Oral care, Min guard, Standing Upper Body Bathing: Min guard, Standing Lower Body Bathing: Min guard, Sit to/from stand Upper Body Dressing : Min guard, Standing Toilet Transfer: Min Psychiatric nurse Details (indicate cue type and reason): Simulated toilet transfer from EOB Functional mobility during ADLs: Min guard General ADL Comments: Focus session on higher level balance activities in preparation for return to IADLs and being a primary caregiver to her husband. Performing functional mobility in hall with min Guard A.    Mobility  Overal bed mobility: Modified Independent Bed Mobility: Supine to Sit Supine to sit: Supervision Sit to supine: Supervision General bed mobility comments: Mod I for increased time.    Transfers  Overall transfer level: Needs assistance Equipment used: None Transfers: Sit to/from Stand Sit to Stand: Supervision General transfer comment: supervision for safety, no physical assistance required.    Ambulation / Gait / Stairs / Wheelchair Mobility  Ambulation/Gait Ambulation/Gait assistance: Min guard, Herbalist (Feet): 250 Feet Assistive device: None Gait Pattern/deviations: Step-through pattern, Decreased stride length, Narrow base of support General Gait Details: slow guarded gait throughout with limited arm swing and head motion with open environment. MinA with introduction of dynamic activities (i.e head turns, obstacle negotiation). Mild L inattention noted with tending to glide closely by obstacles on L without awareness Gait velocity: decreased Stairs: Yes Stairs assistance: Min guard Stair Management:  One rail Right, Step to pattern, Forwards Number of Stairs: 4 General stair comments: min guard for safety with use of R hand rail    Posture / Balance Balance Overall balance assessment: Needs assistance Sitting-balance support: Feet supported Sitting balance-Leahy Scale: Good Standing balance support: No upper extremity supported, During functional activity Standing balance-Leahy Scale: Good Standing balance comment: Min Guard A with dynamic reaching task    Special needs/care consideration HD MWF Drove self to hemodialysis She is caregiver for her spouse who is bedridden. He had CVA 3 years ago and in March she removed him from SNF to be his caregiver   Previous Home Environment  Living Arrangements:  Spouse/significant other  Lives With: Spouse Available Help at Discharge: Family, Available PRN/intermittently Type of Home: House Home Layout: Two level, 1/2 bath on main level (bedrooms are upstairs currently have made sunroom a bedroom for spouse, pt states i can sleep in my spouse lift chair) Alternate Level Stairs-Rails: Left Alternate Level Stairs-Number of Steps: 14 Home Access: Stairs to enter Entrance Stairs-Rails: None Entrance Stairs-Number of Steps: 2 Bathroom Shower/Tub: Multimedia programmer: Standard Bathroom Accessibility: Yes How Accessible: Accessible via walker Home Care Services: No Additional Comments: electrical lift to transfer spouse, son is helping with spouse at this time. no caregivers other than patient for spouse  Discharge Living Setting Plans for Discharge Living Setting: Patient's home, Lives with (comment) (spouse) Type of Home at Discharge: House Discharge Home Layout: Two level, 1/2 bath on main level, Bed/bath upstairs Alternate Level Stairs-Rails: Left Alternate Level Stairs-Number of Steps: 14 Discharge Home Access: Stairs to enter Entrance Stairs-Rails: None Entrance Stairs-Number of Steps: 2 Discharge Bathroom Shower/Tub:  Horticulturist, commercial: Standard Discharge Bathroom Accessibility: Yes How Accessible: Accessible via walker Does the patient have any problems obtaining your medications?: No  Social/Family/Support Systems Patient Roles: Spouse, Caregiver (she is caregiver for her bedridden spouse ( h/o CVA)) Contact Information: son Eustace Moore Anticipated Caregiver: son intermittent asisst only Anticipated Ambulance person Information: see above Ability/Limitations of Caregiver: son is a Stephanie Coup, daughter recently moved to [place] Caregiver Availability: Intermittent Discharge Plan Discussed with Primary Caregiver: Yes Is Caregiver In Agreement with Plan?: Yes Does Caregiver/Family have Issues with Lodging/Transportation while Pt is in Rehab?: No  Very limited caregiver supports for herself and her spouse  Goals Patient/Family Goal for Rehab: Mod I with PT and OT Expected length of stay: ELOS 7 to 10 days Additional Information: Hemodialysis Pt/Family Agrees to Admission and willing to participate: Yes Program Orientation Provided & Reviewed with Pt/Caregiver Including Roles  & Responsibilities: Yes  Decrease burden of Care through IP rehab admission: n/a  Possible need for SNF placement upon discharge: not anticipated  Patient Condition: I have reviewed medical records from [facility], spoken with CM, and patient. I met with patient at the bedside for inpatient rehabilitation assessment.  Patient will benefit from ongoing PT and OT, can actively participate in 3 hours of therapy a day 5 days of the week, and can make measurable gains during the admission.  Patient will also benefit from the coordinated team approach during an Inpatient Acute Rehabilitation admission.  The patient will receive intensive therapy as well as Rehabilitation physician, nursing, social worker, and care management interventions.  Due to bladder management, bowel management, safety, skin/wound care,  disease management, medication administration, pain management, and patient education the patient requires 24 hour a day rehabilitation nursing.  The patient is currently Mod I-Min A with mobility and Min G with basic ADLs.  Discharge setting and therapy post discharge at home with home health is anticipated.  Patient has agreed to participate in the Acute Inpatient Rehabilitation Program and will admit today.  Preadmission Screen Completed By: Danne Baxter RN MSN with updates by Gayland Curry, 08/07/2020 9:46 AM ______________________________________________________________________   Discussed status with Dr. Letta Pate on 08/07/20  at 9:46 AM and received approval for admission today.  Admission Coordinator: Danne Baxter RN MSN with updates by Bethel Born, CCC-SLP, time 9:46 AM/Date 08/07/20    Assessment/Plan: Diagnosis:decline in mobiltiy and ADL due to traumatic Left SDH s/p evacuation Does the need for close, 24 hr/day Medical supervision in  concert with the patient's rehab needs make it unreasonable for this patient to be served in a less intensive setting? Yes Co-Morbidities requiring supervision/potential complications: A fib, ESRD, d CHF Due to bladder management, bowel management, safety, skin/wound care, disease management, medication administration, pain management, and patient education, does the patient require 24 hr/day rehab nursing? Yes Does the patient require coordinated care of a physician, rehab nurse, PT, OT, and SLP to address physical and functional deficits in the context of the above medical diagnosis(es)? Yes Addressing deficits in the following areas: balance, endurance, locomotion, strength, transferring, bowel/bladder control, bathing, dressing, feeding, and psychosocial support Can the patient actively participate in an intensive therapy program of at least 3 hrs of therapy 5 days a week? Yes The potential for patient to make measurable gains  while on inpatient rehab is good Anticipated functional outcomes upon discharge from inpatient rehab: modified independent PT, modified independent and supervision OT, modified independent and supervision SLP Estimated rehab length of stay to reach the above functional goals is: 7-10d Anticipated discharge destination: Home 10. Overall Rehab/Functional Prognosis: good   MD Signature: Charlett Blake M.D. [facility] Fellow Am Acad of Phys Med and Rehab Diplomate Am Board of Electrodiagnostic Med Fellow Am Board of Interventional Pain

## 2020-08-04 NOTE — Progress Notes (Signed)
Inpatient Rehabilitation Admissions Coordinator   I met with patient at bedside She is in agreement to a possible Cir admit prior to return home. I will begin Auth with [place] for possible admit this week.  Danne Baxter, RN, MSN Rehab Admissions Coordinator [phone] 08/04/2020 12:04 PM

## 2020-08-04 NOTE — Progress Notes (Addendum)
Subjective: Patient reports some mild headache but feeling pretty good. No acute events overnight   Objective: Vital signs in last 24 hours: Temp:  [97.8 F (36.6 C)-98.1 F (36.7 C)] 97.8 F (36.6 C) (07/20 0400) Pulse Rate:  [69-102] 79 (07/20 0700) Resp:  [10-21] 14 (07/20 0700) BP: (98-139)/(55-83) 116/73 (07/20 0700) SpO2:  [92 %-99 %] 95 % (07/20 0700)  Intake/Output from previous day: 07/19 0701 - 07/20 0700 In: -  Out: 1169 [Drains:169] Intake/Output this shift: Total I/O In: -  Out: 7 [Drains:7]  Neurologic: Grossly normal  Lab Results: Lab Results  Component Value Date   WBC 7.2 08/03/2020   HGB 11.0 (L) 08/03/2020   HCT 33.1 (L) 08/03/2020   MCV 99.1 08/03/2020   PLT 199 08/03/2020   Lab Results  Component Value Date   INR 1.1 08/02/2020   BMET Lab Results  Component Value Date   NA 136 08/03/2020   K 6.2 (H) 08/03/2020   CL 98 08/03/2020   CO2 24 08/03/2020   GLUCOSE 116 (H) 08/03/2020   BUN 45 (H) 08/03/2020   CREATININE 9.04 (H) 08/03/2020   CALCIUM 9.2 08/03/2020    Studies/Results: CT HEAD WO CONTRAST  Result Date: 08/03/2020 CLINICAL DATA:  Subdural hemorrhage follow-up. EXAM: CT HEAD WITHOUT CONTRAST TECHNIQUE: Contiguous axial images were obtained from the base of the skull through the vertex without intravenous contrast. COMPARISON:  08/02/2020. FINDINGS: Brain: Status post left frontal craniotomy for subdural hematoma evacuation and placement of a drain. The drain tip is within the posterior aspect of the left convexity subdural hematoma. Suspected Gel-Foam subjacent to the craniotomy defect. The left convexity hematoma is overall decreased in volume with similar maximal thickness of 11 mm. Some areas of new internal hyperdensity may represent interval hemorrhage. Similar hemorrhage layering along the left tentorial leaflet. Increased pneumocephalus subjacent to the craniotomy and along the anti dependent left frontal convexity. Midline shift  is improved, now 2 mm at the foramen of Monro. No hydrocephalus. No evidence of acute large vascular territory infarct. Similar smaller (up to 6 mm thickness) lower density right subdural collection. No mass lesion. Partially empty sella. Vascular: Calcific atherosclerosis. No hyperdense vessel identified. Skull: Left frontal craniotomy. Sinuses/Orbits: Clear sinuses.  Unremarkable orbits. Other: No mastoid effusions. IMPRESSION: 1. Status post left frontal craniotomy for subdural hematoma evacuation and placement of a drain. The left convexity hematoma is overall decreased in volume with similar maximal thickness of 11 mm. Some areas of new internal hyperdensity may represent interval hemorrhage and warrant continued attention on follow-up. 2. Improved mass effect with 2 mm of rightward midline shift at the foramen of Monro, previously 4 mm. 3. Similar hemorrhage layering along the left tentorial leaflet and similar smaller (up to 6 mm thickness) right subdural hematoma. Electronically Signed   By: Margaretha Sheffield MD   On: 08/03/2020 08:11   CT Head Wo Contrast  Result Date: 08/02/2020 CLINICAL DATA:  Headache after fall 1 month ago. EXAM: CT HEAD WITHOUT CONTRAST TECHNIQUE: Contiguous axial images were obtained from the base of the skull through the vertex without intravenous contrast. COMPARISON:  July 15, 2020. FINDINGS: Brain: Large left subdural hematoma is noted resulting in 4 mm of left-to-right midline shift. Maximum thickness is 11 mm. Small left tentorial subdural hematoma is noted as well. Ventricular size is within normal limits. Smaller right subdural hematoma is noted. Vascular: No hyperdense vessel or unexpected calcification. Skull: Normal. Negative for fracture or focal lesion. Sinuses/Orbits: No acute finding. Other: None.  IMPRESSION: Large left subdural hematoma is noted resulting in 4 mm of left-to-right midline shift. Small left tentorial subdural hematoma is also noted, as well as  smaller right subdural hematoma. Critical Value/emergent results were called by telephone at the time of interpretation on 08/02/2020 at 12:09 pm to provider [facility] , who verbally acknowledged these results. Electronically Signed   By: Marijo Conception M.D.   On: 08/02/2020 12:09    Assessment/Plan: 65 year old female postop day 2 crani for evac of SDH.  Drain d/c today OOB with therapies Patient takes care of her bed bound husband at home. Really has no one to care for her Transfer to 4NP today, Q4 hour neuro checks Dialysis per nephrology    LOS: 2 days    Ocie Cornfield [facility] 08/04/2020, 8:24 AM

## 2020-08-04 NOTE — Progress Notes (Signed)
Patient ID: Karen [name], female   DOB: 09-02-1955, 65 y.o.   MRN: [id number] S:Feels better today.  Drain removed and staples placed O:BP 118/73   Pulse 78   Temp 97.8 F (36.6 C) (Oral)   Resp 19   Ht 5\' 1"  (1.549 m)   Wt 89 kg   LMP  (LMP Unknown)   SpO2 97%   BMI 37.07 kg/m   Intake/Output Summary (Last 24 hours) at 08/04/2020 1036 Last data filed at 08/04/2020 0800 Gross per 24 hour  Intake --  Output 1176 ml  Net -1176 ml   Intake/Output: I/O last 3 completed shifts: In: 749.1 [P.O.:240; I.V.:309.1; IV Piggyback:200] Out: 2536 [Drains:329; Other:1000]  Intake/Output this shift:  Total I/O In: -  Out: 7 [Drains:7] Weight change:  Gen:NAD CVS:RRR Resp:CTA Abd: +Bs, soft, NT/ND Ext: no edema, RUE AVF +T/B  Recent Labs  Lab 08/02/20 0942 08/03/20 0245  NA 136 136  K 4.7 6.2*  CL 97* 98  CO2 28 24  GLUCOSE 101* 116*  BUN 37* 45*  CREATININE 7.58* 9.04*  CALCIUM 8.9 9.2   Liver Function Tests: No results for input(s): AST, ALT, ALKPHOS, BILITOT, PROT, ALBUMIN in the last 168 hours. No results for input(s): LIPASE, AMYLASE in the last 168 hours. No results for input(s): AMMONIA in the last 168 hours. CBC: Recent Labs  Lab 08/02/20 0942 08/03/20 0245  WBC 3.9* 7.2  HGB 10.4* 11.0*  HCT 31.9* 33.1*  MCV 100.3* 99.1  PLT 196 199   Cardiac Enzymes: No results for input(s): CKTOTAL, CKMB, CKMBINDEX, TROPONINI in the last 168 hours. CBG: No results for input(s): GLUCAP in the last 168 hours.  Iron Studies: No results for input(s): IRON, TIBC, TRANSFERRIN, FERRITIN in the last 72 hours. Studies/Results: CT HEAD WO CONTRAST  Result Date: 08/03/2020 CLINICAL DATA:  Subdural hemorrhage follow-up. EXAM: CT HEAD WITHOUT CONTRAST TECHNIQUE: Contiguous axial images were obtained from the base of the skull through the vertex without intravenous contrast. COMPARISON:  08/02/2020. FINDINGS: Brain: Status post left frontal craniotomy for subdural hematoma  evacuation and placement of a drain. The drain tip is within the posterior aspect of the left convexity subdural hematoma. Suspected Gel-Foam subjacent to the craniotomy defect. The left convexity hematoma is overall decreased in volume with similar maximal thickness of 11 mm. Some areas of new internal hyperdensity may represent interval hemorrhage. Similar hemorrhage layering along the left tentorial leaflet. Increased pneumocephalus subjacent to the craniotomy and along the anti dependent left frontal convexity. Midline shift is improved, now 2 mm at the foramen of Monro. No hydrocephalus. No evidence of acute large vascular territory infarct. Similar smaller (up to 6 mm thickness) lower density right subdural collection. No mass lesion. Partially empty sella. Vascular: Calcific atherosclerosis. No hyperdense vessel identified. Skull: Left frontal craniotomy. Sinuses/Orbits: Clear sinuses.  Unremarkable orbits. Other: No mastoid effusions. IMPRESSION: 1. Status post left frontal craniotomy for subdural hematoma evacuation and placement of a drain. The left convexity hematoma is overall decreased in volume with similar maximal thickness of 11 mm. Some areas of new internal hyperdensity may represent interval hemorrhage and warrant continued attention on follow-up. 2. Improved mass effect with 2 mm of rightward midline shift at the foramen of Monro, previously 4 mm. 3. Similar hemorrhage layering along the left tentorial leaflet and similar smaller (up to 6 mm thickness) right subdural hematoma. Electronically Signed   By: Margaretha Sheffield MD   On: 08/03/2020 08:11   CT Head Wo Contrast  Result  Date: 08/02/2020 CLINICAL DATA:  Headache after fall 1 month ago. EXAM: CT HEAD WITHOUT CONTRAST TECHNIQUE: Contiguous axial images were obtained from the base of the skull through the vertex without intravenous contrast. COMPARISON:  July 15, 2020. FINDINGS: Brain: Large left subdural hematoma is noted resulting in 4  mm of left-to-right midline shift. Maximum thickness is 11 mm. Small left tentorial subdural hematoma is noted as well. Ventricular size is within normal limits. Smaller right subdural hematoma is noted. Vascular: No hyperdense vessel or unexpected calcification. Skull: Normal. Negative for fracture or focal lesion. Sinuses/Orbits: No acute finding. Other: None. IMPRESSION: Large left subdural hematoma is noted resulting in 4 mm of left-to-right midline shift. Small left tentorial subdural hematoma is also noted, as well as smaller right subdural hematoma. Critical Value/emergent results were called by telephone at the time of interpretation on 08/02/2020 at 12:09 pm to provider [facility] , who verbally acknowledged these results. Electronically Signed   By: Marijo Conception M.D.   On: 08/02/2020 12:09    Chlorhexidine Gluconate Cloth  6 each Topical Daily   Chlorhexidine Gluconate Cloth  6 each Topical Q0600   dexamethasone  1 mg Oral Q6H   diltiazem  30 mg Oral Q M,W,F   levETIRAcetam  250 mg Oral BID   lidocaine-prilocaine  1 application Topical See admin instructions   midodrine  20 mg Oral Q M,W,F   multivitamin  1 tablet Oral Daily   pantoprazole  40 mg Oral Daily   senna  1 tablet Oral BID   sevelamer carbonate  2,400 mg Oral TID WC    BMET    Component Value Date/Time   NA 136 08/03/2020 0245   K 6.2 (H) 08/03/2020 0245   CL 98 08/03/2020 0245   CO2 24 08/03/2020 0245   GLUCOSE 116 (H) 08/03/2020 0245   BUN 45 (H) 08/03/2020 0245   CREATININE 9.04 (H) 08/03/2020 0245   CREATININE 7.25 (HH) 08/06/2018 0751   CREATININE 5.71 (H) 02/27/2014 0900   CALCIUM 9.2 08/03/2020 0245   CALCIUM 9.0 01/28/2014 1454   GFRNONAA 4 (L) 08/03/2020 0245   GFRNONAA 5 (L) 08/06/2018 0751   GFRAA 5 (L) 04/15/2019 1709   GFRAA 6 (L) 08/06/2018 0751   CBC    Component Value Date/Time   WBC 7.2 08/03/2020 0245   RBC 3.34 (L) 08/03/2020 0245   HGB 11.0 (L) 08/03/2020 0245   HGB 9.6 (L)  08/06/2018 0751   HCT 33.1 (L) 08/03/2020 0245   PLT 199 08/03/2020 0245   PLT 176 08/06/2018 0751   MCV 99.1 08/03/2020 0245   MCH 32.9 08/03/2020 0245   MCHC 33.2 08/03/2020 0245   RDW 13.3 08/03/2020 0245   LYMPHSABS 0.7 08/06/2018 0751   MONOABS 0.5 08/06/2018 0751   EOSABS 0.3 08/06/2018 0751   BASOSABS 0.1 08/06/2018 0751    Dialysis Orders: Center: [facility]  on MWF . EDW 86kg HD Bath 2K/2Ca  Time 4 hrs Heparin 4000 units bolus. Access RUE AVF  BFR 350 DFR 500    Hectoral 5 mcg IV/HD,  Cinacalcet 30 mg with HD TIW   Assessment/Plan:  Subdural hematoma L>R - s/p left parietal craniotomy and evacuation of hematoma.  Now with drain in place.  Did note right SDH but was small.  Neurosurgery managing.  ESRD -  did not finish her treatment on Monday but had HD late last night.  K this morning 6.2 and unclear if this was pre or post dialysis.  Will  check stat bmet today and if K still high will plan on HD today and continue with MWF schedule.   Hypertension/volume  - stable  Anemia  - Hgb 11, no micera  Metabolic bone disease -  continue with home meds  Nutrition - renal diet  Donetta Potts, MD [employer] [phone]

## 2020-08-04 NOTE — Progress Notes (Signed)
Pt has been admitted to the unit all belongings haas been sent with the pt. Call light and telephone in reach.  08/04/20 1531  Vitals  Temp 97.7 F (36.5 C)  Temp Source Oral  BP 135/63  MAP (mmHg) 85  BP Location Left Arm  BP Method Automatic  Patient Position (if appropriate) Lying  Pulse Rate 66  Resp 18  Level of Consciousness  Level of Consciousness Alert  MEWS COLOR  MEWS Score Color Green  Oxygen Therapy  SpO2 100 %  O2 Device Room Air  Pain Assessment  Pain Scale 0-10  Pain Score 2  Pain Type Acute pain;Surgical pain  Pain Location Head  Pain Orientation Left  Pain Descriptors / Indicators Headache  Pain Frequency Intermittent  Pain Onset Gradual  Pain Intervention(s) Medication (See eMAR)  MEWS Score  MEWS Temp 0  MEWS Systolic 0  MEWS Pulse 0  MEWS RR 0  MEWS LOC 0  MEWS Score 0

## 2020-08-04 NOTE — Progress Notes (Signed)
Physical Therapy Treatment Patient Details Name: Karen [name] MRN: [id number] DOB: [birth year] Today's Date: 08/04/2020    History of Present Illness Pt is a 65 y.o. F who presents worsening headache; pt states she fell about 3 weeks ago when trying to clean cobwebs off her ceiling. CT revealed moderate left sided SDH with some midline shift, small left tentorial SDH and small right SDH. S/p left parietal craniotomy for evacuation of subdural hematoma. 7/20 EVD removedSignificant PMH: breast CA, CHF, ESRD on dialysis (MWF).    PT Comments    Pt progressing well towards her physical therapy goals; very pleasant and motivated to participate. Pt reports mild incisional site pain; EVD pulled prior to session. Pt ambulating x 120 feet with a walker at a min guard assist level. Without an AD, she requires min assist for balance. Able to participate in functional strengthening exercise of serial sit to stands. 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     Follow Up Recommendations  CIR     Equipment Recommendations  Rolling walker with 5" wheels;3in1 (PT)    Recommendations for Other Services       Precautions / Restrictions Precautions Precautions: Fall Restrictions Weight Bearing Restrictions: No    Mobility  Bed Mobility Overal bed mobility: Needs Assistance Bed Mobility: Supine to Sit     Supine to sit: Supervision     General bed mobility comments: No physical assist required    Transfers Overall transfer level: Needs assistance Equipment used: Rolling walker (2 wheeled) Transfers: Sit to/from Stand Sit to Stand: Min guard         General transfer comment: Min guard to rise to stand from edge of bed and recliner, increased time to power up  Ambulation/Gait Ambulation/Gait assistance: Min guard;Min assist Gait Distance (Feet): 120 Feet Assistive device: Rolling walker (2  wheeled);None Gait Pattern/deviations: Step-through pattern;Decreased stride length;Narrow base of support Gait velocity: decreased   General Gait Details: Cues for wider BOS, upright posture, increased gait speed. Pt requiring min guard assist with walker, minA with no AD   Stairs             Wheelchair Mobility    Modified Rankin (Stroke Patients Only)       Balance Overall balance assessment: Needs assistance Sitting-balance support: Feet supported Sitting balance-Leahy Scale: Good     Standing balance support: During functional activity Standing balance-Leahy Scale: Good                              Cognition Arousal/Alertness: Awake/alert Behavior During Therapy: WFL for tasks assessed/performed Overall Cognitive Status: Within Functional Limits for tasks assessed                                        Exercises Other Exercises Other Exercises: x5 sit to stands    General Comments General comments (skin integrity, edema, etc.): VSS      Pertinent Vitals/Pain Pain Assessment: 0-10 Pain Score: 2  Pain Location: incision site Pain Descriptors / Indicators: Headache Pain Intervention(s): Monitored during session    Home Living                      Prior Function  PT Goals (current goals can now be found in the care plan section) Acute Rehab PT Goals Patient Stated Goal: [place] and do something for myself Potential to Achieve Goals: Good Progress towards PT goals: Progressing toward goals    Frequency    Min 4X/week      PT Plan Current plan remains appropriate    Co-evaluation              AM-PAC PT "6 Clicks" Mobility   Outcome Measure  Help needed turning from your back to your side while in a flat bed without using bedrails?: None Help needed moving from lying on your back to sitting on the side of a flat bed without using bedrails?: A Little Help needed moving  to and from a bed to a chair (including a wheelchair)?: A Little Help needed standing up from a chair using your arms (e.g., wheelchair or bedside chair)?: A Little Help needed to walk in hospital room?: A Little Help needed climbing 3-5 steps with a railing? : A Little 6 Click Score: 19    End of Session Equipment Utilized During Treatment: Gait belt Activity Tolerance: Patient tolerated treatment well Patient left: in chair;with call bell/phone within reach;with chair alarm set Nurse Communication: Mobility status PT Visit Diagnosis: Unsteadiness on feet (R26.81);Difficulty in walking, not elsewhere classified (R26.2)     Time: 6203-5597 PT Time Calculation (min) (ACUTE ONLY): 18 min  Charges:  $Gait Training: 8-22 mins                     Wyona Almas, PT, DPT Acute Rehabilitation Services Pager [phone] Office [phone]    Deno Etienne 08/04/2020, 11:18 AM

## 2020-08-05 NOTE — Progress Notes (Signed)
Inpatient Rehabilitation Admissions Coordinator   I met with patient at bedside and reviewed cost of care of potential Cir admit. I await insurance approval to admit her to CIR this week She is in agreement.  Danne Baxter, RN, MSN Rehab Admissions Coordinator [phone] 08/05/2020 11:36 AM

## 2020-08-05 NOTE — Care Management Important Message (Signed)
Important Message  Patient Details  Name: Karen [name] MRN: [id number] Date of Birth: 1955-11-11   Medicare Important Message Given:  Yes     [name] 08/05/2020, 3:55 PM

## 2020-08-05 NOTE — Progress Notes (Signed)
Physical Therapy Treatment Patient Details Name: Karen [name] MRN: [id number] DOB: 1955/10/25 Today's Date: 08/05/2020    History of Present Illness Pt is a 65 y.o. F who presents worsening headache; pt states she fell about 3 weeks ago when trying to clean cobwebs off her ceiling. CT revealed moderate left sided SDH with some midline shift, small left tentorial SDH and small right SDH. S/p left parietal craniotomy for evacuation of subdural hematoma. 7/20 EVD removedSignificant PMH: breast CA, CHF, ESRD on dialysis (MWF).    PT Comments    Session limited by fatigue following HD this AM. Patient ambulated 250' with HHAx1 progressing to no AD and minA for balance as patient tends to lose balance towards L with obstacle negotiation and head turns. Slow steady gait speed throughout with cues for increasing gait speed to more normal speed for patient. Continue to recommend comprehensive inpatient rehab (CIR) for post-acute therapy needs.     Follow Up Recommendations  CIR     Equipment Recommendations  Rolling [place] [employer] with 5" wheels;3in1 (PT)    Recommendations for Other Services       Precautions / Restrictions Precautions Precautions: Fall Restrictions Weight Bearing Restrictions: No    Mobility  Bed Mobility Overal bed mobility: Needs Assistance Bed Mobility: Supine to Sit;Sit to Supine     Supine to sit: Supervision Sit to supine: Supervision   General bed mobility comments: supervision for safety    Transfers Overall transfer level: Needs assistance Equipment used: 1 person hand held assist Transfers: Sit to/from Stand Sit to Stand: Min guard         General transfer comment: min guard for safety and use of HHA to steady upon standing  Ambulation/Gait Ambulation/Gait assistance: Min guard;Min assist Gait Distance (Feet): 250 Feet Assistive device: 1 person hand held assist;None Gait Pattern/deviations: Step-through pattern;Decreased stride  length;Narrow base of support Gait velocity: decreased   General Gait Details: initially ambulated with HHA and min guard with slow steady gait. Progressed to no AD but required minA for balance due to LOB towards L with navigating around obstacles in hallway and head turns.   Stairs             Wheelchair Mobility    Modified Rankin (Stroke Patients Only) Modified Rankin (Stroke Patients Only) Pre-Morbid Rankin Score: No symptoms Modified Rankin: Moderately severe disability     Balance Overall balance assessment: Needs assistance Sitting-balance support: Feet supported Sitting balance-Leahy Scale: Good     Standing balance support: No upper extremity supported;During functional activity Standing balance-Leahy Scale: Fair Standing balance comment: minA during ambulation with no AD                            Cognition Arousal/Alertness: Awake/alert Behavior During Therapy: WFL for tasks assessed/performed Overall Cognitive Status: Within Functional Limits for tasks assessed                                        Exercises      General Comments        Pertinent Vitals/Pain Pain Assessment: Faces Faces Pain Scale: Hurts little more Pain Location: incision site Pain Descriptors / Indicators: Headache Pain Intervention(s): Monitored during session;Repositioned    Home Living                      Prior Function  PT Goals (current goals can now be found in the care plan section) Acute Rehab PT Goals Patient Stated Goal: to be independent PT Goal Formulation: With patient Time For Goal Achievement: 08/17/20 Potential to Achieve Goals: Good Progress towards PT goals: Progressing toward goals    Frequency    Min 4X/week      PT Plan Current plan remains appropriate    Co-evaluation              AM-PAC PT "6 Clicks" Mobility   Outcome Measure  Help needed turning from your back to your side  while in a flat bed without using bedrails?: None Help needed moving from lying on your back to sitting on the side of a flat bed without using bedrails?: A Little Help needed moving to and from a bed to a chair (including a wheelchair)?: A Little Help needed standing up from a chair using your arms (e.g., wheelchair or bedside chair)?: A Little Help needed to walk in hospital room?: A Little Help needed climbing 3-5 steps with a railing? : A Little 6 Click Score: 19    End of Session Equipment Utilized During Treatment: Gait belt Activity Tolerance: Patient tolerated treatment well Patient left: in bed;with call bell/phone within reach;with bed alarm set Nurse Communication: Mobility status PT Visit Diagnosis: Unsteadiness on feet (R26.81);Difficulty in walking, not elsewhere classified (R26.2)     Time: 0100-7121 PT Time Calculation (min) (ACUTE ONLY): 25 min  Charges:  $Gait Training: 23-37 mins                     Mamoru Takeshita A. Gilford Rile PT, DPT Acute Rehabilitation Services Pager [phone] Office [phone]    Linna Hoff 08/05/2020, 5:07 PM

## 2020-08-05 NOTE — Procedures (Addendum)
I was present at this dialysis session. I have reviewed the session itself and made appropriate changes.   Vital signs in last 24 hours:  Temp:  [97.4 F (36.3 C)-98.2 F (36.8 C)] 97.4 F (36.3 C) (07/21 0820) Pulse Rate:  [66-89] 86 (07/21 0900) Resp:  [14-21] 14 (07/21 0820) BP: (115-135)/(63-79) 134/73 (07/21 0900) SpO2:  [93 %-100 %] 100 % (07/21 0820) Weight:  [91.3 kg] 91.3 kg (07/21 0820) Weight change:  Filed Weights   08/02/20 2309 08/05/20 0820  Weight: 89 kg 91.3 kg    Recent Labs  Lab 08/04/20 1046  NA 136  K 5.4*  CL 96*  CO2 26  GLUCOSE 147*  BUN 35*  CREATININE 6.83*  CALCIUM 8.9    Recent Labs  Lab 08/02/20 0942 08/03/20 0245  WBC 3.9* 7.2  HGB 10.4* 11.0*  HCT 31.9* 33.1*  MCV 100.3* 99.1  PLT 196 199    Scheduled Meds:  Chlorhexidine Gluconate Cloth  6 each Topical Daily   Chlorhexidine Gluconate Cloth  6 each Topical Q0600   dexamethasone  1 mg Oral Q6H   diltiazem  30 mg Oral Q M,W,F   levETIRAcetam  250 mg Oral BID   lidocaine-prilocaine  1 application Topical See admin instructions   midodrine  20 mg Oral Q M,W,F   multivitamin  1 tablet Oral Daily   pantoprazole  40 mg Oral Daily   senna  1 tablet Oral BID   sevelamer carbonate  2,400 mg Oral TID WC   sodium zirconium cyclosilicate  10 g Oral BID   Continuous Infusions:  sodium chloride     sodium chloride     PRN Meds:.sodium chloride, sodium chloride, acetaminophen **OR** acetaminophen, HYDROcodone-acetaminophen, labetalol, lidocaine (PF), morphine injection, ondansetron **OR** ondansetron (ZOFRAN) IV, ondansetron, pentafluoroprop-tetrafluoroeth, promethazine, sevelamer carbonate **AND** sevelamer carbonate     Dialysis Orders: Center: [facility]  on MWF . EDW 86kg HD Bath 2K/2Ca  Time 4 hrs Heparin 4000 units bolus. Access RUE AVF  BFR 350 DFR 500    Hectoral 5 mcg IV/HD,  Cinacalcet 30 mg with HD TIW   Assessment/Plan:  Subdural hematoma L>R - s/p left parietal craniotomy and  evacuation of hematoma.  S/p drain removal and staples placed.  Did note right SDH but was small.  Neurosurgery managing.  ESRD - off schedule due to high HD census, will plan on HD again Saturday and get back on schedule by Monday.   Hypertension/volume  - stable  Anemia  - Hgb 11, no micera  Metabolic bone disease -  continue with home meds  Nutrition - renal diet Disposition - possible CIR placement, awaiting insurance clearance.   Donetta Potts,  MD 08/05/2020, 9:16 AM

## 2020-08-06 ENCOUNTER — Inpatient Hospital Stay (HOSPITAL_COMMUNITY): Payer: Medicare Other

## 2020-08-06 NOTE — Progress Notes (Signed)
NEUROSURGERY PROGRESS NOTE Late entry from yesterday: Patient having a little more headache while in dialysis by otherwise neurologically is the same. Continue decardon and therapy  Temp:  [97.6 F (36.4 C)-98.6 F (37 C)] 97.7 F (36.5 C) (07/22 0742) Pulse Rate:  [64-86] 64 (07/22 0742) Resp:  [18-20] 18 (07/22 0742) BP: (122-154)/(63-88) 138/71 (07/22 0742) SpO2:  [97 %-100 %] 100 % (07/22 0742)  Eleonore Chiquito, NP 08/06/2020 8:35 AM

## 2020-08-06 NOTE — Plan of Care (Signed)
  Problem: Education: Goal: Knowledge of the prescribed therapeutic regimen will improve Outcome: Progressing   Problem: Clinical Measurements: Goal: Usual level of consciousness will be regained or maintained. Outcome: Progressing Goal: Neurologic status will improve Outcome: Progressing Goal: Ability to maintain intracranial pressure will improve Outcome: Progressing

## 2020-08-06 NOTE — Discharge Summary (Addendum)
Physician Discharge Summary  Patient ID: Karen [name] MRN: [id number] DOB/AGE: 06/02/1955 65 y.o.  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 The patient tolerated the procedure well and was taken to the recovery room and then to the ICU in stable condition. The hospital course was routine. There were no complications. The wound remained clean dry and intact. Pt had appropriate head soreness. No complaints of new pain or new N/T/W. The patient remained afebrile with stable vital signs, and tolerated a regular diet. The patient continued to increase activities, and pain was well controlled with oral pain medications.   Consults: nephrology  Significant Diagnostic Studies:  Results for orders placed or performed during the hospital encounter of 08/02/20  Resp Panel by RT-PCR (Flu A&B, Covid) Nasopharyngeal Swab   Specimen: Nasopharyngeal Swab; Nasopharyngeal(NP) swabs in vial transport medium  Result Value Ref Range   SARS Coronavirus 2 by RT PCR NEGATIVE NEGATIVE   Influenza A by PCR NEGATIVE NEGATIVE   Influenza B by PCR NEGATIVE NEGATIVE  MRSA Next Gen by PCR, Nasal   Specimen: Nasal Mucosa; Nasal Swab  Result Value Ref Range   MRSA by PCR Next Gen NOT DETECTED NOT DETECTED  Protime-INR  Result Value Ref Range   Prothrombin Time 13.8 11.4 - 15.2 seconds   INR 1.1 0.8 - 1.2  CBC  Result Value Ref Range   WBC 3.9 (L) 4.0 - 10.5 K/uL   RBC 3.18 (L) 3.87 - 5.11 MIL/uL   Hemoglobin 10.4 (L) 12.0 - 15.0 g/dL   HCT 31.9 (L) 36.0 - 46.0 %   MCV 100.3 (H) 80.0 - 100.0 fL   MCH 32.7 26.0 - 34.0 pg   MCHC 32.6 30.0 - 36.0 g/dL   RDW 13.6 11.5 - 15.5 %   Platelets 196 150 - 400 K/uL   nRBC  0.0 0.0 - 0.2 %  Basic metabolic panel  Result Value Ref Range   Sodium 136 135 - 145 mmol/L   Potassium 4.7 3.5 - 5.1 mmol/L   Chloride 97 (L) 98 - 111 mmol/L   CO2 28 22 - 32 mmol/L   Glucose, Bld 101 (H) 70 - 99 mg/dL   BUN 37 (H) 8 - 23 mg/dL   Creatinine, Ser 7.58 (H) 0.44 - 1.00 mg/dL   Calcium 8.9 8.9 - 10.3 mg/dL   GFR, Estimated 6 (L) >60 mL/min   Anion gap 11 5 - 15  Basic metabolic panel  Result Value Ref Range   Sodium 136 135 - 145 mmol/L   Potassium 6.2 (H) 3.5 - 5.1 mmol/L   Chloride 98 98 - 111 mmol/L   CO2 24 22 - 32 mmol/L   Glucose, Bld 116 (H) 70 - 99 mg/dL   BUN 45 (H) 8 - 23 mg/dL   Creatinine, Ser 9.04 (H) 0.44 - 1.00 mg/dL   Calcium 9.2 8.9 - 10.3 mg/dL   GFR, Estimated 4 (L) >60 mL/min   Anion gap 14 5 - 15  CBC  Result Value Ref Range   WBC 7.2 4.0 - 10.5 K/uL   RBC 3.34 (L) 3.87 - 5.11 MIL/uL   Hemoglobin 11.0 (L) 12.0 - 15.0 g/dL   HCT 33.1 (L) 36.0 - 46.0 %   MCV 99.1  80.0 - 100.0 fL   MCH 32.9 26.0 - 34.0 pg   MCHC 33.2 30.0 - 36.0 g/dL   RDW 13.3 11.5 - 15.5 %   Platelets 199 150 - 400 K/uL   nRBC 0.0 0.0 - 0.2 %  Hepatitis B surface antigen  Result Value Ref Range   Hepatitis B Surface Ag NON REACTIVE NON REACTIVE  Basic metabolic panel  Result Value Ref Range   Sodium 136 135 - 145 mmol/L   Potassium 5.4 (H) 3.5 - 5.1 mmol/L   Chloride 96 (L) 98 - 111 mmol/L   CO2 26 22 - 32 mmol/L   Glucose, Bld 147 (H) 70 - 99 mg/dL   BUN 35 (H) 8 - 23 mg/dL   Creatinine, Ser 6.83 (H) 0.44 - 1.00 mg/dL   Calcium 8.9 8.9 - 10.3 mg/dL   GFR, Estimated 6 (L) >60 mL/min   Anion gap 14 5 - 15  Type and screen [place]  Result Value Ref Range   ABO/RH(D) B POS    Antibody Screen NEG    Sample Expiration      08/05/2020,2359 Performed at [facility], [address], [place],  [address]     CT HEAD WO CONTRAST  Result Date: 08/06/2020 CLINICAL DATA:  Follow-up subdural hematoma. EXAM: CT HEAD WITHOUT CONTRAST  TECHNIQUE: Contiguous axial images were obtained from the base of the skull through the vertex without intravenous contrast. COMPARISON:  08/03/2020 FINDINGS: Brain: Sequelae of left-sided craniotomy for subdural hematoma evacuation are again identified. The subdural drain has been removed. Pneumocephalus has decreased. A small amount of residual subdural hematoma over the left cerebral convexity and along the left tentorium and posterior falx has slightly decreased. A low-density subdural collection over the right cerebral convexity is unchanged, measuring up to 6 mm in thickness. There is no significant midline shift. No new intracranial hemorrhage, acute infarct, or mass is identified. The ventricles are unchanged in size. Vascular: Calcified atherosclerosis at the skull base. Skull: Left frontal craniotomy with overlying mild scalp soft tissue swelling and small volume gas. Sinuses/Orbits: Visualized paranasal sinuses and mastoid air cells are clear. Visualized orbits are unremarkable. Other: None. IMPRESSION: 1. Sequelae of left subdural hematoma evacuation with interval drain removal, decreased pneumocephalus, and slightly decreased small volume residual subdural hematoma. No residual midline shift. 2. Unchanged small right-sided subdural hematoma. Electronically Signed   By: Logan Bores M.D.   On: 08/06/2020 18:25   CT HEAD WO CONTRAST  Result Date: 08/03/2020 CLINICAL DATA:  Subdural hemorrhage follow-up. EXAM: CT HEAD WITHOUT CONTRAST TECHNIQUE: Contiguous axial images were obtained from the base of the skull through the vertex without intravenous contrast. COMPARISON:  08/02/2020. FINDINGS: Brain: Status post left frontal craniotomy for subdural hematoma evacuation and placement of a drain. The drain tip is within the posterior aspect of the left convexity subdural hematoma. Suspected Gel-Foam subjacent to the craniotomy defect. The left convexity hematoma is overall decreased in volume with similar  maximal thickness of 11 mm. Some areas of new internal hyperdensity may represent interval hemorrhage. Similar hemorrhage layering along the left tentorial leaflet. Increased pneumocephalus subjacent to the craniotomy and along the anti dependent left frontal convexity. Midline shift is improved, now 2 mm at the foramen of Monro. No hydrocephalus. No evidence of acute large vascular territory infarct. Similar smaller (up to 6 mm thickness) lower density right subdural collection. No mass lesion. Partially empty sella. Vascular: Calcific atherosclerosis. No hyperdense vessel identified. Skull: Left frontal  craniotomy. Sinuses/Orbits: Clear sinuses.  Unremarkable orbits. Other: No mastoid effusions. IMPRESSION: 1. Status post left frontal craniotomy for subdural hematoma evacuation and placement of a drain. The left convexity hematoma is overall decreased in volume with similar maximal thickness of 11 mm. Some areas of new internal hyperdensity may represent interval hemorrhage and warrant continued attention on follow-up. 2. Improved mass effect with 2 mm of rightward midline shift at the foramen of Monro, previously 4 mm. 3. Similar hemorrhage layering along the left tentorial leaflet and similar smaller (up to 6 mm thickness) right subdural hematoma. Electronically Signed   By: Margaretha Sheffield MD   On: 08/03/2020 08:11   CT Head Wo Contrast  Result Date: 08/02/2020 CLINICAL DATA:  Headache after fall 1 month ago. EXAM: CT HEAD WITHOUT CONTRAST TECHNIQUE: Contiguous axial images were obtained from the base of the skull through the vertex without intravenous contrast. COMPARISON:  July 15, 2020. FINDINGS: Brain: Large left subdural hematoma is noted resulting in 4 mm of left-to-right midline shift. Maximum thickness is 11 mm. Small left tentorial subdural hematoma is noted as well. Ventricular size is within normal limits. Smaller right subdural hematoma is noted. Vascular: No hyperdense vessel or unexpected  calcification. Skull: Normal. Negative for fracture or focal lesion. Sinuses/Orbits: No acute finding. Other: None. IMPRESSION: Large left subdural hematoma is noted resulting in 4 mm of left-to-right midline shift. Small left tentorial subdural hematoma is also noted, as well as smaller right subdural hematoma. Critical Value/emergent results were called by telephone at the time of interpretation on 08/02/2020 at 12:09 pm to provider [facility] , who verbally acknowledged these results. Electronically Signed   By: Marijo Conception M.D.   On: 08/02/2020 12:09   CT HEAD WO CONTRAST  Result Date: 07/16/2020 CLINICAL DATA:  Fall, head injury. On blood thinner. History breast cancer EXAM: CT HEAD WITHOUT CONTRAST TECHNIQUE: Contiguous axial images were obtained from the base of the skull through the vertex without intravenous contrast. COMPARISON:  CT head 01/27/2017 FINDINGS: Brain: No evidence of acute infarction, hemorrhage, hydrocephalus, extra-axial collection or mass lesion/mass effect enlargement of the sella which is filled with CSF. Small pituitary. Findings compatible with empty sella, unchanged from the prior study. Vascular: Negative for hyperdense vessel Skull: Negative Sinuses/Orbits: Negative Other: None IMPRESSION: No acute abnormality no change from the prior CT. Electronically Signed   By: Franchot Gallo M.D.   On: 07/16/2020 08:42   MM 3D SCREEN BREAST UNI LEFT  Result Date: 07/22/2020 CLINICAL DATA:  Screening. EXAM: DIGITAL SCREENING UNILATERAL LEFT MAMMOGRAM WITH CAD AND TOMOSYNTHESIS TECHNIQUE: Left screening digital craniocaudal and mediolateral oblique mammograms were obtained. Left screening digital breast tomosynthesis was performed. The images were evaluated with computer-aided detection. COMPARISON:  Previous exam(s). ACR Breast Density Category c: The breast tissue is heterogeneously dense, which may obscure small masses. FINDINGS: There are no findings suspicious for malignancy.  IMPRESSION: No mammographic evidence of malignancy. A result letter of this screening mammogram will be mailed directly to the patient. RECOMMENDATION: Screening mammogram in one year. (Code:SM-B-01Y) BI-RADS CATEGORY  1: Negative. Electronically Signed   By: Franki Cabot M.D.   On: 07/22/2020 15:00   Antibiotics:  Anti-infectives (From admission, onward)    Start     Dose/Rate Route Frequency Ordered Stop   08/02/20 2200  ceFAZolin (ANCEF) IVPB 1 g/50 mL premix        1 g 100 mL/hr over 30 Minutes Intravenous Every 8 hours 08/02/20 2107 08/03/20 0553   08/02/20  1448  ceFAZolin (ANCEF) 2-4 GM/100ML-% IVPB       Note to Pharmacy: Claybon Jabs   : cabinet override      08/02/20 1448 08/02/20 2108       Discharge Exam: Blood pressure 127/72, pulse 67, temperature 98.4 F (36.9 C), temperature source Oral, resp. rate 14, height 5\' 1"  (1.549 m), weight 91.3 kg, SpO2 95 %. Neurologic: Grossly normal Ambulating and voiding well, incision cdi with staples   Discharge Medications:   Allergies as of 08/06/2020   No Known Allergies      Medication List     STOP taking these medications    HYDROcodone-acetaminophen 5-325 MG tablet Commonly known as: Norco       TAKE these medications    acetaminophen 500 MG tablet Commonly known as: TYLENOL Take 1,000 mg by mouth daily as needed for mild pain, moderate pain or headache.   amoxicillin 500 MG capsule Commonly known as: AMOXIL Take 2,000 mg by mouth See admin instructions. Take 4 capsules (2000 mg) by mouth one hour prior to dental procedures   clobetasol cream 0.05 % Commonly known as: TEMOVATE Apply 1 application topically daily as needed (for rash).   diltiazem 30 MG tablet Commonly known as: CARDIZEM Take 1 tablet (30 mg total) by mouth every Monday, Wednesday, and Friday.   ethyl chloride spray Apply 1 application topically every Monday, Wednesday, and Friday with hemodialysis.   fluticasone 50 MCG/ACT nasal  spray Commonly known as: FLONASE Place 1 spray into both nostrils daily as needed for allergies.   lidocaine-prilocaine cream Commonly known as: EMLA Apply 1 application topically See admin instructions. Apply topically Monday, Wednesday and Friday before dialysis   midodrine 10 MG tablet Commonly known as: PROAMATINE Take 20 mg by mouth every Monday, Wednesday, and Friday. Prior to dialysis   multivitamin Tabs tablet Take 1 tablet by mouth daily.   ondansetron 8 MG tablet Commonly known as: ZOFRAN Take 8 mg by mouth every 8 (eight) hours as needed for nausea or vomiting.   pantoprazole 40 MG tablet Commonly known as: PROTONIX TAKE 1 TABLET (40 MG TOTAL) BY MOUTH DAILY.   prochlorperazine 10 MG tablet Commonly known as: COMPAZINE Take 10 mg by mouth every 6 (six) hours as needed (Nausea or vomiting).   sevelamer carbonate 800 MG tablet Commonly known as: RENVELA Take 1,600-2,400 mg by mouth See admin instructions. Take 3 tablets (2400 mg) by mouth with each meal & 2 tablets (1600 mg) by mouth with each snack.   simvastatin 20 MG tablet Commonly known as: ZOCOR TAKE 1 TABLET BY MOUTH EVERYDAY AT BEDTIME What changed: See the new instructions.        Disposition: CIR   Final Dx: Left sided craniotomy for evacuation of acute on chronic subdural hematoma       Signed: Ocie Cornfield [facility] 08/06/2020, 6:30 PM

## 2020-08-06 NOTE — Progress Notes (Signed)
Inpatient Rehab Admissions Coordinator:    I continue to await insurance auth for potential CIR admit. I do not have a bed for this Pt. This morning but could potentially admit her this afternoon or evening, if insurance approves.   Clemens Catholic, [place], [place] Admissions Coordinator  [phone] ([place]) [phone] (office)

## 2020-08-06 NOTE — Plan of Care (Signed)
  Problem: Education: Goal: Knowledge of General Education information will improve Description: Including pain rating scale, medication(s)/side effects and non-pharmacologic comfort measures Outcome: Progressing   Problem: Clinical Measurements: Goal: Ability to maintain clinical measurements within normal limits will improve Outcome: Progressing Goal: Will remain free from infection Outcome: Progressing Goal: Diagnostic test results will improve Outcome: Progressing Goal: Respiratory complications will improve Outcome: Progressing Goal: Cardiovascular complication will be avoided Outcome: Progressing   Problem: Coping: Goal: Level of anxiety will decrease Outcome: Progressing   Problem: Safety: Goal: Ability to remain free from injury will improve Outcome: Progressing   

## 2020-08-06 NOTE — Progress Notes (Signed)
Occupational Therapy Treatment Patient Details Name: Karen [name] MRN: [id number] DOB: [birth year] Today's Date: 08/06/2020    History of present illness Pt is a 65 y.o. F who presents worsening headache; pt states she fell about 3 weeks ago when trying to clean cobwebs off her ceiling. CT revealed moderate left sided SDH with some midline shift, small left tentorial SDH and small right SDH. S/p left parietal craniotomy for evacuation of subdural hematoma. 7/20 EVD removedSignificant PMH: breast CA, CHF, ESRD on dialysis (MWF).   OT comments  Pt progressing towards established OT goals and continues to be highly motivated to participate in therapy. Focus session on challenging higher level balance performing dynamic reaching tasks in preparation for IADLs of home making and serving as a caregiver. Pt continues to present with decreased dynamic balance. Due to pt motivation and significant change in functional status, continue to recommend CIR for intensive OT services to optimize independence in ADLs and IADLs. Will follow acutely as admitted.    Follow Up Recommendations  CIR    Equipment Recommendations  3 in 1 bedside commode    Recommendations for Other Services Rehab consult    Precautions / Restrictions Precautions Precautions: Fall Restrictions Weight Bearing Restrictions: No       Mobility Bed Mobility Overal bed mobility: Modified Independent Bed Mobility: Supine to Sit           General bed mobility comments: Mod I for increased time.    Transfers Overall transfer level: Needs assistance Equipment used: None Transfers: Sit to/from Stand Sit to Stand: Supervision         General transfer comment: supervision for safety, no physical assistance required.    Balance Overall balance assessment: Needs assistance Sitting-balance support: Feet supported Sitting balance-Leahy Scale: Good     Standing balance support: No upper extremity  supported;During functional activity Standing balance-Leahy Scale: Good Standing balance comment: Min Guard A with dynamic reaching task                           ADL either performed or assessed with clinical judgement   ADL Overall ADL's : Needs assistance/impaired                         Toilet Transfer: Min Psychiatric nurse Details (indicate cue type and reason): Simulated toilet transfer from EOB         Functional mobility during ADLs: Min guard General ADL Comments: Focus session on higher level balance activities in preparation for return to IADLs and being a primary caregiver to her husband. Performing functional mobility in hall with min Guard A.     Vision       Perception     Praxis      Cognition Arousal/Alertness: Awake/alert Behavior During Therapy: WFL for tasks assessed/performed Overall Cognitive Status: Within Functional Limits for tasks assessed                                 General Comments: Pt participating naming task going from animals that start with A, B, C...etc while performing functional mobility in the hallway. Pt reporting she is trying to rest, but has difficulty trying not to think about what is going on at home.        Exercises Exercises: Other exercises Other Exercises Other Exercises: Performing dynamic reaching task outside of BOS  taking sticky notes off of wall. Reaching across midline with UEs. Other Exercises: Simulating IADL putting up dishes from dishwasher. Pt performing dynamic reaching task standing on unstable surface, reaching to pick up tape roll off of flash light outside of BOS, transferring tape to opposite hand, and reaching to place tape roll on hook on wall. Other Exercises: squatting to pick up item off of floor x3   Shoulder Instructions       General Comments VSS. Pt pleasant and conversational throughout.    Pertinent Vitals/ Pain       Pain Assessment: Faces Faces  Pain Scale: Hurts little more Pain Location: incision site Pain Descriptors / Indicators: Headache Pain Intervention(s): Monitored during session  Home Living                                          Prior Functioning/Environment              Frequency  Min 3X/week        Progress Toward Goals  OT Goals(current goals can now be found in the care plan section)  Progress towards OT goals: Progressing toward goals  Acute Rehab OT Goals Patient Stated Goal: to be independent OT Goal Formulation: With patient Time For Goal Achievement: 08/17/20 Potential to Achieve Goals: Good ADL Goals Pt Will Perform Upper Body Bathing: with supervision;sitting Pt Will Perform Lower Body Bathing: with supervision;sit to/from stand Pt Will Transfer to Toilet: with min guard assist;ambulating;bedside commode Additional ADL Goal #1: pt will complete bed mobility min guard (A) as precursor to adls.  Plan Discharge plan remains appropriate    Co-evaluation                 AM-PAC OT "6 Clicks" Daily Activity     Outcome Measure   Help from another person eating meals?: A Little Help from another person taking care of personal grooming?: A Little Help from another person toileting, which includes using toliet, bedpan, or urinal?: A Little Help from another person bathing (including washing, rinsing, drying)?: A Little Help from another person to put on and taking off regular upper body clothing?: A Little Help from another person to put on and taking off regular lower body clothing?: A Little 6 Click Score: 18    End of Session Equipment Utilized During Treatment: Gait belt  OT Visit Diagnosis: Unsteadiness on feet (R26.81);Muscle weakness (generalized) (M62.81)   Activity Tolerance Patient tolerated treatment well   Patient Left in bed;with call bell/phone within reach;with bed alarm set   Nurse Communication Mobility status        Time: 3419-3790 OT  Time Calculation (min): 36 min  Charges: OT General Charges $OT Visit: 1 Visit OT Treatments $Therapeutic Activity: 23-37 mins  Shanda Howells, OTDS    Shanda Howells 08/06/2020, 4:46 PM

## 2020-08-06 NOTE — Progress Notes (Signed)
Inpatient Rehab Admissions Coordinator:   Peer to Peer was completed with Pt.'s insurance. Insurance MD told our physician Pt. Was approved for CIR, but I await authorization details before I can admit her. Pt. Will likely transfer to CIR  tomorrow but [facility] working this weekend Gay[place] Curry) to confirm once authorization details are received.  Clemens Catholic, [place], [place] Admissions Coordinator  [phone] ([place]) [phone] (office)

## 2020-08-06 NOTE — Progress Notes (Signed)
Patient ID: Karen [name], female   DOB: 01-09-1956, 65 y.o.   MRN: [id number] S:Still complaining of HA and nausea today. O:BP 138/71 (BP Location: Left Arm)   Pulse 64   Temp 97.7 F (36.5 C) (Oral)   Resp 18   Ht 5\' 1"  (1.549 m)   Wt 91.3 kg   LMP  (LMP Unknown)   SpO2 100%   BMI 38.03 kg/m   Intake/Output Summary (Last 24 hours) at 08/06/2020 1054 Last data filed at 08/05/2020 1130 Gross per 24 hour  Intake --  Output 1185 ml  Net -1185 ml   Intake/Output: I/O last 3 completed shifts: In: 240 [P.O.:240] Out: 1185 [Other:1185]  Intake/Output this shift:  No intake/output data recorded. Weight change:  Gen: NAD, fatigued CVS:RRR Resp: CTA Abd: +Bs, soft, NT/ND Ext: no edema, RUE AVF +T/B  Recent Labs  Lab 08/02/20 0942 08/03/20 0245 08/04/20 1046  NA 136 136 136  K 4.7 6.2* 5.4*  CL 97* 98 96*  CO2 28 24 26   GLUCOSE 101* 116* 147*  BUN 37* 45* 35*  CREATININE 7.58* 9.04* 6.83*  CALCIUM 8.9 9.2 8.9   Liver Function Tests: No results for input(s): AST, ALT, ALKPHOS, BILITOT, PROT, ALBUMIN in the last 168 hours. No results for input(s): LIPASE, AMYLASE in the last 168 hours. No results for input(s): AMMONIA in the last 168 hours. CBC: Recent Labs  Lab 08/02/20 0942 08/03/20 0245  WBC 3.9* 7.2  HGB 10.4* 11.0*  HCT 31.9* 33.1*  MCV 100.3* 99.1  PLT 196 199   Cardiac Enzymes: No results for input(s): CKTOTAL, CKMB, CKMBINDEX, TROPONINI in the last 168 hours. CBG: No results for input(s): GLUCAP in the last 168 hours.  Iron Studies: No results for input(s): IRON, TIBC, TRANSFERRIN, FERRITIN in the last 72 hours. Studies/Results: No results found.  Chlorhexidine Gluconate Cloth  6 each Topical Daily   Chlorhexidine Gluconate Cloth  6 each Topical Q0600   diltiazem  30 mg Oral Q M,W,F   levETIRAcetam  250 mg Oral BID   lidocaine-prilocaine  1 application Topical See admin instructions   midodrine  20 mg Oral Q M,W,F   multivitamin  1 tablet  Oral Daily   pantoprazole  40 mg Oral Daily   senna  1 tablet Oral BID   sevelamer carbonate  2,400 mg Oral TID WC   sodium zirconium cyclosilicate  10 g Oral BID    BMET    Component Value Date/Time   NA 136 08/04/2020 1046   K 5.4 (H) 08/04/2020 1046   CL 96 (L) 08/04/2020 1046   CO2 26 08/04/2020 1046   GLUCOSE 147 (H) 08/04/2020 1046   BUN 35 (H) 08/04/2020 1046   CREATININE 6.83 (H) 08/04/2020 1046   CREATININE 7.25 (HH) 08/06/2018 0751   CREATININE 5.71 (H) 02/27/2014 0900   CALCIUM 8.9 08/04/2020 1046   CALCIUM 9.0 01/28/2014 1454   GFRNONAA 6 (L) 08/04/2020 1046   GFRNONAA 5 (L) 08/06/2018 0751   GFRAA 5 (L) 04/15/2019 1709   GFRAA 6 (L) 08/06/2018 0751   CBC    Component Value Date/Time   WBC 7.2 08/03/2020 0245   RBC 3.34 (L) 08/03/2020 0245   HGB 11.0 (L) 08/03/2020 0245   HGB 9.6 (L) 08/06/2018 0751   HCT 33.1 (L) 08/03/2020 0245   PLT 199 08/03/2020 0245   PLT 176 08/06/2018 0751   MCV 99.1 08/03/2020 0245   MCH 32.9 08/03/2020 0245   MCHC 33.2 08/03/2020 0245  RDW 13.3 08/03/2020 0245   LYMPHSABS 0.7 08/06/2018 0751   MONOABS 0.5 08/06/2018 0751   EOSABS 0.3 08/06/2018 0751   BASOSABS 0.1 08/06/2018 0751      Dialysis Orders: Center: [facility] ([facility])  on MWF . EDW 86kg HD Bath 2K/2Ca  Time 4 hrs Heparin 4000 units bolus. Access RUE AVF  BFR 350 DFR 500    Hectoral 5 mcg IV/HD,  Cinacalcet 30 mg with HD TIW   Assessment/Plan:  Subdural hematoma L>R - s/p left parietal craniotomy and evacuation of hematoma.  S/p drain removal and staples placed.  Did note right SDH but was small.  Neurosurgery managing.  ESRD - off schedule due to high HD census, will plan on HD again Saturday and get back on schedule by Monday.   Hypertension/volume  - stable  Anemia  - Hgb 11, no micera  Metabolic bone disease -  continue with home meds  Nutrition - renal diet Disposition - possible CIR placement, awaiting insurance clearance.  Donetta Potts, MD [employer] [phone]

## 2020-08-06 NOTE — Progress Notes (Signed)
Subjective: Patient reports some headaches but much better than yesterday.  Objective: Vital signs in last 24 hours: Temp:  [97.6 F (36.4 C)-98.6 F (37 C)] 97.7 F (36.5 C) (07/22 0742) Pulse Rate:  [64-86] 64 (07/22 0742) Resp:  [18-20] 18 (07/22 0742) BP: (122-154)/(63-88) 138/71 (07/22 0742) SpO2:  [97 %-100 %] 100 % (07/22 0742)  Intake/Output from previous day: 07/21 0701 - 07/22 0700 In: 240 [P.O.:240] Out: 1185  Intake/Output this shift: No intake/output data recorded.  Neurologic: Grossly normal  Lab Results: Lab Results  Component Value Date   WBC 7.2 08/03/2020   HGB 11.0 (L) 08/03/2020   HCT 33.1 (L) 08/03/2020   MCV 99.1 08/03/2020   PLT 199 08/03/2020   Lab Results  Component Value Date   INR 1.1 08/02/2020   BMET Lab Results  Component Value Date   NA 136 08/04/2020   K 5.4 (H) 08/04/2020   CL 96 (L) 08/04/2020   CO2 26 08/04/2020   GLUCOSE 147 (H) 08/04/2020   BUN 35 (H) 08/04/2020   CREATININE 6.83 (H) 08/04/2020   CALCIUM 8.9 08/04/2020    Studies/Results: No results found.  Assessment/Plan: Continue therapies today. D/c decadron. Will order head CT today   LOS: 4 days    Karen [name] [facility] 08/06/2020, 8:36 AM

## 2020-08-06 NOTE — Plan of Care (Signed)
  Problem: Education: Goal: Knowledge of the prescribed therapeutic regimen will improve Outcome: Progressing   Problem: Clinical Measurements: Goal: Usual level of consciousness will be regained or maintained. Outcome: Progressing Goal: Neurologic status will improve Outcome: Progressing Goal: Ability to maintain intracranial pressure will improve Outcome: Progressing   Problem: Skin Integrity: Goal: Demonstration of wound healing without infection will improve Outcome: Progressing   

## 2020-08-06 NOTE — Progress Notes (Signed)
Physical Therapy Treatment Patient Details Name: Karen [name] MRN: [id number] DOB: 01-05-1956 Today's Date: 08/06/2020    History of Present Illness Pt is a 65 y.o. F who presents worsening headache; pt states she fell about 3 weeks ago when trying to clean cobwebs off her ceiling. CT revealed moderate left sided SDH with some midline shift, small left tentorial SDH and small right SDH. S/p left parietal craniotomy for evacuation of subdural hematoma. 7/20 EVD removedSignificant PMH: breast CA, CHF, ESRD on dialysis (MWF).    PT Comments    Patient progressing towards physical therapy goals. Patient ambulates with slow guarded gait with limited arm swing and head movement. Cues for head turns requiring minA for balance. Seems to have mild L inattention during ambulation with gliding closely by obstacles in hallway with no awareness. Patient negotiated 4 stairs with R hand rail and min guard for safety. Continue to recommend comprehensive inpatient rehab (CIR) for post-acute therapy needs.     Follow Up Recommendations  CIR     Equipment Recommendations  Rolling [employer] with 5" wheels;3in1 (PT)    Recommendations for Other Services       Precautions / Restrictions Precautions Precautions: Fall Restrictions Weight Bearing Restrictions: No    Mobility  Bed Mobility Overal bed mobility: Modified Independent                  Transfers Overall transfer level: Needs assistance Equipment used: None Transfers: Sit to/from Stand Sit to Stand: Supervision         General transfer comment: supervision for safety, no physical assistance required. Very guarded, slow rise into standing  Ambulation/Gait Ambulation/Gait assistance: Min guard;Min assist Gait Distance (Feet): 250 Feet Assistive device: None Gait Pattern/deviations: Step-through pattern;Decreased stride length;Narrow base of support Gait velocity: decreased   General Gait Details: slow guarded gait  throughout with limited arm swing and head motion with open environment. MinA with introduction of dynamic activities (i.e head turns, obstacle negotiation). Mild L inattention noted with tending to glide closely by obstacles on L without awareness   Stairs Stairs: Yes Stairs assistance: Min guard Stair Management: One rail Right;Step to pattern;Forwards Number of Stairs: 4 General stair comments: min guard for safety with use of R hand rail   Wheelchair Mobility    Modified Rankin (Stroke Patients Only) Modified Rankin (Stroke Patients Only) Pre-Morbid Rankin Score: No symptoms Modified Rankin: Moderately severe disability     Balance Overall balance assessment: Needs assistance Sitting-balance support: Feet supported Sitting balance-Leahy Scale: Good     Standing balance support: No upper extremity supported;During functional activity Standing balance-Leahy Scale: Fair Standing balance comment: minA during ambulation with dynamic activities                            Cognition Arousal/Alertness: Awake/alert Behavior During Therapy: WFL for tasks assessed/performed Overall Cognitive Status: Within Functional Limits for tasks assessed                                        Exercises      General Comments        Pertinent Vitals/Pain Pain Assessment: Faces Faces Pain Scale: Hurts little more Pain Location: incision site Pain Descriptors / Indicators: Headache Pain Intervention(s): Monitored during session;Repositioned    Home Living  Prior Function            PT Goals (current goals can now be found in the care plan section) Acute Rehab PT Goals Patient Stated Goal: to be independent PT Goal Formulation: With patient Time For Goal Achievement: 08/17/20 Potential to Achieve Goals: Good Progress towards PT goals: Progressing toward goals    Frequency    Min 4X/week      PT Plan Current plan  remains appropriate    Co-evaluation              AM-PAC PT "6 Clicks" Mobility   Outcome Measure  Help needed turning from your back to your side while in a flat bed without using bedrails?: None Help needed moving from lying on your back to sitting on the side of a flat bed without using bedrails?: None Help needed moving to and from a bed to a chair (including a wheelchair)?: A Little Help needed standing up from a chair using your arms (e.g., wheelchair or bedside chair)?: A Little Help needed to walk in hospital room?: A Little Help needed climbing 3-5 steps with a railing? : A Little 6 Click Score: 20    End of Session Equipment Utilized During Treatment: Gait belt Activity Tolerance: Patient tolerated treatment well Patient left: in bed;with call bell/phone within reach;with bed alarm set Nurse Communication: Mobility status PT Visit Diagnosis: Unsteadiness on feet (R26.81);Difficulty in walking, not elsewhere classified (R26.2)     Time: 1325-1350 PT Time Calculation (min) (ACUTE ONLY): 25 min  Charges:  $Gait Training: 23-37 mins                     Dearies Meikle A. Gilford Rile PT, DPT Acute Rehabilitation Services Pager [phone] Office [phone]    Linna Hoff 08/06/2020, 3:22 PM

## 2020-08-07 ENCOUNTER — Inpatient Hospital Stay (HOSPITAL_COMMUNITY)
Admission: EM | Admit: 2020-08-07 | Discharge: 2020-08-20 | DRG: 091 | Disposition: A | Discharge status: Home Health | Payer: Medicare Other | Source: Intra-hospital | Attending: Physical Medicine & Rehabilitation | Admitting: Physical Medicine & Rehabilitation

## 2020-08-07 ENCOUNTER — Encounter (HOSPITAL_COMMUNITY): Payer: Self-pay | Admitting: Physical Medicine & Rehabilitation

## 2020-08-07 ENCOUNTER — Other Ambulatory Visit: Payer: Self-pay

## 2020-08-07 DIAGNOSIS — N186 End stage renal disease: Secondary | ICD-10-CM | POA: Diagnosis present

## 2020-08-07 DIAGNOSIS — T8612 Kidney transplant failure: Secondary | ICD-10-CM | POA: Diagnosis present

## 2020-08-07 DIAGNOSIS — Z9049 Acquired absence of other specified parts of digestive tract: Secondary | ICD-10-CM | POA: Diagnosis not present

## 2020-08-07 DIAGNOSIS — K219 Gastro-esophageal reflux disease without esophagitis: Secondary | ICD-10-CM | POA: Diagnosis present

## 2020-08-07 DIAGNOSIS — I2510 Atherosclerotic heart disease of native coronary artery without angina pectoris: Secondary | ICD-10-CM | POA: Diagnosis present

## 2020-08-07 DIAGNOSIS — Z841 Family history of disorders of kidney and ureter: Secondary | ICD-10-CM | POA: Diagnosis not present

## 2020-08-07 DIAGNOSIS — R269 Unspecified abnormalities of gait and mobility: Secondary | ICD-10-CM | POA: Diagnosis present

## 2020-08-07 DIAGNOSIS — S065X0A Traumatic subdural hemorrhage without loss of consciousness, initial encounter: Secondary | ICD-10-CM | POA: Diagnosis not present

## 2020-08-07 DIAGNOSIS — S065X9A Traumatic subdural hemorrhage with loss of consciousness of unspecified duration, initial encounter: Secondary | ICD-10-CM | POA: Diagnosis present

## 2020-08-07 DIAGNOSIS — I4891 Unspecified atrial fibrillation: Secondary | ICD-10-CM | POA: Diagnosis not present

## 2020-08-07 DIAGNOSIS — E875 Hyperkalemia: Secondary | ICD-10-CM | POA: Diagnosis present

## 2020-08-07 DIAGNOSIS — I959 Hypotension, unspecified: Secondary | ICD-10-CM | POA: Diagnosis present

## 2020-08-07 DIAGNOSIS — I132 Hypertensive heart and chronic kidney disease with heart failure and with stage 5 chronic kidney disease, or end stage renal disease: Secondary | ICD-10-CM | POA: Diagnosis present

## 2020-08-07 DIAGNOSIS — D631 Anemia in chronic kidney disease: Secondary | ICD-10-CM | POA: Diagnosis present

## 2020-08-07 DIAGNOSIS — M1990 Unspecified osteoarthritis, unspecified site: Secondary | ICD-10-CM | POA: Diagnosis present

## 2020-08-07 DIAGNOSIS — E669 Obesity, unspecified: Secondary | ICD-10-CM | POA: Diagnosis present

## 2020-08-07 DIAGNOSIS — K5900 Constipation, unspecified: Secondary | ICD-10-CM | POA: Diagnosis present

## 2020-08-07 DIAGNOSIS — D649 Anemia, unspecified: Secondary | ICD-10-CM | POA: Diagnosis not present

## 2020-08-07 DIAGNOSIS — E7800 Pure hypercholesterolemia, unspecified: Secondary | ICD-10-CM | POA: Diagnosis not present

## 2020-08-07 DIAGNOSIS — I5032 Chronic diastolic (congestive) heart failure: Secondary | ICD-10-CM | POA: Diagnosis present

## 2020-08-07 DIAGNOSIS — Z90710 Acquired absence of both cervix and uterus: Secondary | ICD-10-CM

## 2020-08-07 DIAGNOSIS — R079 Chest pain, unspecified: Secondary | ICD-10-CM | POA: Diagnosis not present

## 2020-08-07 DIAGNOSIS — Z9011 Acquired absence of right breast and nipple: Secondary | ICD-10-CM

## 2020-08-07 DIAGNOSIS — G44319 Acute post-traumatic headache, not intractable: Secondary | ICD-10-CM | POA: Diagnosis present

## 2020-08-07 DIAGNOSIS — S065XAA Traumatic subdural hemorrhage with loss of consciousness status unknown, initial encounter: Secondary | ICD-10-CM | POA: Diagnosis present

## 2020-08-07 DIAGNOSIS — R0789 Other chest pain: Secondary | ICD-10-CM | POA: Diagnosis not present

## 2020-08-07 DIAGNOSIS — M898X9 Other specified disorders of bone, unspecified site: Secondary | ICD-10-CM | POA: Diagnosis present

## 2020-08-07 DIAGNOSIS — Z853 Personal history of malignant neoplasm of breast: Secondary | ICD-10-CM | POA: Diagnosis not present

## 2020-08-07 DIAGNOSIS — Z6837 Body mass index (BMI) 37.0-37.9, adult: Secondary | ICD-10-CM | POA: Diagnosis not present

## 2020-08-07 DIAGNOSIS — Z8249 Family history of ischemic heart disease and other diseases of the circulatory system: Secondary | ICD-10-CM

## 2020-08-07 DIAGNOSIS — I10 Essential (primary) hypertension: Secondary | ICD-10-CM | POA: Diagnosis not present

## 2020-08-07 DIAGNOSIS — Z992 Dependence on renal dialysis: Secondary | ICD-10-CM | POA: Diagnosis not present

## 2020-08-07 DIAGNOSIS — Z79899 Other long term (current) drug therapy: Secondary | ICD-10-CM

## 2020-08-07 DIAGNOSIS — S065X2S Traumatic subdural hemorrhage with loss of consciousness of 31 minutes to 59 minutes, sequela: Secondary | ICD-10-CM | POA: Diagnosis not present

## 2020-08-07 DIAGNOSIS — I4892 Unspecified atrial flutter: Secondary | ICD-10-CM | POA: Diagnosis not present

## 2020-08-07 DIAGNOSIS — S065X0D Traumatic subdural hemorrhage without loss of consciousness, subsequent encounter: Secondary | ICD-10-CM | POA: Diagnosis not present

## 2020-08-07 MED ORDER — SENNA 8.6 MG PO TABS
1.0000 | ORAL_TABLET | Freq: Two times a day (BID) | ORAL | Status: DC
  Administered 2020-08-07 – 2020-08-20 (×26): 8.6 mg via ORAL
  Filled 2020-08-07 (×26): qty 1

## 2020-08-07 MED ORDER — DILTIAZEM HCL 60 MG PO TABS
30.0000 mg | ORAL_TABLET | ORAL | Status: DC
  Administered 2020-08-09 – 2020-08-13 (×3): 30 mg via ORAL
  Filled 2020-08-07 (×4): qty 1

## 2020-08-07 MED ORDER — ONDANSETRON HCL 4 MG PO TABS: 8.0000 mg | ORAL_TABLET | Freq: Three times a day (TID) | ORAL | Status: DC | PRN

## 2020-08-07 MED ORDER — HYDROCODONE-ACETAMINOPHEN 5-325 MG PO TABS
ORAL_TABLET | ORAL | Status: AC
  Administered 2020-08-07: 1 via ORAL
  Filled 2020-08-07: qty 1

## 2020-08-07 MED ORDER — ONDANSETRON HCL 4 MG PO TABS
4.0000 mg | ORAL_TABLET | ORAL | Status: DC | PRN
  Administered 2020-08-07 – 2020-08-15 (×3): 4 mg via ORAL
  Filled 2020-08-07 (×4): qty 1

## 2020-08-07 MED ORDER — LEVETIRACETAM 250 MG PO TABS
250.0000 mg | ORAL_TABLET | Freq: Two times a day (BID) | ORAL | Status: DC
  Administered 2020-08-07 – 2020-08-20 (×26): 250 mg via ORAL
  Filled 2020-08-07 (×26): qty 1

## 2020-08-07 MED ORDER — SEVELAMER CARBONATE 800 MG PO TABS: 1600.0000 mg | ORAL_TABLET | ORAL | Status: DC | PRN

## 2020-08-07 MED ORDER — ACETAMINOPHEN 325 MG PO TABS
650.0000 mg | ORAL_TABLET | ORAL | Status: DC | PRN
Start: 2020-08-07 — End: 2020-08-20
  Administered 2020-08-07 – 2020-08-18 (×11): 650 mg via ORAL
  Filled 2020-08-07 (×13): qty 2

## 2020-08-07 MED ORDER — SEVELAMER CARBONATE 800 MG PO TABS: 1600.0000 mg | ORAL_TABLET | Freq: Every day | ORAL | Status: DC | PRN

## 2020-08-07 MED ORDER — SEVELAMER CARBONATE 800 MG PO TABS: 2400.0000 mg | ORAL_TABLET | Freq: Three times a day (TID) | ORAL | Status: DC

## 2020-08-07 MED ORDER — ONDANSETRON HCL 4 MG/2ML IJ SOLN: 4.0000 mg | INTRAMUSCULAR | Status: DC | PRN

## 2020-08-07 MED ORDER — SEVELAMER CARBONATE 800 MG PO TABS
2400.0000 mg | ORAL_TABLET | Freq: Three times a day (TID) | ORAL | Status: DC
  Administered 2020-08-08 – 2020-08-15 (×20): 2400 mg via ORAL
  Filled 2020-08-07 (×21): qty 3

## 2020-08-07 MED ORDER — ACETAMINOPHEN 650 MG RE SUPP: 650.0000 mg | RECTAL | Status: DC | PRN

## 2020-08-07 MED ORDER — MIDODRINE HCL 5 MG PO TABS
20.0000 mg | ORAL_TABLET | ORAL | Status: DC
  Administered 2020-08-09 – 2020-08-20 (×5): 20 mg via ORAL
  Filled 2020-08-07 (×5): qty 4

## 2020-08-07 MED ORDER — HYDROCODONE-ACETAMINOPHEN 5-325 MG PO TABS
1.0000 | ORAL_TABLET | Freq: Four times a day (QID) | ORAL | Status: DC | PRN
  Administered 2020-08-08 – 2020-08-15 (×10): 1 via ORAL
  Filled 2020-08-07 (×12): qty 1

## 2020-08-07 MED ORDER — RENA-VITE PO TABS
1.0000 | ORAL_TABLET | Freq: Every day | ORAL | Status: DC
  Administered 2020-08-08 – 2020-08-19 (×12): 1 via ORAL
  Filled 2020-08-07 (×12): qty 1

## 2020-08-07 MED ORDER — SODIUM ZIRCONIUM CYCLOSILICATE 10 G PO PACK
10.0000 g | PACK | Freq: Two times a day (BID) | ORAL | Status: DC
  Administered 2020-08-07 – 2020-08-09 (×4): 10 g via ORAL
  Filled 2020-08-07 (×4): qty 1

## 2020-08-07 MED ORDER — PANTOPRAZOLE SODIUM 40 MG PO TBEC
40.0000 mg | DELAYED_RELEASE_TABLET | Freq: Every day | ORAL | Status: DC
  Administered 2020-08-08: 40 mg via ORAL
  Filled 2020-08-07: qty 1

## 2020-08-07 NOTE — Progress Notes (Signed)
Pt admitted to unit. P/P regarding safety reviewed with patient. COVID visitor policy reviewed. Medications reviewed with patient. Questions answered pt remains in bed 3 SR up, call light in reach.

## 2020-08-07 NOTE — Progress Notes (Signed)
Patient off floor for procedure 

## 2020-08-07 NOTE — Progress Notes (Signed)
Inpatient Rehab Admissions Coordinator:  There is a bed available in CIR for pt to admit today. Dr. Christella Noa aware and in agreement. NSG, TOC, son Eustace Moore, and pt aware.     Gayland Curry, [place], [place] Admissions Coordinator [phone]

## 2020-08-07 NOTE — Progress Notes (Signed)
Patient arrived to unit about 1820. Patient alert and oriented x4. Unit procedure and rehab process reviewed with patient. Patient reports headache pain 2 out of 10 and nausea. Zofran given at [place]. Call bell left within reach. Reported off to oncoming shift

## 2020-08-07 NOTE — Progress Notes (Signed)
Patient transferred to 4w-26

## 2020-08-07 NOTE — Plan of Care (Signed)
  Problem: Education: Goal: Knowledge of General Education information will improve Description: Including pain rating scale, medication(s)/side effects and non-pharmacologic comfort measures Outcome: Progressing   Problem: Health Behavior/Discharge Planning: Goal: Ability to manage health-related needs will improve Outcome: Progressing   Problem: Clinical Measurements: Goal: Ability to maintain clinical measurements within normal limits will improve Outcome: Progressing Goal: Will remain free from infection Outcome: Progressing Goal: Diagnostic test results will improve Outcome: Progressing Goal: Respiratory complications will improve Outcome: Progressing Goal: Cardiovascular complication will be avoided Outcome: Progressing   Problem: Nutrition: Goal: Adequate nutrition will be maintained Outcome: Progressing   Problem: Coping: Goal: Level of anxiety will decrease Outcome: Progressing   Problem: Elimination: Goal: Will not experience complications related to bowel motility Outcome: Progressing Goal: Will not experience complications related to urinary retention Outcome: Progressing   Problem: Skin Integrity: Goal: Risk for impaired skin integrity will decrease Outcome: Progressing   Problem: Safety: Goal: Ability to remain free from injury will improve Outcome: Progressing   Problem: Clinical Measurements: Goal: Usual level of consciousness will be regained or maintained. Outcome: Progressing Goal: Neurologic status will improve Outcome: Progressing Goal: Ability to maintain intracranial pressure will improve Outcome: Progressing   Problem: Skin Integrity: Goal: Demonstration of wound healing without infection will improve Outcome: Progressing

## 2020-08-07 NOTE — Plan of Care (Signed)
  Problem: Education: Goal: Knowledge of General Education information will improve Description: Including pain rating scale, medication(s)/side effects and non-pharmacologic comfort measures 08/07/2020 1759 by Delia Chimes, RN Outcome: Adequate for Discharge 08/07/2020 1444 by Delia Chimes, RN Outcome: Progressing   Problem: Health Behavior/Discharge Planning: Goal: Ability to manage health-related needs will improve 08/07/2020 1759 by Delia Chimes, RN Outcome: Adequate for Discharge 08/07/2020 1444 by Delia Chimes, RN Outcome: Progressing   Problem: Clinical Measurements: Goal: Ability to maintain clinical measurements within normal limits will improve 08/07/2020 1759 by Delia Chimes, RN Outcome: Adequate for Discharge 08/07/2020 1444 by Delia Chimes, RN Outcome: Progressing Goal: Will remain free from infection 08/07/2020 1759 by Delia Chimes, RN Outcome: Adequate for Discharge 08/07/2020 1444 by Delia Chimes, RN Outcome: Progressing Goal: Diagnostic test results will improve 08/07/2020 1759 by Delia Chimes, RN Outcome: Adequate for Discharge 08/07/2020 1444 by Delia Chimes, RN Outcome: Progressing Goal: Respiratory complications will improve 08/07/2020 1759 by Delia Chimes, RN Outcome: Adequate for Discharge 08/07/2020 1444 by Delia Chimes, RN Outcome: Progressing Goal: Cardiovascular complication will be avoided 08/07/2020 1759 by Delia Chimes, RN Outcome: Adequate for Discharge 08/07/2020 1444 by Delia Chimes, RN Outcome: Progressing   Problem: Activity: Goal: Risk for activity intolerance will decrease 08/07/2020 1759 by Delia Chimes, RN Outcome: Adequate for Discharge 08/07/2020 1444 by Delia Chimes, RN Outcome: Progressing   Problem: Nutrition: Goal: Adequate nutrition will be maintained 08/07/2020 1759 by Delia Chimes, RN Outcome: Adequate for Discharge 08/07/2020 1444 by Delia Chimes, RN Outcome:  Progressing   Problem: Coping: Goal: Level of anxiety will decrease 08/07/2020 1759 by Delia Chimes, RN Outcome: Adequate for Discharge 08/07/2020 1444 by Delia Chimes, RN Outcome: Progressing   Problem: Elimination: Goal: Will not experience complications related to bowel motility 08/07/2020 1759 by Delia Chimes, RN Outcome: Adequate for Discharge 08/07/2020 1444 by Delia Chimes, RN Outcome: Progressing Goal: Will not experience complications related to urinary retention 08/07/2020 1759 by Delia Chimes, RN Outcome: Adequate for Discharge 08/07/2020 1444 by Delia Chimes, RN Outcome: Progressing   Problem: Pain Managment: Goal: General experience of comfort will improve 08/07/2020 1759 by Delia Chimes, RN Outcome: Adequate for Discharge 08/07/2020 1444 by Delia Chimes, RN Outcome: Progressing   Problem: Safety: Goal: Ability to remain free from injury will improve 08/07/2020 1759 by Delia Chimes, RN Outcome: Adequate for Discharge 08/07/2020 1444 by Delia Chimes, RN Outcome: Progressing   Problem: Education: Goal: Knowledge of the prescribed therapeutic regimen will improve 08/07/2020 1759 by Delia Chimes, RN Outcome: Adequate for Discharge 08/07/2020 1444 by Delia Chimes, RN Outcome: Progressing   Problem: Clinical Measurements: Goal: Usual level of consciousness will be regained or maintained. 08/07/2020 1759 by Delia Chimes, RN Outcome: Adequate for Discharge 08/07/2020 1444 by Delia Chimes, RN Outcome: Progressing Goal: Neurologic status will improve 08/07/2020 1759 by Delia Chimes, RN Outcome: Adequate for Discharge 08/07/2020 1444 by Delia Chimes, RN Outcome: Progressing Goal: Ability to maintain intracranial pressure will improve 08/07/2020 1759 by Delia Chimes, RN Outcome: Adequate for Discharge 08/07/2020 1444 by Delia Chimes, RN Outcome: Progressing

## 2020-08-07 NOTE — H&P (Signed)
Physical Medicine and Rehabilitation Admission H&P        Chief Complaint  Patient presents with   Headache  : HPI: Karen [name] is a 65 year old right-handed female with history of diastolic congestive heart failure, colitis, atrial fibrillation, end-stage renal disease on hemodialysis Monday Wednesday Friday [facility] facility, hypertension, right breast cancer 2019.  Per chart review lives with spouse.  Two-level home half bath on main level.  2 steps to entry.  Independent prior to admission and driving.  Presented 08/02/2020 with a fall 3 weeks ago and noted progressive headaches.  She did see her PCP CT of the head 07/15/2020 negative.  Cranial CT scan 08/02/2020 showed a large left subdural hematoma noted resulting in a 4 mm left-to-right midline shift.  Small left tentorial subdural hematoma also noted as well as a smaller right subdural hematoma.  Underwent left parietal craniotomy for evacuation of subdural hematoma 08/02/2020 per Dr. Sherley Bounds.  Maintained on Keppra for seizure prophylaxis.  Decadron protocol as directed.  Hemodialysis ongoing as per renal services.  Monitoring of blood pressure with history of hypotension maintained on ProAmatine.  Tolerating a regular consistency diet.  Therapy evaluations completed due to patient decreased functional mobility was admitted for a comprehensive rehab program.   Review of Systems Constitutional:  Negative for chills and fever. HENT:  Negative for hearing loss.   Eyes:  Negative for blurred vision and double vision. Respiratory:  Negative for cough.        SOB with exertion  Cardiovascular:  Negative for chest pain, palpitations and leg swelling. Gastrointestinal:  Positive for constipation. Negative for heartburn, nausea and vomiting.       GERD  Genitourinary:  Negative for dysuria, flank pain and hematuria. Musculoskeletal:  Positive for joint pain and myalgias. Skin:  Negative for rash. Neurological:  Positive for  headaches.  All other systems reviewed and are negative.     Past Medical History:  Diagnosis Date   Anemia     Arthritis      knees   Breast cancer ([place]) 2019    Right Breast Cancer   Cancer [facility])     Chest pain Jan 2016    low risk Myoview   CHF (congestive heart failure) ([place])     Colitis 12/08/2014    [facility]- focal moderate active colitis   Complication of anesthesia 2005    difficulty remembering for a while and waking up   Constipation     Dysrhythmia      h/o A-Fib   Elevated LFTs 2016    [facility]   ESRD on dialysis [facility]) April 2016    MWF [facility]   Fundic gland polyps of stomach, benign     GERD (gastroesophageal reflux disease)     Heart murmur Nov 2015    Aortic scleosis- no stenosis   Hemodialysis patient [facility])     Hypertension     Shortness of breath dyspnea      with exertion         Past Surgical History:  Procedure Laterality Date   A/V FISTULAGRAM Right 04/22/2019    Procedure: A/V FISTULAGRAM - Right Upper;  Surgeon: Serafina Mitchell, MD;  Location: [place] CV LAB;  Service: Cardiovascular;  Laterality: Right;   A/V FISTULAGRAM N/A 02/17/2020    Procedure: A/V FISTULAGRAM - Right Upper;  Surgeon: Serafina Mitchell, MD;  Location: [place] CV LAB;  Service: Cardiovascular;  Laterality: N/A;   ABDOMINAL HYSTERECTOMY  2005   AV FISTULA PLACEMENT Left 12/09/2013    Procedure: INSERTION OF ARTERIOVENOUS (AV) GORE-TEX GRAFT ARM;  Surgeon: Elam Dutch, MD;  Location: [place];  Service: Vascular;  Laterality: Left;   AV FISTULA PLACEMENT Right 01/01/2018    Procedure: INSERTION OF ARTERIOVENOUS (AV) GORE-TEX GRAFT ARM RIGHT ARM;  Surgeon: Elam Dutch, MD;  Location: [employer];  Service: Vascular;  Laterality: Right;   AV FISTULA PLACEMENT Right 09/17/2018    Procedure: 1ST STAGE BASILIC ARTERIOVENOUS (AV) FISTULA CREATION RIGHT ARM;  Surgeon: Waynetta Sandy, MD;  Location: [place];  Service: Vascular;  Laterality: Right;    [place] Right 11/12/2018    Procedure: SECOND STAGE BASILIC VEIN TRANSPOSITION RIGHT ARM;  Surgeon: Waynetta Sandy, MD;  Location: [place];  Service: Vascular;  Laterality: Right;   BREAST BIOPSY   1990's   BUNIONECTOMY Bilateral     CHOLECYSTECTOMY   2005   COLONOSCOPY       CRANIOTOMY Left 08/02/2020    Procedure: CRANIOTOMY HEMATOMA EVACUATION SUBDURAL;  Surgeon: Eustace Moore, MD;  Location: [place];  Service: Neurosurgery;  Laterality: Left;   FISTULOGRAM Right 06/12/2019    Procedure: Fistulogram of right upper arm arteriovenous fistula;  Surgeon: Serafina Mitchell, MD;  Location: [facility] OR;  Service: Vascular;  Laterality: Right;   INSERTION OF ILIAC STENT   06/12/2019    Procedure: Insertion Of right basilic vein Stent;  Surgeon: Serafina Mitchell, MD;  Location: [place];  Service: Vascular;;   IR AV DIALY SHUNT INTRO NEEDLE/INTRACATH INITIAL W/PTA/IMG RIGHT Right 03/04/2019   IR GENERIC HISTORICAL   01/11/2016    IR US GUIDE VASC ACCESS RIGHT 01/11/2016 Corrie Mckusick, DO [facility]-INTERV [facility]   IR GENERIC HISTORICAL   01/11/2016    IR RADIOLOGY PERIPHERAL GUIDED IV START 01/11/2016 Corrie Mckusick, DO [facility]INTER[facility]   IR IMAGING GUIDED PORT INSERTION   07/05/2017   IR REMOVAL TUN CV CATH W/O FL   08/12/2019   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   KNEE ARTHROSCOPY Bilateral     MASTECTOMY W/ SENTINEL NODE BIOPSY Right 11/20/2017   MASTECTOMY W/ SENTINEL NODE BIOPSY Right 11/20/2017    Procedure: RIGHT MASTECTOMY WITH SENTINEL LYMPH NODE BIOPSY;  Surgeon: Coralie Keens, MD;  Location: [place];  Service: General;  Laterality: Right;   PERIPHERAL VASCULAR BALLOON ANGIOPLASTY   02/17/2020    Procedure: PERIPHERAL VASCULAR BALLOON ANGIOPLASTY;  Surgeon: Serafina Mitchell, MD;  Location: [place] CV LAB;  Service: Cardiovascular;;   PERIPHERAL VASCULAR CATHETERIZATION N/A 08/31/2015    Procedure: A/V Shuntogram;  Surgeon: Serafina Mitchell, MD;  Location: [place] CV LAB;   Service: Cardiovascular;  Laterality: N/A;   PERIPHERAL VASCULAR CATHETERIZATION Left 08/31/2015    Procedure: Peripheral Vascular Balloon Angioplasty;  Surgeon: Serafina Mitchell, MD;  Location: [place] CV LAB;  Service: Cardiovascular;  Laterality: Left;  arm fistula   PERIPHERAL VASCULAR INTERVENTION Right 04/22/2019    Procedure: PERIPHERAL VASCULAR INTERVENTION;  Surgeon: Serafina Mitchell, MD;  Location: [place] CV LAB;  Service: Cardiovascular;  Laterality: Right;  FISTULA   PORT-A-CATH REMOVAL N/A 08/12/2018    Procedure: PORT-A-CATH REMOVAL;  Surgeon: Coralie Keens, MD;  Location: [place];  Service: General;  Laterality: N/A;   [place] Right 12/24/2017    Procedure: UPPER EXTREMITY VENOGRAPHY CENTRAL VENOGRAM;  Surgeon: Waynetta Sandy, MD;  Location: [place] CV  LAB;  Service: Cardiovascular;  Laterality: Right;   UPPER EXTREMITY VENOGRAPHY Bilateral 09/02/2018    Procedure: UPPER EXTREMITY VENOGRAPHY;  Surgeon: Waynetta Sandy, MD;  Location: [place] CV LAB;  Service: Cardiovascular;  Laterality: Bilateral;         Family History  Problem Relation Age of Onset   Kidney disease Mother     Heart attack Father     Kidney disease Father     Diabetes Sister     Hyperlipidemia Sister     Hypertension Sister     Kidney disease Sister          x2    Social History:  reports that she has never smoked. She has never used smokeless tobacco. She reports that she does not drink alcohol and does not use drugs. Allergies: No Known Allergies          Facility-Administered Medications Prior to Admission  Medication Dose Route Frequency Provider Last Rate Last Admin   0.9 %  sodium chloride infusion  250 mL Intravenous PRN Serafina Mitchell, MD       sodium chloride flush (NS) 0.9 % injection 3 mL  3 mL Intravenous Q12H Serafina Mitchell, MD       sodium chloride flush (NS) 0.9 % injection 3 mL  3 mL Intravenous  PRN Serafina Mitchell, MD              Medications Prior to Admission  Medication Sig Dispense Refill   acetaminophen (TYLENOL) 500 MG tablet Take 1,000 mg by mouth daily as needed for mild pain, moderate pain or headache.       amoxicillin (AMOXIL) 500 MG capsule Take 2,000 mg by mouth See admin instructions. Take 4 capsules (2000 mg) by mouth one hour prior to dental procedures       clobetasol cream (TEMOVATE) 6.19 % Apply 1 application topically daily as needed (for rash).       diltiazem (CARDIZEM) 30 MG tablet Take 1 tablet (30 mg total) by mouth every Monday, Wednesday, and Friday. 135 tablet 1   ethyl chloride spray Apply 1 application topically every Monday, Wednesday, and Friday with hemodialysis.       fluticasone (FLONASE) 50 MCG/ACT nasal spray Place 1 spray into both nostrils daily as needed for allergies.       lidocaine-prilocaine (EMLA) cream Apply 1 application topically See admin instructions. Apply topically Monday, Wednesday and Friday before dialysis   6   midodrine (PROAMATINE) 10 MG tablet Take 20 mg by mouth every Monday, Wednesday, and Friday. Prior to dialysis       multivitamin (RENA-VIT) TABS tablet Take 1 tablet by mouth daily.       ondansetron (ZOFRAN) 8 MG tablet Take 8 mg by mouth every 8 (eight) hours as needed for nausea or vomiting.       pantoprazole (PROTONIX) 40 MG tablet TAKE 1 TABLET (40 MG TOTAL) BY MOUTH DAILY. (Patient taking differently: Take 40 mg by mouth daily.) 90 tablet 3   prochlorperazine (COMPAZINE) 10 MG tablet Take 10 mg by mouth every 6 (six) hours as needed (Nausea or vomiting).       sevelamer carbonate (RENVELA) 800 MG tablet Take 1,600-2,400 mg by mouth See admin instructions. Take 3 tablets (2400 mg) by mouth with each meal & 2 tablets (1600 mg) by mouth with each snack.       HYDROcodone-acetaminophen (NORCO) 5-325 MG tablet Take 1 tablet by mouth every 6 (six) hours as needed  for moderate pain. (Patient not taking: Reported on  08/03/2020) 20 tablet 0      Drug Regimen Review Drug regimen was reviewed and remains appropriate with no significant issues identified   Home: Home Living Family/patient expects to be discharged to:: Private residence Living Arrangements: Spouse/significant other Available Help at Discharge: Family, Available PRN/intermittently Type of Home: House Home Access: Stairs to enter Technical brewer of Steps: 2 Entrance Stairs-Rails: None Home Layout: Two level, 1/2 bath on main level (bedrooms are upstairs currently have made sunroom a bedroom for spouse, pt states i can sleep in my spouse lift chair) Alternate Level Stairs-Number of Steps: 14 Alternate Level Stairs-Rails: Left Bathroom Shower/Tub: Multimedia programmer: Standard Bathroom Accessibility: Yes Home Equipment:  (lift chair for spouse, spouse does not walk so uses wheelchair) Additional Comments: electrical lift to transfer spouse, son is helping with spouse at this time. no caregivers other than patient for spouse  Lives With: Spouse   Functional History: Prior Function Level of Independence: Independent Comments: drives to HD MWF   Functional Status:  Mobility: Bed Mobility Overal bed mobility: Needs Assistance Bed Mobility: Supine to Sit Supine to sit: Supervision General bed mobility comments: No physical assist required Transfers Overall transfer level: Needs assistance Equipment used: Rolling walker (2 wheeled) Transfers: Sit to/from Stand Sit to Stand: Min guard General transfer comment: Min guard to rise to stand from edge of bed and recliner, increased time to power up Ambulation/Gait Ambulation/Gait assistance: Min guard, Min assist Gait Distance (Feet): 120 Feet Assistive device: Rolling walker (2 wheeled), None Gait Pattern/deviations: Step-through pattern, Decreased stride length, Narrow base of support General Gait Details: Cues for wider BOS, upright posture, increased gait speed.  Pt requiring min guard assist with walker, minA with no AD Gait velocity: decreased   ADL: ADL Overall ADL's : Needs assistance/impaired Eating/Feeding: Set up, Bed level Grooming: Oral care, Min guard, Standing Upper Body Bathing: Min guard, Standing Lower Body Bathing: Min guard, Sit to/from stand Upper Body Dressing : Min guard, Standing Toilet Transfer: Min guard (requires bil UE) Functional mobility during ADLs: Min guard General ADL Comments: pt completed sink level bathing this session with UE support for balance. pt able to alternate attention and sequence task   Cognition: Cognition Overall Cognitive Status: Within Functional Limits for tasks assessed Orientation Level: Oriented X4 Cognition Arousal/Alertness: Awake/alert Behavior During Therapy: WFL for tasks assessed/performed Overall Cognitive Status: Within Functional Limits for tasks assessed   Physical Exam: Blood pressure 134/73, pulse 71, temperature (!) 97.4 F (36.3 C), temperature source Oral, resp. rate 14, height 5\' 1"  (1.549 m), weight 91.3 kg, SpO2 100 %. Physical Exam HENT:    Head:    Comments: Craniotomy site clean and dry.Left parietal with staples Neurological:    Comments: Patient is alert but appears tired.  No acute distress.  Makes eye contact with examiner.  Follows simple commands.  Oriented x 3     General: No acute distress Mood and affect are appropriate Heart: Regular rate and rhythm no rubs murmurs or extra sounds Lungs: Clear to auscultation, breathing unlabored, no rales or wheezes Abdomen: Positive bowel sounds, soft nontender to palpation, nondistended Extremities: No clubbing, cyanosis, or edema,Left AV fistula Skin: No evidence of breakdown, no evidence of rash Neurologic: Cranial nerves II through XII intact, motor strength is 5/5 inLeft and 4/5 RIght deltoid, bicep, tricep, grip, hip flexor, knee extensors, ankle dorsiflexor and plantar flexor Sensory exam normal sensation to  light touch and proprioception in bilateral  upper and lower extremities  Musculoskeletal: Full range of motion in all 4 extremities. No joint swelling  Lab Results Last 48 Hours        Results for orders placed or performed during the hospital encounter of 08/02/20 (from the past 48 hour(s))  Hepatitis B surface antigen     Status: None    Collection Time: 08/03/20  8:26 PM  Result Value Ref Range    Hepatitis B Surface Ag NON REACTIVE NON REACTIVE      Comment: Performed at [facility], [address] [place], [place] [address]  Basic metabolic panel     Status: Abnormal    Collection Time: 08/04/20 10:46 AM  Result Value Ref Range    Sodium 136 135 - 145 mmol/L    Potassium 5.4 (H) 3.5 - 5.1 mmol/L    Chloride 96 (L) 98 - 111 mmol/L    CO2 26 22 - 32 mmol/L    Glucose, Bld 147 (H) 70 - 99 mg/dL      Comment: Glucose reference range applies only to samples taken after fasting for at least 8 hours.    BUN 35 (H) 8 - 23 mg/dL    Creatinine, Ser 6.83 (H) 0.44 - 1.00 mg/dL    Calcium 8.9 8.9 - 10.3 mg/dL    GFR, Estimated 6 (L) >60 mL/min      Comment: (NOTE) Calculated using the CKD-EPI Creatinine Equation (2021)      Anion gap 14 5 - 15      Comment: Performed at [facility] [address] [place], [place] [address]      Imaging Results (Last 48 hours)  No results found.           Medical Problem List and Plan: 1.  Decreased functional mobility with gait disturbance secondary to traumatic left SDH with 4 mm left to right midline shift as well as small right subdural hematoma status post left parietal craniotomy evacuation of subdural hematoma 08/02/2020.  Taper from Decadron             -patient may not shower             -ELOS/Goals: 7-10d 2.  Antithrombotics: -DVT/anticoagulation: SCDs             -antiplatelet therapy: N/A 3. Pain Management: Hydrocodone as needed 4. Mood: Vita motional support             -antipsychotic agents: N/A 5. Neuropsych:  This patient is capable of making decisions on her own behalf. 6. Skin/Wound Care: Routine skin checks 7. Fluids/Electrolytes/Nutrition: Routine in and outs with follow-up chemistries 8.  Seizure prophylaxis.  Keppra 250 mg twice daily 9.  Atrial fibrillation.  Cardizem 30 mg Monday Wednesday Friday 10.  Hypotension.  ProAmatine 20 mg Monday Wednesday Friday 11.  GERD.  Protonix 12.  End-stage renal disease/hyperkalemia.  Hemodialysis per renal service.  Continue Lokelma as directed 13.  Diastolic congestive heart failure.  Monitor for any signs of fluid overload     Cathlyn Parsons, PA-C 08/05/2020  "I have personally performed a face to face diagnostic evaluation of this patient.  Additionally, I have reviewed and concur with the physician assistant's documentation above." Charlett Blake M.D. [employer] Fellow Am Acad of Phys Med and Rehab Diplomate Am Board of Electrodiagnostic Med Fellow Am Board of Interventional Pain

## 2020-08-07 NOTE — Progress Notes (Signed)
PT Cancellation Note  Patient Details Name: Karen [name] MRN: [id number] DOB: Feb 15, 1955   Cancelled Treatment:    Reason Eval/Treat Not Completed: Pain limiting ability to participate. Coordinated with RN to have pt receive pain meds prior to PT, but pt still reporting severe headache and feeling cold. Pt requesting to hold off on PT treatment this date due to feeling unwell, respected pt's desires. Will plan to follow-up another day.   Moishe Spice, PT, DPT Acute Rehabilitation Services  Pager: [phone] Office: [place] 08/07/2020, 1:16 PM

## 2020-08-07 NOTE — Progress Notes (Signed)
Patient back in room from procedure. °

## 2020-08-07 NOTE — Progress Notes (Signed)
Patient ID: Karen [name], female   DOB: 12-10-1955, 65 y.o.   MRN: [id number]  S: Seen in room.  Continued intermittent HAs To transfer to CIR today. HD today off schedule.  O:BP 133/78 (BP Location: Left Arm)   Pulse 81   Temp 98.1 F (36.7 C) (Oral)   Resp 18   Ht 5\' 1"  (1.549 m)   Wt 91.3 kg   LMP  (LMP Unknown)   SpO2 98%   BMI 38.03 kg/m   Intake/Output Summary (Last 24 hours) at 08/07/2020 0931 Last data filed at 08/06/2020 1200 Gross per 24 hour  Intake 25 ml  Output --  Net 25 ml    Intake/Output: I/O last 3 completed shifts: In: 265 [P.O.:240; Other:25] Out: -   Intake/Output this shift:  No intake/output data recorded. Weight change:  Gen: NAD, fatigued CVS:RRR Resp: CTA Abd: +Bs, soft, NT/ND Ext: no edema, RUE AVF +T/B  Recent Labs  Lab 08/02/20 0942 08/03/20 0245 08/04/20 1046  NA 136 136 136  K 4.7 6.2* 5.4*  CL 97* 98 96*  CO2 28 24 26   GLUCOSE 101* 116* 147*  BUN 37* 45* 35*  CREATININE 7.58* 9.04* 6.83*  CALCIUM 8.9 9.2 8.9    Liver Function Tests: No results for input(s): AST, ALT, ALKPHOS, BILITOT, PROT, ALBUMIN in the last 168 hours. No results for input(s): LIPASE, AMYLASE in the last 168 hours. No results for input(s): AMMONIA in the last 168 hours. CBC: Recent Labs  Lab 08/02/20 0942 08/03/20 0245  WBC 3.9* 7.2  HGB 10.4* 11.0*  HCT 31.9* 33.1*  MCV 100.3* 99.1  PLT 196 199    Cardiac Enzymes: No results for input(s): CKTOTAL, CKMB, CKMBINDEX, TROPONINI in the last 168 hours. CBG: No results for input(s): GLUCAP in the last 168 hours.  Iron Studies: No results for input(s): IRON, TIBC, TRANSFERRIN, FERRITIN in the last 72 hours. Studies/Results: CT HEAD WO CONTRAST  Result Date: 08/06/2020 CLINICAL DATA:  Follow-up subdural hematoma. EXAM: CT HEAD WITHOUT CONTRAST TECHNIQUE: Contiguous axial images were obtained from the base of the skull through the vertex without intravenous contrast. COMPARISON:  08/03/2020  FINDINGS: Brain: Sequelae of left-sided craniotomy for subdural hematoma evacuation are again identified. The subdural drain has been removed. Pneumocephalus has decreased. A small amount of residual subdural hematoma over the left cerebral convexity and along the left tentorium and posterior falx has slightly decreased. A low-density subdural collection over the right cerebral convexity is unchanged, measuring up to 6 mm in thickness. There is no significant midline shift. No new intracranial hemorrhage, acute infarct, or mass is identified. The ventricles are unchanged in size. Vascular: Calcified atherosclerosis at the skull base. Skull: Left frontal craniotomy with overlying mild scalp soft tissue swelling and small volume gas. Sinuses/Orbits: Visualized paranasal sinuses and mastoid air cells are clear. Visualized orbits are unremarkable. Other: None. IMPRESSION: 1. Sequelae of left subdural hematoma evacuation with interval drain removal, decreased pneumocephalus, and slightly decreased small volume residual subdural hematoma. No residual midline shift. 2. Unchanged small right-sided subdural hematoma. Electronically Signed   By: Logan Bores M.D.   On: 08/06/2020 18:25    Chlorhexidine Gluconate Cloth  6 each Topical Daily   Chlorhexidine Gluconate Cloth  6 each Topical Q0600   diltiazem  30 mg Oral Q M,W,F   levETIRAcetam  250 mg Oral BID   lidocaine-prilocaine  1 application Topical See admin instructions   midodrine  20 mg Oral Q M,W,F   multivitamin  1 tablet  Oral Daily   pantoprazole  40 mg Oral Daily   senna  1 tablet Oral BID   sevelamer carbonate  2,400 mg Oral TID WC   sodium zirconium cyclosilicate  10 g Oral BID    BMET    Component Value Date/Time   NA 136 08/04/2020 1046   K 5.4 (H) 08/04/2020 1046   CL 96 (L) 08/04/2020 1046   CO2 26 08/04/2020 1046   GLUCOSE 147 (H) 08/04/2020 1046   BUN 35 (H) 08/04/2020 1046   CREATININE 6.83 (H) 08/04/2020 1046   CREATININE 7.25 (HH)  08/06/2018 0751   CREATININE 5.71 (H) 02/27/2014 0900   CALCIUM 8.9 08/04/2020 1046   CALCIUM 9.0 01/28/2014 1454   GFRNONAA 6 (L) 08/04/2020 1046   GFRNONAA 5 (L) 08/06/2018 0751   GFRAA 5 (L) 04/15/2019 1709   GFRAA 6 (L) 08/06/2018 0751   CBC    Component Value Date/Time   WBC 7.2 08/03/2020 0245   RBC 3.34 (L) 08/03/2020 0245   HGB 11.0 (L) 08/03/2020 0245   HGB 9.6 (L) 08/06/2018 0751   HCT 33.1 (L) 08/03/2020 0245   PLT 199 08/03/2020 0245   PLT 176 08/06/2018 0751   MCV 99.1 08/03/2020 0245   MCH 32.9 08/03/2020 0245   MCHC 33.2 08/03/2020 0245   RDW 13.3 08/03/2020 0245   LYMPHSABS 0.7 08/06/2018 0751   MONOABS 0.5 08/06/2018 0751   EOSABS 0.3 08/06/2018 0751   BASOSABS 0.1 08/06/2018 0751      Dialysis Orders: Center: [facility]  on MWF . EDW 86kg HD Bath 2K/2Ca  Time 4 hrs Heparin 4000 units bolus. Access RUE AVF  BFR 350 DFR 500    Hectoral 5 mcg IV/HD,  Cinacalcet 30 mg with HD TIW   Assessment/Plan:  Subdural hematoma L>R - s/p left parietal craniotomy and evacuation of hematoma.  S/p drain removal and staples placed.  Did note right SDH but was small.  Neurosurgery managing. For CIR placement   ESRD - off schedule due to high HD census, will plan on HD again Saturday and get back on schedule by Monday.   Hypertension/volume  - stable  Anemia  - Hgb 11, no micera  Metabolic bone disease -  continue with home meds  Nutrition - renal diet Disposition - possible CIR placement, awaiting insurance clearance.  Lynnda Child PA-C [facility] 08/07/2020,9:31 AM

## 2020-08-08 DIAGNOSIS — S065X0D Traumatic subdural hemorrhage without loss of consciousness, subsequent encounter: Secondary | ICD-10-CM | POA: Diagnosis not present

## 2020-08-08 MED ORDER — POLYETHYLENE GLYCOL 3350 17 G PO PACK
17.0000 g | PACK | Freq: Every day | ORAL | Status: DC
  Administered 2020-08-08 – 2020-08-20 (×13): 17 g via ORAL
  Filled 2020-08-08 (×13): qty 1

## 2020-08-08 MED ORDER — BISACODYL 10 MG RE SUPP: 10.0000 mg | Freq: Every day | RECTAL | Status: DC | PRN

## 2020-08-08 MED ORDER — SIMVASTATIN 20 MG PO TABS
20.0000 mg | ORAL_TABLET | Freq: Every day | ORAL | Status: DC
  Administered 2020-08-08: 20 mg via ORAL
  Filled 2020-08-08: qty 1

## 2020-08-08 MED ORDER — TOPIRAMATE 25 MG PO TABS
25.0000 mg | ORAL_TABLET | Freq: Two times a day (BID) | ORAL | Status: DC
  Administered 2020-08-08 – 2020-08-16 (×17): 25 mg via ORAL
  Filled 2020-08-08 (×17): qty 1

## 2020-08-08 NOTE — Progress Notes (Signed)
Pt states H/A did improve for a short time and then returns. Pt c/o nausea secondary to H/A. Pt also stated that she is having "pain" in the middle of her sternum. "It feels like something is stuck and can't go down." Pt's skin is warm and dry no s/sx of respiratory distress. Denies any pain in back, jaw, or arms.  There is no JVD  pt is slightly tachycardic pt does have a hx of Afib. Pt's lungs are clear to auscultation and remain diminished in the bases. Pt denies any feeling of doom or heaviness in her chest. Pt medicated for nausea and pain. Will continue to monitor for relief.

## 2020-08-08 NOTE — Discharge Instructions (Addendum)
Inpatient Rehab Discharge Instructions  Karen [name] Discharge date and time: 08/20/20   Activities/Precautions/ Functional Status: Activity: As tolerated Diet: Renal diet-limit fluids to 1200cc/day--5 cups Wound Care: Routine skin checks   Functional status:  ___ No restrictions     ___ Walk up steps independently ___ 24/7 supervision/assistance   ___ Walk up steps with assistance _X__ Intermittent supervision/assistance  _X__ Bathe/dress independently ___ Walk with walker     ___ Bathe/dress with assistance ___ Walk Independently    ___ Shower independently ___ Walk with assistance    _X__ Shower with supervision  _X__ No alcohol     ___ Return to work/school ________   COMMUNITY REFERRALS UPON DISCHARGE:    Home Health:   PT     OT     ST    SNA    SW                   Agency: Phone:     Medical Equipment/Items Ordered:                                                 Agency/Supplier:    Special Instructions: No driving smoking or alcohol  Continue hemodialysis as directed   My questions have been answered and I understand these instructions. I will adhere to these goals and the provided educational materials after my discharge from the hospital.  Patient/Caregiver Signature _______________________________ Date __________  Clinician Signature _______________________________________ Date __________  Please bring this form and your medication list with you to all your follow-up doctor's appointments.

## 2020-08-08 NOTE — Progress Notes (Addendum)
Patient ID: Karen [name], female   DOB: [birth year], 65 y.o.   MRN: [id number]  S: Seen in room. Had dialysis yesterday. Net UF 2L. Felt bad with HA during treatment.   O:BP (!) 152/83 (BP Location: Left Wrist)   Pulse (!) 102   Temp 97.8 F (36.6 C) (Oral)   Resp 20   Ht 5\' 1"  (1.549 m)   Wt 93.7 kg   LMP  (LMP Unknown)   SpO2 100%   BMI 39.03 kg/m   Intake/Output Summary (Last 24 hours) at 08/08/2020 1127 Last data filed at 08/08/2020 0844 Gross per 24 hour  Intake 540 ml  Output --  Net 540 ml    Intake/Output: I/O last 3 completed shifts: In: 120 [P.O.:120] Out: -   Intake/Output this shift:  Total I/O In: 420 [P.O.:420] Out: -  Weight change:  Gen: NAD, fatigued CVS:RRR Resp: CTA Abd: +Bs, soft, NT/ND Ext: no edema, RUE AVF +T/B  Recent Labs  Lab 08/02/20 0942 08/03/20 0245 08/04/20 1046  NA 136 136 136  K 4.7 6.2* 5.4*  CL 97* 98 96*  CO2 28 24 26   GLUCOSE 101* 116* 147*  BUN 37* 45* 35*  CREATININE 7.58* 9.04* 6.83*  CALCIUM 8.9 9.2 8.9    Liver Function Tests: No results for input(s): AST, ALT, ALKPHOS, BILITOT, PROT, ALBUMIN in the last 168 hours. No results for input(s): LIPASE, AMYLASE in the last 168 hours. No results for input(s): AMMONIA in the last 168 hours. CBC: Recent Labs  Lab 08/02/20 0942 08/03/20 0245  WBC 3.9* 7.2  HGB 10.4* 11.0*  HCT 31.9* 33.1*  MCV 100.3* 99.1  PLT 196 199    Cardiac Enzymes: No results for input(s): CKTOTAL, CKMB, CKMBINDEX, TROPONINI in the last 168 hours. CBG: No results for input(s): GLUCAP in the last 168 hours.  Iron Studies: No results for input(s): IRON, TIBC, TRANSFERRIN, FERRITIN in the last 72 hours. Studies/Results: CT HEAD WO CONTRAST  Result Date: 08/06/2020 CLINICAL DATA:  Follow-up subdural hematoma. EXAM: CT HEAD WITHOUT CONTRAST TECHNIQUE: Contiguous axial images were obtained from the base of the skull through the vertex without intravenous contrast. COMPARISON:   08/03/2020 FINDINGS: Brain: Sequelae of left-sided craniotomy for subdural hematoma evacuation are again identified. The subdural drain has been removed. Pneumocephalus has decreased. A small amount of residual subdural hematoma over the left cerebral convexity and along the left tentorium and posterior falx has slightly decreased. A low-density subdural collection over the right cerebral convexity is unchanged, measuring up to 6 mm in thickness. There is no significant midline shift. No new intracranial hemorrhage, acute infarct, or mass is identified. The ventricles are unchanged in size. Vascular: Calcified atherosclerosis at the skull base. Skull: Left frontal craniotomy with overlying mild scalp soft tissue swelling and small volume gas. Sinuses/Orbits: Visualized paranasal sinuses and mastoid air cells are clear. Visualized orbits are unremarkable. Other: None. IMPRESSION: 1. Sequelae of left subdural hematoma evacuation with interval drain removal, decreased pneumocephalus, and slightly decreased small volume residual subdural hematoma. No residual midline shift. 2. Unchanged small right-sided subdural hematoma. Electronically Signed   By: Logan Bores M.D.   On: 08/06/2020 18:25    [START ON 08/09/2020] diltiazem  30 mg Oral Q M,W,F   levETIRAcetam  250 mg Oral BID   [START ON 08/09/2020] midodrine  20 mg Oral Q M,W,F-HD   multivitamin  1 tablet Oral QHS   pantoprazole  40 mg Oral Daily   polyethylene glycol  17 g Oral Daily  senna  1 tablet Oral BID   sevelamer carbonate  2,400 mg Oral TID WC   sodium zirconium cyclosilicate  10 g Oral BID   topiramate  25 mg Oral BID    BMET    Component Value Date/Time   NA 136 08/04/2020 1046   K 5.4 (H) 08/04/2020 1046   CL 96 (L) 08/04/2020 1046   CO2 26 08/04/2020 1046   GLUCOSE 147 (H) 08/04/2020 1046   BUN 35 (H) 08/04/2020 1046   CREATININE 6.83 (H) 08/04/2020 1046   CREATININE 7.25 (HH) 08/06/2018 0751   CREATININE 5.71 (H) 02/27/2014 0900    CALCIUM 8.9 08/04/2020 1046   CALCIUM 9.0 01/28/2014 1454   GFRNONAA 6 (L) 08/04/2020 1046   GFRNONAA 5 (L) 08/06/2018 0751   GFRAA 5 (L) 04/15/2019 1709   GFRAA 6 (L) 08/06/2018 0751   CBC    Component Value Date/Time   WBC 7.2 08/03/2020 0245   RBC 3.34 (L) 08/03/2020 0245   HGB 11.0 (L) 08/03/2020 0245   HGB 9.6 (L) 08/06/2018 0751   HCT 33.1 (L) 08/03/2020 0245   PLT 199 08/03/2020 0245   PLT 176 08/06/2018 0751   MCV 99.1 08/03/2020 0245   MCH 32.9 08/03/2020 0245   MCHC 33.2 08/03/2020 0245   RDW 13.3 08/03/2020 0245   LYMPHSABS 0.7 08/06/2018 0751   MONOABS 0.5 08/06/2018 0751   EOSABS 0.3 08/06/2018 0751   BASOSABS 0.1 08/06/2018 0751      Dialysis Orders: Center: [facility]  on MWF . EDW 86kg HD Bath 2K/2Ca  Time 4 hrs Heparin 4000 units bolus. 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  S/p drain removal and staples placed.  Did note right SDH but was small. Now in CIR.   ESRD - MWF. Back on schedule. Next HD  7/25.  Hypertension/volume  - stable BP. Not to EDW by weights here. Get standing weight next HD.   Anemia  - Hgb 11, no micera  Metabolic bone disease -  continue with home meds  Nutrition - renal diet   Lynnda Child PA-C [facility] 08/08/2020,11:27 AM  Pt seen, examined and agree w A/P as above.  Kelly Splinter  MD 08/08/2020, 5:28 PM

## 2020-08-08 NOTE — Progress Notes (Signed)
PROGRESS NOTE   Subjective/Complaints:  Has reflux when she eats [facility] has been minimally elevated during meals    ROS- Neg SOB, CP at meals only, no N/V  Objective:   CT HEAD WO CONTRAST  Result Date: 08/06/2020 CLINICAL DATA:  Follow-up subdural hematoma. EXAM: CT HEAD WITHOUT CONTRAST TECHNIQUE: Contiguous axial images were obtained from the base of the skull through the vertex without intravenous contrast. COMPARISON:  08/03/2020 FINDINGS: Brain: Sequelae of left-sided craniotomy for subdural hematoma evacuation are again identified. The subdural drain has been removed. Pneumocephalus has decreased. A small amount of residual subdural hematoma over the left cerebral convexity and along the left tentorium and posterior falx has slightly decreased. A low-density subdural collection over the right cerebral convexity is unchanged, measuring up to 6 mm in thickness. There is no significant midline shift. No new intracranial hemorrhage, acute infarct, or mass is identified. The ventricles are unchanged in size. Vascular: Calcified atherosclerosis at the skull base. Skull: Left frontal craniotomy with overlying mild scalp soft tissue swelling and small volume gas. Sinuses/Orbits: Visualized paranasal sinuses and mastoid air cells are clear. Visualized orbits are unremarkable. Other: None. IMPRESSION: 1. Sequelae of left subdural hematoma evacuation with interval drain removal, decreased pneumocephalus, and slightly decreased small volume residual subdural hematoma. No residual midline shift. 2. Unchanged small right-sided subdural hematoma. Electronically Signed   By: Logan Bores M.D.   On: 08/06/2020 18:25   No results for input(s): WBC, HGB, HCT, PLT in the last 72 hours. No results for input(s): NA, K, CL, CO2, GLUCOSE, BUN, CREATININE, CALCIUM in the last 72 hours.  Intake/Output Summary (Last 24 hours) at 08/08/2020 0821 Last data filed at  08/07/2020 2100 Gross per 24 hour  Intake 120 ml  Output --  Net 120 ml        Physical Exam: Vital Signs Blood pressure (!) 152/83, pulse (!) 102, temperature 97.8 F (36.6 C), temperature source Oral, resp. rate 20, height 5\' 1"  (1.549 m), weight 93.7 kg, SpO2 100 %.   General: No acute distress Mood and affect are appropriate Heart: Regular rate and rhythm no rubs murmurs or extra sounds Lungs: Clear to auscultation, breathing unlabored, no rales or wheezes Abdomen: Positive bowel sounds, soft nontender to palpation, nondistended Extremities: No clubbing, cyanosis, or edema Skin: No evidence of breakdown, no evidence of rash Neurologic: motor strength is 5/5 in left 4+ right deltoid, bicep, tricep, grip, hip flexor, knee extensors, ankle dorsiflexor and plantar flexor Sensory exam normal sensation to light touch and proprioception in bilateral upper and lower extremities Cerebellar exam normal finger to nose to finger as well as heel to shin in bilateral upper and lower extremities Musculoskeletal: Full range of motion in all 4 extremities. No joint swelling   Assessment/Plan: 1. Functional deficits which require 3+ hours per day of interdisciplinary therapy in a comprehensive inpatient rehab setting. Physiatrist is providing close team supervision and 24 hour management of active medical problems listed below. Physiatrist and rehab team continue to assess barriers to discharge/monitor patient progress toward functional and medical goals  Care Tool:  Bathing              Bathing assist  Upper Body Dressing/Undressing Upper body dressing        Upper body assist      Lower Body Dressing/Undressing Lower body dressing            Lower body assist       Toileting Toileting    Toileting assist       Transfers Chair/bed transfer  Transfers assist           Locomotion Ambulation   Ambulation assist              Walk 10 feet  activity   Assist           Walk 50 feet activity   Assist           Walk 150 feet activity   Assist           Walk 10 feet on uneven surface  activity   Assist           Wheelchair     Assist               Wheelchair 50 feet with 2 turns activity    Assist            Wheelchair 150 feet activity     Assist          Blood pressure (!) 152/83, pulse (!) 102, temperature 97.8 F (36.6 C), temperature source Oral, resp. rate 20, height 5\' 1"  (1.549 m), weight 93.7 kg, SpO2 100 %.  Medical Problem List and Plan: 1.  Decreased functional mobility with gait disturbance secondary to traumatic left SDH with 4 mm left to right midline shift as well as small right subdural hematoma status post left parietal craniotomy evacuation of subdural hematoma 08/02/2020.  Tapered off from Decadron, discussed with neurosurgery yesterday              -patient may not shower             -ELOS/Goals: 7-10d 2.  Antithrombotics: -DVT/anticoagulation: SCDs             -antiplatelet therapy: N/A 3. Pain Management: Hydrocodone as needed 4. Mood: Vita motional support             -antipsychotic agents: N/A 5. Neuropsych: This patient is capable of making decisions on her own behalf. 6. Skin/Wound Care: Routine skin checks 7. Fluids/Electrolytes/Nutrition: Routine in and outs with follow-up chemistries 8.  Seizure prophylaxis.  Keppra 250 mg twice daily 9.  Atrial fibrillation.  Cardizem 30 mg Monday Wednesday Friday 10.  Hypotension.  ProAmatine 20 mg Monday Wednesday Friday 11.  GERD.  Protonix- needs HOB up 30 deg at all times and upright during meals and 1 hour after  12.  End-stage renal disease/hyperkalemia.  Hemodialysis per renal service.  Continue Lokelma as directed 13.  Diastolic congestive heart failure.  Monitor for any signs of fluid overload  14.  HA post SDH and crani, start topiramate 25mg  BID  LOS: 1 days A FACE TO FACE EVALUATION  WAS PERFORMED  Charlett Blake 08/08/2020, 8:21 AM

## 2020-08-08 NOTE — Evaluation (Signed)
Speech Language Pathology Assessment and Plan  Patient Details  Name: Karen [name] MRN: [id number] Date of Birth: [birth year]  SLP Diagnosis: Cognitive Impairments  Rehab Potential: Good ELOS: 7-10 days    Today's Date: 08/08/2020 SLP Individual Time: 2414-1387 SLP Individual Time Calculation (min): 38 min   Hospital Problem: Active Problems:   Traumatic subdural hematoma [facility])  Past Medical History:  Past Medical History:  Diagnosis Date   Anemia    Arthritis    knees   Breast cancer (HCC) 2019   Right Breast Cancer   Cancer [facility])    Chest pain Jan 2016   low risk Myoview    CHF (congestive heart failure) (HCC)    Colitis 12/08/2014   [facility]- focal moderate active colitis   Complication of anesthesia 2005   difficulty remembering for a while and waking up   Constipation    Dysrhythmia    h/o A-Fib   Elevated LFTs 2016   [facility]   ESRD on dialysis [facility]) April 2016   MWF [facility]   Fundic gland polyps of stomach, benign    GERD (gastroesophageal reflux disease)    Heart murmur Nov 2015   Aortic scleosis- no stenosis   Hemodialysis patient (HCC)    Hypertension    Shortness of breath dyspnea    with exertion   Past Surgical History:  Past Surgical History:  Procedure Laterality Date   A/V FISTULAGRAM Right 04/22/2019   Procedure: A/V FISTULAGRAM - Right Upper;  Surgeon: Nada Libman, MD;  Location: [facility] INVASIVE CV LAB;  Service: Cardiovascular;  Laterality: Right;   A/V FISTULAGRAM N/A 02/17/2020   Procedure: A/V FISTULAGRAM - Right Upper;  Surgeon: Nada Libman, MD;  Location: [facility] INVASIVE CV LAB;  Service: Cardiovascular;  Laterality: N/A;   ABDOMINAL HYSTERECTOMY  2005   AV FISTULA PLACEMENT Left 12/09/2013   Procedure: INSERTION OF ARTERIOVENOUS (AV) GORE-TEX GRAFT ARM;  Surgeon: Sherren Kerns, MD;  Location: [facility] OR;  Service: Vascular;  Laterality: Left;   AV FISTULA PLACEMENT Right 01/01/2018   Procedure: INSERTION OF  ARTERIOVENOUS (AV) GORE-TEX GRAFT ARM RIGHT ARM;  Surgeon: Sherren Kerns, MD;  Location: [facility] OR;  Service: Vascular;  Laterality: Right;   AV FISTULA PLACEMENT Right 09/17/2018   Procedure: 1ST STAGE BASILIC ARTERIOVENOUS (AV) FISTULA CREATION RIGHT ARM;  Surgeon: Maeola Harman, MD;  Location: [facility] OR;  Service: Vascular;  Laterality: Right;   BASCILIC VEIN TRANSPOSITION Right 11/12/2018   Procedure: SECOND STAGE BASILIC VEIN TRANSPOSITION RIGHT ARM;  Surgeon: Maeola Harman, MD;  Location: [facility] OR;  Service: Vascular;  Laterality: Right;   BREAST BIOPSY  1990's   BUNIONECTOMY Bilateral    CHOLECYSTECTOMY  2005   COLONOSCOPY     CRANIOTOMY Left 08/02/2020   Procedure: CRANIOTOMY HEMATOMA EVACUATION SUBDURAL;  Surgeon: Tia Alert, MD;  Location: [facility] OR;  Service: Neurosurgery;  Laterality: Left;   FISTULOGRAM Right 06/12/2019   Procedure: Fistulogram of right upper arm arteriovenous fistula;  Surgeon: Nada Libman, MD;  Location[facility] OR;  Service: Vascular;  Laterality: Right;   INSERTION OF ILIAC STENT  06/12/2019   Procedure: Insertion Of right basilic vein Stent;  Surgeon: Nada Libman, MD;  Location: [facility] OR;  Service: Vascular;;   IR AV DIALY SHUNT INTRO NEEDLE/INTRACATH INITIAL W/PTA/IMG RIGHT Right 03/04/2019   IR GENERIC HISTORICAL  01/11/2016   IR US GUIDE VASC ACCESS RIGHT 01/11/2016 Gilmer Mor, DO [facility]-INTER[facility] RAD   IR GENERIC HISTORICAL  01/11/2016   IR RADIOLOGY PERIPHERAL GUIDED IV START 01/11/2016 Corrie Mckusick, DO [facility]   IR IMAGING GUIDED PORT INSERTION  07/05/2017   IR REMOVAL TUN CV CATH W/O FL  08/12/2019   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   KNEE ARTHROSCOPY Bilateral    MASTECTOMY W/ SENTINEL NODE BIOPSY Right 11/20/2017   MASTECTOMY W/ SENTINEL NODE BIOPSY Right 11/20/2017   Procedure: RIGHT MASTECTOMY WITH SENTINEL LYMPH NODE BIOPSY;  Surgeon: Coralie Keens, MD;  Location: [place];  Service: General;  Laterality: Right;   PERIPHERAL  VASCULAR BALLOON ANGIOPLASTY  02/17/2020   Procedure: PERIPHERAL VASCULAR BALLOON ANGIOPLASTY;  Surgeon: Serafina Mitchell, MD;  Location: [place] CV LAB;  Service: Cardiovascular;;   PERIPHERAL VASCULAR CATHETERIZATION N/A 08/31/2015   Procedure: A/V Shuntogram;  Surgeon: Serafina Mitchell, MD;  Location: [place] CV LAB;  Service: Cardiovascular;  Laterality: N/A;   PERIPHERAL VASCULAR CATHETERIZATION Left 08/31/2015   Procedure: Peripheral Vascular Balloon Angioplasty;  Surgeon: Serafina Mitchell, MD;  Location: [place] CV LAB;  Service: Cardiovascular;  Laterality: Left;  arm fistula   PERIPHERAL VASCULAR INTERVENTION Right 04/22/2019   Procedure: PERIPHERAL VASCULAR INTERVENTION;  Surgeon: Serafina Mitchell, MD;  Location: [place] CV LAB;  Service: Cardiovascular;  Laterality: Right;  FISTULA   PORT-A-CATH REMOVAL N/A 08/12/2018   Procedure: PORT-A-CATH REMOVAL;  Surgeon: Coralie Keens, MD;  Location: [place];  Service: General;  Laterality: N/A;   [place] Right 12/24/2017   Procedure: UPPER EXTREMITY VENOGRAPHY CENTRAL VENOGRAM;  Surgeon: Waynetta Sandy, MD;  Location: [place] CV LAB;  Service: Cardiovascular;  Laterality: Right;   UPPER EXTREMITY VENOGRAPHY Bilateral 09/02/2018   Procedure: UPPER EXTREMITY VENOGRAPHY;  Surgeon: Waynetta Sandy, MD;  Location: [place] CV LAB;  Service: Cardiovascular;  Laterality: Bilateral;    Assessment / Plan / Recommendation Clinical Impression  Karen [name] is a 65 year old right-handed female with history of diastolic congestive heart failure, colitis, atrial fibrillation, end-stage renal disease on hemodialysis Monday Wednesday Friday [facility] facility, hypertension, right breast cancer 2019.  Per chart review lives with spouse.  Two-level home half bath on main level.  2 steps to entry.  Independent prior to admission and driving.  Presented 08/02/2020 with  a fall 3 weeks ago and noted progressive headaches.  She did see her PCP CT of the head 07/15/2020 negative.  Cranial CT scan 08/02/2020 showed a large left subdural hematoma noted resulting in a 4 mm left-to-right midline shift.  Small left tentorial subdural hematoma also noted as well as a smaller right subdural hematoma.  Underwent left parietal craniotomy for evacuation of subdural hematoma 08/02/2020 per Dr. Sherley Bounds.  Maintained on Keppra for seizure prophylaxis.  Decadron protocol as directed.  Hemodialysis ongoing as per renal services.  Monitoring of blood pressure with history of hypotension maintained on ProAmatine.  Tolerating a regular consistency diet.  Therapy evaluations completed due to patient decreased functional mobility was admitted for a comprehensive rehab program.  SLP evaluation was completed on 08/08/2020 with results as follows:  Pt's evaluation today was limited by lethargy.  Pt scored in the range of mild-moderate impairmen on the visuospatial constructional, memory, and calculation subtests of the Cognistat standardized cognitive assessment.  Outside of feeling fatigued, pt denies any overt cognitive changes post SDH and subsequent craniotomy.   Formal assessment of swallowing function was deferred at this time as pt denies any acute dysphagia and  her CN exam is unremarkable for areas of focal weakness.  Given that pt was independently managing not only her own medical care but also her husband's care in addition to taking care of all their household responsibilities prior to admission, I suggest that pt continue to receive ST services while inpatient.  I am hopeful that with improved alertness pt's mentation will clear and these deficits will resolve.     Skilled Therapeutic Interventions          Cognitive-linguistic evaluation completed with results and recommendations reviewed with patient.    SLP Assessment  Patient will need skilled [facility] [place] [facility]  during CIR admission    Recommendations  Patient destination: Home Follow up Recommendations: Other (comment) (TBD pending progress while inpatient) Equipment Recommended: None recommended by SLP    SLP Frequency 1 to 3 out of 7 days   SLP Duration  SLP Intensity  SLP Treatment/Interventions 7-10 days  Minumum of 1-2 x/day, 30 to 90 minutes  Cognitive remediation/compensation;Cueing hierarchy;Internal/external aids;Functional tasks;Patient/family education    Pain Pain Assessment Pain Scale: 0-10 Pain Score: 5  Pain Type: Acute pain Pain Location: Head Pain Orientation: Anterior Pain Descriptors / Indicators: Headache Pain Frequency: Constant Pain Onset: On-going Pain Intervention(s): Medication (See eMAR)  Prior Functioning Cognitive/Linguistic Baseline: Within functional limits Type of Home: House  Lives With: Spouse Available Help at Discharge: Family;Available PRN/intermittently Vocation: Retired  SLP Evaluation Cognition Overall Cognitive Status: Impaired/Different from baseline Arousal/Alertness: Lethargic Orientation Level: Oriented X4 Attention: Sustained Sustained Attention: Impaired Sustained Attention Impairment: Functional complex Memory: Impaired Memory Impairment: Retrieval deficit Awareness: Appears intact Problem Solving: Impaired Problem Solving Impairment: Functional complex Safety/Judgment: Appears intact Comments: suspect performance on today's evaluation was heavily influenced by fatigue  Comprehension Auditory Comprehension Overall Auditory Comprehension: Appears within functional limits for tasks assessed Expression Expression Primary Mode of Expression: Verbal Verbal Expression Overall Verbal Expression: Appears within functional limits for tasks assessed Oral Motor Oral Motor/Sensory Function Overall Oral Motor/Sensory Function: Within functional limits Motor Speech Overall Motor Speech: Appears within functional limits for  tasks assessed  Care Tool Care Tool Cognition Expression of Ideas and Wants Expression of Ideas and Wants: Without difficulty (complex and basic) - expresses complex messages without difficulty and with speech that is clear and easy to understand   Understanding Verbal and Non-Verbal Content Understanding Verbal and Non-Verbal Content: Understands (complex and basic) - clear comprehension without cues or repetitions   Memory/Recall Ability *first 3 days only Memory/Recall Ability *first 3 days only: Current season;Location of own room;Staff names and faces;That he or she is in a hospital/hospital unit        Short Term Goals: Week 1: SLP Short Term Goal 1 (Week 1): STG=LTG due to ELOS  Refer to Care Plan for Long Term Goals  Recommendations for other services: None   Discharge Criteria: Patient will be discharged from SLP if patient refuses treatment 3 consecutive times without medical reason, if treatment goals not met, if there is a change in medical status, if patient makes no progress towards goals or if patient is discharged from hospital.  The above assessment, treatment plan, treatment alternatives and goals were discussed and mutually agreed upon: by patient  Emilio Math 08/08/2020, 3:56 PM

## 2020-08-08 NOTE — Progress Notes (Signed)
Patient ID: Karen [name], female   DOB: 1955/10/03, 65 y.o.   MRN: [id number] BP (!) 152/83 (BP Location: Left Wrist)   Pulse (!) 102   Temp 97.8 F (36.6 C) (Oral)   Resp 20   Ht 5\' 1"  (1.549 m)   Wt 93.7 kg   LMP  (LMP Unknown)   SpO2 100%   BMI 39.03 kg/m  Alert and oriented x 4, speech is clear and fluent Perrl, full eom Moving all extremities well Wound is clean, dry, and without signs of infection Feels much better than she did yesterday Has worked well with rehab staff today.  No neurological concerns

## 2020-08-08 NOTE — Progress Notes (Signed)
Signed                                                                                                                                                                                                                                                                                                                                                                                                                                                                                   PMR Admission Coordinator Pre-Admission Assessment   Patient: Karen [name] is an 65 y.o., female MRN: [id number] DOB: Jul 13, 1955 Height: $RemoveBefo'5\' 1"'ZPSiVJeHueQ$  (154.9 cm) Weight: 91.3 kg   Insurance Information HMO:     PPO: yes     PCP:      IPA:      80/20:      OTHER: PRIMARY: [employer]      Policy#: [id number]      Subscriber: pt CM Name:       Phone#: [phone] Fax#: [phone] Pre-Cert#: [id number]   Received approval from [employer] on 08/06/20. Pt approved for 7 days beginning 08/06/20 with updates due 08/12/20   Employer: Benefits:  Phone #: [phone]     Name: 7/20 Eff. Date: 01/17/2020  Deduct: $200      Out of Pocket Max: $2200      Life Max: none CIR: After deductible 100% covered      SNF: after deductible, 100% covered Outpatient: 100%     Co-Pay: visits per medical neccesity Home Health: 100%      Co-Pay: visits per medical neccesity DME: 100%     Co-Pay: none Providers: in network  SECONDARY: none   financial Counselor:       Phone#:   The Engineer, [employer]" for patients in Inpatient Rehabilitation Facilities with attached "Privacy Act [place] Records" was provided and verbally reviewed with: Patient   Emergency Contact Information Contact Information       Name Relation Home Work Mobile     [place] Son     [place] Daughter     [phone]    Mays,Latoya Daughter     [phone]           Current Medical History  Patient Admitting Diagnosis: SDH   History of Present Illness: 65 year old right-handed female with history of diastolic congestive heart failure, colitis, atrial fibrillation, end-stage renal disease on hemodialysis Monday Wednesday Friday [facility] facility, hypertension, right breast cancer 2019.    Presented 08/02/2020 with a fall 3 weeks ago and noted progressive headaches.  She did see her PCP CT of the head 07/15/2020 negative.  Cranial CT scan 08/02/2020 showed a large left subdural hematoma noted resulting in a 4 mm left-to-right midline shift.  Small left tentorial subdural hematoma also noted as well as a smaller right subdural hematoma.  Underwent left parietal craniotomy for evacuation of subdural hematoma 08/02/2020 per Dr. Sherley Bounds.  Maintained on Keppra for seizure prophylaxis.  Decadron protocol as directed.  Hemodialysis ongoing as per renal services.  Monitoring of blood pressure with history of hypotension maintained on ProAmatine.  Tolerating a regular consistency diet.   Patient's medical record from [facility]  has been reviewed by the rehabilitation admission coordinator and physician.   Past Medical History      Past Medical History:  Diagnosis Date   Anemia     Arthritis      knees   Breast cancer ([place]) 2019    Right Breast Cancer   Cancer [facility])     Chest pain Jan 2016    low risk Myoview   CHF (congestive heart failure) ([place])     Colitis 12/08/2014    [facility]- focal moderate active colitis   Complication of anesthesia 2005    difficulty remembering for a while and waking up   Constipation     Dysrhythmia      h/o A-Fib   Elevated LFTs 2016    [facility]   ESRD on dialysis [facility]) April 2016    MWF [place]   Fundic gland polyps of stomach, benign     GERD  (gastroesophageal reflux disease)     Heart murmur Nov 2015    Aortic scleosis- no stenosis   Hemodialysis patient ([place])     Hypertension     Shortness of breath dyspnea      with exertion      Family History   family history includes Diabetes in her sister; Heart attack in her father; Hyperlipidemia in her sister; Hypertension in her sister; Kidney disease in her father, mother, and sister.   Prior Rehab/Hospitalizations Has the patient had prior rehab or hospitalizations prior to admission? Yes   Has the  patient had major surgery during 100 days prior to admission? Yes              Current Medications   Current Facility-Administered Medications:   acetaminophen (TYLENOL) tablet 650 mg, 650 mg, Oral, Q4H PRN, 650 mg at 08/06/20 1959 **OR** acetaminophen (TYLENOL) suppository 650 mg, 650 mg, Rectal, Q4H PRN, Eustace Moore, MD   Chlorhexidine Gluconate Cloth 2 % PADS 6 each, 6 each, Topical, Daily, Eustace Moore, MD, 6 each at 08/07/20 [id number]   Chlorhexidine Gluconate Cloth 2 % PADS 6 each, 6 each, Topical, Q0600, Loren Racer, PA-C, 6 each at 08/07/20 6256   diltiazem (CARDIZEM) tablet 30 mg, 30 mg, Oral, Q M,W,F, Eustace Moore, MD, 30 mg at 08/06/20 3893   HYDROcodone-acetaminophen (NORCO/VICODIN) 5-325 MG per tablet 1 tablet, 1 tablet, Oral, Q6H PRN, Eustace Moore, MD, 1 tablet at 08/07/20 0439   labetalol (NORMODYNE) injection 10-40 mg, 10-40 mg, Intravenous, Q10 min PRN, Eustace Moore, MD   levETIRAcetam (KEPPRA) tablet 250 mg, 250 mg, Oral, BID, Eustace Moore, MD, 250 mg at 08/07/20 0815   lidocaine-prilocaine (EMLA) cream 1 application, 1 application, Topical, See admin instructions, Eustace Moore, MD   midodrine (PROAMATINE) tablet 20 mg, 20 mg, Oral, Q M,W,F, Eustace Moore, MD   morphine 2 MG/ML injection 1-2 mg, 1-2 mg, Intravenous, Q2H PRN, Eustace Moore, MD   multivitamin (RENA-VIT) tablet 1 tablet, 1 tablet, Oral, Daily, Eustace Moore, MD, 1 tablet at 08/07/20  0814   ondansetron [facility]) tablet 4 mg, 4 mg, Oral, Q4H PRN, 4 mg at 08/06/20 0745 **OR** ondansetron (ZOFRAN) injection 4 mg, 4 mg, Intravenous, Q4H PRN, Eustace Moore, MD, 4 mg at 08/06/20 1619   ondansetron [facility]) tablet 8 mg, 8 mg, Oral, Q8H PRN, Eustace Moore, MD, 8 mg at 08/05/20 1227   pantoprazole (PROTONIX) EC tablet 40 mg, 40 mg, Oral, Daily, Eustace Moore, MD, 40 mg at 08/07/20 0815   promethazine (PHENERGAN) tablet 12.5-25 mg, 12.5-25 mg, Oral, Q4H PRN, Eustace Moore, MD   senna [facility]) tablet 8.6 mg, 1 tablet, Oral, BID, Eustace Moore, MD, 8.6 mg at 08/07/20 0815   sevelamer carbonate (RENVELA) tablet 2,400 mg, 2,400 mg, Oral, TID WC, 2,400 mg at 08/07/20 0814 **AND** sevelamer carbonate (RENVELA) tablet 1,600 mg, 1,600 mg, Oral, PRN, Eustace Moore, MD   sodium zirconium cyclosilicate (LOKELMA) packet 10 g, 10 g, Oral, BID, Donato Heinz, MD, 10 g at 08/07/20 0815   Patients Current Diet:  Diet Order                  Diet renal with fluid restriction Fluid restriction: 1200 mL Fluid; Room service appropriate? Yes; Fluid consistency: Thin  Diet effective now                       Precautions / Restrictions Precautions Precautions: Fall Precaution Comments: EVD; clamp before mobility Restrictions Weight Bearing Restrictions: No    Has the patient had 2 or more falls or a fall with injury in the past year? Yes   Prior Activity Level Community (5-7x/wk): Independent, driving, caregiver for her bedridden spouse   Prior Functional Level Self Care: Did the patient need help bathing, dressing, using the toilet or eating? Independent   Indoor Mobility: Did the patient need assistance with walking from room to room (with or without device)? Independent   Stairs: Did the patient need assistance with  internal or external stairs (with or without device)? Independent   Functional Cognition: Did the patient need help planning regular tasks such as shopping or  remembering to take medications? Independent   Home Assistive Devices / Equipment Home Equipment:  (lift chair for spouse, spouse does not walk so uses wheelchair)   Prior Device Use: Indicate devices/aids used by the patient prior to current illness, exacerbation or injury? None of the above   Current Functional Level Cognition   Overall Cognitive Status: Within Functional Limits for tasks assessed Orientation Level: Oriented X4 General Comments: Pt participating naming task going from animals that start with A, B, C...etc while performing functional mobility in the hallway. Pt reporting she is trying to rest, but has difficulty trying not to think about what is going on at home.    Extremity Assessment (includes Sensation/Coordination)   Upper Extremity Assessment: Overall WFL for tasks assessed  Lower Extremity Assessment: Defer to PT evaluation RLE Deficits / Details: Strength 5/5 LLE Deficits / Details: Strength 5/5     ADLs   Overall ADL's : Needs assistance/impaired Eating/Feeding: Set up, Bed level Grooming: Oral care, Min guard, Standing Upper Body Bathing: Min guard, Standing Lower Body Bathing: Min guard, Sit to/from stand Upper Body Dressing : Min guard, Standing Toilet Transfer: Min Psychiatric nurse Details (indicate cue type and reason): Simulated toilet transfer from EOB Functional mobility during ADLs: Min guard General ADL Comments: Focus session on higher level balance activities in preparation for return to IADLs and being a primary caregiver to her husband. Performing functional mobility in hall with min Guard A.     Mobility   Overal bed mobility: Modified Independent Bed Mobility: Supine to Sit Supine to sit: Supervision Sit to supine: Supervision General bed mobility comments: Mod I for increased time.     Transfers   Overall transfer level: Needs assistance Equipment used: None Transfers: Sit to/from Stand Sit to Stand: Supervision General  transfer comment: supervision for safety, no physical assistance required.     Ambulation / Gait / Stairs / Wheelchair Mobility   Ambulation/Gait Ambulation/Gait assistance: Min guard, Herbalist (Feet): 250 Feet Assistive device: None Gait Pattern/deviations: Step-through pattern, Decreased stride length, Narrow base of support General Gait Details: slow guarded gait throughout with limited arm swing and head motion with open environment. MinA with introduction of dynamic activities (i.e head turns, obstacle negotiation). Mild L inattention noted with tending to glide closely by obstacles on L without awareness Gait velocity: decreased Stairs: Yes Stairs assistance: Min guard Stair Management: One rail Right, Step to pattern, Forwards Number of Stairs: 4 General stair comments: min guard for safety with use of R hand rail     Posture / Balance Balance Overall balance assessment: Needs assistance Sitting-balance support: Feet supported Sitting balance-Leahy Scale: Good Standing balance support: No upper extremity supported, During functional activity Standing balance-Leahy Scale: Good Standing balance comment: Min Guard A with dynamic reaching task     Special needs/care consideration HD MWF Drove self to hemodialysis She is caregiver for her spouse who is bedridden. He had CVA 3 years ago and in March she removed him from SNF to be his caregiver    Previous Home Environment  Living Arrangements: Spouse/significant other  Lives With: Spouse Available Help at Discharge: Family, Available PRN/intermittently Type of Home: House Home Layout: Two level, 1/2 bath on main level (bedrooms are upstairs currently have made sunroom a bedroom for spouse, pt states i can sleep in my spouse  lift chair) Alternate Level Stairs-Rails: Left Alternate Level Stairs-Number of Steps: 14 Home Access: Stairs to enter Entrance Stairs-Rails: None Entrance Stairs-Number of Steps: 2 Bathroom  Shower/Tub: Multimedia programmer: Standard Bathroom Accessibility: Yes How Accessible: Accessible via walker Home Care Services: No Additional Comments: electrical lift to transfer spouse, son is helping with spouse at this time. no caregivers other than patient for spouse   Discharge Living Setting Plans for Discharge Living Setting: Patient's home, Lives with (comment) (spouse) Type of Home at Discharge: House Discharge Home Layout: Two level, 1/2 bath on main level, Bed/bath upstairs Alternate Level Stairs-Rails: Left Alternate Level Stairs-Number of Steps: 14 Discharge Home Access: Stairs to enter Entrance Stairs-Rails: None Entrance Stairs-Number of Steps: 2 Discharge Bathroom Shower/Tub: Horticulturist, commercial: Standard Discharge Bathroom Accessibility: Yes How Accessible: Accessible via walker Does the patient have any problems obtaining your medications?: No   Social/Family/Support Systems Patient Roles: Spouse, Caregiver (she is caregiver for her bedridden spouse ( h/o CVA)) Contact Information: son Eustace Moore Anticipated Caregiver: son intermittent asisst only Anticipated Ambulance person Information: see above Ability/Limitations of Caregiver: son is a Stephanie Coup, daughter recently moved to [place] Caregiver Availability: Intermittent Discharge Plan Discussed with Primary Caregiver: Yes Is Caregiver In Agreement with Plan?: Yes Does Caregiver/Family have Issues with Lodging/Transportation while Pt is in Rehab?: No   Very limited caregiver supports for herself and her spouse   Goals Patient/Family Goal for Rehab: Mod I with PT and OT Expected length of stay: ELOS 7 to 10 days Additional Information: Hemodialysis Pt/Family Agrees to Admission and willing to participate: Yes Program Orientation Provided & Reviewed with Pt/Caregiver Including Roles  & Responsibilities: Yes   Decrease burden of Care through IP rehab admission: n/a   Possible  need for SNF placement upon discharge: not anticipated   Patient Condition: I have reviewed medical records from [facility], spoken with CM, and patient. I met with patient at the bedside for inpatient rehabilitation assessment.  Patient will benefit from ongoing PT and OT, can actively participate in 3 hours of therapy a day 5 days of the week, and can make measurable gains during the admission.  Patient will also benefit from the coordinated team approach during an Inpatient Acute Rehabilitation admission.  The patient will receive intensive therapy as well as Rehabilitation physician, nursing, social worker, and care management interventions.  Due to bladder management, bowel management, safety, skin/wound care, disease management, medication administration, pain management, and patient education the patient requires 24 hour a day rehabilitation nursing.  The patient is currently Mod I-Min A with mobility and Min G with basic ADLs.  Discharge setting and therapy post discharge at home with home health is anticipated.  Patient has agreed to participate in the Acute Inpatient Rehabilitation Program and will admit today.   Preadmission Screen Completed By: Danne Baxter RN MSN with updates by Gayland Curry, 08/07/2020 9:46 AM ______________________________________________________________________   Discussed status with Dr. Letta Pate on 08/07/20  at 9:46 AM and received approval for admission today.   Admission Coordinator: Danne Baxter RN MSN with updates by Bethel Born, CCC-SLP, time 9:46 AM/Date 08/07/20     Assessment/Plan: Diagnosis:decline in mobiltiy and ADL due to traumatic Left SDH s/p evacuation Does the need for close, 24 hr/day Medical supervision in concert with the patient's rehab needs make it unreasonable for this patient to be served in a less intensive setting? Yes Co-Morbidities requiring supervision/potential complications: A fib, ESRD, d CHF Due to  bladder management,  bowel management, safety, skin/wound care, disease management, medication administration, pain management, and patient education, does the patient require 24 hr/day rehab nursing? Yes Does the patient require coordinated care of a physician, rehab nurse, PT, OT, and SLP to address physical and functional deficits in the context of the above medical diagnosis(es)? Yes Addressing deficits in the following areas: balance, endurance, locomotion, strength, transferring, bowel/bladder control, bathing, dressing, feeding, and psychosocial support Can the patient actively participate in an intensive therapy program of at least 3 hrs of therapy 5 days a week? Yes The potential for patient to make measurable gains while on inpatient rehab is good Anticipated functional outcomes upon discharge from inpatient rehab: modified independent PT, modified independent and supervision OT, modified independent and supervision SLP Estimated rehab length of stay to reach the above functional goals is: 7-10d Anticipated discharge destination: Home 10. Overall Rehab/Functional Prognosis: good     MD Signature: Charlett Blake M.D. [place] [place] [facility] Fellow Am Acad of Phys Med and Rehab Diplomate Am Board of Electrodiagnostic Med Fellow Am Board of Interventional Pain

## 2020-08-08 NOTE — Evaluation (Signed)
Physical Therapy Assessment and Plan  Patient Details  Name: Karen [name] MRN: [id number] Date of Birth: 05-28-1955  PT Diagnosis: Abnormal posture, Abnormality of gait, Difficulty walking, Edema, Muscle weakness, and Pain in head Rehab Potential: Good ELOS: 10-14 days   Today's Date: 08/08/2020 PT Individual Time: 1100-1140 PT Individual Time Calculation (min): 40 min    Hospital Problem: Active Problems:   Traumatic subdural hematoma [facility])   Past Medical History:  Past Medical History:  Diagnosis Date   Anemia    Arthritis    knees   Breast cancer ([place]) 2019   Right Breast Cancer   Cancer [facility])    Chest pain Jan 2016   low risk Myoview    CHF (congestive heart failure) ([place])    Colitis 12/08/2014   [facility]- focal moderate active colitis   Complication of anesthesia 2005   difficulty remembering for a while and waking up   Constipation    Dysrhythmia    h/o A-Fib   Elevated LFTs 2016   [facility]   ESRD on dialysis [facility]) April 2016   MWF [place]   Fundic gland polyps of stomach, benign    GERD (gastroesophageal reflux disease)    Heart murmur Nov 2015   Aortic scleosis- no stenosis   Hemodialysis patient ([place])    Hypertension    Shortness of breath dyspnea    with exertion   Past Surgical History:  Past Surgical History:  Procedure Laterality Date   A/V FISTULAGRAM Right 04/22/2019   Procedure: A/V FISTULAGRAM - Right Upper;  Surgeon: Serafina Mitchell, MD;  Location: [place] CV LAB;  Service: Cardiovascular;  Laterality: Right;   A/V FISTULAGRAM N/A 02/17/2020   Procedure: A/V FISTULAGRAM - Right Upper;  Surgeon: Serafina Mitchell, MD;  Location: [place] CV LAB;  Service: Cardiovascular;  Laterality: N/A;   ABDOMINAL HYSTERECTOMY  2005   AV FISTULA PLACEMENT Left 12/09/2013   Procedure: INSERTION OF ARTERIOVENOUS (AV) GORE-TEX GRAFT ARM;  Surgeon: Elam Dutch, MD;  Location: [place];  Service: Vascular;  Laterality: Left;   AV  FISTULA PLACEMENT Right 01/01/2018   Procedure: INSERTION OF ARTERIOVENOUS (AV) GORE-TEX GRAFT ARM RIGHT ARM;  Surgeon: Elam Dutch, MD;  Location: [place];  Service: Vascular;  Laterality: Right;   AV FISTULA PLACEMENT Right 09/17/2018   Procedure: 1ST STAGE BASILIC ARTERIOVENOUS (AV) FISTULA CREATION RIGHT ARM;  Surgeon: Waynetta Sandy, MD;  Location: [place];  Service: Vascular;  Laterality: Right;   [place] Right 11/12/2018   Procedure: SECOND STAGE BASILIC VEIN TRANSPOSITION RIGHT ARM;  Surgeon: Waynetta Sandy, MD;  Location: [place];  Service: Vascular;  Laterality: Right;   BREAST BIOPSY  1990's   BUNIONECTOMY Bilateral    CHOLECYSTECTOMY  2005   COLONOSCOPY     CRANIOTOMY Left 08/02/2020   Procedure: CRANIOTOMY HEMATOMA EVACUATION SUBDURAL;  Surgeon: Eustace Moore, MD;  Location: [place];  Service: Neurosurgery;  Laterality: Left;   FISTULOGRAM Right 06/12/2019   Procedure: Fistulogram of right upper arm arteriovenous fistula;  Surgeon: Serafina Mitchell, MD;  Location: [facility] OR;  Service: Vascular;  Laterality: Right;   INSERTION OF ILIAC STENT  06/12/2019   Procedure: Insertion Of right basilic vein Stent;  Surgeon: Serafina Mitchell, MD;  Location: [place];  Service: Vascular;;   IR AV DIALY SHUNT INTRO [place] W/PTA/IMG RIGHT Right 03/04/2019   IR GENERIC HISTORICAL  01/11/2016   IR US GUIDE VASC ACCESS RIGHT 01/11/2016  Corrie Mckusick, DO [facility]-INTERV RAD   IR GENERIC HISTORICAL  01/11/2016   IR RADIOLOGY PERIPHERAL GUIDED IV START 01/11/2016 Corrie Mckusick, DO [facility]-INTER[facility]   IR IMAGING GUIDED PORT INSERTION  07/05/2017   IR REMOVAL TUN CV CATH W/O FL  08/12/2019   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   KNEE ARTHROSCOPY Bilateral    MASTECTOMY W/ SENTINEL NODE BIOPSY Right 11/20/2017   MASTECTOMY W/ SENTINEL NODE BIOPSY Right 11/20/2017   Procedure: RIGHT MASTECTOMY WITH SENTINEL LYMPH NODE BIOPSY;  Surgeon: Coralie Keens, MD;   Location: [place];  Service: General;  Laterality: Right;   PERIPHERAL VASCULAR BALLOON ANGIOPLASTY  02/17/2020   Procedure: PERIPHERAL VASCULAR BALLOON ANGIOPLASTY;  Surgeon: Serafina Mitchell, MD;  Location: [place] CV LAB;  Service: Cardiovascular;;   PERIPHERAL VASCULAR CATHETERIZATION N/A 08/31/2015   Procedure: A/V Shuntogram;  Surgeon: Serafina Mitchell, MD;  Location: [place] CV LAB;  Service: Cardiovascular;  Laterality: N/A;   PERIPHERAL VASCULAR CATHETERIZATION Left 08/31/2015   Procedure: Peripheral Vascular Balloon Angioplasty;  Surgeon: Serafina Mitchell, MD;  Location: [place] CV LAB;  Service: Cardiovascular;  Laterality: Left;  arm fistula   PERIPHERAL VASCULAR INTERVENTION Right 04/22/2019   Procedure: PERIPHERAL VASCULAR INTERVENTION;  Surgeon: Serafina Mitchell, MD;  Location: [place] CV LAB;  Service: Cardiovascular;  Laterality: Right;  FISTULA   PORT-A-CATH REMOVAL N/A 08/12/2018   Procedure: PORT-A-CATH REMOVAL;  Surgeon: Coralie Keens, MD;  Location: [place];  Service: General;  Laterality: N/A;   [place] Right 12/24/2017   Procedure: UPPER EXTREMITY VENOGRAPHY CENTRAL VENOGRAM;  Surgeon: Waynetta Sandy, MD;  Location: [place] CV LAB;  Service: Cardiovascular;  Laterality: Right;   UPPER EXTREMITY VENOGRAPHY Bilateral 09/02/2018   Procedure: UPPER EXTREMITY VENOGRAPHY;  Surgeon: Waynetta Sandy, MD;  Location: [place] CV LAB;  Service: Cardiovascular;  Laterality: Bilateral;    Assessment & Plan Clinical Impression: Patient is a 65 y.o. year old right-handed female with history of diastolic congestive heart failure, colitis, atrial fibrillation, end-stage renal disease on hemodialysis Monday Wednesday Friday [facility] facility, hypertension, right breast cancer 2019.  Per chart review lives with spouse.  Two-level home half bath on main level.  2 steps to entry.  Independent prior to  admission and driving.  Presented 08/02/2020 with a fall 3 weeks ago and noted progressive headaches.  She did see her PCP CT of the head 07/15/2020 negative.  Cranial CT scan 08/02/2020 showed a large left subdural hematoma noted resulting in a 4 mm left-to-right midline shift.  Small left tentorial subdural hematoma also noted as well as a smaller right subdural hematoma.  Underwent left parietal craniotomy for evacuation of subdural hematoma 08/02/2020 per Dr. Sherley Bounds.  Maintained on Keppra for seizure prophylaxis.  Decadron protocol as directed.  Hemodialysis ongoing as per renal services.  Monitoring of blood pressure with history of hypotension maintained on ProAmatine.  Tolerating a regular consistency diet.  Therapy evaluations completed due to patient decreased functional mobility was admitted for a comprehensive rehab program. Patient transferred to CIR on 08/07/2020 .   Patient currently requires min with mobility secondary to muscle weakness, decreased cardiorespiratoy endurance, and decreased sitting balance, decreased standing balance, decreased postural control, and decreased balance strategies.  Prior to hospitalization, patient was independent  with mobility and lived with Spouse in a House home.  Home access is 2Stairs to enter.  Patient will benefit from skilled PT  intervention to maximize safe functional mobility, minimize fall risk, and decrease caregiver burden for planned discharge home with intermittent assist.  Anticipate patient will benefit from follow up [facility] at discharge.  PT - End of Session Activity Tolerance: Tolerates 10 - 20 min activity with multiple rests Endurance Deficit: Yes Endurance Deficit Description: intermittent lethargy and fatigue limiting mobility PT Assessment Rehab Potential (ACUTE/IP ONLY): Good PT Barriers to Discharge: Inaccessible home environment;Decreased caregiver support;Lack of/limited family support;Home environment access/layout;Hemodialysis PT  Barriers to Discharge Comments: pt is primary caregiver for her husband in a 2 level home with bed/bath upstairs, reports she is recruiting family to assist with her and her husbands care at d/c, will need follow-up to comfirm PT Patient demonstrates impairments in the following area(s): Balance;Edema;Sensory;Endurance;Motor;Nutrition;Pain;Skin Integrity PT Transfers Functional Problem(s): Bed Mobility;Bed to Chair;Car;Furniture PT Locomotion Functional Problem(s): Ambulation;Wheelchair Mobility;Stairs PT Plan PT Intensity: Minimum of 1-2 x/day ,45 to 90 minutes PT Frequency: 5 out of 7 days PT Duration Estimated Length of Stay: 10-14 days PT Treatment/Interventions: Ambulation/gait training;Discharge planning;DME/adaptive equipment instruction;Functional mobility training;Pain management;Psychosocial support;Splinting/orthotics;Therapeutic Activities;UE/LE Strength taining/ROM;Visual/perceptual remediation/compensation;Wheelchair propulsion/positioning;UE/LE Coordination activities;Therapeutic Exercise;Stair training;Skin care/wound management;Patient/family education;Neuromuscular re-education;Functional electrical stimulation;Disease management/prevention;Community reintegration;Balance/vestibular training;Cognitive remediation/compensation PT Transfers Anticipated Outcome(s): mod I using LRAD PT Locomotion Anticipated Outcome(s): mod I household and supervision community mobility using LRAD PT Recommendation Recommendations for Other Services: Therapeutic Recreation consult Therapeutic Recreation Interventions: Stress management Follow Up Recommendations: Home health PT Patient destination: Home Equipment Recommended: To be determined Equipment Details: [employer] RW at d/c, will determine DME needs based on pt progress   PT Evaluation Precautions/Restrictions Precautions Precautions: Fall Precaution Comments: R UE fistula Restrictions Weight Bearing Restrictions: No General PT Amount  of Missed Time (min): 20 Minutes PT Missed Treatment Reason: Patient fatigue  Home Living/Prior Functioning Home Living Available Help at Discharge: Family;Available PRN/intermittently Type of Home: House Home Access: Stairs to enter [employer] of Steps: 2 Entrance Stairs-Rails: None Home Layout: Two level;1/2 bath on main level (bedrooms are upstairs currently have made sunroom a bedroom for spouse, pt states i can sleep in my spouse lift chair) Alternate Level Stairs-Number of Steps: 14 Alternate Level Stairs-Rails: Left Bathroom Shower/Tub:  (second floor) Bathroom Toilet: Standard Additional Comments: electrical lift to transfer spouse, son and grandchildren helping with spouse and pt hopes will assist pt and her husband at d/c  Lives With: Spouse Prior Function Level of Independence: Independent with basic ADLs;Independent with gait;Independent with homemaking with ambulation;Independent with transfers  Able to Take Stairs?: Reciprically Driving: Yes Vocation: Retired Comments: drives to HD MWF Vision/Perception  Vision - Assessment Eye Alignment: Within Functional Limits Ocular Range of Motion: Within Functional Limits Alignment/Gaze Preference: Within Defined Limits Tracking/Visual Pursuits: Able to track stimulus in all quads without difficulty Saccades: Within functional limits Convergence: Within functional limits Perception Perception: Within Functional Limits Praxis Praxis: Intact  Cognition Overall Cognitive Status: Within Functional Limits for tasks assessed Arousal/Alertness: Lethargic (intermittent) Orientation Level: Oriented X4 Attention: Sustained Sustained Attention: Impaired Sustained Attention Impairment: Functional complex Memory: Appears intact Memory Impairment: Retrieval deficit Immediate Memory Recall: Sock;Blue;Bed Memory Recall Sock: Without Cue Memory Recall Blue: Without Cue Memory Recall Bed: Without Cue Awareness: Appears  intact Problem Solving: Appears intact Problem Solving Impairment: Functional complex Safety/Judgment: Appears intact Comments: suspect performance on today's evaluation was heavily influenced by fatigue Sensation Sensation Light Touch: Appears Intact Proprioception: Appears Intact Coordination Gross Motor Movements are Fluid and Coordinated: No Fine Motor Movements are Fluid and Coordinated: Yes Coordination and Movement Description: slow and deliberate movements,  decreased postural control Finger Nose Finger Test: [facility] Motor  Motor Motor: Abnormal postural alignment and control   Trunk/Postural Assessment  Cervical Assessment Cervical Assessment: Exceptions to [facility] (mild forward head) Thoracic Assessment Thoracic Assessment: Exceptions to [facility] (mild kyphosis) Lumbar Assessment Lumbar Assessment: Exceptions to [facility] (anterior pelvic tilt) Postural Control Postural Control: Deficits on evaluation (decreased/delayed)  Balance Balance Balance Assessed: Yes Dynamic Sitting Balance Dynamic Sitting - Balance Support: No upper extremity supported;Feet supported Dynamic Sitting - Level of Assistance: 5: Stand by assistance Static Standing Balance Static Standing - Balance Support: During functional activity;Right upper extremity supported;Left upper extremity supported Static Standing - Level of Assistance: 4: Min assist Dynamic Standing Balance Dynamic Standing - Balance Support: Right upper extremity supported;Left upper extremity supported;During functional activity Dynamic Standing - Level of Assistance: 4: Min assist Extremity Assessment  RUE Assessment RUE Assessment: Within Functional Limits LUE Assessment LUE Assessment: Within Functional Limits RLE Assessment RLE Assessment: Exceptions to [facility] Active Range of Motion (AROM) Comments: WFL for all functional mobility, except hip flexion limited in sitting due to body habitus General Strength Comments: Grossly 4 to 4+/5  throughout in sitting LLE Assessment LLE Assessment: Exceptions to [facility] Active Range of Motion (AROM) Comments: WFL for all functional mobility, except hip flexion limited in sitting due to body habitus General Strength Comments: Grossly 4 to 4+/5 throughout in sitting  Care Tool Care Tool Bed Mobility Roll left and right activity   Roll left and right assist level: Supervision/Verbal cueing    Sit to lying activity   Sit to lying assist level: Supervision/Verbal cueing    Lying to sitting edge of bed activity   Lying to sitting edge of bed assist level: Supervision/Verbal cueing     Care Tool Transfers Sit to stand transfer   Sit to stand assist level: Minimal Assistance - Patient > 75%    Chair/bed transfer   Chair/bed transfer assist level: Minimal Assistance - Patient > 75%     Toilet transfer   Assist Level: Minimal Assistance - Patient > 75%    Car transfer Car transfer activity did not occur: Safety/medical concerns (evaluation limited by lethargy/fatigue)        Care Tool Locomotion Ambulation   Assist level: Minimal Assistance - Patient > 75% Assistive device: Hand held assist Max distance: 25 ft  Walk 10 feet activity   Assist level: Minimal Assistance - Patient > 75% Assistive device: Hand held assist   Walk 50 feet with 2 turns activity Walk 50 feet with 2 turns activity did not occur: Safety/medical concerns (limited by lethargy/fatigue)      Walk 150 feet activity Walk 150 feet activity did not occur: Safety/medical concerns      Walk 10 feet on uneven surfaces activity Walk 10 feet on uneven surfaces activity did not occur: Safety/medical concerns      Stairs Stair activity did not occur: Safety/medical concerns        Walk up/down 1 step activity Walk up/down 1 step or curb (drop down) activity did not occur: Safety/medical concerns     Walk up/down 4 steps activity did not occuR: Safety/medical concerns  Walk up/down 4 steps activity       Walk up/down 12 steps activity Walk up/down 12 steps activity did not occur: Safety/medical concerns      Pick up small objects from floor Pick up small object from the floor (from standing position) activity did not occur: Safety/medical concerns      Wheelchair  Wheelchair activity did not occur: Safety/medical concerns (limited assessment at eval due to lethargy/fatigue)      Wheel 50 feet with 2 turns activity Wheelchair 50 feet with 2 turns activity did not occur: Safety/medical concerns    Wheel 150 feet activity Wheelchair 150 feet activity did not occur: Safety/medical concerns      Refer to Care Plan for Long Term Goals  SHORT TERM GOAL WEEK 1 PT Short Term Goal 1 (Week 1): Patient will complete standardized balance assessment. PT Short Term Goal 2 (Week 1): Patient will perform basic transfers with close supervision >75% of the time using LRAD. PT Short Term Goal 3 (Week 1): Patient will ambulate >250 ft using LRAD with CGA. PT Short Term Goal 4 (Week 1): Patient will perform at least 4 steps with 1 rail with min A.  Recommendations for other services: Therapeutic Recreation  Stress management  Skilled Therapeutic Intervention Evaluation completed (see details above and below) with education on PT POC and goals and individual treatment initiated with focus on functional mobility/transfers, LE strength, dynamic standing balance/coordination, ambulation, stair navigation, simulated car transfers, and improved endurance with activity Patient provided with 20"x16" wheelchair with standard cushion and adjustments made to promote optimal seating posture and pressure distribution. Patient also provided with RW for use in room and therapist adjusted to proper height for patient.  Patient in bed upon PT arrival. Patient reports increased fatigue and intermittently lethargic with eyes closed during session. Patient reported 1-2/10 headache during session, RN made aware. PT  provided repositioning, rest breaks, and distraction as pain interventions throughout session.   Therapeutic Activity: Bed Mobility: Patient performed rolling R/L and supine to/from sit with supervision without use of bed rails with HOB raised to 30 deg per MD orders. Transfers: Patient performed sit to/from stand from the bed, toilet, and recliner with min A with HHA and use of grab bar from the toilet. Provided verbal cues for forward weight shift and erect posture to stand. Patient was continent of bowl and bladder during toileting. Required increased time due to constipation. Educated on rocking technique and relaxation for reduced straining. Also educated on TBI symptoms and recovery. Patient required total A for peri-care due to fatigue in standing impairing balance without upper extremity support.   Gait Training:  Patient ambulated 15 feet and 25 feet using HHA with min A in the room due to increased fatigue and patient reporting a strong desire to rest. Ambulated with decreased gait speed, decreased step length and height, narrow BOS, increased B hip and knee flexion in stance, forward trunk lean, and downward head gaze. Provided verbal cues for erect posture, looking ahead, and increased step height and length.  Instructed pt in results of PT evaluation as detailed above, PT POC, rehab potential, rehab goals, and discharge recommendations. Additionally discussed CIR's policies regarding fall safety and use of chair alarm and/or quick release belt. Pt verbalized understanding and in agreement. Will update pt's family members as they become available.   Patient in recliner with her eyes closed at end of session with breaks locked, chair alarm set, and all needs within reach. Patient not able to return to bed due to soiled sheets from dried blood of unknown source. NT made aware.  Discharge Criteria: Patient will be discharged from PT if patient refuses treatment 3 consecutive times without  medical reason, if treatment goals not met, if there is a change in medical status, if patient makes no progress towards goals or if patient is  discharged from hospital.  The above assessment, treatment plan, treatment alternatives and goals were discussed and mutually agreed upon: by patient  Doreene Burke PT, DPT  08/08/2020, 5:06 PM

## 2020-08-08 NOTE — Evaluation (Signed)
Occupational Therapy Assessment and Plan  Patient Details  Name: Karen [name] MRN: [id number] Date of Birth: Sep 25, 1955  OT Diagnosis: muscle weakness (generalized) Rehab Potential:   ELOS: 10-14 days   Today's Date: 08/08/2020 OT Individual Time: 1855-0158 OT Individual Time Calculation (min): 60 min     Hospital Problem: Active Problems:   Traumatic subdural hematoma [facility])   Past Medical History:  Past Medical History:  Diagnosis Date   Anemia    Arthritis    knees   Breast cancer ([place]) 2019   Right Breast Cancer   Cancer [facility])    Chest pain Jan 2016   low risk Myoview    CHF (congestive heart failure) ([place])    Colitis 12/08/2014   [facility]- focal moderate active colitis   Complication of anesthesia 2005   difficulty remembering for a while and waking up   Constipation    Dysrhythmia    h/o A-Fib   Elevated LFTs 2016   [facility]   ESRD on dialysis [facility]) April 2016   MWF [place]   Fundic gland polyps of stomach, benign    GERD (gastroesophageal reflux disease)    Heart murmur Nov 2015   Aortic scleosis- no stenosis   Hemodialysis patient ([place])    Hypertension    Shortness of breath dyspnea    with exertion   Past Surgical History:  Past Surgical History:  Procedure Laterality Date   A/V FISTULAGRAM Right 04/22/2019   Procedure: A/V FISTULAGRAM - Right Upper;  Surgeon: Serafina Mitchell, MD;  Location: [place] CV LAB;  Service: Cardiovascular;  Laterality: Right;   A/V FISTULAGRAM N/A 02/17/2020   Procedure: A/V FISTULAGRAM - Right Upper;  Surgeon: Serafina Mitchell, MD;  Location: [place] CV LAB;  Service: Cardiovascular;  Laterality: N/A;   ABDOMINAL HYSTERECTOMY  2005   AV FISTULA PLACEMENT Left 12/09/2013   Procedure: INSERTION OF ARTERIOVENOUS (AV) GORE-TEX GRAFT ARM;  Surgeon: Elam Dutch, MD;  Location: [place];  Service: Vascular;  Laterality: Left;   AV FISTULA PLACEMENT Right 01/01/2018   Procedure: INSERTION OF  ARTERIOVENOUS (AV) GORE-TEX GRAFT ARM RIGHT ARM;  Surgeon: Elam Dutch, MD;  Location: [place];  Service: Vascular;  Laterality: Right;   AV FISTULA PLACEMENT Right 09/17/2018   Procedure: 1ST STAGE BASILIC ARTERIOVENOUS (AV) FISTULA CREATION RIGHT ARM;  Surgeon: Waynetta Sandy, MD;  Location: [place];  Service: Vascular;  Laterality: Right;   [place] Right 11/12/2018   Procedure: SECOND STAGE BASILIC VEIN TRANSPOSITION RIGHT ARM;  Surgeon: Waynetta Sandy, MD;  Location: [place];  Service: Vascular;  Laterality: Right;   BREAST BIOPSY  1990's   BUNIONECTOMY Bilateral    CHOLECYSTECTOMY  2005   COLONOSCOPY     CRANIOTOMY Left 08/02/2020   Procedure: CRANIOTOMY HEMATOMA EVACUATION SUBDURAL;  Surgeon: Eustace Moore, MD;  Location: [place];  Service: Neurosurgery;  Laterality: Left;   FISTULOGRAM Right 06/12/2019   Procedure: Fistulogram of right upper arm arteriovenous fistula;  Surgeon: Serafina Mitchell, MD;  Location[facility] OR;  Service: Vascular;  Laterality: Right;   INSERTION OF ILIAC STENT  06/12/2019   Procedure: Insertion Of right basilic vein Stent;  Surgeon: Serafina Mitchell, MD;  Location: [facility] OR;  Service: Vascular;;   IR AV DIALY SHUNT INTRO [place] W/PTA/IMG RIGHT Right 03/04/2019   IR GENERIC HISTORICAL  01/11/2016   IR US GUIDE VASC ACCESS RIGHT 01/11/2016 Corrie Mckusick, DO [facility]-INTERV [facility]   IR GENERIC  HISTORICAL  01/11/2016   IR RADIOLOGY PERIPHERAL GUIDED IV START 01/11/2016 Corrie Mckusick, DO [facility]INTER[facility]   IR IMAGING GUIDED PORT INSERTION  07/05/2017   IR REMOVAL TUN CV CATH W/O FL  08/12/2019   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   KNEE ARTHROSCOPY Bilateral    MASTECTOMY W/ SENTINEL NODE BIOPSY Right 11/20/2017   MASTECTOMY W/ SENTINEL NODE BIOPSY Right 11/20/2017   Procedure: RIGHT MASTECTOMY WITH SENTINEL LYMPH NODE BIOPSY;  Surgeon: Coralie Keens, MD;  Location: [place];  Service: General;  Laterality: Right;   PERIPHERAL  VASCULAR BALLOON ANGIOPLASTY  02/17/2020   Procedure: PERIPHERAL VASCULAR BALLOON ANGIOPLASTY;  Surgeon: Serafina Mitchell, MD;  Location: [place] CV LAB;  Service: Cardiovascular;;   PERIPHERAL VASCULAR CATHETERIZATION N/A 08/31/2015   Procedure: A/V Shuntogram;  Surgeon: Serafina Mitchell, MD;  Location: [place] CV LAB;  Service: Cardiovascular;  Laterality: N/A;   PERIPHERAL VASCULAR CATHETERIZATION Left 08/31/2015   Procedure: Peripheral Vascular Balloon Angioplasty;  Surgeon: Serafina Mitchell, MD;  Location: [place] CV LAB;  Service: Cardiovascular;  Laterality: Left;  arm fistula   PERIPHERAL VASCULAR INTERVENTION Right 04/22/2019   Procedure: PERIPHERAL VASCULAR INTERVENTION;  Surgeon: Serafina Mitchell, MD;  Location: [place] CV LAB;  Service: Cardiovascular;  Laterality: Right;  FISTULA   PORT-A-CATH REMOVAL N/A 08/12/2018   Procedure: PORT-A-CATH REMOVAL;  Surgeon: Coralie Keens, MD;  Location: [place];  Service: General;  Laterality: N/A;   [place] Right 12/24/2017   Procedure: UPPER EXTREMITY VENOGRAPHY CENTRAL VENOGRAM;  Surgeon: Waynetta Sandy, MD;  Location: [place] CV LAB;  Service: Cardiovascular;  Laterality: Right;   UPPER EXTREMITY VENOGRAPHY Bilateral 09/02/2018   Procedure: UPPER EXTREMITY VENOGRAPHY;  Surgeon: Waynetta Sandy, MD;  Location: [place] CV LAB;  Service: Cardiovascular;  Laterality: Bilateral;    Assessment & Plan Clinical Impression: Patient is a 65 y.o. year old female with history of diastolic congestive heart failure, colitis, atrial fibrillation, end-stage renal disease on hemodialysis Monday Wednesday Friday [facility] facility, hypertension, right breast cancer 2019.  Per chart review lives with spouse.  Two-level home half bath on main level.  2 steps to entry.  Independent prior to admission and driving.  Presented 08/02/2020 with a fall 3 weeks ago and noted  progressive headaches.  She did see her PCP CT of the head 07/15/2020 negative.  Cranial CT scan 08/02/2020 showed a large left subdural hematoma noted resulting in a 4 mm left-to-right midline shift.  Small left tentorial subdural hematoma also noted as well as a smaller right subdural hematoma.  Underwent left parietal craniotomy for evacuation of subdural hematoma 08/02/2020 per Dr. Sherley Bounds.  Maintained on Keppra for seizure prophylaxis.  Decadron protocol as directed.  Hemodialysis ongoing as per renal services.  Monitoring of blood pressure with history of hypotension maintained on ProAmatine.  Tolerating a regular consistency diet.   Patient transferred to CIR on 08/07/2020 .    Patient currently requires mod with basic self-care skills and IADL secondary to muscle weakness, decreased cardiorespiratoy endurance, and decreased standing balance.  Prior to hospitalization, patient could complete ADL with independent .  Patient will benefit from skilled intervention to decrease level of assist with basic self-care skills, increase independence with basic self-care skills, and increase level of independence with iADL prior to discharge home with care partner.  Anticipate patient will require intermittent supervision and follow up outpatient.  OT - End  of Session Activity Tolerance: Tolerates 10 - 20 min activity with multiple rests Endurance Deficit: Yes Endurance Deficit Description: fatigue limiting adl OT Assessment OT Patient demonstrates impairments in the following area(s): Balance;Endurance;Motor OT Basic ADL's Functional Problem(s): Grooming;Bathing;Dressing;Toileting OT Advanced ADL's Functional Problem(s): Simple Meal Preparation OT Transfers Functional Problem(s): Toilet;Tub/Shower OT Plan OT Intensity: Minimum of 1-2 x/day, 45 to 90 minutes OT Frequency: 5 out of 7 days OT Duration/Estimated Length of Stay: 10-14 days OT Treatment/Interventions: Balance/vestibular  training;Neuromuscular re-education;Self Care/advanced ADL retraining;DME/adaptive equipment instruction;UE/LE Strength taining/ROM;Community reintegration;Patient/family education;UE/LE Coordination activities;Discharge planning;Functional mobility training;Therapeutic Activities OT Self Feeding Anticipated Outcome(s): independent OT Basic Self-Care Anticipated Outcome(s): mod I OT Toileting Anticipated Outcome(s): mod I OT Bathroom Transfers Anticipated Outcome(s): mod I/ supervision OT Recommendation Patient destination: Home Follow Up Recommendations: Outpatient OT Equipment Recommended: To be determined Equipment Details: owns shower seat and shower bench, assess need for commode/RTS   OT Evaluation Precautions/Restrictions  Precautions Precautions: Fall Precaution Comments: R UE fistula Restrictions Weight Bearing Restrictions: No General   Vital Signs Therapy Vitals Temp: 98.4 F (36.9 C) Temp Source: Oral Pulse Rate: 92 Resp: 20 BP: (!) 146/76 Patient Position (if appropriate): Lying Oxygen Therapy SpO2: 100 % O2 Device: Room Air Pain Pain Assessment Pain Scale: 0-10 Pain Score: 3  Pain Type: Acute pain Pain Location: Head Pain Orientation: Anterior Pain Descriptors / Indicators: Headache Pain Frequency: Constant Pain Onset: On-going Pain Intervention(s): Repositioned Home Living/Prior Functioning Home Living Family/patient expects to be discharged to:: Private residence Living Arrangements: Spouse/significant other Available Help at Discharge: Family, Available PRN/intermittently Type of Home: House Home Access: Stairs to enter Technical brewer of Steps: 2 Entrance Stairs-Rails: None Home Layout: Two level, 1/2 bath on main level Alternate Level Stairs-Number of Steps: 14 Alternate Level Stairs-Rails: Left Bathroom Shower/Tub: Tub/shower unit, Walk-in shower, Architectural technologist: Standard Additional Comments: electrical lift to transfer  spouse, son and grandchildren helping with spouse and pt hopes will assist pt and her husband at d/c  Lives With: Spouse Prior Function Level of Independence: Independent with basic ADLs, Independent with gait, Independent with homemaking with ambulation, Independent with transfers  Able to Take Stairs?: Reciprically Driving: Yes Vocation: Retired Comments: drives to HD MWF Vision Baseline Vision/History: Wears glasses Wears Glasses: At all times (not present for evaluation) Patient Visual Report: No change from baseline Vision Assessment?: Yes Eye Alignment: Within Functional Limits Ocular Range of Motion: Within Functional Limits Alignment/Gaze Preference: Within Defined Limits Tracking/Visual Pursuits: Able to track stimulus in all quads without difficulty Saccades: Within functional limits Convergence: Within functional limits Visual Fields: Left visual field deficit (mild edema on left side of face/mild droop to eye may be limiting visual field) Perception  Perception: Within Functional Limits Praxis Praxis: Intact Cognition Overall Cognitive Status: Within Functional Limits for tasks assessed Arousal/Alertness: Awake/alert Orientation Level: Person;Place;Situation Person: Oriented Place: Oriented Situation: Oriented Year: 2022 Month: July Day of Week: Correct Memory: Appears intact Memory Impairment: Retrieval deficit Immediate Memory Recall: Sock;Blue;Bed Memory Recall Sock: Without Cue Memory Recall Blue: Without Cue Memory Recall Bed: Without Cue Attention: Sustained Sustained Attention: Impaired Sustained Attention Impairment: Functional complex Awareness: Appears intact Problem Solving: Appears intact Problem Solving Impairment: Functional complex Safety/Judgment: Appears intact Comments: suspect performance on today's evaluation was heavily influenced by fatigue Sensation Sensation Light Touch: Appears Intact Proprioception: Appears  Intact Coordination Gross Motor Movements are Fluid and Coordinated: No Fine Motor Movements are Fluid and Coordinated: Yes Coordination and Movement Description: slow and deliberate movements, decreased postural control Finger Nose Finger Test:  WFL Motor  Motor Motor: Abnormal postural alignment and control  Trunk/Postural Assessment  Postural Control Postural Control: Within Functional Limits  Balance Balance Balance Assessed: Yes Dynamic Sitting Balance Dynamic Sitting - Level of Assistance: 5: Stand by assistance Static Standing Balance Static Standing - Level of Assistance: 5: Stand by assistance Dynamic Standing Balance Dynamic Standing - Level of Assistance: 4: Min assist Extremity/Trunk Assessment RUE Assessment RUE Assessment: Within Functional Limits LUE Assessment LUE Assessment: Within Functional Limits  Care Tool Care Tool Self Care Eating   Eating Assist Level: Set up assist    Oral Care    Oral Care Assist Level: Set up assist    Bathing   Body parts bathed by patient: Right arm;Left arm;Chest;Abdomen;Front perineal area;Buttocks;Right upper leg;Left upper leg;Face Body parts bathed by helper: Right lower leg;Left lower leg;Buttocks   Assist Level: Minimal Assistance - Patient > 75%    Upper Body Dressing(including orthotics)   What is the patient wearing?: Pull over shirt   Assist Level: Minimal Assistance - Patient > 75%    Lower Body Dressing (excluding footwear)   What is the patient wearing?: Pants;Incontinence brief Assist for lower body dressing: Max A Assistance - Patient 25-49%    Putting on/Taking off footwear   What is the patient wearing?: Non-skid slipper socks Assist for footwear: Total Assistance - Patient < 25%       Care Tool Toileting Toileting activity   Assist for toileting: Moderate Assistance - Patient 50 - 74%     Care Tool Bed Mobility Roll left and right activity   Roll left and right assist level:  Supervision/Verbal cueing    Sit to lying activity   Sit to lying assist level: Contact Guard/Touching assist    Lying to sitting edge of bed activity   Lying to sitting edge of bed assist level: Minimal Assistance - Patient > 75%     Care Tool Transfers Sit to stand transfer        Chair/bed transfer         Toilet transfer   Assist Level: Minimal Assistance - Patient > 75%     Care Tool Cognition Expression of Ideas and Wants Expression of Ideas and Wants: Without difficulty (complex and basic) - expresses complex messages without difficulty and with speech that is clear and easy to understand   Understanding Verbal and Non-Verbal Content Understanding Verbal and Non-Verbal Content: Understands (complex and basic) - clear comprehension without cues or repetitions   Memory/Recall Ability *first 3 days only Memory/Recall Ability *first 3 days only: Current season;Location of own room;That he or she is in a hospital/hospital unit    Refer to Care Plan for [place] 1 OT Short Term Goal 1 (Week 1): patient will complete functional transfers to/from bed, w/c and toilet with CGA OT Short Term Goal 2 (Week 1): patient will complete bathing and dressing with set up/min A using ADs as needed OT Short Term Goal 3 (Week 1): patient will complete basic meal prep/reach and transport of items with CGA  Recommendations for other services: None    Skilled Therapeutic Intervention  Patient seated in recliner, alert and ready for therapy although she c/o fatigue.  She notes mild HA that is improving.  Reviewed role of OT and plan for this session.  Evaluation completed as documented above - she presents with general endurance deficit and balance impairment limiting safety and independence with ADL and mobility.  She is oriented and  able to follow directions without difficulty.  Reviewed goals for therapy and plan of care.  She participated in adl and mobility  training as documented below - assistance needed due to fatigue and balance impairment.  She returned to bed at close of session, bed alarm set and call bell in hand.     ADL ADL Eating: Set up Where Assessed-Eating: Chair Grooming: Setup Where Assessed-Grooming: Sitting at sink Upper Body Bathing: Setup Where Assessed-Upper Body Bathing: Sitting at sink Lower Body Bathing: Minimal assistance Where Assessed-Lower Body Bathing: Sitting at sink;Standing at sink Upper Body Dressing: Minimal assistance Where Assessed-Upper Body Dressing: Chair Lower Body Dressing: Maximal assistance Where Assessed-Lower Body Dressing: Chair Toileting: Moderate assistance Where Assessed-Toileting: Glass blower/designer: Psychiatric nurse Method: Counselling psychologist: Grab bars;Raised toilet seat ADL Comments: not cleared to shower Mobility  Bed Mobility Bed Mobility: Rolling Right;Rolling Left;Supine to Sit;Sit to Supine (All bed mobility performed with HOB >30 deg per MD orders) Rolling Right: Supervision/verbal cueing Rolling Left: Supervision/Verbal cueing Supine to Sit: Supervision/Verbal cueing Sit to Supine: Supervision/Verbal cueing Transfers Sit to Stand: Minimal Assistance - Patient > 75% Stand to Sit: Minimal Assistance - Patient > 75%   Discharge Criteria: Patient will be discharged from OT if patient refuses treatment 3 consecutive times without medical reason, if treatment goals not met, if there is a change in medical status, if patient makes no progress towards goals or if patient is discharged from hospital.  The above assessment, treatment plan, treatment alternatives and goals were discussed and mutually agreed upon: by patient  Carlos Levering 08/08/2020, 4:44 PM

## 2020-08-08 NOTE — Progress Notes (Signed)
Inpatient Rehabilitation Medication Review by a Pharmacist  A complete drug regimen review was completed for this patient to identify any potential clinically significant medication issues.  Clinically significant medication issues were identified:  yes   Type of Medication Issue Identified Description of Issue Urgent (address now) Non-Urgent (address on AM team rounds) Plan Plan Accepted by Provider? (Yes / No / Pending AM Rounds)  Drug Interaction(s) (clinically significant)       Duplicate Therapy       Allergy       No Medication Administration End Date       Incorrect Dose       Additional Drug Therapy Needed  Pt was on simvastatin 20mg  PTA. This was not resumed while inpatient Non-urgent Secure chat sent to Dr. Letta Pate, recommend resuming as appropriate Pending response  Other         Name of provider notified for  issues identified: Dr. Letta Pate  Provider Method of Notification: Secure Chat  Time spent performing this drug regimen review (minutes):  15 minutes  Thank you for involving pharmacy in this patient's care.  Elita Quick, PharmD PGY1 Ambulatory Care Pharmacy Resident 08/08/2020 1:30 PM  **Pharmacist phone directory can be found on [email or web address] listed under [place]**

## 2020-08-09 ENCOUNTER — Encounter (HOSPITAL_COMMUNITY): Payer: Self-pay | Admitting: Physical Medicine & Rehabilitation

## 2020-08-09 ENCOUNTER — Inpatient Hospital Stay (HOSPITAL_COMMUNITY): Payer: Medicare Other

## 2020-08-09 DIAGNOSIS — R079 Chest pain, unspecified: Secondary | ICD-10-CM

## 2020-08-09 DIAGNOSIS — I10 Essential (primary) hypertension: Secondary | ICD-10-CM

## 2020-08-09 DIAGNOSIS — S065X0D Traumatic subdural hemorrhage without loss of consciousness, subsequent encounter: Secondary | ICD-10-CM | POA: Diagnosis not present

## 2020-08-09 LAB — RENAL FUNCTION PANEL
Albumin: 2.4 g/dL — ABNORMAL LOW (ref 3.5–5.0)
Anion gap: 12 (ref 5–15)
BUN: 44 mg/dL — ABNORMAL HIGH (ref 8–23)
CO2: 28 mmol/L (ref 22–32)
Calcium: 8.4 mg/dL — ABNORMAL LOW (ref 8.9–10.3)
Chloride: 93 mmol/L — ABNORMAL LOW (ref 98–111)
Creatinine, Ser: 8.73 mg/dL — ABNORMAL HIGH (ref 0.44–1.00)
GFR, Estimated: 5 mL/min — ABNORMAL LOW (ref >60)
Glucose, Bld: 123 mg/dL — ABNORMAL HIGH (ref 70–99)
Phosphorus: 4.6 mg/dL (ref 2.5–4.6)
Potassium: 3.5 mmol/L (ref 3.5–5.1)
Sodium: 133 mmol/L — ABNORMAL LOW (ref 135–145)

## 2020-08-09 LAB — CBC
HCT: 26.5 % — ABNORMAL LOW (ref 36.0–46.0)
Hemoglobin: 8.8 g/dL — ABNORMAL LOW (ref 12.0–15.0)
MCH: 32.8 pg (ref 26.0–34.0)
MCHC: 33.2 g/dL (ref 30.0–36.0)
MCV: 98.9 fL (ref 80.0–100.0)
Platelets: 158 10*3/uL (ref 150–400)
RBC: 2.68 MIL/uL — ABNORMAL LOW (ref 3.87–5.11)
RDW: 13.7 % (ref 11.5–15.5)
WBC: 6.1 10*3/uL (ref 4.0–10.5)
nRBC: 0 % (ref 0.0–0.2)

## 2020-08-09 LAB — ECHOCARDIOGRAM COMPLETE
Area-P 1/2: 5.62 cm2
Calc EF: 61 %
Height: 61 in
S' Lateral: 2.6 cm
Single Plane A2C EF: 57 %
Single Plane A4C EF: 63.5 %
Weight: 3188.73 oz

## 2020-08-09 LAB — TROPONIN I (HIGH SENSITIVITY): Troponin I (High Sensitivity): 15 ng/L (ref <18)

## 2020-08-09 MED ORDER — DARBEPOETIN ALFA 40 MCG/0.4ML IJ SOSY
PREFILLED_SYRINGE | INTRAMUSCULAR | Status: AC
  Administered 2020-08-09: 40 ug via INTRAVENOUS
  Filled 2020-08-09: qty 0.4

## 2020-08-09 MED ORDER — LIDOCAINE-PRILOCAINE 2.5-2.5 % EX CREA: 1.0000 "application " | TOPICAL_CREAM | CUTANEOUS | Status: DC | PRN

## 2020-08-09 MED ORDER — DILTIAZEM HCL 60 MG PO TABS
30.0000 mg | ORAL_TABLET | ORAL | Status: AC
  Administered 2020-08-09: 30 mg via ORAL

## 2020-08-09 MED ORDER — HEPARIN SODIUM (PORCINE) 1000 UNIT/ML DIALYSIS: 1000.0000 [IU] | INTRAMUSCULAR | Status: DC | PRN

## 2020-08-09 MED ORDER — SODIUM CHLORIDE 0.9 % IV SOLN: 100.0000 mL | INTRAVENOUS | Status: DC | PRN

## 2020-08-09 MED ORDER — PANTOPRAZOLE SODIUM 40 MG PO TBEC
40.0000 mg | DELAYED_RELEASE_TABLET | Freq: Two times a day (BID) | ORAL | Status: DC
  Administered 2020-08-09 – 2020-08-20 (×23): 40 mg via ORAL
  Filled 2020-08-09 (×23): qty 1

## 2020-08-09 MED ORDER — ALTEPLASE 2 MG IJ SOLR: 2.0000 mg | Freq: Once | INTRAMUSCULAR | Status: DC | PRN

## 2020-08-09 MED ORDER — PENTAFLUOROPROP-TETRAFLUOROETH EX AERO: 1.0000 "application " | INHALATION_SPRAY | CUTANEOUS | Status: DC | PRN

## 2020-08-09 MED ORDER — CALCIUM CARBONATE ANTACID 500 MG PO CHEW
400.0000 mg | CHEWABLE_TABLET | Freq: Every day | ORAL | Status: AC
  Filled 2020-08-09 (×2): qty 2

## 2020-08-09 MED ORDER — DARBEPOETIN ALFA 40 MCG/0.4ML IJ SOSY
40.0000 ug | PREFILLED_SYRINGE | INTRAMUSCULAR | Status: DC
  Filled 2020-08-09: qty 0.4

## 2020-08-09 MED ORDER — ATORVASTATIN CALCIUM 10 MG PO TABS
10.0000 mg | ORAL_TABLET | Freq: Every evening | ORAL | Status: DC
  Administered 2020-08-10 – 2020-08-19 (×10): 10 mg via ORAL
  Filled 2020-08-09 (×10): qty 1

## 2020-08-09 MED ORDER — LIDOCAINE HCL (PF) 1 % IJ SOLN: 5.0000 mL | INTRAMUSCULAR | Status: DC | PRN

## 2020-08-09 MED ORDER — NITROGLYCERIN 0.4 MG SL SUBL: 0.4000 mg | SUBLINGUAL_TABLET | SUBLINGUAL | Status: DC | PRN

## 2020-08-09 MED ORDER — CALCIUM CARBONATE ANTACID 500 MG PO CHEW
1.0000 | CHEWABLE_TABLET | Freq: Four times a day (QID) | ORAL | Status: DC | PRN
  Administered 2020-08-09: 200 mg via ORAL

## 2020-08-09 NOTE — Plan of Care (Signed)
Patient is a BI not a stroke. This is not an appropriate care plan.

## 2020-08-09 NOTE — Care Management (Signed)
Inpatient [place] Individual Statement of Services  Patient Name:  Karen [name]  Date:  08/09/2020  Welcome to the [facility].  Our goal is to provide you with an individualized program based on your diagnosis and situation, designed to meet your specific needs.  With this comprehensive rehabilitation program, you will be expected to participate in at least 3 hours of rehabilitation therapies Monday-Friday, with modified therapy programming on the weekends.  Your rehabilitation program will include the following services:  Physical Therapy (PT), Occupational Therapy (OT), Speech Therapy (ST), 24 hour per day rehabilitation nursing, Therapeutic Recreaction (TR), Psychology, Neuropsychology, Care Coordinator, Rehabilitation Medicine, [place], and Other  Weekly team conferences will be held on Tuesdays to discuss your progress.  Your Inpatient Rehabilitation Care Coordinator will talk with you frequently to get your input and to update you on team discussions.  Team conferences with you and your family in attendance may also be held.  Expected length of stay: 10-14 days    Overall anticipated outcome: Independent with an Assistive Device  Depending on your progress and recovery, your program may change. Your Inpatient Rehabilitation Care Coordinator will coordinate services and will keep you informed of any changes. Your Inpatient Rehabilitation Care Coordinator's name and contact numbers are listed  below.  The following services may also be recommended but are not provided by the [place] will be made to provide these services after discharge if needed.  Arrangements include referral to agencies that provide these services.  Your insurance has been verified to be:   [facility] Medicare  Your primary doctor is:  Lucianne Lei  Pertinent information will be shared with your doctor and your insurance company.  Inpatient Rehabilitation Care Coordinator:  Cathleen Corti [phone] or ([phone]  Information discussed with and copy given to patient by: Rana Snare, 08/09/2020, 10:05 AM

## 2020-08-09 NOTE — Progress Notes (Addendum)
Called regarding pt having chest pressure and elevated HR. No SOB, BP 120/70  Prior cardiology note from Dr Gwenlyn Found , shows hx of afib/RVR with atypical CP, ruled out in 2019 for PE and MI, last EKG was in NSR Has been having reflux sx and HA post op since crani, on Cardizem for rate control M-W-F Repeat EKG shows Aflutter rate 130 Will give Cardizem, TUMS  (hx ESRD), increase protonix Check troponin Ask for cardiology f/u, not candidate for anticoag due to recent SDH/Crani

## 2020-08-09 NOTE — Progress Notes (Signed)
  Echocardiogram 2D Echocardiogram has been performed.  Bobbye Charleston 08/09/2020, 2:17 PM

## 2020-08-09 NOTE — Progress Notes (Signed)
Occupational Therapy Session Note  Patient Details  Name: Karen [name] MRN: [id number] Date of Birth: [birth year]  Today's Date: 08/09/2020 OT Individual Time: 1100-1158 OT Individual Time Calculation (min): 58 min    Short Term Goals: Week 1:  OT Short Term Goal 1 (Week 1): patient will complete functional transfers to/from bed, w/c and toilet with CGA OT Short Term Goal 2 (Week 1): patient will complete bathing and dressing with set up/min A using ADs as needed OT Short Term Goal 3 (Week 1): patient will complete basic meal prep/reach and transport of items with CGA  Skilled Therapeutic Interventions/Progress Updates:    Patient seated in w/c - finished with prior OT session and requesting to go outside.  She denies pain and states that she is tired and cold.  Donned teds stockings and completed [facility] activities in outdoor seating area - patient reports feeling better when outside.  Returned inside and provided blanket.   She notes increased fatigue and nausea - does not tolerate odor of lunch trays - provided with peppermint essential oil with some relief.   9 hole peg:   L = 35 s, R = 33 s  Box and blocks:   L = 41, R = 47 Visual motor activities - brock string with intermittent suppression noted (eyes fatigued) she is able to see "X" pattern with blinking/attempts to re-focus, marzden ball - no ROM deficit but patient notes increased nausea with both side to side and ant/post.   Sit to stand and ambulation in room from w/c to bed with increased time and CGA.  Sit to supine min a.  HOB to 30, bed alarm set and call bell in hand.  Nursing present as patient requesting tylenol for start of HA.      Therapy Documentation Precautions:  Precautions Precautions: Fall Precaution Comments: R UE fistula Restrictions Weight Bearing Restrictions: No   Therapy/Group: Individual Therapy  Carlos Levering 08/09/2020, 7:33 AM

## 2020-08-09 NOTE — Progress Notes (Addendum)
PROGRESS NOTE   Subjective/Complaints:  Early this morning had an episode of CP and tachycardia. EKG demonstrated A flutter 130. She received cardizem which slowed her HR. She was feeling better when I saw her just after 7am. Still had a little chest pain  ROS: Patient denies fever, rash, sore throat, blurred vision, nausea, vomiting, diarrhea, cough, shortness of breath   joint or back pain, headache, or mood change.   Objective:   No results found. Recent Labs    08/09/20 0457  WBC 6.1  HGB 8.8*  HCT 26.5*  PLT 158   Recent Labs    08/09/20 0457  NA 133*  K 3.5  CL 93*  CO2 28  GLUCOSE 123*  BUN 44*  CREATININE 8.73*  CALCIUM 8.4*    Intake/Output Summary (Last 24 hours) at 08/09/2020 1211 Last data filed at 08/09/2020 1153 Gross per 24 hour  Intake 1056 ml  Output 0 ml  Net 1056 ml        Physical Exam: Vital Signs Blood pressure 112/61, pulse 90, temperature 98.8 F (37.1 C), temperature source Oral, resp. rate 16, height 5\' 1"  (1.549 m), weight 90.4 kg, SpO2 97 %.   Constitutional: No distress . Vital signs reviewed. HEENT: EOMI, oral membranes moist Neck: supple Cardiovascular: RRR without murmur. No JVD. Occ PAC Respiratory/Chest: CTA Bilaterally without wheezes or rales. Normal effort    GI/Abdomen: BS +, non-tender, non-distended Ext: no clubbing, cyanosis, or edema Psych: pleasant and cooperative  Skin: left crani incision CDI Neurologic: motor strength is 5/5 in left 4 to 4+ right deltoid, bicep, tricep, grip, hip flexor, knee extensors, ankle dorsiflexor and plantar flexor Normal sensory and cerebellar. Musculoskeletal: Full range of motion in all 4 extremities. No joint swelling, sternal, left chest pain reproducible with palpation   Assessment/Plan: 1. Functional deficits which require 3+ hours per day of interdisciplinary therapy in a comprehensive inpatient rehab  setting. Physiatrist is providing close team supervision and 24 hour management of active medical problems listed below. Physiatrist and rehab team continue to assess barriers to discharge/monitor patient progress toward functional and medical goals  Care Tool:  Bathing    Body parts bathed by patient: Right arm, Left arm, Chest, Abdomen, Front perineal area, Buttocks, Right upper leg, Left upper leg, Face   Body parts bathed by helper: Right lower leg, Left lower leg, Buttocks     Bathing assist Assist Level: Minimal Assistance - Patient > 75%     Upper Body Dressing/Undressing Upper body dressing   What is the patient wearing?: Pull over shirt    Upper body assist Assist Level: Minimal Assistance - Patient > 75%    Lower Body Dressing/Undressing Lower body dressing      What is the patient wearing?: Pants, Incontinence brief     Lower body assist Assist for lower body dressing: Maximal Assistance - Patient 25 - 49%     Toileting Toileting    Toileting assist Assist for toileting: Moderate Assistance - Patient 50 - 74%     Transfers Chair/bed transfer  Transfers assist     Chair/bed transfer assist level: Minimal Assistance - Patient > 75%     Locomotion  Ambulation   Ambulation assist      Assist level: Minimal Assistance - Patient > 75% Assistive device: Hand held assist Max distance: 25 ft   Walk 10 feet activity   Assist     Assist level: Minimal Assistance - Patient > 75% Assistive device: Hand held assist   Walk 50 feet activity   Assist Walk 50 feet with 2 turns activity did not occur: Safety/medical concerns (limited by lethargy/fatigue)         Walk 150 feet activity   Assist Walk 150 feet activity did not occur: Safety/medical concerns         Walk 10 feet on uneven surface  activity   Assist Walk 10 feet on uneven surfaces activity did not occur: Safety/medical concerns         Wheelchair     Assist      Wheelchair activity did not occur: Safety/medical concerns (limited assessment at eval due to lethargy/fatigue)         Wheelchair 50 feet with 2 turns activity    Assist    Wheelchair 50 feet with 2 turns activity did not occur: Safety/medical concerns       Wheelchair 150 feet activity     Assist  Wheelchair 150 feet activity did not occur: Safety/medical concerns       Blood pressure 112/61, pulse 90, temperature 98.8 F (37.1 C), temperature source Oral, resp. rate 16, height 5\' 1"  (1.549 m), weight 90.4 kg, SpO2 97 %.  Medical Problem List and Plan: 1.  Decreased functional mobility with gait disturbance secondary to traumatic left SDH with 4 mm left to right midline shift as well as small right subdural hematoma status post left parietal craniotomy evacuation of subdural hematoma 08/02/2020.  Tapered off from Decadron, discussed with neurosurgery yesterday              -patient may shower             -ELOS/Goals: 7-10d  -Continue CIR therapies including PT, OT, and SLP  2.  Antithrombotics: -DVT/anticoagulation: SCDs             -antiplatelet therapy: N/A 3. Pain Management/post-traumatic headache: Hydrocodone as needed  -topamax trial 25mg  bid  -tylenol prn 4. Mood: Vita motional support             -antipsychotic agents: N/A 5. Neuropsych: This patient is capable of making decisions on her own behalf. 6. Skin/Wound Care: Routine skin checks 7. Fluids/Electrolytes/Nutrition: encourage PO  I personally reviewed the patient's labs today.   8.  Seizure prophylaxis.  Keppra 250 mg twice daily 9.  Atrial fibrillation/chest pain.  Cardizem 30 mg Monday Wednesday Friday. Adjust per cards  -given addnl cardizem this morning with benefit.   -HR reg, controlled when I saw her at 0700  -a good amount of pain was reproducible with palpation today, GERD too?  -appreciate cardiology assistance! Await further recs 10.  Hypotension.  ProAmatine 20 mg Monday Wednesday  Friday 11.  GERD.  Protonix- needs HOB up 30 deg at all times and upright during meals and 1 hour after  12.  End-stage renal disease/hyperkalemia.  Hemodialysis per renal service.  Continue Lokelma as directed  -HD after therapy day to allow maximal participation 13.  Diastolic congestive heart failure.  Monitor for any signs of fluid overload   Filed Weights   08/07/20 1815 08/07/20 2239 08/09/20 0600  Weight: 93.7 kg 93.7 kg 90.4 kg     14. Anemia: hgb  down to 8.8  -likely acute on chronic. No signs of blood loss  -will check stool for OB  LOS: 2 days A FACE TO FACE EVALUATION WAS PERFORMED  Meredith Staggers 08/09/2020, 12:11 PM

## 2020-08-09 NOTE — Progress Notes (Signed)
Physical Therapy Session Note  Patient Details  Name: Karen [name] MRN: [id number] Date of Birth: 11-23-1955  Today's Date: 08/09/2020 PT Individual Time:  1430-1445  PT Individual Time Calculation (min): 15 min    Short Term Goals: Week 1:  PT Short Term Goal 1 (Week 1): Patient will complete standardized balance assessment. PT Short Term Goal 2 (Week 1): Patient will perform basic transfers with close supervision >75% of the time using LRAD. PT Short Term Goal 3 (Week 1): Patient will ambulate >250 ft using LRAD with CGA. PT Short Term Goal 4 (Week 1): Patient will perform at least 4 steps with 1 rail with min A.  Skilled Therapeutic Interventions/Progress Updates:  Patient supine in bed on entrance to room. Patient holding head and c/o headache pain and pain at surgical site on L scalp. Related 6/ 10 pain currently. When asked, pt states that she has requested pain medication but has heard from no one. Located RN and related pt's pain and request for pain medication. RN relates that pt has had medication within the last 2 hrs but can have something different for pain. When RN asks re: pain level, pt states 4/10 but knows that it will increase when she is at dialysis.   Pt refuses to participate in therapy session relating headache pain and need to be in room for transport to HD. Education provided re: need to mobilize throughout pain in order to better manage pain as well as to maintain and progress mobility.   Missed 53min of skilled therapy session.    Therapy Documentation Precautions:  Precautions Precautions: Fall Precaution Comments: R UE fistula Restrictions Weight Bearing Restrictions: No Pain: Pain Assessment Pain Scale: 0-10 Pain Score: 6  Pain Location: Head Pain Descriptors / Indicators: Headache Pain Intervention(s): Medication (See eMAR) Critical Care Pain Observation Tool (CPOT) Facial Expression: Relaxed, neutral  Therapy/Group: Individual  Therapy  Alger Simons PT, DPT 08/09/2020, 5:36 PM

## 2020-08-09 NOTE — Progress Notes (Signed)
Patient ID: Karen [name], female   DOB: 1955/07/04, 65 y.o.   MRN: [id number]  S: Seen in room. Episode of A flutter earlier in the am. Resolved with Cardizem.  HA better. For dialysis today.   O:BP 112/61 (BP Location: Left Wrist)   Pulse 90   Temp 98.8 F (37.1 C) (Oral)   Resp 16   Ht 5\' 1"  (1.549 m)   Wt 90.4 kg   LMP  (LMP Unknown)   SpO2 97%   BMI 37.66 kg/m   Intake/Output Summary (Last 24 hours) at 08/09/2020 1235 Last data filed at 08/09/2020 1153 Gross per 24 hour  Intake 1056 ml  Output 0 ml  Net 1056 ml    Intake/Output: I/O last 3 completed shifts: In: 1240 [P.O.:1240] Out: 0   Intake/Output this shift:  Total I/O In: 356 [P.O.:356] Out: 0  Weight change: -3.3 kg Gen: NAD, fatigued CVS:RRR Resp: CTA Abd: +Bs, soft, NT/ND Ext: no edema, RUE AVF +T/B  Recent Labs  Lab 08/03/20 0245 08/04/20 1046 08/09/20 0457  NA 136 136 133*  K 6.2* 5.4* 3.5  CL 98 96* 93*  CO2 24 26 28   GLUCOSE 116* 147* 123*  BUN 45* 35* 44*  CREATININE 9.04* 6.83* 8.73*  ALBUMIN  --   --  2.4*  CALCIUM 9.2 8.9 8.4*  PHOS  --   --  4.6    Liver Function Tests: Recent Labs  Lab 08/09/20 0457  ALBUMIN 2.4*   No results for input(s): LIPASE, AMYLASE in the last 168 hours. No results for input(s): AMMONIA in the last 168 hours. CBC: Recent Labs  Lab 08/03/20 0245 08/09/20 0457  WBC 7.2 6.1  HGB 11.0* 8.8*  HCT 33.1* 26.5*  MCV 99.1 98.9  PLT 199 158    Cardiac Enzymes: No results for input(s): CKTOTAL, CKMB, CKMBINDEX, TROPONINI in the last 168 hours. CBG: No results for input(s): GLUCAP in the last 168 hours.  Iron Studies: No results for input(s): IRON, TIBC, TRANSFERRIN, FERRITIN in the last 72 hours. Studies/Results: No results found.  calcium carbonate  400 mg of elemental calcium Oral Daily   diltiazem  30 mg Oral Q M,W,F   levETIRAcetam  250 mg Oral BID   midodrine  20 mg Oral Q M,W,F-HD   multivitamin  1 tablet Oral QHS   pantoprazole  40  mg Oral BID   polyethylene glycol  17 g Oral Daily   senna  1 tablet Oral BID   sevelamer carbonate  2,400 mg Oral TID WC   simvastatin  20 mg Oral q1800   sodium zirconium cyclosilicate  10 g Oral BID   topiramate  25 mg Oral BID    BMET    Component Value Date/Time   NA 133 (L) 08/09/2020 0457   K 3.5 08/09/2020 0457   CL 93 (L) 08/09/2020 0457   CO2 28 08/09/2020 0457   GLUCOSE 123 (H) 08/09/2020 0457   BUN 44 (H) 08/09/2020 0457   CREATININE 8.73 (H) 08/09/2020 0457   CREATININE 7.25 (HH) 08/06/2018 0751   CREATININE 5.71 (H) 02/27/2014 0900   CALCIUM 8.4 (L) 08/09/2020 0457   CALCIUM 9.0 01/28/2014 1454   GFRNONAA 5 (L) 08/09/2020 0457   GFRNONAA 5 (L) 08/06/2018 0751   GFRAA 5 (L) 04/15/2019 1709   GFRAA 6 (L) 08/06/2018 0751   CBC    Component Value Date/Time   WBC 6.1 08/09/2020 0457   RBC 2.68 (L) 08/09/2020 0457   HGB 8.8 (L)  08/09/2020 0457   HGB 9.6 (L) 08/06/2018 0751   HCT 26.5 (L) 08/09/2020 0457   PLT 158 08/09/2020 0457   PLT 176 08/06/2018 0751   MCV 98.9 08/09/2020 0457   MCH 32.8 08/09/2020 0457   MCHC 33.2 08/09/2020 0457   RDW 13.7 08/09/2020 0457   LYMPHSABS 0.7 08/06/2018 0751   MONOABS 0.5 08/06/2018 0751   EOSABS 0.3 08/06/2018 0751   BASOSABS 0.1 08/06/2018 0751      Dialysis Orders: Center: [facility]  on MWF . EDW 86kg HD Bath 2K/2Ca  Time 4 hrs Heparin 4000 units bolus. 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  S/p drain removal and staples placed.  Did note right SDH but was small. Now in CIR.   ESRD - MWF. Back on schedule. Next HD  7/25.  Hypertension/volume  - stable BP. Not to EDW by weights here. Get standing weight next HD.   Anemia  - Hgb 10.4>11 >8.8. Follow trend. Check Fe studies   Metabolic bone disease -  Ca/Phos at goal. Continue with home meds  Nutrition - renal  diet   Lynnda Child PA-C [facility] 08/09/2020,12:35 PM

## 2020-08-09 NOTE — Progress Notes (Signed)
CHG bath provided

## 2020-08-09 NOTE — Progress Notes (Signed)
Inpatient Rehabilitation  Patient information reviewed and entered into eRehab system by Esco Joslyn Virjean Boman, OTR/L.   Information including medical coding, functional ability and quality indicators will be reviewed and updated through discharge.    

## 2020-08-09 NOTE — Progress Notes (Signed)
Physical Therapy TBI Note  Patient Details  Name: Karen [name] MRN: [id number] Date of Birth: 12/08/1955  Today's Date: 08/09/2020 PT Individual Time: 0800-0900 PT Individual Time Calculation (min): 60 min   Short Term Goals: Week 1:  PT Short Term Goal 1 (Week 1): Patient will complete standardized balance assessment. PT Short Term Goal 2 (Week 1): Patient will perform basic transfers with close supervision >75% of the time using LRAD. PT Short Term Goal 3 (Week 1): Patient will ambulate >250 ft using LRAD with CGA. PT Short Term Goal 4 (Week 1): Patient will perform at least 4 steps with 1 rail with min A.  Skilled Therapeutic Interventions/Progress Updates:    Pt seen bedside for session this am/light activity per nursing/awaiting cardiology consult. Pt awake, alert, appropriate. Discussed activity level per above, pt agreeable. Supine therex: Heel slides, hip abd/add, TKEs HR 92, 02 sats 98% Bridging, clamshells, SLRs   Pt transferred supine to sit on edge of bed w/cues and additional time, very slow movement. In sitting  BP 120/73, HR 87  Seated therex: Toe raises, heel raises, LAqS, marching, scapular retraction, overhead bilat UE reach (to 80-90* due to weakness) stand pivot transfer to wc w/min assist from low bed.  At sink, pt washes face, brushes teeth,  w/set up only.  Therapist assists w/combing hair.  stand pivot transfer wc to bed w/min assist.  Sit to supine w/supervision using rails.  Pt set up w/breakfast tray. Pt left in seated position of bed w/rails up x 4,  alarm set, bed in lowest position, and needs in reach.  Therapy Documentation Precautions:  Precautions Precautions: Fall Precaution Comments: R UE fistula Restrictions Weight Bearing Restrictions: No  Agitated Behavior Scale: TBI Observation Details Observation Environment: on/off unit Start of observation period - Date: 08/09/20 Start of observation period - Time: 1100 End of  observation period - Date: 08/09/20 End of observation period - Time: 1200 Agitated Behavior Scale (DO NOT LEAVE BLANKS) Short attention span, easy distractibility, inability to concentrate: Absent Impulsive, impatient, low tolerance for pain or frustration: Absent Uncooperative, resistant to care, demanding: Absent Violent and/or threatening violence toward people or property: Absent Explosive and/or unpredictable anger: Absent Rocking, rubbing, moaning, or other self-stimulating behavior: Absent Pulling at tubes, restraints, etc.: Absent Wandering from treatment areas: Absent Restlessness, pacing, excessive movement: Absent Repetitive behaviors, motor, and/or verbal: Absent Rapid, loud, or excessive talking: Absent Sudden changes of mood: Absent Easily initiated or excessive crying and/or laughter: Absent Self-abusiveness, physical and/or verbal: Absent Agitated behavior scale total score: 14       Therapy/Group: Individual Therapy Callie Fielding, [place] 08/09/2020, 4:27 PM

## 2020-08-09 NOTE — Progress Notes (Signed)
Pt greeted up in bed smiling and eating crackers this am. Reports that feeling much better. No c/o of chest pain, dizziness, or difficulty in breathing. All needs met, bed alarm on and functioning with call bell in reach.

## 2020-08-09 NOTE — Progress Notes (Signed)
Patient ID: Karen [name], female   DOB: 1955/11/02, 65 y.o.   MRN: [id number]  Pt not in room due to being at therapy at time of visit. SW will continue to make efforts to complete assessment.   Loralee Pacas, MSW, [place] Office: [phone] Cell: [phone] Fax: [phone]

## 2020-08-09 NOTE — Progress Notes (Signed)
Occupational Therapy TBI Note  Patient Details  Name: Karen [name] MRN: [id number] Date of Birth: 1955/04/28  Today's Date: 08/09/2020 OT Individual Time: 1028-1100 OT Individual Time Calculation (min): 32 min    Short Term Goals: Week 1:  OT Short Term Goal 1 (Week 1): patient will complete functional transfers to/from bed, w/c and toilet with CGA OT Short Term Goal 2 (Week 1): patient will complete bathing and dressing with set up/min A using ADs as needed OT Short Term Goal 3 (Week 1): patient will complete basic meal prep/reach and transport of items with CGA  Skilled Therapeutic Interventions/Progress Updates:    Session attempted to make up any missed minutes as pt has dialysis this afternoon. Pt received supine, agreeable to OT, reporting no pain, RN present mid session to discuss results of EKG. Pt reporting fatigue, but after encouragement agreeable to OOB activities. Sup>sit close spvsn with increased time and min vc's. Sit<>stand mod A and stand step transfer bed>w/c mod A with increased anterior trunk leaning (pt stated she was going to squat pivot but stepped instead). Sitting at sink, pt engaged in grooming tasks set up A-min A (due to fatigue), strengthening/conditioning BUEs reaching upward for extended periods of time to assist braiding/combing her hair for psychosocial wellbeing as pt was frustrated with it being matted. Pt requesting to go outside with next OT session as she has been cold in her room and for psychosocial wellbeing. Handed off pt in hallway to OT for impending session; all immediate needs met.   Therapy Documentation Precautions:  Precautions Precautions: Fall Precaution Comments: R UE fistula Restrictions Weight Bearing Restrictions: No  Pain: Pain Assessment Pain Scale: 0-10 Pain Score: 0-No pain  Agitated Behavior Scale: TBI Observation Details Observation Environment: pt room Start of observation period - Date: 08/09/20 Start of  observation period - Time: 1028 End of observation period - Date: 08/09/20 End of observation period - Time: 1100 Agitated Behavior Scale (DO NOT LEAVE BLANKS) Short attention span, easy distractibility, inability to concentrate: Absent Impulsive, impatient, low tolerance for pain or frustration: Absent Uncooperative, resistant to care, demanding: Absent Violent and/or threatening violence toward people or property: Absent Explosive and/or unpredictable anger: Absent Rocking, rubbing, moaning, or other self-stimulating behavior: Absent Pulling at tubes, restraints, etc.: Absent Wandering from treatment areas: Absent Restlessness, pacing, excessive movement: Absent Repetitive behaviors, motor, and/or verbal: Absent Rapid, loud, or excessive talking: Absent Sudden changes of mood: Absent Easily initiated or excessive crying and/or laughter: Absent Self-abusiveness, physical and/or verbal: Absent Agitated behavior scale total score: 14    Therapy/Group: Individual Therapy  Mellissa Kohut 08/09/2020, 12:35 PM

## 2020-08-09 NOTE — Consult Note (Signed)
Cardiology Consultation:   Patient ID: Karen [name] MRN: [id number]; DOB: 04/28/1955  Admit date: 08/07/2020 Date of Consult: 08/09/2020  PCP:  Lucianne Lei, MD   [facility] Providers Cardiologist:  Quay Burow, MD  Electrophysiologist:  Roderic Palau, NP  {   Patient Profile:   [name] is a 65 y.o. female with a hx PAF not anticoagulated, GERD, breast CA, ESRD on dialysis, CP w/ minimal CAD by cardiac CT 2017, failed transplant in 2016, HTN in the past but not treated for HTN currently,  who is being seen 08/09/2020 for the evaluation of chest pain and atrial flutter at the request of Dr Naaman Plummer.  Prev hx HTN, CHF. Volume mgt w/ HD, so now on midodrine on HD days and low-dose Cardizem on those days also, to prevent Afib.  History of Present Illness:   [name] had fall almost a month ago where she hit her head on her piano after falling from standing on her couch to clear cobwebs.  She did not lose consciousness but was a significant blow.  She reports waxing and waning symptoms of HA and dizziness since the injury.  She did tell the PCT at the HD unit about the fall and did not get heparin with HD that day but she did not remind them and has been getting heparin since.  She mentioned the HA and fall to her PCP who ordered a CT of the brain 07/16/20 and there was no acute abnormality noted.   She was brought in from HD on 07/18 from dialysis with worsening headache. In the ED she was uncomfortable, vital signs stable, however repeat CT scan of the head revealed a new large left subdural hematoma with midline shift and small left tentorial subdural hematoma.  Neurosurgery was consulted and she underwent urgent left parietal craniotomy and evacuation of subdural hematoma on 07/18.  She was maintaining SR when on telemetry. She was admitted to [facility] 07/23.  Early am 07/25, she began complaining to chest pressure. Her HR was noted elevated and an ECG showed  atrial flutter, RVR. Cards asked to see.   [name] was awakened by staff to check vital signs at about 4:30 this morning.  At that time, she is experiencing substernal chest pressure rated 5-6/10.  She is not sure if she has ever had this before.  It was not associated with shortness of breath, nausea, or diaphoresis.  She told the staff about it, and they got an ECG showing atrial flutter with a heart rate of 137.  She got a one-time dose of Cardizem 30 mg p.o., in addition to her normal Monday Wednesday Friday Cardizem 30 mg.  She got Tums as she has a history of GERD as well as Tylenol.  At this time, she is pain-free she does not remember exactly when the pain went away and is not sure exactly which medicine that she received was the most helpful.  Today is her dialysis day, so she is a little short of breath but is generally resting comfortably.  Of note, there is a heart rate of 102 recorded at 4:19 AM, but all other heart rates are less than 100.  Her heart is regular in rate and rhythm, and an ECG confirms that she has converted to sinus rhythm.   Past Medical History:  Diagnosis Date   Anemia    Arthritis    knees   Breast cancer ([place]) 2019   Right Breast Cancer   Cancer (  [place])    Chest pain Jan 2016   low risk Myoview    CHF (congestive heart failure) ([place])    Colitis 12/08/2014   [facility]- focal moderate active colitis   Complication of anesthesia 2005   difficulty remembering for a while and waking up   Constipation    Dysrhythmia    h/o A-Fib   Elevated LFTs 2016   [facility]   ESRD on dialysis [facility]) April 2016   [facility] [place]   Fundic gland polyps of stomach, benign    GERD (gastroesophageal reflux disease)    Heart murmur Nov 2015   Aortic scleosis- no stenosis   Hemodialysis patient [facility])    Hypertension    Shortness of breath dyspnea    with exertion    Past Surgical History:  Procedure Laterality Date   A/V FISTULAGRAM Right 04/22/2019    Procedure: A/V FISTULAGRAM - Right Upper;  Surgeon: Serafina Mitchell, MD;  Location: [place] CV LAB;  Service: Cardiovascular;  Laterality: Right;   A/V FISTULAGRAM N/A 02/17/2020   Procedure: A/V FISTULAGRAM - Right Upper;  Surgeon: Serafina Mitchell, MD;  Location: [place] CV LAB;  Service: Cardiovascular;  Laterality: N/A;   ABDOMINAL HYSTERECTOMY  2005   AV FISTULA PLACEMENT Left 12/09/2013   Procedure: INSERTION OF ARTERIOVENOUS (AV) GORE-TEX GRAFT ARM;  Surgeon: Elam Dutch, MD;  Location: [place];  Service: Vascular;  Laterality: Left;   AV FISTULA PLACEMENT Right 01/01/2018   Procedure: INSERTION OF ARTERIOVENOUS (AV) GORE-TEX GRAFT ARM RIGHT ARM;  Surgeon: Elam Dutch, MD;  Location: [place];  Service: Vascular;  Laterality: Right;   AV FISTULA PLACEMENT Right 09/17/2018   Procedure: 1ST STAGE BASILIC ARTERIOVENOUS (AV) FISTULA CREATION RIGHT ARM;  Surgeon: Waynetta Sandy, MD;  Location: [place];  Service: Vascular;  Laterality: Right;   [place] Right 11/12/2018   Procedure: SECOND STAGE BASILIC VEIN TRANSPOSITION RIGHT ARM;  Surgeon: Waynetta Sandy, MD;  Location: [place];  Service: Vascular;  Laterality: Right;   BREAST BIOPSY  1990's   BUNIONECTOMY Bilateral    CHOLECYSTECTOMY  2005   COLONOSCOPY     CRANIOTOMY Left 08/02/2020   Procedure: CRANIOTOMY HEMATOMA EVACUATION SUBDURAL;  Surgeon: Eustace Moore, MD;  Location: [place];  Service: Neurosurgery;  Laterality: Left;   FISTULOGRAM Right 06/12/2019   Procedure: Fistulogram of right upper arm arteriovenous fistula;  Surgeon: Serafina Mitchell, MD;  Location: [place] OR;  Service: Vascular;  Laterality: Right;   INSERTION OF ILIAC STENT  06/12/2019   Procedure: Insertion Of right basilic vein Stent;  Surgeon: Serafina Mitchell, MD;  Location: [place];  Service: Vascular;;   IR AV DIALY SHUNT INTRO NEEDLE/INTRACATH INITIAL W/PTA/IMG RIGHT Right 03/04/2019   IR GENERIC HISTORICAL  01/11/2016   IR  US GUIDE VASC ACCESS RIGHT 01/11/2016 Corrie Mckusick, DO [facility]-INTERV RAD   IR GENERIC HISTORICAL  01/11/2016   IR RADIOLOGY PERIPHERAL GUIDED IV START 01/11/2016 Corrie Mckusick, DO [facility]INTER[facility]   IR IMAGING GUIDED PORT INSERTION  07/05/2017   IR REMOVAL TUN CV CATH W/O FL  08/12/2019   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   KNEE ARTHROSCOPY Bilateral    MASTECTOMY W/ SENTINEL NODE BIOPSY Right 11/20/2017   MASTECTOMY W/ SENTINEL NODE BIOPSY Right 11/20/2017   Procedure: RIGHT MASTECTOMY WITH SENTINEL LYMPH NODE BIOPSY;  Surgeon: Coralie Keens, MD;  Location: [place];  Service: General;  Laterality: Right;   PERIPHERAL VASCULAR BALLOON  ANGIOPLASTY  02/17/2020   Procedure: PERIPHERAL VASCULAR BALLOON ANGIOPLASTY;  Surgeon: Serafina Mitchell, MD;  Location: [place] CV LAB;  Service: Cardiovascular;;   PERIPHERAL VASCULAR CATHETERIZATION N/A 08/31/2015   Procedure: A/V Shuntogram;  Surgeon: Serafina Mitchell, MD;  Location: [place] CV LAB;  Service: Cardiovascular;  Laterality: N/A;   PERIPHERAL VASCULAR CATHETERIZATION Left 08/31/2015   Procedure: Peripheral Vascular Balloon Angioplasty;  Surgeon: Serafina Mitchell, MD;  Location: [place] CV LAB;  Service: Cardiovascular;  Laterality: Left;  arm fistula   PERIPHERAL VASCULAR INTERVENTION Right 04/22/2019   Procedure: PERIPHERAL VASCULAR INTERVENTION;  Surgeon: Serafina Mitchell, MD;  Location: [place] CV LAB;  Service: Cardiovascular;  Laterality: Right;  FISTULA   PORT-A-CATH REMOVAL N/A 08/12/2018   Procedure: PORT-A-CATH REMOVAL;  Surgeon: Coralie Keens, MD;  Location: [place];  Service: General;  Laterality: N/A;   [place] Right 12/24/2017   Procedure: UPPER EXTREMITY VENOGRAPHY CENTRAL VENOGRAM;  Surgeon: Waynetta Sandy, MD;  Location: [place] CV LAB;  Service: Cardiovascular;  Laterality: Right;   UPPER EXTREMITY VENOGRAPHY Bilateral 09/02/2018   Procedure: UPPER EXTREMITY  VENOGRAPHY;  Surgeon: Waynetta Sandy, MD;  Location: [place] CV LAB;  Service: Cardiovascular;  Laterality: Bilateral;     Home Medications:  Prior to Admission medications   Medication Sig Start Date  acetaminophen (TYLENOL) 500 MG tablet Take 1,000 mg by mouth daily as needed for mild pain, moderate pain or headache.    amoxicillin (AMOXIL) 500 MG capsule Take 2,000 mg by mouth See admin instructions. Take 4 capsules (2000 mg) by mouth one hour prior to dental procedures   clobetasol cream (TEMOVATE) 1.82 % Apply 1 application topically daily as needed (for rash).    diltiazem (CARDIZEM) 30 MG tablet Take 1 tablet (30 mg total) by mouth every Monday, Wednesday, and Friday. 07/02/20  ethyl chloride spray Apply 1 application topically every Monday, Wednesday, and Friday with hemodialysis.   fluticasone (FLONASE) 50 MCG/ACT nasal spray Place 1 spray into both nostrils daily as needed for allergies.   lidocaine-prilocaine (EMLA) cream Apply 1 application topically See admin instructions. Apply topically Monday, Wednesday and Friday before dialysis 05/26/15  midodrine (PROAMATINE) 10 MG tablet Take 20 mg by mouth every Monday, Wednesday, and Friday. Prior to dialysis 10/16/18  multivitamin (RENA-VIT) TABS tablet Take 1 tablet by mouth daily.    ondansetron (ZOFRAN) 8 MG tablet Take 8 mg by mouth every 8 (eight) hours as needed for nausea or vomiting.   pantoprazole (PROTONIX) 40 MG tablet TAKE 1 TABLET (40 MG TOTAL) BY MOUTH DAILY. 03/08/20  prochlorperazine (COMPAZINE) 10 MG tablet Take 10 mg by mouth every 6 (six) hours as needed (Nausea or vomiting). 08/21/18  sevelamer carbonate (RENVELA) 800 MG tablet Take 1,600-2,400 mg by mouth See admin instructions. Take 3 tablets (2400 mg) by mouth with each meal & 2 tablets (1600 mg) by mouth with each snack.   simvastatin (ZOCOR) 20 MG tablet TAKE 1 TABLET BY MOUTH EVERYDAY AT BEDTIME 08/04/20    Inpatient Medications: Scheduled Meds:   calcium carbonate  400 mg of elemental calcium Oral Daily   diltiazem  30 mg Oral Q M,W,F   levETIRAcetam  250 mg Oral BID   midodrine  20 mg Oral Q M,W,F-HD   multivitamin  1 tablet Oral QHS   pantoprazole  40 mg Oral BID   polyethylene glycol  17 g Oral Daily   senna  1 tablet  Oral BID   sevelamer carbonate  2,400 mg Oral TID WC   simvastatin  20 mg Oral q1800   sodium zirconium cyclosilicate  10 g Oral BID   topiramate  25 mg Oral BID   Continuous Infusions:  PRN Meds: acetaminophen **OR** acetaminophen, bisacodyl, calcium carbonate, HYDROcodone-acetaminophen, ondansetron **OR** ondansetron (ZOFRAN) IV, sevelamer carbonate  Allergies:   Not on File  Social History:   Social History   Socioeconomic History   Marital status: Married    Spouse name: Not on file   Number of children: 2   Years of education: Not on file   Highest education level: Not on file  Occupational History   Occupation: [place] OFFICE    Employer: [employer]  Tobacco Use   Smoking status: Never   Smokeless tobacco: Never  Vaping Use   Vaping Use: Never used  Substance and Sexual Activity   Alcohol use: No   Drug use: No   Sexual activity: Not on file    Comment: Hysterectomy  Other Topics Concern   Not on file  Social History Narrative   Not on file   Social Determinants of Health   Financial Resource Strain: Not on file  Food Insecurity: Not on file  Transportation Needs: Not on file  Physical Activity: Not on file  Stress: Not on file  Social Connections: Not on file  Intimate Partner Violence: Not on file    Family History:    Family History  Problem Relation Age of Onset   Kidney disease Mother    Heart attack Father    Kidney disease Father    Diabetes Sister    Hyperlipidemia Sister    Hypertension Sister    Kidney disease Sister        x2     ROS:  Please see the history of present illness.  All other ROS reviewed and negative.     Physical Exam/Data:    Vitals:   08/08/20 2021 08/09/20 0359 08/09/20 0600 08/09/20 0626  BP: 111/67 110/77  112/61  Pulse: 87   90  Resp: 16 16  16   Temp: 98.3 F (36.8 C) 98.8 F (37.1 C)    TempSrc: Oral Oral    SpO2: 98% 100%  97%  Weight:   90.4 kg   Height:        Intake/Output Summary (Last 24 hours) at 08/09/2020 1059 Last data filed at 08/09/2020 0744 Gross per 24 hour  Intake 1056 ml  Output 0 ml  Net 1056 ml   Last 3 Weights 08/09/2020 08/07/2020 08/07/2020  Weight (lbs) 199 lb 4.7 oz 206 lb 9.1 oz 206 lb 9.1 oz  Weight (kg) 90.4 kg 93.7 kg 93.7 kg     Body mass index is 37.66 kg/m.  General:  Well nourished, well developed, in no acute distress HEENT: She has poor dentition, and a incision in the left parietal region with no signs of infection, otherwise normal for age Lymph: no adenopathy Neck: Mild JVD Endocrine:  No thryomegaly Vascular: No carotid bruits; 4/4 extremity pulses 2+ bilaterally  Cardiac:  normal S1, S2; RRR; no murmur  Lungs: Some Rales bases bilaterally, no wheezing, rhonchi Abd: soft, nontender, no hepatomegaly  Ext: no edema Musculoskeletal:  No deformities, BUE and BLE strength weak but equal Skin: warm and dry  Neuro:  CNs 2-12 intact, however, memory is poor, no focal abnormalities noted Psych:  Normal affect   EKG:  The EKG was personally reviewed and demonstrates:  ECG at 4:15 AM shows atrial flutter, heart rate 137 ECG at 10:21 AM shows sinus rhythm, heart rate 81, no acute ischemic changes  Telemetry: Not on  Relevant CV Studies:  MONITOR, 3-7 DAYS: 05/19/2019. 1. SR/ST 2. Occasional PVCs 3. Several episodes of SVT at rates up to 180  MYOVIEW: 05/01/2019 The left ventricular ejection fraction is hyperdynamic (>65%). Nuclear stress EF: 72%. Blood pressure demonstrated a normal response to exercise. There was no ST segment deviation noted during stress. This is a low risk study.   Normal resting and stress perfusion. No ischemia or  infarction EF 72%  ECHO: 03/25/2018  1. The left ventricle has normal systolic function with an ejection  fraction of 60-65%. The cavity size was normal. There is mildly increased  left ventricular wall thickness. Left ventricular diastolic parameters  were normal.   2. The right ventricle has normal systolic function. The cavity was  normal. There is no increase in right ventricular wall thickness. Right  ventricular systolic pressure is mildly elevated with an estimated  pressure of 38.5 mmHg.   3. Left atrial size was mildly dilated.   4. The mitral valve is normal in structure.   5. The tricuspid valve is normal in structure. Tricuspid valve  regurgitation is mild-moderate.   6. The aortic valve is tricuspid Mild thickening of the aortic valve.   7. The pulmonic valve was normal in structure.   8. Normal LV function; mild LVH; mild LAE; mild to moderate TR; mildly elevated pulmonary pressure.   CARDIAC CTA: 01/11/2016 Aorta: Normal size. Mild diffuse calcifications in the sinotubular junction. No dissection.   Aortic Valve: Trileaflet. Trivial calcifications in the aortic root.   Coronary Arteries:  Normal coronary origin.  Right dominance.   RCA is a large dominant artery that gives rise to two PDAs and a PLVB. There is no plaque.   Left main is a large and long artery that gives rise to LAD, ramus intermedius and LCX arteries. There is minimal ostial calcified plaque with associated stenosis 0-25%.   LAD is a large vessel that gives rise to two small diagonal branches and has no plaque.   RI is a small artery that has no plaque.   LCX is a small non-dominant artery that has mild calcified plaque in its ostium associated with 25-50% stenosis.   Other findings:   Normal pulmonary vein drainage into the left atrium.   Normal let atrial appendage without a thrombus.   Normal size of the pulmonary artery.   IMPRESSION: 1. Coronary calcium score of 40. This was 85  percentile for age and sex matched control.   2. Normal coronary origin with right dominance.   3. Mild non-obstructive CAD. Risk factor modification is recommended.  Laboratory Data:  High Sensitivity Troponin:  No results for input(s): TROPONINIHS in the last 720 hours.   Chemistry Recent Labs  Lab 08/03/20 0245 08/04/20 1046 08/09/20 0457  NA 136 136 133*  K 6.2* 5.4* 3.5  CL 98 96* 93*  CO2 24 26 28   GLUCOSE 116* 147* 123*  BUN 45* 35* 44*  CREATININE 9.04* 6.83* 8.73*  CALCIUM 9.2 8.9 8.4*  GFRNONAA 4* 6* 5*  ANIONGAP 14 14 12     Recent Labs  Lab 08/09/20 0457  ALBUMIN 2.4*   Hematology Recent Labs  Lab 08/03/20 0245 08/09/20 0457  WBC 7.2 6.1  RBC 3.34* 2.68*  HGB 11.0* 8.8*  HCT 33.1* 26.5*  MCV 99.1 98.9  MCH 32.9 32.8  MCHC 33.2 33.2  RDW 13.3 13.7  PLT 199 158   BNPNo results for input(s): BNP, PROBNP in the last 168 hours.  DDimer No results for input(s): DDIMER in the last 168 hours.   Radiology/Studies:  CT HEAD WO CONTRAST  Result Date: 08/06/2020 CLINICAL DATA:  Follow-up subdural hematoma. EXAM: CT HEAD WITHOUT CONTRAST TECHNIQUE: Contiguous axial images were obtained from the base of the skull through the vertex without intravenous contrast. COMPARISON:  08/03/2020 FINDINGS: Brain: Sequelae of left-sided craniotomy for subdural hematoma evacuation are again identified. The subdural drain has been removed. Pneumocephalus has decreased. A small amount of residual subdural hematoma over the left cerebral convexity and along the left tentorium and posterior falx has slightly decreased. A low-density subdural collection over the right cerebral convexity is unchanged, measuring up to 6 mm in thickness. There is no significant midline shift. No new intracranial hemorrhage, acute infarct, or mass is identified. The ventricles are unchanged in size. Vascular: Calcified atherosclerosis at the skull base. Skull: Left frontal craniotomy with overlying mild  scalp soft tissue swelling and small volume gas. Sinuses/Orbits: Visualized paranasal sinuses and mastoid air cells are clear. Visualized orbits are unremarkable. Other: None. IMPRESSION: 1. Sequelae of left subdural hematoma evacuation with interval drain removal, decreased pneumocephalus, and slightly decreased small volume residual subdural hematoma. No residual midline shift. 2. Unchanged small right-sided subdural hematoma. Electronically Signed   By: Logan Bores M.D.   On: 08/06/2020 18:25     Assessment and Plan:   Paroxysmal atrial flutter -she has spontaneously converted to sinus rhythm -She has a history of atrial fib, and had a few beats of SVT when she wore a monitor last year.  This is not clearly atrial flutter because before and after those few very rapid beats, she was in sinus tach with a heart rate in the 130s. -I suspect that the chest pain was caused by the atrial flutter, but she does not normally have chest pain. -She is not a candidate for anticoagulation due to her subdural hematoma -MD advise if she should have a ZIO applied while she is still in rehab or wear it after she is discharged. -Systolic blood pressure range 118-172 in the last few days. -She may tolerate Cardizem CD 120 mg on on HD days and continue the Cardizem 30 mg on HD days.  (She only takes midodrine on HD days), will discuss with MD  2.  Chest pain, hx mild CAD at cardiac CT 2017 and nl MV 2021 -She had it when she was awakened this morning to get vital signs done -It resolved after getting medications that included Cardizem 30 mg, Tylenol and Tums -It is not clear which medication did the most good -I suspect that the chest pain came either from reflux or from the atrial flutter, both have resolved -MD advise if any further work-up is needed  Otherwise, per Rehab team    Risk Assessment/Risk Scores:     HEAR Score (for undifferentiated chest pain):  HEAR Score: 5   CHA2DS2-VASc Score = 5   This indicates a 7.2% annual risk of stroke. The patient's score is based upon: CHF History: Yes HTN History: Yes Diabetes History: No Stroke History: No Vascular Disease History: Yes Age Score: 1 Gender Score: 1    For questions or updates, please contact [place] Please consult [email or web address] for contact info under    Signed, Rosaria Ferries, PA-C  08/09/2020 10:59 AM

## 2020-08-10 DIAGNOSIS — N186 End stage renal disease: Secondary | ICD-10-CM

## 2020-08-10 DIAGNOSIS — D649 Anemia, unspecified: Secondary | ICD-10-CM

## 2020-08-10 DIAGNOSIS — S065X2S Traumatic subdural hemorrhage with loss of consciousness of 31 minutes to 59 minutes, sequela: Secondary | ICD-10-CM

## 2020-08-10 DIAGNOSIS — G44319 Acute post-traumatic headache, not intractable: Secondary | ICD-10-CM

## 2020-08-10 DIAGNOSIS — Z992 Dependence on renal dialysis: Secondary | ICD-10-CM

## 2020-08-10 LAB — HEMOGLOBIN AND HEMATOCRIT, BLOOD
HCT: 30.3 % — ABNORMAL LOW (ref 36.0–46.0)
Hemoglobin: 9.9 g/dL — ABNORMAL LOW (ref 12.0–15.0)

## 2020-08-10 LAB — IRON AND TIBC
Iron: 55 ug/dL (ref 28–170)
Saturation Ratios: 35 % — ABNORMAL HIGH (ref 10.4–31.8)
TIBC: 155 ug/dL — ABNORMAL LOW (ref 250–450)
UIBC: 100 ug/dL

## 2020-08-10 LAB — OCCULT BLOOD X 1 CARD TO LAB, STOOL: Fecal Occult Bld: POSITIVE — AB

## 2020-08-10 LAB — FERRITIN: Ferritin: 1334 ng/mL — ABNORMAL HIGH (ref 11–307)

## 2020-08-10 MED ORDER — CHLORHEXIDINE GLUCONATE CLOTH 2 % EX PADS: 6.0000 | MEDICATED_PAD | Freq: Every day | CUTANEOUS | Status: DC

## 2020-08-10 NOTE — Progress Notes (Signed)
Occupational Therapy Session Note  Patient Details  Name: Karen [name] MRN: [id number] Date of Birth: 08/11/1955  Today's Date: 08/10/2020 OT Individual Time: 4862-8241 OT Individual Time Calculation (min): 58 min   Short Term Goals: Week 1:  OT Short Term Goal 1 (Week 1): patient will complete functional transfers to/from bed, w/c and toilet with CGA OT Short Term Goal 2 (Week 1): patient will complete bathing and dressing with set up/min A using ADs as needed OT Short Term Goal 3 (Week 1): patient will complete basic meal prep/reach and transport of items with CGA  Skilled Therapeutic Interventions/Progress Updates:    Pt greeted semi-reclined in bed and agreeable to OT treatment session focused on self-care retraining. Pt completed bed mobility with increased time, bed functions, but supervision. Min A sit<>stand w/ RW, then min A to ambulate into bathroom. Pt with extremely slow gait speed. Pt stated she is slow to get moving in the mornings. Pt sat on commode and had small hard BM. Pt able to completed peri-care with supervision. Bathing from shower seat with min A to access LE's and CGA for balance when standing to wash buttocks. Dressing sit<>stand from wc with min A for LB dressing. Pt finished grooming tasks from sitting at the sink. Pt left seated in wc with chair alarm on, call bell in reach and needs met.   Therapy Documentation Precautions:  Precautions Precautions: Fall Precaution Comments: R UE fistula Restrictions Weight Bearing Restrictions: No Pain: Pain Assessment Pain Scale: 0-10 Pain Score:0    Therapy/Group: Individual Therapy  Valma Cava 08/10/2020, 9:37 AM

## 2020-08-10 NOTE — Progress Notes (Signed)
Speech Language Pathology TBI Note  Patient Details  Name: Karen [name] MRN: [id number] Date of Birth: 11/08/1955  Today's Date: 08/10/2020 SLP Individual Time: 0935-1000 SLP Individual Time Calculation (min): 25 min  Short Term Goals: Week 1: SLP Short Term Goal 1 (Week 1): STG=LTG due to ELOS  Skilled Therapeutic Interventions: Skilled treatment session focused on cognitive goals. SLP facilitated session by administering the [place] Status Examination (SLUMS). Patient scored  20/30 points with a score of 27 or above considered normal. Patient demonstrated deficits in processing speed, problem solving and short-term recall. Patient educated on results and verbalized understanding and agreement. Patient left upright in the wheelchair with alarm on and all needs within reach. Continue with current plan of care.   Pain Pain Assessment Pain Score: 0-No pain  Agitated Behavior Scale: TBI Observation Details Observation Environment: Patient's office Start of observation period - Date: 08/10/20 Start of observation period - Time: 0935 End of observation period - Date: 08/10/20 End of observation period - Time: 1000 Agitated Behavior Scale (DO NOT LEAVE BLANKS) Short attention span, easy distractibility, inability to concentrate: Absent Impulsive, impatient, low tolerance for pain or frustration: Absent Uncooperative, resistant to care, demanding: Absent Violent and/or threatening violence toward people or property: Absent Explosive and/or unpredictable anger: Absent Rocking, rubbing, moaning, or other self-stimulating behavior: Absent Pulling at tubes, restraints, etc.: Absent Wandering from treatment areas: Absent Restlessness, pacing, excessive movement: Absent Repetitive behaviors, motor, and/or verbal: Absent Rapid, loud, or excessive talking: Absent Sudden changes of mood: Absent Easily initiated or excessive crying and/or laughter:  Absent Self-abusiveness, physical and/or verbal: Absent Agitated behavior scale total score: 14  Therapy/Group: Individual Therapy  Dracen Reigle, [place] 08/10/2020, 12:22 PM

## 2020-08-10 NOTE — IPOC Note (Signed)
Overall Plan of Care [facility]) Patient Details Name: Kyrstyn Greear MRN: [id number] DOB: 1955-04-09  Admitting Diagnosis: Traumatic subdural hematoma [facility])  Hospital Problems: Principal Problem:   Traumatic subdural hematoma ([place])     Functional Problem List: Nursing Edema, Endurance, Medication Management, Nutrition, Pain, Safety, Skin Integrity  PT Balance, Edema, Sensory, Endurance, Motor, Nutrition, Pain, Skin Integrity  OT Balance, Endurance, Motor  SLP Cognition  TR         Basic ADL's: OT Grooming, Bathing, Dressing, Toileting     Advanced  ADL's: OT Simple Meal Preparation     Transfers: PT Bed Mobility, Bed to Chair, Car, Manufacturing systems engineer, Metallurgist: PT Ambulation, Emergency planning/management officer, Stairs     Additional Impairments: OT    SLP Social Cognition   Problem Solving, Memory, Attention  TR      Anticipated Outcomes Item Anticipated Outcome  Self Feeding independent  Swallowing      Basic self-care  mod I  Toileting  mod I   Bathroom Transfers mod I/ supervision  Bowel/Bladder  N/A  Transfers  mod I using LRAD  Locomotion  mod I household and supervision community mobility using LRAD  Communication     Cognition  mod I  Pain  < 3  Safety/Judgment  Mod I and no falls   Therapy Plan: PT Intensity: Minimum of 1-2 x/day ,45 to 90 minutes PT Frequency: 5 out of 7 days PT Duration Estimated Length of Stay: 10-14 days OT Intensity: Minimum of 1-2 x/day, 45 to 90 minutes OT Frequency: 5 out of 7 days OT Duration/Estimated Length of Stay: 10-14 days SLP Intensity: Minumum of 1-2 x/day, 30 to 90 minutes SLP Frequency: 1 to 3 out of 7 days SLP Duration/Estimated Length of Stay: 7-10 days   Due to the current state of emergency, patients may not be receiving their 3-hours of Medicare-mandated therapy.   Team Interventions: Nursing Interventions Patient/Family Education, Pain Management, Medication Management, Skin  Care/Wound Management, Discharge Planning, Psychosocial Support  PT interventions Ambulation/gait training, Discharge planning, DME/adaptive equipment instruction, Functional mobility training, Pain management, Psychosocial support, Splinting/orthotics, Therapeutic Activities, UE/LE Strength taining/ROM, Visual/perceptual remediation/compensation, Wheelchair propulsion/positioning, UE/LE Coordination activities, Therapeutic Exercise, Stair training, Skin care/wound management, Patient/family education, Neuromuscular re-education, Functional electrical stimulation, Disease management/prevention, Academic librarian, Training and development officer, Cognitive remediation/compensation  OT Interventions Training and development officer, Neuromuscular re-education, Self Care/advanced ADL retraining, DME/adaptive equipment instruction, UE/LE Strength taining/ROM, Academic librarian, Barrister's clerk education, UE/LE Coordination activities, Discharge planning, Functional mobility training, Therapeutic Activities  SLP Interventions Cognitive remediation/compensation, Cueing hierarchy, Internal/external aids, Functional tasks, Patient/family education  TR Interventions    SW/CM Interventions Discharge Planning, Psychosocial Support, Patient/Family Education   Barriers to Discharge MD  Medical stability  Nursing Decreased caregiver support, Home environment access/layout, Wound Care, Lack of/limited family support, Weight, Hemodialysis, Medication compliance, Behavior, Nutrition means Lives with spouse, 2 level home, 1/2 bath on main level. Bed/bath upstairs. 2 steps to enter, no rails. 14 steps with left rail to 2nd level. Spouse is bedridden. Son is intermittent assist only.  PT Inaccessible home environment, Decreased caregiver support, Lack of/limited family support, Home environment access/layout, Hemodialysis pt is primary caregiver for her husband in a 2 level home with bed/bath upstairs, reports she is  recruiting family to assist with her and her husbands care at d/c, will need follow-up to comfirm  OT      SLP      SW       Team Discharge Planning: Destination: PT-Home ,OT-  Home , SLP-Home Projected Follow-up: PT-Home health PT, OT-  Outpatient OT, SLP-Other (comment) (TBD pending progress while inpatient) Projected Equipment Needs: PT-To be determined, OT- To be determined, SLP-None recommended by SLP Equipment Details: PT-Susspect RW at d/c, will determine DME needs based on pt progress, OT-owns shower seat and shower bench, assess need for commode/RTS Patient/family involved in discharge planning: PT- Patient,  OT-Patient, SLP-Patient  MD ELOS: 10-12 days Medical Rehab Prognosis:  Excellent Assessment: The patient has been admitted for CIR therapies with the diagnosis of left traumatic SDH. Course complicated by CV/a-flutter, multiple medical issues. The team will be addressing functional mobility, strength, stamina, balance, safety, adaptive techniques and equipment, self-care, bowel and bladder mgt, patient and caregiver education, NMR, cognition, communication, community reentry. Goals have been set at mod I to occ supervision for mobility, self-care, and mod I for cognition.   Due to the current state of emergency, patients may not be receiving their 3 hours per day of Medicare-mandated therapy.    Meredith Staggers, MD, FAAPMR     See Team Conference Notes for weekly updates to the plan of care

## 2020-08-10 NOTE — Progress Notes (Signed)
PROGRESS NOTE   Subjective/Complaints:  No further chest pain or tachycardia. Up early with OT in shower. Headaches improving  ROS: Patient denies fever, rash, sore throat, blurred vision, nausea, vomiting, diarrhea, cough, shortness of breath or chest pain, joint or back pain, or mood change.   Objective:   ECHOCARDIOGRAM COMPLETE  Result Date: 08/09/2020    ECHOCARDIOGRAM REPORT   Patient Name:   Karen [name] Date of Exam: 08/09/2020 Medical Rec #:  [id number]               Height:       61.0 in Accession #:    [id number]              Weight:       199.3 lb Date of Birth:  06/24/1955                BSA:          1.886 m Patient Age:    65 years                BP:           112/61 mmHg Patient Gender: F                       HR:           68 bpm. Exam Location:  Inpatient Procedure: 2D Echo, 3D Echo, Cardiac Doppler, Color Doppler and Strain Analysis Indications:    R07.9* Chest pain, unspecified  History:        Patient has prior history of Echocardiogram examinations, most                 recent 03/25/2018. Stroke, Signs/Symptoms:Dyspnea and Chest Pain;                 Risk Factors:Dyslipidemia. Subdural hematoma. Breast cancer.  Sonographer:    Roseanna Rainbow RDCS Referring Phys: 13 RHONDA G BARRETT  Sonographer Comments: Global longitudinal strain was attempted. IMPRESSIONS  1. Left ventricular ejection fraction, by estimation, is 55 to 60%. The left ventricle has normal function. The left ventricle has no regional wall motion abnormalities. There is mild left ventricular hypertrophy. Left ventricular diastolic parameters are indeterminate.  2. Right ventricular systolic function is normal. The right ventricular size is mildly enlarged. There is normal pulmonary artery systolic pressure. The estimated right ventricular systolic pressure is 26.2 mmHg.  3. The mitral valve is normal in structure. Trivial mitral valve regurgitation. No  evidence of mitral stenosis.  4. The aortic valve is tricuspid. Aortic valve regurgitation is not visualized. Mild aortic valve sclerosis is present, with no evidence of aortic valve stenosis.  5. The inferior vena cava is normal in size with <50% respiratory variability, suggesting right atrial pressure of 8 mmHg. FINDINGS  Left Ventricle: Left ventricular ejection fraction, by estimation, is 55 to 60%. The left ventricle has normal function. The left ventricle has no regional wall motion abnormalities. The left ventricular internal cavity size was normal in size. There is  mild left ventricular hypertrophy. Left ventricular diastolic parameters are indeterminate. Right Ventricle: The right ventricular size is mildly enlarged. No increase  in right ventricular wall thickness. Right ventricular systolic function is normal. There is normal pulmonary artery systolic pressure. The tricuspid regurgitant velocity is 2.46  m/s, and with an assumed right atrial pressure of 8 mmHg, the estimated right ventricular systolic pressure is 09.6 mmHg. Left Atrium: Left atrial size was normal in size. Right Atrium: Right atrial size was normal in size. Pericardium: There is no evidence of pericardial effusion. Mitral Valve: The mitral valve is normal in structure. There is mild calcification of the mitral valve leaflet(s). Mild mitral annular calcification. Trivial mitral valve regurgitation. No evidence of mitral valve stenosis. Tricuspid Valve: The tricuspid valve is normal in structure. Tricuspid valve regurgitation is mild. Aortic Valve: The aortic valve is tricuspid. Aortic valve regurgitation is not visualized. Mild aortic valve sclerosis is present, with no evidence of aortic valve stenosis. Pulmonic Valve: The pulmonic valve was normal in structure. Pulmonic valve regurgitation is not visualized. Aorta: The aortic root is normal in size and structure. Venous: The inferior vena cava is normal in size with less than 50%  respiratory variability, suggesting right atrial pressure of 8 mmHg. IAS/Shunts: No atrial level shunt detected by color flow Doppler.  LEFT VENTRICLE PLAX 2D LVIDd:         3.50 cm LVIDs:         2.60 cm LV PW:         1.40 cm LV IVS:        1.30 cm LVOT diam:     1.90 cm LV SV:         50 LV SV Index:   26 LVOT Area:     2.84 cm  LV Volumes (MOD) LV vol d, MOD A2C: 72.8 ml LV vol d, MOD A4C: 78.1 ml LV vol s, MOD A2C: 31.3 ml LV vol s, MOD A4C: 28.5 ml LV SV MOD A2C:     41.5 ml LV SV MOD A4C:     78.1 ml LV SV MOD BP:      47.8 ml RIGHT VENTRICLE             IVC RV S prime:     14.80 cm/s  IVC diam: 1.90 cm TAPSE (M-mode): 2.4 cm LEFT ATRIUM             Index       RIGHT ATRIUM           Index LA diam:        3.90 cm 2.07 cm/m  RA Area:     17.20 cm LA Vol (A2C):   26.4 ml 14.00 ml/m RA Volume:   48.90 ml  25.93 ml/m LA Vol (A4C):   39.3 ml 20.84 ml/m LA Biplane Vol: 33.4 ml 17.71 ml/m  AORTIC VALVE LVOT Vmax:   113.00 cm/s LVOT Vmean:  73.900 cm/s LVOT VTI:    0.176 m  AORTA Ao Root diam: 3.10 cm Ao Asc diam:  2.80 cm MITRAL VALVE                TRICUSPID VALVE MV Area (PHT): 5.62 cm     TR Peak grad:   24.2 mmHg MV Decel Time: 135 msec     TR Vmax:        246.00 cm/s MV E velocity: 134.33 cm/s                             SHUNTS  Systemic VTI:  0.18 m                             Systemic Diam: 1.90 cm Loralie Champagne MD Electronically signed by Loralie Champagne MD Signature Date/Time: 08/09/2020/5:26:25 PM    Final    Recent Labs    08/09/20 0457 08/10/20 0507  WBC 6.1  --   HGB 8.8* 9.9*  HCT 26.5* 30.3*  PLT 158  --    Recent Labs    08/09/20 0457  NA 133*  K 3.5  CL 93*  CO2 28  GLUCOSE 123*  BUN 44*  CREATININE 8.73*  CALCIUM 8.4*    Intake/Output Summary (Last 24 hours) at 08/10/2020 1323 Last data filed at 08/10/2020 0900 Gross per 24 hour  Intake 480 ml  Output 1902 ml  Net -1422 ml        Physical Exam: Vital Signs Blood pressure 105/68, pulse  82, temperature 98.8 F (37.1 C), temperature source Oral, resp. rate 16, height 5\' 1"  (1.549 m), weight 89.9 kg, SpO2 100 %.   Constitutional: No distress . Vital signs reviewed. HEENT: EOMI, oral membranes moist Neck: supple Cardiovascular: RRR without murmur. No JVD. Occ PAC Respiratory/Chest: CTA Bilaterally without wheezes or rales. Normal effort    GI/Abdomen: BS +, non-tender, non-distended Ext: no clubbing, cyanosis, or edema Psych: pleasant and cooperative  Skin: left crani incision CDI Neurologic: motor strength is 5/5 in left 4 to 4+ right deltoid, bicep, tricep, grip, hip flexor, knee extensors, ankle dorsiflexor and plantar flexor Normal sensory and cerebellar. Musculoskeletal: Full range of motion in all 4 extremities. No joint swelling, sternal, left chest pain reproducible with palpation   Assessment/Plan: 1. Functional deficits which require 3+ hours per day of interdisciplinary therapy in a comprehensive inpatient rehab setting. Physiatrist is providing close team supervision and 24 hour management of active medical problems listed below. Physiatrist and rehab team continue to assess barriers to discharge/monitor patient progress toward functional and medical goals  Care Tool:  Bathing    Body parts bathed by patient: Right arm, Left arm, Chest, Abdomen, Front perineal area, Buttocks, Right upper leg, Left upper leg, Face   Body parts bathed by helper: Right lower leg, Left lower leg, Buttocks     Bathing assist Assist Level: Minimal Assistance - Patient > 75%     Upper Body Dressing/Undressing Upper body dressing   What is the patient wearing?: Pull over shirt    Upper body assist Assist Level: Minimal Assistance - Patient > 75%    Lower Body Dressing/Undressing Lower body dressing      What is the patient wearing?: Pants, Incontinence brief     Lower body assist Assist for lower body dressing: Maximal Assistance - Patient 25 - 49%      Toileting Toileting    Toileting assist Assist for toileting: Moderate Assistance - Patient 50 - 74%     Transfers Chair/bed transfer  Transfers assist     Chair/bed transfer assist level: Minimal Assistance - Patient > 75%     Locomotion Ambulation   Ambulation assist      Assist level: Minimal Assistance - Patient > 75% Assistive device: Hand held assist Max distance: 25 ft   Walk 10 feet activity   Assist     Assist level: Minimal Assistance - Patient > 75% Assistive device: Hand held assist   Walk 50 feet activity   Assist Walk 50 feet with 2  turns activity did not occur: Safety/medical concerns (limited by lethargy/fatigue)         Walk 150 feet activity   Assist Walk 150 feet activity did not occur: Safety/medical concerns         Walk 10 feet on uneven surface  activity   Assist Walk 10 feet on uneven surfaces activity did not occur: Safety/medical concerns         Wheelchair     Assist Will patient use wheelchair at discharge?: No   Wheelchair activity did not occur: Safety/medical concerns (limited assessment at eval due to lethargy/fatigue)         Wheelchair 50 feet with 2 turns activity    Assist    Wheelchair 50 feet with 2 turns activity did not occur: Safety/medical concerns       Wheelchair 150 feet activity     Assist  Wheelchair 150 feet activity did not occur: Safety/medical concerns       Blood pressure 105/68, pulse 82, temperature 98.8 F (37.1 C), temperature source Oral, resp. rate 16, height 5\' 1"  (1.549 m), weight 89.9 kg, SpO2 100 %.  Medical Problem List and Plan: 1.  Decreased functional mobility with gait disturbance secondary to traumatic left SDH with 4 mm left to right midline shift as well as small right subdural hematoma status post left parietal craniotomy evacuation of subdural hematoma 08/02/2020.  Tapered off from Decadron, discussed with neurosurgery yesterday               -patient may shower             -ELOS/Goals: 7-10d  -Continue CIR therapies including PT, OT, and SLP, team conference today 2.  Antithrombotics: -DVT/anticoagulation: SCDs             -antiplatelet therapy: N/A 3. Pain Management/post-traumatic headache: Hydrocodone as needed  -topamax trial 25mg  bid with benefit  -tylenol prn 4. Mood: [place] motional support             -antipsychotic agents: N/A 5. Neuropsych: This patient is capable of making decisions on her own behalf. 6. Skin/Wound Care: Routine skin checks 7. Fluids/Electrolytes/Nutrition: encourage PO    -intake reasonable 8.  Seizure prophylaxis.  Keppra 250 mg twice daily 9.  Atrial fibrillation/chest pain.  appreciate cards help.   -cardizem adjusted to 120mg  on non-HD days and 30 on HD days  -HR, BP controlled currently  -monitor with therapy activities  10.  Hypotension.  ProAmatine 20 mg Monday Wednesday Friday 11.  GERD.  Protonix- needs HOB up 30 deg at all times and upright during meals and 1 hour after  12.  End-stage renal disease/hyperkalemia.  Hemodialysis per renal service.  Continue Lokelma as directed  -HD after therapy day to allow maximal participation 13.  Diastolic congestive heart failure.  Monitor for any signs of fluid overload   Filed Weights   08/09/20 0600 08/09/20 1543 08/10/20 0514  Weight: 90.4 kg 91.2 kg 89.9 kg     14. Anemia: hgb down to 8.8  -likely acute on chronic. No signs of blood loss  -stool is OB+  -7/26 hgb up to 9.9 however---continue to monitor  LOS: 3 days A FACE TO FACE EVALUATION WAS PERFORMED  Meredith Staggers 08/10/2020, 1:23 PM

## 2020-08-10 NOTE — Patient Care Conference (Signed)
Inpatient RehabilitationTeam Conference and Plan of Care Update Date: 08/10/2020   Time: 10:50 AM    Patient Name: Karen [name]      Medical Record Number: [id number]  Date of Birth: May 22, 1955 Sex: Female         Room/Bed: 4W26C/4W26C-01 Payor Info: Payor: [employer] MEDICARE / Plan: [facility] MEDICARE / Product Type: *No Product type* /    Admit Date/Time:  08/07/2020  6:24 PM  Primary Diagnosis:  Traumatic subdural hematoma [facility])  Hospital Problems: Principal Problem:   Traumatic subdural hematoma ([place]) Active Problems:   ESRD on hemodialysis ([place])   Acute on chronic anemia   Acute post-traumatic headache, not intractable    Expected Discharge Date: Expected Discharge Date: 08/20/20  Team Members Present: Physician leading conference: Dr. Alger Simons Social Worker Present: Loralee Pacas, [place] Nurse Present: Dorthula Nettles, RN PT Present: Tereasa Coop, PT OT Present: Cherylynn Ridges, OT SLP Present: Weston Anna, SLP PPS Coordinator present : Gunnar Fusi, SLP     Current Status/Progress Goal Weekly Team Focus  Bowel/Bladder   cont to bowel and bladder  remain continent  assess q shift   Swallow/Nutrition/ Hydration             ADL's   Min A overall  Mod I/supervision  self-care retraining, transfers, activity tolerance, pt.family ed   Mobility   minA overall, minA ambulation 25' with HHA  mod(I)  transfers, balance, ambulation   Communication             Safety/Cognition/ Behavioral Observations  Min A  Mod I  complex problem solving, recall and attention   Pain   no pain  remain pain free  assess everyshift   Skin   ski intact  remain intact and free of infection  assess q shift     Discharge Planning:  Pt to be assessed. Per EMR, pt needs to be independent at d/c as she is the primary caregiver for her husband who is bed ridden; their son can only provide intermittent support.   Team Discussion: Karen [name] in the past several weeks, left  SDH result. Craniotomy. Continent B/B, incision to crown on head. Topamax for headaches. Patient on target to meet rehab goals: yes, min assist, very slow. Decreased safety awareness. Supervision goals. Mod I goals with PT. Min assist overall. SLP reports working on short term memory and patient talks about being caregiver to bed bound spouse.  *See Care Plan and progress notes for long and short-term goals.   Revisions to Treatment Plan:  MD making adjustments to Cardizem regimen.  Teaching Needs: Family education, medication management, pain management, skin/wound care, transfer training, gait training, balance training, endurance training, safety awareness.  Current Barriers to Discharge: Decreased caregiver support, Medical stability, Home enviroment access/layout, Wound care, Lack of/limited family support, Weight, Hemodialysis, Medication compliance, and Behavior  Possible Resolutions to Barriers: Continue current medications, provide emotional support.     Medical Summary Current Status: left SDH s/p craniotomy, post-traumatic headaches improved with topamax. a fib with rvr--cardizem for rate control. esrd on HD  Barriers to Discharge: Medical stability   Possible Resolutions to [employer] Focus: adjustments to cardizem regimen, cards consult. pain mgt, daily assessment of weights/volume/pt data   Continued Need for Acute Rehabilitation Level of Care: The patient requires daily medical management by a physician with specialized training in physical medicine and rehabilitation for the following reasons: Direction of a multidisciplinary physical rehabilitation program to maximize functional independence : Yes Medical management of patient stability  for increased activity during participation in an intensive rehabilitation regime.: Yes Analysis of laboratory values and/or radiology reports with any subsequent need for medication adjustment and/or medical intervention. : Yes   I  attest that I was present, lead the team conference, and concur with the assessment and plan of the team.   Karen [name] 08/10/2020, 4:18 PM

## 2020-08-10 NOTE — Progress Notes (Addendum)
Progress Note  Patient Name: Karen [name] Date of Encounter: 08/10/2020  [facility] Cardiologist: Quay Burow, MD   Subjective   Denies any further CP.  hsTrop normal at 15.  Had some transient short lived palpitations this am.  Not on tele  Inpatient Medications    Scheduled Meds:  atorvastatin  10 mg Oral QPM   darbepoetin (ARANESP) injection - DIALYSIS  40 mcg Intravenous Q Mon-HD   diltiazem  30 mg Oral Q M,W,F   levETIRAcetam  250 mg Oral BID   midodrine  20 mg Oral Q M,W,F-HD   multivitamin  1 tablet Oral QHS   pantoprazole  40 mg Oral BID   polyethylene glycol  17 g Oral Daily   senna  1 tablet Oral BID   sevelamer carbonate  2,400 mg Oral TID WC   topiramate  25 mg Oral BID   Continuous Infusions:  PRN Meds: acetaminophen **OR** acetaminophen, bisacodyl, calcium carbonate, HYDROcodone-acetaminophen, ondansetron **OR** ondansetron (ZOFRAN) IV, sevelamer carbonate   Vital Signs    Vitals:   08/09/20 1850 08/09/20 1942 08/10/20 0434 08/10/20 0514  BP: 109/76 (!) 115/57 105/68   Pulse: (!) 103 77 82   Resp: 14 17 16    Temp:  98.4 F (36.9 C) 98.8 F (37.1 C)   TempSrc:  Oral Oral   SpO2:  100% 100%   Weight:    89.9 kg  Height:        Intake/Output Summary (Last 24 hours) at 08/10/2020 0819 Last data filed at 08/10/2020 0530 Gross per 24 hour  Intake 240 ml  Output 1902 ml  Net -1662 ml   Last 3 Weights 08/10/2020 08/09/2020 08/09/2020  Weight (lbs) 198 lb 3.1 oz 201 lb 1 oz 199 lb 4.7 oz  Weight (kg) 89.9 kg 91.2 kg 90.4 kg      Telemetry    NSR - Personally Reviewed  ECG    No new EKG to review - Personally Reviewed  Physical Exam   GEN: No acute distress.   Neck: No JVD Cardiac: RRR, no murmurs, rubs, or gallops.  Respiratory: Clear to auscultation bilaterally. GI: Soft, nontender, non-distended  MS: No edema; No deformity. Neuro:  Nonfocal  Psych: Normal affect   Labs    High Sensitivity Troponin:   Recent Labs   Lab 08/09/20 2052  TROPONINIHS 15      Chemistry Recent Labs  Lab 08/04/20 1046 08/09/20 0457  NA 136 133*  K 5.4* 3.5  CL 96* 93*  CO2 26 28  GLUCOSE 147* 123*  BUN 35* 44*  CREATININE 6.83* 8.73*  CALCIUM 8.9 8.4*  ALBUMIN  --  2.4*  GFRNONAA 6* 5*  ANIONGAP 14 12     Hematology Recent Labs  Lab 08/09/20 0457 08/10/20 0507  WBC 6.1  --   RBC 2.68*  --   HGB 8.8* 9.9*  HCT 26.5* 30.3*  MCV 98.9  --   MCH 32.8  --   MCHC 33.2  --   RDW 13.7  --   PLT 158  --     BNPNo results for input(s): BNP, PROBNP in the last 168 hours.   DDimer No results for input(s): DDIMER in the last 168 hours.  CHA2DS2-VASc Score = 5  This indicates a 7.2% annual risk of stroke. The patient's score is based upon: CHF History: Yes HTN History: Yes Diabetes History: No Stroke History: No Vascular Disease History: Yes Age Score: 1 Gender Score: 1   Radiology  ECHOCARDIOGRAM COMPLETE  Result Date: 08/09/2020    ECHOCARDIOGRAM REPORT   Patient Name:   [name] Date of Exam: 08/09/2020 Medical Rec #:  [id number]               Height:       61.0 in Accession #:    [id number]              Weight:       199.3 lb Date of Birth:  1955/10/02                BSA:          1.886 m Patient Age:    3 years                BP:           112/61 mmHg Patient Gender: F                       HR:           68 bpm. Exam Location:  Inpatient Procedure: 2D Echo, 3D Echo, Cardiac Doppler, Color Doppler and Strain Analysis Indications:    R07.9* Chest pain, unspecified  History:        Patient has prior history of Echocardiogram examinations, most                 recent 03/25/2018. Stroke, Signs/Symptoms:Dyspnea and Chest Pain;                 Risk Factors:Dyslipidemia. Subdural hematoma. Breast cancer.  Sonographer:    Roseanna Rainbow RDCS Referring Phys: 22 RHONDA G BARRETT  Sonographer Comments: Global longitudinal strain was attempted. IMPRESSIONS  1. Left ventricular ejection fraction, by  estimation, is 55 to 60%. The left ventricle has normal function. The left ventricle has no regional wall motion abnormalities. There is mild left ventricular hypertrophy. Left ventricular diastolic parameters are indeterminate.  2. Right ventricular systolic function is normal. The right ventricular size is mildly enlarged. There is normal pulmonary artery systolic pressure. The estimated right ventricular systolic pressure is 82.5 mmHg.  3. The mitral valve is normal in structure. Trivial mitral valve regurgitation. No evidence of mitral stenosis.  4. The aortic valve is tricuspid. Aortic valve regurgitation is not visualized. Mild aortic valve sclerosis is present, with no evidence of aortic valve stenosis.  5. The inferior vena cava is normal in size with <50% respiratory variability, suggesting right atrial pressure of 8 mmHg. FINDINGS  Left Ventricle: Left ventricular ejection fraction, by estimation, is 55 to 60%. The left ventricle has normal function. The left ventricle has no regional wall motion abnormalities. The left ventricular internal cavity size was normal in size. There is  mild left ventricular hypertrophy. Left ventricular diastolic parameters are indeterminate. Right Ventricle: The right ventricular size is mildly enlarged. No increase in right ventricular wall thickness. Right ventricular systolic function is normal. There is normal pulmonary artery systolic pressure. The tricuspid regurgitant velocity is 2.46  m/s, and with an assumed right atrial pressure of 8 mmHg, the estimated right ventricular systolic pressure is 05.3 mmHg. Left Atrium: Left atrial size was normal in size. Right Atrium: Right atrial size was normal in size. Pericardium: There is no evidence of pericardial effusion. Mitral Valve: The mitral valve is normal in structure. There is mild calcification of the mitral valve leaflet(s). Mild mitral annular calcification. Trivial mitral valve regurgitation. No evidence of mitral  valve stenosis. Tricuspid Valve: The  tricuspid valve is normal in structure. Tricuspid valve regurgitation is mild. Aortic Valve: The aortic valve is tricuspid. Aortic valve regurgitation is not visualized. Mild aortic valve sclerosis is present, with no evidence of aortic valve stenosis. Pulmonic Valve: The pulmonic valve was normal in structure. Pulmonic valve regurgitation is not visualized. Aorta: The aortic root is normal in size and structure. Venous: The inferior vena cava is normal in size with less than 50% respiratory variability, suggesting right atrial pressure of 8 mmHg. IAS/Shunts: No atrial level shunt detected by color flow Doppler.  LEFT VENTRICLE PLAX 2D LVIDd:         3.50 cm LVIDs:         2.60 cm LV PW:         1.40 cm LV IVS:        1.30 cm LVOT diam:     1.90 cm LV SV:         50 LV SV Index:   26 LVOT Area:     2.84 cm  LV Volumes (MOD) LV vol d, MOD A2C: 72.8 ml LV vol d, MOD A4C: 78.1 ml LV vol s, MOD A2C: 31.3 ml LV vol s, MOD A4C: 28.5 ml LV SV MOD A2C:     41.5 ml LV SV MOD A4C:     78.1 ml LV SV MOD BP:      47.8 ml RIGHT VENTRICLE             IVC RV S prime:     14.80 cm/s  IVC diam: 1.90 cm TAPSE (M-mode): 2.4 cm LEFT ATRIUM             Index       RIGHT ATRIUM           Index LA diam:        3.90 cm 2.07 cm/m  RA Area:     17.20 cm LA Vol (A2C):   26.4 ml 14.00 ml/m RA Volume:   48.90 ml  25.93 ml/m LA Vol (A4C):   39.3 ml 20.84 ml/m LA Biplane Vol: 33.4 ml 17.71 ml/m  AORTIC VALVE LVOT Vmax:   113.00 cm/s LVOT Vmean:  73.900 cm/s LVOT VTI:    0.176 m  AORTA Ao Root diam: 3.10 cm Ao Asc diam:  2.80 cm MITRAL VALVE                TRICUSPID VALVE MV Area (PHT): 5.62 cm     TR Peak grad:   24.2 mmHg MV Decel Time: 135 msec     TR Vmax:        246.00 cm/s MV E velocity: 134.33 cm/s                             SHUNTS                             Systemic VTI:  0.18 m                             Systemic Diam: 1.90 cm Loralie Champagne MD Electronically signed by Loralie Champagne MD  Signature Date/Time: 08/09/2020/5:26:25 PM    Final     Cardiac Studies   2D echo 08/09/2020 IMPRESSIONS    1. Left ventricular ejection fraction, by estimation, is 55 to 60%. The  left ventricle has  normal function. The left ventricle has no regional  wall motion abnormalities. There is mild left ventricular hypertrophy.  Left ventricular diastolic parameters  are indeterminate.   2. Right ventricular systolic function is normal. The right ventricular  size is mildly enlarged. There is normal pulmonary artery systolic  pressure. The estimated right ventricular systolic pressure is 85.8 mmHg.   3. The mitral valve is normal in structure. Trivial mitral valve  regurgitation. No evidence of mitral stenosis.   4. The aortic valve is tricuspid. Aortic valve regurgitation is not  visualized. Mild aortic valve sclerosis is present, with no evidence of  aortic valve stenosis.   5. The inferior vena cava is normal in size with <50% respiratory  variability, suggesting right atrial pressure of 8 mmHg.   Patient Profile     65 y.o. female with a hx PAF not anticoagulated, GERD, breast CA, ESRD on dialysis, CP w/ minimal CAD by cardiac CT 2017, failed transplant in 2016, HTN in the past but not treated for HTN currently,  who is being seen 08/09/2020 for the evaluation of chest pain and atrial flutter at the request of Dr Naaman Plummer.    Assessment & Plan    Paroxysmal atrial flutter -she has spontaneously converted to sinus rhythm -She has a history of atrial fib, and had a few beats of SVT when she wore a monitor last year.   -She may tolerate Cardizem CD 120 mg on on HD days and continue the Cardizem 30 mg on HD days.  (She only takes midodrine on HD days) -had transient palpitations this am -start Cardizem CD 120mg  daily on non HD days -not a candidate for anticoagulation at this time due to subdural bleed (she had not been anticoagulated in the past due to a CHADS2VASC score of 1 but now is 2-3  (female, age > 45) -will arrange followup in afib clinic   2.  Chest pain -hx mild CAD at cardiac CT 2017 and nl MV 2021 -she had an episode yesterday in setting of atrial flutter with RVR and resolved after she converted to NSR -she has a hx of chronic CP in the past and coronary CTA in 2017 with mild disease>>she says she rarely has CP -hsTrop normal and 2D echo with normal LVF -no further inpt ischemic workup -can consider outpt [facility] will sign off.   Medication Recommendations:  Atorvastatin 10mg  daily, Cardizem 30mg  M-W-F with HD, Cardizem CD 120mg  on Tue-Thurs-Sat-Sun (non HD days), Midodrine 20mg  on HD days Other recommendations (labs, testing, etc):  none Follow up as an outpatient:  Afib clinic in 1-2 weeks  For questions or updates, please contact [place] Please consult [email or web address] for contact info under        Signed, Fransico Him, MD  08/10/2020, 8:19 AM

## 2020-08-10 NOTE — Progress Notes (Signed)
Occupational Therapy TBI Note  Patient Details  Name: Karen [name] MRN: [id number] Date of Birth: 03/27/1955  Today's Date: 08/10/2020 OT Individual Time: 1418-1500 OT Individual Time Calculation (min): 42 min    Short Term Goals: Week 1:  OT Short Term Goal 1 (Week 1): patient will complete functional transfers to/from bed, w/c and toilet with CGA OT Short Term Goal 2 (Week 1): patient will complete bathing and dressing with set up/min A using ADs as needed OT Short Term Goal 3 (Week 1): patient will complete basic meal prep/reach and transport of items with CGA  Skilled Therapeutic Interventions/Progress Updates:    Pt received sitting in w/c,  calling for bathroom transfer needs. Pt completed sit > stand and ambulatory transfer into the bathroom with RW with min A- CGA. She required increased effort to void BM- OT cueing to not strain- pt reporting stool is very firm and painful. Encouraged pt to discuss with nursing staff/MD stool softening ideas. Pt also frustrated re meal/food limitations- clarified renal diet restrictions and pt was under assumption "1200" on board was referencing calories per day, clarified this was fluid restriction. 9 hole peg test completed- R: 33.6 seconds. L: 31.6. Remainder of session focused on BUE strengthening/endurance holding a 2kg medicine ball and completing functional reaching exercises. Pt was left sitting up with all needs met, chair alarm set.   Therapy Documentation Precautions:  Precautions Precautions: Fall Precaution Comments: R UE fistula Restrictions Weight Bearing Restrictions: No  Therapy/Group: Individual Therapy  Curtis Sites 08/10/2020, 6:47 AM

## 2020-08-10 NOTE — Progress Notes (Signed)
[facility] Progress Note   Subjective:   Patient seen and examined at bedside.  Reports CP and palpitations this AM, now improved.  Denies SOB, n/v/d, abdominal pain, weakness and fatigue.    Objective Vitals:   08/09/20 1850 08/09/20 1942 08/10/20 0434 08/10/20 0514  BP: 109/76 (!) 115/57 105/68   Pulse: (!) 103 77 82   Resp: 14 17 16    Temp:  98.4 F (36.9 C) 98.8 F (37.1 C)   TempSrc:  Oral Oral   SpO2:  100% 100%   Weight:    89.9 kg  Height:       Physical Exam General:well appearing female in NAD, sitting in wheelchair Heart:RRR, no mrg Lungs:CTAB, nml WOB on RA Abdomen:soft, NTND Extremities:no LE edema Dialysis Access: RU AVF +b/t   Filed Weights   08/09/20 0600 08/09/20 1543 08/10/20 0514  Weight: 90.4 kg 91.2 kg 89.9 kg    Intake/Output Summary (Last 24 hours) at 08/10/2020 1231 Last data filed at 08/10/2020 0900 Gross per 24 hour  Intake 480 ml  Output 1902 ml  Net -1422 ml    Additional Objective Labs: Basic Metabolic Panel: Recent Labs  Lab 08/04/20 1046 08/09/20 0457  NA 136 133*  K 5.4* 3.5  CL 96* 93*  CO2 26 28  GLUCOSE 147* 123*  BUN 35* 44*  CREATININE 6.83* 8.73*  CALCIUM 8.9 8.4*  PHOS  --  4.6   Liver Function Tests: Recent Labs  Lab 08/09/20 0457  ALBUMIN 2.4*    CBC: Recent Labs  Lab 08/09/20 0457 08/10/20 0507  WBC 6.1  --   HGB 8.8* 9.9*  HCT 26.5* 30.3*  MCV 98.9  --   PLT 158  --    Iron Studies:  Recent Labs    08/10/20 0507  IRON 55  TIBC 155*  FERRITIN 1,334*   Lab Results  Component Value Date   INR 1.1 08/02/2020   INR 1.00 07/05/2017   INR 1.16 12/18/2014   Studies/Results: ECHOCARDIOGRAM COMPLETE  Result Date: 08/09/2020    ECHOCARDIOGRAM REPORT   Patient Name:   ANNARAE MACNAIR Stribling Date of Exam: 08/09/2020 Medical Rec #:  [id number]               Height:       61.0 in Accession #:    [id number]              Weight:       199.3 lb Date of Birth:  [birth year]                 BSA:          1.886 m Patient Age:    65 years                BP:           112/61 mmHg Patient Gender: F                       HR:           68 bpm. Exam Location:  Inpatient Procedure: 2D Echo, 3D Echo, Cardiac Doppler, Color Doppler and Strain Analysis Indications:    R07.9* Chest pain, unspecified  History:        Patient has prior history of Echocardiogram examinations, most                 recent 03/25/2018. Stroke, Signs/Symptoms:Dyspnea and Chest Pain;  Risk Factors:Dyslipidemia. Subdural hematoma. Breast cancer.  Sonographer:    Roseanna Rainbow RDCS Referring Phys: 43 RHONDA G BARRETT  Sonographer Comments: Global longitudinal strain was attempted. IMPRESSIONS  1. Left ventricular ejection fraction, by estimation, is 55 to 60%. The left ventricle has normal function. The left ventricle has no regional wall motion abnormalities. There is mild left ventricular hypertrophy. Left ventricular diastolic parameters are indeterminate.  2. Right ventricular systolic function is normal. The right ventricular size is mildly enlarged. There is normal pulmonary artery systolic pressure. The estimated right ventricular systolic pressure is 59.9 mmHg.  3. The mitral valve is normal in structure. Trivial mitral valve regurgitation. No evidence of mitral stenosis.  4. The aortic valve is tricuspid. Aortic valve regurgitation is not visualized. Mild aortic valve sclerosis is present, with no evidence of aortic valve stenosis.  5. The inferior vena cava is normal in size with <50% respiratory variability, suggesting right atrial pressure of 8 mmHg. FINDINGS  Left Ventricle: Left ventricular ejection fraction, by estimation, is 55 to 60%. The left ventricle has normal function. The left ventricle has no regional wall motion abnormalities. The left ventricular internal cavity size was normal in size. There is  mild left ventricular hypertrophy. Left ventricular diastolic parameters are indeterminate. Right Ventricle:  The right ventricular size is mildly enlarged. No increase in right ventricular wall thickness. Right ventricular systolic function is normal. There is normal pulmonary artery systolic pressure. The tricuspid regurgitant velocity is 2.46  m/s, and with an assumed right atrial pressure of 8 mmHg, the estimated right ventricular systolic pressure is 35.7 mmHg. Left Atrium: Left atrial size was normal in size. Right Atrium: Right atrial size was normal in size. Pericardium: There is no evidence of pericardial effusion. Mitral Valve: The mitral valve is normal in structure. There is mild calcification of the mitral valve leaflet(s). Mild mitral annular calcification. Trivial mitral valve regurgitation. No evidence of mitral valve stenosis. Tricuspid Valve: The tricuspid valve is normal in structure. Tricuspid valve regurgitation is mild. Aortic Valve: The aortic valve is tricuspid. Aortic valve regurgitation is not visualized. Mild aortic valve sclerosis is present, with no evidence of aortic valve stenosis. Pulmonic Valve: The pulmonic valve was normal in structure. Pulmonic valve regurgitation is not visualized. Aorta: The aortic root is normal in size and structure. Venous: The inferior vena cava is normal in size with less than 50% respiratory variability, suggesting right atrial pressure of 8 mmHg. IAS/Shunts: No atrial level shunt detected by color flow Doppler.  LEFT VENTRICLE PLAX 2D LVIDd:         3.50 cm LVIDs:         2.60 cm LV PW:         1.40 cm LV IVS:        1.30 cm LVOT diam:     1.90 cm LV SV:         50 LV SV Index:   26 LVOT Area:     2.84 cm  LV Volumes (MOD) LV vol d, MOD A2C: 72.8 ml LV vol d, MOD A4C: 78.1 ml LV vol s, MOD A2C: 31.3 ml LV vol s, MOD A4C: 28.5 ml LV SV MOD A2C:     41.5 ml LV SV MOD A4C:     78.1 ml LV SV MOD BP:      47.8 ml RIGHT VENTRICLE             IVC RV S prime:     14.80 cm/s  IVC  diam: 1.90 cm TAPSE (M-mode): 2.4 cm LEFT ATRIUM             Index       RIGHT ATRIUM            Index LA diam:        3.90 cm 2.07 cm/m  RA Area:     17.20 cm LA Vol (A2C):   26.4 ml 14.00 ml/m RA Volume:   48.90 ml  25.93 ml/m LA Vol (A4C):   39.3 ml 20.84 ml/m LA Biplane Vol: 33.4 ml 17.71 ml/m  AORTIC VALVE LVOT Vmax:   113.00 cm/s LVOT Vmean:  73.900 cm/s LVOT VTI:    0.176 m  AORTA Ao Root diam: 3.10 cm Ao Asc diam:  2.80 cm MITRAL VALVE                TRICUSPID VALVE MV Area (PHT): 5.62 cm     TR Peak grad:   24.2 mmHg MV Decel Time: 135 msec     TR Vmax:        246.00 cm/s MV E velocity: 134.33 cm/s                             SHUNTS                             Systemic VTI:  0.18 m                             Systemic Diam: 1.90 cm Loralie Champagne MD Electronically signed by Loralie Champagne MD Signature Date/Time: 08/09/2020/5:26:25 PM    Final     Medications:   atorvastatin  10 mg Oral QPM   darbepoetin (ARANESP) injection - DIALYSIS  40 mcg Intravenous Q Mon-HD   diltiazem  30 mg Oral Q M,W,F   levETIRAcetam  250 mg Oral BID   midodrine  20 mg Oral Q M,W,F-HD   multivitamin  1 tablet Oral QHS   pantoprazole  40 mg Oral BID   polyethylene glycol  17 g Oral Daily   senna  1 tablet Oral BID   sevelamer carbonate  2,400 mg Oral TID WC   topiramate  25 mg Oral BID    Dialysis Orders: [facility]  on MWF . EDW 86kg HD [place] 2K/2Ca  Time 4 hrs Heparin 4000 units bolus. 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  S/p drain removal and staples placed.  Did note right SDH but was small. Now in CIR.  ESRD - on MWF.  HD tomorrow per regular schedule.   Hypertension/volume  - stable BP. On midodrine 20mg  qHD. Not to EDW by weights here. Get standing weight next HD.  Anemia  - Hgb was falling but improved today to 9.9.  Tsat 35%.  Not on ESA, continue to follow trends.   Metabolic bone disease -  Ca/Phos at goal. Continue with home meds   Nutrition - renal diet w/fluid restrictions.  A fib - seen by cardiology, on cardizem GERD Diastolic congestive HF - fluid managed with dialysis  Jen Mow, PA-C [place] 08/10/2020,12:31 PM  LOS: 3 days

## 2020-08-10 NOTE — Progress Notes (Signed)
Physical Therapy Session Note  Patient Details  Name: Karen [name] MRN: [id number] Date of Birth: July 27, 1955  Today's Date: 08/10/2020 PT Individual Time: 1105-1200 PT Individual Time Calculation (min): 55 min   Short Term Goals: Week 1:  PT Short Term Goal 1 (Week 1): Patient will complete standardized balance assessment. PT Short Term Goal 2 (Week 1): Patient will perform basic transfers with close supervision >75% of the time using LRAD. PT Short Term Goal 3 (Week 1): Patient will ambulate >250 ft using LRAD with CGA. PT Short Term Goal 4 (Week 1): Patient will perform at least 4 steps with 1 rail with min A.  Skilled Therapeutic Interventions/Progress Updates:     Pt received seated in [facility] and agrees to therapy. No complaint of pain and requesting to go outside due to being very cold in her room. WC transport outside for time management and energy conservation. Pt performs gait training over unlevel ground and varying surfaces. PT dons pt's shoes with totalA for time management. Pt ambulates 40' very slowly and minA HHA, ambulating extremely slowly with forward flexed posture and bilateral shortened stride lengths. PT cues for upright gaze to improve posture and balance, increasing gait speed and bilateral stride length and step height to decresak risk for falls. Pt then ambulates same 38' with much improved gait pattern, with PT providing minA but no HHA needed. Following seated rest break, pt ambulates 220' with same assist. WC transport back to room. Left seated in WC with alarm intact and all needs within reach.  Therapy Documentation Precautions:  Precautions Precautions: Fall Precaution Comments: R UE fistula Restrictions Weight Bearing Restrictions: No   Therapy/Group: Individual Therapy  Breck Coons, PT, DPT 08/10/2020, 12:58 PM

## 2020-08-11 DIAGNOSIS — I4892 Unspecified atrial flutter: Secondary | ICD-10-CM

## 2020-08-11 DIAGNOSIS — I2510 Atherosclerotic heart disease of native coronary artery without angina pectoris: Secondary | ICD-10-CM

## 2020-08-11 DIAGNOSIS — R079 Chest pain, unspecified: Secondary | ICD-10-CM

## 2020-08-11 DIAGNOSIS — I2583 Coronary atherosclerosis due to lipid rich plaque: Secondary | ICD-10-CM

## 2020-08-11 DIAGNOSIS — E7800 Pure hypercholesterolemia, unspecified: Secondary | ICD-10-CM

## 2020-08-11 NOTE — Plan of Care (Signed)
Behavioral Plan   [place] Level: VII  Behavior to decrease/ eliminate:   -No unsafe behaviors noted -Maximizing PO intake    Changes to environment:   -All needs within reach -Provide patient with appropriate menu and educate on calling down meal choices in order to discuss options -Possible discussion with physician regarding liberating diet   Interventions:  -Bed Alarm -Belt alarm while in chair  Recommendations for interactions with patient:  -Limit discussions regarding food -Patient is often cold, offer a blanket and close door when leaving room   Attendees:   Weston Anna, SLP Tereasa Coop, PT

## 2020-08-11 NOTE — Progress Notes (Addendum)
Patient ID: Karen [name], female   DOB: [birth year], 65 y.o.   MRN: [id number]  08/10/2020- SW met with pt in room to introduce self, explain role, and discuss discharge process. SW completed assessment. SW discussed support at home for her and her husband. Confirms that their son Karen [name] and his son's mother Karen [name] is helping care for her husband. They have limited support. Pt was driving self to/from dialysis. SW will submit [employer] [place] application for transportation services.   08/11/2020-SW spoke with Greg/Dialysis Coord [phone]) to inform on pt discharge date 8/4.   SW informed medical team pt d/c date will need to change to 8/4 from 8/5 due to MWF dialysis.   SW spoke with pt son Karen [name] [phone]) to discuss above. SW encouraged them to assist pt with dialysis appointments at this time, and SW will submit [place] application for transportation. Son would like to know what her recovery period will be. SW encouraged family education to get an idea on how well pt is doing. Fam edu scheduled for Monday (8/1) 9am-12pm. SW will f/u once there is more information on pt d/c needs.   SW sent [employer] application to [employer] ([email or web address]), and waiting on f/u to determine if pt is accepted.  *Pt is pre-certified for services but outside of the service area for transportation. SW called pt on [place] to discuss above. SW will discuss with pt to f/u with her social worker to see if there are any other transportation resources.   Loralee Pacas, MSW, [place] Office: [phone] Cell: [phone] Fax: [phone]

## 2020-08-11 NOTE — Progress Notes (Signed)
PROGRESS NOTE   Subjective/Complaints:  "Why don't you give me some food?!"  Doesn't like renal diet. Intake inconsistent as a result Headaches improving  ROS: Patient denies fever, rash, sore throat, blurred vision, nausea, vomiting, diarrhea, cough, shortness of breath or chest pain, joint or back pain,  or mood change.   Objective:   ECHOCARDIOGRAM COMPLETE  Result Date: 08/09/2020    ECHOCARDIOGRAM REPORT   Patient Name:   Karen [name] Date of Exam: 08/09/2020 Medical Rec #:  [id number]               Height:       61.0 in Accession #:    [id number]              Weight:       199.3 lb Date of Birth:  Apr 27, 1955                BSA:          1.886 m Patient Age:    65 years                BP:           112/61 mmHg Patient Gender: F                       HR:           68 bpm. Exam Location:  Inpatient Procedure: 2D Echo, 3D Echo, Cardiac Doppler, Color Doppler and Strain Analysis Indications:    R07.9* Chest pain, unspecified  History:        Patient has prior history of Echocardiogram examinations, most                 recent 03/25/2018. Stroke, Signs/Symptoms:Dyspnea and Chest Pain;                 Risk Factors:Dyslipidemia. Subdural hematoma. Breast cancer.  Sonographer:    Roseanna Rainbow RDCS Referring Phys: 6 RHONDA G BARRETT  Sonographer Comments: Global longitudinal strain was attempted. IMPRESSIONS  1. Left ventricular ejection fraction, by estimation, is 55 to 60%. The left ventricle has normal function. The left ventricle has no regional wall motion abnormalities. There is mild left ventricular hypertrophy. Left ventricular diastolic parameters are indeterminate.  2. Right ventricular systolic function is normal. The right ventricular size is mildly enlarged. There is normal pulmonary artery systolic pressure. The estimated right ventricular systolic pressure is 65.7 mmHg.  3. The mitral valve is normal in structure. Trivial mitral  valve regurgitation. No evidence of mitral stenosis.  4. The aortic valve is tricuspid. Aortic valve regurgitation is not visualized. Mild aortic valve sclerosis is present, with no evidence of aortic valve stenosis.  5. The inferior vena cava is normal in size with <50% respiratory variability, suggesting right atrial pressure of 8 mmHg. FINDINGS  Left Ventricle: Left ventricular ejection fraction, by estimation, is 55 to 60%. The left ventricle has normal function. The left ventricle has no regional wall motion abnormalities. The left ventricular internal cavity size was normal in size. There is  mild left ventricular hypertrophy. Left ventricular diastolic parameters are indeterminate. Right Ventricle: The right ventricular  size is mildly enlarged. No increase in right ventricular wall thickness. Right ventricular systolic function is normal. There is normal pulmonary artery systolic pressure. The tricuspid regurgitant velocity is 2.46  m/s, and with an assumed right atrial pressure of 8 mmHg, the estimated right ventricular systolic pressure is 41.9 mmHg. Left Atrium: Left atrial size was normal in size. Right Atrium: Right atrial size was normal in size. Pericardium: There is no evidence of pericardial effusion. Mitral Valve: The mitral valve is normal in structure. There is mild calcification of the mitral valve leaflet(s). Mild mitral annular calcification. Trivial mitral valve regurgitation. No evidence of mitral valve stenosis. Tricuspid Valve: The tricuspid valve is normal in structure. Tricuspid valve regurgitation is mild. Aortic Valve: The aortic valve is tricuspid. Aortic valve regurgitation is not visualized. Mild aortic valve sclerosis is present, with no evidence of aortic valve stenosis. Pulmonic Valve: The pulmonic valve was normal in structure. Pulmonic valve regurgitation is not visualized. Aorta: The aortic root is normal in size and structure. Venous: The inferior vena cava is normal in size  with less than 50% respiratory variability, suggesting right atrial pressure of 8 mmHg. IAS/Shunts: No atrial level shunt detected by color flow Doppler.  LEFT VENTRICLE PLAX 2D LVIDd:         3.50 cm LVIDs:         2.60 cm LV PW:         1.40 cm LV IVS:        1.30 cm LVOT diam:     1.90 cm LV SV:         50 LV SV Index:   26 LVOT Area:     2.84 cm  LV Volumes (MOD) LV vol d, MOD A2C: 72.8 ml LV vol d, MOD A4C: 78.1 ml LV vol s, MOD A2C: 31.3 ml LV vol s, MOD A4C: 28.5 ml LV SV MOD A2C:     41.5 ml LV SV MOD A4C:     78.1 ml LV SV MOD BP:      47.8 ml RIGHT VENTRICLE             IVC RV S prime:     14.80 cm/s  IVC diam: 1.90 cm TAPSE (M-mode): 2.4 cm LEFT ATRIUM             Index       RIGHT ATRIUM           Index LA diam:        3.90 cm 2.07 cm/m  RA Area:     17.20 cm LA Vol (A2C):   26.4 ml 14.00 ml/m RA Volume:   48.90 ml  25.93 ml/m LA Vol (A4C):   39.3 ml 20.84 ml/m LA Biplane Vol: 33.4 ml 17.71 ml/m  AORTIC VALVE LVOT Vmax:   113.00 cm/s LVOT Vmean:  73.900 cm/s LVOT VTI:    0.176 m  AORTA Ao Root diam: 3.10 cm Ao Asc diam:  2.80 cm MITRAL VALVE                TRICUSPID VALVE MV Area (PHT): 5.62 cm     TR Peak grad:   24.2 mmHg MV Decel Time: 135 msec     TR Vmax:        246.00 cm/s MV E velocity: 134.33 cm/s                             SHUNTS  Systemic VTI:  0.18 m                             Systemic Diam: 1.90 cm Loralie Champagne MD Electronically signed by Loralie Champagne MD Signature Date/Time: 08/09/2020/5:26:25 PM    Final    Recent Labs    08/09/20 0457 08/10/20 0507  WBC 6.1  --   HGB 8.8* 9.9*  HCT 26.5* 30.3*  PLT 158  --    Recent Labs    08/09/20 0457  NA 133*  K 3.5  CL 93*  CO2 28  GLUCOSE 123*  BUN 44*  CREATININE 8.73*  CALCIUM 8.4*    Intake/Output Summary (Last 24 hours) at 08/11/2020 0833 Last data filed at 08/11/2020 0740 Gross per 24 hour  Intake 920 ml  Output --  Net 920 ml        Physical Exam: Vital Signs Blood pressure  129/69, pulse 85, temperature 98.2 F (36.8 C), resp. rate 18, height 5\' 1"  (1.549 m), weight 92 kg, SpO2 100 %.   Constitutional: No distress . Vital signs reviewed. HEENT: EOMI, oral membranes moist Neck: supple Cardiovascular: RRR without murmur. No JVD    Respiratory/Chest: CTA Bilaterally without wheezes or rales. Normal effort    GI/Abdomen: BS +, non-tender, non-distended Ext: no clubbing, cyanosis, or edema Psych: a little edgy this morning  Skin: left crani incision remains CDI Neurologic: motor strength is 5/5 in left 4 to 4+ right deltoid, bicep, tricep, grip, hip flexor, knee extensors, ankle dorsiflexor and plantar flexor Normal sensory and cerebellar. Musculoskeletal: Full range of motion in all 4 extremities. No joint swelling, sternal, left chest pain reproducible with palpation   Assessment/Plan: 1. Functional deficits which require 3+ hours per day of interdisciplinary therapy in a comprehensive inpatient rehab setting. Physiatrist is providing close team supervision and 24 hour management of active medical problems listed below. Physiatrist and rehab team continue to assess barriers to discharge/monitor patient progress toward functional and medical goals  Care Tool:  Bathing    Body parts bathed by patient: Right arm, Left arm, Chest, Abdomen, Front perineal area, Buttocks, Right upper leg, Left upper leg, Face   Body parts bathed by helper: Right lower leg, Left lower leg, Buttocks     Bathing assist Assist Level: Minimal Assistance - Patient > 75%     Upper Body Dressing/Undressing Upper body dressing   What is the patient wearing?: Pull over shirt    Upper body assist Assist Level: Minimal Assistance - Patient > 75%    Lower Body Dressing/Undressing Lower body dressing      What is the patient wearing?: Pants, Incontinence brief     Lower body assist Assist for lower body dressing: Maximal Assistance - Patient 25 - 49%      Toileting Toileting Toileting Activity did not occur (Clothing management and hygiene only): N/A (no void or bm)  Toileting assist Assist for toileting: Moderate Assistance - Patient 50 - 74%     Transfers Chair/bed transfer  Transfers assist     Chair/bed transfer assist level: Minimal Assistance - Patient > 75%     Locomotion Ambulation   Ambulation assist      Assist level: Minimal Assistance - Patient > 75% Assistive device: Hand held assist Max distance: 25 ft   Walk 10 feet activity   Assist     Assist level: Minimal Assistance - Patient > 75% Assistive device: Hand held assist  Walk 50 feet activity   Assist Walk 50 feet with 2 turns activity did not occur: Safety/medical concerns (limited by lethargy/fatigue)         Walk 150 feet activity   Assist Walk 150 feet activity did not occur: Safety/medical concerns         Walk 10 feet on uneven surface  activity   Assist Walk 10 feet on uneven surfaces activity did not occur: Safety/medical concerns         Wheelchair     Assist Will patient use wheelchair at discharge?: No   Wheelchair activity did not occur: Safety/medical concerns (limited assessment at eval due to lethargy/fatigue)         Wheelchair 50 feet with 2 turns activity    Assist    Wheelchair 50 feet with 2 turns activity did not occur: Safety/medical concerns       Wheelchair 150 feet activity     Assist  Wheelchair 150 feet activity did not occur: Safety/medical concerns       Blood pressure 129/69, pulse 85, temperature 98.2 F (36.8 C), resp. rate 18, height 5\' 1"  (1.549 m), weight 92 kg, SpO2 100 %.  Medical Problem List and Plan: 1.  Decreased functional mobility with gait disturbance secondary to traumatic left SDH with 4 mm left to right midline shift as well as small right subdural hematoma status post left parietal craniotomy evacuation of subdural hematoma 08/02/2020.  Tapered off  from Decadron, discussed with neurosurgery yesterday              -patient may shower             -ELOS/Goals: 08/20/20, sup to mod I goals. Does she have sup?  -Continue CIR therapies including PT, OT, and SLP  2.  Antithrombotics: -DVT/anticoagulation: SCDs             -antiplatelet therapy: N/A 3. Pain Management/post-traumatic headache: Hydrocodone as needed  -topamax trial 25mg  bid with benefit  -tylenol prn 4. Mood: Vita motional support             -antipsychotic agents: N/A 5. Neuropsych: This patient is capable of making decisions on her own behalf. 6. Skin/Wound Care: Routine skin checks 7. Fluids/Electrolytes/Nutrition: pt doesn't like renal restrictions  -will ask RD to review some options with her  -consider liberalizing diet also    -intake reasonable at times despite complaints 8.  Seizure prophylaxis.  Keppra 250 mg twice daily 9.  Atrial fibrillation/chest pain.  appreciate cards help.   -cardizem adjusted to 120mg  on non-HD days and 30 on HD days  -7/27 HR, BP controlled currently  -continue monitor with therapy activities  10.  Hypotension.  ProAmatine 20 mg Monday Wednesday Friday 11.  GERD.  Protonix- needs HOB up 30 deg at all times and upright during meals and 1 hour after  12.  End-stage renal disease/hyperkalemia.  Hemodialysis per renal service.  Continue Lokelma as directed  -HD after therapy day to allow maximal participation 13.  Diastolic congestive heart failure.  Monitor for any signs of fluid overload   Filed Weights   08/09/20 1543 08/10/20 0514 08/11/20 0452  Weight: 91.2 kg 89.9 kg 92 kg     14. Anemia: hgb down to 8.8  -likely acute on chronic. No signs of blood loss  -stool is OB+  -7/26 hgb up to 9.9 however---continue to monitor with HD labs  LOS: 4 days A FACE TO [place]  Meredith Staggers  08/11/2020, 8:33 AM

## 2020-08-11 NOTE — Progress Notes (Signed)
Occupational Therapy TBI Note  Patient Details  Name: Karen [name] MRN: [id number] Date of Birth: 04-25-1955  Today's Date: 08/11/2020 OT Individual Time: 2919-1660 OT Individual Time Calculation (min): 40 min    Short Term Goals: Week 1:  OT Short Term Goal 1 (Week 1): patient will complete functional transfers to/from bed, w/c and toilet with CGA OT Short Term Goal 2 (Week 1): patient will complete bathing and dressing with set up/min A using ADs as needed OT Short Term Goal 3 (Week 1): patient will complete basic meal prep/reach and transport of items with CGA  Skilled Therapeutic Interventions/Progress Updates:     Pt received in bed with no pain reported. Pt requesting to shower ADL:  Pt completes bathing with CGA sit to stand from TTB in shower with grab bar. Pt able ot lean forward to wash/dry feet in shower Pt completes UB dressing with set up for button up shirt Pt completes LB dressing with CGA for standing balance during advaning pants past hips sit to stand at w/c with RW. Pt completes footwear with total A for time managment Pt completes shower/Tub transfer with at ambulatory level with RW and MIN A for negotiating threshold into bathroom. Pt requires cuing for safety awareness during TTB transfer.  Pt left at end of session in w/c with breakfast with exit alarm on, call light in reach and all needs met   Therapy Documentation Precautions:  Precautions Precautions: Fall Precaution Comments: R UE fistula Restrictions Weight Bearing Restrictions: No General:   Vital Signs: Therapy Vitals Temp: 98.2 F (36.8 C) Pulse Rate: 85 Resp: 18 BP: 129/69 Patient Position (if appropriate): Lying Oxygen Therapy SpO2: 100 % O2 Device: Room Air Pain: Pain Assessment Pain Scale: 0-10 Pain Score: 0-No pain Pain Type: Acute pain Pain Location: Head Pain Orientation: Left Pain Intervention(s): Medication (See eMAR) Agitated Behavior Scale: TBI  Observation  Details Observation Environment: pt room Start of observation period - Date: 08/11/20 Start of observation period - Time: 0800 End of observation period - Date: 08/11/20 End of observation period - Time: 0842 Agitated Behavior Scale (DO NOT LEAVE BLANKS) Short attention span, easy distractibility, inability to concentrate: Absent Impulsive, impatient, low tolerance for pain or frustration: Absent Uncooperative, resistant to care, demanding: Absent Violent and/or threatening violence toward people or property: Absent Explosive and/or unpredictable anger: Absent Rocking, rubbing, moaning, or other self-stimulating behavior: Absent Pulling at tubes, restraints, etc.: Absent Wandering from treatment areas: Absent Restlessness, pacing, excessive movement: Absent Repetitive behaviors, motor, and/or verbal: Absent Rapid, loud, or excessive talking: Absent Sudden changes of mood: Absent Easily initiated or excessive crying and/or laughter: Absent Self-abusiveness, physical and/or verbal: Absent Agitated behavior scale total score: 14  ADL: ADL Eating: Set up Where Assessed-Eating: Chair Grooming: Setup Where Assessed-Grooming: Sitting at sink Upper Body Bathing: Setup Where Assessed-Upper Body Bathing: Sitting at sink Lower Body Bathing: Minimal assistance Where Assessed-Lower Body Bathing: Sitting at sink, Standing at sink Upper Body Dressing: Minimal assistance Where Assessed-Upper Body Dressing: Chair Lower Body Dressing: Maximal assistance Where Assessed-Lower Body Dressing: Chair Toileting: Moderate assistance Where Assessed-Toileting: Glass blower/designer: Psychiatric nurse Method: Counselling psychologist: Grab bars, Raised toilet seat ADL Comments: not cleared to shower Vision   Perception    Praxis   Exercises:   Other Treatments:     Therapy/Group: Individual Therapy  Tonny Branch 08/11/2020, 8:43 AM

## 2020-08-11 NOTE — Progress Notes (Signed)
Physical Therapy Session Note  Patient Details  Name: Karen [name] MRN: [id number] Date of Birth: 1955-03-25  Today's Date: 08/11/2020 PT Individual Time: 1115-1200 PT Individual Time Calculation (min): 45 min   Short Term Goals: Week 1:  PT Short Term Goal 1 (Week 1): Patient will complete standardized balance assessment. PT Short Term Goal 2 (Week 1): Patient will perform basic transfers with close supervision >75% of the time using LRAD. PT Short Term Goal 3 (Week 1): Patient will ambulate >250 ft using LRAD with CGA. PT Short Term Goal 4 (Week 1): Patient will perform at least 4 steps with 1 rail with min A.  Skilled Therapeutic Interventions/Progress Updates: Pt presents sitting in w/c and agreeable to therapy.  Pt c/o cold and wishes to go outside.  PT transported outside for time conservation.  Pt transfers sit to stand w/ CGA.  Pt amb 110' on uneven surfaces w/ HHA, but as distance increased, decreased reliance on HHA.  Pt amb multiple trials up to 130' w/o AD or HHA and noted toe-in, but pt states this was pre-morbid.  Pt had no LOB and tolerated well.  Pt amb from doorway into room and to bed w/ CGA.  Pt transferred sit to supine w/ supervision and remained in bed.  Bed alarm on and all needs in reach.     Therapy Documentation Precautions:  Precautions Precautions: Fall Precaution Comments: R UE fistula Restrictions Weight Bearing Restrictions: No General:   Vital Signs: Therapy Vitals Pulse Rate: 82 BP: 112/72 Pain: no c/o       Therapy/Group: Individual Therapy  [name] 08/11/2020, 12:36 PM

## 2020-08-11 NOTE — Progress Notes (Signed)
Speech Language Pathology TBI Note  Patient Details  Name: Karen [name] MRN: [id number] Date of Birth: 01-Sep-1955  Today's Date: 08/11/2020 SLP Individual Time: 7614-7092 SLP Individual Time Calculation (min): 39 min  Short Term Goals: Week 1: SLP Short Term Goal 1 (Week 1): STG=LTG due to ELOS  Skilled Therapeutic Interventions: Skilled treatment session focused on cognitive goals. Upon arrival, patient appeared mildly frustrated and sad about current discharge date. She reported this feels like prison, especially in regards to food preferences. Patient currently expresses a strong dislike of her current diet and reports she was not eating a renal diet at home. She reports she understands the health benefits but can only "eat what I can afford." Patient reported frustration regarding minimal help in the community for patient's with kidney disease despite the "financial demand" that is put on them due to their illness. SLP provided emotional support. SLP also provided patient with the proper menu for our unit in hopes of maximize options and overall PO intake. Also will speak to physician about potential for liberating diet. Patient handed off to PT. Continue with current plan of care.      Pain No/Denies Pain   Agitated Behavior Scale: TBI Observation Details Observation Environment: Patient's room Start of observation period - Date: 08/11/20 Start of observation period - Time: 1040 End of observation period - Date: 08/11/20 End of observation period - Time: 1119 Agitated Behavior Scale (DO NOT LEAVE BLANKS) Short attention span, easy distractibility, inability to concentrate: Absent Impulsive, impatient, low tolerance for pain or frustration: Absent Uncooperative, resistant to care, demanding: Absent Violent and/or threatening violence toward people or property: Absent Explosive and/or unpredictable anger: Absent Rocking, rubbing, moaning, or other self-stimulating  behavior: Absent Pulling at tubes, restraints, etc.: Absent Wandering from treatment areas: Absent Restlessness, pacing, excessive movement: Absent Repetitive behaviors, motor, and/or verbal: Absent Rapid, loud, or excessive talking: Absent Sudden changes of mood: Absent Easily initiated or excessive crying and/or laughter: Absent Self-abusiveness, physical and/or verbal: Absent Agitated behavior scale total score: 14  Therapy/Group: Individual Therapy  Kayti Poss 08/11/2020, 12:39 PM

## 2020-08-11 NOTE — Progress Notes (Signed)
[facility] Progress Note   Subjective:   Patient seen and examined at bedside.  Palpitations again this AM but now improved.  Denies CP, SOB, n/v/d, abdominal pain, and fatigue.    Objective Vitals:   08/10/20 1928 08/11/20 0452 08/11/20 1016 08/11/20 1301  BP: 115/72 129/69 112/72 134/75  Pulse: 96 85 82 88  Resp: 20 18  16   Temp: 98.4 F (36.9 C) 98.2 F (36.8 C)  98.2 F (36.8 C)  TempSrc:    Oral  SpO2: 99% 100%  100%  Weight:  92 kg    Height:       Physical Exam General:well appearing female in NAD Heart:RRR, no mrg Lungs:CTAB, nml WOB Abdomen:soft, NTND Extremities:no LE edema Dialysis Access: RU AVF +b/t   Filed Weights   08/09/20 1543 08/10/20 0514 08/11/20 0452  Weight: 91.2 kg 89.9 kg 92 kg    Intake/Output Summary (Last 24 hours) at 08/11/2020 1353 Last data filed at 08/11/2020 0740 Gross per 24 hour  Intake 440 ml  Output --  Net 440 ml    Additional Objective Labs: Basic Metabolic Panel: Recent Labs  Lab 08/09/20 0457  NA 133*  K 3.5  CL 93*  CO2 28  GLUCOSE 123*  BUN 44*  CREATININE 8.73*  CALCIUM 8.4*  PHOS 4.6   Liver Function Tests: Recent Labs  Lab 08/09/20 0457  ALBUMIN 2.4*    CBC: Recent Labs  Lab 08/09/20 0457 08/10/20 0507  WBC 6.1  --   HGB 8.8* 9.9*  HCT 26.5* 30.3*  MCV 98.9  --   PLT 158  --    Iron Studies:  Recent Labs    08/10/20 0507  IRON 55  TIBC 155*  FERRITIN 1,334*   Lab Results  Component Value Date   INR 1.1 08/02/2020   INR 1.00 07/05/2017   INR 1.16 12/18/2014   Studies/Results: ECHOCARDIOGRAM COMPLETE  Result Date: 08/09/2020    ECHOCARDIOGRAM REPORT   Patient Name:   Karen [name] Date of Exam: 08/09/2020 Medical Rec #:  [id number]               Height:       61.0 in Accession #:    [id number]              Weight:       199.3 lb Date of Birth:  03-07-1955                BSA:          1.886 m Patient Age:    65 years                BP:           112/61 mmHg  Patient Gender: F                       HR:           68 bpm. Exam Location:  Inpatient Procedure: 2D Echo, 3D Echo, Cardiac Doppler, Color Doppler and Strain Analysis Indications:    R07.9* Chest pain, unspecified  History:        Patient has prior history of Echocardiogram examinations, most                 recent 03/25/2018. Stroke, Signs/Symptoms:Dyspnea and Chest Pain;                 Risk Factors:Dyslipidemia. Subdural hematoma. Breast cancer.  Sonographer:  Roseanna Rainbow RDCS Referring Phys: [id number] RHONDA G BARRETT  Sonographer Comments: Global longitudinal strain was attempted. IMPRESSIONS  1. Left ventricular ejection fraction, by estimation, is 55 to 60%. The left ventricle has normal function. The left ventricle has no regional wall motion abnormalities. There is mild left ventricular hypertrophy. Left ventricular diastolic parameters are indeterminate.  2. Right ventricular systolic function is normal. The right ventricular size is mildly enlarged. There is normal pulmonary artery systolic pressure. The estimated right ventricular systolic pressure is 82.5 mmHg.  3. The mitral valve is normal in structure. Trivial mitral valve regurgitation. No evidence of mitral stenosis.  4. The aortic valve is tricuspid. Aortic valve regurgitation is not visualized. Mild aortic valve sclerosis is present, with no evidence of aortic valve stenosis.  5. The inferior vena cava is normal in size with <50% respiratory variability, suggesting right atrial pressure of 8 mmHg. FINDINGS  Left Ventricle: Left ventricular ejection fraction, by estimation, is 55 to 60%. The left ventricle has normal function. The left ventricle has no regional wall motion abnormalities. The left ventricular internal cavity size was normal in size. There is  mild left ventricular hypertrophy. Left ventricular diastolic parameters are indeterminate. Right Ventricle: The right ventricular size is mildly enlarged. No increase in right ventricular wall  thickness. Right ventricular systolic function is normal. There is normal pulmonary artery systolic pressure. The tricuspid regurgitant velocity is 2.46  m/s, and with an assumed right atrial pressure of 8 mmHg, the estimated right ventricular systolic pressure is 05.3 mmHg. Left Atrium: Left atrial size was normal in size. Right Atrium: Right atrial size was normal in size. Pericardium: There is no evidence of pericardial effusion. Mitral Valve: The mitral valve is normal in structure. There is mild calcification of the mitral valve leaflet(s). Mild mitral annular calcification. Trivial mitral valve regurgitation. No evidence of mitral valve stenosis. Tricuspid Valve: The tricuspid valve is normal in structure. Tricuspid valve regurgitation is mild. Aortic Valve: The aortic valve is tricuspid. Aortic valve regurgitation is not visualized. Mild aortic valve sclerosis is present, with no evidence of aortic valve stenosis. Pulmonic Valve: The pulmonic valve was normal in structure. Pulmonic valve regurgitation is not visualized. Aorta: The aortic root is normal in size and structure. Venous: The inferior vena cava is normal in size with less than 50% respiratory variability, suggesting right atrial pressure of 8 mmHg. IAS/Shunts: No atrial level shunt detected by color flow Doppler.  LEFT VENTRICLE PLAX 2D LVIDd:         3.50 cm LVIDs:         2.60 cm LV PW:         1.40 cm LV IVS:        1.30 cm LVOT diam:     1.90 cm LV SV:         50 LV SV Index:   26 LVOT Area:     2.84 cm  LV Volumes (MOD) LV vol d, MOD A2C: 72.8 ml LV vol d, MOD A4C: 78.1 ml LV vol s, MOD A2C: 31.3 ml LV vol s, MOD A4C: 28.5 ml LV SV MOD A2C:     41.5 ml LV SV MOD A4C:     78.1 ml LV SV MOD BP:      47.8 ml RIGHT VENTRICLE             IVC RV S prime:     14.80 cm/s  IVC diam: 1.90 cm TAPSE (M-mode): 2.4 cm LEFT ATRIUM  Index       RIGHT ATRIUM           Index LA diam:        3.90 cm 2.07 cm/m  RA Area:     17.20 cm LA Vol (A2C):    26.4 ml 14.00 ml/m RA Volume:   48.90 ml  25.93 ml/m LA Vol (A4C):   39.3 ml 20.84 ml/m LA Biplane Vol: 33.4 ml 17.71 ml/m  AORTIC VALVE LVOT Vmax:   113.00 cm/s LVOT Vmean:  73.900 cm/s LVOT VTI:    0.176 m  AORTA Ao Root diam: 3.10 cm Ao Asc diam:  2.80 cm MITRAL VALVE                TRICUSPID VALVE MV Area (PHT): 5.62 cm     TR Peak grad:   24.2 mmHg MV Decel Time: 135 msec     TR Vmax:        246.00 cm/s MV E velocity: 134.33 cm/s                             SHUNTS                             Systemic VTI:  0.18 m                             Systemic Diam: 1.90 cm Loralie Champagne MD Electronically signed by Loralie Champagne MD Signature Date/Time: 08/09/2020/5:26:25 PM    Final     Medications:   atorvastatin  10 mg Oral QPM   Chlorhexidine Gluconate Cloth  6 each Topical Q0600   darbepoetin (ARANESP) injection - DIALYSIS  40 mcg Intravenous Q Mon-HD   diltiazem  30 mg Oral Q M,W,F   levETIRAcetam  250 mg Oral BID   midodrine  20 mg Oral Q M,W,F-HD   multivitamin  1 tablet Oral QHS   pantoprazole  40 mg Oral BID   polyethylene glycol  17 g Oral Daily   senna  1 tablet Oral BID   sevelamer carbonate  2,400 mg Oral TID WC   topiramate  25 mg Oral BID    Dialysis Orders: [facility]  on MWF . EDW 86kg HD Bath 2K/2Ca  Time 4 hrs Heparin 4000 units bolus. 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  S/p drain removal and staples placed.  Did note right SDH but was small. Now in CIR.  [facility] - on MWF.  HD today per regular schedule.  Hypertension/volume  - stable BP. On midodrine 20mg  qHD. Not to EDW by weights here. Get standing weight next HD.  Anemia  - Hgb was falling but last improved to 9.9.  Tsat 35%, no iron needed.  Not on ESA, continue to follow trends.  Metabolic bone disease -  Ca/Phos at goal. Continue with home meds  Nutrition - renal diet  w/fluid restrictions. A fib - seen by cardiology, on cardizem.   GERD Diastolic congestive HF - fluid managed with dialysis. Repeat ECHO with EF 55-60% and indeterminiate diastolic function Dispo - Per SW note plan for d/c on 8/4 vs 8/5.  Reaching out to SW to see why  d/c noted to need to be moved to dialysis day.   Jen Mow, PA-C [facility] 08/11/2020,1:53 PM  LOS: 4 days

## 2020-08-11 NOTE — Progress Notes (Signed)
Physical Therapy TBI Note  Patient Details  Name: Karen [name] MRN: [id number] Date of Birth: 06/03/1955  Today's Date: 08/11/2020 PT Individual Time: 0905-1000 PT Individual Time Calculation (min): 55 min   Short Term Goals: Week 1:  PT Short Term Goal 1 (Week 1): Patient will complete standardized balance assessment. PT Short Term Goal 2 (Week 1): Patient will perform basic transfers with close supervision >75% of the time using LRAD. PT Short Term Goal 3 (Week 1): Patient will ambulate >250 ft using LRAD with CGA. PT Short Term Goal 4 (Week 1): Patient will perform at least 4 steps with 1 rail with min A.  Skilled Therapeutic Interventions/Progress Updates:     Pt received seated in [facility] and agrees to therapy. No complaint of pain. WC transport to gym for time management. Pt perform litegait training over treadmill. Pt stands to litegait with CGA. Pt assists with donning and doffing of harness before and after session. Step up onto treadmill with minA. Pt performs following bouts on treadmill with light bodyweight support. PT provides cueing during gait training for upright gaze to improve posture and balance, increasing stride length and step height to decrease risk for falls, and increasing trunk rotation and arm swing for improved balance. Pt noted to adduct and slightly internally rotate R leg with fatigue, also flexing forward more at the trunk with hips posterior to BOS. PT takes "before and after" pictures to demonstrate changes with fatigue to patient.  3:00 at 0.8-1.0 mph for 236' with bilateral upper extremity support 3:00 at 0.5-0.7 mph for 170' without upper extremity support  Pt takes seated rest break 3:01 at 1.0 mph for 264' with bilateral upper extremity support 3:33 at 1.0 mph for 311' with bilateral upper extremity support.  Pt left seated in WC with alarm intact and all needs within reach.  Therapy Documentation Precautions:  Precautions Precautions:  Fall Precaution Comments: R UE fistula Restrictions Weight Bearing Restrictions: No Pain: Pain Assessment Pain Scale: 0-10 Pain Score: 0-No pain Agitated Behavior Scale: TBI Observation Details Observation Environment: pt room Start of observation period - Date: 08/11/20 Start of observation period - Time: 0800 End of observation period - Date: 08/11/20 End of observation period - Time: 0842 Agitated Behavior Scale (DO NOT LEAVE BLANKS) Short attention span, easy distractibility, inability to concentrate: Absent Impulsive, impatient, low tolerance for pain or frustration: Absent Uncooperative, resistant to care, demanding: Absent Violent and/or threatening violence toward people or property: Absent Explosive and/or unpredictable anger: Absent Rocking, rubbing, moaning, or other self-stimulating behavior: Absent Pulling at tubes, restraints, etc.: Absent Wandering from treatment areas: Absent Restlessness, pacing, excessive movement: Absent Repetitive behaviors, motor, and/or verbal: Absent Rapid, loud, or excessive talking: Absent Sudden changes of mood: Absent Easily initiated or excessive crying and/or laughter: Absent Self-abusiveness, physical and/or verbal: Absent Agitated behavior scale total score: 14   Therapy/Group: Individual Therapy  Breck Coons, PT, DPT 08/11/2020, 9:34 AM

## 2020-08-12 LAB — CBC
HCT: 27.5 % — ABNORMAL LOW (ref 36.0–46.0)
Hemoglobin: 8.8 g/dL — ABNORMAL LOW (ref 12.0–15.0)
MCH: 32.1 pg (ref 26.0–34.0)
MCHC: 32 g/dL (ref 30.0–36.0)
MCV: 100.4 fL — ABNORMAL HIGH (ref 80.0–100.0)
Platelets: 227 10*3/uL (ref 150–400)
RBC: 2.74 MIL/uL — ABNORMAL LOW (ref 3.87–5.11)
RDW: 14.2 % (ref 11.5–15.5)
WBC: 4.5 10*3/uL (ref 4.0–10.5)
nRBC: 0 % (ref 0.0–0.2)

## 2020-08-12 LAB — RENAL FUNCTION PANEL
Albumin: 2.8 g/dL — ABNORMAL LOW (ref 3.5–5.0)
Anion gap: 12 (ref 5–15)
BUN: 40 mg/dL — ABNORMAL HIGH (ref 8–23)
CO2: 28 mmol/L (ref 22–32)
Calcium: 9 mg/dL (ref 8.9–10.3)
Chloride: 97 mmol/L — ABNORMAL LOW (ref 98–111)
Creatinine, Ser: 8.89 mg/dL — ABNORMAL HIGH (ref 0.44–1.00)
GFR, Estimated: 5 mL/min — ABNORMAL LOW (ref >60)
Glucose, Bld: 102 mg/dL — ABNORMAL HIGH (ref 70–99)
Phosphorus: 2.4 mg/dL — ABNORMAL LOW (ref 2.5–4.6)
Potassium: 3.6 mmol/L (ref 3.5–5.1)
Sodium: 137 mmol/L (ref 135–145)

## 2020-08-12 MED ORDER — ALTEPLASE 2 MG IJ SOLR: 2.0000 mg | Freq: Once | INTRAMUSCULAR | Status: DC | PRN

## 2020-08-12 MED ORDER — ACETAMINOPHEN 325 MG PO TABS
ORAL_TABLET | ORAL | Status: AC
  Administered 2020-08-12: 650 mg via ORAL
  Filled 2020-08-12: qty 2

## 2020-08-12 MED ORDER — PENTAFLUOROPROP-TETRAFLUOROETH EX AERO: 1.0000 "application " | INHALATION_SPRAY | CUTANEOUS | Status: DC | PRN

## 2020-08-12 MED ORDER — SODIUM CHLORIDE 0.9 % IV SOLN: 100.0000 mL | INTRAVENOUS | Status: DC | PRN

## 2020-08-12 MED ORDER — HEPARIN SODIUM (PORCINE) 1000 UNIT/ML DIALYSIS: 1000.0000 [IU] | INTRAMUSCULAR | Status: DC | PRN

## 2020-08-12 MED ORDER — LIDOCAINE HCL (PF) 1 % IJ SOLN: 5.0000 mL | INTRAMUSCULAR | Status: DC | PRN

## 2020-08-12 MED ORDER — LIDOCAINE-PRILOCAINE 2.5-2.5 % EX CREA: 1.0000 "application " | TOPICAL_CREAM | CUTANEOUS | Status: DC | PRN

## 2020-08-12 NOTE — Progress Notes (Signed)
Physical Therapy TBI Note  Patient Details  Name: Karen [name] MRN: [id number] Date of Birth: [birth year]  Today's Date: 08/12/2020 PT Individual Time: 0803-0859 PT Individual Time Calculation (min): 56 min   Short Term Goals: Week 1:  PT Short Term Goal 1 (Week 1): Patient will complete standardized balance assessment. PT Short Term Goal 2 (Week 1): Patient will perform basic transfers with close supervision >75% of the time using LRAD. PT Short Term Goal 3 (Week 1): Patient will ambulate >250 ft using LRAD with CGA. PT Short Term Goal 4 (Week 1): Patient will perform at least 4 steps with 1 rail with min A.  Skilled Therapeutic Interventions/Progress Updates:     Pt received supin in bed. States that she is tired because she did not get dialysis as scheduled yesterday and is unsure when it might be rescheduled. No pain reported. Pt performs bed mobility slowly with bed features and cues on positioning. Pt dons shoes while seated at EOB with setup assistance from PT. Sit to stand with CGA. Pt ambulates 120' to dayroom with CGA and verbal cues to increase gait speed, bilateral stride length and step height, and increasing trunk rotation and arm swing to improve balance and decrease risk for falls. Pt performs Nustep for strength and endurance training. Pt performs 12:00 on workload of 5 with steps per minute ~50. Music provided and pt responds well with increased energy levels.  Pt ambulates x300' with RW and CGA, with same cues as with previous ambulation bout. Pt takes extended seated rest break prior to ambulating additional 300'. Pt performs sit to supine with supervision and cues for positioning. Left supine with alarm intact and all needs within reach.  Therapy Documentation Precautions:  Precautions Precautions: Fall Precaution Comments: R UE fistula Restrictions Weight Bearing Restrictions: No   Therapy/Group: Individual Therapy  Breck Coons, PT, DPT 08/12/2020,  4:10 PM

## 2020-08-12 NOTE — Progress Notes (Signed)
Pt very upset at the beginning of the shift d/t the fact that she had not gone to HD yet. RN called HD unit and was informed pt will not be done tonight. Pt expressed her frustration with HD department not telling her earlier and not taking her when they know she was supposed to go. Writer allowed the patient to express her frustration and informed her that I had spoken to the HD nurse and she informed Probation officer that she will receive her HD tomorrow afternoon

## 2020-08-12 NOTE — Progress Notes (Signed)
PROGRESS NOTE   Subjective/Complaints:  Didn't receive HD last night d/t availability of spot in PM. Waiting to go this morning. Still unhappy with diet. Found something a little better that she was able to eat last night  ROS: Patient denies fever, rash, sore throat, blurred vision, nausea, vomiting, diarrhea, cough, shortness of breath or chest pain, joint or back pain, headache, or mood change.   Objective:   No results found. Recent Labs    08/10/20 0507  HGB 9.9*  HCT 30.3*   No results for input(s): NA, K, CL, CO2, GLUCOSE, BUN, CREATININE, CALCIUM in the last 72 hours.   Intake/Output Summary (Last 24 hours) at 08/12/2020 1058 Last data filed at 08/11/2020 1801 Gross per 24 hour  Intake 360 ml  Output --  Net 360 ml        Physical Exam: Vital Signs Blood pressure 121/71, pulse 85, temperature 98.3 F (36.8 C), temperature source Oral, resp. rate 20, height 5\' 1"  (1.549 m), weight 90.8 kg, SpO2 99 %.   Constitutional: No distress . Vital signs reviewed. HEENT: EOMI, oral membranes moist Neck: supple Cardiovascular: RRR without murmur. No JVD    Respiratory/Chest: CTA Bilaterally without wheezes or rales. Normal effort    GI/Abdomen: BS +, non-tender, non-distended Ext: no clubbing, cyanosis, or edema Psych: pleasant and cooperative  Skin: left crani incision remains CDI Neurologic: motor strength is 5/5 in left 4 to 4+ right deltoid, bicep, tricep, grip, hip flexor, knee extensors, ankle dorsiflexor and plantar flexor Normal sensory and cerebellar. Musculoskeletal: Full range of motion in all 4 extremities. No joint swelling, sternal, left chest pain reproducible with palpation   Assessment/Plan: 1. Functional deficits which require 3+ hours per day of interdisciplinary therapy in a comprehensive inpatient rehab setting. Physiatrist is providing close team supervision and 24 hour management of active  medical problems listed below. Physiatrist and rehab team continue to assess barriers to discharge/monitor patient progress toward functional and medical goals  Care Tool:  Bathing    Body parts bathed by patient: Right arm, Left arm, Chest, Abdomen, Front perineal area, Buttocks, Right upper leg, Left upper leg, Face   Body parts bathed by helper: Right lower leg, Left lower leg, Buttocks     Bathing assist Assist Level: Minimal Assistance - Patient > 75%     Upper Body Dressing/Undressing Upper body dressing   What is the patient wearing?: Pull over shirt    Upper body assist Assist Level: Supervision/Verbal cueing    Lower Body Dressing/Undressing Lower body dressing      What is the patient wearing?: Underwear/pull up, Pants     Lower body assist Assist for lower body dressing: Moderate Assistance - Patient 50 - 74%     Toileting Toileting Toileting Activity did not occur (Clothing management and hygiene only): N/A (no void or bm)  Toileting assist Assist for toileting: Moderate Assistance - Patient 50 - 74%     Transfers Chair/bed transfer  Transfers assist     Chair/bed transfer assist level: Minimal Assistance - Patient > 75%     Locomotion Ambulation   Ambulation assist      Assist level: Contact Guard/Touching assist Assistive  device: Hand held assist Max distance: 110   Walk 10 feet activity   Assist     Assist level: Contact Guard/Touching assist Assistive device: Hand held assist   Walk 50 feet activity   Assist Walk 50 feet with 2 turns activity did not occur: Safety/medical concerns (limited by lethargy/fatigue)  Assist level: Contact Guard/Touching assist Assistive device: Hand held assist    Walk 150 feet activity   Assist Walk 150 feet activity did not occur: Safety/medical concerns         Walk 10 feet on uneven surface  activity   Assist Walk 10 feet on uneven surfaces activity did not occur: Safety/medical  concerns   Assist level: Contact Guard/Touching assist Assistive device: Hand held assist   Wheelchair     Assist Will patient use wheelchair at discharge?: No   Wheelchair activity did not occur: Safety/medical concerns (limited assessment at eval due to lethargy/fatigue)         Wheelchair 50 feet with 2 turns activity    Assist    Wheelchair 50 feet with 2 turns activity did not occur: Safety/medical concerns       Wheelchair 150 feet activity     Assist  Wheelchair 150 feet activity did not occur: Safety/medical concerns       Blood pressure 121/71, pulse 85, temperature 98.3 F (36.8 C), temperature source Oral, resp. rate 20, height 5\' 1"  (1.549 m), weight 90.8 kg, SpO2 99 %.  Medical Problem List and Plan: 1.  Decreased functional mobility with gait disturbance secondary to traumatic left SDH with 4 mm left to right midline shift as well as small right subdural hematoma status post left parietal craniotomy evacuation of subdural hematoma 08/02/2020.  Tapered off from Decadron, discussed with neurosurgery yesterday              -patient may shower             -ELOS/Goals: 08/20/20, sup to mod I goals.   -Continue CIR therapies including PT, OT, and SLP   2.  Antithrombotics: -DVT/anticoagulation: SCDs             -antiplatelet therapy: N/A 3. Pain Management/post-traumatic headache: Hydrocodone as needed  -topamax trial 25mg  bid with benefit  -tylenol prn 4. Mood: Vita motional support             -antipsychotic agents: N/A 5. Neuropsych: This patient is capable of making decisions on her own behalf. 6. Skin/Wound Care: dc staples this weekend 7. Fluids/Electrolytes/Nutrition: pt doesn't like renal restrictions  -will liberalize diet with FR 8.  Seizure prophylaxis.  Keppra 250 mg twice daily 9.  Atrial fibrillation/chest pain.  appreciate cards help.   -cardizem adjusted to 120mg  on non-HD days and 30 on HD days  -7/28 HR, BP controlled  currently  -continue monitor with therapy activities  10.  Hypotension.  ProAmatine 20 mg Monday Wednesday Friday 11.  GERD.  Protonix- needs HOB up 30 deg at all times and upright during meals and 1 hour after  12.  End-stage renal disease/hyperkalemia.  Hemodialysis per renal service.  Continue Lokelma as directed  -HD after therapy day to allow maximal participation 13.  Diastolic congestive heart failure.  Monitor for any signs of fluid overload   Filed Weights   08/10/20 0514 08/11/20 0452 08/12/20 0516  Weight: 89.9 kg 92 kg 90.8 kg     14. Anemia:   -likely acute on chronic.    -stool is OB+  -but 7/26 hgb  up to 9.9---continue to monitor with HD labs  LOS: 5 days A FACE TO FACE EVALUATION WAS PERFORMED  Meredith Staggers 08/12/2020, 10:58 AM

## 2020-08-12 NOTE — Progress Notes (Signed)
Occupational Therapy TBI Note  Patient Details  Name: Karen [name] MRN: [id number] Date of Birth: 12/08/1955  Today's Date: 08/12/2020 OT Individual Time: 2542-7062 OT Individual Time Calculation (min): 55 min    Short Term Goals: Week 1:  OT Short Term Goal 1 (Week 1): patient will complete functional transfers to/from bed, w/c and toilet with CGA OT Short Term Goal 2 (Week 1): patient will complete bathing and dressing with set up/min A using ADs as needed OT Short Term Goal 3 (Week 1): patient will complete basic meal prep/reach and transport of items with CGA  Skilled Therapeutic Interventions/Progress Updates:  Met pt sitting with HOB elevated, bed alarm on, pt agreed to session. Treatment session was focused on self-care retraining and sit > stands during BADLs. Labs entered room to draw blood, pt became slightly annoyed with technician "digging" for vein. OT and OTS able to use therapeutic use of self to deescalate pt. Pt was supervision for bed mobility > EOB. Pt was CGA for sit > stand with RW. Pt ambulated short distance with CGA. Pt managed UB dressing with set-up assist seated, LB dressing with CGA sit > stand, and footwear CGA seated. Pt able to recognize precautions from cranial incision before bathing. Pt completed bathing tasks with overall CGA. Pt completed sit > stand for LB bathing using rails to stabilize, OTS encouraged pt to notify when standing for safety. Pt ambulated with RW > EOB with CGA. Transporter to take pt to dialysis, did not complete grooming at sink. Throughout session, OTS reminded to conserve energy during dressing tasks to thread pants over feet in sitting then stand to bring pants over hips. Pt was left lying in bed, bed alarm on, all needs met.   Therapy Documentation Precautions:  Precautions Precautions: Fall Precaution Comments: R UE fistula Restrictions Weight Bearing Restrictions: No Vital Signs: Therapy Vitals Temp: 98 F (36.7  C) Temp Source: Oral Pulse Rate: 83 Resp: 16 BP: 126/60 Patient Position (if appropriate): Lying Oxygen Therapy SpO2: 100 % O2 Device: Room Air Pain: Pain Assessment Pain Scale: 0-10 Pain Score: 0-No pain Faces Pain Scale: No hurt Agitated Behavior Scale: TBI Observation Details Observation Environment: Room Start of observation period - Date: 08/12/20 Start of observation period - Time: 1130 End of observation period - Date: 08/12/20 End of observation period - Time: 1230 Agitated Behavior Scale (DO NOT LEAVE BLANKS) Short attention span, easy distractibility, inability to concentrate: Absent Impulsive, impatient, low tolerance for pain or frustration: Absent Uncooperative, resistant to care, demanding: Absent Violent and/or threatening violence toward people or property: Absent Explosive and/or unpredictable anger: Absent Rocking, rubbing, moaning, or other self-stimulating behavior: Absent Pulling at tubes, restraints, etc.: Absent Wandering from treatment areas: Absent Restlessness, pacing, excessive movement: Absent Repetitive behaviors, motor, and/or verbal: Absent Rapid, loud, or excessive talking: Absent Sudden changes of mood: Absent Easily initiated or excessive crying and/or laughter: Absent Self-abusiveness, physical and/or verbal: Absent Agitated behavior scale total score: 14  Therapy/Group: Individual Therapy  Bayard Males 08/12/2020, 2:47 PM

## 2020-08-12 NOTE — Progress Notes (Signed)
Initial Nutrition Assessment  DOCUMENTATION CODES:  Obesity unspecified  INTERVENTION:  Continue regular diet.  Add Magic cup TID with meals, each supplement provides 290 kcal and 9 grams of protein.  Add Room Service assistance when ordering meals to obtain preferences.  Continue Rena-Vite daily.  NUTRITION DIAGNOSIS:  Increased nutrient needs related to chronic illness (ESRD on HD) as evidenced by estimated needs.  GOAL:  Patient will meet greater than or equal to 90% of their needs  MONITOR:  PO intake, Supplement acceptance, Labs, Weight trends, I & O's  REASON FOR ASSESSMENT:  Consult Other (Comment) ("pt doesn't like any of renal food. could you please review some options which may be more appealing to her before we liberalize her diet?")  ASSESSMENT:  65 yo female with a PMH of diastolic CHF, colitis, A-fib, ESRD on HD MWF, HTN, R breast cancer (2019) who presents to rehab with traumatic subdural hematoma.  Pt with an average of ~55% intake over the past 8 meals with renal diet.   Diet just liberalized to regular by MD late this morning. RD thinks this will help with increasing pt's intake.  Admit wt: 93.7 kg Current wt: 90.8 kg Weight changes likely fluid shifts.  Medications: reviewed; Keppra BID, midodrine MWF, Rena-Vit, Protonix BID, miralax, Senokot BID, Renvela TID, Vicodin PRN (given once today)  Labs: reviewed  NUTRITION - FOCUSED PHYSICAL EXAM: Flowsheet Row Most Recent Value  Orbital Region Mild depletion  Upper Arm Region No depletion  Thoracic and Lumbar Region No depletion  Buccal Region No depletion  Temple Region No depletion  Clavicle Bone Region No depletion  Clavicle and Acromion Bone Region No depletion  Scapular Bone Region No depletion  Dorsal Hand No depletion  Patellar Region No depletion  Anterior Thigh Region No depletion  Posterior Calf Region No depletion  Edema (RD Assessment) Mild  [Generalized]  Hair Reviewed  Eyes  Reviewed  Mouth Reviewed  Skin Reviewed  Nails Reviewed   Diet Order:   Diet Order             Diet regular Room service appropriate? Yes with Assist; Fluid consistency: Thin; Fluid restriction: 1200 mL Fluid  Diet effective now                  EDUCATION NEEDS:  Education needs have been addressed  Skin:  Skin Assessment: Skin Integrity Issues: Skin Integrity Issues:: Incisions Incisions: Head, closed  Last BM:  08/11/20 - Type 1, large  Height:  Ht Readings from Last 1 Encounters:  08/07/20 5\' 1"  (1.549 m)   Weight:  Wt Readings from Last 1 Encounters:  08/12/20 90.8 kg   BMI:  Body mass index is 37.82 kg/m.  Estimated Nutritional Needs:  Kcal:  2050-2250 Protein:  105-120 grams Fluid:  >2 L  Derrel Nip, RD, LDN (she/her/hers) Registered Dietitian I After-Hours/Weekend Pager # in [place]

## 2020-08-12 NOTE — Progress Notes (Signed)
Physical Therapy Session Note  Patient Details  Name: Karen [name] MRN: [id number] Date of Birth: Oct 04, 1955  Today's Date: 08/12/2020 PT Individual Time: 1000-1030 PT Individual Time Calculation (min): 30 min   Short Term Goals: Week 1:  PT Short Term Goal 1 (Week 1): Patient will complete standardized balance assessment. PT Short Term Goal 2 (Week 1): Patient will perform basic transfers with close supervision >75% of the time using LRAD. PT Short Term Goal 3 (Week 1): Patient will ambulate >250 ft using LRAD with CGA. PT Short Term Goal 4 (Week 1): Patient will perform at least 4 steps with 1 rail with min A.  Skilled Therapeutic Interventions/Progress Updates: Pt presented in bed agreeable to therapy. Pt denies pain during session. Performed supine to sit with supervision, increased time and use of bed features. Donned shoes with set up. Pt ambulated to Day room with CGA. Participated in several rounds of corn hole for dynamic reaching and balance without seated rest for standing tolerance. Pt initially required verbal cues to increase reaching outside BOS however improved with repetition. Pt also participated in toe taps to small cones for wt shifting, pt unable to complete tap without HHA however was able to to single taps and alternating taps with HHA. Pt ambulated back to room at end of session and returned to bed with supervision. Pt left in bed at end of session with bed alarm on, call bell within reach and needs met.      Therapy Documentation Precautions:  Precautions Precautions: Fall Precaution Comments: R UE fistula Restrictions Weight Bearing Restrictions: No General:   Vital Signs: Therapy Vitals Temp: 98 F (36.7 C) Temp Source: Oral Pulse Rate: 83 Resp: 16 BP: 126/60 Patient Position (if appropriate): Lying Oxygen Therapy SpO2: 100 % O2 Device: Room Air Pain: Pain Assessment Pain Scale: 0-10 Pain Score: 0-No pain Faces Pain Scale: No  hurt Mobility:   Locomotion :    Trunk/Postural Assessment :    Balance:   Exercises:   Other Treatments:      Therapy/Group: Individual Therapy  Norm Wray 08/12/2020, 2:40 PM

## 2020-08-12 NOTE — Progress Notes (Signed)
[facility] Progress Note   Subjective:   Patient seen and examined in room.  Reports headache last night and this AM.  Aggravated her dialysis was rolled over to today.  Otherwise doing ok.   Objective Vitals:   08/11/20 1016 08/11/20 1301 08/11/20 1927 08/12/20 0516  BP: 112/72 134/75 127/70 121/71  Pulse: 82 88 90 85  Resp:  16 18 20   Temp:  98.2 F (36.8 C) 98.4 F (36.9 C) 98.3 F (36.8 C)  TempSrc:  Oral Oral Oral  SpO2:  100% 100% 99%  Weight:    90.8 kg  Height:       Physical Exam General:well appearing female in NAD Heart:RRR, no mrg Lungs:CTAB, nml WOB Abdomen:soft, NTND Extremities:no LE edema Dialysis Access: RU AVF, +b/t   Filed Weights   08/10/20 0514 08/11/20 0452 08/12/20 0516  Weight: 89.9 kg 92 kg 90.8 kg    Intake/Output Summary (Last 24 hours) at 08/12/2020 1231 Last data filed at 08/11/2020 1801 Gross per 24 hour  Intake 360 ml  Output --  Net 360 ml    Additional Objective Labs: Basic Metabolic Panel: Recent Labs  Lab 08/09/20 0457  NA 133*  K 3.5  CL 93*  CO2 28  GLUCOSE 123*  BUN 44*  CREATININE 8.73*  CALCIUM 8.4*  PHOS 4.6   Liver Function Tests: Recent Labs  Lab 08/09/20 0457  ALBUMIN 2.4*    CBC: Recent Labs  Lab 08/09/20 0457 08/10/20 0507  WBC 6.1  --   HGB 8.8* 9.9*  HCT 26.5* 30.3*  MCV 98.9  --   PLT 158  --    Iron Studies:  Recent Labs    08/10/20 0507  IRON 55  TIBC 155*  FERRITIN 1,334*    Medications:  sodium chloride     sodium chloride      atorvastatin  10 mg Oral QPM   Chlorhexidine Gluconate Cloth  6 each Topical Q0600   darbepoetin (ARANESP) injection - DIALYSIS  40 mcg Intravenous Q Mon-HD   diltiazem  30 mg Oral Q M,W,F   levETIRAcetam  250 mg Oral BID   midodrine  20 mg Oral Q M,W,F-HD   multivitamin  1 tablet Oral QHS   pantoprazole  40 mg Oral BID   polyethylene glycol  17 g Oral Daily   senna  1 tablet Oral BID   sevelamer carbonate  2,400 mg Oral TID WC    topiramate  25 mg Oral BID    Dialysis Orders: [facility]  on MWF . EDW 86kg HD Bath 2K/2Ca  Time 4 hrs Heparin 4000 units bolus. 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  S/p drain removal and staples placed.  Did note right SDH but was small. Now in CIR.  ESRD - on MWF.  HD off schedule today due to increased patient census and staffing issues.    Hypertension/volume  - stable BP. On midodrine 20mg  qHD. Not to EDW by weights here.  Does not appear volume overloaded.   Anemia  - Hgb was falling but last improved to 9.9.  Tsat 35%, no iron needed.  Not on ESA, continue to follow trends.  Checking labs pre HD today.  Metabolic bone disease -  Ca/Phos at goal. Continue with home meds  Nutrition - renal diet w/fluid restrictions. A  fib - seen by cardiology, on cardizem.   GERD Diastolic congestive HF - fluid managed with dialysis. Repeat ECHO with EF 55-60% and indeterminiate diastolic function Dispo - To d/c on 8/4.  Jen Mow, PA-C [facility] 08/12/2020,12:31 PM  LOS: 5 days

## 2020-08-12 NOTE — Progress Notes (Signed)
SLP Cancellation Note  Patient Details Name: Blakelyn Dinges MRN: [id number] DOB: January 31, 1955   Cancelled treatment:       Patient missed 45 minutes of skilled SLP intervention due to being off unit for dialysis.    Latorria Zeoli 08/12/2020, 1:23 PM

## 2020-08-13 LAB — RENAL FUNCTION PANEL
Albumin: 2.6 g/dL — ABNORMAL LOW (ref 3.5–5.0)
Anion gap: 10 (ref 5–15)
BUN: 36 mg/dL — ABNORMAL HIGH (ref 8–23)
CO2: 29 mmol/L (ref 22–32)
Calcium: 8.7 mg/dL — ABNORMAL LOW (ref 8.9–10.3)
Chloride: 97 mmol/L — ABNORMAL LOW (ref 98–111)
Creatinine, Ser: 8.69 mg/dL — ABNORMAL HIGH (ref 0.44–1.00)
GFR, Estimated: 5 mL/min — ABNORMAL LOW (ref >60)
Glucose, Bld: 151 mg/dL — ABNORMAL HIGH (ref 70–99)
Phosphorus: 2.2 mg/dL — ABNORMAL LOW (ref 2.5–4.6)
Potassium: 4.6 mmol/L (ref 3.5–5.1)
Sodium: 136 mmol/L (ref 135–145)

## 2020-08-13 LAB — CBC
HCT: 25.5 % — ABNORMAL LOW (ref 36.0–46.0)
Hemoglobin: 8.3 g/dL — ABNORMAL LOW (ref 12.0–15.0)
MCH: 33.1 pg (ref 26.0–34.0)
MCHC: 32.5 g/dL (ref 30.0–36.0)
MCV: 101.6 fL — ABNORMAL HIGH (ref 80.0–100.0)
Platelets: 199 10*3/uL (ref 150–400)
RBC: 2.51 MIL/uL — ABNORMAL LOW (ref 3.87–5.11)
RDW: 14.2 % (ref 11.5–15.5)
WBC: 4.9 10*3/uL (ref 4.0–10.5)
nRBC: 0 % (ref 0.0–0.2)

## 2020-08-13 LAB — HEPATITIS B SURFACE ANTIBODY, QUANTITATIVE: Hep B S AB Quant (Post): >1000 m[IU]/mL (ref >9.9)

## 2020-08-13 NOTE — Progress Notes (Addendum)
  [facility] Progress Note   Subjective:  Seen in room - headache improving. No CP or dyspnea. For HD later today.  Objective Vitals:   08/12/20 1554 08/12/20 1658 08/12/20 2007 08/13/20 0529  BP: 122/90 113/68 124/67 (!) 118/52  Pulse: 86 86 90 87  Resp: 16 18 18 20   Temp: 97.9 F (36.6 C) 98.3 F (36.8 C) 98.4 F (36.9 C) 98.6 F (37 C)  TempSrc:   Oral Oral  SpO2:  99% 100% 100%  Weight:    89.9 kg  Height:       Physical Exam General: Well appearing woman, NAD. Staples to L scalp. Room air. Heart: RRR; no murmur Lungs: CTAB Abdomen: soft  Extremities: No LE edema Dialysis Access: RUE AVF + thrill  Additional Objective Labs: Basic Metabolic Panel: Recent Labs  Lab 08/09/20 0457 08/12/20 1200  NA 133* 137  K 3.5 3.6  CL 93* 97*  CO2 28 28  GLUCOSE 123* 102*  BUN 44* 40*  CREATININE 8.73* 8.89*  CALCIUM 8.4* 9.0  PHOS 4.6 2.4*   Liver Function Tests: Recent Labs  Lab 08/09/20 0457 08/12/20 1200  ALBUMIN 2.4* 2.8*   CBC: Recent Labs  Lab 08/09/20 0457 08/10/20 0507 08/12/20 1200  WBC 6.1  --  4.5  HGB 8.8* 9.9* 8.8*  HCT 26.5* 30.3* 27.5*  MCV 98.9  --  100.4*  PLT 158  --  227   Medications:   atorvastatin  10 mg Oral QPM   darbepoetin (ARANESP) injection - DIALYSIS  40 mcg Intravenous Q Mon-HD   diltiazem  30 mg Oral Q M,W,F   levETIRAcetam  250 mg Oral BID   midodrine  20 mg Oral Q M,W,F-HD   multivitamin  1 tablet Oral QHS   pantoprazole  40 mg Oral BID   polyethylene glycol  17 g Oral Daily   senna  1 tablet Oral BID   sevelamer carbonate  2,400 mg Oral TID WC   topiramate  25 mg Oral BID    Dialysis Orders: [facility]  on MWF  EDW 86kg Bath 2K/2Ca 4 hrs Heparin 4000 units bolus. RUE AVF BFR 350 DFR 500    Hectoral 5 mcg IV/HD,  Cinacalcet 30 mg with HD TIW   Assessment/Plan: Subdural hematoma L>R - s/p left parietal craniotomy and evacuation of hematoma.  S/p drain removal and staples placed.  Did note right SDH but  was small. Now in CIR.  ESRD: Back to usual MWF schedule -> HD today.  Hypertension/volume  - stable BP. On midodrine 20mg  qHD. Not to EDW by weights here.  Does not appear volume overloaded.   Anemia: Hgb down to 8.8,  continue Aranesp 40 q Mon. Tsat 35%, no iron needed.    Metabolic bone disease -  Ca/Phos at goal. Continue with home meds - possible reduce binder dose if Phos low again.  Nutrition - renal diet w/fluid restrictions. A fib - seen by cardiology, on cardizem.   GERD Diastolic congestive HF - fluid managed with dialysis. Repeat ECHO with EF 55-60% and indeterminiate diastolic function Dispo - To d/c on 8/4.  Veneta Penton, PA-C 08/13/2020, 10:08 AM  [employer]

## 2020-08-13 NOTE — Progress Notes (Signed)
Physical Therapy TBI Note  Patient Details  Name: Karen [name] MRN: [id number] Date of Birth: 11/16/1955  Today's Date: 08/13/2020 PT Individual Time: 1045-1200 PT Individual Time Calculation (min): 75 min   Short Term Goals: Week 1:  PT Short Term Goal 1 (Week 1): Patient will complete standardized balance assessment. PT Short Term Goal 2 (Week 1): Patient will perform basic transfers with close supervision >75% of the time using LRAD. PT Short Term Goal 3 (Week 1): Patient will ambulate >250 ft using LRAD with CGA. PT Short Term Goal 4 (Week 1): Patient will perform at least 4 steps with 1 rail with min A. Week 2:    Week 3:     Skilled Therapeutic Interventions/Progress Updates:   Pt initially oob in wc, requests therapist take her outdoors for session.  Pt transported to patio for outdoor mobility.  Noted BP per flowchart,Therapist donned Ted Hose, socks, shoes.   Sit to stand w/cga to Rw   pt stands w/supervision w/RW to "enjoy the breeze" for several minutes.   Gait trials on uneven brick sidewalk, curb cutouts, pavement, uneven sidewalks w/inclines/declines total distance each walk >261ft w/cga, occasional cues for maintaining safe proximity to walker. Repeated gait x 3 for total distance 657ft w/seated rest breaks, cues as above.  Pt engages in conversation, expresses enjoyment of outdoor environment, sense of relaxation/restoration from spending time outdoors. Requires extended rest breaks in sitting due to fatigue. Gait 75ft over uneven path to stairs w/cga and cues.  Ascends/descends 4 steps x 2  w/single rail and cga and continued gait x 2ft backto wc as above.   Pt transported back to room. stand pivot transfer wc to bed w/close supervision and use of bedrail.  Sit to supine w/supervision.  Pt set up w/lunch tray by therapist.  After checking w/nursing, therapist provided pt w/garden grown cucumber slices. Pt left w/bed in upright position for meal w/rails up x 4,  alarm set, bed in lowest position, and needs in reach.   Therapy Documentation    \\Precautions:  Precautions Precautions: Fall Precaution Comments: R UE fistula Restrictions Weight Bearing Restrictions: No  Pain: denies pain Pain Assessment Pain Scale: 0-10 Pain Score: 0-No pain Faces Pain Scale: No hurt Agitated Behavior Scale: TBI Observation Details Observation Environment: outdoors Start of observation period - Date: 08/13/20 Start of observation period - Time: 1045 End of observation period - Date: 08/13/20 End of observation period - Time: 1200 Agitated Behavior Scale (DO NOT LEAVE BLANKS) Short attention span, easy distractibility, inability to concentrate: Absent Impulsive, impatient, low tolerance for pain or frustration: Absent Uncooperative, resistant to care, demanding: Absent Violent and/or threatening violence toward people or property: Absent Explosive and/or unpredictable anger: Absent Rocking, rubbing, moaning, or other self-stimulating behavior: Absent Pulling at tubes, restraints, etc.: Absent Wandering from treatment areas: Absent Restlessness, pacing, excessive movement: Absent Repetitive behaviors, motor, and/or verbal: Absent Rapid, loud, or excessive talking: Absent Sudden changes of mood: Absent Easily initiated or excessive crying and/or laughter: Absent Self-abusiveness, physical and/or verbal: Absent Agitated behavior scale total score: 14    Therapy/Group: Individual Therapy Callie Fielding, [place] 08/13/2020, 12:26 PM

## 2020-08-13 NOTE — Progress Notes (Signed)
PROGRESS NOTE   Subjective/Complaints:  Up at sink with OT. Seems to be in good spirits today. No real complaints.   ROS: Patient denies fever, rash, sore throat, blurred vision, nausea, vomiting, diarrhea, cough, shortness of breath or chest pain, joint or back pain, headache, or mood change.   Objective:   No results found. Recent Labs    08/12/20 1200  WBC 4.5  HGB 8.8*  HCT 27.5*  PLT 227   Recent Labs    08/12/20 1200  NA 137  K 3.6  CL 97*  CO2 28  GLUCOSE 102*  BUN 40*  CREATININE 8.89*  CALCIUM 9.0     Intake/Output Summary (Last 24 hours) at 08/13/2020 1321 Last data filed at 08/13/2020 0900 Gross per 24 hour  Intake 820 ml  Output 3001 ml  Net -2181 ml        Physical Exam: Vital Signs Blood pressure 107/75, pulse 87, temperature 98.6 F (37 C), temperature source Oral, resp. rate 20, height 5\' 1"  (1.549 m), weight 89.9 kg, SpO2 100 %.   Constitutional: No distress . Vital signs reviewed. HEENT: EOMI, oral membranes moist Neck: supple Cardiovascular: RRR without murmur. No JVD    Respiratory/Chest: CTA Bilaterally without wheezes or rales. Normal effort    GI/Abdomen: BS +, non-tender, non-distended Ext: no clubbing, cyanosis, or edema Psych: pleasant and cooperative  Skin: left crani incision remains CDI with staples Neurologic: reasonable insight and awareness. motor strength is 5/5 in left 4 to 4+ right deltoid, bicep, tricep, grip, hip flexor, knee extensors, ankle dorsiflexor and plantar flexor. Good standing balance Normal sensory and cerebellar. Musculoskeletal: Full range of motion in all 4 extremities. No joint swelling, sternal, left chest pain reproducible with palpation   Assessment/Plan: 1. Functional deficits which require 3+ hours per day of interdisciplinary therapy in a comprehensive inpatient rehab setting. Physiatrist is providing close team supervision and 24 hour  management of active medical problems listed below. Physiatrist and rehab team continue to assess barriers to discharge/monitor patient progress toward functional and medical goals  Care Tool:  Bathing    Body parts bathed by patient: Right arm, Left arm, Chest, Abdomen, Front perineal area, Buttocks, Right upper leg, Left upper leg, Face, Right lower leg, Left lower leg   Body parts bathed by helper: Right lower leg, Left lower leg, Buttocks     Bathing assist Assist Level: Contact Guard/Touching assist     Upper Body Dressing/Undressing Upper body dressing   What is the patient wearing?: Pull over shirt    Upper body assist Assist Level: Set up assist    Lower Body Dressing/Undressing Lower body dressing      What is the patient wearing?: Underwear/pull up, Pants     Lower body assist Assist for lower body dressing: Contact Guard/Touching assist     Toileting Toileting Toileting Activity did not occur (Clothing management and hygiene only): N/A (no void or bm)  Toileting assist Assist for toileting: Contact Guard/Touching assist     Transfers Chair/bed transfer  Transfers assist     Chair/bed transfer assist level: Contact Guard/Touching assist     Locomotion Ambulation   Ambulation assist  Assist level: Contact Guard/Touching assist Assistive device: Walker-rolling Max distance: 200   Walk 10 feet activity   Assist     Assist level: Contact Guard/Touching assist Assistive device: Walker-rolling   Walk 50 feet activity   Assist Walk 50 feet with 2 turns activity did not occur: Safety/medical concerns (limited by lethargy/fatigue)  Assist level: Contact Guard/Touching assist Assistive device: Walker-rolling    Walk 150 feet activity   Assist Walk 150 feet activity did not occur: Safety/medical concerns  Assist level: Contact Guard/Touching assist Assistive device: Walker-rolling    Walk 10 feet on uneven surface   activity   Assist Walk 10 feet on uneven surfaces activity did not occur: Safety/medical concerns   Assist level: Contact Guard/Touching assist Assistive device: Aeronautical engineer Will patient use wheelchair at discharge?: No   Wheelchair activity did not occur: Safety/medical concerns (limited assessment at eval due to lethargy/fatigue)         Wheelchair 50 feet with 2 turns activity    Assist    Wheelchair 50 feet with 2 turns activity did not occur: Safety/medical concerns       Wheelchair 150 feet activity     Assist  Wheelchair 150 feet activity did not occur: Safety/medical concerns       Blood pressure 107/75, pulse 87, temperature 98.6 F (37 C), temperature source Oral, resp. rate 20, height 5\' 1"  (1.549 m), weight 89.9 kg, SpO2 100 %.  Medical Problem List and Plan: 1.  Decreased functional mobility with gait disturbance secondary to traumatic left SDH with 4 mm left to right midline shift as well as small right subdural hematoma status post left parietal craniotomy evacuation of subdural hematoma 08/02/2020.  Tapered off from Decadron, discussed with neurosurgery yesterday              -patient may shower             -ELOS/Goals: 08/20/20, sup to mod I goals.   -Continue CIR therapies including PT, OT, and SLP   2.  Antithrombotics: -DVT/anticoagulation: SCDs             -antiplatelet therapy: N/A 3. Pain Management/post-traumatic headache: Hydrocodone as needed  -topamax trial 25mg  bid with benefit  -tylenol prn 4. Mood: Vita motional support             -antipsychotic agents: N/A 5. Neuropsych: This patient is capable of making decisions on her own behalf. 6. Skin/Wound Care: dc staples today 7/29 7. Fluids/Electrolytes/Nutrition: pt doesn't like renal restrictions  -liberalized diet with FR 8.  Seizure prophylaxis.  Keppra 250 mg twice daily 9.  Atrial fibrillation/chest pain.  appreciate cards help.   -cardizem  adjusted to 120mg  on non-HD days and 30 on HD days  -7/29 HR, BP controlled currently  -continue monitor with therapy activities  10.  Hypotension.  ProAmatine 20 mg Monday Wednesday Friday 11.  GERD.  Protonix- needs HOB up 30 deg at all times and upright during meals and 1 hour after  12.  End-stage renal disease/hyperkalemia.  Hemodialysis per renal service.  Continue Lokelma as directed  -HD after therapy day to allow maximal participation 13.  Diastolic congestive heart failure.  Monitor for any signs of fluid overload  -weights balanced Filed Weights   08/12/20 0516 08/12/20 1241 08/13/20 0529  Weight: 90.8 kg 90.5 kg 89.9 kg     14. Anemia:   -likely acute on chronic.    -stool is OB+  -7/28  hgb 8.8---stable, continue to monitor  LOS: 6 days A FACE TO [place] 08/13/2020, 1:21 PM

## 2020-08-13 NOTE — Progress Notes (Signed)
Inpatient Rehabilitation Care Coordinator Assessment and Plan Patient Details  Name: Karen [name] MRN: [id number] Date of Birth: 10-03-1955  Today's Date: 08/13/2020  Hospital Problems: Principal Problem:   Traumatic subdural hematoma ([place]) Active Problems:   ESRD on hemodialysis ([place])   Acute on chronic anemia   Acute post-traumatic headache, not intractable  Past Medical History:  Past Medical History:  Diagnosis Date   Anemia    Arthritis    knees   Breast cancer ([place]) 2019   Right Breast Cancer   Cancer [facility])    Chest pain Jan 2016   low risk Myoview    CHF (congestive heart failure) ([place])    Colitis 12/08/2014   [facility]- focal moderate active colitis   Complication of anesthesia 2005   difficulty remembering for a while and waking up   Constipation    Dysrhythmia    h/o A-Fib   Elevated LFTs 2016   [facility]   ESRD on dialysis [facility]) April 2016   MWF [place]   Fundic gland polyps of stomach, benign    GERD (gastroesophageal reflux disease)    Heart murmur Nov 2015   Aortic scleosis- no stenosis   Hemodialysis patient ([place])    Hypertension    Shortness of breath dyspnea    with exertion   Past Surgical History:  Past Surgical History:  Procedure Laterality Date   A/V FISTULAGRAM Right 04/22/2019   Procedure: A/V FISTULAGRAM - Right Upper;  Surgeon: Serafina Mitchell, MD;  Location: [place] CV LAB;  Service: Cardiovascular;  Laterality: Right;   A/V FISTULAGRAM N/A 02/17/2020   Procedure: A/V FISTULAGRAM - Right Upper;  Surgeon: Serafina Mitchell, MD;  Location: [place] [facility];  Service: Cardiovascular;  Laterality: N/A;   ABDOMINAL HYSTERECTOMY  2005   AV FISTULA PLACEMENT Left 12/09/2013   Procedure: INSERTION OF ARTERIOVENOUS (AV) GORE-TEX GRAFT ARM;  Surgeon: Elam Dutch, MD;  Location: [place];  Service: Vascular;  Laterality: Left;   AV FISTULA PLACEMENT Right 01/01/2018   Procedure: INSERTION OF ARTERIOVENOUS (AV)  GORE-TEX GRAFT ARM RIGHT ARM;  Surgeon: Elam Dutch, MD;  Location: [place];  Service: Vascular;  Laterality: Right;   AV FISTULA PLACEMENT Right 09/17/2018   Procedure: 1ST STAGE BASILIC ARTERIOVENOUS (AV) FISTULA CREATION RIGHT ARM;  Surgeon: Waynetta Sandy, MD;  Location: [place];  Service: Vascular;  Laterality: Right;   C[place] Right 11/12/2018   Procedure: SECOND STAGE BASILIC VEIN TRANSPOSITION RIGHT ARM;  Surgeon: Waynetta Sandy, MD;  Location: [place];  Service: Vascular;  Laterality: Right;   BREAST BIOPSY  1990's   BUNIONECTOMY Bilateral    CHOLECYSTECTOMY  2005   COLONOSCOPY     CRANIOTOMY Left 08/02/2020   Procedure: CRANIOTOMY HEMATOMA EVACUATION SUBDURAL;  Surgeon: Eustace Moore, MD;  Location: [place];  Service: Neurosurgery;  Laterality: Left;   FISTULOGRAM Right 06/12/2019   Procedure: Fistulogram of right upper arm arteriovenous fistula;  Surgeon: Serafina Mitchell, MD;  Location: [facility] OR;  Service: Vascular;  Laterality: Right;   INSERTION OF ILIAC STENT  06/12/2019   Procedure: Insertion Of right basilic vein Stent;  Surgeon: Serafina Mitchell, MD;  Location: [facility] OR;  Service: Vascular;;   IR AV DIALY SHUNT INTRO [place] W/PTA/IMG RIGHT Right 03/04/2019   IR GENERIC HISTORICAL  01/11/2016   IR US GUIDE VASC ACCESS RIGHT 01/11/2016 Corrie Mckusick, DO [facility]-INTERV RAD   IR GENERIC HISTORICAL  01/11/2016   IR RADIOLOGY  PERIPHERAL GUIDED IV START 01/11/2016 Corrie Mckusick, DO [facility]-INTERV RAD   IR IMAGING GUIDED PORT INSERTION  07/05/2017   IR REMOVAL TUN CV CATH W/O FL  08/12/2019   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   KNEE ARTHROSCOPY Bilateral    MASTECTOMY W/ SENTINEL NODE BIOPSY Right 11/20/2017   MASTECTOMY W/ SENTINEL NODE BIOPSY Right 11/20/2017   Procedure: RIGHT MASTECTOMY WITH SENTINEL LYMPH NODE BIOPSY;  Surgeon: Coralie Keens, MD;  Location: [place];  Service: General;  Laterality: Right;   PERIPHERAL VASCULAR BALLOON  ANGIOPLASTY  02/17/2020   Procedure: PERIPHERAL VASCULAR BALLOON ANGIOPLASTY;  Surgeon: Serafina Mitchell, MD;  Location: [place] CV LAB;  Service: Cardiovascular;;   PERIPHERAL VASCULAR CATHETERIZATION N/A 08/31/2015   Procedure: A/V Shuntogram;  Surgeon: Serafina Mitchell, MD;  Location: [place] CV LAB;  Service: Cardiovascular;  Laterality: N/A;   PERIPHERAL VASCULAR CATHETERIZATION Left 08/31/2015   Procedure: Peripheral Vascular Balloon Angioplasty;  Surgeon: Serafina Mitchell, MD;  Location: [place] CV LAB;  Service: Cardiovascular;  Laterality: Left;  arm fistula   PERIPHERAL VASCULAR INTERVENTION Right 04/22/2019   Procedure: PERIPHERAL VASCULAR INTERVENTION;  Surgeon: Serafina Mitchell, MD;  Location: [place] CV LAB;  Service: Cardiovascular;  Laterality: Right;  FISTULA   PORT-A-CATH REMOVAL N/A 08/12/2018   Procedure: PORT-A-CATH REMOVAL;  Surgeon: Coralie Keens, MD;  Location: [place];  Service: General;  Laterality: N/A;   [place] Right 12/24/2017   Procedure: UPPER EXTREMITY VENOGRAPHY CENTRAL VENOGRAM;  Surgeon: Waynetta Sandy, MD;  Location: [place] [facility];  Service: Cardiovascular;  Laterality: Right;   UPPER EXTREMITY VENOGRAPHY Bilateral 09/02/2018   Procedure: UPPER EXTREMITY VENOGRAPHY;  Surgeon: Waynetta Sandy, MD;  Location: [place] [facility] LAB;  Service: Cardiovascular;  Laterality: Bilateral;   Social History:  reports that she has never smoked. She has never used smokeless tobacco. She reports that she does not drink alcohol and does not use drugs.  Family / Support Systems Marital Status: Married How Long?: 12 years Patient Roles: Spouse, Parent Spouse/Significant Other: Juanda Crumble (husband)- bedridden due to hx of CVA Children: blended family. pt has 2 children from previous marriage, husband has 5 children from previous marriage. Pt children Eustace Moore and Vincente Liberty (recently moved to  [facility]). Other Supports: grandson's motherPhineas Real Anticipated Caregiver: Intermittent support from pt son Eustace Moore, and [place] Ability/Limitations of Caregiver: Son works. Caregiver Availability: Intermittent Family Dynamics: Pt lives with her husband in which she is the primary caregiver. Pt drives transports self to dialysis MWF.  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 Education: some college Read: Yes Write: Yes Employment Status: Disabled Date Retired/Disabled/Unemployed: 2016 Public relations account executive Issues: Denies Guardian/Conservator: N/A   Abuse/Neglect Abuse/Neglect Assessment Can Be Completed: Yes Physical Abuse: Denies Verbal Abuse: Denies Sexual Abuse: Denies Exploitation of patient/patient's resources: Denies Self-Neglect: Denies  Emotional Status Pt's affect, behavior and adjustment status: Pt in good spirits at time of visit Recent Psychosocial Issues: Pt denies Psychiatric History: Pt admits in past she has taken medication for depression in the past but did not like how it makes her feel. Denies any hospitalizations. Substance Abuse History: Denies  Patient / Family Perceptions, Expectations & Goals Pt/Family understanding of illness & functional limitations: Pt and family have a general understanding of pt care needs Premorbid pt/family roles/activities: Independent Anticipated  changes in roles/activities/participation: Assistance with ADLs/IADLs  [employer]: None Premorbid Home Care/DME Agencies: None Transportation available at discharge: Son Eustace Moore Resource referrals recommended: Neuropsychology  Discharge Planning Living Arrangements: Spouse/significant other Support Systems: Children Type of Residence: Private residence Administrator, sports:  Multimedia programmer (specify) ([employer] Medicare) Museum/gallery curator Resources: Fish farm manager, Other (Comment) (retirement/savings) Financial Screen Referred: No Living Expenses: Medical laboratory scientific officer Management: Spouse Does the patient have any problems obtaining your medications?: No Home Management: Pt wife managed all home care needs Patient/Family Preliminary Plans: TBD Care Coordinator Barriers to Discharge: Decreased caregiver support, Lack of/limited family support Care Coordinator Anticipated Follow Up Needs: HH/OP  Clinical Impression SW met with pt in room to introduce self, explain role, and discuss discharge process. Pt is not a English as a second language teacher. HCPOAEustace Moore (son) [phone], 2) Vincente Liberty (dtr)#[phone]. Reports that she will stay on the first floor and sleep in husband's lift chair. SW discussed barriers to discharge and support she will continue to require at discharge.Supports remain limited. Pt aware SW to follow-up with her Eustace Moore.   Gustin Zobrist A Zong Mcquarrie 08/13/2020, 1:45 PM

## 2020-08-13 NOTE — Progress Notes (Signed)
Occupational Therapy Weekly Progress Note  Patient Details  Name: Karen [name] MRN: [id number] Date of Birth: [birth year]  Beginning of progress report period: August 08, 2020 End of progress report period: August 13, 2020  Today's Date: 08/13/2020 OT Individual Time: 8882-8003 OT Individual Time Calculation (min): 57 min    Patient has met 3 of 3 short term goals.  Patient is making steady progress towards OT goals. Pt is at an overall CGA level for functional transfers and BADL tasks. Pt has demonstrated improved balance and activity tolerance during functional activities.   Patient continues to demonstrate the following deficits: muscle weakness, decreased problem solving, and decreased standing balance and decreased balance strategies and therefore will continue to benefit from skilled OT intervention to enhance overall performance with BADL.  Patient progressing toward long term goals..  Continue plan of care.  OT Short Term Goals Week 1:  OT Short Term Goal 1 (Week 1): patient will complete functional transfers to/from bed, w/c and toilet with CGA OT Short Term Goal 1 - Progress (Week 1): Met OT Short Term Goal 2 (Week 1): patient will complete bathing and dressing with set up/min A using ADs as needed OT Short Term Goal 2 - Progress (Week 1): Met OT Short Term Goal 3 (Week 1): patient will complete basic meal prep/reach and transport of items with CGA OT Short Term Goal 3 - Progress (Week 1): Met Week 2:  OT Short Term Goal 1 (Week 2): LTG=STG 2/2 ELOS  Skilled Therapeutic Interventions/Progress Updates:    Patient greeted semi-reclined in bed placing lunch and dinner order. Patient able to navigate menu and pick out appropriate food items. Nursing quickly administered meds and pt agreeable to wash up and get dressed. Pt declined to shower but wanted to sponge off. Pt completed bed mobility with increased time and supervision. She then ambulate to the sink w/ RW and CGA with  verbal cues for gait speed as pt moving extremely slow. Worked on sit<>stands and standing balance/endurance within BADL tasks at the sink. Pt able to thread underwear and pant legs with min A. Continued working on standing balance/endurance with standing grooming tasks and leaning out over sink. Pt left seated in wc at end of session with alarm pad on, call bell in reach and needs met.  Therapy Documentation Precautions:  Precautions Precautions: Fall Precaution Comments: R UE fistula Restrictions Weight Bearing Restrictions: No Pain: Pain Assessment Pain Scale: 0-10 Pain Score: 0-No pain   Therapy/Group: Individual Therapy  Valma Cava 08/13/2020, 9:49 AM

## 2020-08-13 NOTE — Progress Notes (Signed)
Speech Language Pathology TBI Note  Patient Details  Name: Karen [name] MRN: [id number] Date of Birth: 09-24-1955  Today's Date: 08/13/2020 SLP Individual Time: 2376-2831 SLP Individual Time Calculation (min): 45 min  Short Term Goals: Week 1: SLP Short Term Goal 1 (Week 1): STG=LTG due to ELOS  Skilled Therapeutic Interventions:   Pt seen in room for today's tx session. SLP assessed money management/problem solving via ALFA "Counting Money". Pt completed task in 14 min; scoring a 6/10 independently. With minA verbal cueing, pt scored a 8/10. Pt required repetition and scaffolding of directions during assessment. Pt reports that she is responsible for medication and financial management for both herself and her husband who is "bedridden" according to patient. Pt appeared to lose focus/concentration throughout session; however, required minimal redirections. Most difficulty appeared to be with alternating attention. SLP rec medication management follow up. Pt left in room, sitting in chair with alarm in place. Continue with current plan of care.   Pain Pain Assessment Pain Scale: 0-10 Pain Score: 0-No pain Faces Pain Scale: No hurt  Agitated Behavior Scale: TBI Observation Details Observation Environment: Room Start of observation period - Date: 08/13/20 Start of observation period - Time: 0945 End of observation period - Date: 08/13/20 End of observation period - Time: 1030 Agitated Behavior Scale (DO NOT LEAVE BLANKS) Short attention span, easy distractibility, inability to concentrate: Absent Impulsive, impatient, low tolerance for pain or frustration: Absent Uncooperative, resistant to care, demanding: Absent Violent and/or threatening violence toward people or property: Absent Explosive and/or unpredictable anger: Absent Rocking, rubbing, moaning, or other self-stimulating behavior: Absent Pulling at tubes, restraints, etc.: Absent Wandering from treatment areas:  Absent Restlessness, pacing, excessive movement: Absent Repetitive behaviors, motor, and/or verbal: Absent Rapid, loud, or excessive talking: Absent Sudden changes of mood: Absent Easily initiated or excessive crying and/or laughter: Absent Self-abusiveness, physical and/or verbal: Absent Agitated behavior scale total score: 14  Therapy/Group: Individual Therapy  Verdene Lennert MS, CCC-SLP, CBIS  08/13/2020, 11:38 AM

## 2020-08-14 MED ORDER — DILTIAZEM HCL ER COATED BEADS 120 MG PO CP24
120.0000 mg | ORAL_CAPSULE | ORAL | Status: DC
  Administered 2020-08-14 – 2020-08-15 (×2): 120 mg via ORAL
  Filled 2020-08-14 (×2): qty 1

## 2020-08-14 NOTE — Progress Notes (Signed)
PROGRESS NOTE   Subjective/Complaints: Sleepy after working with therapy Appreciate nephrology note- tolerated dialysis on Friday well Patient's chart reviewed- No issues reported overnight  ROS: Patient denies fever, rash, sore throat, blurred vision, nausea, vomiting, diarrhea, cough, shortness of breath or chest pain, joint or back pain, headache, or mood change.   Objective:   No results found. Recent Labs    08/12/20 1200 08/13/20 1414  WBC 4.5 4.9  HGB 8.8* 8.3*  HCT 27.5* 25.5*  PLT 227 199   Recent Labs    08/12/20 1200 08/13/20 1414  NA 137 136  K 3.6 4.6  CL 97* 97*  CO2 28 29  GLUCOSE 102* 151*  BUN 40* 36*  CREATININE 8.89* 8.69*  CALCIUM 9.0 8.7*     Intake/Output Summary (Last 24 hours) at 08/14/2020 1133 Last data filed at 08/14/2020 0849 Gross per 24 hour  Intake 360 ml  Output 1473 ml  Net -1113 ml        Physical Exam: Vital Signs Blood pressure 117/64, pulse 72, temperature 97.8 F (36.6 C), resp. rate 16, height 5\' 1"  (1.549 m), weight 86.9 kg, SpO2 100 %. Gen: no distress, normal appearing HEENT: oral mucosa pink and moist, NCAT Cardio: Reg rate Chest: normal effort, normal rate of breathing Abd: soft, non-distended Ext: no edema Psych: pleasant, normal affect Skin: left crani incision remains CDI with staples Neurologic: reasonable insight and awareness. motor strength is 5/5 in left 4 to 4+ right deltoid, bicep, tricep, grip, hip flexor, knee extensors, ankle dorsiflexor and plantar flexor. Good standing balance Normal sensory and cerebellar. Musculoskeletal: Full range of motion in all 4 extremities. No joint swelling, sternal, left chest pain reproducible with palpation   Assessment/Plan: 1. Functional deficits which require 3+ hours per day of interdisciplinary therapy in a comprehensive inpatient rehab setting. Physiatrist is providing close team supervision and 24 hour  management of active medical problems listed below. Physiatrist and rehab team continue to assess barriers to discharge/monitor patient progress toward functional and medical goals  Care Tool:  Bathing    Body parts bathed by patient: Right arm, Left arm, Chest, Abdomen, Front perineal area, Buttocks, Right upper leg, Left upper leg, Face, Right lower leg, Left lower leg   Body parts bathed by helper: Right lower leg, Left lower leg, Buttocks     Bathing assist Assist Level: Supervision/Verbal cueing     Upper Body Dressing/Undressing Upper body dressing   What is the patient wearing?: Pull over shirt    Upper body assist Assist Level: Set up assist    Lower Body Dressing/Undressing Lower body dressing      What is the patient wearing?: Underwear/pull up, Pants     Lower body assist Assist for lower body dressing: Supervision/Verbal cueing     Toileting Toileting Toileting Activity did not occur (Clothing management and hygiene only): N/A (no void or bm)  Toileting assist Assist for toileting: Contact Guard/Touching assist     Transfers Chair/bed transfer  Transfers assist     Chair/bed transfer assist level: Contact Guard/Touching assist     Locomotion Ambulation   Ambulation assist      Assist level: Contact Guard/Touching assist  Assistive device: Walker-rolling Max distance: 200   Walk 10 feet activity   Assist     Assist level: Contact Guard/Touching assist Assistive device: Walker-rolling   Walk 50 feet activity   Assist Walk 50 feet with 2 turns activity did not occur: Safety/medical concerns (limited by lethargy/fatigue)  Assist level: Contact Guard/Touching assist Assistive device: Walker-rolling    Walk 150 feet activity   Assist Walk 150 feet activity did not occur: Safety/medical concerns  Assist level: Contact Guard/Touching assist Assistive device: Walker-rolling    Walk 10 feet on uneven surface  activity   Assist  Walk 10 feet on uneven surfaces activity did not occur: Safety/medical concerns   Assist level: Contact Guard/Touching assist Assistive device: Aeronautical engineer Will patient use wheelchair at discharge?: No   Wheelchair activity did not occur: Safety/medical concerns (limited assessment at eval due to lethargy/fatigue)         Wheelchair 50 feet with 2 turns activity    Assist    Wheelchair 50 feet with 2 turns activity did not occur: Safety/medical concerns       Wheelchair 150 feet activity     Assist  Wheelchair 150 feet activity did not occur: Safety/medical concerns       Blood pressure 117/64, pulse 72, temperature 97.8 F (36.6 C), resp. rate 16, height 5\' 1"  (1.549 m), weight 86.9 kg, SpO2 100 %.  Medical Problem List and Plan: 1.  Decreased functional mobility with gait disturbance secondary to traumatic left SDH with 4 mm left to right midline shift as well as small right subdural hematoma status post left parietal craniotomy evacuation of subdural hematoma 08/02/2020.  Tapered off from [place], discussed with neurosurgery yesterday              -patient may shower             -ELOS/Goals: 08/20/20, sup to mod I goals.   -Continue CIR therapies including PT, OT, and SLP   2.  Impaired mobility -DVT/anticoagulation: continue SCDs             -antiplatelet therapy: N/A 3. Pain Management/post-traumatic headache: Hydrocodone as needed  -topamax trial 25mg  bid with benefit  -tylenol prn 4. Mood: [place] motional support             -antipsychotic agents: N/A 5. Neuropsych: This patient is capable of making decisions on her own behalf. 6. Skin/Wound Care: dc staples 7/29 7. Fluids/Electrolytes/Nutrition: pt doesn't like renal restrictions  -liberalized diet with FR 8.  Seizure prophylaxis.  Keppra 250 mg twice daily 9.  Atrial fibrillation/chest pain.  appreciate cards help.   -cardizem adjusted to 120mg  on non-HD days and 30 on HD  days  -7/30 HR, BP controlled currently  -continue monitor with therapy activities  10.  Hypotension.  ProAmatine 20 mg Monday Wednesday Friday 11.  GERD.  Protonix- needs HOB up 30 deg at all times and upright during meals and 1 hour after  12.  End-stage renal disease/hyperkalemia.  Hemodialysis per renal service.  Continue Lokelma as directed  -HD after therapy day to allow maximal participation 13.  Diastolic congestive heart failure.  Monitor for any signs of fluid overload  -weights balanced Filed Weights   08/13/20 0529 08/13/20 1400 08/13/20 1720  Weight: 89.9 kg 88.4 kg 86.9 kg     14. Anemia:   -likely acute on chronic.    -stool is OB+  -7/28 hgb 8.8---stable, continue to monitor  LOS:  7 days A FACE TO FACE EVALUATION WAS PERFORMED  Clide Deutscher Augustino Savastano 08/14/2020, 11:33 AM

## 2020-08-14 NOTE — Progress Notes (Signed)
  [facility] Progress Note   Subjective:  Seen in room - no overnight issues, no CP or dyspnea. Scalp staples remain in place. Dialyzed later last night - no issues.  Objective Vitals:   08/13/20 1720 08/13/20 1852 08/13/20 1938 08/14/20 0535  BP: (!) 126/58 (!) 143/82 (!) 118/53 117/64  Pulse: 83 74 73 72  Resp: 18 19 16 16   Temp: 98.2 F (36.8 C) 98.1 F (36.7 C) 98 F (36.7 C) 97.8 F (36.6 C)  TempSrc:      SpO2:  100% 99% 100%  Weight: 86.9 kg     Height:       Physical Exam General: Well appearing woman, NAD. Staples to L scalp. Room air. Heart: RRR; no murmur Lungs: CTAB Abdomen: soft  Extremities: No LE edema Dialysis Access: RUE AVF + thrill  Additional Objective Labs: Basic Metabolic Panel: Recent Labs  Lab 08/09/20 0457 08/12/20 1200 08/13/20 1414  NA 133* 137 136  K 3.5 3.6 4.6  CL 93* 97* 97*  CO2 28 28 29   GLUCOSE 123* 102* 151*  BUN 44* 40* 36*  CREATININE 8.73* 8.89* 8.69*  CALCIUM 8.4* 9.0 8.7*  PHOS 4.6 2.4* 2.2*   Liver Function Tests: Recent Labs  Lab 08/09/20 0457 08/12/20 1200 08/13/20 1414  ALBUMIN 2.4* 2.8* 2.6*   CBC: Recent Labs  Lab 08/09/20 0457 08/10/20 0507 08/12/20 1200 08/13/20 1414  WBC 6.1  --  4.5 4.9  HGB 8.8* 9.9* 8.8* 8.3*  HCT 26.5* 30.3* 27.5* 25.5*  MCV 98.9  --  100.4* 101.6*  PLT 158  --  227 199   Medications:   atorvastatin  10 mg Oral QPM   darbepoetin (ARANESP) injection - DIALYSIS  40 mcg Intravenous Q Mon-HD   diltiazem  30 mg Oral Q M,W,F   levETIRAcetam  250 mg Oral BID   midodrine  20 mg Oral Q M,W,F-HD   multivitamin  1 tablet Oral QHS   pantoprazole  40 mg Oral BID   polyethylene glycol  17 g Oral Daily   senna  1 tablet Oral BID   sevelamer carbonate  2,400 mg Oral TID WC   topiramate  25 mg Oral BID    Dialysis Orders: [facility]  on MWF  EDW 86kg Bath 2K/2Ca 4 hrs Heparin 4000 units bolus. RUE AVF BFR 350 DFR 500    Hectoral 5 mcg IV/HD,  Cinacalcet 30 mg with HD  TIW   Assessment/Plan: Subdural hematoma L>R - s/p left parietal craniotomy and evacuation of hematoma.  S/p drain removal and staples placed.  Did note right SDH but was small. Now in CIR.  ESRD: Back to usual MWF schedule -> next HD 8/1  Hypertension/volume  - stable BP. On midodrine 20mg  qHD. Closer to EDW.  Anemia: Hgb down to 8.8,  continue Aranesp 40 q Mon. Tsat 35%, no iron needed.    Metabolic bone disease -  Ca ok, Phos low twice -> reduce Renvela to 1/meals. Nutrition - renal diet w/fluid restrictions. A fib - seen by cardiology, on cardizem.   GERD Diastolic congestive HF - fluid managed with dialysis. Repeat ECHO with EF 55-60% and indeterminiate diastolic function Dispo - To d/c on 8/4.  Veneta Penton, PA-C 08/14/2020, 10:22 AM  [employer]

## 2020-08-14 NOTE — Progress Notes (Signed)
Occupational Therapy TBI Note  Patient Details  Name: Karen [name] MRN: [id number] Date of Birth: 1955-01-23  Today's Date: 08/14/2020 OT Individual Time: [phone] OT Individual Time Calculation (min): 59 min    Short Term Goals: Week 1:  OT Short Term Goal 1 (Week 1): patient will complete functional transfers to/from bed, w/c and toilet with CGA OT Short Term Goal 1 - Progress (Week 1): Met OT Short Term Goal 2 (Week 1): patient will complete bathing and dressing with set up/min A using ADs as needed OT Short Term Goal 2 - Progress (Week 1): Met OT Short Term Goal 3 (Week 1): patient will complete basic meal prep/reach and transport of items with CGA OT Short Term Goal 3 - Progress (Week 1): Met  Skilled Therapeutic Interventions/Progress Updates:     Pt received in bed with no pain.  ADL:  Pt completes bathing with supervision using grab bar at sit to stand level and leaning forward to wash feet Pt completes UB dressing with set up seated at EOB Pt completes LB dressing with supervision to don underwear and pants STS at EOB with good pacing and safety awareenss Pt completes footwear with A to doff/don teds and set up for socks in figure 4 Pt completes shower/Tub transfer with CGA fading to Supervision level using RW and grab bar for level entry shower transfer with TTB Pt and OT discuss caretaking role for husband who will require a hoyer lift and A in bed. OT recommends A from family initally and temporary use of aids as needed to assist with care for husband. OT also discusses seated positioning for rest breaks if husband incontinent and pt needs to take break when assisting with changing at bed level. Pt verbalized understanding  Pt left at end of session in bed with exit alarm on, call light in reach and all needs met   Therapy Documentation Precautions:  Precautions Precautions: Fall Precaution Comments: R UE fistula Restrictions Weight Bearing Restrictions:  No General:   Vital Signs: Therapy Vitals Temp: 97.8 F (36.6 C) Pulse Rate: 72 Resp: 16 BP: 117/64 Patient Position (if appropriate): Lying Oxygen Therapy SpO2: 100 % O2 Device: Room Air Pain: Pain Assessment Pain Scale: 0-10 Pain Score: 0-No pain Agitated Behavior Scale: TBI  Observation Details Observation Environment: room Start of observation period - Date: 08/14/20 Start of observation period - Time: 0800 End of observation period - Date: 08/14/20 End of observation period - Time: 0900 Agitated Behavior Scale (DO NOT LEAVE BLANKS) Short attention span, easy distractibility, inability to concentrate: Absent Impulsive, impatient, low tolerance for pain or frustration: Absent Uncooperative, resistant to care, demanding: Absent Violent and/or threatening violence toward people or property: Absent Explosive and/or unpredictable anger: Absent Rocking, rubbing, moaning, or other self-stimulating behavior: Absent Pulling at tubes, restraints, etc.: Absent Wandering from treatment areas: Absent Restlessness, pacing, excessive movement: Absent Repetitive behaviors, motor, and/or verbal: Absent Rapid, loud, or excessive talking: Absent Sudden changes of mood: Absent Easily initiated or excessive crying and/or laughter: Absent Self-abusiveness, physical and/or verbal: Absent Agitated behavior scale total score: 14   Therapy/Group: Individual Therapy  Tonny Branch 08/14/2020, 9:04 AM

## 2020-08-15 MED ORDER — SEVELAMER CARBONATE 800 MG PO TABS
800.0000 mg | ORAL_TABLET | Freq: Three times a day (TID) | ORAL | Status: DC
  Administered 2020-08-15 – 2020-08-20 (×15): 800 mg via ORAL
  Filled 2020-08-15 (×15): qty 1

## 2020-08-15 NOTE — Progress Notes (Signed)
  [facility] Progress Note   Subjective:  Seen in room. Nausea this morning, feels better now. No CP or dyspnea.  Objective Vitals:   08/14/20 1311 08/14/20 1947 08/15/20 0444 08/15/20 0926  BP: 129/80 127/69 131/75 123/66  Pulse: 86 87 83 82  Resp: 17 18 18    Temp: 98.1 F (36.7 C) 99.3 F (37.4 C) 98.5 F (36.9 C)   TempSrc: Oral Oral Oral   SpO2: 100% 94% 100%   Weight:   85.8 kg   Height:       Physical Exam General: Well appearing woman, NAD. Staples to L scalp. Room air. Heart: RRR; no murmur Lungs: CTAB Abdomen: soft  Extremities: No LE edema Dialysis Access: RUE AVF + thrill   Additional Objective Labs: Basic Metabolic Panel: Recent Labs  Lab 08/09/20 0457 08/12/20 1200 08/13/20 1414  NA 133* 137 136  K 3.5 3.6 4.6  CL 93* 97* 97*  CO2 28 28 29   GLUCOSE 123* 102* 151*  BUN 44* 40* 36*  CREATININE 8.73* 8.89* 8.69*  CALCIUM 8.4* 9.0 8.7*  PHOS 4.6 2.4* 2.2*   Liver Function Tests: Recent Labs  Lab 08/09/20 0457 08/12/20 1200 08/13/20 1414  ALBUMIN 2.4* 2.8* 2.6*   CBC: Recent Labs  Lab 08/09/20 0457 08/10/20 0507 08/12/20 1200 08/13/20 1414  WBC 6.1  --  4.5 4.9  HGB 8.8* 9.9* 8.8* 8.3*  HCT 26.5* 30.3* 27.5* 25.5*  MCV 98.9  --  100.4* 101.6*  PLT 158  --  227 199   Medications:   atorvastatin  10 mg Oral QPM   darbepoetin (ARANESP) injection - DIALYSIS  40 mcg Intravenous Q Mon-HD   diltiazem  120 mg Oral Q T,Th,S,Su   diltiazem  30 mg Oral Q M,W,F   levETIRAcetam  250 mg Oral BID   midodrine  20 mg Oral Q M,W,F-HD   multivitamin  1 tablet Oral QHS   pantoprazole  40 mg Oral BID   polyethylene glycol  17 g Oral Daily   senna  1 tablet Oral BID   sevelamer carbonate  2,400 mg Oral TID WC   topiramate  25 mg Oral BID    Dialysis Orders: [place]  on MWF  EDW 86kg Bath 2K/2Ca 4 hrs Heparin 4000 units bolus. RUE AVF BFR 350 DFR 500    Hectoral 5 mcg IV/HD,  Cinacalcet 30 mg with HD TIW    Assessment/Plan: Subdural hematoma L>R - s/p left parietal craniotomy and evacuation of hematoma.  S/p drain removal and staples placed.  Did note right SDH but was small. Now in CIR.  ESRD: Back to usual MWF schedule -> next HD 8/1  Hypertension/volume  - stable BP. On midodrine 20mg  qHD. At EDW, but thinks needs to be lowered.  Anemia: Hgb down to 8.3,  continue Aranesp 40 q Mon. Tsat 35%, no iron needed.    Metabolic bone disease -  Ca ok, Phos low twice -> reduce Renvela to 1/meals. Nutrition - renal diet w/fluid restrictions. A fib - seen by cardiology, on cardizem.   GERD Diastolic congestive HF - fluid managed with dialysis. Repeat ECHO with EF 55-60% and indeterminiate diastolic function Dispo - To d/c on 8/4.  Veneta Penton, PA-C 08/15/2020, 9:32 AM  [employer]

## 2020-08-15 NOTE — Progress Notes (Signed)
Slept good. Denies pain. Left Crani C D & I with staples. PRN zofran given at 0527, for complaint of nausea, no vomiting. "I think it was the ice I ate." HOB at 50 degrees. Right AVG-+ bruit & thrill. Karen [name] A

## 2020-08-15 NOTE — Progress Notes (Signed)
PROGRESS NOTE   Subjective/Complaints: No complaints Appreciate nephrology note today Slept well last night.  Denies pain  ROS: Patient denies fever, rash, sore throat, blurred vision, nausea, vomiting, diarrhea, cough, shortness of breath or chest pain, joint or back pain, headache, or mood change, insomnia  Objective:   No results found. Recent Labs    08/13/20 1414  WBC 4.9  HGB 8.3*  HCT 25.5*  PLT 199   Recent Labs    08/13/20 1414  NA 136  K 4.6  CL 97*  CO2 29  GLUCOSE 151*  BUN 36*  CREATININE 8.69*  CALCIUM 8.7*     Intake/Output Summary (Last 24 hours) at 08/15/2020 1229 Last data filed at 08/15/2020 0900 Gross per 24 hour  Intake 840 ml  Output --  Net 840 ml        Physical Exam: Vital Signs Blood pressure 123/66, pulse 82, temperature 98.5 F (36.9 C), temperature source Oral, resp. rate 18, height 5\' 1"  (1.549 m), weight 85.8 kg, SpO2 100 %. Gen: no distress, normal appearing HEENT: oral mucosa pink and moist, NCAT Cardio: Reg rate Chest: normal effort, normal rate of breathing Abd: soft, non-distended Ext: no edema Psych: pleasant, normal affect Skin: left crani incision remains CDI with staples Neurologic: reasonable insight and awareness. motor strength is 5/5 in left 4 to 4+ right deltoid, bicep, tricep, grip, hip flexor, knee extensors, ankle dorsiflexor and plantar flexor. Good standing balance Normal sensory and cerebellar. Musculoskeletal: Full range of motion in all 4 extremities. No joint swelling, sternal, left chest pain reproducible with palpation   Assessment/Plan: 1. Functional deficits which require 3+ hours per day of interdisciplinary therapy in a comprehensive inpatient rehab setting. Physiatrist is providing close team supervision and 24 hour management of active medical problems listed below. Physiatrist and rehab team continue to assess barriers to  discharge/monitor patient progress toward functional and medical goals  Care Tool:  Bathing    Body parts bathed by patient: Right arm, Left arm, Chest, Abdomen, Front perineal area, Buttocks, Right upper leg, Left upper leg, Face, Right lower leg, Left lower leg   Body parts bathed by helper: Right lower leg, Left lower leg, Buttocks     Bathing assist Assist Level: Supervision/Verbal cueing     Upper Body Dressing/Undressing Upper body dressing   What is the patient wearing?: Pull over shirt    Upper body assist Assist Level: Set up assist    Lower Body Dressing/Undressing Lower body dressing      What is the patient wearing?: Underwear/pull up, Pants     Lower body assist Assist for lower body dressing: Supervision/Verbal cueing     Toileting Toileting Toileting Activity did not occur (Clothing management and hygiene only): N/A (no void or bm)  Toileting assist Assist for toileting: Contact Guard/Touching assist     Transfers Chair/bed transfer  Transfers assist     Chair/bed transfer assist level: Contact Guard/Touching assist     Locomotion Ambulation   Ambulation assist      Assist level: Contact Guard/Touching assist Assistive device: Walker-rolling Max distance: 200   Walk 10 feet activity   Assist     Assist level:  Contact Guard/Touching assist Assistive device: Walker-rolling   Walk 50 feet activity   Assist Walk 50 feet with 2 turns activity did not occur: Safety/medical concerns (limited by lethargy/fatigue)  Assist level: Contact Guard/Touching assist Assistive device: Walker-rolling    Walk 150 feet activity   Assist Walk 150 feet activity did not occur: Safety/medical concerns  Assist level: Contact Guard/Touching assist Assistive device: Walker-rolling    Walk 10 feet on uneven surface  activity   Assist Walk 10 feet on uneven surfaces activity did not occur: Safety/medical concerns   Assist level: Contact  Guard/Touching assist Assistive device: Aeronautical engineer Will patient use wheelchair at discharge?: No   Wheelchair activity did not occur: Safety/medical concerns (limited assessment at eval due to lethargy/fatigue)         Wheelchair 50 feet with 2 turns activity    Assist    Wheelchair 50 feet with 2 turns activity did not occur: Safety/medical concerns       Wheelchair 150 feet activity     Assist  Wheelchair 150 feet activity did not occur: Safety/medical concerns       Blood pressure 123/66, pulse 82, temperature 98.5 F (36.9 C), temperature source Oral, resp. rate 18, height 5\' 1"  (1.549 m), weight 85.8 kg, SpO2 100 %.  Medical Problem List and Plan: 1.  Decreased functional mobility with gait disturbance secondary to traumatic left SDH with 4 mm left to right midline shift as well as small right subdural hematoma status post left parietal craniotomy evacuation of subdural hematoma 08/02/2020.  Tapered off from Decadron, discussed with neurosurgery yesterday              -patient may shower             -ELOS/Goals: 08/20/20, sup to mod I goals.   -Continue CIR therapies including PT, OT, and SLP   2.  Impaired mobility -DVT/anticoagulation: continue SCDs             -antiplatelet therapy: N/A 3. Pain Management/post-traumatic headache: Hydrocodone as needed  -continue topamax trial 25mg  bid with benefit  -tylenol prn 4. Mood: Vita motional support             -antipsychotic agents: N/A 5. Neuropsych: This patient is capable of making decisions on her own behalf. 6. Skin/Wound Care: dc staples 7/29 7. Fluids/Electrolytes/Nutrition: pt doesn't like renal restrictions  -liberalized diet with FR 8.  Seizure prophylaxis.  Keppra 250 mg twice daily 9.  Atrial fibrillation/chest pain.  appreciate cards help.   -cardizem adjusted to 120mg  on non-HD days and 30 on HD days  -7/31 HR, BP controlled currently  -continue monitor with therapy  activities  10.  Hypotension.  ProAmatine 20 mg Monday Wednesday Friday 11.  GERD.  Protonix- needs HOB up 30 deg at all times and upright during meals and 1 hour after  12.  End-stage renal disease/hyperkalemia.  Hemodialysis per renal service.  Continue Lokelma as directed  -HD after therapy day to allow maximal participation 13.  Diastolic congestive heart failure.  Monitor for any signs of fluid overload  -weights balanced  7/31: weight down, continue to monitor Filed Weights   08/13/20 1400 08/13/20 1720 08/15/20 0444  Weight: 88.4 kg 86.9 kg 85.8 kg     14. Anemia:   -likely acute on chronic.    -stool is OB+  -7/28 hgb 8.8---stable, continue to monitor  LOS: 8 days A FACE TO FACE EVALUATION WAS PERFORMED  Tanisha Clan P Eilyn Polack 08/15/2020, 12:29 PM

## 2020-08-16 LAB — CBC
HCT: 27 % — ABNORMAL LOW (ref 36.0–46.0)
Hemoglobin: 8.9 g/dL — ABNORMAL LOW (ref 12.0–15.0)
MCH: 33.5 pg (ref 26.0–34.0)
MCHC: 33 g/dL (ref 30.0–36.0)
MCV: 101.5 fL — ABNORMAL HIGH (ref 80.0–100.0)
Platelets: 237 10*3/uL (ref 150–400)
RBC: 2.66 MIL/uL — ABNORMAL LOW (ref 3.87–5.11)
RDW: 14.4 % (ref 11.5–15.5)
WBC: 4.2 10*3/uL (ref 4.0–10.5)
nRBC: 0 % (ref 0.0–0.2)

## 2020-08-16 LAB — RENAL FUNCTION PANEL
Albumin: 3 g/dL — ABNORMAL LOW (ref 3.5–5.0)
Anion gap: 13 (ref 5–15)
BUN: 50 mg/dL — ABNORMAL HIGH (ref 8–23)
CO2: 27 mmol/L (ref 22–32)
Calcium: 9.3 mg/dL (ref 8.9–10.3)
Chloride: 96 mmol/L — ABNORMAL LOW (ref 98–111)
Creatinine, Ser: 10.53 mg/dL — ABNORMAL HIGH (ref 0.44–1.00)
GFR, Estimated: 4 mL/min — ABNORMAL LOW (ref >60)
Glucose, Bld: 103 mg/dL — ABNORMAL HIGH (ref 70–99)
Phosphorus: 3.8 mg/dL (ref 2.5–4.6)
Potassium: 5.9 mmol/L — ABNORMAL HIGH (ref 3.5–5.1)
Sodium: 136 mmol/L (ref 135–145)

## 2020-08-16 MED ORDER — DILTIAZEM HCL 60 MG PO TABS
30.0000 mg | ORAL_TABLET | ORAL | Status: DC
  Administered 2020-08-16 – 2020-08-18 (×2): 30 mg via ORAL
  Filled 2020-08-16 (×2): qty 1

## 2020-08-16 MED ORDER — DILTIAZEM HCL ER COATED BEADS 120 MG PO CP24: 120.0000 mg | ORAL_CAPSULE | ORAL | Status: DC

## 2020-08-16 MED ORDER — DARBEPOETIN ALFA 100 MCG/0.5ML IJ SOSY: 100.0000 ug | PREFILLED_SYRINGE | INTRAMUSCULAR | Status: DC

## 2020-08-16 MED ORDER — TOPIRAMATE 25 MG PO TABS
50.0000 mg | ORAL_TABLET | Freq: Two times a day (BID) | ORAL | Status: DC
  Administered 2020-08-16 – 2020-08-20 (×8): 50 mg via ORAL
  Filled 2020-08-16 (×8): qty 2

## 2020-08-16 MED ORDER — DILTIAZEM HCL 60 MG PO TABS: 30.0000 mg | ORAL_TABLET | ORAL | Status: DC

## 2020-08-16 MED ORDER — DILTIAZEM HCL ER COATED BEADS 120 MG PO CP24
120.0000 mg | ORAL_CAPSULE | ORAL | Status: DC
  Administered 2020-08-17 – 2020-08-19 (×2): 120 mg via ORAL
  Filled 2020-08-16 (×2): qty 1

## 2020-08-16 NOTE — Progress Notes (Signed)
Physical Therapy Weekly Progress Note  Patient Details  Name: Delainy Mcelhiney MRN: [id number] Date of Birth: 11-15-1955  Beginning of progress report period: August 08, 2020 End of progress report period: August 16, 2020  Today's Date: 08/16/2020      Patient has met 3 of 4 short term goals.  Patient currently performs mobility with supervision-CGA ambulating >250 feet with RW and 8 steps with 1 rail. Patient is progressing well towards LTGs and will be prepared for discharge later this week.   Patient continues to demonstrate the following deficits muscle weakness, decreased cardiorespiratoy endurance, and decreased standing balance, decreased postural control, and decreased balance strategies and therefore will continue to benefit from skilled PT intervention to increase functional independence with mobility.  Patient progressing toward long term goals.  Continue plan of care.  PT Short Term Goals Week 1:  PT Short Term Goal 1 (Week 1): Patient will complete standardized balance assessment. PT Short Term Goal 2 (Week 1): Patient will perform basic transfers with close supervision >75% of the time using LRAD. PT Short Term Goal 3 (Week 1): Patient will ambulate >250 ft using LRAD with CGA. PT Short Term Goal 4 (Week 1): Patient will perform at least 4 steps with 1 rail with min A.  Therapy Documentation Precautions:  Precautions Precautions: Fall Precaution Comments: R UE fistula Restrictions Weight Bearing Restrictions: No   Therapy/Group: Individual Therapy  Versa Craton L Karita Dralle PT, DPT  08/16/2020, 4:47 PM

## 2020-08-16 NOTE — Progress Notes (Signed)
Occupational Therapy TBI Note  Patient Details  Name: Karen [name] MRN: [id number] Date of Birth: Nov 29, 1955  Today's Date: 08/16/2020 OT Individual Time: 1104-1200 OT Individual Time Calculation (min): 56 min  and Today's Date: 08/16/2020 OT Missed Time: 30 Minutes Missed Time Reason: Patient fatigue (headache)   Short Term Goals: Week 2:  OT Short Term Goal 1 (Week 2): LTG=STG 2/2 ELOS  Skilled Therapeutic Interventions/Progress Updates:  Pt greeted for initial OT session this morning. Pt reported fatigue, headache, and would not open her eyes. Pt declined to participate. OT followed up later in the morning.    Pt greeted seated in wc and agreeable to OT treatment session. Pt in a good mood and eager to shower. Pt ambulated throughout session with RW and CGA. Pt with some decreased safety awareness requiring verbal cues at times. Bathing completed from tub bench with set-up and CGA when standing to wash buttocks. Dressing sit<>stand with min A. OT educated on friction reducing bag to don TED hose, but pt able to don her socks. Worked on standing balance/endurance with standing grooming tasks and supervision. Pt left seated in wc at end of session with chair alarm on, call bell in reach, and needs met.   Therapy Documentation Precautions:  Precautions Precautions: Fall Precaution Comments: R UE fistula Restrictions Weight Bearing Restrictions: No Pain: Pain Assessment Pain Scale: 0-10 Pain Score: 3  Pain Type: Acute pain Pain Location: Head Pain Descriptors / Indicators: Headache Pain Frequency: Intermittent Pain Onset: On-going Patients Stated Pain Goal: 0 Pain Intervention(s): Repositioned Agitated Behavior Scale: TBI Observation Details Observation Environment: CIR Start of observation period - Date: 08/16/20 Start of observation period - Time: 1100 End of observation period - Date: 08/16/20 End of observation period - Time: 1200 Agitated Behavior Scale (DO NOT  LEAVE BLANKS) Short attention span, easy distractibility, inability to concentrate: Absent Impulsive, impatient, low tolerance for pain or frustration: Absent Uncooperative, resistant to care, demanding: Absent Violent and/or threatening violence toward people or property: Absent Explosive and/or unpredictable anger: Absent Rocking, rubbing, moaning, or other self-stimulating behavior: Absent Pulling at tubes, restraints, etc.: Absent Wandering from treatment areas: Absent Restlessness, pacing, excessive movement: Absent Repetitive behaviors, motor, and/or verbal: Absent Rapid, loud, or excessive talking: Absent Sudden changes of mood: Absent Easily initiated or excessive crying and/or laughter: Absent Self-abusiveness, physical and/or verbal: Absent Agitated behavior scale total score: 14    Therapy/Group: Individual Therapy  Valma Cava 08/16/2020, 11:27 AM

## 2020-08-16 NOTE — Procedures (Signed)
Patient was seen on dialysis and the procedure was supervised.  BFR 400  Via AVF BP is  119/77.   Patient appears to be tolerating treatment well  Louis Meckel 08/16/2020

## 2020-08-16 NOTE — Progress Notes (Signed)
SLP Cancellation Note  Patient Details Name: Jelani Vreeland MRN: [id number] DOB: 01/19/1955   Cancelled treatment:       Patient missed 45 minutes of skilled SLP intervention due to fatigue and headache. Physician aware and ordered a CT scan. Will re-attempt as able.                                                                                                 Glinda Natzke 08/16/2020, 1:55 PM

## 2020-08-16 NOTE — Progress Notes (Signed)
Patient reports feeling "tired". Refused supper, only drank 120cc's of water with meds. Denied nausea. "My headache is almost gone."  PRN norco given at 1453 & PRN tylenol given at 1810. No PRN meds given or requested thus far this shift. Right AVG-++. Left Crani with staples. Flat affect, seems withdrawn tonight. "I'm fine." Requesting later therapies. Karen [name] A

## 2020-08-16 NOTE — Progress Notes (Addendum)
[facility] Progress Note   Subjective: Seen in room up in WC. After multiple attempts at asking how she is doing (Pt NEVER complains!) , she finally acknowledges that she had severe HA yesterday, still has it today but not as bad. Going for CT later today. HD later this PM. Seen on HD-  playing on her phone  Objective Vitals:   08/15/20 0926 08/15/20 1335 08/15/20 1933 08/16/20 0517  BP: 123/66 111/62 139/66 119/70  Pulse: 82 83 84 77  Resp:  17 18 18   Temp:  98.8 F (37.1 C) 98.6 F (37 C) 97.7 F (36.5 C)  TempSrc:  Oral Oral Oral  SpO2:  98% 97% 97%  Weight:    84.6 kg  Height:       Physical Exam General: Very pleasant older female in NAD HEENT: PERRLA. Sutures to L scalp Heart: S1,S2, No M/R/G Lungs: CTAB Abdomen: NABS Extremities: No LE edema. Compression hose in use.  Dialysis Access: R AVF+T/B   Additional Objective Labs: Basic Metabolic Panel: Recent Labs  Lab 08/12/20 1200 08/13/20 1414  NA 137 136  K 3.6 4.6  CL 97* 97*  CO2 28 29  GLUCOSE 102* 151*  BUN 40* 36*  CREATININE 8.89* 8.69*  CALCIUM 9.0 8.7*  PHOS 2.4* 2.2*   Liver Function Tests: Recent Labs  Lab 08/12/20 1200 08/13/20 1414  ALBUMIN 2.8* 2.6*   No results for input(s): LIPASE, AMYLASE in the last 168 hours. CBC: Recent Labs  Lab 08/10/20 0507 08/12/20 1200 08/13/20 1414  WBC  --  4.5 4.9  HGB 9.9* 8.8* 8.3*  HCT 30.3* 27.5* 25.5*  MCV  --  100.4* 101.6*  PLT  --  227 199   Blood Culture    Component Value Date/Time   SDES BLOOD LEFT HAND 01/07/2018 0110   SPECREQUEST  01/07/2018 0110    BOTTLES DRAWN AEROBIC ONLY Blood Culture results may not be optimal due to an excessive volume of blood received in culture bottles   CULT  01/07/2018 0110    NO GROWTH 5 DAYS Performed at [facility], [place] [address], [place], [place] [address]    REPTSTATUS 01/12/2018 FINAL 01/07/2018 0110    Cardiac Enzymes: No results for input(s): CKTOTAL, CKMB,  CKMBINDEX, TROPONINI in the last 168 hours. CBG: No results for input(s): GLUCAP in the last 168 hours. Iron Studies: No results for input(s): IRON, TIBC, TRANSFERRIN, FERRITIN in the last 72 hours. @lablastinr3 @ Studies/Results: No results found. Medications:   atorvastatin  10 mg Oral QPM   darbepoetin (ARANESP) injection - DIALYSIS  40 mcg Intravenous Q Mon-HD   [START ON 08/17/2020] diltiazem  120 mg Oral Q T,Th,S,Su-1800   And   diltiazem  30 mg Oral Q M,W,F-1800   levETIRAcetam  250 mg Oral BID   midodrine  20 mg Oral Q M,W,F-HD   multivitamin  1 tablet Oral QHS   pantoprazole  40 mg Oral BID   polyethylene glycol  17 g Oral Daily   senna  1 tablet Oral BID   sevelamer carbonate  800 mg Oral TID WC   topiramate  50 mg Oral BID     Dialysis Orders: [place]  on MWF  EDW 86kg Bath 2K/2Ca 4 hrs Heparin 4000 units bolus. RUE AVF BFR 350 DFR 500    Hectoral 5 mcg IV/HD,  Cinacalcet 30 mg with HD TIW   Assessment/Plan: Subdural hematoma L>R - s/p left parietal craniotomy and evacuation of hematoma.  S/p drain  removal and staples placed.  Did note right SDH but was small. Now in CIR. Recurrent HA. Going for CT today.   ESRD: Back to usual MWF schedule via AVF -> next HD 8/1  Hypertension/volume  - stable BP. On midodrine 20mg  qHD. At EDW, but thinks needs to be lowered.  Anemia: Hgb down to 8.3,  Increase Aranesp 100 mcg IV q Mon. Tsat 35%, no iron needed.    Metabolic bone disease -  Ca ok, Phos low twice -> reduce Renvela to 1/meals. Hectorol and sensipar not on medication list. Check PTH today.  Nutrition - renal diet w/fluid restrictions. A fib - seen by cardiology, on cardizem.   GERD Diastolic congestive HF - fluid managed with dialysis. Repeat ECHO with EF 55-60% and indeterminiate diastolic function Dispo - To d/c on 8/4.  Rita H. Brown NP-C 08/16/2020, 12:11 PM  [facility] [phone]  Patient seen and examined, agree with above note with above  modifications. Seen on HD-  no new c/o's-  cont with maintenance HD mwf and appropriate titration of dialysis related meds.  Possible discharge this Thursday  Corliss Parish, MD 08/16/2020

## 2020-08-16 NOTE — Progress Notes (Signed)
PROGRESS NOTE   Subjective/Complaints: Was in bed most of yesterday with worsening headache. Still has one this morning. Unable to sleep.  Rn reports some anxiety/depressive behaviors  ROS: Limited due to cognitive/behavioral   Objective:   No results found. Recent Labs    08/13/20 1414  WBC 4.9  HGB 8.3*  HCT 25.5*  PLT 199   Recent Labs    08/13/20 1414  NA 136  K 4.6  CL 97*  CO2 29  GLUCOSE 151*  BUN 36*  CREATININE 8.69*  CALCIUM 8.7*     Intake/Output Summary (Last 24 hours) at 08/16/2020 0929 Last data filed at 08/16/2020 0736 Gross per 24 hour  Intake 480 ml  Output --  Net 480 ml        Physical Exam: Vital Signs Blood pressure 119/70, pulse 77, temperature 97.7 F (36.5 C), temperature source Oral, resp. rate 18, height 5\' 1"  (1.549 m), weight 84.6 kg, SpO2 97 %. Constitutional: No distress . Vital signs reviewed. HEENT: EOMI, oral membranes moist Neck: supple Cardiovascular: RRR without murmur. No JVD    Respiratory/Chest: CTA Bilaterally without wheezes or rales. Normal effort    GI/Abdomen: BS +, non-tender, non-distended Ext: no clubbing, cyanosis, or edema Psych: pleasant and cooperative  Skin: left crani incision remains CDI with staples Neurologic: reasonable insight and awareness. motor strength is 5/5 in left 4 to 4+ right deltoid, bicep, tricep, grip, hip flexor, knee extensors, ankle dorsiflexor and plantar flexor. Good standing balance Normal sensory and cerebellar. Musculoskeletal: Full range of motion in all 4 extremities. No joint swelling, sternal, left chest pain reproducible with palpation   Assessment/Plan: 1. Functional deficits which require 3+ hours per day of interdisciplinary therapy in a comprehensive inpatient rehab setting. Physiatrist is providing close team supervision and 24 hour management of active medical problems listed below. Physiatrist and rehab team  continue to assess barriers to discharge/monitor patient progress toward functional and medical goals  Care Tool:  Bathing    Body parts bathed by patient: Right arm, Left arm, Chest, Abdomen, Front perineal area, Buttocks, Right upper leg, Left upper leg, Face, Right lower leg, Left lower leg   Body parts bathed by helper: Right lower leg, Left lower leg, Buttocks     Bathing assist Assist Level: Supervision/Verbal cueing     Upper Body Dressing/Undressing Upper body dressing   What is the patient wearing?: Pull over shirt    Upper body assist Assist Level: Set up assist    Lower Body Dressing/Undressing Lower body dressing      What is the patient wearing?: Underwear/pull up, Pants     Lower body assist Assist for lower body dressing: Supervision/Verbal cueing     Toileting Toileting Toileting Activity did not occur (Clothing management and hygiene only): N/A (no void or bm)  Toileting assist Assist for toileting: Contact Guard/Touching assist     Transfers Chair/bed transfer  Transfers assist     Chair/bed transfer assist level: Contact Guard/Touching assist     Locomotion Ambulation   Ambulation assist      Assist level: Contact Guard/Touching assist Assistive device: Walker-rolling Max distance: 200   Walk 10 feet activity  Assist     Assist level: Contact Guard/Touching assist Assistive device: Walker-rolling   Walk 50 feet activity   Assist Walk 50 feet with 2 turns activity did not occur: Safety/medical concerns (limited by lethargy/fatigue)  Assist level: Contact Guard/Touching assist Assistive device: Walker-rolling    Walk 150 feet activity   Assist Walk 150 feet activity did not occur: Safety/medical concerns  Assist level: Contact Guard/Touching assist Assistive device: Walker-rolling    Walk 10 feet on uneven surface  activity   Assist Walk 10 feet on uneven surfaces activity did not occur: Safety/medical  concerns   Assist level: Contact Guard/Touching assist Assistive device: Aeronautical engineer Will patient use wheelchair at discharge?: No   Wheelchair activity did not occur: Safety/medical concerns (limited assessment at eval due to lethargy/fatigue)         Wheelchair 50 feet with 2 turns activity    Assist    Wheelchair 50 feet with 2 turns activity did not occur: Safety/medical concerns       Wheelchair 150 feet activity     Assist  Wheelchair 150 feet activity did not occur: Safety/medical concerns       Blood pressure 119/70, pulse 77, temperature 97.7 F (36.5 C), temperature source Oral, resp. rate 18, height 5\' 1"  (1.549 m), weight 84.6 kg, SpO2 97 %.  Medical Problem List and Plan: 1.  Decreased functional mobility with gait disturbance secondary to traumatic left SDH with 4 mm left to right midline shift as well as small right subdural hematoma status post left parietal craniotomy evacuation of subdural hematoma 08/02/2020.  Tapered off from Decadron, discussed with neurosurgery yesterday              -patient may shower             -ELOS/Goals: 08/20/20, sup to mod I goals.   -Continue CIR therapies including PT, OT, and SLP    -pt with increased headaches and lethargy. May have behavioral component, too. Check HCT 2.  Impaired mobility -DVT/anticoagulation: continue SCDs             -antiplatelet therapy: N/A 3. Pain Management/post-traumatic headache: Hydrocodone as needed  -continue topamax trial 25mg  bid with benefit---increase to 50mg  bid  -tylenol prn 4. Mood: Vita motional support             -antipsychotic agents: N/A 5. Neuropsych: This patient is capable of making decisions on her own behalf. 6. Skin/Wound Care: dc staples 7/29 7. Fluids/Electrolytes/Nutrition: pt doesn't like renal restrictions  -liberalized diet with FR 8.  Seizure prophylaxis.  Keppra 250 mg twice daily 9.  Atrial fibrillation/chest pain.   appreciate cards help.   -cardizem adjusted to 120mg  on non-HD days and 30 on HD days  -8/1 HR, BP controlled currently  -continue monitor with therapy activities  10.  Hypotension.  ProAmatine 20 mg Monday Wednesday Friday 11.  GERD.  Protonix- needs HOB up 30 deg at all times and upright during meals and 1 hour after  12.  End-stage renal disease/hyperkalemia.  Hemodialysis per renal service.  Continue Lokelma as directed  -HD after therapy day to allow maximal participation 13.  Diastolic congestive heart failure.  Monitor for any signs of fluid overload  -weights balanced    weight down, continue to monitor Filed Weights   08/13/20 1720 08/15/20 0444 08/16/20 0517  Weight: 86.9 kg 85.8 kg 84.6 kg     14. Anemia:   -likely acute on chronic.    -  stool is OB+  -7/29 8.3  LOS: 9 days A FACE TO FACE EVALUATION WAS PERFORMED  Meredith Staggers 08/16/2020, 9:29 AM

## 2020-08-16 NOTE — Progress Notes (Signed)
PRN tylenol given at 0321 for C/O HA. Talked with patient about depression. "All they will do is give me more meds." "Going home is like prison, because I'm stuck in the house and can't go anywhere." Upset about current situation. Kept repeating-"i'm just tired, I'm just tired." Emotional support provided. Karen [name] A

## 2020-08-16 NOTE — Progress Notes (Signed)
Physical Therapy TBI Note  Patient Details  Name: Karen [name] MRN: [id number] Date of Birth: Mar 25, 1955  Today's Date: 08/16/2020 PT Individual Time: 0950-1030 PT Individual Time Calculation (min): 40 min   Short Term Goals: Week 2:  PT Short Term Goal 1 (Week 2): STG=LTG due to ELOS.  Skilled Therapeutic Interventions/Progress Updates:     Patient in bed upon PT arrival. Patient alert and agreeable to PT session. Patient denied pain during session.  Discussed increased headaches over the weekend and lack of activity. Patient stated that she felt down from having nothing to do all weekend and thinking about her concerns over caring for her husband at d/c. Educated on working with family to set-up a schedule to assist her and her husband and reaching out to locate a home health aid to assist. Patient reports that she has tried this in the past and was unable to afford to hire anyone.   Discussed activities the patient enjoys and options for energy conserving activities such as brain teasers, books, word searches, puzzles, etc. Attempted to locate a word search, will reach out to RT for recommendations.   Therapeutic Activity: Bed Mobility: Patient performed supine to sit with mod I with HOB elevated.  Transfers: Patient performed sit to/from stand x2 with supervision using RW. Provided verbal cues for controlled descent.  Gait Training:  Patient ambulated >250 feet using RW with CGA-close supervision with w/c follow for safety. Ambulated with decreased gait speed, decreased step length and height, increased B hip and knee flexion in stance, forward trunk lean, and intermittent downward head gaze. Provided verbal cues for erect posture, increased step height, shoulder depression.  Patient in w/c in the room in better spirits at end of session with breaks locked, chair alarm set, and all needs within reach.   Therapy Documentation Precautions:  Precautions Precautions:  Fall Precaution Comments: R UE fistula Restrictions Weight Bearing Restrictions: No  Agitated Behavior Scale: TBI Observation Details Observation Environment: CIR Start of observation period - Date: 08/16/20 Start of observation period - Time: 0950 End of observation period - Date: 08/16/20 End of observation period - Time: 1030 Agitated Behavior Scale (DO NOT LEAVE BLANKS) Short attention span, easy distractibility, inability to concentrate: Absent Impulsive, impatient, low tolerance for pain or frustration: Absent Uncooperative, resistant to care, demanding: Absent Violent and/or threatening violence toward people or property: Absent Explosive and/or unpredictable anger: Absent Rocking, rubbing, moaning, or other self-stimulating behavior: Absent Pulling at tubes, restraints, etc.: Absent Wandering from treatment areas: Absent Restlessness, pacing, excessive movement: Absent Repetitive behaviors, motor, and/or verbal: Absent Rapid, loud, or excessive talking: Absent Sudden changes of mood: Absent Easily initiated or excessive crying and/or laughter: Absent Self-abusiveness, physical and/or verbal: Absent Agitated behavior scale total score: 14    Therapy/Group: Individual Therapy  Bonham Zingale L Deverick Pruss PT, DPT  08/16/2020, 12:57 PM

## 2020-08-17 ENCOUNTER — Inpatient Hospital Stay (HOSPITAL_COMMUNITY): Payer: Medicare Other

## 2020-08-17 LAB — PARATHYROID HORMONE, INTACT (NO CA): PTH: 140 pg/mL — ABNORMAL HIGH (ref 15–65)

## 2020-08-17 MED ORDER — SENNA 8.6 MG PO TABS
1.0000 | ORAL_TABLET | Freq: Two times a day (BID) | ORAL | 0 refills | Status: DC
Start: 2020-08-17 — End: 2020-09-14

## 2020-08-17 MED ORDER — ACETAMINOPHEN 325 MG PO TABS: 650.0000 mg | ORAL_TABLET | ORAL | Status: DC | PRN

## 2020-08-17 MED ORDER — DARBEPOETIN ALFA 100 MCG/0.5ML IJ SOSY
100.0000 ug | PREFILLED_SYRINGE | INTRAMUSCULAR | Status: DC
  Administered 2020-08-19: 100 ug via INTRAVENOUS

## 2020-08-17 MED ORDER — ONDANSETRON HCL 8 MG PO TABS: 8.0000 mg | ORAL_TABLET | Freq: Three times a day (TID) | ORAL | 0 refills | Status: AC | PRN

## 2020-08-17 MED ORDER — CHLORHEXIDINE GLUCONATE CLOTH 2 % EX PADS: 6.0000 | MEDICATED_PAD | Freq: Every day | CUTANEOUS | Status: DC

## 2020-08-17 MED ORDER — POLYETHYLENE GLYCOL 3350 17 G PO PACK: 17.0000 g | PACK | Freq: Every day | ORAL | 0 refills | Status: DC

## 2020-08-17 NOTE — Progress Notes (Signed)
Physical Therapy TBI Note  Patient Details  Name: Karen [name] MRN: [id number] Date of Birth: 05/25/1955  Today's Date: 08/17/2020 PT Individual Time: [phone] and 0350-0938 PT Individual Time Calculation (min): 55 min and 38 min  Short Term Goals: Week 2:  PT Short Term Goal 1 (Week 2): STG=LTG due to ELOS.  Skilled Therapeutic Interventions/Progress Updates:     1st Session: Pt received supine in bed and agrees to therapy. No complaint of pain. Bed mobility with bed features and PT cues on positioning at EOB. PT provides totalA to don TED hose at EOB, as well as shoes for time management. Pt ambulates multiple bouts during session, working on balance, gait pattern, and endurance. PT provides CGA. Cues provided for upright gaze to improve posture and balance, increasing bilateral stride length to decrease risk for falls. Pt has x1 LOB during 180 degree turn in the hallway, requiring minA to correct. PT has pt perform several more 180 degree turns and pt demos improved sequencing and balance. Pt ambulates bouts of 200', 300', and 200', with extended seated rest breaks in between bouts. Pt also completes x16 6" steps with L hand rail and CGA, per home setup. Pt requesting to return to bed following session. Sit to supine with bed features. Left with alarm intact and all needs within reach.  2nd Session: Pt received seated in [facility] and agrees to therapy. No complaint of pain but does mention significant fatigue. WC transport to gym for energy conservation. Session used to practice hoyer transfer with "dummy" to simulate home situation. 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 PT sets up dummy on high low table and pt cued to use hoyer to move dummy from from mat to [facility]. PT provides cues on use of hoyer as it is not exactly the same as pt's home lift, and otherwise pt requires CGA to complete.  Cues also provided for orientation of pt relative to lift for safety. Pt independently locks breaks and rolls pt to position sling, however. Once in sling, 50lbs added to more closely simulate weight of husband. Pt is able to maneuver lift with PT providing CGA, and positions lift safely to lower dummy into chair. Following seated rest break, pt transfers dummy back to mat table. Pt takes side steps, backward steps, turns, and pivots during training, and takes several rest breaks, with PT educating on importance of energy conservation. WC transport back to room. Stand step transfer to bed with CGA. Left supine with alarm intact and all needs within reach.  Therapy Documentation Precautions:  Precautions Precautions: Fall Precaution Comments: R UE fistula Restrictions Weight Bearing Restrictions: No Agitated Behavior Scale: TBI Observation Details Observation Environment: Patient's room Start of observation period - Date: 08/17/20 Start of observation period - Time: 1400 End of observation period - Date: 08/17/20 End of observation period - Time: 1430 Agitated Behavior Scale (DO NOT LEAVE BLANKS) Short attention span, easy distractibility, inability to concentrate: Absent Impulsive, impatient, low tolerance for pain or frustration: Absent Uncooperative, resistant to care, demanding: Absent Violent and/or threatening violence toward people or property: Absent Explosive and/or unpredictable anger: Absent Rocking, rubbing, moaning, or other self-stimulating behavior: Absent Pulling at tubes, restraints, etc.: Absent Wandering from treatment areas: Absent Restlessness, pacing, excessive movement: Absent Repetitive behaviors, motor, and/or verbal: Absent Rapid, loud, or excessive talking: Absent Sudden changes of mood: Absent Easily initiated or excessive crying and/or laughter:  Absent Self-abusiveness, physical and/or verbal: Absent Agitated behavior scale total score:  14   Therapy/Group: Individual Therapy  Breck Coons, PT, DPT 08/17/2020, 4:11 PM

## 2020-08-17 NOTE — Progress Notes (Signed)
[facility] Progress Note   Subjective:  HD yest-  removed 2900-  tolerated well -- had HCT- no new bleed but maybe a seroma, she reports headache is better-  still on track for Friday discharge after HD   Objective Vitals:   08/16/20 1655 08/16/20 1720 08/16/20 1948 08/17/20 0508  BP: 130/77 (!) 141/75 (!) 109/52 116/66  Pulse: 69 75 76 80  Resp: 16 20 18 20   Temp: 98.5 F (36.9 C) 98 F (36.7 C) 98.4 F (36.9 C) 98.8 F (37.1 C)  TempSrc:  Oral Oral Oral  SpO2:  100% 99% 98%  Weight:    89.2 kg  Height:       Physical Exam General: Very pleasant older female in NAD HEENT: PERRLA. Sutures to L scalp Heart: S1,S2, No M/R/G Lungs: CTAB Abdomen: NABS Extremities: No LE edema. Compression hose in use.  Dialysis Access: R AVF+T/B   Additional Objective Labs: Basic Metabolic Panel: Recent Labs  Lab 08/12/20 1200 08/13/20 1414 08/16/20 1355  NA 137 136 136  K 3.6 4.6 5.9*  CL 97* 97* 96*  CO2 28 29 27   GLUCOSE 102* 151* 103*  BUN 40* 36* 50*  CREATININE 8.89* 8.69* 10.53*  CALCIUM 9.0 8.7* 9.3  PHOS 2.4* 2.2* 3.8   Liver Function Tests: Recent Labs  Lab 08/12/20 1200 08/13/20 1414 08/16/20 1355  ALBUMIN 2.8* 2.6* 3.0*   No results for input(s): LIPASE, AMYLASE in the last 168 hours. CBC: Recent Labs  Lab 08/12/20 1200 08/13/20 1414 08/16/20 1354  WBC 4.5 4.9 4.2  HGB 8.8* 8.3* 8.9*  HCT 27.5* 25.5* 27.0*  MCV 100.4* 101.6* 101.5*  PLT 227 199 237   Blood Culture    Component Value Date/Time   SDES BLOOD LEFT HAND 01/07/2018 0110   SPECREQUEST  01/07/2018 0110    BOTTLES DRAWN AEROBIC ONLY Blood Culture results may not be optimal due to an excessive volume of blood received in culture bottles   CULT  01/07/2018 0110    NO GROWTH 5 DAYS Performed at [facility] [address], [place], [place] [address]    REPTSTATUS 01/12/2018 FINAL 01/07/2018 0110    Cardiac Enzymes: No results for input(s): CKTOTAL, CKMB,  CKMBINDEX, TROPONINI in the last 168 hours. CBG: No results for input(s): GLUCAP in the last 168 hours. Iron Studies: No results for input(s): IRON, TIBC, TRANSFERRIN, FERRITIN in the last 72 hours. @lablastinr3 @ Studies/Results: CT HEAD WO CONTRAST  Result Date: 08/17/2020 CLINICAL DATA:  Head trauma, mod-severe; worsening headache after left SDH. EXAM: CT HEAD WITHOUT CONTRAST TECHNIQUE: Contiguous axial images were obtained from the base of the skull through the vertex without intravenous contrast. COMPARISON:  Head CT August 06, 2020. FINDINGS: Brain: Postsurgical changes from left craniotomy with interval resolution pneumocephalus, now with a 5 mm epidural fluid collection subjacent to the bone flap, likely a small seroma. There is minimal mass effect on the adjacent brain parenchyma without midline shift. Residual subdural fluid collection in the left cerebral convexity measuring to 5 mm, predominantly hypodense without mass. No hyperdense focus to suggest new acute/subacute bleeding. No evidence of an acute infarct hydrocephalus or mass lesion. Vascular: No hyperdense vessel. Skull: Postsurgical changes from craniotomy. Sinuses/Orbits: No acute finding. Other: None. IMPRESSION: 1. Small residual left-sided subdural fluid collection measuring to 5 mm without significant mass effect on the brain parenchyma. No evidence of new hemorrhage. 2. Small epidural fluid collection subjacent to the left frontal bone flap most consistent small seroma. Electronically  Signed   By: Pedro Earls M.D.   On: 08/17/2020 09:37   Medications:   atorvastatin  10 mg Oral QPM   [START ON 08/18/2020] darbepoetin (ARANESP) injection - DIALYSIS  100 mcg Intravenous Q Wed-HD   diltiazem  120 mg Oral Q T,Th,S,Su-1800   And   diltiazem  30 mg Oral Q M,W,F-1800   levETIRAcetam  250 mg Oral BID   midodrine  20 mg Oral Q M,W,F-HD   multivitamin  1 tablet Oral QHS   pantoprazole  40 mg Oral BID   polyethylene  glycol  17 g Oral Daily   senna  1 tablet Oral BID   sevelamer carbonate  800 mg Oral TID WC   topiramate  50 mg Oral BID     Dialysis Orders: [facility]  on MWF  EDW 86kg Bath 2K/2Ca 4 hrs Heparin 4000 units bolus. RUE AVF BFR 350 DFR 500    Hectoral 5 mcg IV/HD,  Cinacalcet 30 mg with HD TIW   Assessment/Plan: Subdural hematoma L>R - s/p left parietal craniotomy and evacuation of hematoma.  S/p drain removal and staples placed.  Did note right SDH but was small. Now in CIR. Recurrent HA. CT did not show any new bleed -   she says headache better  ESRD: Back to usual MWF schedule via AVF -> next HD 8/3  Hypertension/volume  - stable BP. On midodrine 20mg  qHD. At EDW, but thinks needs to be lowered.  Anemia: Hgb down to 8.3--- 8.9,  Increased Aranesp 100 mcg IV q Mon. Tsat 35%, no iron needed.    Metabolic bone disease -  Ca ok, Phos low twice -> reduce Renvela to 1/meals. Hectorol and sensipar have not been given here. PTH 140.  Leaving hospital on 8/5 so will not dose  Nutrition - renal diet w/fluid restrictions. A fib - seen by cardiology, on cardizem.   GERD Diastolic congestive HF - fluid managed with dialysis. Repeat ECHO with EF 55-60% and indeterminiate diastolic function Dispo - To d/c on 8/5 after HD   Corliss Parish, MD 08/17/2020

## 2020-08-17 NOTE — Progress Notes (Signed)
PROGRESS NOTE   Subjective/Complaints: Was in bed most of yesterday with worsening headache. Still has one this morning. Unable to sleep.  Rn reports some anxiety/depressive behaviors  ROS: Limited due to cognitive/behavioral   Objective:   CT HEAD WO CONTRAST  Result Date: 08/17/2020 CLINICAL DATA:  Head trauma, mod-severe; worsening headache after left SDH. EXAM: CT HEAD WITHOUT CONTRAST TECHNIQUE: Contiguous axial images were obtained from the base of the skull through the vertex without intravenous contrast. COMPARISON:  Head CT August 06, 2020. FINDINGS: Brain: Postsurgical changes from left craniotomy with interval resolution pneumocephalus, now with a 5 mm epidural fluid collection subjacent to the bone flap, likely a small seroma. There is minimal mass effect on the adjacent brain parenchyma without midline shift. Residual subdural fluid collection in the left cerebral convexity measuring to 5 mm, predominantly hypodense without mass. No hyperdense focus to suggest new acute/subacute bleeding. No evidence of an acute infarct hydrocephalus or mass lesion. Vascular: No hyperdense vessel. Skull: Postsurgical changes from craniotomy. Sinuses/Orbits: No acute finding. Other: None. IMPRESSION: 1. Small residual left-sided subdural fluid collection measuring to 5 mm without significant mass effect on the brain parenchyma. No evidence of new hemorrhage. 2. Small epidural fluid collection subjacent to the left frontal bone flap most consistent small seroma. Electronically Signed   By: Pedro Earls M.D.   On: 08/17/2020 09:37   Recent Labs    08/16/20 1354  WBC 4.2  HGB 8.9*  HCT 27.0*  PLT 237   Recent Labs    08/16/20 1355  NA 136  K 5.9*  CL 96*  CO2 27  GLUCOSE 103*  BUN 50*  CREATININE 10.53*  CALCIUM 9.3     Intake/Output Summary (Last 24 hours) at 08/17/2020 0959 Last data filed at 08/16/2020 2200 Gross per  24 hour  Intake 360 ml  Output 2928 ml  Net -2568 ml        Physical Exam: Vital Signs Blood pressure 116/66, pulse 80, temperature 98.8 F (37.1 C), temperature source Oral, resp. rate 20, height 5\' 1"  (1.549 m), weight 89.2 kg, SpO2 98 %. Constitutional: No distress . Vital signs reviewed. HEENT: EOMI, oral membranes moist Neck: supple Cardiovascular: RRR without murmur. No JVD    Respiratory/Chest: CTA Bilaterally without wheezes or rales. Normal effort    GI/Abdomen: BS +, non-tender, non-distended Ext: no clubbing, cyanosis, or edema Psych: pleasant and cooperative  Skin: left crani incision remains CDI with staples Neurologic: reasonable insight and awareness. motor strength is 5/5 in left 4 to 4+ right deltoid, bicep, tricep, grip, hip flexor, knee extensors, ankle dorsiflexor and plantar flexor. Good standing balance Normal sensory and cerebellar. Musculoskeletal: Full range of motion in all 4 extremities. No joint swelling, sternal, left chest pain reproducible with palpation   Assessment/Plan: 1. Functional deficits which require 3+ hours per day of interdisciplinary therapy in a comprehensive inpatient rehab setting. Physiatrist is providing close team supervision and 24 hour management of active medical problems listed below. Physiatrist and rehab team continue to assess barriers to discharge/monitor patient progress toward functional and medical goals  Care Tool:  Bathing    Body parts bathed by patient: Right arm,  Left arm, Chest, Abdomen, Front perineal area, Buttocks, Right upper leg, Left upper leg, Face, Right lower leg, Left lower leg   Body parts bathed by helper: Right lower leg, Left lower leg, Buttocks     Bathing assist Assist Level: Supervision/Verbal cueing     Upper Body Dressing/Undressing Upper body dressing   What is the patient wearing?: Pull over shirt    Upper body assist Assist Level: Set up assist    Lower Body  Dressing/Undressing Lower body dressing      What is the patient wearing?: Underwear/pull up, Pants     Lower body assist Assist for lower body dressing: Supervision/Verbal cueing     Toileting Toileting Toileting Activity did not occur (Clothing management and hygiene only): N/A (no void or bm)  Toileting assist Assist for toileting: Contact Guard/Touching assist     Transfers Chair/bed transfer  Transfers assist     Chair/bed transfer assist level: Supervision/Verbal cueing Chair/bed transfer assistive device: Museum/gallery exhibitions officer assist      Assist level: Contact Guard/Touching assist Assistive device: Walker-rolling Max distance: >250 ft   Walk 10 feet activity   Assist     Assist level: Contact Guard/Touching assist Assistive device: Walker-rolling   Walk 50 feet activity   Assist Walk 50 feet with 2 turns activity did not occur: Safety/medical concerns (limited by lethargy/fatigue)  Assist level: Contact Guard/Touching assist Assistive device: Walker-rolling    Walk 150 feet activity   Assist Walk 150 feet activity did not occur: Safety/medical concerns  Assist level: Contact Guard/Touching assist Assistive device: Walker-rolling    Walk 10 feet on uneven surface  activity   Assist Walk 10 feet on uneven surfaces activity did not occur: Safety/medical concerns   Assist level: Contact Guard/Touching assist Assistive device: Aeronautical engineer Will patient use wheelchair at discharge?: No   Wheelchair activity did not occur: Safety/medical concerns (limited assessment at eval due to lethargy/fatigue)         Wheelchair 50 feet with 2 turns activity    Assist    Wheelchair 50 feet with 2 turns activity did not occur: Safety/medical concerns       Wheelchair 150 feet activity     Assist  Wheelchair 150 feet activity did not occur: Safety/medical concerns        Blood pressure 116/66, pulse 80, temperature 98.8 F (37.1 C), temperature source Oral, resp. rate 20, height 5\' 1"  (1.549 m), weight 89.2 kg, SpO2 98 %.  Medical Problem List and Plan: 1.  Decreased functional mobility with gait disturbance secondary to traumatic left SDH with 4 mm left to right midline shift as well as small right subdural hematoma status post left parietal craniotomy evacuation of subdural hematoma 08/02/2020.  Tapered off from [place], discussed with neurosurgery yesterday              -patient may shower             -ELOS/Goals: 08/20/20, sup to mod I goals.   -Continue CIR therapies including PT, OT, and SLP    -headaches better today. HCT personally reviewed and reveals new 47mm epidural seroma, prior subdural fluid collection also. Overall improved. ---rx pain 2.  Impaired mobility -DVT/anticoagulation: continue SCDs             -antiplatelet therapy: N/A 3. Pain Management/post-traumatic headache: Hydrocodone as needed  -continue topamax trial 25mg  bid with benefit---increase to 50mg  bid  -tylenol prn  4. Mood: [place] motional support             -antipsychotic agents: N/A 5. Neuropsych: This patient is capable of making decisions on her own behalf. 6. Skin/Wound Care: dc staples 7/29 7. Fluids/Electrolytes/Nutrition: pt doesn't like renal restrictions  -liberalized diet with FR 8.  Seizure prophylaxis.  Keppra 250 mg twice daily 9.  Atrial fibrillation/chest pain.  appreciate cards help.   -cardizem adjusted to 120mg  on non-HD days and 30 on HD days  -8/1 HR, BP controlled currently  -continue monitor with therapy activities  10.  Hypotension.  ProAmatine 20 mg Monday Wednesday Friday 11.  GERD.  Protonix- needs HOB up 30 deg at all times and upright during meals and 1 hour after  12.  End-stage renal disease/hyperkalemia.  Hemodialysis per renal service.  Continue Lokelma as directed  -HD after therapy day to allow maximal participation 13.  Diastolic  congestive heart failure.  Monitor for any signs of fluid overload  -weights balanced    weight down, continue to monitor Filed Weights   08/16/20 0517 08/16/20 1300 08/17/20 0508  Weight: 84.6 kg 90 kg 89.2 kg     14. Anemia:   -likely acute on chronic.    -stool is OB+  -7/29 8.3-->8/1 8.9  LOS: 10 days A FACE TO FACE EVALUATION WAS PERFORMED  Meredith Staggers 08/17/2020, 9:59 AM

## 2020-08-17 NOTE — Progress Notes (Signed)
Patient ID: Karen [name], female   DOB: [birth year], 65 y.o.   MRN: [id number]  SW met with pt in room to provide updates from team conference, and d/c date being 8/4. Pt reported she was informed her d/c date was 8/5. SW explained change. Pt prefers to stay here through 8/5 to avoid challenges with getting to dialysis on Friday. Pt states her son was not able to come in for family edu due to having to care for his father, and [name] currently has a knee injury. Pt aware SW to follow-up with her son to discuss family education.  SW updated medical team on change in pt d/c date to 8/5.  SW spoke with pt son Eustace Moore who confirmed that he will come in for family edu tomorrow 9am-12pm. SW informed on above. SW indicated will f/u tomorrow with his mother to discuss HHA preference. He is concerned about how much support he can be since he has to work, and his son's mother is working as well. Reports she will come by on days when she gets off early which is usually around 12:30pm. SW shared there were no resources available to help aide. SW will explore options and follow-up if any resources available. SW will continue to provide updates.        Loralee Pacas, MSW, [place] Office: [phone] Cell: [phone] Fax: [phone]

## 2020-08-17 NOTE — Plan of Care (Signed)
  Problem: RH Problem Solving Goal: LTG Patient will demonstrate problem solving for (SLP) Description: LTG:  Patient will demonstrate problem solving for basic/complex daily situations with cues  (SLP) Flowsheets (Taken 08/17/2020 0620) LTG Patient will demonstrate problem solving for: Supervision Note: Goal downgraded due to slow progress   Problem: RH Memory Goal: LTG Patient will use memory compensatory aids to (SLP) Description: LTG:  Patient will use memory compensatory aids to recall biographical/new, daily complex information with cues (SLP) Flowsheets (Taken 08/17/2020 0620) LTG: Patient will use memory compensatory aids to (SLP): Supervision Note: Goal downgraded due to slow progress   Problem: RH Attention Goal: LTG Patient will demonstrate this level of attention during functional activites (SLP) Description: LTG:  Patient will will demonstrate this level of attention during functional activites (SLP) Flowsheets (Taken 08/17/2020 0620) Patient will demonstrate during cognitive/linguistic activities the attention type of: Sustained LTG: Patient will demonstrate this level of attention during cognitive/linguistic activities with assistance of (SLP): Supervision Number of minutes patient will demonstrate attention during cognitive/linguistic activities: 15 Note: Goal downgraded due to slow progress

## 2020-08-17 NOTE — Progress Notes (Signed)
Slept good. Declined need for pain med thus far this shift. Spoke with CT, will scan patient by 0700. Did not eat breakfast or lunch. Requests crackers at HS. Complained of right great toe pain, no redness or swelling noted. Denied h/o of gout. Bilateral heels elevated off bed. Left Crani incision with staples. Declined PRN pain meds. Karen [name] A

## 2020-08-17 NOTE — Patient Care Conference (Signed)
Inpatient RehabilitationTeam Conference and Plan of Care Update Date: 08/17/2020   Time: 10:40 AM    Patient Name: Karen [name]      Medical Record Number: [id number]  Date of Birth: [birth year] Sex: Female         Room/Bed: 4W26C/4W26C-01 Payor Info: Payor: [employer] park [employer] MEDICARE / Plan: [facility] MEDICARE / Product Type: *No Product type* /    Admit Date/Time:  08/07/2020  6:24 PM  Primary Diagnosis:  Traumatic subdural hematoma [facility])  Hospital Problems: Principal Problem:   Traumatic subdural hematoma ([place]) Active Problems:   ESRD on hemodialysis ([place])   Acute on chronic anemia   Acute post-traumatic headache, not intractable    Expected Discharge Date: Expected Discharge Date: 08/20/20  Team Members Present: Physician leading conference: Dr. Alger Simons Social Worker Present: Loralee Pacas, [place] Nurse Present: Dorthula Nettles, RN PT Present: Tereasa Coop, PT OT Present: Cherylynn Ridges, OT SLP Present: Weston Anna, SLP PPS Coordinator present : Gunnar Fusi, SLP     Current Status/Progress Goal Weekly Team Focus  Bowel/Bladder   Continent B&B, taking scheduled miralax daily & senna bid. HD-M-W-F  remain continent  assess every shift, adjust meds as needed   Swallow/Nutrition/ Hydration             ADL's   Supervision/CGA  Mod I/supervision  dc planning, pt/family education, functional ambulation, activity tolerance   Mobility   supevision bed mobility, ambulation >300' with supervision, 16 steps with L hand rail  mod(I)  DC prep   Communication             Safety/Cognition/ Behavioral Observations  Min A  Supervision  complex problem solving, recall and attention   Pain   increased c/o HA, tompamax increased, taking PRN tylenol & norco. CT of head ordered to f/u  < 3 on pain scale  assess pain every shift and PRN   Skin   skin intact, monitor right AVG site after HD  remain intact and free of infection  assess every shift and PRN      Discharge Planning:  Pt needs to be independent at d/c as she is the primary caregiver for her husband who is bed ridden; their son can only provide intermittent support. Current support for grandon's mother who is helping care for  her husband. Limited natural supports and community resources available. Pt son will need to transport pt to dialysis.   Team Discussion: Has anxiety, depression. CT showed a small collection of fluid but stable. Increased Topamax. Anuric, continent of bowel. Reports headache but takes prn medications. Refused to have staples removed yesterday, removed this morning. Not following diet. Son was a no-show for family education. Supervision to contact guard for ADL's. Supervision overall. Her biggest obstacle is getting husband from bed to chair. SLP reports patient is slow to process and is going to need assistance. Patient on target to meet rehab goals: yes  *See Care Plan and progress notes for long and short-term goals.   Revisions to Treatment Plan:  Increased Topamax for headaches.  Teaching Needs: Family education, medication management, pain management, skin/wound care, transfer training, gait training, balance training, endurance training, stair training, safety awareness.  Current Barriers to Discharge: Decreased caregiver support, Medical stability, Home enviroment access/layout, Wound care, Lack of/limited family support, Weight, Hemodialysis, Medication compliance, and Behavior  Possible Resolutions to Barriers: Continue current medications, provide emotional support.     Medical Summary Current Status: severe headache over weekend. better today with increase in topamax. CT  with only small hygromas, stable. ESRD on hemodialysis, anemic  Barriers to Discharge: Medical stability   Possible Resolutions to [employer] Focus: daily assessment of labs, rx of pain, volume mgt, review of imaging   Continued Need for Acute Rehabilitation Level of  Care: The patient requires daily medical management by a physician with specialized training in physical medicine and rehabilitation for the following reasons: Direction of a multidisciplinary physical rehabilitation program to maximize functional independence : Yes Medical management of patient stability for increased activity during participation in an intensive rehabilitation regime.: Yes Analysis of laboratory values and/or radiology reports with any subsequent need for medication adjustment and/or medical intervention. : Yes   I attest that I was present, lead the team conference, and concur with the assessment and plan of the team.   Cristi Loron 08/17/2020, 4:41 PM

## 2020-08-17 NOTE — Progress Notes (Signed)
Occupational Therapy TBI Note  Patient Details  Name: Karen [name] MRN: [id number] Date of Birth: 06-Nov-1955  Today's Date: 08/17/2020 OT Individual Time: 1300-1400 OT Individual Time Calculation (min): 60 min   Short Term Goals: Week 2:  OT Short Term Goal 1 (Week 2): LTG=STG 2/2 ELOS  Skilled Therapeutic Interventions/Progress Updates:    Pt greeted semi-reclined in bed and agreeable to OT treatment session. Pt requesting to shower today. Pt completed bed mobility with supervision, then ambulated into bathroom with RW and close supervision. Min A to doff lower body clothing including TED hose. Bathing completed with overall CGA when standing to wash buttocks. Able to reach to feet today! Dressing from EOB with education provided for friction reducing bag to don TED hose. Min A overall for LB ADLs. Discussed discharge plan with pt including her being caregiver for husband. Discussed safety, energy conservation, and falls risk. Pt then ambulated in hallway with RW and close supervision, verbal cues for proximity to RW. Pt left seated in wc at end of session with chair alarm on, call bell in reach and needs met.  Therapy Documentation Precautions:  Precautions Precautions: Fall Precaution Comments: R UE fistula Restrictions Weight Bearing Restrictions: No Pain:  Denies pain Agitated Behavior Scale: TBI Observation Details Observation Environment: CIR Start of observation period - Date: 08/17/20 Start of observation period - Time: 1300 End of observation period - Date: 08/17/20 End of observation period - Time: 1400 Agitated Behavior Scale (DO NOT LEAVE BLANKS) Short attention span, easy distractibility, inability to concentrate: Absent Impulsive, impatient, low tolerance for pain or frustration: Absent Uncooperative, resistant to care, demanding: Absent Violent and/or threatening violence toward people or property: Absent Explosive and/or unpredictable anger:  Absent Rocking, rubbing, moaning, or other self-stimulating behavior: Absent Pulling at tubes, restraints, etc.: Absent Wandering from treatment areas: Absent Restlessness, pacing, excessive movement: Absent Repetitive behaviors, motor, and/or verbal: Absent Rapid, loud, or excessive talking: Absent Sudden changes of mood: Absent Easily initiated or excessive crying and/or laughter: Absent Self-abusiveness, physical and/or verbal: Absent Agitated behavior scale total score: 14   Therapy/Group: Individual Therapy  Valma Cava 08/17/2020, 3:08 PM

## 2020-08-17 NOTE — Progress Notes (Signed)
Staples removed per MD order. A total of 20 staples were remove. Patient tolerated it well. We continue to monitor

## 2020-08-17 NOTE — Progress Notes (Signed)
Speech Language Pathology Weekly Progress and Session Note  Patient Details  Name: Karen [name] MRN: [id number] Date of Birth: 01/06/1956  Beginning of progress report period: August 08, 2020 End of progress report period: August 17, 2020  Today's Date: 08/17/2020 SLP Individual Time: 1400-1430 SLP Individual Time Calculation (min): 30 min  Short Term Goals: Week 1: SLP Short Term Goal 1 (Week 1): STG=LTG due to ELOS SLP Short Term Goal 1 - Progress (Week 1): Progressing toward goal    New Short Term Goals: Week 2: SLP Short Term Goal 1 (Week 2): STG=LTG due to ELOS  Weekly Progress Updates: Patient has made functional gains and is progressing towards all LTGs this reporting period. Currently, patient demonstrates behaviors consistent with a Rancho Level VII and requires overall supervision-Min A verbal cues for functional problem solving, recall, awareness and attention. Patient and family education ongoing. Patient would benefit from continued skilled SLP intervention to maximize her cognitive functioning prior to discharge.     ntensity: Minumum of 1-2 x/day, 30 to 90 minutes Frequency: 1 to 3 out of 7 days Duration/Length of Stay: 08/20/20 Treatment/Interventions: Cognitive remediation/compensation;Cueing hierarchy;Internal/external aids;Functional tasks;Patient/family education   Daily Session  Skilled Therapeutic Interventions:  Skilled treatment session focused on cognitive goals. Upon arrival, patient was frustrated secondary to a conversation regarding discharge planning. Patient independently called her son and SLP arranged family education to maximize safety at home. SLP also facilitated session by providing supervision level verbal cues for problem solving regarding transportation to dialysis once at home as well as a routine/schedule to help maximize sleep and overall health. Patient left upright in wheelchair with alarm on and all needs within reach.      Pain No/Denies Pain   Therapy/Group: Individual Therapy  [place] Rosenberry 08/17/2020, 3:15 PM

## 2020-08-18 LAB — CBC
HCT: 31.1 % — ABNORMAL LOW (ref 36.0–46.0)
Hemoglobin: 10 g/dL — ABNORMAL LOW (ref 12.0–15.0)
MCH: 33 pg (ref 26.0–34.0)
MCHC: 32.2 g/dL (ref 30.0–36.0)
MCV: 102.6 fL — ABNORMAL HIGH (ref 80.0–100.0)
Platelets: 237 10*3/uL (ref 150–400)
RBC: 3.03 MIL/uL — ABNORMAL LOW (ref 3.87–5.11)
RDW: 14.6 % (ref 11.5–15.5)
WBC: 4.5 10*3/uL (ref 4.0–10.5)
nRBC: 0 % (ref 0.0–0.2)

## 2020-08-18 LAB — RENAL FUNCTION PANEL
Albumin: 3.4 g/dL — ABNORMAL LOW (ref 3.5–5.0)
Anion gap: 13 (ref 5–15)
BUN: 41 mg/dL — ABNORMAL HIGH (ref 8–23)
CO2: 28 mmol/L (ref 22–32)
Calcium: 9.4 mg/dL (ref 8.9–10.3)
Chloride: 98 mmol/L (ref 98–111)
Creatinine, Ser: 9.17 mg/dL — ABNORMAL HIGH (ref 0.44–1.00)
GFR, Estimated: 4 mL/min — ABNORMAL LOW (ref >60)
Glucose, Bld: 84 mg/dL (ref 70–99)
Phosphorus: 3 mg/dL (ref 2.5–4.6)
Potassium: 5.5 mmol/L — ABNORMAL HIGH (ref 3.5–5.1)
Sodium: 139 mmol/L (ref 135–145)

## 2020-08-18 MED ORDER — HYDROCODONE-ACETAMINOPHEN 5-325 MG PO TABS
1.0000 | ORAL_TABLET | Freq: Every day | ORAL | Status: DC | PRN
  Administered 2020-08-19: 1 via ORAL

## 2020-08-18 NOTE — Discharge Planning (Signed)
Notified [facility] that patient will be discharged on Friday and return to outpatient on Monday 6:30.Patient has current hep b

## 2020-08-18 NOTE — Progress Notes (Signed)
Physical Therapy Session Note  Patient Details  Name: Analucia Hush MRN: [id number] Date of Birth: 08-25-1955  Today's Date: 08/18/2020 PT Individual Time: 6301-6010 PT Individual Time Calculation (min): 53 min   Short Term Goals: Week 2:  PT Short Term Goal 1 (Week 2): STG=LTG due to ELOS.  Skilled Therapeutic Interventions/Progress Updates:     Pt received supine in bed and agrees to therapy, though states that she is very fatigued and "ready to get out of this place." No complaint of pain. Supine to sit with verbal cues for positioning and sequencing. PT assists with donning bilateral ted hose and shoes for time management. Pt performs sit to stand with close supervision and cues for hand placement. Pt ambulates x200' to gym with RW and CGA, with cues for upright gaze to improve posture and balance, increasing proximity to RW for safety, and decreasing WB through RW for energy conservation. Pt takes seated rest break and PT retrieves rollator to trial. PT provides overview of rollator and safe management. Pt performs sit to stand with cues on locking brakes, then ambulates x300' with rollator prior to engaging brakes and taking seated rest break on rollator. Pt then ambulates x150' with rollator and again uses brakes to turn and sit for rest. Pt calls son who had been scheduled for family ed this session. Son had still not left home due to caretaking responsibilities for pt's husband. PT discusses possibility of family ed over phone with pt's son on Thursday and pt and son agreeable. Pt ambulates back to room with CGA. Left seated in WC with alarm intact and all needs within reach.  Therapy Documentation Precautions:  Precautions Precautions: Fall Precaution Comments: R UE fistula Restrictions Weight Bearing Restrictions: No   Therapy/Group: Individual Therapy  Breck Coons, PT, DPT 08/18/2020, 4:14 PM

## 2020-08-18 NOTE — Progress Notes (Signed)
Notified by [facility] -Dialysis  transfer report provided, patient made aware    2225 Patient transferred via bed to dialysis, no acute distress.  08/19/20@ 0304 Received call from Dialysis nurse for transfer, VSS 122/63;73. 16; 98.4, medicated for c/o headache with Vicodin po at 0210,   Patient returned to department ,from dialysis no c/o very sleepy,monitor places in bed, VS monitor patient retsing

## 2020-08-18 NOTE — Progress Notes (Addendum)
[name] Progress Note   Subjective: Seen in shower!!! "You've seen everything else, just come on it!". Very talkative, no C/O HA today. Happy to be going home. HD later today on schedule.   Will need to have HD 1st shift Friday then will DC home.    Objective Vitals:   08/17/20 1443 08/17/20 1716 08/17/20 2005 08/18/20 0328  BP: (!) 110/57 121/66 121/62 128/73  Pulse: 85 85 92 82  Resp: 18  18 18   Temp: 98.6 F (37 C)  98.4 F (36.9 C) 98.2 F (36.8 C)  TempSrc: Oral  Oral Oral  SpO2: 100%  100% 100%  Weight:    88.3 kg  Height:       Physical Exam General: Very pleasant older female in NAD HEENT: PERRLA. Sutures to L scalp Heart: S1,S2, No M/R/G Lungs: CTAB Abdomen: NABS Extremities: No LE edema. Compression hose in use. Dialysis Access: R AVF+T/B     Additional Objective Labs: Basic Metabolic Panel: Recent Labs  Lab 08/12/20 1200 08/13/20 1414 08/16/20 1355  NA 137 136 136  K 3.6 4.6 5.9*  CL 97* 97* 96*  CO2 28 29 27   GLUCOSE 102* 151* 103*  BUN 40* 36* 50*  CREATININE 8.89* 8.69* 10.53*  CALCIUM 9.0 8.7* 9.3  PHOS 2.4* 2.2* 3.8   Liver Function Tests: Recent Labs  Lab 08/12/20 1200 08/13/20 1414 08/16/20 1355  ALBUMIN 2.8* 2.6* 3.0*   No results for input(s): LIPASE, AMYLASE in the last 168 hours. CBC: Recent Labs  Lab 08/12/20 1200 08/13/20 1414 08/16/20 1354  WBC 4.5 4.9 4.2  HGB 8.8* 8.3* 8.9*  HCT 27.5* 25.5* 27.0*  MCV 100.4* 101.6* 101.5*  PLT 227 199 237   Blood Culture    Component Value Date/Time   SDES BLOOD LEFT HAND 01/07/2018 0110   SPECREQUEST  01/07/2018 0110    BOTTLES DRAWN AEROBIC ONLY Blood Culture results may not be optimal due to an excessive volume of blood received in culture bottles   CULT  01/07/2018 0110    NO GROWTH 5 DAYS Performed at [facility] [address], [place], [place] [address]    REPTSTATUS 01/12/2018 FINAL 01/07/2018 0110    Cardiac Enzymes: No results for  input(s): CKTOTAL, CKMB, CKMBINDEX, TROPONINI in the last 168 hours. CBG: No results for input(s): GLUCAP in the last 168 hours. Iron Studies: No results for input(s): IRON, TIBC, TRANSFERRIN, FERRITIN in the last 72 hours. @lablastinr3 @ Studies/Results: CT HEAD WO CONTRAST  Result Date: 08/17/2020 CLINICAL DATA:  Head trauma, mod-severe; worsening headache after left SDH. EXAM: CT HEAD WITHOUT CONTRAST TECHNIQUE: Contiguous axial images were obtained from the base of the skull through the vertex without intravenous contrast. COMPARISON:  Head CT August 06, 2020. FINDINGS: Brain: Postsurgical changes from left craniotomy with interval resolution pneumocephalus, now with a 5 mm epidural fluid collection subjacent to the bone flap, likely a small seroma. There is minimal mass effect on the adjacent brain parenchyma without midline shift. Residual subdural fluid collection in the left cerebral convexity measuring to 5 mm, predominantly hypodense without mass. No hyperdense focus to suggest new acute/subacute bleeding. No evidence of an acute infarct hydrocephalus or mass lesion. Vascular: No hyperdense vessel. Skull: Postsurgical changes from craniotomy. Sinuses/Orbits: No acute finding. Other: None. IMPRESSION: 1. Small residual left-sided subdural fluid collection measuring to 5 mm without significant mass effect on the brain parenchyma. No evidence of new hemorrhage. 2. Small epidural fluid collection subjacent to the left frontal bone  flap most consistent small seroma. Electronically Signed   By: Pedro Earls M.D.   On: 08/17/2020 09:37   Medications:   atorvastatin  10 mg Oral QPM   Chlorhexidine Gluconate Cloth  6 each Topical Q0600   darbepoetin (ARANESP) injection - DIALYSIS  100 mcg Intravenous Q Wed-HD   diltiazem  120 mg Oral Q T,Th,S,Su-1800   And   diltiazem  30 mg Oral Q M,W,F-1800   levETIRAcetam  250 mg Oral BID   midodrine  20 mg Oral Q M,W,F-HD   multivitamin  1 tablet  Oral QHS   pantoprazole  40 mg Oral BID   polyethylene glycol  17 g Oral Daily   senna  1 tablet Oral BID   sevelamer carbonate  800 mg Oral TID WC   topiramate  50 mg Oral BID     Dialysis Orders: [facility]  on MWF  EDW 86kg Bath 2K/2Ca 4 hrs Heparin 4000 units bolus. RUE AVF BFR 350 DFR 500    Hectoral 5 mcg IV/HD,  Cinacalcet 30 mg with HD TIW   Assessment/Plan: Subdural hematoma L>R - s/p left parietal craniotomy and evacuation of hematoma.  S/p drain removal and staples placed.  Did note right SDH but was small. Now in CIR. Recurrent HA. CT did not show any new bleed -   she says headache better  ESRD: Back to usual MWF schedule via AVF -> next HD 8/3  Hypertension/volume  - stable BP. On midodrine 20mg  qHD. At EDW, but thinks needs to be lowered.  Anemia: Hgb down to 8.3--- 8.9,  Increased Aranesp 100 mcg IV q Mon. Tsat 35%, no iron needed.    Metabolic bone disease -  Ca ok, Phos low twice -> reduce Renvela to 1/meals. Hectorol and sensipar have not been given here. PTH 140.  Leaving hospital on 8/5 so will not dose Nutrition - renal diet w/fluid restrictions. A fib - seen by cardiology, on cardizem.   GERD Diastolic congestive HF - fluid managed with dialysis. Repeat ECHO with EF 55-60% and indeterminiate diastolic function Dispo - To d/c on 8/5 after HD   Rita H. Brown NP-C 08/18/2020, 11:36 AM  [facility] [phone]  Patient seen and examined, agree with above note with above modifications. Nop new issues-  slightly upset b/c had to be bumped to this evening to do an HD emergency-  but in good spirits-  tentatively going home Friday after HD.  Cont with routine HD and appropriate titration of dialysis related meds  Corliss Parish, MD 08/18/2020

## 2020-08-18 NOTE — Progress Notes (Signed)
Occupational Therapy Session Note  Patient Details  Name: Karen [name] MRN: [id number] Date of Birth: 09/14/1955  Today's Date: 08/18/2020 OT Individual Time: 1100-1200 OT Individual Time Calculation (min): 60 min    Short Term Goals: Week 2:  OT Short Term Goal 1 (Week 2): LTG=STG 2/2 ELOS  Skilled Therapeutic Interventions/Progress Updates:    Pt received sitting in the w/c with no c/o pain, eager to take shower. Son not present for family education. Pt completed sit > stand with supervision and ambulatory transfer with the rollator with very close supervision. She required min A to remove teds but was otherwise able to doff all clothing. Set up assist and distant supervision for bathing seated on TTB. Transfer back to w/c with supervision using rollator, min cueing for management of rollator. Pt donned shirt and pants with supervision sit <> stand. Pt able to don own teds with supervision seated. Pt left supine with all needs met bed alarm set.   Therapy Documentation Precautions:  Precautions Precautions: Fall Precaution Comments: R UE fistula Restrictions Weight Bearing Restrictions: No  Therapy/Group: Individual Therapy  Sandra H Davis 08/18/2020, 6:14 AM 

## 2020-08-18 NOTE — Progress Notes (Signed)
Speech Language Pathology TBI Note  Patient Details  Name: Karen [name] MRN: [id number] Date of Birth: [birth year]  Today's Date: 08/18/2020 SLP Individual Time: 0935-1030 SLP Individual Time Calculation (min): 55 min  Short Term Goals: Week 2: SLP Short Term Goal 1 (Week 2): STG=LTG due to ELOS  Skilled Therapeutic Interventions: Skilled treatment session focused on cognitive goals. SLP facilitated session by providing supervision level verbal cues for recall of her current medications and their functions. Patient agreeable to utilize a pill organizer at home. SLP provided several examples of pill organizers and discussed pro/cons. SLP also facilitated session by providing examples of a routine to utilize at home to maximize function and safety. Patient verbalized understanding of all information. Patient left upright in wheelchair with alarm on and all needs within  reach. Continue with current plan of care.      Pain No/Denies Pain   Agitated Behavior Scale: TBI Observation Details Observation Environment: Patient's room Start of observation period - Date: 08/18/20 Start of observation period - Time: 0935 End of observation period - Date: 08/18/20 End of observation period - Time: 1030 Agitated Behavior Scale (DO NOT LEAVE BLANKS) Short attention span, easy distractibility, inability to concentrate: Absent Impulsive, impatient, low tolerance for pain or frustration: Absent Uncooperative, resistant to care, demanding: Absent Violent and/or threatening violence toward people or property: Absent Explosive and/or unpredictable anger: Absent Rocking, rubbing, moaning, or other self-stimulating behavior: Absent Pulling at tubes, restraints, etc.: Absent Wandering from treatment areas: Absent Restlessness, pacing, excessive movement: Absent Repetitive behaviors, motor, and/or verbal: Absent Rapid, loud, or excessive talking: Absent Sudden changes of mood: Absent Easily  initiated or excessive crying and/or laughter: Absent Self-abusiveness, physical and/or verbal: Absent Agitated behavior scale total score: 14  Therapy/Group: Individual Therapy  Masin Shatto 08/18/2020, 3:17 PM

## 2020-08-18 NOTE — Progress Notes (Signed)
Patient ID: Karen [name], female   DOB: [birth year], 66 y.o.   MRN: [id number]  SW returned phone call to [name]/SW at [facility] [phone]) who was inquiring about patient discharge plan. 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 Suggested pt apply for grant services for transportation through [facility] transportation service. SW waiting on email for form so SW can meet with pt for her to sign.   SW met with pt in room to discuss above, and [facility] preference. Prefers [facility].   SW sent HHPT/OT/SLP/aide referral to [facility].  SW sent signed transportation form back to [facility].   Referral declined by [facility].   SW sent [facility] referral to [facility] and waiting on f/u. *No OT in area.   SW sent HHPT/OT/SLP/aide referral to [facility] and waiting on follow-up.   Loralee Pacas, MSW, [place] Office: [phone] Cell: [phone] Fax: [phone]

## 2020-08-19 ENCOUNTER — Encounter (HOSPITAL_COMMUNITY): Payer: Medicare Other

## 2020-08-19 MED ORDER — HYDROCODONE-ACETAMINOPHEN 5-325 MG PO TABS
ORAL_TABLET | ORAL | Status: AC
  Filled 2020-08-19: qty 1

## 2020-08-19 MED ORDER — DARBEPOETIN ALFA 100 MCG/0.5ML IJ SOSY
PREFILLED_SYRINGE | INTRAMUSCULAR | Status: AC
  Filled 2020-08-19: qty 0.5

## 2020-08-19 NOTE — Progress Notes (Signed)
Occupational Therapy Session Note  Patient Details  Name: Karen [name] MRN: [id number] Date of Birth: [birth year]  Today's Date: 08/19/2020 OT Group Time: 1100-1202 OT Group Time Calculation (min): 62 min   Short Term Goals: Week 2:  OT Short Term Goal 1 (Week 2): LTG=STG 2/2 ELOS  Skilled Therapeutic Interventions/Progress Updates:  Pt participated in group session with a focus on therapeutic activity of Bowling to facilitate magagement of w/c, hand-eye- coordination, UB strength, core strength, social interaction, and increasing activity tolerance. Pt completed bowling turns from sitting in w/c with RUE, pt able to complete w/c mgmt with supervision, pt required intermittent cues to remember to lock brakes during bowling turns.  Pt did request lighter ball as pt reports bowling ball too heavy, downgraded task by issuing pt lighter ball to play with. Pt with no c/o pain during session, pt actively interacting with other group members by  providing encouragement to other group members. Pt transported back to room by RT.   Therapy Documentation Precautions:  Precautions Precautions: Fall Precaution Comments: R UE fistula Restrictions Weight Bearing Restrictions: No    Pain: Pt reports no pain during session.    Therapy/Group: Group Therapy  Precious Haws 08/19/2020, 12:23 PM

## 2020-08-19 NOTE — Plan of Care (Signed)
  Problem: RH Balance Goal: LTG Patient will maintain dynamic standing with ADLs (OT) Description: LTG:  Patient will maintain dynamic standing balance with assist during activities of daily living (OT)  Outcome: Completed/Met   Problem: Sit to Stand Goal: LTG:  Patient will perform sit to stand in prep for activites of daily living with assistance level (OT) Description: LTG:  Patient will perform sit to stand in prep for activites of daily living with assistance level (OT) Outcome: Completed/Met   Problem: RH Grooming Goal: LTG Patient will perform grooming w/assist,cues/equip (OT) Description: LTG: Patient will perform grooming with assist, with/without cues using equipment (OT) Outcome: Completed/Met   Problem: RH Bathing Goal: LTG Patient will bathe all body parts with assist levels (OT) Description: LTG: Patient will bathe all body parts with assist levels (OT) Outcome: Completed/Met   Problem: RH Dressing Goal: LTG Patient will perform upper body dressing (OT) Description: LTG Patient will perform upper body dressing with assist, with/without cues (OT). Outcome: Completed/Met Goal: LTG Patient will perform lower body dressing w/assist (OT) Description: LTG: Patient will perform lower body dressing with assist, with/without cues in positioning using equipment (OT) Outcome: Completed/Met   Problem: RH Toileting Goal: LTG Patient will perform toileting task (3/3 steps) with assistance level (OT) Description: LTG: Patient will perform toileting task (3/3 steps) with assistance level (OT)  Outcome: Completed/Met   Problem: RH Simple Meal Prep Goal: LTG Patient will perform simple meal prep w/assist (OT) Description: LTG: Patient will perform simple meal prep with assistance, with/without cues (OT). Outcome: Completed/Met   Problem: RH Toilet Transfers Goal: LTG Patient will perform toilet transfers w/assist (OT) Description: LTG: Patient will perform toilet transfers with  assist, with/without cues using equipment (OT) Outcome: Completed/Met   Problem: RH Tub/Shower Transfers Goal: LTG Patient will perform tub/shower transfers w/assist (OT) Description: LTG: Patient will perform tub/shower transfers with assist, with/without cues using equipment (OT) Outcome: Completed/Met

## 2020-08-19 NOTE — Discharge Summary (Signed)
Physician Discharge Summary  Patient ID: Karen [name] MRN: [id number] DOB/AGE: [birth year] 65 y.o.  Admit date: 08/07/2020 Discharge date: 08/20/2020  Discharge Diagnoses:  Principal Problem:   Traumatic subdural hematoma ([place]) Active Problems:   ESRD on hemodialysis ([place])   Acute on chronic anemia   Acute post-traumatic headache, not intractable Pain management Seizure prophylaxis Atrial fibrillation Hypertension GERD End-stage renal disease Diastolic congestive heart failure Chronic anemia History of breast cancer  Discharged Condition: Stable  Significant Diagnostic Studies: CT HEAD WO CONTRAST  Result Date: 08/17/2020 CLINICAL DATA:  Head trauma, mod-severe; worsening headache after left SDH. EXAM: CT HEAD WITHOUT CONTRAST TECHNIQUE: Contiguous axial images were obtained from the base of the skull through the vertex without intravenous contrast. COMPARISON:  Head CT August 06, 2020. FINDINGS: Brain: Postsurgical changes from left craniotomy with interval resolution pneumocephalus, now with a 5 mm epidural fluid collection subjacent to the bone flap, likely a small seroma. There is minimal mass effect on the adjacent brain parenchyma without midline shift. Residual subdural fluid collection in the left cerebral convexity measuring to 5 mm, predominantly hypodense without mass. No hyperdense focus to suggest new acute/subacute bleeding. No evidence of an acute infarct hydrocephalus or mass lesion. Vascular: No hyperdense vessel. Skull: Postsurgical changes from craniotomy. Sinuses/Orbits: No acute finding. Other: None. IMPRESSION: 1. Small residual left-sided subdural fluid collection measuring to 5 mm without significant mass effect on the brain parenchyma. No evidence of new hemorrhage. 2. Small epidural fluid collection subjacent to the left frontal bone flap most consistent small seroma. Electronically Signed   By: Pedro Earls M.D.   On: 08/17/2020 09:37   CT  HEAD WO CONTRAST  Result Date: 08/06/2020 CLINICAL DATA:  Follow-up subdural hematoma. EXAM: CT HEAD WITHOUT CONTRAST TECHNIQUE: Contiguous axial images were obtained from the base of the skull through the vertex without intravenous contrast. COMPARISON:  08/03/2020 FINDINGS: Brain: Sequelae of left-sided craniotomy for subdural hematoma evacuation are again identified. The subdural drain has been removed. Pneumocephalus has decreased. A small amount of residual subdural hematoma over the left cerebral convexity and along the left tentorium and posterior falx has slightly decreased. A low-density subdural collection over the right cerebral convexity is unchanged, measuring up to 6 mm in thickness. There is no significant midline shift. No new intracranial hemorrhage, acute infarct, or mass is identified. The ventricles are unchanged in size. Vascular: Calcified atherosclerosis at the skull base. Skull: Left frontal craniotomy with overlying mild scalp soft tissue swelling and small volume gas. Sinuses/Orbits: Visualized paranasal sinuses and mastoid air cells are clear. Visualized orbits are unremarkable. Other: None. IMPRESSION: 1. Sequelae of left subdural hematoma evacuation with interval drain removal, decreased pneumocephalus, and slightly decreased small volume residual subdural hematoma. No residual midline shift. 2. Unchanged small right-sided subdural hematoma. Electronically Signed   By: Logan Bores M.D.   On: 08/06/2020 18:25   CT HEAD WO CONTRAST  Result Date: 08/03/2020 CLINICAL DATA:  Subdural hemorrhage follow-up. EXAM: CT HEAD WITHOUT CONTRAST TECHNIQUE: Contiguous axial images were obtained from the base of the skull through the vertex without intravenous contrast. COMPARISON:  08/02/2020. FINDINGS: Brain: Status post left frontal craniotomy for subdural hematoma evacuation and placement of a drain. The drain tip is within the posterior aspect of the left convexity subdural hematoma.  Suspected Gel-Foam subjacent to the craniotomy defect. The left convexity hematoma is overall decreased in volume with similar maximal thickness of 11 mm. Some areas of new internal hyperdensity may represent interval hemorrhage. Similar  hemorrhage layering along the left tentorial leaflet. Increased pneumocephalus subjacent to the craniotomy and along the anti dependent left frontal convexity. Midline shift is improved, now 2 mm at the foramen of Monro. No hydrocephalus. No evidence of acute large vascular territory infarct. Similar smaller (up to 6 mm thickness) lower density right subdural collection. No mass lesion. Partially empty sella. Vascular: Calcific atherosclerosis. No hyperdense vessel identified. Skull: Left frontal craniotomy. Sinuses/Orbits: Clear sinuses.  Unremarkable orbits. Other: No mastoid effusions. IMPRESSION: 1. Status post left frontal craniotomy for subdural hematoma evacuation and placement of a drain. The left convexity hematoma is overall decreased in volume with similar maximal thickness of 11 mm. Some areas of new internal hyperdensity may represent interval hemorrhage and warrant continued attention on follow-up. 2. Improved mass effect with 2 mm of rightward midline shift at the foramen of Monro, previously 4 mm. 3. Similar hemorrhage layering along the left tentorial leaflet and similar smaller (up to 6 mm thickness) right subdural hematoma. Electronically Signed   By: Margaretha Sheffield MD   On: 08/03/2020 08:11   CT Head Wo Contrast  Result Date: 08/02/2020 CLINICAL DATA:  Headache after fall 1 month ago. EXAM: CT HEAD WITHOUT CONTRAST TECHNIQUE: Contiguous axial images were obtained from the base of the skull through the vertex without intravenous contrast. COMPARISON:  July 15, 2020. FINDINGS: Brain: Large left subdural hematoma is noted resulting in 4 mm of left-to-right midline shift. Maximum thickness is 11 mm. Small left tentorial subdural hematoma is noted as well.  Ventricular size is within normal limits. Smaller right subdural hematoma is noted. Vascular: No hyperdense vessel or unexpected calcification. Skull: Normal. Negative for fracture or focal lesion. Sinuses/Orbits: No acute finding. Other: None. IMPRESSION: Large left subdural hematoma is noted resulting in 4 mm of left-to-right midline shift. Small left tentorial subdural hematoma is also noted, as well as smaller right subdural hematoma. Critical Value/emergent results were called by telephone at the time of interpretation on 08/02/2020 at 12:09 pm to provider [facility] , who verbally acknowledged these results. Electronically Signed   By: Marijo Conception M.D.   On: 08/02/2020 12:09   MM 3D SCREEN BREAST UNI LEFT  Result Date: 07/22/2020 CLINICAL DATA:  Screening. EXAM: DIGITAL SCREENING UNILATERAL LEFT MAMMOGRAM WITH CAD AND TOMOSYNTHESIS TECHNIQUE: Left screening digital craniocaudal and mediolateral oblique mammograms were obtained. Left screening digital breast tomosynthesis was performed. The images were evaluated with computer-aided detection. COMPARISON:  Previous exam(s). ACR Breast Density Category c: The breast tissue is heterogeneously dense, which may obscure small masses. FINDINGS: There are no findings suspicious for malignancy. IMPRESSION: No mammographic evidence of malignancy. A result letter of this screening mammogram will be mailed directly to the patient. RECOMMENDATION: Screening mammogram in one year. (Code:SM-B-01Y) BI-RADS CATEGORY  1: Negative. Electronically Signed   By: Franki Cabot M.D.   On: 07/22/2020 15:00  ECHOCARDIOGRAM COMPLETE  Result Date: 08/09/2020    ECHOCARDIOGRAM REPORT   Patient Name:   LICHELLE VIETS Date of Exam: 08/09/2020 Medical Rec #:  [id number]               Height:       61.0 in Accession #:    [id number]              Weight:       199.3 lb Date of Birth:  [birth year]                BSA:  1.886 m Patient Age:    4 years                BP:            112/61 mmHg Patient Gender: F                       HR:           68 bpm. Exam Location:  Inpatient Procedure: 2D Echo, 3D Echo, Cardiac Doppler, Color Doppler and Strain Analysis Indications:    R07.9* Chest pain, unspecified  History:        Patient has prior history of Echocardiogram examinations, most                 recent 03/25/2018. Stroke, Signs/Symptoms:Dyspnea and Chest Pain;                 Risk Factors:Dyslipidemia. Subdural hematoma. Breast cancer.  Sonographer:    Roseanna Rainbow RDCS Referring Phys: 64 RHONDA G BARRETT  Sonographer Comments: Global longitudinal strain was attempted. IMPRESSIONS  1. Left ventricular ejection fraction, by estimation, is 55 to 60%. The left ventricle has normal function. The left ventricle has no regional wall motion abnormalities. There is mild left ventricular hypertrophy. Left ventricular diastolic parameters are indeterminate.  2. Right ventricular systolic function is normal. The right ventricular size is mildly enlarged. There is normal pulmonary artery systolic pressure. The estimated right ventricular systolic pressure is 99.8 mmHg.  3. The mitral valve is normal in structure. Trivial mitral valve regurgitation. No evidence of mitral stenosis.  4. The aortic valve is tricuspid. Aortic valve regurgitation is not visualized. Mild aortic valve sclerosis is present, with no evidence of aortic valve stenosis.  5. The inferior vena cava is normal in size with <50% respiratory variability, suggesting right atrial pressure of 8 mmHg. FINDINGS  Left Ventricle: Left ventricular ejection fraction, by estimation, is 55 to 60%. The left ventricle has normal function. The left ventricle has no regional wall motion abnormalities. The left ventricular internal cavity size was normal in size. There is  mild left ventricular hypertrophy. Left ventricular diastolic parameters are indeterminate. Right Ventricle: The right ventricular size is mildly enlarged. No increase in right  ventricular wall thickness. Right ventricular systolic function is normal. There is normal pulmonary artery systolic pressure. The tricuspid regurgitant velocity is 2.46  m/s, and with an assumed right atrial pressure of 8 mmHg, the estimated right ventricular systolic pressure is 33.8 mmHg. Left Atrium: Left atrial size was normal in size. Right Atrium: Right atrial size was normal in size. Pericardium: There is no evidence of pericardial effusion. Mitral Valve: The mitral valve is normal in structure. There is mild calcification of the mitral valve leaflet(s). Mild mitral annular calcification. Trivial mitral valve regurgitation. No evidence of mitral valve stenosis. Tricuspid Valve: The tricuspid valve is normal in structure. Tricuspid valve regurgitation is mild. Aortic Valve: The aortic valve is tricuspid. Aortic valve regurgitation is not visualized. Mild aortic valve sclerosis is present, with no evidence of aortic valve stenosis. Pulmonic Valve: The pulmonic valve was normal in structure. Pulmonic valve regurgitation is not visualized. Aorta: The aortic root is normal in size and structure. Venous: The inferior vena cava is normal in size with less than 50% respiratory variability, suggesting right atrial pressure of 8 mmHg. IAS/Shunts: No atrial level shunt detected by color flow Doppler.  LEFT VENTRICLE PLAX 2D LVIDd:         3.50 cm  LVIDs:         2.60 cm LV PW:         1.40 cm LV IVS:        1.30 cm LVOT diam:     1.90 cm LV SV:         50 LV SV Index:   26 LVOT Area:     2.84 cm  LV Volumes (MOD) LV vol d, MOD A2C: 72.8 ml LV vol d, MOD A4C: 78.1 ml LV vol s, MOD A2C: 31.3 ml LV vol s, MOD A4C: 28.5 ml LV SV MOD A2C:     41.5 ml LV SV MOD A4C:     78.1 ml LV SV MOD BP:      47.8 ml RIGHT VENTRICLE             IVC RV S prime:     14.80 cm/s  IVC diam: 1.90 cm TAPSE (M-mode): 2.4 cm LEFT ATRIUM             Index       RIGHT ATRIUM           Index LA diam:        3.90 cm 2.07 cm/m  RA Area:     17.20 cm  LA Vol (A2C):   26.4 ml 14.00 ml/m RA Volume:   48.90 ml  25.93 ml/m LA Vol (A4C):   39.3 ml 20.84 ml/m LA Biplane Vol: 33.4 ml 17.71 ml/m  AORTIC VALVE LVOT Vmax:   113.00 cm/s LVOT Vmean:  73.900 cm/s LVOT VTI:    0.176 m  AORTA Ao Root diam: 3.10 cm Ao Asc diam:  2.80 cm MITRAL VALVE                TRICUSPID VALVE MV Area (PHT): 5.62 cm     TR Peak grad:   24.2 mmHg MV Decel Time: 135 msec     TR Vmax:        246.00 cm/s MV E velocity: 134.33 cm/s                             SHUNTS                             Systemic VTI:  0.18 m                             Systemic Diam: 1.90 cm Loralie Champagne MD Electronically signed by Loralie Champagne MD Signature Date/Time: 08/09/2020/5:26:25 PM    Final     Labs:  Basic Metabolic Panel: Recent Labs  Lab 08/13/20 1414 08/16/20 1355 08/18/20 1243  NA 136 136 139  K 4.6 5.9* 5.5*  CL 97* 96* 98  CO2 29 27 28   GLUCOSE 151* 103* 84  BUN 36* 50* 41*  CREATININE 8.69* 10.53* 9.17*  CALCIUM 8.7* 9.3 9.4  PHOS 2.2* 3.8 3.0    CBC: Recent Labs  Lab 08/13/20 1414 08/16/20 1354 08/18/20 1243  WBC 4.9 4.2 4.5  HGB 8.3* 8.9* 10.0*  HCT 25.5* 27.0* 31.1*  MCV 101.6* 101.5* 102.6*  PLT 199 237 237    CBG: No results for input(s): GLUCAP in the last 168 hours.  Family history.  Brother with kidney disease Father with myocardial infarction and kidney disease Sister with diabetes mellitus and hypertension.  Denies any  colon cancer esophageal cancer or rectal cancer  Brief HPI:   Karen [name] is a 65 y.o. right-handed female with history of diastolic congestive heart failure colitis atrial fibrillation end-stage renal disease hypertension right breast cancer 2019.  Lives with spouse.  Presented 08/02/2020 with a fall 3 weeks ago noted progressive headaches.  She did see her PCP CT of the head 07/15/2020 negative.  Cranial CT scan 08/02/2020 showed a large left subdural hematoma noted resulting in a 4 mm left-to-right midline shift.  Small left  tentorial subdural hematoma also noted as well as smaller right subdural hematoma.  Underwent left parietal craniotomy evacuation of subdural hematoma 08/02/2020 per Dr. Sherley Bounds.  Maintained on Keppra for seizure prophylaxis.  Decadron protocol as indicated.  Hemodialysis ongoing as directed per renal services.  Bouts of hypotension maintained on ProAmatine.  Therapy evaluations completed due to patient decreased functional mobility was admitted for a comprehensive rehab program.   Hospital Course: Alizzon Dioguardi was admitted to rehab 08/07/2020 for inpatient therapies to consist of PT, ST and OT at least three hours five days a week. Past admission physiatrist, therapy team and rehab RN have worked together to provide customized collaborative inpatient rehab.  Pertaining to patient's traumatic left subdural hematoma midline shift status postcraniotomy evacuation of subdural hematoma 08/02/2020.  Patient would follow-up neurosurgery.  Craniotomy site clean and dry.  Decadron protocol as indicated.  SCDs for DVT prophylaxis.  Pain management posttraumatic headache.  Hydrocodone as needed trials of Topamax with good results.  Keppra for seizure prophylaxis no seizure activity.  Atrial fibrillation cardiology follow-up Cardizem adjusted blood pressure controlled and monitored.  GERD Protonix as directed.  Patient remained on hemodialysis as directed per renal services.  Diastolic congestive heart failure exhibited no signs of fluid overload.  Chronic anemia latest hemoglobin 8.1 no bleeding episodes.   Blood pressures were monitored on TID basis and soft and monitored     Rehab course: During patient's stay in rehab weekly team conferences were held to monitor patient's progress, set goals and discuss barriers to discharge. At admission, patient required minimal assist under 20 feet rolling walker minimal assist sit to stand supervision supine to sit  Physical exam.  Blood pressure 134/73 pulse  71 temperature 97.4 respirations 14 oxygen saturations 1% room air Constitutional.  No acute distress Neurologic.  Alert no acute distress makes eye contact with examiner oriented x3.  Cranial nerves II through XII intact, motor strength 5/5 in left and 4/5 right deltoid bicep tricep grip hip flexor knee extension ankle dorsi plantarflexion. Head.  Craniotomy site clean and dry Eyes.  Pupils round and reactive to light no discharge without nystagmus Neck.  Supple nontender no JVD without thyromegaly Cardiac regular rate and rhythm not extra sounds or murmur heard Abdomen.  Soft nontender positive bowel sounds without rebound Respiratory effort normal no respiratory distress without wheeze  He/She  has had improvement in activity tolerance, balance, postural control as well as ability to compensate for deficits. He/She has had improvement in functional use RUE/LUE  and RLE/LLE as well as improvement in awareness.  Patient completed activities with right upper extremity able to wheelchair management with supervision.  Patient actively interacting with other group members by providing encouragement.  Ambulates 200 feet rolling walker contact-guard assist.  Speech-language pathology facilitated sessions by providing supervision level verbal cues for recall of her current medications and their functions.  Full family teaching completed plan discharged to home       Disposition:  Discharged to home   Diet: Renal diet  Special Instructions: No driving smoking or alcohol  Medications at discharge 1.  Tylenol as needed 2.  Lipitor 10 mg p.o. daily 3.  Cardizem 120 mg every Tuesday Thursday Saturday Sunday 4.  Cardizem 30 mg every Monday Wednesday Friday 5.  Hydrocodone 1 tablet daily as needed pain 6.  Keppra 250 mg p.o. twice daily 7.  ProAmatine 20 mg every Monday Wednesday Friday 8.  Multivitamin daily 9.  Protonix 40 mg p.o. twice daily 10.  MiraLAX daily hold for loose stools 11.   Renvela 800 mg p.o. 3 times daily with meals 12.  Topamax 50 mg p.o. twice daily  30-35 minutes were spent completing discharge summary and discharge planning  Discharge Instructions     Ambulatory referral to Physical Medicine Rehab   Complete by: As directed    Moderate complexity follow-up 1 to 2 weeks traumatic SDH        Follow-up Information     Meredith Staggers, MD Follow up.   Specialty: Physical Medicine and Rehabilitation Why: Office to call for appointment Contact information: [address] [place] [address] [phone]         Eustace Moore, MD Follow up.   Specialty: Neurosurgery Why: Call for appointment Contact information: [address] [place] [address] [phone]         Donato Heinz, MD Follow up.   Specialty: Nephrology Why: Call for appointment Contact information: [place] [address] [phone]         Lucianne Lei, MD. Call.   Specialty: Family Medicine Why: for post hospital follow up Contact information: [address] [place]  [address] [phone]                 Signed: Lavon Paganini Conrath 08/19/2020, 1:29 PM

## 2020-08-19 NOTE — Progress Notes (Signed)
PROGRESS NOTE   Subjective/Complaints: In fairly good spirits today. Excited to be going home. Headaches present but improving. She was in HD late last night. Had headache while in HD  ROS: Patient denies fever, rash, sore throat, blurred vision, nausea, vomiting, diarrhea, cough, shortness of breath or chest pain, joint or back pain,  or mood change.    Objective:   No results found. Recent Labs    08/18/20 1243  WBC 4.5  HGB 10.0*  HCT 31.1*  PLT 237   Recent Labs    08/18/20 1243  NA 139  K 5.5*  CL 98  CO2 28  GLUCOSE 84  BUN 41*  CREATININE 9.17*  CALCIUM 9.4     Intake/Output Summary (Last 24 hours) at 08/19/2020 1500 Last data filed at 08/19/2020 1345 Gross per 24 hour  Intake 360 ml  Output 2500 ml  Net -2140 ml        Physical Exam: Vital Signs Blood pressure 137/72, pulse 77, temperature 98.5 F (36.9 C), temperature source Oral, resp. rate 16, height 5\' 1"  (1.549 m), weight 88.3 kg, SpO2 99 %. Constitutional: No distress . Vital signs reviewed. HEENT: NCAT, EOMI, oral membranes moist Neck: supple Cardiovascular: RRR without murmur. No JVD    Respiratory/Chest: CTA Bilaterally without wheezes or rales. Normal effort    GI/Abdomen: BS +, non-tender, non-distended Ext: no clubbing, cyanosis, or edema Psych: pleasant and cooperative   Skin: left crani incision remains CDI with staples Neurologic: reasonable insight and awareness. motor strength is 5/5 in left 4+ right deltoid, bicep, tricep, grip, hip flexor, knee extensors, ankle dorsiflexor and plantar flexor. Good standing balance Normal sensory and cerebellar again.  Musculoskeletal: Full range of motion in all 4 extremities. No joint swelling, sternal, left chest pain reproducible with palpation   Assessment/Plan: 1. Functional deficits which require 3+ hours per day of interdisciplinary therapy in a comprehensive inpatient rehab  setting. Physiatrist is providing close team supervision and 24 hour management of active medical problems listed below. Physiatrist and rehab team continue to assess barriers to discharge/monitor patient progress toward functional and medical goals  Care Tool:  Bathing    Body parts bathed by patient: Right arm, Left arm, Chest, Abdomen, Front perineal area, Buttocks, Right upper leg, Left upper leg, Face, Right lower leg, Left lower leg   Body parts bathed by helper: Right lower leg, Left lower leg, Buttocks     Bathing assist Assist Level: Supervision/Verbal cueing     Upper Body Dressing/Undressing Upper body dressing   What is the patient wearing?: Pull over shirt    Upper body assist Assist Level: Set up assist    Lower Body Dressing/Undressing Lower body dressing      What is the patient wearing?: Underwear/pull up, Pants     Lower body assist Assist for lower body dressing: Supervision/Verbal cueing     Toileting Toileting Toileting Activity did not occur (Clothing management and hygiene only): N/A (no void or bm)  Toileting assist Assist for toileting: Supervision/Verbal cueing     Transfers Chair/bed transfer  Transfers assist     Chair/bed transfer assist level: Supervision/Verbal cueing Chair/bed transfer assistive device: Environmental consultant  Locomotion Ambulation   Ambulation assist      Assist level: Contact Guard/Touching assist Assistive device: Walker-rolling Max distance: >250 ft   Walk 10 feet activity   Assist     Assist level: Contact Guard/Touching assist Assistive device: Walker-rolling   Walk 50 feet activity   Assist Walk 50 feet with 2 turns activity did not occur: Safety/medical concerns (limited by lethargy/fatigue)  Assist level: Contact Guard/Touching assist Assistive device: Walker-rolling    Walk 150 feet activity   Assist Walk 150 feet activity did not occur: Safety/medical concerns  Assist level: Contact  Guard/Touching assist Assistive device: Walker-rolling    Walk 10 feet on uneven surface  activity   Assist Walk 10 feet on uneven surfaces activity did not occur: Safety/medical concerns   Assist level: Contact Guard/Touching assist Assistive device: Aeronautical engineer Will patient use wheelchair at discharge?: No   Wheelchair activity did not occur: Safety/medical concerns (limited assessment at eval due to lethargy/fatigue)         Wheelchair 50 feet with 2 turns activity    Assist    Wheelchair 50 feet with 2 turns activity did not occur: Safety/medical concerns       Wheelchair 150 feet activity     Assist  Wheelchair 150 feet activity did not occur: Safety/medical concerns       Blood pressure 137/72, pulse 77, temperature 98.5 F (36.9 C), temperature source Oral, resp. rate 16, height 5\' 1"  (1.549 m), weight 88.3 kg, SpO2 99 %.  Medical Problem List and Plan: 1.  Decreased functional mobility with gait disturbance secondary to traumatic left SDH with 4 mm left to right midline shift as well as small right subdural hematoma status post left parietal craniotomy evacuation of subdural hematoma 08/02/2020.  Tapered off from Decadron, discussed with neurosurgery yesterday              -patient may shower             -ELOS/Goals: 08/20/20, sup to mod I goals.   -Continue CIR therapies including PT, OT, and SLP   2.  Impaired mobility -DVT/anticoagulation: continue SCDs             -antiplatelet therapy: N/A 3. Pain Management/post-traumatic headache: Hydrocodone as needed  -continue topamax   50mg  bid  -tylenol prn 4. Mood: Vita motional support             -antipsychotic agents: N/A 5. Neuropsych: This patient is capable of making decisions on her own behalf. 6. Skin/Wound Care: dc staples 7/29 7. Fluids/Electrolytes/Nutrition: pt doesn't like renal restrictions  -liberalized diet with FR 8.  Seizure prophylaxis.  Keppra 250 mg  twice daily 9.  Atrial fibrillation/chest pain.  appreciate cards help.   -cardizem adjusted to 120mg  on non-HD days and 30 on HD days  -8/4 HR, BP controlled currently  -continue monitor with therapy activities  10.  Hypotension.  ProAmatine 20 mg Monday Wednesday Friday 11.  GERD.  Protonix- needs HOB up 30 deg at all times and upright during meals and 1 hour after  12.  End-stage renal disease/hyperkalemia.  Hemodialysis per renal service.  Continue Lokelma as directed  -HD after therapy day to allow maximal participation 13.  Diastolic congestive heart failure.  Monitor for any signs of fluid overload  -weights balanced    weight down, continue to monitor Filed Weights   08/17/20 0508 08/18/20 0328 08/19/20 0328  Weight: 89.2 kg 88.3 kg 88.3  kg     14. Anemia:   -likely acute on chronic.    -stool is OB+  -hgb 10 8/3  LOS: 12 days A FACE TO [place] 08/19/2020, 3:00 PM

## 2020-08-19 NOTE — Progress Notes (Signed)
Physical Therapy Discharge Summary  Patient Details  Name: Karen [name] MRN: [id number] Date of Birth: 31-May-1955  Today's Date: 08/19/2020 PT Individual Time: 1002-1101 PT Individual Time Calculation (min): 59 min    Patient has met 8 of 8 long term goals due to improved activity tolerance, improved balance, improved postural control, increased strength, and improved awareness.  Patient to discharge at an ambulatory level Modified Independent.   Reasons goals not met: NA  Recommendation:  Patient will benefit from ongoing skilled PT services in home health setting to continue to advance safe functional mobility, address ongoing impairments in strength, balance, transfers, ambulation, and minimize fall risk.  Equipment:  Recommended Rollator  Reasons for discharge: treatment goals met and discharge from hospital  Patient/family agrees with progress made and goals achieved: Yes  Skilled Therapeutic Interventions: Pt received seated in [facility] and agrees to therapy. No complaint of pain but mentions significant fatigue and being "ready to go home." Pt performs sit to stand to rolator at [employer]), then ambulates x400' to gym with rollator and improved gait speed and pattern. Pt takes seated rest break on rollator prior to completing car transfer with supervision and cues for sequencing. Pt completes ramp navigation with rollator at mod(I), then ambulates 150' to stairs. No rest break prior to completing x16 steps with L hand rail and close supervision. Pt performs bed mobility on mat independently with increased time required, then completes x2 steps without hand rail and with CGA to simulate entry into home. Pt ambulates x150' with rollator and left in ortho gym seated in [facility] with OT performing group activity.   PT Discharge Precautions/Restrictions Precautions Precautions: Fall Vision/Perception  Perception Perception: Within Functional Limits Praxis Praxis: Intact   Cognition Overall Cognitive Status: Impaired/Different from baseline Arousal/Alertness: Awake/alert Attention: Sustained Sustained Attention: Impaired Sustained Attention Impairment: Functional complex Memory: Impaired Memory Impairment: Decreased short term memory Decreased Short Term Memory: Functional complex Awareness: Appears intact Problem Solving: Impaired Problem Solving Impairment: Functional complex Safety/Judgment: Appears intact [employer] Scales of Cognitive Functioning: Purposeful/appropriate Sensation Sensation Light Touch: Appears Intact Proprioception: Appears Intact Coordination Gross Motor Movements are Fluid and Coordinated: No Fine Motor Movements are Fluid and Coordinated: Yes Coordination and Movement Description: Slow and deliberate, but improved from eval Finger Nose Finger Test: [facility] Motor  Motor Motor: Within Functional Limits  Mobility Bed Mobility Bed Mobility: Supine to Sit;Sit to Supine Rolling Right: Independent Rolling Left: Independent Supine to Sit: Independent Sit to Supine: Independent Transfers Transfers: Sit to Stand;Stand to Sit;Stand Pivot Transfers Sit to Stand: Independent with assistive device Stand to Sit: Independent with assistive device Stand Pivot Transfers: Independent with assistive device Transfer (Assistive device): Rollator Locomotion  Gait Ambulation: Yes Gait Assistance: Independent with assistive device Assistive device: Rollator Gait Gait: Yes Gait Pattern: Impaired Gait Pattern: Decreased stride length;Decreased hip/knee flexion - left;Decreased hip/knee flexion - right;Trunk flexed;Decreased trunk rotation Gait velocity: decreased Stairs / Additional Locomotion Stairs: Yes Stairs Assistance: Supervision/Verbal cueing Stair Management Technique: One rail Left Height of Stairs: 6 Ramp: Independent with assistive device Curb: Supervision/Verbal cueing Wheelchair Mobility Wheelchair Mobility: No   Trunk/Postural Assessment  Cervical Assessment Cervical Assessment:  (forward head) Thoracic Assessment Thoracic Assessment:  (rounded shoulders) Lumbar Assessment Lumbar Assessment:  (posterior pelvic tilt) Postural Control Postural Control: Deficits on evaluation (Decreased and delayed, but improved from eval)  Balance Balance Balance Assessed: Yes Dynamic Sitting Balance Dynamic Sitting - Balance Support: No upper extremity supported;Feet supported Dynamic Sitting - Level of Assistance: 7: Independent Static  Standing Balance Static Standing - Balance Support: During functional activity;Bilateral upper extremity supported Static Standing - Level of Assistance: 6: Modified independent (Device/Increase time) Dynamic Standing Balance Dynamic Standing - Balance Support: Bilateral upper extremity supported;During functional activity Dynamic Standing - Level of Assistance: 6: Modified independent (Device/Increase time) Extremity Assessment  RUE Assessment RUE Assessment: Within Functional Limits LUE Assessment LUE Assessment: Within Functional Limits RLE Assessment General Strength Comments: Grossly 4+/5 LLE Assessment General Strength Comments: Grossly 4+/5    Breck Coons PT, DPT 08/19/2020, 4:30 PM

## 2020-08-19 NOTE — Progress Notes (Addendum)
[facility] Progress Note   Subjective: Seen in room. Upset with night HD nurse, "It was awful" which is unusual for this pt as she rarely complaints- removed 2500. Excited about going home, swears she will take care of herself instead of focusing on caring for her husband. HD in AM 1st shift prior to dscharge. No C/O HA at present.   Objective Vitals:   08/19/20 0130 08/19/20 0200 08/19/20 0225 08/19/20 0328  BP: 126/67 121/65 122/63 137/72  Pulse: 69 70 73 77  Resp: 14 16 16 16   Temp:   98.4 F (36.9 C) 98.5 F (36.9 C)  TempSrc:    Oral  SpO2:    99%  Weight:    88.3 kg  Height:       Physical Exam General: Very pleasant older female in NAD HEENT: PERRLA. Sutures to L scalp Heart: S1,S2, No M/R/G Lungs: CTAB Abdomen: NABS Extremities: No LE edema.  Dialysis Access: R AVF+T/B    Additional Objective Labs: Basic Metabolic Panel: Recent Labs  Lab 08/13/20 1414 08/16/20 1355 08/18/20 1243  NA 136 136 139  K 4.6 5.9* 5.5*  CL 97* 96* 98  CO2 29 27 28   GLUCOSE 151* 103* 84  BUN 36* 50* 41*  CREATININE 8.69* 10.53* 9.17*  CALCIUM 8.7* 9.3 9.4  PHOS 2.2* 3.8 3.0   Liver Function Tests: Recent Labs  Lab 08/13/20 1414 08/16/20 1355 08/18/20 1243  ALBUMIN 2.6* 3.0* 3.4*   No results for input(s): LIPASE, AMYLASE in the last 168 hours. CBC: Recent Labs  Lab 08/12/20 1200 08/13/20 1414 08/16/20 1354 08/18/20 1243  WBC 4.5 4.9 4.2 4.5  HGB 8.8* 8.3* 8.9* 10.0*  HCT 27.5* 25.5* 27.0* 31.1*  MCV 100.4* 101.6* 101.5* 102.6*  PLT 227 199 237 237   Blood Culture    Component Value Date/Time   SDES BLOOD LEFT HAND 01/07/2018 0110   SPECREQUEST  01/07/2018 0110    BOTTLES DRAWN AEROBIC ONLY Blood Culture results may not be optimal due to an excessive volume of blood received in culture bottles   CULT  01/07/2018 0110    NO GROWTH 5 DAYS Performed at [facility] [address], [place], [place] [address]    REPTSTATUS 01/12/2018  FINAL 01/07/2018 0110    Cardiac Enzymes: No results for input(s): CKTOTAL, CKMB, CKMBINDEX, TROPONINI in the last 168 hours. CBG: No results for input(s): GLUCAP in the last 168 hours. Iron Studies: No results for input(s): IRON, TIBC, TRANSFERRIN, FERRITIN in the last 72 hours. @lablastinr3 @ Studies/Results: No results found. Medications:   atorvastatin  10 mg Oral QPM   Chlorhexidine Gluconate Cloth  6 each Topical Q0600   darbepoetin (ARANESP) injection - DIALYSIS  100 mcg Intravenous Q Wed-HD   diltiazem  120 mg Oral Q T,Th,S,Su-1800   And   diltiazem  30 mg Oral Q M,W,F-1800   HYDROcodone-acetaminophen       levETIRAcetam  250 mg Oral BID   midodrine  20 mg Oral Q M,W,F-HD   multivitamin  1 tablet Oral QHS   pantoprazole  40 mg Oral BID   polyethylene glycol  17 g Oral Daily   senna  1 tablet Oral BID   sevelamer carbonate  800 mg Oral TID WC   topiramate  50 mg Oral BID     Dialysis Orders: Okay  on MWF  EDW 86kg Bath 2K/2Ca 4 hrs Heparin 4000 units bolus. RUE AVF BFR 350 DFR 500    Hectoral 5 mcg  IV/HD,  Cinacalcet 30 mg with HD TIW   Assessment/Plan: Subdural hematoma L>R - s/p left parietal craniotomy and evacuation of hematoma.  S/p drain removal and staples placed.  Did note right SDH but was small. Now in CIR. Recurrent HA. CT did not show any new bleed -   she says headache better  ESRD: Back to usual MWF schedule via AVF. Next HD 1st shift 08/20/2020 prior to discharge  Hypertension/volume  - stable BP. On midodrine 20mg  qHD. Net UF 2.5 liters. post wt was 88.3. Get standing post wt with HD tomorrow. Volume status actually appears fine.   Anemia: Hgb 10.  Increased Aranesp 100 mcg IV q Mon. Tsat 35%, no iron needed.    Metabolic bone disease -  Labs at goal.  Nutrition - renal diet w/fluid restrictions. A fib - seen by cardiology, on cardizem.   GERD Diastolic congestive HF - fluid managed with dialysis. Repeat ECHO with EF 55-60% and indeterminiate  diastolic function Dispo - To d/c on 8/5 after HD   Rita H. Brown NP-C 08/19/2020, 9:24 AM  [facility] [phone]  Patient seen and examined, agree with above note with above modifications. HD late -  appeared to tolerate well by vitals-  doing well in rehab-  plan is for discharge after HD tomorrow.  Dialysis related labs stable and fine  Corliss Parish, MD 08/19/2020

## 2020-08-19 NOTE — Progress Notes (Signed)
Patient ID: Karen [name], female   DOB: 30-Dec-1955, 65 y.o.   MRN: [id number]  SW ordered rollator with [employer] via parachute. SW met with pt in room to discuss DME rec and challenges with HHA.    SW waiting on follow-up from [facility] and [place] about referral HHPT/OT/SLP/aide. *referral accepted by [facility].  SW met with pt in room and updated on above.   [place], MSW, [place] Office: [phone] Cell: [phone] Fax: [phone]

## 2020-08-19 NOTE — Progress Notes (Signed)
Occupational Therapy Discharge Summary  Patient Details  Name: Karen [name] MRN: [id number] Date of Birth: 1955-08-05  Today's Date: 08/19/2020 OT Individual Time: 0900-0930 OT Individual Time Calculation (min): 30 min   OT treatment session focused on increased independence with BADL tasks. Pt able to access dresser drawers, collect clothing, ambulate to bathroom with RW all without assist from OT. Bathing/dressing completed mod I with increased time. See functional navigator for further details.    Patient has met 10 of 10 long term goals due to improved activity tolerance, improved balance, postural control, ability to compensate for deficits, improved attention, improved awareness, and improved coordination.  Patient to discharge at overall Modified Independent/supervision level.    Reasons goals not met: n/a  Recommendation:  Patient will benefit from ongoing skilled OT services in home health setting to continue to advance functional skills in the area of BADL.  Equipment: No equipment provided  Reasons for discharge: treatment goals met and discharge from hospital  Patient/family agrees with progress made and goals achieved: Yes  OT Discharge Precautions/Restrictions  Precautions Precautions: Fall Pain  Pt reports headache. No number given. Rest for pain management.  ADL ADL Eating: Independent Where Assessed-Eating: Chair Grooming: Independent Where Assessed-Grooming: Sitting at sink Upper Body Bathing: Independent Where Assessed-Upper Body Bathing: Sitting at sink Lower Body Bathing: Supervision/safety Where Assessed-Lower Body Bathing: Sitting at sink, Standing at sink Upper Body Dressing: Modified independent (Device) Where Assessed-Upper Body Dressing: Chair Lower Body Dressing: Modified independent Where Assessed-Lower Body Dressing: Chair Toileting: Modified independent Where Assessed-Toileting: Electrical engineer Method: Counselling psychologist: Grab bars, Raised toilet seat ADL Comments: not cleared to shower Perception  Perception: Within Functional Limits Praxis Praxis: Intact Cognition Overall Cognitive Status: Impaired/Different from baseline Arousal/Alertness: Awake/alert Attention: Sustained Sustained Attention: Impaired Sustained Attention Impairment: Functional complex Memory: Impaired Memory Impairment: Decreased short term memory Decreased Short Term Memory: Functional complex Awareness: Appears intact Problem Solving: Impaired Problem Solving Impairment: Functional complex Safety/Judgment: Appears intact [employer] Scales of Cognitive Functioning: Purposeful/appropriate Sensation Sensation Light Touch: Appears Intact Proprioception: Appears Intact Coordination Gross Motor Movements are Fluid and Coordinated: No Fine Motor Movements are Fluid and Coordinated: Yes Coordination and Movement Description: Slow and deliberate, but improved from eval Finger Nose Finger Test: [facility] Motor  Motor Motor: Within Functional Limits Mobility  Bed Mobility Bed Mobility: Supine to Sit;Sit to Supine Rolling Right: Independent Rolling Left: Independent Supine to Sit: Independent Sit to Supine: Independent Transfers Sit to Stand: Independent with assistive device Stand to Sit: Independent with assistive device  Trunk/Postural Assessment  Cervical Assessment Cervical Assessment:  (forward head) Thoracic Assessment Thoracic Assessment:  (rounded shoulders) Lumbar Assessment Lumbar Assessment:  (posterior pelvic tilt) Postural Control Postural Control: Deficits on evaluation (Decreased and delayed, but improved from eval)  Balance Balance Balance Assessed: Yes Dynamic Sitting Balance Dynamic Sitting - Balance Support: No upper extremity supported;Feet supported Dynamic Sitting - Level of Assistance: 7: Independent Static Standing Balance Static  Standing - Balance Support: During functional activity;Bilateral upper extremity supported Static Standing - Level of Assistance: 6: Modified independent (Device/Increase time) Dynamic Standing Balance Dynamic Standing - Balance Support: Bilateral upper extremity supported;During functional activity Dynamic Standing - Level of Assistance: 6: Modified independent (Device/Increase time) Extremity/Trunk Assessment RUE Assessment RUE Assessment: Within Functional Limits LUE Assessment LUE Assessment: Within Functional Limits   Daneen Schick Kristinia Leavy 08/19/2020, 3:38 PM

## 2020-08-19 NOTE — Progress Notes (Signed)
Nutrition Follow-up  DOCUMENTATION CODES:   Obesity unspecified  INTERVENTION:  Continue Magic cup TID with meals, each supplement provides 290 kcal and 9 grams of protein.  Encourage adequate PO intake.   NUTRITION DIAGNOSIS:   Increased nutrient needs related to chronic illness (ESRD on HD) as evidenced by estimated needs; ongoing  GOAL:   Patient will meet greater than or equal to 90% of their needs; progressing  MONITOR:   PO intake, Supplement acceptance, Labs, Weight trends, I & O's  REASON FOR ASSESSMENT:   Consult Other (Comment) ("pt doesn't like any of renal food. could you please review some options which may be more appealing to her before we liberalize her diet?")  ASSESSMENT:   65 yo female with a PMH of diastolic CHF, colitis, A-fib, ESRD on HD MWF, HTN, R breast cancer (2019) who presents to rehab with traumatic subdural hematoma.  Meal completion has been 50-100% with 100% at breakfast this morning. Pt currently has Magic cup ordered at meals to aid in caloric and protein needs. Plans for discharge tomorrow after HD session.   Labs and medications reviewed.   Diet Order:   Diet Order             Diet regular Room service appropriate? Yes with Assist; Fluid consistency: Thin; Fluid restriction: 1200 mL Fluid  Diet effective now                   EDUCATION NEEDS:   Education needs have been addressed  Skin:  Skin Assessment: Reviewed RN Assessment Skin Integrity Issues:: Incisions Incisions: Head, closed  Last BM:  8/3  Height:   Ht Readings from Last 1 Encounters:  08/07/20 5\' 1"  (1.549 m)    Weight:   Wt Readings from Last 1 Encounters:  08/19/20 88.3 kg   BMI:  Body mass index is 36.78 kg/m.  Estimated Nutritional Needs:   Kcal:  2050-2250  Protein:  105-120 grams  Fluid:  1.2 L/day  Corrin Parker, MS, RD, LDN RD pager number/after hours weekend pager number on Amion.

## 2020-08-19 NOTE — Progress Notes (Signed)
Speech Language Pathology Discharge Summary  Patient Details  Name: Karen [name] MRN: [id number] Date of Birth: April 09, 1955  Today's Date: 08/19/2020 SLP Individual Time: 1300-1340 SLP Individual Time Calculation (min): 40 min   Skilled Therapeutic Interventions:  Skilled treatment session focused on cognitive goals. SLP facilitated session by  re-administering the [facility] Mental Status Examination (SLUMS). Patient scored  23/30 points with a score of 27 or above considered normal. Patient continues to demonstrate deficits in problem solving and working memory. However, significant improvements in short-term recall of functional information noted. Patient requested to use the bathroom and ambulated with supervision with the rollator without any safety concerns. Patient transferred back to bed at end of session. Patient left with alarm on and all needs within reach.    Patient has met 3 of 3 long term goals.  Patient to discharge at overall Supervision level.   Reasons goals not met: N/A   Clinical Impression/Discharge Summary: Patient has made functional gains and has met 3 of 3 LTGs this admission. Currently, patient demonstrates behaviors consistent with a Rancho Level VIII and requires intermittent supervision level verbal cues to complete functional and familiar tasks safely in regards to attention, problem solving and recall. Patient and family education has been completed and patient will discharge home with intermittent assistance from family. Patient would benefit from f/u SLP services to maximize her cognitive functioning and overall functional independence.   Care Partner:  Caregiver Able to Provide Assistance: Yes  Type of Caregiver Assistance: Physical;Cognitive  Recommendation:  Home Health SLP (Intermittent supervision)  Rationale for SLP Follow Up: Maximize cognitive function and independence   Equipment: N/A   Reasons for discharge: Discharged from  hospital;Treatment goals met   Patient/Family Agrees with Progress Made and Goals Achieved: Yes    Anneliese Leblond 08/19/2020, 6:26 AM

## 2020-08-20 LAB — RENAL FUNCTION PANEL
Albumin: 3 g/dL — ABNORMAL LOW (ref 3.5–5.0)
Anion gap: 10 (ref 5–15)
BUN: 31 mg/dL — ABNORMAL HIGH (ref 8–23)
CO2: 28 mmol/L (ref 22–32)
Calcium: 9 mg/dL (ref 8.9–10.3)
Chloride: 99 mmol/L (ref 98–111)
Creatinine, Ser: 7.69 mg/dL — ABNORMAL HIGH (ref 0.44–1.00)
GFR, Estimated: 5 mL/min — ABNORMAL LOW (ref >60)
Glucose, Bld: 110 mg/dL — ABNORMAL HIGH (ref 70–99)
Phosphorus: 3.2 mg/dL (ref 2.5–4.6)
Potassium: 4.9 mmol/L (ref 3.5–5.1)
Sodium: 137 mmol/L (ref 135–145)

## 2020-08-20 LAB — CBC
HCT: 28.4 % — ABNORMAL LOW (ref 36.0–46.0)
Hemoglobin: 9 g/dL — ABNORMAL LOW (ref 12.0–15.0)
MCH: 32.5 pg (ref 26.0–34.0)
MCHC: 31.7 g/dL (ref 30.0–36.0)
MCV: 102.5 fL — ABNORMAL HIGH (ref 80.0–100.0)
Platelets: 194 10*3/uL (ref 150–400)
RBC: 2.77 MIL/uL — ABNORMAL LOW (ref 3.87–5.11)
RDW: 14.6 % (ref 11.5–15.5)
WBC: 3.8 10*3/uL — ABNORMAL LOW (ref 4.0–10.5)
nRBC: 0 % (ref 0.0–0.2)

## 2020-08-20 MED ORDER — SEVELAMER CARBONATE 800 MG PO TABS: 800.0000 mg | ORAL_TABLET | Freq: Three times a day (TID) | ORAL | Status: AC

## 2020-08-20 MED ORDER — MIDODRINE HCL 10 MG PO TABS: 20.0000 mg | ORAL_TABLET | ORAL | 0 refills | Status: AC

## 2020-08-20 MED ORDER — PANTOPRAZOLE SODIUM 40 MG PO TBEC: 40.0000 mg | DELAYED_RELEASE_TABLET | Freq: Two times a day (BID) | ORAL | 0 refills | Status: DC

## 2020-08-20 MED ORDER — SODIUM CHLORIDE 0.9 % IV SOLN: 100.0000 mL | INTRAVENOUS | Status: DC | PRN

## 2020-08-20 MED ORDER — ACETAMINOPHEN 325 MG PO TABS
ORAL_TABLET | ORAL | Status: AC
  Administered 2020-08-20: 650 mg via ORAL
  Filled 2020-08-20: qty 2

## 2020-08-20 MED ORDER — HYDROCODONE-ACETAMINOPHEN 5-325 MG PO TABS: 1.0000 | ORAL_TABLET | Freq: Two times a day (BID) | ORAL | 0 refills | Status: DC | PRN

## 2020-08-20 MED ORDER — PENTAFLUOROPROP-TETRAFLUOROETH EX AERO: 1.0000 "application " | INHALATION_SPRAY | CUTANEOUS | Status: DC | PRN

## 2020-08-20 MED ORDER — CALCIUM CARBONATE ANTACID 500 MG PO CHEW
1.0000 | CHEWABLE_TABLET | Freq: Four times a day (QID) | ORAL | Status: DC | PRN
Start: 2020-08-20 — End: 2020-11-03

## 2020-08-20 MED ORDER — LIDOCAINE-PRILOCAINE 2.5-2.5 % EX CREA: 1.0000 "application " | TOPICAL_CREAM | CUTANEOUS | Status: DC | PRN

## 2020-08-20 MED ORDER — DILTIAZEM HCL ER COATED BEADS 120 MG PO CP24: 120.0000 mg | ORAL_CAPSULE | ORAL | 0 refills | Status: DC

## 2020-08-20 MED ORDER — LEVETIRACETAM 250 MG PO TABS: 250.0000 mg | ORAL_TABLET | Freq: Two times a day (BID) | ORAL | 0 refills | Status: DC

## 2020-08-20 MED ORDER — TOPIRAMATE 50 MG PO TABS: 50.0000 mg | ORAL_TABLET | Freq: Two times a day (BID) | ORAL | 0 refills | Status: DC

## 2020-08-20 MED ORDER — ALTEPLASE 2 MG IJ SOLR: 2.0000 mg | Freq: Once | INTRAMUSCULAR | Status: DC | PRN

## 2020-08-20 MED ORDER — LIDOCAINE HCL (PF) 1 % IJ SOLN: 5.0000 mL | INTRAMUSCULAR | Status: DC | PRN

## 2020-08-20 MED ORDER — ATORVASTATIN CALCIUM 10 MG PO TABS
10.0000 mg | ORAL_TABLET | Freq: Every evening | ORAL | 0 refills | Status: DC
Start: 2020-08-20 — End: 2020-09-13

## 2020-08-20 NOTE — Progress Notes (Signed)
PROGRESS NOTE   Subjective/Complaints: Out early for HD. No problems overnight. Anxious to get home  ROS: Patient denies fever, rash, sore throat, blurred vision, nausea, vomiting, diarrhea, cough, shortness of breath or chest pain, joint or back pain,  or mood change.    Objective:   No results found. Recent Labs    08/18/20 1243 08/20/20 0834  WBC 4.5 3.8*  HGB 10.0* 9.0*  HCT 31.1* 28.4*  PLT 237 194   Recent Labs    08/18/20 1243 08/20/20 0834  NA 139 137  K 5.5* 4.9  CL 98 99  CO2 28 28  GLUCOSE 84 110*  BUN 41* 31*  CREATININE 9.17* 7.69*  CALCIUM 9.4 9.0     Intake/Output Summary (Last 24 hours) at 08/20/2020 0921 Last data filed at 08/19/2020 1345 Gross per 24 hour  Intake 120 ml  Output --  Net 120 ml        Physical Exam: Vital Signs Blood pressure 111/72, pulse 79, temperature 98.5 F (36.9 C), temperature source Oral, resp. rate 16, height 5\' 1"  (1.549 m), weight 86.2 kg, SpO2 100 %. Constitutional: No distress . Vital signs reviewed. HEENT: NCAT, EOMI, oral membranes moist Neck: supple Cardiovascular: RRR without murmur. No JVD    Respiratory/Chest: CTA Bilaterally without wheezes or rales. Normal effort    GI/Abdomen: BS +, non-tender, non-distended Ext: no clubbing, cyanosis, or edema Psych: pleasant and cooperative  Skin: left crani incision CDI, staples out Neurologic: reasonable insight and awareness. motor strength is 5/5 in left 4+ right deltoid, bicep, tricep, grip, hip flexor, knee extensors, ankle dorsiflexor and plantar flexor. Good standing balance Normal sensory and cerebellar again.  Musculoskeletal: Full ROM, No pain with AROM or PROM in the neck, trunk, or extremities. Posture appropriate    Assessment/Plan: 1. Functional deficits which require 3+ hours per day of interdisciplinary therapy in a comprehensive inpatient rehab setting. Physiatrist is providing close team  supervision and 24 hour management of active medical problems listed below. Physiatrist and rehab team continue to assess barriers to discharge/monitor patient progress toward functional and medical goals  Care Tool:  Bathing    Body parts bathed by patient: Right arm, Left arm, Chest, Abdomen, Front perineal area, Buttocks, Right upper leg, Left upper leg, Face, Right lower leg, Left lower leg   Body parts bathed by helper: Right lower leg, Left lower leg, Buttocks     Bathing assist Assist Level: Supervision/Verbal cueing     Upper Body Dressing/Undressing Upper body dressing   What is the patient wearing?: Pull over shirt    Upper body assist Assist Level: Independent    Lower Body Dressing/Undressing Lower body dressing      What is the patient wearing?: Underwear/pull up, Pants     Lower body assist Assist for lower body dressing: Independent with assitive device     Toileting Toileting Toileting Activity did not occur (Clothing management and hygiene only): N/A (no void or bm)  Toileting assist Assist for toileting: Independent with assistive device     Transfers Chair/bed transfer  Transfers assist     Chair/bed transfer assist level: Independent with assistive device Chair/bed transfer assistive device: Gilford Rile  Locomotion Ambulation   Ambulation assist      Assist level: Independent with assistive device Assistive device: Rollator Max distance: 400'   Walk 10 feet activity   Assist     Assist level: Independent with assistive device Assistive device: Rollator   Walk 50 feet activity   Assist Walk 50 feet with 2 turns activity did not occur: Safety/medical concerns (limited by lethargy/fatigue)  Assist level: Independent with assistive device Assistive device: Rollator    Walk 150 feet activity   Assist Walk 150 feet activity did not occur: Safety/medical concerns  Assist level: Independent with assistive device Assistive  device: Rollator    Walk 10 feet on uneven surface  activity   Assist Walk 10 feet on uneven surfaces activity did not occur: Safety/medical concerns   Assist level: Independent with assistive device Assistive device: Rollator   Wheelchair     Assist Will patient use wheelchair at discharge?: No   Wheelchair activity did not occur: Safety/medical concerns (limited assessment at eval due to lethargy/fatigue)         Wheelchair 50 feet with 2 turns activity    Assist    Wheelchair 50 feet with 2 turns activity did not occur: Safety/medical concerns       Wheelchair 150 feet activity     Assist  Wheelchair 150 feet activity did not occur: Safety/medical concerns       Blood pressure 111/72, pulse 79, temperature 98.5 F (36.9 C), temperature source Oral, resp. rate 16, height 5\' 1"  (1.549 m), weight 86.2 kg, SpO2 100 %.  Medical Problem List and Plan: 1.  Decreased functional mobility with gait disturbance secondary to traumatic left SDH with 4 mm left to right midline shift as well as small right subdural hematoma status post left parietal craniotomy evacuation of subdural hematoma 08/02/2020.  Tapered off from [place], discussed with neurosurgery yesterday              -dc home today  -f/u [facility], renal, ns, primary 2.  Impaired mobility -DVT/anticoagulation: continue SCDs             -antiplatelet therapy: N/A 3. Pain Management/post-traumatic headache: Hydrocodone as needed  -continue topamax   50mg  bid  -tylenol prn 4. Mood: Vita motional support             -antipsychotic agents: N/A 5. Neuropsych: This patient is capable of making decisions on her own behalf. 6. Skin/Wound Care: dc staples 7/29 7. Fluids/Electrolytes/Nutrition: pt doesn't like renal restrictions  -liberalized diet with FR 8.  Seizure prophylaxis.  Keppra 250 mg twice daily 9.  Atrial fibrillation/chest pain.  appreciate cards help.   -cardizem adjusted to 120mg  on non-HD days  and 30 on HD days  -8/5 HR, BP controlled currently  -continue monitor with therapy activities  10.  Hypotension.  ProAmatine 20 mg Monday Wednesday Friday 11.  GERD.  Protonix- needs HOB up 30 deg at all times and upright during meals and 1 hour after  12.  End-stage renal disease/hyperkalemia.  Hemodialysis per renal service.  Continue Lokelma as directed  -HD to resume MWF as outpt 13.  Diastolic congestive heart failure.  Monitor for any signs of fluid overload  -weights balanced with HD    weight down, continue to monitor Filed Weights   08/19/20 0328 08/20/20 0423 08/20/20 0820  Weight: 88.3 kg 88.8 kg 86.2 kg     14. Anemia:   -likely acute on chronic.    -stool is OB+  -hgb  10 8/3 and 9.0 today  LOS: 13 days A FACE TO [place] 08/20/2020, 9:21 AM

## 2020-08-20 NOTE — Progress Notes (Signed)
Dialysis RN called [facility]) and received transfer reports , patient informed that dialysis will be done this morning

## 2020-08-20 NOTE — Progress Notes (Signed)
Inpatient Rehabilitation Care Coordinator Discharge Note  The overall goal for the admission was met for:   Discharge location: D/c to home with intermittent support.   Length of Stay: 12 days.   Discharge activity level: Mod I.  Home/community participation: Limited.  Services provided included: MD, RD, PT, OT, SLP, RN, CM, TR, Pharmacy, Neuropsych, and SW  Financial Services: Private Insurance: [employer] Medicare  Choices offered to/list presented to:Yes  Follow-up services arranged: Home Health: [facility] for HHPT/OT/SLP/aide, DME: [employer] for rollator, and Patient/Family request agency HH: [facility], DME: N/A  Comments (or additional information): *Pt was approved for [place] [employer] services out of service area transportation. Pt Renal SW at Clinic will discuss with patient further as in order to use services, it will require pt to change her dialysis seat time.   Patient/Family verbalized understanding of follow-up arrangements: Yes  Individual responsible for coordination of the follow-up plan: contact pt #[phone]  Confirmed correct DME delivered: Auria A Chamberlain 08/20/2020    Auria A Chamberlain 

## 2020-08-20 NOTE — Procedures (Addendum)
I was present at this dialysis session. I have reviewed the session itself and made appropriate changes.   Vital signs in last 24 hours:  Temp:  [98.2 F (36.8 C)-98.5 F (36.9 C)] 98.5 F (36.9 C) (08/05 0820) Pulse Rate:  [78-87] 82 (08/05 0820) Resp:  [16-18] 16 (08/05 0820) BP: (116-137)/(67-73) 116/73 (08/05 0820) SpO2:  [99 %-100 %] 100 % (08/05 0820) Weight:  [86.2 kg-88.8 kg] 86.2 kg (08/05 0820) Weight change: 0.5 kg Filed Weights   08/19/20 0328 08/20/20 0423 08/20/20 0820  Weight: 88.3 kg 88.8 kg 86.2 kg    Recent Labs  Lab 08/18/20 1243  NA 139  K 5.5*  CL 98  CO2 28  GLUCOSE 84  BUN 41*  CREATININE 9.17*  CALCIUM 9.4  PHOS 3.0    Recent Labs  Lab 08/13/20 1414 08/16/20 1354 08/18/20 1243  WBC 4.9 4.2 4.5  HGB 8.3* 8.9* 10.0*  HCT 25.5* 27.0* 31.1*  MCV 101.6* 101.5* 102.6*  PLT 199 237 237    Scheduled Meds:  atorvastatin  10 mg Oral QPM   Chlorhexidine Gluconate Cloth  6 each Topical Q0600   darbepoetin (ARANESP) injection - DIALYSIS  100 mcg Intravenous Q Wed-HD   diltiazem  120 mg Oral Q T,Th,S,Su-1800   And   diltiazem  30 mg Oral Q M,W,F-1800   levETIRAcetam  250 mg Oral BID   midodrine  20 mg Oral Q M,W,F-HD   multivitamin  1 tablet Oral QHS   pantoprazole  40 mg Oral BID   polyethylene glycol  17 g Oral Daily   senna  1 tablet Oral BID   sevelamer carbonate  800 mg Oral TID WC   topiramate  50 mg Oral BID   Continuous Infusions:  sodium chloride     sodium chloride     PRN Meds:.sodium chloride, sodium chloride, acetaminophen **OR** acetaminophen, alteplase, bisacodyl, calcium carbonate, HYDROcodone-acetaminophen, lidocaine (PF), lidocaine-prilocaine, ondansetron **OR** ondansetron (ZOFRAN) IV, pentafluoroprop-tetrafluoroeth     Dialysis Orders: [place]  on MWF  EDW 86kg Bath 2K/2Ca 4 hrs Heparin 4000 units bolus. RUE AVF BFR 350 DFR 500    Hectoral 5 mcg IV/HD,  Cinacalcet 30 mg with HD TIW   Assessment/Plan: Subdural hematoma  L>R - s/p left parietal craniotomy and evacuation of hematoma.  S/p drain removal and staples placed.  Did note right SDH but was small. Now in CIR. Recurrent HA. CT did not show any new bleed -   she says headache better  ESRD: Back to usual MWF schedule via AVF. No heparin with dialysis due to SDH.  Hypertension/volume  - stable BP. On midodrine 20mg  qHD. Net UF 2.5 liters. post wt was 88.3. Get standing post wt with HD tomorrow. Volume status actually appears fine.   Anemia: Hgb 10.  Increased Aranesp 100 mcg IV q Mon. Tsat 35%, no iron needed.    Metabolic bone disease -  Labs at goal.  Nutrition - renal diet w/fluid restrictions. A fib - seen by cardiology, on cardizem.   GERD Diastolic congestive HF - fluid managed with dialysis. Repeat ECHO with EF 55-60% and indeterminiate diastolic function Dispo - To d/c on 8/5 after HD   Donetta Potts,  MD 08/20/2020, 8:37 AM

## 2020-08-21 ENCOUNTER — Telehealth: Payer: Self-pay | Admitting: Nephrology

## 2020-08-21 NOTE — Telephone Encounter (Signed)
Transition of care contact from inpatient facility  Date of discharge: 08/20/20 Date of contact: 08/21/20 Method: Phone Spoke to: Patient  Patient contacted to discuss transition of care from recent inpatient hospitalization. Patient was admitted to [facility] from 08/02/20... with discharge diagnosis of .Marland KitchenLeft Acute on Chronic SDH  requiring Partial Craniotomy and then Rehab admit    Medication changes were reviewed.  Patient will follow up with his/her outpatient HD unit on: Monday  08/23/20

## 2020-08-24 ENCOUNTER — Ambulatory Visit (HOSPITAL_COMMUNITY): Payer: Medicare Other | Admitting: Physician Assistant

## 2020-08-24 ENCOUNTER — Encounter (HOSPITAL_COMMUNITY): Payer: Self-pay

## 2020-08-27 ENCOUNTER — Other Ambulatory Visit: Payer: Self-pay | Admitting: Physical Medicine and Rehabilitation

## 2020-09-06 ENCOUNTER — Other Ambulatory Visit (HOSPITAL_COMMUNITY): Payer: Self-pay

## 2020-09-06 MED ORDER — DILTIAZEM HCL ER COATED BEADS 120 MG PO CP24: 120.0000 mg | ORAL_CAPSULE | ORAL | 0 refills | Status: DC

## 2020-09-08 ENCOUNTER — Other Ambulatory Visit: Payer: Self-pay | Admitting: Neurological Surgery

## 2020-09-08 DIAGNOSIS — S065X9A Traumatic subdural hemorrhage with loss of consciousness of unspecified duration, initial encounter: Secondary | ICD-10-CM

## 2020-09-08 DIAGNOSIS — S065XAA Traumatic subdural hemorrhage with loss of consciousness status unknown, initial encounter: Secondary | ICD-10-CM

## 2020-09-09 ENCOUNTER — Encounter: Payer: Medicare Other | Attending: Registered Nurse | Admitting: Registered Nurse

## 2020-09-09 ENCOUNTER — Telehealth: Payer: Self-pay | Admitting: Registered Nurse

## 2020-09-09 ENCOUNTER — Encounter: Payer: Self-pay | Admitting: Registered Nurse

## 2020-09-09 ENCOUNTER — Other Ambulatory Visit: Payer: Self-pay

## 2020-09-09 ENCOUNTER — Other Ambulatory Visit (HOSPITAL_COMMUNITY): Payer: Self-pay | Admitting: *Deleted

## 2020-09-09 VITALS — BP 117/76 | HR 89 | Temp 98.2°F | Ht 61.0 in | Wt 189.4 lb

## 2020-09-09 DIAGNOSIS — Z992 Dependence on renal dialysis: Secondary | ICD-10-CM | POA: Diagnosis present

## 2020-09-09 DIAGNOSIS — N186 End stage renal disease: Secondary | ICD-10-CM | POA: Insufficient documentation

## 2020-09-09 DIAGNOSIS — I480 Paroxysmal atrial fibrillation: Secondary | ICD-10-CM | POA: Insufficient documentation

## 2020-09-09 DIAGNOSIS — Z9889 Other specified postprocedural states: Secondary | ICD-10-CM | POA: Insufficient documentation

## 2020-09-09 DIAGNOSIS — Z298 Encounter for other specified prophylactic measures: Secondary | ICD-10-CM | POA: Insufficient documentation

## 2020-09-09 MED ORDER — TOPIRAMATE 50 MG PO TABS: 50.0000 mg | ORAL_TABLET | Freq: Two times a day (BID) | ORAL | 1 refills | Status: DC

## 2020-09-09 NOTE — Telephone Encounter (Signed)
Placed a call to Ms. Mccleese, she reports she received a call from the Atrial Fibrillation clinic. She will have to pay out of pocket for the Cardizem . Her insurance will fill the Cardizem 120 mg on 09/16/2020. She states she will be calling her pharmacy to inquire about the cost. She is only taking the atorvastatin and will F/U with her PCP.

## 2020-09-09 NOTE — Progress Notes (Signed)
Subjective:    Patient ID: Karen [name], female    DOB: January 28, 1955, 65 y.o.   MRN: [id number]  HPI: Karen [name] is a 65 y.o. female who is here for Hospital Follow Up appointment for Follow up of her Traumatic Subdural Hematoma, S/P Craniotomy, Seizure Prophylaxis , Paroxysmal Atrial Fibrillation and ESRD on Hemodialysis.  Karen [name] presented to [facility] ED on 08/02/2020, with complaints of progressive headaches, since a fall she had three weeks ago. She had seen her PCP and a CT was ordered on 07/15/2020.  IMPRESSION: No acute abnormality no change from the prior CT. On 08/02/2020 CT without Contrast:  IMPRESSION: Large left subdural hematoma is noted resulting in 4 mm of left-to-right midline shift. Small left tentorial subdural hematoma is also noted, as well as smaller right subdural hematoma. Critical Value/emergent results were called by telephone at the time of interpretation on 08/02/2020 at 12:09 pm to provider [facility] , who verbally acknowledged these results.  Neurosurgery was consulted.  Karen [name] underwent on 08/02/2020 by Dr Ronnald Ramp:    CRANIOTOMY HEMATOMA EVACUATION SUBDURAL Left  She was maintained on Keppra for seizure prophylaxis. \ Nephrology following for ongoing Hemodialysis.   Karen [name] was admitted to inpatient rehabilitation on 08/07/2020 and discharged home on 08/20/2020. She is receiving Home Health Therapy with [facility]. She denies any pain. She rates her pain 0. She is walking with walker. Also reports her appetite is fair.   Karen [name] arrived to office frustrated regarding her medications, medication was reviewed. She states she has not taken her Cardizem 120 mg since Sunday, she is supposed to take Cardizem 120 mg on Sunday, Tuesday, Thursday and Saturday, she reports she only has two tablets. Whem asked why she didn't take her medication on Tuesday and today, she states she would be out of medication. This  provider had her call Roderic Palau NP office , ( [facility]),she spoke with someone in the office, and they will have someone return her call today. She has been compliant with Cardizem 30 mg on Monday, Wednesday and Friday. She was instructed to call this provider after she speaks with Roderic Palau NP, with an update, she verbalizes understanding.  She also had questions about Keppra, she was instructed to call Dr Ronnald Ramp office for clarification, she was instructed to continue medication as prescribed, until she speaks with  Dr Ronnald Ramp. Dr Ronnald Ramp  will clarify length of time, she verbalizes understanding.   Medication list was reviewed with Karen [name], all questions were answered. She verbalizes understanding.   She was instructed to call this provider with an update after speaking with her PCP. She verbalizes understanding.     Pain Inventory Average Pain  no pain Pain Right Now 0 My pain is intermittent and aching  LOCATION OF PAIN  Head & Neck  BOWEL Number of stools per week: 3-4 Oral laxative use No  Type of laxative none Enema or suppository use No  History of colostomy No  Incontinent No   BLADDER Dialysis    Mobility use a walker how many minutes can you walk? unknown ability to climb steps?  yes do you drive?  no Do you have any goals in this area?  yes  Function disabled: date disabled 2016 retired Do you have any goals in this area?  yes  Neuro/Psych weakness numbness trouble walking confusion  Prior Studies Any changes since last visit?  no, New Patient  Physicians involved in your care  Any changes since last visit?  no   Family History  Problem Relation Age of Onset   Kidney disease Mother    Heart attack Father    Kidney disease Father    Diabetes Sister    Hyperlipidemia Sister    Hypertension Sister    Kidney disease Sister        x2   Social History   Socioeconomic History   Marital status: Married    Spouse name:  Not on file   Number of children: 2   Years of education: Not on file   Highest education level: Not on file  Occupational History   Occupation: [id number]    Employer: [employer]  Tobacco Use   Smoking status: Never   Smokeless tobacco: Never  Vaping Use   Vaping Use: Never used  Substance and Sexual Activity   Alcohol use: No   Drug use: No   Sexual activity: Not on file    Comment: Hysterectomy  Other Topics Concern   Not on file  Social History Narrative   Not on file   Social Determinants of Health   Financial Resource Strain: Not on file  Food Insecurity: Not on file  Transportation Needs: Not on file  Physical Activity: Not on file  Stress: Not on file  Social Connections: Not on file   Past Surgical History:  Procedure Laterality Date   A/V FISTULAGRAM Right 04/22/2019   Procedure: A/V FISTULAGRAM - Right Upper;  Surgeon: Serafina Mitchell, MD;  Location: [place] [facility];  Service: Cardiovascular;  Laterality: Right;   A/V FISTULAGRAM N/A 02/17/2020   Procedure: A/V FISTULAGRAM - Right Upper;  Surgeon: Serafina Mitchell, MD;  Location: [place] [facility];  Service: Cardiovascular;  Laterality: N/A;   ABDOMINAL HYSTERECTOMY  2005   AV FISTULA PLACEMENT Left 12/09/2013   Procedure: INSERTION OF ARTERIOVENOUS (AV) GORE-TEX GRAFT ARM;  Surgeon: Elam Dutch, MD;  Location: [place];  Service: Vascular;  Laterality: Left;   AV FISTULA PLACEMENT Right 01/01/2018   Procedure: INSERTION OF ARTERIOVENOUS (AV) GORE-TEX GRAFT ARM RIGHT ARM;  Surgeon: Elam Dutch, MD;  Location: [place];  Service: Vascular;  Laterality: Right;   AV FISTULA PLACEMENT Right 09/17/2018   Procedure: 1ST STAGE BASILIC ARTERIOVENOUS (AV) FISTULA CREATION RIGHT ARM;  Surgeon: Waynetta Sandy, MD;  Location: [place];  Service: Vascular;  Laterality: Right;   [place] Right 11/12/2018   Procedure: SECOND STAGE BASILIC VEIN TRANSPOSITION RIGHT ARM;  Surgeon: Waynetta Sandy, MD;  Location: [place];  Service: Vascular;  Laterality: Right;   BREAST BIOPSY  1990's   BUNIONECTOMY Bilateral    CHOLECYSTECTOMY  2005   COLONOSCOPY     CRANIOTOMY Left 08/02/2020   Procedure: CRANIOTOMY HEMATOMA EVACUATION SUBDURAL;  Surgeon: Eustace Moore, MD;  Location: [place];  Service: Neurosurgery;  Laterality: Left;   FISTULOGRAM Right 06/12/2019   Procedure: Fistulogram of right upper arm arteriovenous fistula;  Surgeon: Serafina Mitchell, MD;  Location: [facility];  Service: Vascular;  Laterality: Right;   INSERTION OF ILIAC STENT  06/12/2019   Procedure: Insertion Of right basilic vein Stent;  Surgeon: Serafina Mitchell, MD;  [facility];  Service: Vascular;;   IR AV DIALY SHUNT INTRO [place] W/PTA/IMG RIGHT Right 03/04/2019   IR GENERIC HISTORICAL  01/11/2016   IR US GUIDE VASC ACCESS RIGHT 01/11/2016 Corrie Mckusick, DO [facility]   IR GENERIC HISTORICAL  01/11/2016  IR RADIOLOGY PERIPHERAL GUIDED IV START 01/11/2016 Corrie Mckusick, DO [facility]   IR IMAGING GUIDED PORT INSERTION  07/05/2017   IR REMOVAL TUN CV CATH W/O FL  08/12/2019   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   KNEE ARTHROSCOPY Bilateral    MASTECTOMY W/ SENTINEL NODE BIOPSY Right 11/20/2017   MASTECTOMY W/ SENTINEL NODE BIOPSY Right 11/20/2017   Procedure: RIGHT MASTECTOMY WITH SENTINEL LYMPH NODE BIOPSY;  Surgeon: Coralie Keens, MD;  Location: [place];  Service: General;  Laterality: Right;   PERIPHERAL VASCULAR BALLOON ANGIOPLASTY  02/17/2020   Procedure: PERIPHERAL VASCULAR BALLOON ANGIOPLASTY;  Surgeon: Serafina Mitchell, MD;  Location: [place] [facility];  Service: Cardiovascular;;   PERIPHERAL VASCULAR CATHETERIZATION N/A 08/31/2015   Procedure: A/V Shuntogram;  Surgeon: Serafina Mitchell, MD;  Location: [place] [facility];  Service: Cardiovascular;  Laterality: N/A;   PERIPHERAL VASCULAR CATHETERIZATION Left 08/31/2015   Procedure: Peripheral Vascular Balloon Angioplasty;   Surgeon: Serafina Mitchell, MD;  Location: [place] [facility];  Service: Cardiovascular;  Laterality: Left;  arm fistula   PERIPHERAL VASCULAR INTERVENTION Right 04/22/2019   Procedure: PERIPHERAL VASCULAR INTERVENTION;  Surgeon: Serafina Mitchell, MD;  Location: [place] [facility];  Service: Cardiovascular;  Laterality: Right;  FISTULA   PORT-A-CATH REMOVAL N/A 08/12/2018   Procedure: PORT-A-CATH REMOVAL;  Surgeon: Coralie Keens, MD;  Location: [place];  Service: General;  Laterality: N/A;   [place] Right 12/24/2017   Procedure: UPPER EXTREMITY VENOGRAPHY CENTRAL VENOGRAM;  Surgeon: Waynetta Sandy, MD;  Location: [place] [facility];  Service: Cardiovascular;  Laterality: Right;   UPPER EXTREMITY VENOGRAPHY Bilateral 09/02/2018   Procedure: UPPER EXTREMITY VENOGRAPHY;  Surgeon: Waynetta Sandy, MD;  Location: [place] [facility];  Service: Cardiovascular;  Laterality: Bilateral;   Past Medical History:  Diagnosis Date   Anemia    Arthritis    knees   Breast cancer ([place]) 2019   Right Breast Cancer   Cancer [facility])    Chest pain Jan 2016   low risk Myoview    CHF (congestive heart failure) ([place])    Colitis 12/08/2014   [facility]- focal moderate active colitis   Complication of anesthesia 2005   difficulty remembering for a while and waking up   Constipation    Dysrhythmia    h/o A-Fib   Elevated LFTs 2016   [facility]   ESRD on dialysis [facility]) April 2016   [facility] [place] [facility]   Fundic gland polyps of stomach, benign    GERD (gastroesophageal reflux disease)    Heart murmur Nov 2015   Aortic scleosis- no stenosis   Hemodialysis patient ([place])    Hypertension    Shortness of breath dyspnea    with exertion   BP 117/76   Pulse 89   Temp 98.2 F (36.8 C)   Ht 5\' 1"  (1.549 m)   Wt 189 lb 6.4 oz (85.9 kg)   LMP  (LMP Unknown)   SpO2 100%   BMI 35.79 kg/m   Opioid Risk Score:   Fall Risk Score:   `1  Depression screen PHQ 2/9  No flowsheet data found.  Review of Systems  Constitutional:  Positive for appetite change.  Eyes:  Positive for visual disturbance. Negative for photophobia.  Gastrointestinal:  Positive for nausea.  Musculoskeletal:  Positive for gait problem and neck pain.  Neurological:  Positive for weakness and headaches. Negative for numbness.  Psychiatric/Behavioral:  Positive for confusion.        Word association      Objective:   Physical Exam Vitals and nursing note reviewed.  Constitutional:      Appearance: Normal appearance.  Cardiovascular:     Rate and Rhythm: Normal rate and regular rhythm.     Pulses: Normal pulses.     Heart sounds: Normal heart sounds.  Pulmonary:     Effort: Pulmonary effort is normal.     Breath sounds: Normal breath sounds.  Musculoskeletal:     Cervical back: Normal range of motion and neck supple.     Comments: Normal Muscle Bulk and Muscle Testing Reveals:  Upper Extremities: Full ROM and Muscle Strength 5/5 Lower Extremities: Full ROM and Muscle Strength 5/5 Arises from Table Slowly using walker for support Antalgic Gait     Skin:    General: Skin is warm and dry.  Neurological:     Mental Status: She is alert and oriented to person, place, and time.  Psychiatric:        Mood and Affect: Mood normal.        Behavior: Behavior normal.         Assessment & Plan:  Traumatic Subdural Hematoma: S/P Craniotomy: on 08/02/2020: Dr Ronnald Ramp Following. Continue to Monitor. Continue Home Health Therapy with [facility] 2,.Seizure Prophylaxis: Continue Keppra: Neurosurgery Following. Continue to Monitor.  3. Paroxysmal Atrial Fibrillation: Call was placed to the office of Roderic Palau NP regarding Ms. Broder Cardizem 120 mg, she is awaiting a return call. She s taking her Cardizem 30 mg on Monday, Wednesday and Friday 4.ESRD on Hemodialysis. Nephrology Following. She dialyzes on Monday, Wednesday and Fridays.  Continue to Monitor.

## 2020-09-11 ENCOUNTER — Other Ambulatory Visit: Payer: Self-pay | Admitting: Physical Medicine and Rehabilitation

## 2020-09-13 ENCOUNTER — Telehealth: Payer: Self-pay | Admitting: Cardiovascular Disease

## 2020-09-13 ENCOUNTER — Telehealth: Payer: Self-pay

## 2020-09-13 MED ORDER — ATORVASTATIN CALCIUM 10 MG PO TABS: 10.0000 mg | ORAL_TABLET | Freq: Every evening | ORAL | 1 refills | Status: DC

## 2020-09-13 NOTE — Telephone Encounter (Signed)
Spoke with the pt and informed her that her simvastatin was discontinued while she was in the hospital and atorvastatin 10 mg po every evening was started. Informed the pt that she should be taking atorvastatin 10 mg po every evening, and stop taking simvastatin, according to discharge summary and progress notes from where she was recently admitted.  Pt is requesting a refill of this med to be sent to her confirmed pharmacy of choice.  Rx sent in for the pt. Pt verbalized understanding and agrees with this plan.

## 2020-09-13 NOTE — Telephone Encounter (Signed)
Note was reviewed.

## 2020-09-13 NOTE — Telephone Encounter (Signed)
Patient called with updates. Dr. Ronnald Ramp took her off Keppra, scheduled her for a CT September 2 at 3 pm and will look at starting Heparin after CT scan but he didn't respond to the question about driving yet. She has to call Cardiology about whether to stay on Atorvastatin or not.

## 2020-09-13 NOTE — Telephone Encounter (Signed)
Pt c/o medication issue:  1. Name of Medication: atorvastatin (LIPITOR) 10 MG tablet  2. How are you currently taking this medication (dosage and times per day)?   3. Are you having a reaction (difficulty breathing--STAT)?   4. What is your medication issue? Patient started taking this medication while she was in the hospital.   Dr. Gwenlyn Found put her on simvastatin (ZOCOR) 20 MG tablet She wants to know which medication she should take. Please advise

## 2020-09-14 ENCOUNTER — Other Ambulatory Visit: Payer: Self-pay

## 2020-09-14 ENCOUNTER — Ambulatory Visit (HOSPITAL_COMMUNITY): Payer: Medicare Other | Admitting: Physician Assistant

## 2020-09-14 ENCOUNTER — Ambulatory Visit (HOSPITAL_COMMUNITY)
Admission: RE | Admit: 2020-09-14 | Discharge: 2020-09-14 | Disposition: A | Discharge status: Home / Self Care | Payer: Medicare Other | Source: Ambulatory Visit | Attending: Nurse Practitioner | Admitting: Nurse Practitioner

## 2020-09-14 VITALS — BP 104/68 | HR 86 | Ht 61.0 in | Wt 190.8 lb

## 2020-09-14 DIAGNOSIS — I959 Hypotension, unspecified: Secondary | ICD-10-CM | POA: Insufficient documentation

## 2020-09-14 DIAGNOSIS — C801 Malignant (primary) neoplasm, unspecified: Secondary | ICD-10-CM | POA: Diagnosis not present

## 2020-09-14 DIAGNOSIS — Z8249 Family history of ischemic heart disease and other diseases of the circulatory system: Secondary | ICD-10-CM | POA: Insufficient documentation

## 2020-09-14 DIAGNOSIS — Z7901 Long term (current) use of anticoagulants: Secondary | ICD-10-CM | POA: Insufficient documentation

## 2020-09-14 DIAGNOSIS — S065X9A Traumatic subdural hemorrhage with loss of consciousness of unspecified duration, initial encounter: Secondary | ICD-10-CM | POA: Diagnosis not present

## 2020-09-14 DIAGNOSIS — Z79899 Other long term (current) drug therapy: Secondary | ICD-10-CM | POA: Insufficient documentation

## 2020-09-14 DIAGNOSIS — I480 Paroxysmal atrial fibrillation: Secondary | ICD-10-CM | POA: Diagnosis present

## 2020-09-14 NOTE — Addendum Note (Signed)
Encounter addended by: Sherran Needs, NP on: 09/14/2020 3:06 PM  Actions taken: Visit diagnoses modified

## 2020-09-14 NOTE — Progress Notes (Signed)
Primary Care Physician: Lucianne Lei, MD Referring Physician: [name] ER f/u Cardiologist: Dr. Mattie Marlin Karen [name] is a 65 y.o. female with a h/o ESRD on dialysis, failed transplant in 2016, HTN in the past but not treated for HTN currently,that has been having a  spells of palpitations during dialysis. Thuis is mentioned in her chart back to 2017. She was seen in the ED 11/24/15 and 12 /14/18 for same. She converted in the ER after just a few mins after arrival, ekg appeared to be be flutter. Ekg seen form 11/24/2015 flutter vrs fib. She is on ASA for a chadsvasc score of 1. She is on metoprolol 25 mg at hs only because her BP drops with dialysis if she takes her am dose. She has only had the arrhythmia following with dialysis.  F/u in afib clinic from ER, 12/20, she was given prn 30 mg Cardizem to use during dialysis and she did use once during dialysis but did not seem to help. Her nephrologist did mention that she could try 1/2 tab of 30 mg Cardizem prior to starting dialysis.I did talk to Dr. Rayann Heman and he is willing to consider primary ablation. She does have normal heart function by echo in 2017.   She saw Dr.Allred early this year and was offered ablation but she declined. 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 Now that she is post op several weeks, her BP's are running low. When she saw the kidney surgeon a few days ago, he mentioned for her to come here and get  her meds adjusted. During this time, she has not noted any afib, it has been quiet.  F/u afib clinic 5/9, on lat visit , metoprolol was adjusted as  well as Cardizem. She reports no afib and BP has been running much better, not so low.  Feels improved. She is currently happy with management.  F/u in afib clinic,  8/8, she unfortunately was diagnosed in June of this year with rt breast CA and is undergoing chemotherapy. She will be reassessed after chemo  for next step but pt thinks she will have a double mastectomy. She has not had any issues with afib.  F/U afib clinic 02/07/18. She is s/p right mastectomy, currently on chemotherapy. 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 She has not had any heart racing or palpitation symptoms. She continues to have issues with hypotension after dialysis.    F/u in afib clinic, 625/20. She is almost thru with chemo  for breast ca, 2 more treatments for which she is very happy about. She is not having any afib. She is having low blood pressure associated with dialysis. One of the nephrologists adjusted her dry weight this week to offset this. She is not symptomatic with her BP of 86/52 today. She is taking 30 mg of Cardizem after dialysis to ward off afib as she was having issues for awhile with afib right after dialysis.  F/u in afib clinic 05/15/19, at pt request. She started having intermittent mid chest discomfort with radiation into her throat  with a choking sensation   at will last several minutes at a time. She is very tired when the episode is over.  SHe  will have at least one of these spells daily.she  can have with rest or exertion. Not  associated with eating and will usually spontaneously  resolve. She saw Almyra Deforest,  [name], 4/2 with these symptoms. She  had a monitor and a stress test. The stress test was low risk. The monitor results are not back. She denies any irregularity to her heart rate or rhythm  with these episodes. She had a balloon to her rt arm fistula for low flow states 4/6 but had chest pain prior to that.   F/u in the afib clinic, 09/14/20. She was hospitalized 08/02/20 to 08/07/20 for a subdural hematoma s/p mechanical fall. She was trying to get a cobweb down and fell backward from the ladder.   Initial  w/u was negative for a bleed but then  pt developed an excruciating H/A in dialysis and was taken to the ER.  She was trying to get a cobweb down and fell backward from the ladder. She underwent a left sided craniology for an evacuation of the hematoma and tolerated the procedure well and d/c without complications. She was in rehab for around one week and has been home since then. She was not on asa or anticoagulation at the time of the fall.  She is still weak since the fall but gradually getting her strength back . She is not on anticoagulation for a CHA2DS2VASc  of 1. She is being seen back for refills on her Cardizem. She did not have afib with the incident and is in SR today.   Today, she denies symptoms of palpitations, chest pain, shortness of breath, orthopnea, PND, lower extremity edema, dizziness, presyncope, syncope, or neurologic sequela. The patient is tolerating medications without difficulties and is otherwise without complaint today.   Past Medical History:  Diagnosis Date   Anemia    Arthritis    knees   Breast cancer ([place]) 2019   Right Breast Cancer   Cancer [facility])    Chest pain Jan 2016   low risk Myoview    CHF (congestive heart failure) ([place])    Colitis 12/08/2014   [facility]- focal moderate active colitis   Complication of anesthesia 2005   difficulty remembering for a while and waking up   Constipation    Dysrhythmia    h/o A-Fib   Elevated LFTs 2016   [facility]   ESRD on dialysis [facility]) April 2016   [facility] [place]   Fundic gland polyps of stomach, benign    GERD (gastroesophageal reflux disease)    Heart murmur Nov 2015   Aortic scleosis- no stenosis   Hemodialysis patient [facility])    Hypertension    Shortness of breath dyspnea    with exertion   Past Surgical History:  Procedure Laterality Date   A/V FISTULAGRAM Right 04/22/2019   Procedure: A/V FISTULAGRAM - Right Upper;  Surgeon: Serafina Mitchell, MD;  Location: [place] [facility];   Service: Cardiovascular;  Laterality: Right;   A/V FISTULAGRAM N/A 02/17/2020   Procedure: A/V FISTULAGRAM - Right Upper;  Surgeon: Serafina Mitchell, MD;  Location: [place] [facility];  Service: Cardiovascular;  Laterality: N/A;   ABDOMINAL HYSTERECTOMY  2005   AV FISTULA PLACEMENT Left 12/09/2013   Procedure: INSERTION OF ARTERIOVENOUS (AV) GORE-TEX GRAFT ARM;  Surgeon: Elam Dutch, MD;  Location: [place];  Service: Vascular;  Laterality: Left;   AV FISTULA PLACEMENT Right 01/01/2018   Procedure: INSERTION OF ARTERIOVENOUS (AV) GORE-TEX GRAFT ARM RIGHT  ARM;  Surgeon: Elam Dutch, MD;  Location: [place];  Service: Vascular;  Laterality: Right;   AV FISTULA PLACEMENT Right 09/17/2018   Procedure: 1ST STAGE BASILIC ARTERIOVENOUS (AV) FISTULA CREATION RIGHT ARM;  Surgeon: Waynetta Sandy, MD;  Location: [place];  Service: Vascular;  Laterality: Right;   [place] Right 11/12/2018   Procedure: SECOND STAGE BASILIC VEIN TRANSPOSITION RIGHT ARM;  Surgeon: Waynetta Sandy, MD;  Location: [place];  Service: Vascular;  Laterality: Right;   BREAST BIOPSY  1990's   BUNIONECTOMY Bilateral    CHOLECYSTECTOMY  2005   COLONOSCOPY     CRANIOTOMY Left 08/02/2020   Procedure: CRANIOTOMY HEMATOMA EVACUATION SUBDURAL;  Surgeon: Eustace Moore, MD;  Location: [place];  Service: Neurosurgery;  Laterality: Left;   FISTULOGRAM Right 06/12/2019   Procedure: Fistulogram of right upper arm arteriovenous fistula;  Surgeon: Serafina Mitchell, MD;  Location[facility];  Service: Vascular;  Laterality: Right;   INSERTION OF ILIAC STENT  06/12/2019   Procedure: Insertion Of right basilic vein Stent;  Surgeon: Serafina Mitchell, MD;  Location: [place];  Service: Vascular;;   IR AV DIALY SHUNT INTRO NEEDLE/INTRACATH INITIAL W/PTA/IMG RIGHT Right 03/04/2019   IR GENERIC HISTORICAL  01/11/2016   IR US GUIDE VASC ACCESS RIGHT 01/11/2016 Corrie Mckusick, DO [facility]   IR GENERIC HISTORICAL  01/11/2016    IR RADIOLOGY PERIPHERAL GUIDED IV START 01/11/2016 Corrie Mckusick, DO [facility]   IR IMAGING GUIDED PORT INSERTION  07/05/2017   IR REMOVAL TUN CV CATH W/O FL  08/12/2019   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   KNEE ARTHROSCOPY Bilateral    MASTECTOMY W/ SENTINEL NODE BIOPSY Right 11/20/2017   MASTECTOMY W/ SENTINEL NODE BIOPSY Right 11/20/2017   Procedure: RIGHT MASTECTOMY WITH SENTINEL LYMPH NODE BIOPSY;  Surgeon: Coralie Keens, MD;  Location: [place];  Service: General;  Laterality: Right;   PERIPHERAL VASCULAR BALLOON ANGIOPLASTY  02/17/2020   Procedure: PERIPHERAL VASCULAR BALLOON ANGIOPLASTY;  Surgeon: Serafina Mitchell, MD;  Location: [place] [facility];  Service: Cardiovascular;;   PERIPHERAL VASCULAR CATHETERIZATION N/A 08/31/2015   Procedure: A/V Shuntogram;  Surgeon: Serafina Mitchell, MD;  Location: [place] [facility];  Service: Cardiovascular;  Laterality: N/A;   PERIPHERAL VASCULAR CATHETERIZATION Left 08/31/2015   Procedure: Peripheral Vascular Balloon Angioplasty;  Surgeon: Serafina Mitchell, MD;  Location: [place] [facility];  Service: Cardiovascular;  Laterality: Left;  arm fistula   PERIPHERAL VASCULAR INTERVENTION Right 04/22/2019   Procedure: PERIPHERAL VASCULAR INTERVENTION;  Surgeon: Serafina Mitchell, MD;  Location: [place] [facility];  Service: Cardiovascular;  Laterality: Right;  FISTULA   PORT-A-CATH REMOVAL N/A 08/12/2018   Procedure: PORT-A-CATH REMOVAL;  Surgeon: Coralie Keens, MD;  Location: [place];  Service: General;  Laterality: N/A;   [place] Right 12/24/2017   Procedure: UPPER EXTREMITY VENOGRAPHY CENTRAL VENOGRAM;  Surgeon: Waynetta Sandy, MD;  Location: [place] [facility];  Service: Cardiovascular;  Laterality: Right;   UPPER EXTREMITY VENOGRAPHY Bilateral 09/02/2018   Procedure: UPPER EXTREMITY VENOGRAPHY;  Surgeon: Waynetta Sandy, MD;  Location: [place] [facility];  Service: Cardiovascular;   Laterality: Bilateral;    Current Outpatient Medications  Medication Sig Dispense Refill   acetaminophen (TYLENOL) 325 MG tablet Take 2 tablets (650 mg total) by mouth every 4 (four) hours as needed for mild pain. (Patient taking differently: Take 650 mg by mouth as  needed for mild pain.)     amoxicillin (AMOXIL) 500 MG tablet Take 500 mg by mouth 2 (two) times daily.     atorvastatin (LIPITOR) 10 MG tablet Take 1 tablet (10 mg total) by mouth every evening. 90 tablet 1   calcium carbonate (TUMS - DOSED IN MG ELEMENTAL CALCIUM) 500 MG chewable tablet Chew 1 tablet (200 mg of elemental calcium total) by mouth every 6 (six) hours as needed for indigestion or heartburn.     Cinacalcet HCl (SENSIPAR PO) Take by mouth.     clobetasol cream (TEMOVATE) 6.83 % Apply 1 application topically daily as needed (for rash).      diltiazem (CARDIZEM CD) 120 MG 24 hr capsule Take 1 capsule (120 mg total) by mouth every Tuesday, Thursday, Saturday, and Sunday at 6 PM. 16 capsule 0   diltiazem (CARDIZEM) 30 MG tablet Take 1 tablet (30 mg total) by mouth every Monday, Wednesday, and Friday. 135 tablet 1   ethyl chloride spray Apply 1 application topically every Monday, Wednesday, and Friday with hemodialysis.     fluticasone (FLONASE) 50 MCG/ACT nasal spray Place 1 spray into both nostrils daily as needed for allergies.     lidocaine-prilocaine (EMLA) cream Apply 1 application topically See admin instructions. Apply topically Monday, Wednesday and Friday before dialysis  6   Methoxy PEG-Epoetin Beta (MIRCERA IJ) Mircera     midodrine (PROAMATINE) 10 MG tablet Take 2 tablets (20 mg total) by mouth every Monday, Wednesday, and Friday. Prior to dialysis 28 tablet 0   multivitamin (RENA-VIT) TABS tablet Take 1 tablet by mouth daily.      ondansetron (ZOFRAN) 8 MG tablet Take 1 tablet (8 mg total) by mouth every 8 (eight) hours as needed for nausea or vomiting. 20 tablet 0   pantoprazole (PROTONIX) 40 MG tablet TAKE 1  TABLET BY MOUTH TWICE A DAY 60 tablet 0   polyethylene glycol (MIRALAX / GLYCOLAX) 17 g packet Take 17 g by mouth daily. Purchase over the counter 14 each 0   prochlorperazine (COMPAZINE) 10 MG tablet Take 10 mg by mouth every 6 (six) hours as needed (Nausea or vomiting).     sevelamer carbonate (RENVELA) 800 MG tablet Take 1 tablet (800 mg total) by mouth 3 (three) times daily with meals.     topiramate (TOPAMAX) 50 MG tablet TAKE 1 TABLET BY MOUTH TWICE A DAY 60 tablet 1   No current facility-administered medications for this encounter.    No Known Allergies  Social History   Socioeconomic History   Marital status: Married    Spouse name: Not on file   Number of children: 2   Years of education: Not on file   Highest education level: Not on file  Occupational History   Occupation: [id number]    Employer: [employer]  Tobacco Use   Smoking status: Never   Smokeless tobacco: Never  Vaping Use   Vaping Use: Never used  Substance and Sexual Activity   Alcohol use: No   Drug use: No   Sexual activity: Not on file    Comment: Hysterectomy  Other Topics Concern   Not on file  Social History Narrative   Not on file   Social Determinants of Health   Financial Resource Strain: Not on file  Food Insecurity: Not on file  Transportation Needs: Not on file  Physical Activity: Not on file  Stress: Not on file  Social Connections: Not on file  Intimate Partner Violence: Not on file  Family History  Problem Relation Age of Onset   Kidney disease Mother    Heart attack Father    Kidney disease Father    Diabetes Sister    Hyperlipidemia Sister    Hypertension Sister    Kidney disease Sister        x2    ROS- All systems are reviewed and negative except as per the HPI above  Physical Exam: Vitals:   09/14/20 1411  Pulse: 86  Weight: 86.5 kg  Height: 5\' 1"  (1.549 m)   Wt Readings from Last 3 Encounters:  09/14/20 86.5 kg  09/09/20 85.9 kg  08/20/20  86.2 kg    Labs: Lab Results  Component Value Date   NA 137 08/20/2020   K 4.9 08/20/2020   CL 99 08/20/2020   CO2 28 08/20/2020   GLUCOSE 110 (H) 08/20/2020   BUN 31 (H) 08/20/2020   CREATININE 7.69 (H) 08/20/2020   CALCIUM 9.0 08/20/2020   PHOS 3.2 08/20/2020   MG 2.1 11/24/2015   Lab Results  Component Value Date   INR 1.1 08/02/2020   Lab Results  Component Value Date   CHOL 165 06/08/2015   HDL 59 06/08/2015   LDLCALC 97 06/08/2015   TRIG 44 06/08/2015     GEN- The patient is well appearing, alert and oriented x 3 today.   HEENT-head normocephalic, atraumatic, sclera clear, conjunctiva pink, hearing intact, trachea midline. Lungs- Clear to ausculation bilaterally, normal work of breathing Heart- Regular rate and rhythm, no murmurs, rubs or gallops  GI- soft, NT, ND, + BS Extremities- no clubbing, cyanosis, or edema MS- no significant deformity or atrophy Skin- no rash or lesion Psych- euthymic mood, full affect Neuro- strength and sensation are intact   EKG- NSR at 86  bpm, pr int 162 ms, qrs int 82 ms, qtc 476 ms Zio patch pending Myoview-Study Highlights    The left ventricular ejection fraction is hyperdynamic (>65%). Nuclear stress EF: 72%. Blood pressure demonstrated a normal response to exercise. There was no ST segment deviation noted during stress. This is a low risk study.     Epic records reviewed  Echo- 12/2017-Study Conclusions   - Left ventricle: The cavity size was normal. Systolic function was   normal. The estimated ejection fraction was in the range of 55%   to 60%. Wall motion was normal; there were no regional wall   motion abnormalities. Left ventricular diastolic function   parameters were normal. - Right atrium: Mobile calcified structur in RA not well visualized   may be calcified chiari network. - Atrial septum: No defect or patent foramen ovale was identified. - Pulmonary arteries: PA peak pressure: 33 mm Hg (S).      Assessment and Plan: 1. Paroxysmal afib  Has been very quiet  Pt has no  afib to report, very quiet for some time She would only be a candidate for amiodarone as far as AAD's 2/2 ESRD Offered ablation in the past but she declined  For now, anticoagulation not indicated for chadsvasc score of 1(female) Continue 1/2 of 30 mg  after dialysis, if needed. BP stable today.  2. Hypotension Related to dialysis, not symptomatic She is not on any meds that would contribute Stable today   3. Breast CA Has finished chemo   4.  Subdural Hematoma  S/p craniotomy and recovering nicely   I will see back   Butch Penny C. Donneisha Beane, [facility] [place], [place]  [id number] [phone]   

## 2020-09-17 ENCOUNTER — Ambulatory Visit
Admission: RE | Admit: 2020-09-17 | Discharge: 2020-09-17 | Disposition: A | Discharge status: Home / Self Care | Payer: Medicare Other | Source: Ambulatory Visit | Attending: Neurological Surgery | Admitting: Neurological Surgery

## 2020-09-17 ENCOUNTER — Other Ambulatory Visit: Payer: Self-pay

## 2020-09-17 DIAGNOSIS — S065XAA Traumatic subdural hemorrhage with loss of consciousness status unknown, initial encounter: Secondary | ICD-10-CM

## 2020-09-17 DIAGNOSIS — S065X9A Traumatic subdural hemorrhage with loss of consciousness of unspecified duration, initial encounter: Secondary | ICD-10-CM

## 2020-09-18 ENCOUNTER — Other Ambulatory Visit (HOSPITAL_COMMUNITY): Payer: Self-pay | Admitting: Nurse Practitioner

## 2020-10-09 ENCOUNTER — Other Ambulatory Visit: Payer: Self-pay | Admitting: Physical Medicine and Rehabilitation

## 2020-10-11 ENCOUNTER — Other Ambulatory Visit: Payer: Self-pay | Admitting: Physical Medicine & Rehabilitation

## 2020-10-14 ENCOUNTER — Encounter: Payer: Self-pay | Admitting: Hematology and Oncology

## 2020-10-14 ENCOUNTER — Encounter (HOSPITAL_COMMUNITY): Payer: Self-pay

## 2020-10-21 ENCOUNTER — Encounter: Payer: Self-pay | Admitting: Podiatry

## 2020-10-21 ENCOUNTER — Ambulatory Visit (INDEPENDENT_AMBULATORY_CARE_PROVIDER_SITE_OTHER): Payer: Medicare Other | Admitting: Podiatry

## 2020-10-21 ENCOUNTER — Other Ambulatory Visit: Payer: Self-pay

## 2020-10-21 DIAGNOSIS — B351 Tinea unguium: Secondary | ICD-10-CM | POA: Diagnosis not present

## 2020-10-21 DIAGNOSIS — M79674 Pain in right toe(s): Secondary | ICD-10-CM | POA: Diagnosis not present

## 2020-10-25 NOTE — Progress Notes (Signed)
Subjective:   Patient ID: Karen [name], female   DOB: 65 y.o.   MRN: [id number]   HPI Patient presents with extreme thickness and deformity of the right hallux nail bed that has been moderately painful and difficult to wear shoe gear with.  States she cannot cut it herself and is interested in what can be done short-term and long-term.  Patient does not smoke likes to be active   Review of Systems  All other systems reviewed and are negative.      Objective:  Physical Exam Vitals and nursing note reviewed.  Constitutional:      Appearance: She is well-developed.  Pulmonary:     Effort: Pulmonary effort is normal.  Musculoskeletal:        General: Normal range of motion.  Skin:    General: Skin is warm.  Neurological:     Mental Status: She is alert.    Neurovascular status found to be intact muscle strength was found to be adequate range of motion adequate.  Patient is found to have extreme thickness of the right hallux nail that is dystrophic and moderately tender when pressed.  Patient is found to have good digital perfusion well oriented x3     Assessment:  Date managed right hallux nail that is dystrophic and difficult to wear shoe gear with     Plan:  H&P reviewed condition recommended long-term removal of nail but she does not want it at this time so aggressive debridement accomplished with sterile instrument Tatian with no iatrogenic bleeding.  Discussed and explained to her the permanent procedure which could be done at 1 point in future

## 2020-10-28 ENCOUNTER — Ambulatory Visit: Payer: Medicare Other | Admitting: Podiatry

## 2020-11-03 ENCOUNTER — Encounter: Payer: Medicare Other | Attending: Registered Nurse | Admitting: Physical Medicine & Rehabilitation

## 2020-11-03 ENCOUNTER — Other Ambulatory Visit: Payer: Self-pay

## 2020-11-03 ENCOUNTER — Encounter: Payer: Self-pay | Admitting: Physical Medicine & Rehabilitation

## 2020-11-03 VITALS — BP 117/76 | HR 73 | Temp 98.6°F | Ht 61.0 in | Wt 182.0 lb

## 2020-11-03 DIAGNOSIS — D649 Anemia, unspecified: Secondary | ICD-10-CM

## 2020-11-03 DIAGNOSIS — N186 End stage renal disease: Secondary | ICD-10-CM

## 2020-11-03 DIAGNOSIS — S065X2S Traumatic subdural hemorrhage with loss of consciousness of 31 minutes to 59 minutes, sequela: Secondary | ICD-10-CM

## 2020-11-03 DIAGNOSIS — G44319 Acute post-traumatic headache, not intractable: Secondary | ICD-10-CM

## 2020-11-03 NOTE — Patient Instructions (Addendum)
DIARRHEA:  STOP PROTONIX  TRY PROBIOTIC TO HELP BUILD UP THE NORMAL BACTERIA IN YOUR GUT WHICH HELP YOU DIGEST. YOU CAN TAKE CAPSULES/PILLS AND IT'S ALSO IN GREEK YOGURT.    WEAN TOPAMAX TO OFF: TAKE 50MG  FOR 2 WEEKS THEN STOP.   Read articles and see if you can comprehend what you read. Try some crosswords and puzzles for your brain.

## 2020-11-03 NOTE — Progress Notes (Signed)
Subjective:    Patient ID: Karen [name], female    DOB: [birth year], 65 y.o.   MRN: [id number]  HPI  Karen [name] is here in follow up of her TBI and crani. She left [place] in august. Her injury was in July. When she first left home, she experienced a lot of diarrhea. It has improved but she is still notices dumping at times after she eats.  Her family physician did some work-up for the stooling and cultures were negative.  She remains on essentially the medication she was on when she left rehab.  She is no longer taking MiraLAX.  She remains on Protonix for reflux symptoms but only is taking it once per day.  From a cognitive standpoint she feels that she is continue to improve.  She still has some problems with some short-term memory and occasionally with processing of information but she tries to take her time and typically she is available to work through things.  Headaches have essentially resolved.  She will notice a headache when she is tired or when she has not eaten.  She still is on Topamax 50 mg twice daily.  Remains on hemodialysis for end-stage renal disease.    Pain Inventory Average Pain 0 Pain Right Now 0 My pain is intermittent, aching, and stiffness  (knees)  In the last 24 hours, has pain interfered with the following? General activity 0 Relation with others 0 Enjoyment of life 0 What TIME of day is your pain at its worst? varies Sleep (in general) Fair  Pain is worse with: walking, bending, sitting, standing, and some activites, coldness Pain improves with: rest and heat Relief from Meds:  no medication taken for knee pain  Family History  Problem Relation Age of Onset   Kidney disease Mother    Heart attack Father    Kidney disease Father    Diabetes Sister    Hyperlipidemia Sister    Hypertension Sister    Kidney disease Sister        x2   Social History   Socioeconomic History   Marital status: Married    Spouse name: Not on file   Number  of children: 2   Years of education: Not on file   Highest education level: Not on file  Occupational History   Occupation: SHERIFF'S OFFICE    Employer: [employer]  Tobacco Use   Smoking status: Never   Smokeless tobacco: Never  Vaping Use   Vaping Use: Never used  Substance and Sexual Activity   Alcohol use: No   Drug use: No   Sexual activity: Not on file    Comment: Hysterectomy  Other Topics Concern   Not on file  Social History Narrative   Not on file   Social Determinants of Health   Financial Resource Strain: Not on file  Food Insecurity: Not on file  Transportation Needs: Not on file  Physical Activity: Not on file  Stress: Not on file  Social Connections: Not on file   Past Surgical History:  Procedure Laterality Date   A/V FISTULAGRAM Right 04/22/2019   Procedure: A/V FISTULAGRAM - Right Upper;  Surgeon: Serafina Mitchell, MD;  Location: [place] [facility];  Service: Cardiovascular;  Laterality: Right;   A/V FISTULAGRAM N/A 02/17/2020   Procedure: A/V FISTULAGRAM - Right Upper;  Surgeon: Serafina Mitchell, MD;  Location: [place] [facility];  Service: Cardiovascular;  Laterality: N/A;   ABDOMINAL HYSTERECTOMY  2005   AV  FISTULA PLACEMENT Left 12/09/2013   Procedure: INSERTION OF ARTERIOVENOUS (AV) GORE-TEX GRAFT ARM;  Surgeon: Elam Dutch, MD;  Location: [place];  Service: Vascular;  Laterality: Left;   AV FISTULA PLACEMENT Right 01/01/2018   Procedure: INSERTION OF ARTERIOVENOUS (AV) GORE-TEX GRAFT ARM RIGHT ARM;  Surgeon: Elam Dutch, MD;  Location: [place];  Service: Vascular;  Laterality: Right;   AV FISTULA PLACEMENT Right 09/17/2018   Procedure: 1ST STAGE BASILIC ARTERIOVENOUS (AV) FISTULA CREATION RIGHT ARM;  Surgeon: Waynetta Sandy, MD;  Location: [place];  Service: Vascular;  Laterality: Right;   [place] Right 11/12/2018   Procedure: SECOND STAGE BASILIC VEIN TRANSPOSITION RIGHT ARM;  Surgeon: Waynetta Sandy,  MD;  Location: [place];  Service: Vascular;  Laterality: Right;   BREAST BIOPSY  1990's   BUNIONECTOMY Bilateral    CHOLECYSTECTOMY  2005   COLONOSCOPY     CRANIOTOMY Left 08/02/2020   Procedure: CRANIOTOMY HEMATOMA EVACUATION SUBDURAL;  Surgeon: Eustace Moore, MD;  Location: [place];  Service: Neurosurgery;  Laterality: Left;   FISTULOGRAM Right 06/12/2019   Procedure: Fistulogram of right upper arm arteriovenous fistula;  Surgeon: Serafina Mitchell, MD;  Location: [facility];  Service: Vascular;  Laterality: Right;   INSERTION OF ILIAC STENT  06/12/2019   Procedure: Insertion Of right basilic vein Stent;  Surgeon: Serafina Mitchell, MD;  Location: [place];  Service: Vascular;;   IR AV DIALY SHUNT INTRO NEEDLE/INTRACATH INITIAL W/PTA/IMG RIGHT Right 03/04/2019   IR GENERIC HISTORICAL  01/11/2016   IR US GUIDE VASC ACCESS RIGHT 01/11/2016 Corrie Mckusick, DO [facility]   IR GENERIC HISTORICAL  01/11/2016   IR RADIOLOGY PERIPHERAL GUIDED IV START 01/11/2016 Corrie Mckusick, DO [facility]   IR IMAGING GUIDED PORT INSERTION  07/05/2017   IR REMOVAL TUN CV CATH W/O FL  08/12/2019   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   KNEE ARTHROSCOPY Bilateral    MASTECTOMY W/ SENTINEL NODE BIOPSY Right 11/20/2017   MASTECTOMY W/ SENTINEL NODE BIOPSY Right 11/20/2017   Procedure: RIGHT MASTECTOMY WITH SENTINEL LYMPH NODE BIOPSY;  Surgeon: Coralie Keens, MD;  Location: [place];  Service: General;  Laterality: Right;   PERIPHERAL VASCULAR BALLOON ANGIOPLASTY  02/17/2020   Procedure: PERIPHERAL VASCULAR BALLOON ANGIOPLASTY;  Surgeon: Serafina Mitchell, MD;  Location: [place] [facility];  Service: Cardiovascular;;   PERIPHERAL VASCULAR CATHETERIZATION N/A 08/31/2015   Procedure: A/V Shuntogram;  Surgeon: Serafina Mitchell, MD;  Location: [place] [facility];  Service: Cardiovascular;  Laterality: N/A;   PERIPHERAL VASCULAR CATHETERIZATION Left 08/31/2015   Procedure: Peripheral Vascular Balloon Angioplasty;  Surgeon: Serafina Mitchell, MD;  Location: [place] [facility];  Service: Cardiovascular;  Laterality: Left;  arm fistula   PERIPHERAL VASCULAR INTERVENTION Right 04/22/2019   Procedure: PERIPHERAL VASCULAR INTERVENTION;  Surgeon: Serafina Mitchell, MD;  Location: [place] [facility];  Service: Cardiovascular;  Laterality: Right;  FISTULA   PORT-A-CATH REMOVAL N/A 08/12/2018   Procedure: PORT-A-CATH REMOVAL;  Surgeon: Coralie Keens, MD;  Location: [place];  Service: General;  Laterality: N/A;   [place] Right 12/24/2017   Procedure: UPPER EXTREMITY VENOGRAPHY CENTRAL VENOGRAM;  Surgeon: Waynetta Sandy, MD;  Location: [place] [facility];  Service: Cardiovascular;  Laterality: Right;   UPPER EXTREMITY VENOGRAPHY Bilateral 09/02/2018   Procedure: UPPER EXTREMITY VENOGRAPHY;  Surgeon: Waynetta Sandy, MD;  Location: [place] [facility];  Service:  Cardiovascular;  Laterality: Bilateral;   Past Surgical History:  Procedure Laterality Date   A/V FISTULAGRAM Right 04/22/2019   Procedure: A/V FISTULAGRAM - Right Upper;  Surgeon: Serafina Mitchell, MD;  Location: [place] [facility];  Service: Cardiovascular;  Laterality: Right;   A/V FISTULAGRAM N/A 02/17/2020   Procedure: A/V FISTULAGRAM - Right Upper;  Surgeon: Serafina Mitchell, MD;  Location: [place] [facility];  Service: Cardiovascular;  Laterality: N/A;   ABDOMINAL HYSTERECTOMY  2005   AV FISTULA PLACEMENT Left 12/09/2013   Procedure: INSERTION OF ARTERIOVENOUS (AV) GORE-TEX GRAFT ARM;  Surgeon: Elam Dutch, MD;  Location: [place];  Service: Vascular;  Laterality: Left;   AV FISTULA PLACEMENT Right 01/01/2018   Procedure: INSERTION OF ARTERIOVENOUS (AV) GORE-TEX GRAFT ARM RIGHT ARM;  Surgeon: Elam Dutch, MD;  Location: [place];  Service: Vascular;  Laterality: Right;   AV FISTULA PLACEMENT Right 09/17/2018   Procedure: 1ST STAGE BASILIC ARTERIOVENOUS (AV) FISTULA CREATION RIGHT ARM;  Surgeon: Waynetta Sandy, MD;  Location: [place];  Service: Vascular;  Laterality: Right;   [place] Right 11/12/2018   Procedure: SECOND STAGE BASILIC VEIN TRANSPOSITION RIGHT ARM;  Surgeon: Waynetta Sandy, MD;  Location: [place];  Service: Vascular;  Laterality: Right;   BREAST BIOPSY  1990's   BUNIONECTOMY Bilateral    CHOLECYSTECTOMY  2005   COLONOSCOPY     CRANIOTOMY Left 08/02/2020   Procedure: CRANIOTOMY HEMATOMA EVACUATION SUBDURAL;  Surgeon: Eustace Moore, MD;  Location: [place];  Service: Neurosurgery;  Laterality: Left;   FISTULOGRAM Right 06/12/2019   Procedure: Fistulogram of right upper arm arteriovenous fistula;  Surgeon: Serafina Mitchell, MD;  Location: [facility];  Service: Vascular;  Laterality: Right;   INSERTION OF ILIAC STENT  06/12/2019   Procedure: Insertion Of right basilic vein Stent;  Surgeon: Serafina Mitchell, MD;  Location: [place];  Service: Vascular;;   IR AV DIALY SHUNT INTRO NEEDLE/INTRACATH INITIAL W/PTA/IMG RIGHT Right 03/04/2019   IR GENERIC HISTORICAL  01/11/2016   IR US GUIDE VASC ACCESS RIGHT 01/11/2016 Corrie Mckusick, DO [facility]   IR GENERIC HISTORICAL  01/11/2016   IR RADIOLOGY PERIPHERAL GUIDED IV START 01/11/2016 Corrie Mckusick, DO [facility]   IR IMAGING GUIDED PORT INSERTION  07/05/2017   IR REMOVAL TUN CV CATH W/O FL  08/12/2019   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   KNEE ARTHROSCOPY Bilateral    MASTECTOMY W/ SENTINEL NODE BIOPSY Right 11/20/2017   MASTECTOMY W/ SENTINEL NODE BIOPSY Right 11/20/2017   Procedure: RIGHT MASTECTOMY WITH SENTINEL LYMPH NODE BIOPSY;  Surgeon: Coralie Keens, MD;  Location: [place];  Service: General;  Laterality: Right;   PERIPHERAL VASCULAR BALLOON ANGIOPLASTY  02/17/2020   Procedure: PERIPHERAL VASCULAR BALLOON ANGIOPLASTY;  Surgeon: Serafina Mitchell, MD;  Location: [place] [facility];  Service: Cardiovascular;;   PERIPHERAL VASCULAR CATHETERIZATION N/A 08/31/2015   Procedure: A/V Shuntogram;   Surgeon: Serafina Mitchell, MD;  Location: [place] [facility];  Service: Cardiovascular;  Laterality: N/A;   PERIPHERAL VASCULAR CATHETERIZATION Left 08/31/2015   Procedure: Peripheral Vascular Balloon Angioplasty;  Surgeon: Serafina Mitchell, MD;  Location: [place] [facility];  Service: Cardiovascular;  Laterality: Left;  arm fistula   PERIPHERAL VASCULAR INTERVENTION Right 04/22/2019   Procedure: PERIPHERAL VASCULAR INTERVENTION;  Surgeon: Serafina Mitchell, MD;  Location: [place] [facility];  Service: Cardiovascular;  Laterality: Right;  FISTULA   PORT-A-CATH REMOVAL N/A 08/12/2018  Procedure: PORT-A-CATH REMOVAL;  Surgeon: Coralie Keens, MD;  Location: [place];  Service: General;  Laterality: N/A;   [place] Right 12/24/2017   Procedure: UPPER EXTREMITY VENOGRAPHY CENTRAL VENOGRAM;  Surgeon: Waynetta Sandy, MD;  Location: [place] [facility];  Service: Cardiovascular;  Laterality: Right;   UPPER EXTREMITY VENOGRAPHY Bilateral 09/02/2018   Procedure: UPPER EXTREMITY VENOGRAPHY;  Surgeon: Waynetta Sandy, MD;  Location: [place] [facility];  Service: Cardiovascular;  Laterality: Bilateral;   Past Medical History:  Diagnosis Date   Anemia    Arthritis    knees   Breast cancer ([place]) 2019   Right Breast Cancer   Cancer [facility])    Chest pain Jan 2016   low risk Myoview    CHF (congestive heart failure) ([place])    Colitis 12/08/2014   [facility]- focal moderate active colitis   Complication of anesthesia 2005   difficulty remembering for a while and waking up   Constipation    Dysrhythmia    h/o A-Fib   Elevated LFTs 2016   [facility]   ESRD on dialysis [facility]) April 2016   MWF [facility]   Fundic gland polyps of stomach, benign    GERD (gastroesophageal reflux disease)    Heart murmur Nov 2015   Aortic scleosis- no stenosis   Hemodialysis patient ([place])    Hypertension    Shortness of breath dyspnea    with  exertion   BP 117/76   Pulse 73   Temp 98.6 F (37 C)   Ht 5\' 1"  (1.549 m)   Wt 182 lb (82.6 kg)   LMP  (LMP Unknown)   SpO2 99%   BMI 34.39 kg/m   Opioid Risk Score:   Fall Risk Score:  `1  Depression screen PHQ 2/9  Depression screen PHQ 2/9 09/09/2020  Decreased Interest 1  Down, Depressed, Hopeless 0  PHQ - 2 Score 1  Altered sleeping 1  Tired, decreased energy 1  Change in appetite 0  Feeling bad or failure about yourself  0  Trouble concentrating 1  Moving slowly or fidgety/restless 0  Suicidal thoughts 0  PHQ-9 Score 4  Some recent data might be hidden    Review of Systems  Gastrointestinal:  Positive for diarrhea.  Musculoskeletal:        Knee pain  All other systems reviewed and are negative.     Objective:   Physical Exam  Gen: no distress, normal appearing HEENT: oral mucosa pink and moist, NCAT Cardio: Reg rate Chest: normal effort, normal rate of breathing Abd: soft, non-distended Ext: no edema Psych: pleasant, normal affect Skin: intact Neuro:Alert and oriented x 3 with cues and extra time for processing. Could spell word "world" forwards and backwards.. Normal insight and awareness. Intact Memory. Normal language and speech. Cranial nerve exam unremarkable.  Strength is 5 out of 5.  Sensory exam is normal.  Romberg test is negative.  She ambulated without any issues for me today. Musculoskeletal: Full ROM, No pain with AROM or PROM in the neck, trunk, or extremities. Posture appropriate       Medical Problem List and Plan: 1.  Decreased functional mobility with gait disturbance secondary to traumatic left SDH with 4 mm left to right midline shift as well as small right subdural hematoma status post left parietal craniotomy evacuation of subdural hematoma 08/02/2020.                -  Continue with HEP  -For higher level cognitive deficits: Discussed brain games, activities she can do in home.  Even discussed trying to read articles to test her  comprehension and attention.  I do not think she needs anything for consultation from a medication standpoint at this time. 2.  I  Pain Management/post-traumatic headache: Hydrocodone as needed             -wean topamax to off.             -tylenol prn 3.   Atrial fibrillation/chest pain.  per cards   4.  GERD.  hold Protonix for now given loose stool 5.  End-stage renal disease/hyperkalemia.  Hemodialysis per renal service.    6.  Diastolic congestive heart failure.   Per pcp/cards 7. Loose stooL: related to protonix? I see no other obvious source  -dc protonix for now and observe   Fifteen minutes of face to face patient care time were spent during this visit. All questions were encouraged and answered.  Follow up with me in 4 mos .

## 2020-11-05 ENCOUNTER — Other Ambulatory Visit: Payer: Self-pay | Admitting: Physical Medicine & Rehabilitation

## 2020-11-17 ENCOUNTER — Other Ambulatory Visit: Payer: Self-pay | Admitting: Physical Medicine & Rehabilitation

## 2020-11-19 ENCOUNTER — Other Ambulatory Visit: Payer: Self-pay

## 2020-11-23 ENCOUNTER — Ambulatory Visit (HOSPITAL_COMMUNITY)
Admission: RE | Admit: 2020-11-23 | Discharge: 2020-11-23 | Disposition: A | Discharge status: Home / Self Care | Payer: Medicare Other | Attending: Surgery | Admitting: Surgery

## 2020-11-23 ENCOUNTER — Other Ambulatory Visit: Payer: Self-pay

## 2020-11-23 ENCOUNTER — Encounter (HOSPITAL_COMMUNITY): Payer: Self-pay | Admitting: Surgery

## 2020-11-23 ENCOUNTER — Encounter (HOSPITAL_COMMUNITY)
Admission: RE | Disposition: A | Discharge status: Home / Self Care | Payer: Self-pay | Source: Home / Self Care | Attending: Surgery

## 2020-11-23 DIAGNOSIS — N186 End stage renal disease: Secondary | ICD-10-CM

## 2020-11-23 DIAGNOSIS — Y841 Kidney dialysis as the cause of abnormal reaction of the patient, or of later complication, without mention of misadventure at the time of the procedure: Secondary | ICD-10-CM | POA: Diagnosis not present

## 2020-11-23 DIAGNOSIS — Z992 Dependence on renal dialysis: Secondary | ICD-10-CM

## 2020-11-23 DIAGNOSIS — T82858A Stenosis of vascular prosthetic devices, implants and grafts, initial encounter: Secondary | ICD-10-CM | POA: Insufficient documentation

## 2020-11-23 DIAGNOSIS — I871 Compression of vein: Secondary | ICD-10-CM

## 2020-11-23 DIAGNOSIS — Z95820 Peripheral vascular angioplasty status with implants and grafts: Secondary | ICD-10-CM

## 2020-11-23 HISTORY — PX: PERIPHERAL VASCULAR BALLOON ANGIOPLASTY: CATH118281

## 2020-11-23 HISTORY — PX: A/V FISTULAGRAM: CATH118298

## 2020-11-23 LAB — POCT I-STAT, CHEM 8
BUN: 39 mg/dL — ABNORMAL HIGH (ref 8–23)
Calcium, Ion: 1.07 mmol/L — ABNORMAL LOW (ref 1.15–1.40)
Chloride: 95 mmol/L — ABNORMAL LOW (ref 98–111)
Creatinine, Ser: 8 mg/dL — ABNORMAL HIGH (ref 0.44–1.00)
Glucose, Bld: 83 mg/dL (ref 70–99)
HCT: 38 % (ref 36.0–46.0)
Hemoglobin: 12.9 g/dL (ref 12.0–15.0)
Potassium: 5.5 mmol/L — ABNORMAL HIGH (ref 3.5–5.1)
Sodium: 137 mmol/L (ref 135–145)
TCO2: 33 mmol/L — ABNORMAL HIGH (ref 22–32)

## 2020-11-23 SURGERY — A/V FISTULAGRAM
Anesthesia: LOCAL | Laterality: Right

## 2020-11-23 MED ORDER — FENTANYL CITRATE (PF) 100 MCG/2ML IJ SOLN
INTRAMUSCULAR | Status: AC
  Filled 2020-11-23: qty 2

## 2020-11-23 MED ORDER — LIDOCAINE HCL (PF) 1 % IJ SOLN
INTRAMUSCULAR | Status: AC
  Filled 2020-11-23: qty 30

## 2020-11-23 MED ORDER — FENTANYL CITRATE (PF) 100 MCG/2ML IJ SOLN
INTRAMUSCULAR | Status: DC | PRN
  Administered 2020-11-23: 25 ug via INTRAVENOUS
  Administered 2020-11-23: 50 ug via INTRAVENOUS

## 2020-11-23 MED ORDER — SODIUM CHLORIDE 0.9% FLUSH: 3.0000 mL | Freq: Two times a day (BID) | INTRAVENOUS | Status: DC

## 2020-11-23 MED ORDER — MIDAZOLAM HCL 2 MG/2ML IJ SOLN
INTRAMUSCULAR | Status: AC
  Filled 2020-11-23: qty 2

## 2020-11-23 MED ORDER — HEPARIN (PORCINE) IN NACL 1000-0.9 UT/500ML-% IV SOLN
INTRAVENOUS | Status: AC
  Filled 2020-11-23: qty 500

## 2020-11-23 MED ORDER — SODIUM CHLORIDE 0.9 % IV SOLN: 250.0000 mL | INTRAVENOUS | Status: DC | PRN

## 2020-11-23 MED ORDER — LIDOCAINE HCL (PF) 1 % IJ SOLN
INTRAMUSCULAR | Status: DC | PRN
  Administered 2020-11-23: 2 mL

## 2020-11-23 MED ORDER — IODIXANOL 320 MG/ML IV SOLN
INTRAVENOUS | Status: DC | PRN
  Administered 2020-11-23: 30 mL

## 2020-11-23 MED ORDER — SODIUM CHLORIDE 0.9% FLUSH: 3.0000 mL | INTRAVENOUS | Status: DC | PRN

## 2020-11-23 MED ORDER — HEPARIN (PORCINE) IN NACL 1000-0.9 UT/500ML-% IV SOLN
INTRAVENOUS | Status: DC | PRN
  Administered 2020-11-23: 500 mL

## 2020-11-23 MED ORDER — MIDAZOLAM HCL 2 MG/2ML IJ SOLN
INTRAMUSCULAR | Status: DC | PRN
  Administered 2020-11-23: 1 mg via INTRAVENOUS

## 2020-11-23 SURGICAL SUPPLY — 14 items
BAG SNAP BAND KOVER 36X36 (MISCELLANEOUS) ×3 IMPLANT
BALLN LUTONIX AV 8X60X75 (BALLOONS) ×3
BALLOON LUTONIX AV 8X60X75 (BALLOONS) ×2 IMPLANT
COVER DOME SNAP 22 D (MISCELLANEOUS) ×3 IMPLANT
KIT ENCORE 26 ADVANTAGE (KITS) ×3 IMPLANT
KIT MICROPUNCTURE NIT STIFF (SHEATH) ×3 IMPLANT
PROTECTION STATION PRESSURIZED (MISCELLANEOUS) ×3
SHEATH PINNACLE R/O II 6F 4CM (SHEATH) ×3 IMPLANT
SHEATH PROBE COVER 6X72 (BAG) ×3 IMPLANT
STATION PROTECTION PRESSURIZED (MISCELLANEOUS) ×2 IMPLANT
STOPCOCK MORSE 400PSI 3WAY (MISCELLANEOUS) ×3 IMPLANT
TRAY PV CATH (CUSTOM PROCEDURE TRAY) ×3 IMPLANT
TUBING CIL FLEX 10 FLL-RA (TUBING) ×3 IMPLANT
WIRE BENTSON .035X145CM (WIRE) ×3 IMPLANT

## 2020-11-23 NOTE — H&P (Signed)
   Patient name: Eliya Bubar MRN: [id number] DOB: 1955-12-11 Sex: female  HISTORY OF PRESENT ILLNESS:   Britini Garcilazo is a 65 y.o. female with ESRD who is having trouble with dialysis access.  She is her for further evaluation.  CURRENT MEDICATIONS:    Current Facility-Administered Medications  Medication Dose Route Frequency Provider Last Rate Last Admin   0.9 %  sodium chloride infusion  250 mL Intravenous PRN Serafina Mitchell, MD       sodium chloride flush (NS) 0.9 % injection 3 mL  3 mL Intravenous Q12H Zarin Knupp, Butch Penny, MD       sodium chloride flush (NS) 0.9 % injection 3 mL  3 mL Intravenous PRN Serafina Mitchell, MD        REVIEW OF SYSTEMS:   [X]  denotes positive finding, [ ]  denotes negative finding Cardiac  Comments:  Chest pain or chest pressure:    Shortness of breath upon exertion:    Short of breath when lying flat:    Irregular heart rhythm:    Constitutional    Fever or chills:      PHYSICAL EXAM:   Vitals:   11/23/20 1034  BP: 124/72  Pulse: 74  Resp: 15  Temp: 97.7 F (36.5 C)  TempSrc: Oral  SpO2: 100%  Weight: 82.6 kg  Height: 5\' 1"  (1.549 m)    GENERAL: The patient is a well-nourished female, in no acute distress. The vital signs are documented above. CARDIOVASCULAR: There is a regular rate and rhythm. PULMONARY: Non-labored respirations   STUDIES:      MEDICAL ISSUES:   Plan for fistulogram with possible intervention/  Risks and benefits discussed  Leia Alf, MD, FACS [facility] [phone] Pager [phone]

## 2020-11-23 NOTE — Op Note (Signed)
    Patient name: Karen [name] MRN: [id number] DOB: March 27, 1955 Sex: female  11/23/2020 Pre-operative Diagnosis: End-stage renal disease Post-operative diagnosis:  Same Surgeon:  [name] Procedure Performed:  1.  Ultrasound-guided access, right arm fistula  2.  Fistulogram  3.  Balloon venoplasty, right basilic vein (peripheral)  4.  Conscious sedation, 23 minutes  Indications: The patient is undergone multiple percutaneous procedures to salvage her fistula.  She is having difficulty with flow rate and is back today for repeat imaging  Procedure:  The patient was identified in the holding area and taken to room 8.  The patient was then placed supine on the table and prepped and draped in the usual sterile fashion.  A time out was called.  Conscious sedation was administered with the use of IV fentanyl and Versed under continuous physician and nurse monitoring.  Heart rate, blood pressure, and oxygen saturations were continuously monitored.  Total sedation time was 23 minutes ultrasound was used to evaluate the fistula.  The vein was patent and compressible.  A digital ultrasound image was acquired.  The fistula was then accessed under ultrasound guidance using a micropuncture needle.  An 018 wire was then asvanced without resistance and a micropuncture sheath was placed.  Contrast injections were then performed through the sheath.  Findings: Central venous system is widely patent.  There is a stent within the basilic vein in the upper arm that is widely patent.  Just distal to the stent towards the elbow, there is a lesion creating a greater than 70% stenosis.  The vein is of relatively smaller caliber near the anastomosis, however there are no obvious lesions or stenoses.  The arterial venous anastomosis is widely patent   Intervention: After the above images were acquired the decision made to proceed with intervention.  A 6 French sheath was inserted.  I then advanced a Bentson wire  across the lesion and perform drug-coated balloon venoplasty using an 8 x 60 Lutonix balloon taking the balloon to burst pressure.  The patient had a fair amount of discomfort with this.  I could not get the waist to resolve.  The balloon was kept on for 2 minutes.  This was then deflated and the balloon was inflated within the previously placed stent.  Completion imaging showed significantly improved result with residual stenosis probably around 20%.  The sheath was removed and the cannulation site closed with a Monocryl  Impression:  #1  Recurrent stenosis within the basilic vein treated using an 8 x 60 drug-coatedLutonix balloon.  Patient did require a fair amount of sedation but tolerated the procedure well   V. [name], M.D., [facility] [place] Office: [phone] Pager:  [phone]

## 2020-11-24 ENCOUNTER — Other Ambulatory Visit: Payer: Self-pay | Admitting: Obstetrics and Gynecology

## 2020-11-24 ENCOUNTER — Ambulatory Visit: Payer: Medicare Other | Admitting: Physical Medicine & Rehabilitation

## 2020-11-24 DIAGNOSIS — M8580 Other specified disorders of bone density and structure, unspecified site: Secondary | ICD-10-CM

## 2021-01-03 NOTE — Progress Notes (Signed)
[facility] Note  01/04/2021     CHIEF COMPLAINT Patient presents for Retina Evaluation  HISTORY OF PRESENT ILLNESS: Karen [name] is a 65 y.o. female who presents to the clinic today for:     HPI     Retina Evaluation   In left eye.  Associated Symptoms Flashes.  I, the attending physician,  performed the HPI with the patient and updated documentation appropriately.        Comments   Patient here for Retina Evaluation. Referred by Dr Frederico Hamman. Patient states vision doing ok. Vision still blurred. Dr Frederico Hamman saw something in OS. Was in hospital from fall. Had a hematoma that required surgery. Saw a bright light in OS lower part of vision go across nasally to temporally. Patient is on dialysis.      Last edited by Bernarda Caffey, MD on 01/04/2021  9:58 PM.    Pt saw Dr. Frederico Hamman last Thursday for a routine eye exam, Dr. Frederico Hamman thought he saw a hole in her left eye, pt states she fell in June and hit the back of her head on her piano, she states about 2-3 weeks later, she started having bad headaches, she had a hematoma and had to have emergency sx in July, she states when she woke up she saw a bright light under her left eye, she states it has improved in frequency since then, but will occasionally still see it, pt feel like VA at a distance is blurry, she did get a new glasses rx from Dr. Frederico Hamman, but has not gotten it filled yet  Referring physician: Gevena Cotton, MD [address],  [place] [address]  HISTORICAL INFORMATION:   Selected notes from the MEDICAL RECORD NUMBER Referred by Dr. Gevena Cotton for eval of old retinal hole OS   CURRENT MEDICATIONS: No current outpatient medications on file. (Ophthalmic Drugs)   No current facility-administered medications for this visit. (Ophthalmic Drugs)   Current Outpatient Medications (Other)  Medication Sig   amoxicillin (AMOXIL) 500 MG tablet Take 2,000 mg by  mouth See admin instructions. Take 2000 mg 1 hour prior to dental work   atorvastatin (LIPITOR) 10 MG tablet Take 1 tablet (10 mg total) by mouth every evening.   Cinacalcet HCl (SENSIPAR PO) Take 1 tablet by mouth every Monday, Wednesday, and Friday.   clobetasol cream (TEMOVATE) 3.24 % Apply 1 application topically daily as needed (for rash).    diltiazem (CARDIZEM CD) 120 MG 24 hr capsule TAKE 1 CAPSULE (120 MG TOTAL) BY MOUTH EVERY TUESDAY, THURSDAY, SATURDAY, AND SUNDAY AT 6 PM.   diltiazem (CARDIZEM) 30 MG tablet Take 1 tablet (30 mg total) by mouth every Monday, Wednesday, and Friday.   Doxercalciferol (HECTOROL IV) Doxercalciferol (Hectorol)   ethyl chloride spray Apply 1 application topically every Monday, Wednesday, and Friday with hemodialysis.   fluticasone (FLONASE) 50 MCG/ACT nasal spray Place 1 spray into both nostrils daily as needed for allergies.   lidocaine-prilocaine (EMLA) cream Apply 1 application topically See admin instructions. Apply topically Monday, Wednesday and Friday before dialysis   loperamide (IMODIUM A-D) 2 MG tablet Take 2 mg by mouth 4 (four) times daily as needed for diarrhea or loose stools.   Methoxy PEG-Epoetin Beta (MIRCERA IJ) Mircera   midodrine (PROAMATINE) 10 MG tablet Take 2 tablets (20 mg total) by mouth every Monday, Wednesday, and Friday. Prior to dialysis   multivitamin (RENA-VIT) TABS tablet Take 1 tablet by mouth daily.  ondansetron (ZOFRAN) 8 MG tablet Take 1 tablet (8 mg total) by mouth every 8 (eight) hours as needed for nausea or vomiting.   sevelamer carbonate (RENVELA) 800 MG tablet Take 1 tablet (800 mg total) by mouth 3 (three) times daily with meals. (Patient taking differently: Take 1,600-2,400 mg by mouth See admin instructions. Take 2400 mg with each meal and 1600 mg with each snack)   prochlorperazine (COMPAZINE) 10 MG tablet Take 10 mg by mouth every 6 (six) hours as needed (Nausea or vomiting).   No current facility-administered  medications for this visit. (Other)   REVIEW OF SYSTEMS: ROS   Positive for: Eyes Negative for: Constitutional, Gastrointestinal, Neurological, Skin, Genitourinary, Musculoskeletal, HENT, Endocrine, Cardiovascular, Respiratory, Psychiatric, Allergic/Imm, Heme/Lymph Last edited by Matthew Folks, COA on 01/04/2021  9:31 AM.     ALLERGIES No Known Allergies  PAST MEDICAL HISTORY Past Medical History:  Diagnosis Date   Anemia    Arthritis    knees   Breast cancer ([place]) 2019   Right Breast Cancer   Cancer [facility])    Chest pain Jan 2016   low risk Myoview    CHF (congestive heart failure) ([place])    Colitis 12/08/2014   [facility]- focal moderate active colitis   Complication of anesthesia 2005   difficulty remembering for a while and waking up   Constipation    Dysrhythmia    h/o A-Fib   Elevated LFTs 2016   [facility]   ESRD on dialysis [facility]) April 2016   [facility] [place] [facility]   Fundic gland polyps of stomach, benign    GERD (gastroesophageal reflux disease)    Heart murmur Nov 2015   Aortic scleosis- no stenosis   Hemodialysis patient [facility])    Hypertension    Shortness of breath dyspnea    with exertion   Past Surgical History:  Procedure Laterality Date   A/V FISTULAGRAM Right 04/22/2019   Procedure: A/V FISTULAGRAM - Right Upper;  Surgeon: Serafina Mitchell, MD;  Location: [employer] CV LAB;  Service: Cardiovascular;  Laterality: Right;   A/V FISTULAGRAM N/A 02/17/2020   Procedure: A/V FISTULAGRAM - Right Upper;  Surgeon: Serafina Mitchell, MD;  Location: [place] CV LAB;  Service: Cardiovascular;  Laterality: N/A;   A/V FISTULAGRAM Right 11/23/2020   Procedure: A/V FISTULAGRAM;  Surgeon: Serafina Mitchell, MD;  Location: [place] CV LAB;  Service: Cardiovascular;  Laterality: Right;   ABDOMINAL HYSTERECTOMY  2005   AV FISTULA PLACEMENT Left 12/09/2013   Procedure: INSERTION OF ARTERIOVENOUS (AV) GORE-TEX GRAFT ARM;  Surgeon: Elam Dutch, MD;  Location: [place];   Service: Vascular;  Laterality: Left;   AV FISTULA PLACEMENT Right 01/01/2018   Procedure: INSERTION OF ARTERIOVENOUS (AV) GORE-TEX GRAFT ARM RIGHT ARM;  Surgeon: Elam Dutch, MD;  Location: [place];  Service: Vascular;  Laterality: Right;   AV FISTULA PLACEMENT Right 09/17/2018   Procedure: 1ST STAGE BASILIC ARTERIOVENOUS (AV) FISTULA CREATION RIGHT ARM;  Surgeon: Waynetta Sandy, MD;  Location: [place];  Service: Vascular;  Laterality: Right;   [place] Right 11/12/2018   Procedure: SECOND STAGE BASILIC VEIN TRANSPOSITION RIGHT ARM;  Surgeon: Waynetta Sandy, MD;  Location: [place];  Service: Vascular;  Laterality: Right;   BREAST BIOPSY  1990's   BUNIONECTOMY Bilateral    CHOLECYSTECTOMY  2005   COLONOSCOPY     CRANIOTOMY Left 08/02/2020   Procedure: CRANIOTOMY HEMATOMA EVACUATION SUBDURAL;  Surgeon: Eustace Moore, MD;  Location:  [place] OR;  Service: Neurosurgery;  Laterality: Left;   FISTULOGRAM Right 06/12/2019   Procedure: Fistulogram of right upper arm arteriovenous fistula;  Surgeon: Serafina Mitchell, MD;  Location: [facility] OR;  Service: Vascular;  Laterality: Right;   INSERTION OF ILIAC STENT  06/12/2019   Procedure: Insertion Of right basilic vein Stent;  Surgeon: Serafina Mitchell, MD;  Location: [place];  Service: Vascular;;   IR AV DIALY SHUNT INTRO NEEDLE/INTRACATH INITIAL W/PTA/IMG RIGHT Right 03/04/2019   IR GENERIC HISTORICAL  01/11/2016   IR US GUIDE VASC ACCESS RIGHT 01/11/2016 Corrie Mckusick, DO [facility]-INTER[facility]   IR GENERIC HISTORICAL  01/11/2016   IR RADIOLOGY PERIPHERAL GUIDED IV START 01/11/2016 Corrie Mckusick, DO [facility]   IR IMAGING GUIDED PORT INSERTION  07/05/2017   IR REMOVAL TUN CV CATH W/O FL  08/12/2019   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   KNEE ARTHROSCOPY Bilateral    MASTECTOMY W/ SENTINEL NODE BIOPSY Right 11/20/2017   MASTECTOMY W/ SENTINEL NODE BIOPSY Right 11/20/2017   Procedure: RIGHT MASTECTOMY WITH SENTINEL LYMPH NODE  BIOPSY;  Surgeon: Coralie Keens, MD;  Location: [place];  Service: General;  Laterality: Right;   PERIPHERAL VASCULAR BALLOON ANGIOPLASTY  02/17/2020   Procedure: PERIPHERAL VASCULAR BALLOON ANGIOPLASTY;  Surgeon: Serafina Mitchell, MD;  Location: [place] CV LAB;  Service: Cardiovascular;;   PERIPHERAL VASCULAR BALLOON ANGIOPLASTY  11/23/2020   Procedure: PERIPHERAL VASCULAR BALLOON ANGIOPLASTY;  Surgeon: Serafina Mitchell, MD;  Location: [place] CV LAB;  Service: Cardiovascular;;   PERIPHERAL VASCULAR CATHETERIZATION N/A 08/31/2015   Procedure: A/V Shuntogram;  Surgeon: Serafina Mitchell, MD;  Location: [place] CV LAB;  Service: Cardiovascular;  Laterality: N/A;   PERIPHERAL VASCULAR CATHETERIZATION Left 08/31/2015   Procedure: Peripheral Vascular Balloon Angioplasty;  Surgeon: Serafina Mitchell, MD;  Location: [place] CV LAB;  Service: Cardiovascular;  Laterality: Left;  arm fistula   PERIPHERAL VASCULAR INTERVENTION Right 04/22/2019   Procedure: PERIPHERAL VASCULAR INTERVENTION;  Surgeon: Serafina Mitchell, MD;  Location: [place] CV LAB;  Service: Cardiovascular;  Laterality: Right;  FISTULA   PORT-A-CATH REMOVAL N/A 08/12/2018   Procedure: PORT-A-CATH REMOVAL;  Surgeon: Coralie Keens, MD;  Location: [place];  Service: General;  Laterality: N/A;   [place] Right 12/24/2017   Procedure: UPPER EXTREMITY VENOGRAPHY CENTRAL VENOGRAM;  Surgeon: Waynetta Sandy, MD;  Location: [place] CV LAB;  Service: Cardiovascular;  Laterality: Right;   UPPER EXTREMITY VENOGRAPHY Bilateral 09/02/2018   Procedure: UPPER EXTREMITY VENOGRAPHY;  Surgeon: Waynetta Sandy, MD;  Location: [place] CV LAB;  Service: Cardiovascular;  Laterality: Bilateral;    FAMILY HISTORY Family History  Problem Relation Age of Onset   Kidney disease Mother    Heart attack Father    Kidney disease Father    Diabetes Sister    Hyperlipidemia  Sister    Hypertension Sister    Kidney disease Sister        x2    SOCIAL HISTORY Social History   Tobacco Use   Smoking status: Never   Smokeless tobacco: Never  Vaping Use   Vaping Use: Never used  Substance Use Topics   Alcohol use: No   Drug use: No       OPHTHALMIC EXAM:  Base Eye Exam     Visual Acuity (Snellen - Linear)       Right Left   Dist cc 20/20 -1 20/20  Correction: Glasses         Tonometry (Tonopen, 9:20 AM)       Right Left   Pressure 09 08         Pupils       Dark Light Shape React APD   Right 3 2 Round Brisk None   Left 3 2 Round Brisk None         Visual Fields (Counting fingers)       Left Right    Full Full         Extraocular Movement       Right Left    Full, Ortho Full, Ortho         Neuro/Psych     Oriented x3: Yes   Mood/Affect: Normal         Dilation     Both eyes: 1.0% Mydriacyl, 2.5% Phenylephrine @ 9:19 AM           Slit Lamp and Fundus Exam     Slit Lamp Exam       Right Left   Lids/Lashes Dermatochalasis - upper lid Dermatochalasis - upper lid   Conjunctiva/Sclera mild melanosis mild melanosis   Cornea arcus, trace PEE, small sub epi scar IT paracentral arcus, trace PEE, small sub epi scar IT paracentral   Anterior Chamber Deep and quiet Deep and quiet   Iris Round and dilated Round and dilated   Lens 2+ Nuclear sclerosis, 2-3+ Cortical cataract 2+ Nuclear sclerosis, 2-3+ Cortical cataract   Anterior Vitreous Vitreous syneresis, Posterior vitreous detachment, Weiss ring, vitreous condensations Vitreous syneresis         Fundus Exam       Right Left   Disc Nasal elevation, Pink and Sharp Mild Nasal elevation, Pink and Sharp   C/D Ratio 0.2 0.2   Macula Flat, Good foveal reflex, mild RPE mottling, No heme or edema Flat, Good foveal reflex, mild RPE mottling, No heme or edema   Vessels attenuated, Tortuous, mild AV crossing changes attenuated, Tortuous, mild AV crossing  changes   Periphery Attached, no heme, scattered peripheral drusen Attached, patch of pigmented CR atrophy spanning 0300-0430           Refraction     Wearing Rx       Sphere Cylinder Axis Add   Right -3.00 +0.75 002 +1.75   Left -2.50 +1.00 012 +1.75         Manifest Refraction       Sphere Cylinder Axis Dist VA   Right -3.00 +0.75 002 20/20   Left -2.50 +1.00 012 20/20            IMAGING AND PROCEDURES  Imaging and Procedures for 01/04/2021  OCT, Retina - OU - Both Eyes       Right Eye Quality was good. Central Foveal Thickness: 244. Progression has no prior data. Findings include normal foveal contour, no IRF, no SRF, retinal drusen (Rare drusen).   Left Eye Quality was good. Central Foveal Thickness: 250. Progression has no prior data. Findings include normal foveal contour, no IRF, no SRF, vitreomacular adhesion , retinal drusen (Rare drusen).   Notes *Images captured and stored on drive  Diagnosis / Impression:  NFP, no IRF/SRF, rare drusen OU  Clinical management:  See below  Abbreviations: NFP - Normal foveal profile. CME - cystoid macular edema. PED - pigment epithelial detachment. IRF - intraretinal fluid. SRF - subretinal fluid. EZ - ellipsoid zone. ERM - epiretinal membrane. ORA - outer  retinal atrophy. ORT - outer retinal tubulation. SRHM - subretinal hyper-reflective material. 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 Essential hypertension  I10     3. Hypertensive retinopathy of both eyes  H35.033 OCT, Retina - OU - Both Eyes    4. Combined forms of age-related cataract of both eyes  H25.813      Chorioretinal scar OS   - patch of pigmented CR atrophy spanning 0300-0430  - no retinal hole, tear or detachment - no retinal or ophthalmic interventions indicated or recommended  - pt is cleared from a retina standpoint for release and  resumption of primary eye care   - pt can f/u here PRN  2,3. Hypertensive retinopathy OU - discussed importance of tight BP control - monitor  4. Mixed Cataract OU - The symptoms of cataract, surgical options, and treatments and risks were discussed with patient. - discussed diagnosis and progression - not yet visually significant - monitor for now   Ophthalmic Meds Ordered this visit:  No orders of the defined types were placed in this encounter.     Return if symptoms worsen or fail to improve.  There are no Patient Instructions on file for this visit.  Explained the diagnoses, plan, and follow up with the patient and they expressed understanding.  Patient expressed understanding of the importance of proper follow up care.   This document serves as a record of services personally performed by Gardiner Sleeper, MD, PhD. It was created on their behalf by Estill Bakes, COT an ophthalmic technician. The creation of this record is the provider's dictation and/or activities during the visit.    Electronically signed by: Estill Bakes, COT 12.19.22 @ 9:58 PM   This document serves as a record of services personally performed by Gardiner Sleeper, MD, PhD. It was created on their behalf by San Jetty. Owens Shark, OA an ophthalmic technician. The creation of this record is the provider's dictation and/or activities during the visit.    Electronically signed by: San Jetty. Owens Shark, [place] 12.20.2022 9:58 PM  Gardiner Sleeper, M.D., Ph.D. Diseases & Surgery of the Retina and Vitreous [facility]  I have reviewed the above documentation for accuracy and completeness, and I agree with the above. Gardiner Sleeper, M.D., Ph.D. 01/04/21 10:01 PM   Abbreviations: M myopia (nearsighted); A astigmatism; H hyperopia (farsighted); P presbyopia; Mrx spectacle prescription;  CTL contact lenses; OD right eye; OS left eye; OU both eyes  XT exotropia; ET esotropia; PEK punctate epithelial  keratitis; PEE punctate epithelial erosions; DES dry eye syndrome; MGD meibomian gland dysfunction; ATs artificial tears; PFAT's preservative free artificial tears; [place] nuclear sclerotic cataract; PSC posterior subcapsular cataract; ERM epi-retinal membrane; PVD posterior vitreous detachment; RD retinal detachment; DM diabetes mellitus; DR diabetic retinopathy; NPDR non-proliferative diabetic retinopathy; PDR proliferative diabetic retinopathy; CSME clinically significant macular edema; DME diabetic macular edema; dbh dot blot hemorrhages; CWS cotton wool spot; POAG primary open angle glaucoma; C/D cup-to-disc ratio; HVF humphrey visual field; GVF goldmann visual field; OCT optical coherence tomography; IOP intraocular pressure; BRVO Branch retinal vein occlusion; CRVO central retinal vein occlusion; CRAO central retinal artery occlusion; BRAO branch retinal artery occlusion; RT retinal tear; SB scleral buckle; PPV pars plana vitrectomy; VH Vitreous hemorrhage; PRP panretinal laser photocoagulation; IVK intravitreal kenalog; VMT vitreomacular traction; MH  Macular hole;  NVD neovascularization of the disc; NVE neovascularization elsewhere; AREDS age related eye disease study; ARMD age related macular degeneration; POAG primary open angle glaucoma; EBMD epithelial/anterior basement membrane dystrophy; ACIOL anterior chamber intraocular lens; IOL intraocular lens; PCIOL posterior chamber intraocular lens; Phaco/IOL phacoemulsification with intraocular lens placement; [place] photorefractive keratectomy; LASIK laser assisted in situ keratomileusis; HTN hypertension; DM diabetes mellitus; COPD chronic obstructive pulmonary disease

## 2021-01-04 ENCOUNTER — Other Ambulatory Visit: Payer: Self-pay

## 2021-01-04 ENCOUNTER — Ambulatory Visit (INDEPENDENT_AMBULATORY_CARE_PROVIDER_SITE_OTHER): Payer: Medicare Other | Admitting: Ophthalmology

## 2021-01-04 ENCOUNTER — Encounter (INDEPENDENT_AMBULATORY_CARE_PROVIDER_SITE_OTHER): Payer: Self-pay | Admitting: Ophthalmology

## 2021-01-04 DIAGNOSIS — H25813 Combined forms of age-related cataract, bilateral: Secondary | ICD-10-CM

## 2021-01-04 DIAGNOSIS — H31002 Unspecified chorioretinal scars, left eye: Secondary | ICD-10-CM

## 2021-01-04 DIAGNOSIS — H35033 Hypertensive retinopathy, bilateral: Secondary | ICD-10-CM | POA: Diagnosis not present

## 2021-01-04 DIAGNOSIS — I10 Essential (primary) hypertension: Secondary | ICD-10-CM

## 2021-01-05 ENCOUNTER — Encounter (INDEPENDENT_AMBULATORY_CARE_PROVIDER_SITE_OTHER): Payer: Medicare Other | Admitting: Ophthalmology

## 2021-01-25 ENCOUNTER — Telehealth: Payer: Self-pay

## 2021-01-25 NOTE — Telephone Encounter (Signed)
Pt called requesting an appt for pain and swelling of an old L AVG that has not been used in a several years, per pt. I also spoke with kidney center who is going to fax over documentation regarding this new onset of pain/swelling. MD appt has been made without studies, per APP. Pt is aware of upcoming appt.

## 2021-01-26 NOTE — Assessment & Plan Note (Deleted)
06/19/2017: Right breast skin thickening and heaviness: Screening mammogram detected 2 cm of microcalcifications in the right breast in the posterior third UOQ.? 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 0/1 lymph node negative ER: 0%, negative, PR: 0%, negative, Her2: Positive, ratio 8.69. Ki-67: 30% Herceptin maintenance completed 08/06/2018 Hospitalization: 01/06/2018 - 01/08/2018: Pericarditis unrelated to treatment: ---------------------------------------------------------------------------------------------------------------------------------------------- Breast cancer surveillance: 1.left breastmammogram 7/7//2022: Benign breast density category C 2.Breast exam 01/27/2020: Benign, right chest wall scar is without any lumps or nodules, left breast: No lumps or nodules. No role of antiestrogen therapy because she is ER/PR negative.  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 Anemia due to chronic kidney disease  Return to clinic in 1 year for follow-up

## 2021-01-27 ENCOUNTER — Inpatient Hospital Stay: Payer: Medicare Other | Admitting: Hematology and Oncology

## 2021-01-27 ENCOUNTER — Telehealth: Payer: Self-pay | Admitting: Hematology and Oncology

## 2021-01-27 DIAGNOSIS — Z171 Estrogen receptor negative status [ER-]: Secondary | ICD-10-CM

## 2021-01-27 NOTE — Telephone Encounter (Signed)
Sch per 1/12 inbasket, unable to leave msg, mailed calendar

## 2021-01-31 ENCOUNTER — Telehealth: Payer: Self-pay | Admitting: *Deleted

## 2021-01-31 NOTE — Telephone Encounter (Signed)
Confirmed appt with Dr. Lindi Adie on 02/17/21 at 10:15. No further needs voiced at this time

## 2021-02-07 ENCOUNTER — Other Ambulatory Visit: Payer: Self-pay

## 2021-02-07 ENCOUNTER — Encounter: Payer: Self-pay | Admitting: Surgery

## 2021-02-07 ENCOUNTER — Ambulatory Visit (INDEPENDENT_AMBULATORY_CARE_PROVIDER_SITE_OTHER): Payer: Medicare Other | Admitting: Surgery

## 2021-02-07 VITALS — BP 131/81 | HR 82 | Temp 98.1°F | Resp 20 | Ht 61.0 in | Wt 185.0 lb

## 2021-02-07 DIAGNOSIS — Z992 Dependence on renal dialysis: Secondary | ICD-10-CM

## 2021-02-07 DIAGNOSIS — N186 End stage renal disease: Secondary | ICD-10-CM

## 2021-02-07 NOTE — Progress Notes (Signed)
Vascular and Vein Specialist of [place]  Patient name: Karen [name] MRN: [id number] DOB: October 06, 1955 Sex: female   REASON FOR VISIT:    Follow up swelling  HISOTRY OF PRESENT ILLNESS:    Karen [name] is a 66 y.o. female who is status post right basilic vein fistula on 6/0/6301.  Prior to this, she had a failed right arm as well as left arm graft.  She presented on 04/22/2019 with decreased flow rates and underwent fistulogram.  She was found to have a stenotic area in the midportion of her fistula which was treated with drug-coated balloon venoplasty.  She continued to have difficulty and underwent fistulogram on 06/12/2019.  She was found to have recurrent stenosis which was treated using an 8 x 60 INNOVA stent.  On 02/17/2020 she returned for recurrent symptoms.  She was treated with drug-coated balloon venoplasty using 8 mm balloon however she did not tolerate this very well secondary to pain.  She again required treatment on 11/23/2020.  Approximately 2 weeks ago, she began having pain and some redness over top of her left upper arm graft which is nonfunctional.  She was recently started on antibiotics for a sinus infection.  PAST MEDICAL HISTORY:   Past Medical History:  Diagnosis Date   Anemia    Arthritis    knees   Breast cancer ([place]) 2019   Right Breast Cancer   Cancer [facility])    Chest pain Jan 2016   low risk Myoview    CHF (congestive heart failure) ([place])    Colitis 12/08/2014   [facility]- focal moderate active colitis   Complication of anesthesia 2005   difficulty remembering for a while and waking up   Constipation    Dysrhythmia    h/o A-Fib   Elevated LFTs 2016   [facility]   ESRD on dialysis [facility]) April 2016   MWF [place]   Fundic gland polyps of stomach, benign    GERD (gastroesophageal reflux disease)    Heart murmur Nov 2015   Aortic scleosis- no stenosis   Hemodialysis patient ([place])     Hypertension    Shortness of breath dyspnea    with exertion     FAMILY HISTORY:   Family History  Problem Relation Age of Onset   Kidney disease Mother    Heart attack Father    Kidney disease Father    Diabetes Sister    Hyperlipidemia Sister    Hypertension Sister    Kidney disease Sister        x2    SOCIAL HISTORY:   Social History   Tobacco Use   Smoking status: Never   Smokeless tobacco: Never  Substance Use Topics   Alcohol use: No     ALLERGIES:   No Known Allergies   CURRENT MEDICATIONS:   Current Outpatient Medications  Medication Sig Dispense Refill   amoxicillin (AMOXIL) 500 MG tablet Take 2,000 mg by mouth See admin instructions. Take 2000 mg 1 hour prior to dental work     atorvastatin (LIPITOR) 10 MG tablet Take 1 tablet (10 mg total) by mouth every evening. 90 tablet 1   Cinacalcet HCl (SENSIPAR PO) Take 1 tablet by mouth every Monday, Wednesday, and Friday.     clobetasol cream (TEMOVATE) 6.01 % Apply 1 application topically daily as needed (for rash).      diltiazem (CARDIZEM CD) 120 MG 24 hr capsule TAKE 1 CAPSULE (120 MG TOTAL) BY MOUTH EVERY TUESDAY, THURSDAY,  SATURDAY, AND SUNDAY AT 6 PM. 30 capsule 11   diltiazem (CARDIZEM) 30 MG tablet Take 1 tablet (30 mg total) by mouth every Monday, Wednesday, and Friday. 135 tablet 1   Doxercalciferol (HECTOROL IV) Doxercalciferol (Hectorol)     ethyl chloride spray Apply 1 application topically every Monday, Wednesday, and Friday with hemodialysis.     fluticasone (FLONASE) 50 MCG/ACT nasal spray Place 1 spray into both nostrils daily as needed for allergies.     lidocaine-prilocaine (EMLA) cream Apply 1 application topically See admin instructions. Apply topically Monday, Wednesday and Friday before dialysis  6   loperamide (IMODIUM A-D) 2 MG tablet Take 2 mg by mouth 4 (four) times daily as needed for diarrhea or loose stools.     Methoxy PEG-Epoetin Beta (MIRCERA IJ) Mircera     midodrine  (PROAMATINE) 10 MG tablet Take 2 tablets (20 mg total) by mouth every Monday, Wednesday, and Friday. Prior to dialysis 28 tablet 0   multivitamin (RENA-VIT) TABS tablet Take 1 tablet by mouth daily.      ondansetron (ZOFRAN) 8 MG tablet Take 1 tablet (8 mg total) by mouth every 8 (eight) hours as needed for nausea or vomiting. 20 tablet 0   sevelamer carbonate (RENVELA) 800 MG tablet Take 1 tablet (800 mg total) by mouth 3 (three) times daily with meals. (Patient taking differently: Take 1,600-2,400 mg by mouth See admin instructions. Take 2400 mg with each meal and 1600 mg with each snack)     prochlorperazine (COMPAZINE) 10 MG tablet Take 10 mg by mouth every 6 (six) hours as needed (Nausea or vomiting).     No current facility-administered medications for this visit.    REVIEW OF SYSTEMS:   [X]  denotes positive finding, [ ]  denotes negative finding Cardiac  Comments:  Chest pain or chest pressure:    Shortness of breath upon exertion:    Short of breath when lying flat:    Irregular heart rhythm:        Vascular    Pain in calf, thigh, or hip brought on by ambulation:    Pain in feet at night that wakes you up from your sleep:     Blood clot in your veins:    Leg swelling:         Pulmonary    Oxygen at home:    Productive cough:     Wheezing:         Neurologic    Sudden weakness in arms or legs:     Sudden numbness in arms or legs:     Sudden onset of difficulty speaking or slurred speech:    Temporary loss of vision in one eye:     Problems with dizziness:         Gastrointestinal    Blood in stool:     Vomited blood:         Genitourinary    Burning when urinating:     Blood in urine:        Psychiatric    Major depression:         Hematologic    Bleeding problems:    Problems with blood clotting too easily:        Skin    Rashes or ulcers:        Constitutional    Fever or chills:      PHYSICAL EXAM:   Vitals:   02/07/21 0913  BP: 131/81  Pulse:  82  Resp: 20  Temp: 98.1 F (36.7 C)  SpO2: 95%  Weight: 185 lb (83.9 kg)  Height: 5\' 1"  (1.549 m)    GENERAL: The patient is a well-nourished female, in no acute distress. The vital signs are documented above. CARDIAC: There is a regular rate and rhythm.  VASCULAR: Tenderness at the arterial anastomosis of the left upper arm graft.  I evaluated this with SonoSite.  There is a small rim of fluid around the graft.  The area is very tender to the touch. PULMONARY: Non-labored respirations MUSCULOSKELETAL: There are no major deformities or cyanosis. NEUROLOGIC: No focal weakness or paresthesias are detected. SKIN: There are no ulcers or rashes noted. PSYCHIATRIC: The patient has a normal affect.  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  This will likely require patch angioplasty of the brachial artery.  I will plan on removing what ever graft is infected.  This will likely not require removal of the entire graft.  She will remain on her antibiotics for her sinus infection.  Her procedure is been scheduled for Thursday, January 26.    Leia Alf, MD, FACS Vascular and Vein Specialists of [facility] [phone] Pager [phone]

## 2021-02-07 NOTE — H&P (View-Only) (Signed)
Vascular and Vein Specialist of [place]  Patient name: Karen [name] MRN: [id number] DOB: 07-19-1955 Sex: female   REASON FOR VISIT:    Follow up swelling  HISOTRY OF PRESENT ILLNESS:    Karen [name] is a 66 y.o. female who is status post right basilic vein fistula on 07/24/3901.  Prior to this, she had a failed right arm as well as left arm graft.  She presented on 04/22/2019 with decreased flow rates and underwent fistulogram.  She was found to have a stenotic area in the midportion of her fistula which was treated with drug-coated balloon venoplasty.  She continued to have difficulty and underwent fistulogram on 06/12/2019.  She was found to have recurrent stenosis which was treated using an 8 x 60 INNOVA stent.  On 02/17/2020 she returned for recurrent symptoms.  She was treated with drug-coated balloon venoplasty using 8 mm balloon however she did not tolerate this very well secondary to pain.  She again required treatment on 11/23/2020.  Approximately 2 weeks ago, she began having pain and some redness over top of her left upper arm graft which is nonfunctional.  She was recently started on antibiotics for a sinus infection.  PAST MEDICAL HISTORY:   Past Medical History:  Diagnosis Date   Anemia    Arthritis    knees   Breast cancer ([place]) 2019   Right Breast Cancer   Cancer [facility])    Chest pain Jan 2016   low risk Myoview    CHF (congestive heart failure) ([place])    Colitis 12/08/2014   [facility]- focal moderate active colitis   Complication of anesthesia 2005   difficulty remembering for a while and waking up   Constipation    Dysrhythmia    h/o A-Fib   Elevated LFTs 2016   [facility]   ESRD on dialysis [facility]) April 2016   MWF [place]   Fundic gland polyps of stomach, benign    GERD (gastroesophageal reflux disease)    Heart murmur Nov 2015   Aortic scleosis- no stenosis   Hemodialysis patient ([place])     Hypertension    Shortness of breath dyspnea    with exertion     FAMILY HISTORY:   Family History  Problem Relation Age of Onset   Kidney disease Mother    Heart attack Father    Kidney disease Father    Diabetes Sister    Hyperlipidemia Sister    Hypertension Sister    Kidney disease Sister        x2    SOCIAL HISTORY:   Social History   Tobacco Use   Smoking status: Never   Smokeless tobacco: Never  Substance Use Topics   Alcohol use: No     ALLERGIES:   No Known Allergies   CURRENT MEDICATIONS:   Current Outpatient Medications  Medication Sig Dispense Refill   amoxicillin (AMOXIL) 500 MG tablet Take 2,000 mg by mouth See admin instructions. Take 2000 mg 1 hour prior to dental work     atorvastatin (LIPITOR) 10 MG tablet Take 1 tablet (10 mg total) by mouth every evening. 90 tablet 1   Cinacalcet HCl (SENSIPAR PO) Take 1 tablet by mouth every Monday, Wednesday, and Friday.     clobetasol cream (TEMOVATE) 0.09 % Apply 1 application topically daily as needed (for rash).      diltiazem (CARDIZEM CD) 120 MG 24 hr capsule TAKE 1 CAPSULE (120 MG TOTAL) BY MOUTH EVERY TUESDAY, THURSDAY,  SATURDAY, AND SUNDAY AT 6 PM. 30 capsule 11   diltiazem (CARDIZEM) 30 MG tablet Take 1 tablet (30 mg total) by mouth every Monday, Wednesday, and Friday. 135 tablet 1   Doxercalciferol (HECTOROL IV) Doxercalciferol (Hectorol)     ethyl chloride spray Apply 1 application topically every Monday, Wednesday, and Friday with hemodialysis.     fluticasone (FLONASE) 50 MCG/ACT nasal spray Place 1 spray into both nostrils daily as needed for allergies.     lidocaine-prilocaine (EMLA) cream Apply 1 application topically See admin instructions. Apply topically Monday, Wednesday and Friday before dialysis  6   loperamide (IMODIUM A-D) 2 MG tablet Take 2 mg by mouth 4 (four) times daily as needed for diarrhea or loose stools.     Methoxy PEG-Epoetin Beta (MIRCERA IJ) Mircera     midodrine  (PROAMATINE) 10 MG tablet Take 2 tablets (20 mg total) by mouth every Monday, Wednesday, and Friday. Prior to dialysis 28 tablet 0   multivitamin (RENA-VIT) TABS tablet Take 1 tablet by mouth daily.      ondansetron (ZOFRAN) 8 MG tablet Take 1 tablet (8 mg total) by mouth every 8 (eight) hours as needed for nausea or vomiting. 20 tablet 0   sevelamer carbonate (RENVELA) 800 MG tablet Take 1 tablet (800 mg total) by mouth 3 (three) times daily with meals. (Patient taking differently: Take 1,600-2,400 mg by mouth See admin instructions. Take 2400 mg with each meal and 1600 mg with each snack)     prochlorperazine (COMPAZINE) 10 MG tablet Take 10 mg by mouth every 6 (six) hours as needed (Nausea or vomiting).     No current facility-administered medications for this visit.    REVIEW OF SYSTEMS:   [X]  denotes positive finding, [ ]  denotes negative finding Cardiac  Comments:  Chest pain or chest pressure:    Shortness of breath upon exertion:    Short of breath when lying flat:    Irregular heart rhythm:        Vascular    Pain in calf, thigh, or hip brought on by ambulation:    Pain in feet at night that wakes you up from your sleep:     Blood clot in your veins:    Leg swelling:         Pulmonary    Oxygen at home:    Productive cough:     Wheezing:         Neurologic    Sudden weakness in arms or legs:     Sudden numbness in arms or legs:     Sudden onset of difficulty speaking or slurred speech:    Temporary loss of vision in one eye:     Problems with dizziness:         Gastrointestinal    Blood in stool:     Vomited blood:         Genitourinary    Burning when urinating:     Blood in urine:        Psychiatric    Major depression:         Hematologic    Bleeding problems:    Problems with blood clotting too easily:        Skin    Rashes or ulcers:        Constitutional    Fever or chills:      PHYSICAL EXAM:   Vitals:   02/07/21 0913  BP: 131/81  Pulse:  82  Resp: 20  Temp: 98.1 F (36.7 C)  SpO2: 95%  Weight: 185 lb (83.9 kg)  Height: 5\' 1"  (1.549 m)    GENERAL: The patient is a well-nourished female, in no acute distress. The vital signs are documented above. CARDIAC: There is a regular rate and rhythm.  VASCULAR: Tenderness at the arterial anastomosis of the left upper arm graft.  I evaluated this with SonoSite.  There is a small rim of fluid around the graft.  The area is very tender to the touch. PULMONARY: Non-labored respirations MUSCULOSKELETAL: There are no major deformities or cyanosis. NEUROLOGIC: No focal weakness or paresthesias are detected. SKIN: There are no ulcers or rashes noted. PSYCHIATRIC: The patient has a normal affect.  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  This will likely require patch angioplasty of the brachial artery.  I will plan on removing what ever graft is infected.  This will likely not require removal of the entire graft.  She will remain on her antibiotics for her sinus infection.  Her procedure is been scheduled for Thursday, January 26.    Leia Alf, MD, FACS Vascular and Vein Specialists of [facility] [phone] Pager [phone]

## 2021-02-09 ENCOUNTER — Encounter (HOSPITAL_COMMUNITY): Payer: Self-pay | Admitting: Surgery

## 2021-02-09 ENCOUNTER — Other Ambulatory Visit: Payer: Self-pay

## 2021-02-09 NOTE — Progress Notes (Signed)
PCP - Dr. Clayburn Pert  Cardiologist - Dr. Adora Fridge  EP- Denies  Endocrine- Denies  Pulm- Denies  Chest x-ray - Denies  EKG - 09/14/20 (E)  Stress Test - 05/01/19 (E)  ECHO - 08/09/20 (E)  Cardiac Cath - Denies  AICD-na PM-na LOOP-na  Dialysis- M-W-F, had a full session on 02/09/21  Sleep Study - Denies CPAP - Denies  LABS- 02/10/21: I-Stat 8  ASA- Denies  ERAS- No  HA1C- Denies  Anesthesia- No  Pt denies having chest pain, sob, or fever during the pre-op phone call. All instructions explained to the pt, with a verbal understanding of the material including: as of today,  stop taking all Aspirin (unless instructed by your doctor) and Other Aspirin containing products, Vitamins, Fish oils, and Herbal medications. Also stop all NSAIDS i.e. Advil, Ibuprofen, Motrin, Aleve, Anaprox, Naproxen, BC, Goody Powders, and all Supplements. Pt also instructed to wear a mask and social distance if she goes out. The opportunity to ask questions was provided.    Coronavirus Screening  Have you experienced the following symptoms:  Cough yes/no: No Fever (>100.72F)  yes/no: No Runny nose yes/no: No Sore throat yes/no: No Difficulty breathing/shortness of breath  yes/no: No  Have you or a family member traveled in the last 14 days and where? yes/no: No   If the patient indicates "YES" to the above questions, their PAT will be rescheduled to limit the exposure to others and, the surgeon will be notified. THE PATIENT WILL NEED TO BE ASYMPTOMATIC FOR 14 DAYS.   If the patient is not experiencing any of these symptoms, the PAT nurse will instruct them to NOT bring anyone with them to their appointment since they may have these symptoms or traveled as well.   Please remind your patients and families that hospital visitation restrictions are in effect and the importance of the restrictions.

## 2021-02-09 NOTE — Anesthesia Preprocedure Evaluation (Addendum)
Anesthesia Evaluation  Patient identified by MRN, date of birth, ID band Patient awake    Reviewed: Allergy & Precautions, NPO status , Patient's Chart, lab work & pertinent test results  Airway Mallampati: II  TM Distance: >3 FB Neck ROM: Full    Dental no notable dental hx.    Pulmonary neg pulmonary ROS,    Pulmonary exam normal        Cardiovascular hypertension, Pt. 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 negative psych ROS   GI/Hepatic Neg liver ROS, GERD  ,  Endo/Other  negative endocrine ROS  Renal/GU ESRF and DialysisRenal disease  negative genitourinary   Musculoskeletal  (+) Arthritis , Osteoarthritis,    Abdominal Normal abdominal exam  (+)   Peds  Hematology  (+) anemia ,   Anesthesia Other Findings   Reproductive/Obstetrics                            Anesthesia Physical Anesthesia Plan  ASA: 3  Anesthesia Plan: MAC   Post-op Pain Management:    Induction: Intravenous  PONV Risk Score and Plan: 2 and Ondansetron, Propofol infusion and Treatment may vary due to age or medical condition  Airway Management Planned: Simple Face Mask, Natural Airway and Nasal Cannula  Additional Equipment: None  Intra-op Plan:   Post-operative Plan:   Informed Consent: I have reviewed the patients History and Physical, chart, labs and discussed the procedure including the risks, benefits and alternatives for the proposed anesthesia with the patient or authorized representative who has indicated his/her understanding and acceptance.     Dental advisory given  Plan Discussed with: CRNA  Anesthesia Plan Comments: (Lab Results      Component                Value               Date                      WBC                      3.8 (L)             08/20/2020                HGB                       13.3                02/10/2021                HCT                      39.0                02/10/2021                MCV                      102.5 (H)           08/20/2020                PLT  194                 08/20/2020           Lab Results      Component                Value               Date                      NA                       138                 02/10/2021                K                        4.1                 02/10/2021                CO2                      28                  08/20/2020                GLUCOSE                  99                  02/10/2021                BUN                      24 (H)              02/10/2021                CREATININE               6.00 (H)            02/10/2021                CALCIUM                  9.0                 08/20/2020                GFRNONAA                 5 (L)               08/20/2020                  ECHO 07/22: 1. Left ventricular ejection fraction, by estimation, is 55 to 60%. The  left ventricle has normal function. The left ventricle has no regional  wall motion abnormalities. There is mild left ventricular hypertrophy.  Left ventricular diastolic parameters  are indeterminate.  2. Right ventricular systolic function is normal. The right ventricular  size is mildly enlarged. There is normal pulmonary artery systolic  pressure. The estimated right ventricular systolic pressure is 40.9 mmHg.  3. The mitral valve is normal in structure. Trivial mitral valve  regurgitation. No  evidence of mitral stenosis.  4. The aortic valve is tricuspid. Aortic valve regurgitation is not  visualized. Mild aortic valve sclerosis is present, with no evidence of  aortic valve stenosis.  5. The inferior vena cava is normal in size with <50% respiratory  variability, suggesting right atrial pressure of 8 mmHg. )       Anesthesia Quick Evaluation

## 2021-02-10 ENCOUNTER — Encounter (HOSPITAL_COMMUNITY): Payer: Self-pay | Admitting: Surgery

## 2021-02-10 ENCOUNTER — Other Ambulatory Visit: Payer: Self-pay

## 2021-02-10 ENCOUNTER — Ambulatory Visit (HOSPITAL_COMMUNITY): Payer: Medicare Other | Admitting: Anesthesiology

## 2021-02-10 ENCOUNTER — Observation Stay (HOSPITAL_COMMUNITY)
Admission: RE | Admit: 2021-02-10 | Discharge: 2021-02-11 | Disposition: A | Discharge status: Home / Self Care | Payer: Medicare Other | Attending: Surgery | Admitting: Surgery

## 2021-02-10 ENCOUNTER — Encounter (HOSPITAL_COMMUNITY)
Admission: RE | Disposition: A | Discharge status: Home / Self Care | Payer: Self-pay | Source: Home / Self Care | Attending: Surgery

## 2021-02-10 DIAGNOSIS — N186 End stage renal disease: Secondary | ICD-10-CM

## 2021-02-10 DIAGNOSIS — Z79899 Other long term (current) drug therapy: Secondary | ICD-10-CM | POA: Insufficient documentation

## 2021-02-10 DIAGNOSIS — Y712 Prosthetic and other implants, materials and accessory cardiovascular devices associated with adverse incidents: Secondary | ICD-10-CM | POA: Insufficient documentation

## 2021-02-10 DIAGNOSIS — I110 Hypertensive heart disease with heart failure: Secondary | ICD-10-CM | POA: Insufficient documentation

## 2021-02-10 DIAGNOSIS — Z992 Dependence on renal dialysis: Secondary | ICD-10-CM | POA: Diagnosis not present

## 2021-02-10 DIAGNOSIS — Z853 Personal history of malignant neoplasm of breast: Secondary | ICD-10-CM | POA: Diagnosis not present

## 2021-02-10 DIAGNOSIS — T827XXA Infection and inflammatory reaction due to other cardiac and vascular devices, implants and grafts, initial encounter: Secondary | ICD-10-CM | POA: Diagnosis present

## 2021-02-10 DIAGNOSIS — I509 Heart failure, unspecified: Secondary | ICD-10-CM | POA: Diagnosis not present

## 2021-02-10 HISTORY — PX: AVGG REMOVAL: SHX5153

## 2021-02-10 LAB — POCT I-STAT, CHEM 8
BUN: 24 mg/dL — ABNORMAL HIGH (ref 8–23)
Calcium, Ion: 1.01 mmol/L — ABNORMAL LOW (ref 1.15–1.40)
Chloride: 97 mmol/L — ABNORMAL LOW (ref 98–111)
Creatinine, Ser: 6 mg/dL — ABNORMAL HIGH (ref 0.44–1.00)
Glucose, Bld: 99 mg/dL (ref 70–99)
HCT: 39 % (ref 36.0–46.0)
Hemoglobin: 13.3 g/dL (ref 12.0–15.0)
Potassium: 4.1 mmol/L (ref 3.5–5.1)
Sodium: 138 mmol/L (ref 135–145)
TCO2: 30 mmol/L (ref 22–32)

## 2021-02-10 SURGERY — REMOVAL OF ARTERIOVENOUS GORETEX GRAFT (AVGG)
Anesthesia: Monitor Anesthesia Care | Site: Arm Lower | Laterality: Left

## 2021-02-10 MED ORDER — DEXAMETHASONE SODIUM PHOSPHATE 10 MG/ML IJ SOLN
INTRAMUSCULAR | Status: DC | PRN
Start: 2021-02-10 — End: 2021-02-10
  Administered 2021-02-10: 5 mg via INTRAVENOUS

## 2021-02-10 MED ORDER — ONDANSETRON HCL 4 MG/2ML IJ SOLN: 4.0000 mg | Freq: Four times a day (QID) | INTRAMUSCULAR | Status: DC | PRN

## 2021-02-10 MED ORDER — SODIUM CHLORIDE 0.9% FLUSH: 3.0000 mL | INTRAVENOUS | Status: DC | PRN

## 2021-02-10 MED ORDER — HEPARIN 6000 UNIT IRRIGATION SOLUTION
Status: AC
  Filled 2021-02-10: qty 500

## 2021-02-10 MED ORDER — FENTANYL CITRATE (PF) 250 MCG/5ML IJ SOLN
INTRAMUSCULAR | Status: AC
  Filled 2021-02-10: qty 5

## 2021-02-10 MED ORDER — SODIUM CHLORIDE 0.9 % IV SOLN: INTRAVENOUS | Status: DC

## 2021-02-10 MED ORDER — CHLORHEXIDINE GLUCONATE 0.12 % MT SOLN
15.0000 mL | Freq: Once | OROMUCOSAL | Status: AC
  Administered 2021-02-10: 15 mL via OROMUCOSAL
  Filled 2021-02-10: qty 15

## 2021-02-10 MED ORDER — PANTOPRAZOLE SODIUM 40 MG PO TBEC
40.0000 mg | DELAYED_RELEASE_TABLET | Freq: Every day | ORAL | Status: DC
  Filled 2021-02-10: qty 1

## 2021-02-10 MED ORDER — PROPOFOL 10 MG/ML IV BOLUS
INTRAVENOUS | Status: DC | PRN
  Administered 2021-02-10: 80 mg via INTRAVENOUS

## 2021-02-10 MED ORDER — CEFAZOLIN SODIUM-DEXTROSE 2-4 GM/100ML-% IV SOLN
2.0000 g | INTRAVENOUS | Status: AC
  Administered 2021-02-10: 2 g via INTRAVENOUS
  Filled 2021-02-10: qty 100

## 2021-02-10 MED ORDER — ALUM & MAG HYDROXIDE-SIMETH 200-200-20 MG/5ML PO SUSP: 15.0000 mL | ORAL | Status: DC | PRN

## 2021-02-10 MED ORDER — OXYCODONE-ACETAMINOPHEN 5-325 MG PO TABS
1.0000 | ORAL_TABLET | ORAL | Status: DC | PRN
  Administered 2021-02-11: 2 via ORAL
  Filled 2021-02-10: qty 2
  Filled 2021-02-10: qty 1

## 2021-02-10 MED ORDER — FENTANYL CITRATE (PF) 100 MCG/2ML IJ SOLN
INTRAMUSCULAR | Status: AC
  Filled 2021-02-10: qty 2

## 2021-02-10 MED ORDER — DILTIAZEM HCL 60 MG PO TABS
30.0000 mg | ORAL_TABLET | ORAL | Status: DC
  Filled 2021-02-10: qty 1

## 2021-02-10 MED ORDER — HEMOSTATIC AGENTS (NO CHARGE) OPTIME
TOPICAL | Status: DC | PRN
  Administered 2021-02-10: 1 via TOPICAL

## 2021-02-10 MED ORDER — CHLORHEXIDINE GLUCONATE 4 % EX LIQD: 60.0000 mL | Freq: Once | CUTANEOUS | Status: DC

## 2021-02-10 MED ORDER — PHENYLEPHRINE HCL-NACL 20-0.9 MG/250ML-% IV SOLN
INTRAVENOUS | Status: DC | PRN
  Administered 2021-02-10: 50 ug/min via INTRAVENOUS

## 2021-02-10 MED ORDER — PROPOFOL 500 MG/50ML IV EMUL
INTRAVENOUS | Status: DC | PRN
  Administered 2021-02-10: 75 ug/kg/min via INTRAVENOUS

## 2021-02-10 MED ORDER — LIDOCAINE-EPINEPHRINE (PF) 1 %-1:200000 IJ SOLN
INTRAMUSCULAR | Status: AC
  Filled 2021-02-10: qty 30

## 2021-02-10 MED ORDER — SEVELAMER CARBONATE 800 MG PO TABS
2400.0000 mg | ORAL_TABLET | Freq: Three times a day (TID) | ORAL | Status: DC
  Administered 2021-02-10: 2400 mg via ORAL
  Filled 2021-02-10 (×2): qty 3

## 2021-02-10 MED ORDER — ACETAMINOPHEN 650 MG RE SUPP: 325.0000 mg | RECTAL | Status: DC | PRN

## 2021-02-10 MED ORDER — ORAL CARE MOUTH RINSE: 15.0000 mL | Freq: Once | OROMUCOSAL | Status: AC

## 2021-02-10 MED ORDER — PROPOFOL 10 MG/ML IV BOLUS
INTRAVENOUS | Status: AC
  Filled 2021-02-10: qty 20

## 2021-02-10 MED ORDER — LIDOCAINE-EPINEPHRINE (PF) 1 %-1:200000 IJ SOLN
INTRAMUSCULAR | Status: DC | PRN
Start: 2021-02-10 — End: 2021-02-10
  Administered 2021-02-10: 8 mL

## 2021-02-10 MED ORDER — HYDRALAZINE HCL 20 MG/ML IJ SOLN: 5.0000 mg | INTRAMUSCULAR | Status: DC | PRN

## 2021-02-10 MED ORDER — GUAIFENESIN-DM 100-10 MG/5ML PO SYRP: 15.0000 mL | ORAL_SOLUTION | ORAL | Status: DC | PRN

## 2021-02-10 MED ORDER — HYDROMORPHONE HCL 1 MG/ML IJ SOLN: 0.5000 mg | INTRAMUSCULAR | Status: DC | PRN

## 2021-02-10 MED ORDER — CEFAZOLIN SODIUM-DEXTROSE 1-4 GM/50ML-% IV SOLN
1.0000 g | Freq: Once | INTRAVENOUS | Status: AC
  Administered 2021-02-10: 1 g via INTRAVENOUS
  Filled 2021-02-10: qty 50

## 2021-02-10 MED ORDER — PHENOL 1.4 % MT LIQD: 1.0000 | OROMUCOSAL | Status: DC | PRN

## 2021-02-10 MED ORDER — EPHEDRINE SULFATE-NACL 50-0.9 MG/10ML-% IV SOSY: PREFILLED_SYRINGE | INTRAVENOUS | Status: DC | PRN

## 2021-02-10 MED ORDER — SODIUM CHLORIDE 0.9 % IV SOLN: 250.0000 mL | INTRAVENOUS | Status: DC | PRN

## 2021-02-10 MED ORDER — ACETAMINOPHEN 325 MG PO TABS: 325.0000 mg | ORAL_TABLET | ORAL | Status: DC | PRN

## 2021-02-10 MED ORDER — PROTAMINE SULFATE 10 MG/ML IV SOLN
INTRAVENOUS | Status: DC | PRN
  Administered 2021-02-10 (×5): 10 mg via INTRAVENOUS

## 2021-02-10 MED ORDER — PROCHLORPERAZINE MALEATE 10 MG PO TABS
10.0000 mg | ORAL_TABLET | Freq: Four times a day (QID) | ORAL | Status: DC | PRN
  Filled 2021-02-10: qty 1

## 2021-02-10 MED ORDER — FENTANYL CITRATE (PF) 100 MCG/2ML IJ SOLN
25.0000 ug | INTRAMUSCULAR | Status: DC | PRN
  Administered 2021-02-10 (×2): 50 ug via INTRAVENOUS

## 2021-02-10 MED ORDER — LABETALOL HCL 5 MG/ML IV SOLN: 10.0000 mg | INTRAVENOUS | Status: DC | PRN

## 2021-02-10 MED ORDER — ATORVASTATIN CALCIUM 10 MG PO TABS
10.0000 mg | ORAL_TABLET | Freq: Every evening | ORAL | Status: DC
  Administered 2021-02-10: 10 mg via ORAL
  Filled 2021-02-10: qty 1

## 2021-02-10 MED ORDER — HEPARIN SODIUM (PORCINE) 1000 UNIT/ML IJ SOLN
INTRAMUSCULAR | Status: DC | PRN
  Administered 2021-02-10: 8000 [IU] via INTRAVENOUS

## 2021-02-10 MED ORDER — MIDODRINE HCL 5 MG PO TABS
20.0000 mg | ORAL_TABLET | ORAL | Status: DC
  Filled 2021-02-10: qty 4

## 2021-02-10 MED ORDER — FENTANYL CITRATE (PF) 250 MCG/5ML IJ SOLN
INTRAMUSCULAR | Status: DC | PRN
  Administered 2021-02-10 (×2): 50 ug via INTRAVENOUS

## 2021-02-10 MED ORDER — HEPARIN 6000 UNIT IRRIGATION SOLUTION
Status: DC | PRN
  Administered 2021-02-10: 1

## 2021-02-10 MED ORDER — POTASSIUM CHLORIDE CRYS ER 20 MEQ PO TBCR: 20.0000 meq | EXTENDED_RELEASE_TABLET | Freq: Once | ORAL | Status: DC

## 2021-02-10 MED ORDER — 0.9 % SODIUM CHLORIDE (POUR BTL) OPTIME
TOPICAL | Status: DC | PRN
  Administered 2021-02-10: 1000 mL

## 2021-02-10 MED ORDER — DILTIAZEM HCL ER COATED BEADS 120 MG PO CP24
120.0000 mg | ORAL_CAPSULE | ORAL | Status: DC
  Administered 2021-02-10: 120 mg via ORAL
  Filled 2021-02-10: qty 1

## 2021-02-10 MED ORDER — SODIUM CHLORIDE 0.9% FLUSH
3.0000 mL | Freq: Two times a day (BID) | INTRAVENOUS | Status: DC
  Administered 2021-02-10 – 2021-02-11 (×2): 3 mL via INTRAVENOUS

## 2021-02-10 MED ORDER — ACETAMINOPHEN 10 MG/ML IV SOLN: 1000.0000 mg | Freq: Once | INTRAVENOUS | Status: DC | PRN

## 2021-02-10 MED ORDER — METOPROLOL TARTRATE 5 MG/5ML IV SOLN: 2.0000 mg | INTRAVENOUS | Status: DC | PRN

## 2021-02-10 SURGICAL SUPPLY — 47 items
BAG COUNTER SPONGE SURGICOUNT (BAG) ×2 IMPLANT
BNDG ELASTIC 4X5.8 VLCR STR LF (GAUZE/BANDAGES/DRESSINGS) IMPLANT
BNDG ELASTIC 6X5.8 VLCR STR LF (GAUZE/BANDAGES/DRESSINGS) ×1 IMPLANT
CANISTER SUCT 3000ML PPV (MISCELLANEOUS) ×2 IMPLANT
CLIP VESOCCLUDE MED 6/CT (CLIP) ×2 IMPLANT
CLIP VESOCCLUDE SM WIDE 6/CT (CLIP) ×2 IMPLANT
CNTNR URN SCR LID CUP LEK RST (MISCELLANEOUS) IMPLANT
CONT SPEC 4OZ STRL OR WHT (MISCELLANEOUS) ×2
COVER PROBE W GEL 5X96 (DRAPES) ×1 IMPLANT
DECANTER SPIKE VIAL GLASS SM (MISCELLANEOUS) ×2 IMPLANT
DERMABOND ADVANCED (GAUZE/BANDAGES/DRESSINGS) ×1
DERMABOND ADVANCED .7 DNX12 (GAUZE/BANDAGES/DRESSINGS) ×1 IMPLANT
ELECT REM PT RETURN 9FT ADLT (ELECTROSURGICAL) ×2
ELECTRODE REM PT RTRN 9FT ADLT (ELECTROSURGICAL) ×1 IMPLANT
GAUZE 4X4 16PLY ~~LOC~~+RFID DBL (SPONGE) ×1 IMPLANT
GAUZE SPONGE 4X4 12PLY STRL (GAUZE/BANDAGES/DRESSINGS) ×2 IMPLANT
GLOVE SRG 8 PF TXTR STRL LF DI (GLOVE) ×1 IMPLANT
GLOVE SURG POLY MICRO LF SZ6 (GLOVE) ×1 IMPLANT
GLOVE SURG POLYISO LF SZ6.5 (GLOVE) ×4 IMPLANT
GLOVE SURG POLYISO LF SZ7 (GLOVE) ×1 IMPLANT
GLOVE SURG POLYISO LF SZ7.5 (GLOVE) ×2 IMPLANT
GLOVE SURG UNDER POLY LF SZ7 (GLOVE) ×6 IMPLANT
GLOVE SURG UNDER POLY LF SZ8 (GLOVE) ×2
GOWN STRL REUS W/ TWL LRG LVL3 (GOWN DISPOSABLE) ×2 IMPLANT
GOWN STRL REUS W/ TWL XL LVL3 (GOWN DISPOSABLE) ×1 IMPLANT
GOWN STRL REUS W/TWL LRG LVL3 (GOWN DISPOSABLE) ×12
GOWN STRL REUS W/TWL XL LVL3 (GOWN DISPOSABLE) ×2
HEMOSTAT SNOW SURGICEL 2X4 (HEMOSTASIS) IMPLANT
KIT BASIN OR (CUSTOM PROCEDURE TRAY) ×2 IMPLANT
KIT TURNOVER KIT B (KITS) ×2 IMPLANT
LOOP VESSEL MINI RED (MISCELLANEOUS) ×1 IMPLANT
NDL HYPO 25GX1X1/2 BEV (NEEDLE) ×1 IMPLANT
NEEDLE HYPO 25GX1X1/2 BEV (NEEDLE) IMPLANT
NS IRRIG 1000ML POUR BTL (IV SOLUTION) ×2 IMPLANT
PACK CV ACCESS (CUSTOM PROCEDURE TRAY) ×2 IMPLANT
PAD ARMBOARD 7.5X6 YLW CONV (MISCELLANEOUS) ×4 IMPLANT
SPONGE T-LAP 18X18 ~~LOC~~+RFID (SPONGE) ×2 IMPLANT
SURGIFLO W/THROMBIN 8M KIT (HEMOSTASIS) ×1 IMPLANT
SUT ETHILON 3 0 PS 1 (SUTURE) IMPLANT
SUT PROLENE 6 0 BV (SUTURE) ×5 IMPLANT
SUT VIC AB 3-0 SH 27 (SUTURE) ×2
SUT VIC AB 3-0 SH 27X BRD (SUTURE) ×1 IMPLANT
SUT VIC AB 4-0 PS2 18 (SUTURE) ×1 IMPLANT
SUT VICRYL 4-0 PS2 18IN ABS (SUTURE) IMPLANT
TOWEL GREEN STERILE (TOWEL DISPOSABLE) ×2 IMPLANT
UNDERPAD 30X36 HEAVY ABSORB (UNDERPADS AND DIAPERS) ×2 IMPLANT
WATER STERILE IRR 1000ML POUR (IV SOLUTION) ×2 IMPLANT

## 2021-02-10 NOTE — Anesthesia Procedure Notes (Signed)
Procedure Name: LMA Insertion Date/Time: 02/10/2021 8:25 AM Performed by: Eligha Bridegroom, CRNA Pre-anesthesia Checklist: Patient identified, Emergency Drugs available, Suction available, Patient being monitored and Timeout performed Patient Re-evaluated:Patient Re-evaluated prior to induction Oxygen Delivery Method: Circle system utilized Preoxygenation: Pre-oxygenation with 100% oxygen Induction Type: IV induction LMA: LMA inserted LMA Size: 4.0 Number of attempts: 1 Placement Confirmation: positive ETCO2 and breath sounds checked- equal and bilateral Tube secured with: Tape

## 2021-02-10 NOTE — Interval H&P Note (Signed)
History and Physical Interval Note:  02/10/2021 7:16 AM  Karen [name]  has presented today for surgery, with the diagnosis of ESRD, Left arm swelling.  The various methods of treatment have been discussed with the patient and family. After consideration of risks, benefits and other options for treatment, the patient has consented to  Procedure(s): REMOVAL OF LEFT ARM DIALYSIS GRAFT AND BRACHIAL ARTERY PATCH ANGIOPLASTY (Left) as a surgical intervention.  The patient's history has been reviewed, patient examined, no change in status, stable for surgery.  I have reviewed the patient's chart and labs.  Questions were answered to the patient's satisfaction.     [name]

## 2021-02-10 NOTE — Plan of Care (Signed)
  Problem: Education: Goal: Knowledge of General Education information will improve Description Including pain rating scale, medication(s)/side effects and non-pharmacologic comfort measures Outcome: Progressing   

## 2021-02-10 NOTE — Transfer of Care (Signed)
Immediate Anesthesia Transfer of Care Note  Patient: Karen [name]  Procedure(s) Performed: REMOVAL OF LEFT ARM DIALYSIS GRAFT AND BRACHIAL ARTERY PATCH ANGIOPLASTY (Left: Arm Lower)  Patient Location: PACU  Anesthesia Type:General  Level of Consciousness: drowsy  Airway & Oxygen Therapy: Patient Spontanous Breathing and Patient connected to nasal cannula oxygen  Post-op Assessment: Report given to RN and Post -op Vital signs reviewed and stable  Post vital signs: Reviewed and stable  Last Vitals:  Vitals Value Taken Time  BP 141/66 02/10/21 1012  Temp    Pulse 91 02/10/21 1014  Resp 14 02/10/21 1014  SpO2 100 % 02/10/21 1014  Vitals shown include unvalidated device data.  Last Pain:  Vitals:   02/10/21 0625  TempSrc:   PainSc: 0-No pain      Patients Stated Pain Goal: 2 (95/18/84 1660)  Complications: No notable events documented.

## 2021-02-10 NOTE — Anesthesia Postprocedure Evaluation (Signed)
Anesthesia Post Note  Patient: Karen [name]  Procedure(s) Performed: REMOVAL OF LEFT ARM DIALYSIS GRAFT AND BRACHIAL ARTERY PATCH ANGIOPLASTY (Left: Arm Lower)     Patient location during evaluation: PACU Anesthesia Type: General Level of consciousness: awake and alert Pain management: pain level controlled Vital Signs Assessment: post-procedure vital signs reviewed and stable Respiratory status: spontaneous breathing, nonlabored ventilation, respiratory function stable and patient connected to nasal cannula oxygen Cardiovascular status: blood pressure returned to baseline and stable Postop Assessment: no apparent nausea or vomiting Anesthetic complications: no   No notable events documented.  Last Vitals:  Vitals:   02/10/21 1330 02/10/21 1412  BP: 130/69 (!) 160/82  Pulse: 74 79  Resp: 13 20  Temp: 36.8 C 36.6 C  SpO2: 100% 100%    Last Pain:  Vitals:   02/10/21 1412  TempSrc: Oral  PainSc:                  March Rummage Ysabel Cowgill

## 2021-02-10 NOTE — Progress Notes (Signed)
Pt transferred to 4E from PACU.

## 2021-02-10 NOTE — Progress Notes (Signed)
Around 1230 the patient stated she had increased pain and weakness in her post-surgical LUE. Patient's LUE had +2 pulses, is warm, not swollen, and color appropriate for ethnicity. Patient's VSS. Patient was given medication for pain. Patient was able to wiggle fingers and has a moderate grip. Dr. Trula Slade was paged and I spoke with him at 1237. At 1243 Dr. Stephens Shire PA came to the bedside to evaluate the patient. PA assessed the patient and gave no new orders.

## 2021-02-10 NOTE — Op Note (Addendum)
° ° °  Patient name: Karen [name] MRN: [id number] DOB: [birth year] Sex: female  02/10/2021 Pre-operative Diagnosis: Possible infected left upper arm dialysis graft Post-operative diagnosis:  Same Surgeon:  [name] Assistants:  Ivin Booty Procedure:   #1: Left brachial artery vein patch angioplasty   #2: Removal of left arm dialysis graft Anesthesia:  MAC Blood Loss:  minimal Specimens: Section of dialysis graft sent for culture  Findings: Basilic vein was used for brachial artery vein patch after removing her dialysis graft.  The dialysis graft was well incorporated.  Indications: This is a 66 year old female with end-stage renal disease who has been complaining of pain at her defunctionalized left upper arm dialysis graft.  We discussed removal of the graft.  Procedure:  The patient was identified in the holding area and taken to [place] [facility]  The patient was then placed supine on the table. MAC anesthesia was administered.  The patient was prepped and draped in the usual sterile fashion.  A time out was called and antibiotics were administered.  A PA was necessary to assist with exposure through counter tension, suctioning, ligation and retraction to better visualize the surgical field.  The PA expedited sewing during the case by following my sutures.  The PA also helped with wound closure.  Ultrasound was used to evaluate the surface veins in the left arm.  She had an adequate appearing basilic vein.  I then made a longitudinal incision at the level of the antecubital crease after anesthetizing the skin with 1% lidocaine.  I then proceeded to dissect out the brachial artery as well as the brachial vein and the dialysis graft.  The graft was actually well incorporated.  There were no fluid collections.  Once I had adequate exposure, the patient was fully heparinized.  After the heparin circulated, the brachial artery was occluded with baby Gregory clamps.  An 11 blade was used to  transect the graft and then remove it from the brachial artery.  The artery was very healthy.  All of the graft was removed from the artery.  I then ligated the basilic vein to use for the vein patch.  Patch angioplasty was performed with a running 6-0 Prolene.  Prior to completion the appropriate flushing maneuvers were performed and the anastomosis was completed.  The patient had brisk radial artery Doppler flow.  I then proceeded to transect the graft as far up into the arm as I could.  It was well incorporated.  Next, the patient's heparin was reversed with 50 mg of protamine.  Hemostasis was achieved.  The subcutaneous tissue was then reapproximated with 3-0 Vicryl and the skin was closed with 4-0 Vicryl followed by Dermabond.  There were no immediate complications.   Disposition: To PACU stable.   Theotis Burrow, M.D., [facility] [place] Office: [phone] Pager:  [phone]

## 2021-02-10 NOTE — Anesthesia Procedure Notes (Signed)
Procedure Name: MAC Date/Time: 02/10/2021 7:35 AM Performed by: Eligha Bridegroom, CRNA Pre-anesthesia Checklist: Patient identified, Emergency Drugs available, Suction available, Patient being monitored and Timeout performed Patient Re-evaluated:Patient Re-evaluated prior to induction Oxygen Delivery Method: Nasal cannula Induction Type: IV induction

## 2021-02-11 ENCOUNTER — Encounter (HOSPITAL_COMMUNITY): Payer: Self-pay | Admitting: Surgery

## 2021-02-11 DIAGNOSIS — T827XXA Infection and inflammatory reaction due to other cardiac and vascular devices, implants and grafts, initial encounter: Secondary | ICD-10-CM | POA: Diagnosis not present

## 2021-02-11 MED ORDER — CEPHALEXIN 500 MG PO CAPS: 500.0000 mg | ORAL_CAPSULE | Freq: Two times a day (BID) | ORAL | 0 refills | Status: DC

## 2021-02-11 MED ORDER — HYDROCODONE-ACETAMINOPHEN 5-325 MG PO TABS: 1.0000 | ORAL_TABLET | Freq: Four times a day (QID) | ORAL | 0 refills | Status: DC | PRN

## 2021-02-11 NOTE — Progress Notes (Signed)
Patient given discharge instructions. PIV removed. Telemetry removed, CCMD notified. Patient taken to vehicle by wheelchair by staff.  Daymon Larsen, RN

## 2021-02-11 NOTE — Progress Notes (Signed)
Requested to see pt by Dr Jonnie Finner. Spoke to [employer] at [employer]. Pt can receive out-pt HD at [facility] as long as pt arrives at 11:00 for 11:15 chair time. Spoke to pt and pt's son via phone who are aware of times and agreeable to plan. Contacted vascular PA, Dr Jonnie Finner, and pt's RN to provide update on the above info. Pt will need to be dc by 10:30 in order to make it to clinic for out-pt HD. Anne at [place] aware of plans for pt's d/c this am in order to get out-pt treatment at clinic at 11:00.   Melven Sartorius Renal Navigator [phone]

## 2021-02-11 NOTE — Progress Notes (Addendum)
°  Progress Note    02/11/2021 7:14 AM 1 Day Post-Op  Subjective:  just received some pain medicine.  Otherwise no complaints.  Denies any pain or numbness in her left hand  Afebrile   Vitals:   02/10/21 2340 02/11/21 0348  BP: 131/69 (!) 155/71  Pulse: 77 85  Resp: 14 20  Temp: 98.4 F (36.9 C) 98.3 F (36.8 C)  SpO2: 97% 100%    Physical Exam: Cardiac:  regular Lungs:  non labored Incisions:  clean with mild ecchymosis around incision and mild fullness Extremities:  easily palpable left radial pulse; motor and sensory are in tact.   CBC    Component Value Date/Time   WBC 3.8 (L) 08/20/2020 0834   RBC 2.77 (L) 08/20/2020 0834   HGB 13.3 02/10/2021 0616   HGB 9.6 (L) 08/06/2018 0751   HCT 39.0 02/10/2021 0616   PLT 194 08/20/2020 0834   PLT 176 08/06/2018 0751   MCV 102.5 (H) 08/20/2020 0834   MCH 32.5 08/20/2020 0834   MCHC 31.7 08/20/2020 0834   RDW 14.6 08/20/2020 0834   LYMPHSABS 0.7 08/06/2018 0751   MONOABS 0.5 08/06/2018 0751   EOSABS 0.3 08/06/2018 0751   BASOSABS 0.1 08/06/2018 0751    BMET    Component Value Date/Time   NA 138 02/10/2021 0616   K 4.1 02/10/2021 0616   CL 97 (L) 02/10/2021 0616   CO2 28 08/20/2020 0834   GLUCOSE 99 02/10/2021 0616   BUN 24 (H) 02/10/2021 0616   CREATININE 6.00 (H) 02/10/2021 0616   CREATININE 7.25 (HH) 08/06/2018 0751   CREATININE 5.71 (H) 02/27/2014 0900   CALCIUM 9.0 08/20/2020 0834   CALCIUM 9.0 01/28/2014 1454   GFRNONAA 5 (L) 08/20/2020 0834   GFRNONAA 5 (L) 08/06/2018 0751   GFRAA 5 (L) 04/15/2019 1709   GFRAA 6 (L) 08/06/2018 0751    INR    Component Value Date/Time   INR 1.1 08/02/2020 0942     Intake/Output Summary (Last 24 hours) at 02/11/2021 0714 Last data filed at 02/11/2021 0410 Gross per 24 hour  Intake 790 ml  Output 0 ml  Net 790 ml   Specimen Description TISSUE   Special Requests DIALYSIS GRAFT FROM LEFT ARM SPEC A   Gram Stain FEW WBC PRESENT, PREDOMINANTLY MONONUCLEAR  NO  ORGANISMS SEEN  Performed at [facility], [address] [place], [place] [address]   Culture PENDING   Report Status PENDING    Assessment/Plan:  66 y.o. female is s/p:  Removal of left arm graft and left brachial artery vein patch angioplasty  1 Day Post-Op   -pt with palpable left radial pulse and motor and sensation in tact left hand. -incision fine with some ecchymosis around incision and mild fullness.   -talked with Dr. Melvia Heaps about her dialysis today - he will follow up. -no organisms on gram stain-culture pending.  Per Dr. Trula Slade, will dc home on course of Keflex. -home later today after decision about dialysis -f/u in our office in a couple of weeks for wound check.  Leontine Locket, PA-C [facility] [phone] 02/11/2021 7:14 AM

## 2021-02-11 NOTE — Discharge Summary (Signed)
Discharge Summary    Karen [name] [birth year] 66 y.o. female  [id number]  Admission Date: 02/10/2021  Discharge Date: 02/11/2021   Physician: Serafina Mitchell, MD  Admission Diagnosis: ESRD (end stage renal disease) on dialysis [facility]) [N18.6, Z99.2]   HPI:   This is a 66 y.o. female who is status post right basilic vein fistula on 4/0/9811.  Prior to this, she had a failed right arm as well as left arm graft.  She presented on 04/22/2019 with decreased flow rates and underwent fistulogram.  She was found to have a stenotic area in the midportion of her fistula which was treated with drug-coated balloon venoplasty.  She continued to have difficulty and underwent fistulogram on 06/12/2019.  She was found to have recurrent stenosis which was treated using an 8 x 60 INNOVA stent.  On 02/17/2020 she returned for recurrent symptoms.  She was treated with drug-coated balloon venoplasty using 8 mm balloon however she did not tolerate this very well secondary to pain.  She again required treatment on 11/23/2020.   Approximately 2 weeks ago, she began having pain and some redness over top of her left upper arm graft which is nonfunctional.  She was recently started on antibiotics for a sinus infection.  Findings:  Basilic vein was used for brachial artery vein patch after removing her dialysis graft.  The dialysis graft was well incorporated.  Hospital Course:  The patient was admitted to the hospital and taken to the operating room on 02/10/2021 and underwent: Removal of left arm graft and left brachial artery vein patch angioplasty    The pt tolerated the procedure well and was transported to the PACU in good condition.   By POD 1, pt doing well with palpable left radial pulse and motor and sensory in tact.  Incision with mild fullness and some ecchymosis present.  She is dc'd home on 7 day course of abx.    CBC    Component Value Date/Time   WBC 3.8 (L) 08/20/2020 0834   RBC 2.77  (L) 08/20/2020 0834   HGB 13.3 02/10/2021 0616   HGB 9.6 (L) 08/06/2018 0751   HCT 39.0 02/10/2021 0616   PLT 194 08/20/2020 0834   PLT 176 08/06/2018 0751   MCV 102.5 (H) 08/20/2020 0834   MCH 32.5 08/20/2020 0834   MCHC 31.7 08/20/2020 0834   RDW 14.6 08/20/2020 0834   LYMPHSABS 0.7 08/06/2018 0751   MONOABS 0.5 08/06/2018 0751   EOSABS 0.3 08/06/2018 0751   BASOSABS 0.1 08/06/2018 0751    BMET    Component Value Date/Time   NA 138 02/10/2021 0616   K 4.1 02/10/2021 0616   CL 97 (L) 02/10/2021 0616   CO2 28 08/20/2020 0834   GLUCOSE 99 02/10/2021 0616   BUN 24 (H) 02/10/2021 0616   CREATININE 6.00 (H) 02/10/2021 0616   CREATININE 7.25 (HH) 08/06/2018 0751   CREATININE 5.71 (H) 02/27/2014 0900   CALCIUM 9.0 08/20/2020 0834   CALCIUM 9.0 01/28/2014 1454   GFRNONAA 5 (L) 08/20/2020 0834   GFRNONAA 5 (L) 08/06/2018 0751   GFRAA 5 (L) 04/15/2019 1709   GFRAA 6 (L) 08/06/2018 0751      Discharge Instructions     Call MD for:  redness, tenderness, or signs of infection (pain, swelling, bleeding, redness, odor or green/yellow discharge around incision site)   Complete by: As directed    Call MD for:  severe or increased pain, loss or decreased feeling  in affected limb(s)  Complete by: As directed    Call MD for:  temperature >100.5   Complete by: As directed    Discharge wound care:   Complete by: As directed    Shower daily with soap and water   Driving Restrictions   Complete by: As directed    No for 24 hours and while taking pain medication.   Lifting restrictions   Complete by: As directed    No heavy lifting for 3 weeks with left arm   Resume previous diet   Complete by: As directed        Discharge Diagnosis:  ESRD (end stage renal disease) on dialysis ([place]) [N18.6, Z99.2]  Secondary Diagnosis: Patient Active Problem List   Diagnosis Date Noted   ESRD (end stage renal disease) on dialysis ([place]) 02/10/2021   Acute on chronic anemia    Acute  post-traumatic headache, not intractable    Traumatic subdural hematoma 08/07/2020   Subdural hematoma 08/02/2020   S/P craniotomy 08/02/2020   Hyperlipidemia 02/20/2019   Acute idiopathic pericarditis    Chest pain 01/06/2018   S/P mastectomy, right 11/20/2017   Port-A-Cath in place 07/17/2017   Malignant neoplasm of upper-outer quadrant of right breast in female, estrogen receptor negative ([place]) 07/02/2017   Cough 03/06/2017   Elevated troponin I level 06/07/2015   Paroxysmal atrial fibrillation ([place]) 06/07/2015   Klebsiella pneumoniae sepsis ([place]) 12/22/2014   Sinus tachycardia 12/21/2014   Dyspnea 12/21/2014   Infection of urinary tract 12/20/2014   Sepsis ([place]) 12/19/2014   Fever, unspecified 12/17/2014   Thrush 12/17/2014   Erythema induratum 12/17/2014   Obesity, Class II, BMI 35.0-39.9, with comorbidity (see actual BMI) 11/19/2014   Bilateral lower extremity edema 04/28/2014   Chronic constipation 05/26/2013   Dyspepsia 08/08/2012   Chronic nausea 08/08/2012   History of chest pain 06/12/2012   ESRD on hemodialysis ([place]) 07/04/2011   Past Medical History:  Diagnosis Date   Anemia    Arthritis    knees   Breast cancer ([place]) 2019   Right Breast Cancer   Cancer [facility])    Chest pain Jan 2016   low risk Myoview    CHF (congestive heart failure) ([place])    Colitis 12/08/2014   [facility]- focal moderate active colitis   Complication of anesthesia 2005   difficulty remembering for a while and waking up   Constipation    Dysrhythmia    h/o A-Fib   Elevated LFTs 2016   [facility]   ESRD on dialysis [facility]) April 2016   MWF [place]   Fundic gland polyps of stomach, benign    GERD (gastroesophageal reflux disease)    Heart murmur Nov 2015   Aortic scleosis- no stenosis   Hemodialysis patient ([place])    Hypertension    Shortness of breath dyspnea    with exertion     Allergies as of 02/11/2021   No Known Allergies      Medication List     TAKE  these medications    amoxicillin 500 MG tablet Commonly known as: AMOXIL Take 2,000 mg by mouth See admin instructions. Take 2000 mg 1 hour prior to dental work   atorvastatin 10 MG tablet Commonly known as: LIPITOR Take 1 tablet (10 mg total) by mouth every evening.   cephALEXin 500 MG capsule Commonly known as: KEFLEX Take 1 capsule (500 mg total) by mouth 2 (two) times daily.   clobetasol cream 0.05 % Commonly known as: TEMOVATE Apply 1 application topically  daily as needed (for rash).   diltiazem 120 MG 24 hr capsule Commonly known as: CARDIZEM CD TAKE 1 CAPSULE (120 MG TOTAL) BY MOUTH EVERY TUESDAY, THURSDAY, SATURDAY, AND SUNDAY AT 6 PM.   diltiazem 30 MG tablet Commonly known as: CARDIZEM Take 1 tablet (30 mg total) by mouth every Monday, Wednesday, and Friday.   ethyl chloride spray Apply 1 application topically every Monday, Wednesday, and Friday with hemodialysis.   fluticasone 50 MCG/ACT nasal spray Commonly known as: FLONASE Place 1 spray into both nostrils daily as needed for allergies.   HECTOROL IV Doxercalciferol (Hectorol)   HYDROcodone-acetaminophen 5-325 MG tablet Commonly known as: Norco Take 1 tablet by mouth every 6 (six) hours as needed for moderate pain.   lidocaine-prilocaine cream Commonly known as: EMLA Apply 1 application topically See admin instructions. Apply topically Monday, Wednesday and Friday before dialysis   midodrine 10 MG tablet Commonly known as: PROAMATINE Take 2 tablets (20 mg total) by mouth every Monday, Wednesday, and Friday. Prior to dialysis   MIRCERA IJ Mircera   multivitamin Tabs tablet Take 1 tablet by mouth daily.   ondansetron 8 MG tablet Commonly known as: ZOFRAN Take 1 tablet (8 mg total) by mouth every 8 (eight) hours as needed for nausea or vomiting.   prochlorperazine 10 MG tablet Commonly known as: COMPAZINE Take 10 mg by mouth every 6 (six) hours as needed (Nausea or vomiting).   SENSIPAR PO Take  1 tablet by mouth every Monday, Wednesday, and Friday.   sevelamer carbonate 800 MG tablet Commonly known as: RENVELA Take 1 tablet (800 mg total) by mouth 3 (three) times daily with meals. What changed:  how much to take when to take this additional instructions               Discharge Care Instructions  (From admission, onward)           Start     Ordered   02/11/21 0000  Discharge wound care:       Comments: Shower daily with soap and water   02/11/21 0908             Instructions: [facility] Discharge Instructions AV Fistula or Graft Surgery for Dialysis Access  Please refer to the following instructions for your post-procedure care. Your surgeon or physician assistant will discuss any changes with you.  Activity  You may drive the day following your surgery, if you are comfortable and no longer taking prescription pain medication. Resume full activity as the soreness in your incision resolves.  Bathing/Showering  You may shower after you go home. Keep your incision dry for 48 hours. Do not soak in a bathtub, hot tub, or swim until the incision heals completely. You may not shower if you have a hemodialysis catheter.  Incision Care  Clean your incision with mild soap and water after 48 hours. Pat the area dry with a clean towel. You do not need a bandage unless otherwise instructed. Do not apply any ointments or creams to your incision. You may have skin glue on your incision. Do not peel it off. It will come off on its own in about one week. Your arm may swell a bit after surgery. To reduce swelling use pillows to elevate your arm so it is above your heart. Your doctor will tell you if you need to lightly wrap your arm with an ACE bandage.  Diet  Resume your normal diet. There are not special  food restrictions following this procedure. In order to heal from your surgery, it is CRITICAL to get adequate nutrition. Your body  requires vitamins, minerals, and protein. Vegetables are the best source of vitamins and minerals. Vegetables also provide the perfect balance of protein. Processed food has little nutritional value, so try to avoid this.  Medications  Resume taking all of your medications. If your incision is causing pain, you may take over-the counter pain relievers such as acetaminophen (Tylenol). If you were prescribed a stronger pain medication, please be aware these medications can cause nausea and constipation. Prevent nausea by taking the medication with a snack or meal. Avoid constipation by drinking plenty of fluids and eating foods with high amount of fiber, such as fruits, vegetables, and grains. Do not take Tylenol if you are taking prescription pain medications.  Follow up Your surgeon may want to see you in the office following your access surgery. If so, this will be arranged at the time of your surgery.  Please call us immediately for any of the following conditions:  Increased pain, redness, drainage (pus) from your incision site Fever of 101 degrees or higher Severe or worsening pain at your incision site Hand pain or numbness.  Reduce your risk of vascular disease:  Stop smoking. If you would like help, call [facility] at 1-800-QUIT-NOW 606-031-2329) or [place] [facility] [place] your cholesterol Maintain a desired weight Control your diabetes Keep your blood pressure down  Dialysis  It will take several weeks to several months for your new dialysis access to be ready for use. Your surgeon will determine when it is OK to use it. Your nephrologist will continue to direct your dialysis. You can continue to use your Permcath until your new access is ready for use.   02/11/2021 Arnitra Sokoloski [id number] 1955-12-17  Surgeon(s): Serafina Mitchell, MD  Procedure(s): REMOVAL OF LEFT ARM DIALYSIS GRAFT AND BRACHIAL ARTERY PATCH ANGIOPLASTY  If you have any  questions, please call the office at [phone].  Prescriptions given: 1..Norco 5/325 one po q6h prn pain #12 No Refill 2.  Keflex 500mg  bid x 7 days #14 no refill  Disposition: home  Patient's condition: is Good  Follow up: 1. [facility] in 2 weeks for wound check   Leontine Locket, PA-C [facility] [phone] 02/11/2021  9:09 AM

## 2021-02-15 LAB — AEROBIC/ANAEROBIC CULTURE W GRAM STAIN (SURGICAL/DEEP WOUND): Culture: NO GROWTH

## 2021-02-16 NOTE — Progress Notes (Signed)
Patient Care Team: Lucianne Lei, MD as PCP - General (Family Medicine) Lorretta Harp, MD as PCP - Cardiology (Cardiology) Nicholas Lose, MD as PCP - Hematology/Oncology (Hematology and Oncology) Sherran Needs, NP as PCP - Electrophysiology (Nurse Practitioner) Donato Heinz, MD (Nephrology) Center, [facility]  DIAGNOSIS:    ICD-10-CM   1. Malignant neoplasm of upper-outer quadrant of right breast in female, estrogen receptor negative ([place])  C50.411    Z17.1       SUMMARY OF ONCOLOGIC HISTORY: Oncology History  Malignant neoplasm of upper-outer quadrant of right breast in female, estrogen receptor negative ([place])  06/19/2017 Initial Diagnosis   Right breast skin thickening and heaviness: Screening mammogram detected 2 cm of microcalcifications in the right breast in the posterior third UOQ.?  Skin involvement, stereotactic biopsy ([id number]) revealed IDC grade 2-3 with high-grade DCIS, lymphovascular invasion present, ER 0%, PR 0%, HER-2 positive ratio 8.69, gene copy #11.3 T1CN0 stage Ia   07/02/2017 Cancer Staging   Staging form: Breast, AJCC 8th Edition - Clinical: Stage IA (cT1c, cN0, cM0, G3, ER-, PR-, HER2+) - Signed by Nicholas Lose, MD on 07/02/2017    07/17/2017 - 08/06/2018 Chemotherapy   Taxol Herceptin weekly x12 (07/17/17-09/25/17) followed by Herceptin maintenance x1 year   11/20/2017 Surgery   Right mastectomy ([id number]) under Dr. Coralie Keens: No residual invasive cancer.  0/1 lymph node negative ER: 0%, negative, PR: 0%, negative, Her2: Positive, ratio 8.69. Ki-67: 30%     CHIEF COMPLIANT: Follow-up of right breast cancer   INTERVAL HISTORY: Karen [name] is a 66 y.o. with above-mentioned history of right breast cancer treated with neoadjuvant chemotherapy, right mastectomy, maintenance Herceptin, and who is currently on surveillance. She presents to the clinic today for follow-up.   ALLERGIES:  has No Known  Allergies.  MEDICATIONS:  Current Outpatient Medications  Medication Sig Dispense Refill   amoxicillin (AMOXIL) 500 MG tablet Take 2,000 mg by mouth See admin instructions. Take 2000 mg 1 hour prior to dental work     atorvastatin (LIPITOR) 10 MG tablet Take 1 tablet (10 mg total) by mouth every evening. 90 tablet 1   cephALEXin (KEFLEX) 500 MG capsule Take 1 capsule (500 mg total) by mouth 2 (two) times daily. 14 capsule 0   Cinacalcet HCl (SENSIPAR PO) Take 1 tablet by mouth every Monday, Wednesday, and Friday.     clobetasol cream (TEMOVATE) 0.96 % Apply 1 application topically daily as needed (for rash).      diltiazem (CARDIZEM CD) 120 MG 24 hr capsule TAKE 1 CAPSULE (120 MG TOTAL) BY MOUTH EVERY TUESDAY, THURSDAY, SATURDAY, AND SUNDAY AT 6 PM. 30 capsule 11   diltiazem (CARDIZEM) 30 MG tablet Take 1 tablet (30 mg total) by mouth every Monday, Wednesday, and Friday. 135 tablet 1   Doxercalciferol (HECTOROL IV) Doxercalciferol (Hectorol)     ethyl chloride spray Apply 1 application topically every Monday, Wednesday, and Friday with hemodialysis.     fluticasone (FLONASE) 50 MCG/ACT nasal spray Place 1 spray into both nostrils daily as needed for allergies.     HYDROcodone-acetaminophen (NORCO) 5-325 MG tablet Take 1 tablet by mouth every 6 (six) hours as needed for moderate pain. 12 tablet 0   lidocaine-prilocaine (EMLA) cream Apply 1 application topically See admin instructions. Apply topically Monday, Wednesday and Friday before dialysis  6   Methoxy PEG-Epoetin Beta (MIRCERA IJ) Mircera     midodrine (PROAMATINE) 10 MG tablet Take 2 tablets (20 mg total) by mouth every Monday,  Wednesday, and Friday. Prior to dialysis 28 tablet 0   multivitamin (RENA-VIT) TABS tablet Take 1 tablet by mouth daily.      ondansetron (ZOFRAN) 8 MG tablet Take 1 tablet (8 mg total) by mouth every 8 (eight) hours as needed for nausea or vomiting. 20 tablet 0   prochlorperazine (COMPAZINE) 10 MG tablet Take 10 mg  by mouth every 6 (six) hours as needed (Nausea or vomiting).     sevelamer carbonate (RENVELA) 800 MG tablet Take 1 tablet (800 mg total) by mouth 3 (three) times daily with meals. (Patient taking differently: Take 1,600-2,400 mg by mouth See admin instructions. Take 2400 mg with each meal and 1600 mg with each snack)     No current facility-administered medications for this visit.    PHYSICAL EXAMINATION: ECOG PERFORMANCE STATUS: 1 - Symptomatic but completely ambulatory  Vitals:   02/17/21 1033  BP: (!) 149/69  Pulse: 80  Temp: 98.1 F (36.7 C)  SpO2: 100%   Filed Weights   02/17/21 1033  Weight: 181 lb 11.2 oz (82.4 kg)    BREAST: No palpable lumps or nodules in the right chest wall or axilla.  No lumps or nodules in the left breast or axilla.  (exam performed in the presence of a chaperone)  LABORATORY DATA:  I have reviewed the data as listed CMP Latest Ref Rng & Units 02/10/2021 11/23/2020 08/20/2020  Glucose 70 - 99 mg/dL 99 83 110(H)  BUN 8 - 23 mg/dL 24(H) 39(H) 31(H)  Creatinine 0.44 - 1.00 mg/dL 6.00(H) 8.00(H) 7.69(H)  Sodium 135 - 145 mmol/L 138 137 137  Potassium 3.5 - 5.1 mmol/L 4.1 5.5(H) 4.9  Chloride 98 - 111 mmol/L 97(L) 95(L) 99  CO2 22 - 32 mmol/L - - 28  Calcium 8.9 - 10.3 mg/dL - - 9.0  Total Protein 6.5 - 8.1 g/dL - - -  Total Bilirubin 0.3 - 1.2 mg/dL - - -  Alkaline Phos 38 - 126 U/L - - -  AST 15 - 41 U/L - - -  ALT 0 - 44 U/L - - -    Lab Results  Component Value Date   WBC 3.8 (L) 08/20/2020   HGB 13.3 02/10/2021   HCT 39.0 02/10/2021   MCV 102.5 (H) 08/20/2020   PLT 194 08/20/2020   NEUTROABS 2.6 08/06/2018    ASSESSMENT & PLAN:  Malignant neoplasm of upper-outer quadrant of right breast in female, estrogen receptor negative ([place]) 06/19/2017: Right breast skin thickening and heaviness: Screening mammogram detected 2 cm of microcalcifications in the right breast in the posterior third UOQ.?  Skin involvement, stereotactic biopsy revealed  IDC grade 2-3 with high-grade DCIS, lymphovascular invasion present, ER 0%, PR 0%, HER-2 positive ratio 8.69, gene copy #11.3   Neoadjuvant chemotherapy with Taxol Herceptin x10   11/20/2017:Right mastectomy: No residual invasive cancer.  0/1 lymph node negative ER: 0%, negative, PR: 0%, negative, Her2: Positive, ratio 8.69. Ki-67: 30% Herceptin maintenance completed 08/06/2018 Hospitalization: 01/06/2018 - 01/08/2018: Pericarditis unrelated to treatment: ---------------------------------------------------------------------------------------------------------------------------------------------- Breast cancer surveillance:  1. left breast mammogram  07/22/2020: Benign breast density category C 2.  Breast exam  02/17/2021: Benign, right chest wall scar is without any lumps or nodules, left breast: No lumps or nodules. No role of antiestrogen therapy because she is ER/PR negative.   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 Anemia due to chronic kidney disease  Hospitalization 02/10/2021-02/11/2021: ESRD: Decreased flow in the fistula treated with stent placement   return to clinic in 1 year  for follow-up    No orders of the defined types were placed in this encounter.  The patient has a good understanding of the overall plan. she agrees with it. she will call with any problems that may develop before the next visit here.  Total time spent: 20 mins including face to face time and time spent for planning, charting and coordination of care  Rulon Eisenmenger, MD, MPH 02/17/2021  I, Thana Ates, am acting as scribe for Dr. Nicholas Lose.  I have reviewed the above documentation for accuracy and completeness, and I agree with the above.

## 2021-02-17 ENCOUNTER — Inpatient Hospital Stay: Payer: Medicare Other | Attending: Hematology and Oncology | Admitting: Hematology and Oncology

## 2021-02-17 ENCOUNTER — Other Ambulatory Visit: Payer: Self-pay

## 2021-02-17 DIAGNOSIS — Z9011 Acquired absence of right breast and nipple: Secondary | ICD-10-CM | POA: Insufficient documentation

## 2021-02-17 DIAGNOSIS — D631 Anemia in chronic kidney disease: Secondary | ICD-10-CM | POA: Insufficient documentation

## 2021-02-17 DIAGNOSIS — N186 End stage renal disease: Secondary | ICD-10-CM | POA: Insufficient documentation

## 2021-02-17 DIAGNOSIS — Z171 Estrogen receptor negative status [ER-]: Secondary | ICD-10-CM | POA: Diagnosis not present

## 2021-02-17 DIAGNOSIS — Z992 Dependence on renal dialysis: Secondary | ICD-10-CM | POA: Insufficient documentation

## 2021-02-17 DIAGNOSIS — C50411 Malignant neoplasm of upper-outer quadrant of right female breast: Secondary | ICD-10-CM | POA: Diagnosis not present

## 2021-02-17 DIAGNOSIS — Z853 Personal history of malignant neoplasm of breast: Secondary | ICD-10-CM | POA: Insufficient documentation

## 2021-02-17 NOTE — Assessment & Plan Note (Signed)
06/19/2017: Right breast skin thickening and heaviness: Screening mammogram detected 2 cm of microcalcifications in the right breast in the posterior third UOQ.? 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 0/1 lymph node negative ER: 0%, negative, PR: 0%, negative, Her2: Positive, ratio 8.69. Ki-67: 30% Herceptin maintenance completed 08/06/2018 Hospitalization: 01/06/2018 - 01/08/2018: Pericarditis unrelated to treatment: ---------------------------------------------------------------------------------------------------------------------------------------------- Breast cancer surveillance: 1.left breastmammogram  07/22/2020: Benign breast density category C 2.Breast exam  02/17/2021: Benign, right chest wall scar is without any lumps or nodules, left breast: No lumps or nodules. No role of antiestrogen therapy because she is ER/PR negative.  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

## 2021-02-22 ENCOUNTER — Other Ambulatory Visit: Payer: Self-pay

## 2021-02-22 ENCOUNTER — Ambulatory Visit (INDEPENDENT_AMBULATORY_CARE_PROVIDER_SITE_OTHER): Payer: Medicare Other | Admitting: Physician Assistant

## 2021-02-22 VITALS — BP 179/94 | HR 81 | Temp 97.3°F | Resp 18 | Ht 61.0 in | Wt 178.7 lb

## 2021-02-22 DIAGNOSIS — Z992 Dependence on renal dialysis: Secondary | ICD-10-CM

## 2021-02-22 DIAGNOSIS — N186 End stage renal disease: Secondary | ICD-10-CM

## 2021-02-22 NOTE — Progress Notes (Signed)
° ° °  Postoperative Access Visit   History of Present Illness   Karen [name] is a 66 y.o. year old female who presents for postoperative follow-up for: left brachial artery vein patch angioplasty and removal of left arm dialysis graft by Dr. Trula Slade on 02/10/21. This was performed secondary to possible infected left upper arm dialysis graft with pain and redness. The patient's wounds are healing well. She has some swelling in area of incision but says aside from minimal soreness her left arm feels good. No numbness, pain, weakness, coldness in the left arm. No redness or fevers. The patient notes no steal symptoms.  She has a right basilic vein fistula that is functioning. She has however undergone numerous percutaneous interventions to salvage it. She dialyzes on MWF at the [facility].  Physical Examination   Vitals:   02/22/21 0955  BP: (!) 179/94  Pulse: 81  Resp: 18  Temp: (!) 97.3 F (36.3 C)  TempSrc: Temporal  SpO2: 100%  Weight: 178 lb 11.2 oz (81.1 kg)  Height: 5\' 1"  (1.549 m)   Body mass index is 33.77 kg/m.  left arm Incision is healing well, some fullness surrounding incision likely fluid collection hematoma vs seroma. No erythema. Dermabond skin glue present. 2+ radial pulse, hand grip is 5/5, sensation in digits is intact, no palpable thrill, no bruit can be auscultated.Right AV fistula with good thrill, good bruit. 2+ radial pulse. Right hand warm and well perfused    Medical Decision Making   Karen [name] is a 66 y.o. year old female who presents s/p left brachial artery vein patch angioplasty and removal of left arm dialysis graft by Dr. Trula Slade on 02/10/21. This was performed secondary to possible infected left upper arm dialysis graft with pain and redness. Her incision is healing well. Some fullness around incision which is likely hematoma vs seroma. Discussed with patient that this should resolve with time. No signs of infection.  Pathology for her graft came back with no growth. She completed course of antibiotics she was taking at time of surgery. She has a functioning right brachiobasilic vein fistula. Patent no without signs or symptoms of steal syndrome in either upper extremity She knows to follow up if she has any new or concerning symptoms  The patient may follow up on a prn basis   Karoline Caldwell, PA-C [facility] Office: [facility] MD: Roxanne Mins

## 2021-03-02 ENCOUNTER — Encounter: Payer: Medicare Other | Attending: Physical Medicine & Rehabilitation | Admitting: Physical Medicine & Rehabilitation

## 2021-03-02 ENCOUNTER — Encounter: Payer: Self-pay | Admitting: Physical Medicine & Rehabilitation

## 2021-03-02 ENCOUNTER — Other Ambulatory Visit: Payer: Self-pay

## 2021-03-02 VITALS — BP 131/83 | HR 82 | Ht 61.0 in | Wt 177.2 lb

## 2021-03-02 DIAGNOSIS — D649 Anemia, unspecified: Secondary | ICD-10-CM | POA: Diagnosis not present

## 2021-03-02 DIAGNOSIS — S065X1S Traumatic subdural hemorrhage with loss of consciousness of 30 minutes or less, sequela: Secondary | ICD-10-CM | POA: Diagnosis not present

## 2021-03-02 DIAGNOSIS — G44319 Acute post-traumatic headache, not intractable: Secondary | ICD-10-CM

## 2021-03-02 NOTE — Progress Notes (Signed)
Subjective:    Patient ID: Karen [name], female    DOB: [birth year], 66 y.o.   MRN: [id number]  HPI  Karen [name] is here in follow up of her TBI. She has been busy taking care of her husband and keeping up the house, running errands, etc. She is doing it essentially on her own. She often feels very tired as a result but deals with things the best she can. Her husband can be home alone by himself a few hours at a time.   She feels that her cognition has improved quite a bit but still has some issues with recall and attention, sometimes word finding  She hasn't had much time for leisure activity given her responsibilities.   Pain Inventory Average Pain 0 Pain Right Now 0 My pain is  No pain  In the last 24 hours, has pain interfered with the following? General activity 0 Relation with others 0 Enjoyment of life 0 What TIME of day is your pain at its worst? varies Sleep (in general) Fair  Pain is worse with:  No pain Pain improves with:  No pain Relief from Meds:  No pain  Family History  Problem Relation Age of Onset   Kidney disease Mother    Heart attack Father    Kidney disease Father    Diabetes Sister    Hyperlipidemia Sister    Hypertension Sister    Kidney disease Sister        x2   Social History   Socioeconomic History   Marital status: Married    Spouse name: Not on file   Number of children: 2   Years of education: Not on file   Highest education level: Not on file  Occupational History   Occupation: SHERIFF'S OFFICE    Employer: [employer]  Tobacco Use   Smoking status: Never   Smokeless tobacco: Never  Vaping Use   Vaping Use: Never used  Substance and Sexual Activity   Alcohol use: No   Drug use: No   Sexual activity: Not on file    Comment: Hysterectomy  Other Topics Concern   Not on file  Social History Narrative   Not on file   Social Determinants of Health   Financial Resource Strain: Not on file  Food Insecurity:  Not on file  Transportation Needs: Not on file  Physical Activity: Not on file  Stress: Not on file  Social Connections: Not on file   Past Surgical History:  Procedure Laterality Date   A/V FISTULAGRAM Right 04/22/2019   Procedure: A/V FISTULAGRAM - Right Upper;  Surgeon: Serafina Mitchell, MD;  Location: [place] [facility];  Service: Cardiovascular;  Laterality: Right;   A/V FISTULAGRAM N/A 02/17/2020   Procedure: A/V FISTULAGRAM - Right Upper;  Surgeon: Serafina Mitchell, MD;  Location: [place] [facility];  Service: Cardiovascular;  Laterality: N/A;   A/V FISTULAGRAM Right 11/23/2020   Procedure: A/V FISTULAGRAM;  Surgeon: Serafina Mitchell, MD;  Location: [place] [facility];  Service: Cardiovascular;  Laterality: Right;   ABDOMINAL HYSTERECTOMY  2005   AV FISTULA PLACEMENT Left 12/09/2013   Procedure: INSERTION OF ARTERIOVENOUS (AV) GORE-TEX GRAFT ARM;  Surgeon: Elam Dutch, MD;  Location: [facility] OR;  Service: Vascular;  Laterality: Left;   AV FISTULA PLACEMENT Right 01/01/2018   Procedure: INSERTION OF ARTERIOVENOUS (AV) GORE-TEX GRAFT ARM RIGHT ARM;  Surgeon: Elam Dutch, MD;  Location: [place];  Service: Vascular;  Laterality: Right;  AV FISTULA PLACEMENT Right 09/17/2018   Procedure: 1ST STAGE BASILIC ARTERIOVENOUS (AV) FISTULA CREATION RIGHT ARM;  Surgeon: Waynetta Sandy, MD;  Location: [place];  Service: Vascular;  Laterality: Right;   [place] REMOVAL Left 02/10/2021   Procedure: REMOVAL OF LEFT ARM DIALYSIS GRAFT AND BRACHIAL ARTERY PATCH ANGIOPLASTY;  Surgeon: Serafina Mitchell, MD;  Location: [place];  Service: Vascular;  Laterality: Left;   [place] Right 11/12/2018   Procedure: SECOND STAGE BASILIC VEIN TRANSPOSITION RIGHT ARM;  Surgeon: Waynetta Sandy, MD;  Location: [place];  Service: Vascular;  Laterality: Right;   BREAST BIOPSY  1990's   BUNIONECTOMY Bilateral    CHOLECYSTECTOMY  2005   COLONOSCOPY     CRANIOTOMY Left 08/02/2020   Procedure:  CRANIOTOMY HEMATOMA EVACUATION SUBDURAL;  Surgeon: Eustace Moore, MD;  Location: [place];  Service: Neurosurgery;  Laterality: Left;   FISTULOGRAM Right 06/12/2019   Procedure: Fistulogram of right upper arm arteriovenous fistula;  Surgeon: Serafina Mitchell, MD;  Location: [facility] OR;  Service: Vascular;  Laterality: Right;   INSERTION OF ILIAC STENT  06/12/2019   Procedure: Insertion Of right basilic vein Stent;  Surgeon: Serafina Mitchell, MD;  Location: [place];  Service: Vascular;;   IR AV DIALY SHUNT INTRO NEEDLE/INTRACATH INITIAL W/PTA/IMG RIGHT Right 03/04/2019   IR GENERIC HISTORICAL  01/11/2016   IR US GUIDE VASC ACCESS RIGHT 01/11/2016 Corrie Mckusick, DO [facility]   IR GENERIC HISTORICAL  01/11/2016   IR RADIOLOGY PERIPHERAL GUIDED IV START 01/11/2016 Corrie Mckusick, DO [facility]   IR IMAGING GUIDED PORT INSERTION  07/05/2017   IR REMOVAL TUN CV CATH W/O FL  08/12/2019   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   KNEE ARTHROSCOPY Bilateral    MASTECTOMY W/ SENTINEL NODE BIOPSY Right 11/20/2017   MASTECTOMY W/ SENTINEL NODE BIOPSY Right 11/20/2017   Procedure: RIGHT MASTECTOMY WITH SENTINEL LYMPH NODE BIOPSY;  Surgeon: Coralie Keens, MD;  Location: [place];  Service: General;  Laterality: Right;   PERIPHERAL VASCULAR BALLOON ANGIOPLASTY  02/17/2020   Procedure: PERIPHERAL VASCULAR BALLOON ANGIOPLASTY;  Surgeon: Serafina Mitchell, MD;  Location: [place] [facility];  Service: Cardiovascular;;   PERIPHERAL VASCULAR BALLOON ANGIOPLASTY  11/23/2020   Procedure: PERIPHERAL VASCULAR BALLOON ANGIOPLASTY;  Surgeon: Serafina Mitchell, MD;  Location: [place] [facility];  Service: Cardiovascular;;   PERIPHERAL VASCULAR CATHETERIZATION N/A 08/31/2015   Procedure: A/V Shuntogram;  Surgeon: Serafina Mitchell, MD;  Location: [place] [facility];  Service: Cardiovascular;  Laterality: N/A;   PERIPHERAL VASCULAR CATHETERIZATION Left 08/31/2015   Procedure: Peripheral Vascular Balloon Angioplasty;  Surgeon: Serafina Mitchell, MD;  Location: [place] [facility];  Service: Cardiovascular;  Laterality: Left;  arm fistula   PERIPHERAL VASCULAR INTERVENTION Right 04/22/2019   Procedure: PERIPHERAL VASCULAR INTERVENTION;  Surgeon: Serafina Mitchell, MD;  Location: [place] [facility];  Service: Cardiovascular;  Laterality: Right;  FISTULA   PORT-A-CATH REMOVAL N/A 08/12/2018   Procedure: PORT-A-CATH REMOVAL;  Surgeon: Coralie Keens, MD;  Location: [place];  Service: General;  Laterality: N/A;   [place] Right 12/24/2017   Procedure: UPPER EXTREMITY VENOGRAPHY CENTRAL VENOGRAM;  Surgeon: Waynetta Sandy, MD;  Location: [place] [facility];  Service: Cardiovascular;  Laterality: Right;   UPPER EXTREMITY VENOGRAPHY Bilateral 09/02/2018   Procedure: UPPER EXTREMITY VENOGRAPHY;  Surgeon: Waynetta Sandy, MD;  Location: [place] [facility];  Service: Cardiovascular;  Laterality: Bilateral;   Past Surgical History:  Procedure Laterality Date   A/V FISTULAGRAM Right 04/22/2019   Procedure: A/V FISTULAGRAM - Right Upper;  Surgeon: Serafina Mitchell, MD;  Location: [place] [facility];  Service: Cardiovascular;  Laterality: Right;   A/V FISTULAGRAM N/A 02/17/2020   Procedure: A/V FISTULAGRAM - Right Upper;  Surgeon: Serafina Mitchell, MD;  Location: [place] [facility];  Service: Cardiovascular;  Laterality: N/A;   A/V FISTULAGRAM Right 11/23/2020   Procedure: A/V FISTULAGRAM;  Surgeon: Serafina Mitchell, MD;  Location: [place] [facility];  Service: Cardiovascular;  Laterality: Right;   ABDOMINAL HYSTERECTOMY  2005   AV FISTULA PLACEMENT Left 12/09/2013   Procedure: INSERTION OF ARTERIOVENOUS (AV) GORE-TEX GRAFT ARM;  Surgeon: Elam Dutch, MD;  Location: [place];  Service: Vascular;  Laterality: Left;   AV FISTULA PLACEMENT Right 01/01/2018   Procedure: INSERTION OF ARTERIOVENOUS (AV) GORE-TEX GRAFT ARM RIGHT ARM;  Surgeon: Elam Dutch, MD;  Location: [place];   Service: Vascular;  Laterality: Right;   AV FISTULA PLACEMENT Right 09/17/2018   Procedure: 1ST STAGE BASILIC ARTERIOVENOUS (AV) FISTULA CREATION RIGHT ARM;  Surgeon: Waynetta Sandy, MD;  Location: [place];  Service: Vascular;  Laterality: Right;   [place] REMOVAL Left 02/10/2021   Procedure: REMOVAL OF LEFT ARM DIALYSIS GRAFT AND BRACHIAL ARTERY PATCH ANGIOPLASTY;  Surgeon: Serafina Mitchell, MD;  Location: [place];  Service: Vascular;  Laterality: Left;   [place] Right 11/12/2018   Procedure: SECOND STAGE BASILIC VEIN TRANSPOSITION RIGHT ARM;  Surgeon: Waynetta Sandy, MD;  Location: [place];  Service: Vascular;  Laterality: Right;   BREAST BIOPSY  1990's   BUNIONECTOMY Bilateral    CHOLECYSTECTOMY  2005   COLONOSCOPY     CRANIOTOMY Left 08/02/2020   Procedure: CRANIOTOMY HEMATOMA EVACUATION SUBDURAL;  Surgeon: Eustace Moore, MD;  Location: [place];  Service: Neurosurgery;  Laterality: Left;   FISTULOGRAM Right 06/12/2019   Procedure: Fistulogram of right upper arm arteriovenous fistula;  Surgeon: Serafina Mitchell, MD;  Location: [facility] OR;  Service: Vascular;  Laterality: Right;   INSERTION OF ILIAC STENT  06/12/2019   Procedure: Insertion Of right basilic vein Stent;  Surgeon: Serafina Mitchell, MD;  Location: [place];  Service: Vascular;;   IR AV DIALY SHUNT INTRO NEEDLE/INTRACATH INITIAL W/PTA/IMG RIGHT Right 03/04/2019   IR GENERIC HISTORICAL  01/11/2016   IR US GUIDE VASC ACCESS RIGHT 01/11/2016 Corrie Mckusick, DO [facility]   IR GENERIC HISTORICAL  01/11/2016   IR RADIOLOGY PERIPHERAL GUIDED IV START 01/11/2016 Corrie Mckusick, DO [facility]   IR IMAGING GUIDED PORT INSERTION  07/05/2017   IR REMOVAL TUN CV CATH W/O FL  08/12/2019   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   KNEE ARTHROSCOPY Bilateral    MASTECTOMY W/ SENTINEL NODE BIOPSY Right 11/20/2017   MASTECTOMY W/ SENTINEL NODE BIOPSY Right 11/20/2017   Procedure: RIGHT MASTECTOMY WITH SENTINEL LYMPH NODE  BIOPSY;  Surgeon: Coralie Keens, MD;  Location: [place];  Service: General;  Laterality: Right;   PERIPHERAL VASCULAR BALLOON ANGIOPLASTY  02/17/2020   Procedure: PERIPHERAL VASCULAR BALLOON ANGIOPLASTY;  Surgeon: Serafina Mitchell, MD;  Location: [place] [facility];  Service: Cardiovascular;;   PERIPHERAL VASCULAR BALLOON ANGIOPLASTY  11/23/2020   Procedure: PERIPHERAL VASCULAR BALLOON ANGIOPLASTY;  Surgeon: Serafina Mitchell, MD;  Location: [place] [facility];  Service: Cardiovascular;;   PERIPHERAL VASCULAR CATHETERIZATION N/A 08/31/2015   Procedure: A/V Shuntogram;  Surgeon: Serafina Mitchell, MD;  Location: [place] [facility];  Service: Cardiovascular;  Laterality: N/A;   PERIPHERAL VASCULAR CATHETERIZATION Left 08/31/2015   Procedure: Peripheral Vascular Balloon Angioplasty;  Surgeon: Serafina Mitchell, MD;  Location: [place] [facility];  Service: Cardiovascular;  Laterality: Left;  arm fistula   PERIPHERAL VASCULAR INTERVENTION Right 04/22/2019   Procedure: PERIPHERAL VASCULAR INTERVENTION;  Surgeon: Serafina Mitchell, MD;  Location: [place] [facility];  Service: Cardiovascular;  Laterality: Right;  FISTULA   PORT-A-CATH REMOVAL N/A 08/12/2018   Procedure: PORT-A-CATH REMOVAL;  Surgeon: Coralie Keens, MD;  Location: [place];  Service: General;  Laterality: N/A;   [place] Right 12/24/2017   Procedure: UPPER EXTREMITY VENOGRAPHY CENTRAL VENOGRAM;  Surgeon: Waynetta Sandy, MD;  Location: [place] [facility];  Service: Cardiovascular;  Laterality: Right;   UPPER EXTREMITY VENOGRAPHY Bilateral 09/02/2018   Procedure: UPPER EXTREMITY VENOGRAPHY;  Surgeon: Waynetta Sandy, MD;  Location: [place] [facility];  Service: Cardiovascular;  Laterality: Bilateral;   Past Medical History:  Diagnosis Date   Anemia    Arthritis    knees   Breast cancer ([place]) 2019   Right Breast Cancer   Cancer [facility])    Chest pain Jan 2016   low risk Myoview     CHF (congestive heart failure) ([place])    Colitis 12/08/2014   [facility]- focal moderate active colitis   Complication of anesthesia 2005   difficulty remembering for a while and waking up   Constipation    Dysrhythmia    h/o A-Fib   Elevated LFTs 2016   [facility]   ESRD on dialysis [facility]) April 2016   [facility] [place]   Fundic gland polyps of stomach, benign    GERD (gastroesophageal reflux disease)    Heart murmur Nov 2015   Aortic scleosis- no stenosis   Hemodialysis patient ([place])    Hypertension    Shortness of breath dyspnea    with exertion   BP 131/83    Pulse 82    Ht 5\' 1"  (1.549 m)    Wt 177 lb 3.2 oz (80.4 kg)    LMP  (LMP Unknown)    SpO2 99%    BMI 33.48 kg/m   Opioid Risk Score:   Fall Risk Score:  `1  Depression screen PHQ 2/9  Depression screen [facility] 2/9 03/02/2021 11/03/2020 09/09/2020  Decreased Interest 0 0 1  Down, Depressed, Hopeless 0 0 0  PHQ - 2 Score 0 0 1  Altered sleeping - - 1  Tired, decreased energy - - 1  Change in appetite - - 0  Feeling bad or failure about yourself  - - 0  Trouble concentrating - - 1  Moving slowly or fidgety/restless - - 0  Suicidal thoughts - - 0  PHQ-9 Score - - 4  Some recent data might be hidden     Review of Systems  Constitutional: Negative.   HENT: Negative.    Eyes: Negative.   Respiratory: Negative.    Cardiovascular: Negative.   Gastrointestinal: Negative.   Endocrine: Negative.   Genitourinary: Negative.   Musculoskeletal: Negative.   Skin: Negative.   Allergic/Immunologic: Negative.   Neurological: Negative.   Hematological: Negative.   Psychiatric/Behavioral: Negative.        Objective:   Physical Exam General: No acute distress HEENT: NCAT, EOMI, oral membranes moist Cards: reg rate  Chest: normal effort Abdomen: Soft, NT,  ND Skin: dry, intact Extremities: no edema Psych: pleasant and appropriate  Skin: intact Neuro: Alert and oriented x 3. Normal insight and awareness. Intact  Memory. Normal language and speech. Cranial nerve exam unremarkable. Strength is 5 out of 5.  Sensory exam is normal.  Romberg test is negative.  She ambulated without any issues for me today. Musculoskeletal: Full ROM, No pain with AROM or PROM in the neck, trunk, or extremities. Posture appropriate             Medical Problem List and Plan: 1.  Decreased functional mobility with gait disturbance secondary to traumatic left SDH with 4 mm left to right midline shift as well as small right subdural hematoma status post left parietal craniotomy evacuation of subdural hematoma 08/02/2020.                -recommended finding some time for leisure and rest.  -need prioritize sleep 2.  I  Pain Management/post-traumatic headache:               -tylenol prn 3.   Atrial fibrillation/chest pain.  per cards   4.  GERD.  hold Protonix for now given loose stool 5.  End-stage renal disease/hyperkalemia.  Hemodialysis per renal service.    6.  Diastolic congestive heart failure.   Per pcp/cards 7. Loose stooL: related to protonix and abx--resolved  -probiotic   15 minutes of face to face patient care time were spent during this visit. All questions were encouraged and answered.  Follow up with me prn.

## 2021-03-02 NOTE — Patient Instructions (Addendum)
PLEASE FEEL FREE TO CALL OUR OFFICE WITH ANY PROBLEMS OR QUESTIONS ([phone])   PROBIOTICS  ?CAPSULE FORM

## 2021-03-09 ENCOUNTER — Other Ambulatory Visit: Payer: Self-pay | Admitting: Cardiovascular Disease

## 2021-04-17 ENCOUNTER — Other Ambulatory Visit: Payer: Self-pay | Admitting: Physical Medicine and Rehabilitation

## 2021-06-22 ENCOUNTER — Telehealth: Payer: Self-pay

## 2021-06-22 NOTE — Telephone Encounter (Signed)
Pt called with c/o "white spots" forming at AVF site where they are unable to stick her. This discoloration was noticed about a month ago. HD center advised her to call us to be seen. Pt has been scheduled later this month with APP.

## 2021-06-27 ENCOUNTER — Other Ambulatory Visit: Payer: Self-pay | Admitting: Family Medicine

## 2021-06-27 DIAGNOSIS — Z1231 Encounter for screening mammogram for malignant neoplasm of breast: Secondary | ICD-10-CM

## 2021-07-12 ENCOUNTER — Ambulatory Visit (INDEPENDENT_AMBULATORY_CARE_PROVIDER_SITE_OTHER): Payer: Medicare Other | Admitting: Physician Assistant

## 2021-07-12 VITALS — BP 106/72 | HR 79 | Temp 97.3°F | Resp 18 | Ht 61.0 in | Wt 180.2 lb

## 2021-07-12 DIAGNOSIS — N186 End stage renal disease: Secondary | ICD-10-CM | POA: Diagnosis not present

## 2021-07-12 DIAGNOSIS — Z992 Dependence on renal dialysis: Secondary | ICD-10-CM | POA: Diagnosis not present

## 2021-07-15 ENCOUNTER — Encounter: Payer: Self-pay | Admitting: Cardiovascular Disease

## 2021-07-15 ENCOUNTER — Ambulatory Visit (INDEPENDENT_AMBULATORY_CARE_PROVIDER_SITE_OTHER): Payer: Medicare Other | Admitting: Cardiovascular Disease

## 2021-07-15 DIAGNOSIS — I480 Paroxysmal atrial fibrillation: Secondary | ICD-10-CM

## 2021-07-15 DIAGNOSIS — E782 Mixed hyperlipidemia: Secondary | ICD-10-CM | POA: Diagnosis not present

## 2021-07-15 NOTE — Assessment & Plan Note (Signed)
History of PAF in the past but none in the last several years maintaining sinus rhythm

## 2021-07-15 NOTE — Assessment & Plan Note (Signed)
History of hyperlipidemia on statin therapy with lipid profile performed 01/17/2018 revealing total cholesterol 185, LDL 115 and HDL 48.  I am going to repeat a lipid liver profile.

## 2021-07-15 NOTE — Progress Notes (Deleted)
07/15/2021 [name]   05-20-1955  [id number]  Primary Physician Lucianne Lei, MD Primary Cardiologist: Lorretta Harp MD Garret Reddish, [place], [place]  HPI:  Karen [name] is a 66 y.o. female ***   Current Meds  Medication Sig   amoxicillin (AMOXIL) 500 MG tablet Take 2,000 mg by mouth See admin instructions. Take 2000 mg 1 hour prior to dental work   atorvastatin (LIPITOR) 10 MG tablet TAKE 1 TABLET BY MOUTH EVERY DAY IN THE EVENING   cephALEXin (KEFLEX) 500 MG capsule Take 1 capsule (500 mg total) by mouth 2 (two) times daily.   Cinacalcet HCl (SENSIPAR PO) Take 1 tablet by mouth every Monday, Wednesday, and Friday.   clobetasol cream (TEMOVATE) 2.95 % Apply 1 application topically daily as needed (for rash).    diltiazem (CARDIZEM CD) 120 MG 24 hr capsule TAKE 1 CAPSULE (120 MG TOTAL) BY MOUTH EVERY TUESDAY, THURSDAY, SATURDAY, AND SUNDAY AT 6 PM.   diltiazem (CARDIZEM) 30 MG tablet Take 1 tablet (30 mg total) by mouth every Monday, Wednesday, and Friday.   Doxercalciferol (HECTOROL IV) Doxercalciferol (Hectorol)   ethyl chloride spray Apply 1 application topically every Monday, Wednesday, and Friday with hemodialysis.   fluticasone (FLONASE) 50 MCG/ACT nasal spray Place 1 spray into both nostrils daily as needed for allergies.   HYDROcodone-acetaminophen (NORCO) 5-325 MG tablet Take 1 tablet by mouth every 6 (six) hours as needed for moderate pain.   lidocaine-prilocaine (EMLA) cream Apply 1 application topically See admin instructions. Apply topically Monday, Wednesday and Friday before dialysis   Methoxy PEG-Epoetin Beta (MIRCERA IJ) Mircera   midodrine (PROAMATINE) 10 MG tablet Take 2 tablets (20 mg total) by mouth every Monday, Wednesday, and Friday. Prior to dialysis   multivitamin (RENA-VIT) TABS tablet Take 1 tablet by mouth daily.    ondansetron (ZOFRAN) 8 MG tablet Take 1 tablet (8 mg total) by mouth every 8 (eight) hours as needed for nausea or  vomiting.   sevelamer carbonate (RENVELA) 800 MG tablet Take 1 tablet (800 mg total) by mouth 3 (three) times daily with meals. (Patient taking differently: Take 1,600-2,400 mg by mouth See admin instructions. Take 2400 mg with each meal and 1600 mg with each snack)     No Known Allergies  Social History   Socioeconomic History   Marital status: Married    Spouse name: Not on file   Number of children: 2   Years of education: Not on file   Highest education level: Not on file  Occupational History   Occupation: [employer] OFFICE    Employer: [employer]  Tobacco Use   Smoking status: Never   Smokeless tobacco: Never  Vaping Use   Vaping Use: Never used  Substance and Sexual Activity   Alcohol use: No   Drug use: No   Sexual activity: Not on file    Comment: Hysterectomy  Other Topics Concern   Not on file  Social History Narrative   Not on file   Social Determinants of Health   Financial Resource Strain: Not on file  Food Insecurity: Not on file  Transportation Needs: Not on file  Physical Activity: Not on file  Stress: Not on file  Social Connections: Not on file  Intimate Partner Violence: Not on file     Review of Systems: General: negative for chills, fever, night sweats or weight changes.  Cardiovascular: negative for chest pain, dyspnea on exertion, edema, orthopnea, palpitations, paroxysmal nocturnal dyspnea or shortness of  breath Dermatological: negative for rash Respiratory: negative for cough or wheezing Urologic: negative for hematuria Abdominal: negative for nausea, vomiting, diarrhea, bright red blood per rectum, melena, or hematemesis Neurologic: negative for visual changes, syncope, or dizziness All other systems reviewed and are otherwise negative except as noted above.    Blood pressure 99/66, pulse 81, height '5\' 1"'$  (1.549 m), weight 178 lb (80.7 kg).  {Physical JGGE:[id number]}  EKG ***  ASSESSMENT AND PLAN:   Paroxysmal atrial  fibrillation (HCC) History of PAF in the past but none in the last several years maintaining sinus rhythm  Hyperlipidemia History of hyperlipidemia on statin therapy with lipid profile performed 01/17/2018 revealing total cholesterol 185, LDL 115 and HDL 48.  I am going to repeat a lipid liver profile.     Lorretta Harp MD [place], [place] 07/15/2021 2:10 PM    07/15/2021 Keyanah Kozicki   07-25-1955  [id number]  Primary Physician Lucianne Lei, MD Primary Cardiologist: Lorretta Harp MD Lupe Carney, [place]  HPI:  Karen [name] is a 66 y.o.  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  I last saw her in the office 06/22/2020.  Her cardiac risk factor profile is notable for hypertension but otherwise are negative. For the last 3 weeks prior to her last office visit she had noticed substernal chest pain radiating to both upper extremities occurring on a daily basis. She does have reflux but she says the symptoms are different. I ordered a Myoview stress test which was entirely normal. Since I saw her in the office a year ago her chest pain has resolved. She has gone on hemodialysis one year ago. 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 She did have paroxysmal atrial fibrillation demonstrated in the emergency room after dialysis converting with IV diltiazem.    She was admitted to [facility] 01/07/2018 with atypical chest pain.  She ruled out for myocardial infarction.  Her d-dimer was mildly elevated and a CTA was negative for PE.  She was seen by Dr. 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  He did hear a rub.  Her diltiazem was discontinued as was her aspirin.   She had a Myoview stress test performed 05/01/2019 was nonischemic and an event monitor showed present predominantly sinus rhythm with short runs of  PSVT.  She did have a drug-coated stent placed in her fistula recently by Dr. Trula Slade.   Since I saw her a year ago she denies chest pain or shortness of breath.  Dr. 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  She continues with hemodialysis without complication.  She did she denies chest pain, shortness of breath or palpitations.   Current Meds  Medication Sig   amoxicillin (AMOXIL) 500 MG tablet Take 2,000 mg by mouth See admin instructions. Take 2000 mg 1 hour prior to dental work   atorvastatin (LIPITOR) 10 MG tablet TAKE 1 TABLET BY MOUTH EVERY DAY IN THE EVENING   cephALEXin (KEFLEX) 500 MG capsule Take 1 capsule (500 mg total) by mouth 2 (two) times daily.   Cinacalcet HCl (SENSIPAR PO) Take 1 tablet by mouth every Monday, Wednesday, and Friday.   clobetasol cream (TEMOVATE) 6.56 % Apply 1 application topically daily as needed (for rash).    diltiazem (CARDIZEM CD) 120 MG 24 hr capsule TAKE 1 CAPSULE (120 MG TOTAL) BY MOUTH EVERY TUESDAY,  THURSDAY, SATURDAY, AND SUNDAY AT 6 PM.   diltiazem (CARDIZEM) 30 MG tablet Take 1 tablet (30 mg total) by mouth every Monday, Wednesday, and Friday.   Doxercalciferol (HECTOROL IV) Doxercalciferol (Hectorol)   ethyl chloride spray Apply 1 application topically every Monday, Wednesday, and Friday with hemodialysis.   fluticasone (FLONASE) 50 MCG/ACT nasal spray Place 1 spray into both nostrils daily as needed for allergies.   HYDROcodone-acetaminophen (NORCO) 5-325 MG tablet Take 1 tablet by mouth every 6 (six) hours as needed for moderate pain.   lidocaine-prilocaine (EMLA) cream Apply 1 application topically See admin instructions. Apply topically Monday, Wednesday and Friday before dialysis   Methoxy PEG-Epoetin Beta (MIRCERA IJ) Mircera   midodrine (PROAMATINE) 10 MG tablet Take 2 tablets (20 mg total) by mouth every Monday, Wednesday, and Friday. Prior to dialysis   multivitamin (RENA-VIT) TABS tablet Take 1 tablet by mouth daily.    ondansetron  (ZOFRAN) 8 MG tablet Take 1 tablet (8 mg total) by mouth every 8 (eight) hours as needed for nausea or vomiting.   sevelamer carbonate (RENVELA) 800 MG tablet Take 1 tablet (800 mg total) by mouth 3 (three) times daily with meals. (Patient taking differently: Take 1,600-2,400 mg by mouth See admin instructions. Take 2400 mg with each meal and 1600 mg with each snack)     No Known Allergies  Social History   Socioeconomic History   Marital status: Married    Spouse name: Not on file   Number of children: 2   Years of education: Not on file   Highest education level: Not on file  Occupational History   Occupation: [employer]    Employer: [employer]  Tobacco Use   Smoking status: Never   Smokeless tobacco: Never  Vaping Use   Vaping Use: Never used  Substance and Sexual Activity   Alcohol use: No   Drug use: No   Sexual activity: Not on file    Comment: Hysterectomy  Other Topics Concern   Not on file  Social History Narrative   Not on file   Social Determinants of Health   Financial Resource Strain: Not on file  Food Insecurity: Not on file  Transportation Needs: Not on file  Physical Activity: Not on file  Stress: Not on file  Social Connections: Not on file  Intimate Partner Violence: Not on file     Review of Systems: General: negative for chills, fever, night sweats or weight changes.  Cardiovascular: negative for chest pain, dyspnea on exertion, edema, orthopnea, palpitations, paroxysmal nocturnal dyspnea or shortness of breath Dermatological: negative for rash Respiratory: negative for cough or wheezing Urologic: negative for hematuria Abdominal: negative for nausea, vomiting, diarrhea, bright red blood per rectum, melena, or hematemesis Neurologic: negative for visual changes, syncope, or dizziness All other systems reviewed and are otherwise negative except as noted above.    Blood pressure 99/66, pulse 81, height '5\' 1"'$  (1.549 m), weight 178 lb  (80.7 kg).  General appearance: alert and no distress Neck: no adenopathy, no carotid bruit, no JVD, supple, symmetrical, trachea midline, and thyroid not enlarged, symmetric, no tenderness/mass/nodules Lungs: clear to auscultation bilaterally Heart: regular rate and rhythm, S1, S2 normal, no murmur, click, rub or gallop Extremities: extremities normal, atraumatic, no cyanosis or edema Pulses: 2+ and symmetric Skin: Skin color, texture, turgor normal. No rashes or lesions Neurologic: Grossly normal  EKG sinus rhythm at 81 without ST or T wave changes.  I personally reviewed this EKG.  ASSESSMENT AND PLAN:  Paroxysmal atrial fibrillation (HCC) History of PAF in the past but none in the last several years maintaining sinus rhythm  Hyperlipidemia History of hyperlipidemia on statin therapy with lipid profile performed 01/17/2018 revealing total cholesterol 185, LDL 115 and HDL 48.  I am going to repeat a lipid liver profile.     Lorretta Harp MD FACP,FACC,FAHA, [place] 07/15/2021 2:10 PM

## 2021-07-15 NOTE — Patient Instructions (Signed)
Medication Instructions:  Your physician recommends that you continue on your current medications as directed. Please refer to the Current Medication list given to you today.  *If you need a refill on your cardiac medications before your next appointment, please call your pharmacy*   Lab Work: Your physician recommends that you return for lab work in: the next week or 2 for FASTING lipid/liver profile  If you have labs (blood work) drawn today and your tests are completely normal, you will receive your results only by: [place] (if you have MyChart) OR A paper copy in the mail If you have any lab test that is abnormal or we need to change your treatment, we will call you to review the results.   Follow-Up: At [facility], you and your health needs are our priority.  As part of our continuing mission to provide you with exceptional heart care, we have created designated Provider Care Teams.  These Care Teams include your primary Cardiologist (physician) and Advanced Practice Providers (APPs -  Physician Assistants and Nurse Practitioners) who all work together to provide you with the care you need, when you need it.  We recommend signing up for the patient portal called "MyChart".  Sign up information is provided on this After Visit Summary.  MyChart is used to connect with patients for Virtual Visits (Telemedicine).  Patients are able to view lab/test results, encounter notes, upcoming appointments, etc.  Non-urgent messages can be sent to your provider as well.   To learn more about what you can do with MyChart, go to [email or web address].    Your next appointment:   12 month(s)  The format for your next appointment:   In Person  Provider:   Quay Burow, MD

## 2021-07-15 NOTE — Progress Notes (Signed)
07/15/2021 [facility]   1955/07/12  [id number]  Primary Physician Lucianne Lei, MD Primary Cardiologist: Lorretta Harp MD Lupe Carney, [place]  HPI:  Karen [name] is a 66 y.o.   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  I last saw her in the office 06/22/2020.  Her cardiac risk factor profile is notable for hypertension but otherwise are negative. For the last 3 weeks prior to her last office visit she had noticed substernal chest pain radiating to both upper extremities occurring on a daily basis. She does have reflux but she says the symptoms are different. I ordered a Myoview stress test which was entirely normal. Since I saw her in the office a year ago her chest pain has resolved. She has gone on hemodialysis one year ago. 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 She did have paroxysmal atrial fibrillation demonstrated in the emergency room after dialysis converting with IV diltiazem.    She was admitted to [facility] 01/07/2018 with atypical chest pain.  She ruled out for myocardial infarction.  Her d-dimer was mildly elevated and a CTA was negative for PE.  She was seen by Dr. 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  He did hear a rub.  Her diltiazem was discontinued as was her aspirin.   She had a Myoview stress test performed 05/01/2019 was nonischemic and an event monitor showed present predominantly sinus rhythm with short runs of PSVT.  She did have a drug-coated stent placed in her fistula recently by Dr. Trula Slade.   Since I saw her a year ago she denies chest pain or shortness of breath.  Dr. 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  She continues with hemodialysis without complication.  She did she denies chest pain, shortness of breath or palpitations.     Current Meds  Medication Sig    amoxicillin (AMOXIL) 500 MG tablet Take 2,000 mg by mouth See admin instructions. Take 2000 mg 1 hour prior to dental work   atorvastatin (LIPITOR) 10 MG tablet TAKE 1 TABLET BY MOUTH EVERY DAY IN THE EVENING   cephALEXin (KEFLEX) 500 MG capsule Take 1 capsule (500 mg total) by mouth 2 (two) times daily.   Cinacalcet HCl (SENSIPAR PO) Take 1 tablet by mouth every Monday, Wednesday, and Friday.   clobetasol cream (TEMOVATE) 0.16 % Apply 1 application topically daily as needed (for rash).    diltiazem (CARDIZEM CD) 120 MG 24 hr capsule TAKE 1 CAPSULE (120 MG TOTAL) BY MOUTH EVERY TUESDAY, THURSDAY, SATURDAY, AND SUNDAY AT 6 PM.   diltiazem (CARDIZEM) 30 MG tablet Take 1 tablet (30 mg total) by mouth every Monday, Wednesday, and Friday.   Doxercalciferol (HECTOROL IV) Doxercalciferol (Hectorol)   ethyl chloride spray Apply 1 application topically every Monday, Wednesday, and Friday with hemodialysis.   fluticasone (FLONASE) 50 MCG/ACT nasal spray Place 1 spray into both nostrils daily as needed for allergies.   HYDROcodone-acetaminophen (NORCO) 5-325 MG tablet Take 1 tablet by mouth every 6 (six) hours as needed for moderate pain.   lidocaine-prilocaine (EMLA) cream Apply 1 application topically See admin instructions. Apply topically Monday, Wednesday and Friday before dialysis   Methoxy PEG-Epoetin Beta (MIRCERA IJ) Mircera   midodrine (PROAMATINE) 10 MG tablet Take 2 tablets (20 mg total) by mouth every Monday, Wednesday, and Friday. Prior to dialysis  multivitamin (RENA-VIT) TABS tablet Take 1 tablet by mouth daily.    ondansetron (ZOFRAN) 8 MG tablet Take 1 tablet (8 mg total) by mouth every 8 (eight) hours as needed for nausea or vomiting.   sevelamer carbonate (RENVELA) 800 MG tablet Take 1 tablet (800 mg total) by mouth 3 (three) times daily with meals. (Patient taking differently: Take 1,600-2,400 mg by mouth See admin instructions. Take 2400 mg with each meal and 1600 mg with each snack)      No Known Allergies  Social History   Socioeconomic History   Marital status: Married    Spouse name: Not on file   Number of children: 2   Years of education: Not on file   Highest education level: Not on file  Occupational History   Occupation: [employer]ICE    Employer: [employer]  Tobacco Use   Smoking status: Never   Smokeless tobacco: Never  Vaping Use   Vaping Use: Never used  Substance and Sexual Activity   Alcohol use: No   Drug use: No   Sexual activity: Not on file    Comment: Hysterectomy  Other Topics Concern   Not on file  Social History Narrative   Not on file   Social Determinants of Health   Financial Resource Strain: Not on file  Food Insecurity: Not on file  Transportation Needs: Not on file  Physical Activity: Not on file  Stress: Not on file  Social Connections: Not on file  Intimate Partner Violence: Not on file     Review of Systems: General: negative for chills, fever, night sweats or weight changes.  Cardiovascular: negative for chest pain, dyspnea on exertion, edema, orthopnea, palpitations, paroxysmal nocturnal dyspnea or shortness of breath Dermatological: negative for rash Respiratory: negative for cough or wheezing Urologic: negative for hematuria Abdominal: negative for nausea, vomiting, diarrhea, bright red blood per rectum, melena, or hematemesis Neurologic: negative for visual changes, syncope, or dizziness All other systems reviewed and are otherwise negative except as noted above.    Blood pressure 99/66, pulse 81, height '5\' 1"'$  (1.549 m), weight 178 lb (80.7 kg).  General appearance: alert and no distress Neck: no adenopathy, no carotid bruit, no JVD, supple, symmetrical, trachea midline, and thyroid not enlarged, symmetric, no tenderness/mass/nodules Lungs: clear to auscultation bilaterally Heart: regular rate and rhythm, S1, S2 normal, no murmur, click, rub or gallop Extremities: extremities normal, atraumatic,  no cyanosis or edema Pulses: 2+ and symmetric Skin: Skin color, texture, turgor normal. No rashes or lesions Neurologic: Grossly normal  EKG normal sinus rhythm at 81 without ST or T wave changes.  There was lateral Q waves noted.  I personally reviewed this EKG.  ASSESSMENT AND PLAN:   Paroxysmal atrial fibrillation (HCC) History of PAF in the past but none in the last several years maintaining sinus rhythm  Hyperlipidemia History of hyperlipidemia on statin therapy with lipid profile performed 01/17/2018 revealing total cholesterol 185, LDL 115 and HDL 48.  I am going to repeat a lipid liver profile.     Lorretta Harp MD FACP,FACC,FAHA, [facility] 07/15/2021 2:16 PM

## 2021-07-16 ENCOUNTER — Other Ambulatory Visit: Payer: Self-pay | Admitting: Nurse Practitioner

## 2021-07-21 ENCOUNTER — Ambulatory Visit
Admission: RE | Admit: 2021-07-21 | Discharge: 2021-07-21 | Disposition: A | Discharge status: Home / Self Care | Payer: Medicare Other | Source: Ambulatory Visit | Attending: Family Medicine | Admitting: Family Medicine

## 2021-07-21 DIAGNOSIS — Z1231 Encounter for screening mammogram for malignant neoplasm of breast: Secondary | ICD-10-CM

## 2021-07-25 ENCOUNTER — Other Ambulatory Visit: Payer: Self-pay | Admitting: Family Medicine

## 2021-07-25 DIAGNOSIS — R928 Other abnormal and inconclusive findings on diagnostic imaging of breast: Secondary | ICD-10-CM

## 2021-07-29 LAB — LIPID PANEL
Chol/HDL Ratio: 2.4 ratio (ref 0.0–4.4)
Cholesterol, Total: 142 mg/dL (ref 100–199)
HDL: 58 mg/dL (ref >39)
LDL Chol Calc (NIH): 69 mg/dL (ref 0–99)
Triglycerides: 77 mg/dL (ref 0–149)
VLDL Cholesterol Cal: 15 mg/dL (ref 5–40)

## 2021-07-29 LAB — HEPATIC FUNCTION PANEL
ALT: 9 IU/L (ref 0–32)
AST: 15 IU/L (ref 0–40)
Albumin: 4.4 g/dL (ref 3.9–4.9)
Alkaline Phosphatase: 206 IU/L — ABNORMAL HIGH (ref 44–121)
Bilirubin Total: 0.4 mg/dL (ref 0.0–1.2)
Bilirubin, Direct: 0.12 mg/dL (ref 0.00–0.40)
Total Protein: 7.2 g/dL (ref 6.0–8.5)

## 2021-08-02 ENCOUNTER — Other Ambulatory Visit: Payer: Self-pay

## 2021-08-02 DIAGNOSIS — R748 Abnormal levels of other serum enzymes: Secondary | ICD-10-CM

## 2021-08-02 DIAGNOSIS — Z992 Dependence on renal dialysis: Secondary | ICD-10-CM

## 2021-08-02 DIAGNOSIS — I480 Paroxysmal atrial fibrillation: Secondary | ICD-10-CM

## 2021-08-02 DIAGNOSIS — I10 Essential (primary) hypertension: Secondary | ICD-10-CM

## 2021-08-02 DIAGNOSIS — E782 Mixed hyperlipidemia: Secondary | ICD-10-CM

## 2021-08-04 ENCOUNTER — Other Ambulatory Visit: Payer: Self-pay | Admitting: Family Medicine

## 2021-08-04 ENCOUNTER — Ambulatory Visit
Admission: RE | Admit: 2021-08-04 | Discharge: 2021-08-04 | Disposition: A | Discharge status: Home / Self Care | Payer: Medicare Other | Source: Ambulatory Visit | Attending: Family Medicine | Admitting: Family Medicine

## 2021-08-04 DIAGNOSIS — N6489 Other specified disorders of breast: Secondary | ICD-10-CM

## 2021-08-04 DIAGNOSIS — R928 Other abnormal and inconclusive findings on diagnostic imaging of breast: Secondary | ICD-10-CM

## 2021-09-09 ENCOUNTER — Other Ambulatory Visit: Payer: Self-pay | Admitting: Cardiovascular Disease

## 2021-09-13 ENCOUNTER — Ambulatory Visit (HOSPITAL_COMMUNITY): Payer: Medicare Other | Admitting: Physician Assistant

## 2021-09-13 ENCOUNTER — Encounter (HOSPITAL_COMMUNITY): Payer: Self-pay

## 2021-09-15 ENCOUNTER — Other Ambulatory Visit: Payer: Self-pay | Admitting: Cardiovascular Disease

## 2021-09-27 ENCOUNTER — Encounter (HOSPITAL_COMMUNITY): Payer: Self-pay | Admitting: Nurse Practitioner

## 2021-09-27 ENCOUNTER — Ambulatory Visit (HOSPITAL_COMMUNITY)
Admission: RE | Admit: 2021-09-27 | Discharge: 2021-09-27 | Disposition: A | Discharge status: Home / Self Care | Payer: Medicare Other | Source: Ambulatory Visit | Attending: Nurse Practitioner | Admitting: Nurse Practitioner

## 2021-09-27 VITALS — BP 94/66 | HR 81 | Ht 61.0 in | Wt 181.2 lb

## 2021-09-27 DIAGNOSIS — Z992 Dependence on renal dialysis: Secondary | ICD-10-CM | POA: Insufficient documentation

## 2021-09-27 DIAGNOSIS — D6869 Other thrombophilia: Secondary | ICD-10-CM | POA: Diagnosis not present

## 2021-09-27 DIAGNOSIS — N186 End stage renal disease: Secondary | ICD-10-CM | POA: Diagnosis not present

## 2021-09-27 DIAGNOSIS — X58XXXS Exposure to other specified factors, sequela: Secondary | ICD-10-CM | POA: Insufficient documentation

## 2021-09-27 DIAGNOSIS — I120 Hypertensive chronic kidney disease with stage 5 chronic kidney disease or end stage renal disease: Secondary | ICD-10-CM | POA: Diagnosis not present

## 2021-09-27 DIAGNOSIS — S065XAS Traumatic subdural hemorrhage with loss of consciousness status unknown, sequela: Secondary | ICD-10-CM | POA: Insufficient documentation

## 2021-09-27 DIAGNOSIS — C50911 Malignant neoplasm of unspecified site of right female breast: Secondary | ICD-10-CM | POA: Insufficient documentation

## 2021-09-27 DIAGNOSIS — I480 Paroxysmal atrial fibrillation: Secondary | ICD-10-CM | POA: Diagnosis present

## 2021-09-27 DIAGNOSIS — I959 Hypotension, unspecified: Secondary | ICD-10-CM | POA: Diagnosis not present

## 2021-09-27 DIAGNOSIS — Z7982 Long term (current) use of aspirin: Secondary | ICD-10-CM | POA: Insufficient documentation

## 2021-09-27 DIAGNOSIS — Z7901 Long term (current) use of anticoagulants: Secondary | ICD-10-CM | POA: Insufficient documentation

## 2021-09-27 NOTE — Progress Notes (Signed)
Primary Care Physician: Lucianne Lei, MD Referring Physician: [facility] ER f/u Cardiologist: Dr. Mattie Marlin Karen [name] is a 66 y.o. female with a h/o ESRD on dialysis, failed transplant in 2016, HTN in the past but not treated for HTN currently,that has been having a  spells of palpitations during dialysis. Thuis is mentioned in her chart back to 2017. She was seen in the ED 11/24/15 and 12 /14/18 for same. She converted in the ER after just a few mins after arrival, ekg appeared to be be flutter. Ekg seen form 11/24/2015 flutter vrs fib. She is on ASA for a chadsvasc score of 1. She is on metoprolol 25 mg at hs only because her BP drops with dialysis if she takes her am dose. She has only had the arrhythmia following with dialysis.  F/u in afib clinic from ER, 12/20, she was given prn 30 mg Cardizem to use during dialysis and she did use once during dialysis but did not seem to help. Her nephrologist did mention that she could try 1/2 tab of 30 mg Cardizem prior to starting dialysis.I did talk to Dr. Rayann Heman and he is willing to consider primary ablation. She does have normal heart function by echo in 2017.   She saw Dr.Allred early this year and was offered ablation but she declined. 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 Now that she is post op several weeks, her BP's are running low. When she saw the kidney surgeon a few days ago, he mentioned for her to come here and get  her meds adjusted. During this time, she has not noted any afib, it has been quiet.  F/u afib clinic 5/9, on lat visit , metoprolol was adjusted as  well as Cardizem. She reports no afib and BP has been running much better, not so low.  Feels improved. She is currently happy with management.  F/u in afib clinic,  8/8, she unfortunately was diagnosed in June of this year with rt breast CA and is undergoing chemotherapy. She will be reassessed after chemo  for next step but pt thinks she will have a double mastectomy. She has not had any issues with afib.  F/U afib clinic 02/07/18. She is s/p right mastectomy, currently on chemotherapy. 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 She has not had any heart racing or palpitation symptoms. She continues to have issues with hypotension after dialysis.    F/u in afib clinic, 625/20. She is almost thru with chemo  for breast ca, 2 more treatments for which she is very happy about. She is not having any afib. She is having low blood pressure associated with dialysis. One of the nephrologists adjusted her dry weight this week to offset this. She is not symptomatic with her BP of 86/52 today. She is taking 30 mg of Cardizem after dialysis to ward off afib as she was having issues for awhile with afib right after dialysis.  F/u in afib clinic 05/15/19, at pt request. She started having intermittent mid chest discomfort with radiation into her throat  with a choking sensation   at will last several minutes at a time. She is very tired when the episode is over.  SHe  will have at least one of these spells daily.she  can have with rest or exertion. Not  associated with eating and will usually spontaneously  resolve. She saw Almyra Deforest,  [name], 4/2 with these symptoms. She  had a monitor and a stress test. The stress test was low risk. The monitor results are not back. She denies any irregularity to her heart rate or rhythm  with these episodes. She had a balloon to her rt arm fistula for low flow states 4/6 but had chest pain prior to that.   F/u in the afib clinic, 09/14/20. She was hospitalized 08/02/20 to 08/07/20 for a subdural hematoma s/p mechanical fall. She was trying to get a cobweb down and fell backward from the ladder.   Initial  w/u was negative for a bleed but then  pt developed an excruciating H/A in dialysis and was taken to the ER.  She was trying to get a cobweb down and fell backward from the ladder. She underwent a left sided craniology for an evacuation of the hematoma and tolerated the procedure well and d/c without complications. She was in rehab for around one week and has been home since then. She was not on asa or anticoagulation at the time of the fall.  She is still weak since the fall but gradually getting her strength back . She is not on anticoagulation for a CHA2DS2VASc  of 1. She is being seen back for refills on her Cardizem. She did not have afib with the incident and is in SR today.   F/u in afib clinic, 09/27/21. She reports that she has had a good year. Just 2-3 very short episodes of afib in august associated with family situation. None before or since.  She is not on anticoagulation for a CHA2DS2VASc  score of 2.     Today, she denies symptoms of palpitations, chest pain, shortness of breath, orthopnea, PND, lower extremity edema, dizziness, presyncope, syncope, or neurologic sequela. The patient is tolerating medications without difficulties and is otherwise without complaint today.   Past Medical History:  Diagnosis Date   Anemia    Arthritis    knees   Breast cancer ([place]) 2019   Right Breast Cancer   Cancer [facility])    Chest pain Jan 2016   low risk Myoview    CHF (congestive heart failure) ([place])    Colitis 12/08/2014   [facility]- focal moderate active colitis   Complication of anesthesia 2005   difficulty remembering for a while and waking up   Constipation    Dysrhythmia    h/o A-Fib   Elevated LFTs 2016   [facility]   ESRD on dialysis [facility]) April 2016   [facility] [place]    Fundic gland polyps of stomach, benign    GERD (gastroesophageal reflux disease)    Heart murmur Nov 2015   Aortic scleosis- no stenosis   Hemodialysis patient [facility])    Hypertension    Shortness  of breath dyspnea    with exertion   Past Surgical History:  Procedure Laterality Date   A/V FISTULAGRAM Right 04/22/2019   Procedure: A/V FISTULAGRAM - Right Upper;  Surgeon: Serafina Mitchell, MD;  Location: [place] CV LAB;  Service: Cardiovascular;  Laterality: Right;   A/V FISTULAGRAM N/A 02/17/2020   Procedure: A/V FISTULAGRAM - Right Upper;  Surgeon: Serafina Mitchell, MD;  Location: [place] CV LAB;  Service: Cardiovascular;  Laterality: N/A;   A/V FISTULAGRAM Right 11/23/2020   Procedure:  A/V FISTULAGRAM;  Surgeon: Serafina Mitchell, MD;  Location: [place] CV LAB;  Service: Cardiovascular;  Laterality: Right;   ABDOMINAL HYSTERECTOMY  2005   AV FISTULA PLACEMENT Left 12/09/2013   Procedure: INSERTION OF ARTERIOVENOUS (AV) GORE-TEX GRAFT ARM;  Surgeon: Elam Dutch, MD;  Location: [place];  Service: Vascular;  Laterality: Left;   AV FISTULA PLACEMENT Right 01/01/2018   Procedure: INSERTION OF ARTERIOVENOUS (AV) GORE-TEX GRAFT ARM RIGHT ARM;  Surgeon: Elam Dutch, MD;  Location: [place];  Service: Vascular;  Laterality: Right;   AV FISTULA PLACEMENT Right 09/17/2018   Procedure: 1ST STAGE BASILIC ARTERIOVENOUS (AV) FISTULA CREATION RIGHT ARM;  Surgeon: Waynetta Sandy, MD;  Location: [place];  Service: Vascular;  Laterality: Right;   [place] REMOVAL Left 02/10/2021   Procedure: REMOVAL OF LEFT ARM DIALYSIS GRAFT AND BRACHIAL ARTERY PATCH ANGIOPLASTY;  Surgeon: Serafina Mitchell, MD;  Location: [place];  Service: Vascular;  Laterality: Left;   [place] Right 11/12/2018   Procedure: SECOND STAGE BASILIC VEIN TRANSPOSITION RIGHT ARM;  Surgeon: Waynetta Sandy, MD;  Location: [place];  Service: Vascular;  Laterality: Right;   BREAST BIOPSY  1990's   BUNIONECTOMY Bilateral    CHOLECYSTECTOMY  2005   COLONOSCOPY     CRANIOTOMY Left 08/02/2020   Procedure: CRANIOTOMY HEMATOMA EVACUATION SUBDURAL;  Surgeon: Eustace Moore, MD;  Location: [place];  Service:  Neurosurgery;  Laterality: Left;   FISTULOGRAM Right 06/12/2019   Procedure: Fistulogram of right upper arm arteriovenous fistula;  Surgeon: Serafina Mitchell, MD;  Location: [facility];  Service: Vascular;  Laterality: Right;   INSERTION OF ILIAC STENT  06/12/2019   Procedure: Insertion Of right basilic vein Stent;  Surgeon: Serafina Mitchell, MD;  Location: [place];  Service: Vascular;;   IR AV DIALY SHUNT INTRO NEEDLE/INTRACATH INITIAL W/PTA/IMG RIGHT Right 03/04/2019   IR GENERIC HISTORICAL  01/11/2016   IR US GUIDE VASC ACCESS RIGHT 01/11/2016 Corrie Mckusick, DO [facility]-INTER[facility]   IR GENERIC HISTORICAL  01/11/2016   IR RADIOLOGY PERIPHERAL GUIDED IV START 01/11/2016 Corrie Mckusick, DO [facility]INTER[facility]   IR IMAGING GUIDED PORT INSERTION  07/05/2017   IR REMOVAL TUN CV CATH W/O FL  08/12/2019   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   KNEE ARTHROSCOPY Bilateral    MASTECTOMY W/ SENTINEL NODE BIOPSY Right 11/20/2017   MASTECTOMY W/ SENTINEL NODE BIOPSY Right 11/20/2017   Procedure: RIGHT MASTECTOMY WITH SENTINEL LYMPH NODE BIOPSY;  Surgeon: Coralie Keens, MD;  Location: [place];  Service: General;  Laterality: Right;   PERIPHERAL VASCULAR BALLOON ANGIOPLASTY  02/17/2020   Procedure: PERIPHERAL VASCULAR BALLOON ANGIOPLASTY;  Surgeon: Serafina Mitchell, MD;  Location: [place] CV LAB;  Service: Cardiovascular;;   PERIPHERAL VASCULAR BALLOON ANGIOPLASTY  11/23/2020   Procedure: PERIPHERAL VASCULAR BALLOON ANGIOPLASTY;  Surgeon: Serafina Mitchell, MD;  Location: [place] CV LAB;  Service: Cardiovascular;;   PERIPHERAL VASCULAR CATHETERIZATION N/A 08/31/2015   Procedure: A/V Shuntogram;  Surgeon: Serafina Mitchell, MD;  Location: [place] [facility];  Service: Cardiovascular;  Laterality: N/A;   PERIPHERAL VASCULAR CATHETERIZATION Left 08/31/2015   Procedure: Peripheral Vascular Balloon Angioplasty;  Surgeon: Serafina Mitchell, MD;  Location: [place] [facility];  Service: Cardiovascular;  Laterality: Left;  arm fistula    PERIPHERAL VASCULAR INTERVENTION Right 04/22/2019   Procedure: PERIPHERAL VASCULAR INTERVENTION;  Surgeon: Serafina Mitchell, MD;  Location: [place] [facility];  Service: Cardiovascular;  Laterality: Right;  FISTULA   PORT-A-CATH REMOVAL N/A 08/12/2018   Procedure: PORT-A-CATH REMOVAL;  Surgeon: Coralie Keens, MD;  Location: [place];  Service: General;  Laterality: N/A;   [place] Right 12/24/2017   Procedure: UPPER EXTREMITY VENOGRAPHY CENTRAL VENOGRAM;  Surgeon: Waynetta Sandy, MD;  Location: [place] [facility];  Service: Cardiovascular;  Laterality: Right;   UPPER EXTREMITY VENOGRAPHY Bilateral 09/02/2018   Procedure: UPPER EXTREMITY VENOGRAPHY;  Surgeon: Waynetta Sandy, MD;  Location: [place] CV [facility];  Service: Cardiovascular;  Laterality: Bilateral;    Current Outpatient Medications  Medication Sig Dispense Refill   amoxicillin (AMOXIL) 500 MG tablet Take 2,000 mg by mouth See admin instructions. Take 2000 mg 1 hour prior to dental work     atorvastatin (LIPITOR) 10 MG tablet TAKE 1 TABLET BY MOUTH EVERY DAY IN THE EVENING 90 tablet 3   clobetasol cream (TEMOVATE) 8.52 % Apply 1 application topically daily as needed (for rash).      diltiazem (CARDIZEM CD) 120 MG 24 hr capsule TAKE 1 CAPSULE (120 MG TOTAL) BY MOUTH EVERY TUESDAY, THURSDAY, SATURDAY, AND SUNDAY AT 6 PM. 30 capsule 11   diltiazem (CARDIZEM) 30 MG tablet TAKE 1 TABLET (30 MG TOTAL) BY MOUTH EVERY MONDAY, WEDNESDAY, AND FRIDAY. 36 tablet 7   ethyl chloride spray Apply 1 application topically every Monday, Wednesday, and Friday with hemodialysis.     fluticasone (FLONASE) 50 MCG/ACT nasal spray Place 1 spray into both nostrils daily as needed for allergies.     HYDROcodone-acetaminophen (NORCO) 5-325 MG tablet Take 1 tablet by mouth every 6 (six) hours as needed for moderate pain. 12 tablet 0   lidocaine-prilocaine (EMLA) cream Apply 1 application topically  See admin instructions. Apply topically Monday, Wednesday and Friday before dialysis  6   Methoxy PEG-Epoetin Beta (MIRCERA IJ) Mircera     midodrine (PROAMATINE) 10 MG tablet Take 2 tablets (20 mg total) by mouth every Monday, Wednesday, and Friday. Prior to dialysis 28 tablet 0   multivitamin (RENA-VIT) TABS tablet Take 1 tablet by mouth daily.      ondansetron (ZOFRAN) 8 MG tablet Take 1 tablet (8 mg total) by mouth every 8 (eight) hours as needed for nausea or vomiting. 20 tablet 0   prochlorperazine (COMPAZINE) 10 MG tablet Take 10 mg by mouth every 6 (six) hours as needed (Nausea or vomiting).     sevelamer carbonate (RENVELA) 800 MG tablet Take 1 tablet (800 mg total) by mouth 3 (three) times daily with meals. (Patient taking differently: Take 1,600-2,400 mg by mouth See admin instructions. Take 2400 mg with each meal and 1600 mg with each snack)     No current facility-administered medications for this encounter.    No Known Allergies  Social History   Socioeconomic History   Marital status: Married    Spouse name: Not on file   Number of children: 2   Years of education: Not on file   Highest education level: Not on file  Occupational History   Occupation: [employer] OFFICE    Employer: [employer]  Tobacco Use   Smoking status: Never   Smokeless tobacco: Never  Vaping Use   Vaping Use: Never used  Substance and Sexual Activity   Alcohol use: No   Drug use: No   Sexual activity: Not on file    Comment: Hysterectomy  Other Topics Concern   Not on file  Social History Narrative   Not on file  Social Determinants of Health   Financial Resource Strain: Not on file  Food Insecurity: Not on file  Transportation Needs: Not on file  Physical Activity: Not on file  Stress: Not on file  Social Connections: Not on file  Intimate Partner Violence: Not on file    Family History  Problem Relation Age of Onset   Kidney disease Mother    Heart attack Father     Kidney disease Father    Diabetes Sister    Hyperlipidemia Sister    Hypertension Sister    Kidney disease Sister        x2    ROS- All systems are reviewed and negative except as per the HPI above  Physical Exam: Vitals:   09/27/21 1331  BP: 94/66  Pulse: 81  Weight: 82.2 kg  Height: '5\' 1"'$  (1.549 m)   Wt Readings from Last 3 Encounters:  09/27/21 82.2 kg  07/15/21 80.7 kg  07/12/21 81.7 kg    Labs: Lab Results  Component Value Date   NA 138 02/10/2021   K 4.1 02/10/2021   CL 97 (L) 02/10/2021   CO2 28 08/20/2020   GLUCOSE 99 02/10/2021   BUN 24 (H) 02/10/2021   CREATININE 6.00 (H) 02/10/2021   CALCIUM 9.0 08/20/2020   PHOS 3.2 08/20/2020   MG 2.1 11/24/2015   Lab Results  Component Value Date   INR 1.1 08/02/2020   Lab Results  Component Value Date   CHOL 142 07/28/2021   HDL 58 07/28/2021   LDLCALC 69 07/28/2021   TRIG 77 07/28/2021     GEN- The patient is well appearing, alert and oriented x 3 today.   HEENT-head normocephalic, atraumatic, sclera clear, conjunctiva pink, hearing intact, trachea midline. Lungs- Clear to ausculation bilaterally, normal work of breathing Heart- Regular rate and rhythm, no murmurs, rubs or gallops  GI- soft, NT, ND, + BS Extremities- no clubbing, cyanosis, or edema MS- no significant deformity or atrophy Skin- no rash or lesion Psych- euthymic mood, full affect Neuro- strength and sensation are intact  Vent. rate 81 BPM PR interval 162 ms QRS duration 76 ms QT/QTcB 390/453 ms P-R-T axes 72 -29 31 Normal sinus rhythm Anterior infarct , age undetermined Abnormal ECG When compared with ECG of 14-Sep-2020 14:32, PREVIOUS ECG IS PRESENT Ekg-  Myoview-Study Highlights    The left ventricular ejection fraction is hyperdynamic (>65%). Nuclear stress EF: 72%. Blood pressure demonstrated a normal response to exercise. There was no ST segment deviation noted during stress. This is a low risk study.     Epic  records reviewed  Echo- 12/2017-Study Conclusions   - Left ventricle: The cavity size was normal. Systolic function was   normal. The estimated ejection fraction was in the range of 55%   to 60%. Wall motion was normal; there were no regional wall   motion abnormalities. Left ventricular diastolic function   parameters were normal. - Right atrium: Mobile calcified structur in RA not well visualized   may be calcified chiari network. - Atrial septum: No defect or patent foramen ovale was identified. - Pulmonary arteries: PA peak pressure: 33 mm Hg (S).     Assessment and Plan: 1. Paroxysmal afib  Has been very quiet  Pt has minimal afib to reports a few episodes in August, short lived.  Very quiet for some time She would only be a candidate for amiodarone as far as AAD's 2/2 ESRD Offered ablation in the past but she declined  For now,  anticoagulation not indicated for chadsvasc score of 2(female/age) Continue Diltiazem as taking for afib.   2. Hypotension Related to dialysis, not symptomatic Stable today   3. Breast CA Has finished chemo   4.  Subdural Hematoma 08/2020 2/2 to mechanical fall  S/p craniotomy and resolved   Return in one year, sooner if afib escalates  Butch Penny C. Bray Vickerman, [facility] [address] [place], [place] [address] [phone]

## 2021-11-10 ENCOUNTER — Other Ambulatory Visit (HOSPITAL_COMMUNITY): Payer: Self-pay | Admitting: Nurse Practitioner

## 2021-11-11 ENCOUNTER — Other Ambulatory Visit: Payer: Self-pay | Admitting: Cardiovascular Disease

## 2021-11-12 LAB — HEPATIC FUNCTION PANEL
ALT: 9 IU/L (ref 0–32)
AST: 14 IU/L (ref 0–40)
Albumin: 4.6 g/dL (ref 3.9–4.9)
Alkaline Phosphatase: 224 IU/L — ABNORMAL HIGH (ref 44–121)
Bilirubin Total: 0.4 mg/dL (ref 0.0–1.2)
Bilirubin, Direct: 0.12 mg/dL (ref 0.00–0.40)
Total Protein: 7.3 g/dL (ref 6.0–8.5)

## 2021-12-06 ENCOUNTER — Telehealth: Payer: Self-pay

## 2021-12-06 DIAGNOSIS — N186 End stage renal disease: Secondary | ICD-10-CM

## 2021-12-06 NOTE — Telephone Encounter (Signed)
Pt called stating that her access flow is decreased and she has been waiting for an appt since the request was sent.  Reviewed pt's chart, returned call for clarification, two identifiers used. Determined that even though she is an established pt, there was a referral from her nephrologist. Appts for [place] and [place] scheduled for first available with regard to HD schedule. Confirmed understanding.

## 2021-12-15 ENCOUNTER — Encounter (HOSPITAL_COMMUNITY): Payer: Medicare Other

## 2021-12-15 ENCOUNTER — Ambulatory Visit: Payer: Medicare Other

## 2021-12-21 NOTE — Progress Notes (Unsigned)
Established Dialysis Access   History of Present Illness   Karen [name] is a 66 y.o. (1955-09-19) female who presents for re-evaluation of AVF.  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  She is s/p second stage right brachiobasilic fistula on 87/68/1157.  She has undergone numerous percutaneous interventions to salvage this fistula, including right basilic venoplasty on 02/21/2033 and right basilic vein stent placement on 06/12/2019.  Both of these interventions were done to improve fistula stenosis causing low flow rates at dialysis.  She recently visited our office on 07/12/2021 with concerns for areas of hypopigmentation over her fistula.  At that time there was no concern for revision since she had no ulcerations, and she was to follow-up with [place] as needed.  She is visiting our office today reporting that she is having low flow rates at dialysis again.  They are still able to dialyze through her right brachiobasilic AV fistula, however flow rates have been around 400.  She denies any pain or swelling or signs of infection in the right arm.  She dialyzes on M,W,F at [facility] location.  Current Outpatient Medications  Medication Sig Dispense Refill   amoxicillin (AMOXIL) 500 MG tablet Take 2,000 mg by mouth See admin instructions. Take 2000 mg 1 hour prior to dental work     atorvastatin (LIPITOR) 10 MG tablet TAKE 1 TABLET BY MOUTH EVERY DAY IN THE EVENING 90 tablet 3   clobetasol cream (TEMOVATE) 5.97 % Apply 1 application topically daily as needed (for rash).      diltiazem (CARDIZEM CD) 120 MG 24 hr capsule TAKE 1 CAPSULE (120 MG TOTAL) BY MOUTH EVERY TUESDAY, THURSDAY, SATURDAY, AND SUNDAY AT 6 PM. 52 capsule 6   diltiazem (CARDIZEM) 30 MG tablet TAKE 1 TABLET (30 MG TOTAL) BY MOUTH EVERY MONDAY, WEDNESDAY, AND FRIDAY. 36 tablet 7   ethyl chloride spray Apply 1 application topically every Monday, Wednesday, and Friday with hemodialysis.      fluticasone (FLONASE) 50 MCG/ACT nasal spray Place 1 spray into both nostrils daily as needed for allergies.     HYDROcodone-acetaminophen (NORCO) 5-325 MG tablet Take 1 tablet by mouth every 6 (six) hours as needed for moderate pain. 12 tablet 0   lidocaine-prilocaine (EMLA) cream Apply 1 application topically See admin instructions. Apply topically Monday, Wednesday and Friday before dialysis  6   Methoxy PEG-Epoetin Beta (MIRCERA IJ) Mircera     midodrine (PROAMATINE) 10 MG tablet Take 2 tablets (20 mg total) by mouth every Monday, Wednesday, and Friday. Prior to dialysis 28 tablet 0   multivitamin (RENA-VIT) TABS tablet Take 1 tablet by mouth daily.      ondansetron (ZOFRAN) 8 MG tablet Take 1 tablet (8 mg total) by mouth every 8 (eight) hours as needed for nausea or vomiting. 20 tablet 0   prochlorperazine (COMPAZINE) 10 MG tablet Take 10 mg by mouth every 6 (six) hours as needed (Nausea or vomiting).     sevelamer carbonate (RENVELA) 800 MG tablet Take 1 tablet (800 mg total) by mouth 3 (three) times daily with meals. (Patient taking differently: Take 1,600-2,400 mg by mouth See admin instructions. Take 2400 mg with each meal and 1600 mg with each snack)     No current facility-administered medications for this visit.    REVIEW OF SYSTEMS (negative unless checked):   Cardiac:  '[]'$  Chest pain or chest pressure? '[]'$  Shortness of breath upon activity? '[]'$  Shortness of  breath when lying flat? '[]'$  Irregular heart rhythm?  Vascular:  '[]'$  Pain in calf, thigh, or hip brought on by walking? '[]'$  Pain in feet at night that wakes you up from your sleep? '[]'$  Blood clot in your veins? '[]'$  Leg swelling?  Pulmonary:  '[]'$  Oxygen at home? '[]'$  Productive cough? '[]'$  Wheezing?  Neurologic:  '[]'$  Sudden weakness in arms or legs? '[]'$  Sudden numbness in arms or legs? '[]'$  Sudden onset of difficult speaking or slurred speech? '[]'$  Temporary loss of vision in one eye? '[]'$  Problems with  dizziness?  Gastrointestinal:  '[]'$  Blood in stool? '[]'$  Vomited blood?  Genitourinary:  '[]'$  Burning when urinating? '[]'$  Blood in urine?  Psychiatric:  '[]'$  Major depression  Hematologic:  '[]'$  Bleeding problems? '[]'$  Problems with blood clotting?  Dermatologic:  '[]'$  Rashes or ulcers?  Constitutional:  '[]'$  Fever or chills?  Ear/Nose/Throat:  '[]'$  Change in hearing? '[]'$  Nose bleeds? '[]'$  Sore throat?  Musculoskeletal:  '[]'$  Back pain? '[]'$  Joint pain? '[]'$  Muscle pain?   Physical Examination   Vitals:   12/22/21 1328  BP: 114/74  Pulse: 79  Temp: 97.8 F (36.6 C)  TempSrc: Temporal  SpO2: 100%  Weight: 178 lb (80.7 kg)  Height: '5\' 1"'$  (1.549 m)   Body mass index is 33.63 kg/m.  General:  WDWN in NAD; vital signs documented above Gait: Not observed HENT: WNL, normocephalic Pulmonary: normal non-labored breathing, CTAB Cardiac: Regular rate and rhythm Abdomen: soft, NT, no masses Skin: without rashes Vascular Exam/Pulses: Palpable radial pulses 2+ bilaterally Extremities: Right AV fistula with palpable thrill and good bruit on auscultation.  Small area of hypopigmentation without ulceration.  No right arm swelling.  Musculoskeletal: no muscle wasting or atrophy  Neurologic: A&O X 3;  No focal weakness or paresthesias are detected Psychiatric:  The pt has Normal affect.   Non-invasive Vascular Imaging   right Arm Access Duplex  (12/22/2021):   Patent BVT. Increase in inflow artery pulsatility. Low flow rate- 405 Partial thrombus mid outflow vein. Narrow outflow vein in the ante cubitum. Narrow outflow vein in the proximal upper arm with stenosis likely due to caliber change.   Medical Decision Making   Karen [name] is a 66 y.o. female who presents for evaluation of low flow rates of right brachiobasilic fistula  Right brachiobasilic fistula duplex study today demonstrates a patent fistula with low inflow rates of 405.  There is also evidence of outflow  stenosis with partial thrombus On exam, the right arm fistula has a palpable thrill with some pulsatility.  There is a good bruit on auscultation. She has a palpable right radial pulse with no signs of steal symptoms She has required multiple percutaneous interventions to save this fistula in the past due to stenosis.  She likely has new areas of stenosis that are causing her decreased flow rates.  I will set her up for right AV fistulogram in the next 1-2 weeks with Dr. Trula Slade.  Dialysis can continue to use her fistula in the meantime   Vicente Serene PA-C [facility] Office: [facility] MD: Scot Dock

## 2021-12-21 NOTE — H&P (View-Only) (Signed)
Established Dialysis Access   History of Present Illness   Karen [name] is a 66 y.o. (October 07, 1955) female who presents for re-evaluation of AVF.  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  She is s/p second stage right brachiobasilic fistula on 74/94/4967.  She has undergone numerous percutaneous interventions to salvage this fistula, including right basilic venoplasty on 05/24/1636 and right basilic vein stent placement on 06/12/2019.  Both of these interventions were done to improve fistula stenosis causing low flow rates at dialysis.  She recently visited our office on 07/12/2021 with concerns for areas of hypopigmentation over her fistula.  At that time there was no concern for revision since she had no ulcerations, and she was to follow-up with [place] as needed.  She is visiting our office today reporting that she is having low flow rates at dialysis again.  They are still able to dialyze through her right brachiobasilic AV fistula, however flow rates have been around 400.  She denies any pain or swelling or signs of infection in the right arm.  She dialyzes on M,W,F at the [facility] location.  Current Outpatient Medications  Medication Sig Dispense Refill   amoxicillin (AMOXIL) 500 MG tablet Take 2,000 mg by mouth See admin instructions. Take 2000 mg 1 hour prior to dental work     atorvastatin (LIPITOR) 10 MG tablet TAKE 1 TABLET BY MOUTH EVERY DAY IN THE EVENING 90 tablet 3   clobetasol cream (TEMOVATE) 4.66 % Apply 1 application topically daily as needed (for rash).      diltiazem (CARDIZEM CD) 120 MG 24 hr capsule TAKE 1 CAPSULE (120 MG TOTAL) BY MOUTH EVERY TUESDAY, THURSDAY, SATURDAY, AND SUNDAY AT 6 PM. 52 capsule 6   diltiazem (CARDIZEM) 30 MG tablet TAKE 1 TABLET (30 MG TOTAL) BY MOUTH EVERY MONDAY, WEDNESDAY, AND FRIDAY. 36 tablet 7   ethyl chloride spray Apply 1 application topically every Monday, Wednesday, and Friday with hemodialysis.      fluticasone (FLONASE) 50 MCG/ACT nasal spray Place 1 spray into both nostrils daily as needed for allergies.     HYDROcodone-acetaminophen (NORCO) 5-325 MG tablet Take 1 tablet by mouth every 6 (six) hours as needed for moderate pain. 12 tablet 0   lidocaine-prilocaine (EMLA) cream Apply 1 application topically See admin instructions. Apply topically Monday, Wednesday and Friday before dialysis  6   Methoxy PEG-Epoetin Beta (MIRCERA IJ) Mircera     midodrine (PROAMATINE) 10 MG tablet Take 2 tablets (20 mg total) by mouth every Monday, Wednesday, and Friday. Prior to dialysis 28 tablet 0   multivitamin (RENA-VIT) TABS tablet Take 1 tablet by mouth daily.      ondansetron (ZOFRAN) 8 MG tablet Take 1 tablet (8 mg total) by mouth every 8 (eight) hours as needed for nausea or vomiting. 20 tablet 0   prochlorperazine (COMPAZINE) 10 MG tablet Take 10 mg by mouth every 6 (six) hours as needed (Nausea or vomiting).     sevelamer carbonate (RENVELA) 800 MG tablet Take 1 tablet (800 mg total) by mouth 3 (three) times daily with meals. (Patient taking differently: Take 1,600-2,400 mg by mouth See admin instructions. Take 2400 mg with each meal and 1600 mg with each snack)     No current facility-administered medications for this visit.    REVIEW OF SYSTEMS (negative unless checked):   Cardiac:  '[]'$  Chest pain or chest pressure? '[]'$  Shortness of breath upon activity? '[]'$  Shortness of  breath when lying flat? '[]'$  Irregular heart rhythm?  Vascular:  '[]'$  Pain in calf, thigh, or hip brought on by walking? '[]'$  Pain in feet at night that wakes you up from your sleep? '[]'$  Blood clot in your veins? '[]'$  Leg swelling?  Pulmonary:  '[]'$  Oxygen at home? '[]'$  Productive cough? '[]'$  Wheezing?  Neurologic:  '[]'$  Sudden weakness in arms or legs? '[]'$  Sudden numbness in arms or legs? '[]'$  Sudden onset of difficult speaking or slurred speech? '[]'$  Temporary loss of vision in one eye? '[]'$  Problems with  dizziness?  Gastrointestinal:  '[]'$  Blood in stool? '[]'$  Vomited blood?  Genitourinary:  '[]'$  Burning when urinating? '[]'$  Blood in urine?  Psychiatric:  '[]'$  Major depression  Hematologic:  '[]'$  Bleeding problems? '[]'$  Problems with blood clotting?  Dermatologic:  '[]'$  Rashes or ulcers?  Constitutional:  '[]'$  Fever or chills?  Ear/Nose/Throat:  '[]'$  Change in hearing? '[]'$  Nose bleeds? '[]'$  Sore throat?  Musculoskeletal:  '[]'$  Back pain? '[]'$  Joint pain? '[]'$  Muscle pain?   Physical Examination   Vitals:   12/22/21 1328  BP: 114/74  Pulse: 79  Temp: 97.8 F (36.6 C)  TempSrc: Temporal  SpO2: 100%  Weight: 178 lb (80.7 kg)  Height: '5\' 1"'$  (1.549 m)   Body mass index is 33.63 kg/m.  General:  WDWN in NAD; vital signs documented above Gait: Not observed HENT: WNL, normocephalic Pulmonary: normal non-labored breathing, CTAB Cardiac: Regular rate and rhythm Abdomen: soft, NT, no masses Skin: without rashes Vascular Exam/Pulses: Palpable radial pulses 2+ bilaterally Extremities: Right AV fistula with palpable thrill and good bruit on auscultation.  Small area of hypopigmentation without ulceration.  No right arm swelling.  Musculoskeletal: no muscle wasting or atrophy  Neurologic: A&O X 3;  No focal weakness or paresthesias are detected Psychiatric:  The pt has Normal affect.   Non-invasive Vascular Imaging   right Arm Access Duplex  (12/22/2021):   Patent BVT. Increase in inflow artery pulsatility. Low flow rate- 405 Partial thrombus mid outflow vein. Narrow outflow vein in the ante cubitum. Narrow outflow vein in the proximal upper arm with stenosis likely due to caliber change.   Medical Decision Making   Karen [name] is a 66 y.o. female who presents for evaluation of low flow rates of right brachiobasilic fistula  Right brachiobasilic fistula duplex study today demonstrates a patent fistula with low inflow rates of 405.  There is also evidence of outflow  stenosis with partial thrombus On exam, the right arm fistula has a palpable thrill with some pulsatility.  There is a good bruit on auscultation. She has a palpable right radial pulse with no signs of steal symptoms She has required multiple percutaneous interventions to save this fistula in the past due to stenosis.  She likely has new areas of stenosis that are causing her decreased flow rates.  I will set her up for right AV fistulogram in the next 1-2 weeks with Dr. Trula Slade.  Dialysis can continue to use her fistula in the meantime   Vicente Serene PA-C [facility] Office: [facility] MD: Scot Dock

## 2021-12-22 ENCOUNTER — Ambulatory Visit (HOSPITAL_COMMUNITY)
Admission: RE | Admit: 2021-12-22 | Discharge: 2021-12-22 | Disposition: A | Discharge status: Home / Self Care | Payer: Medicare Other | Source: Ambulatory Visit | Attending: Surgery | Admitting: Surgery

## 2021-12-22 ENCOUNTER — Ambulatory Visit (INDEPENDENT_AMBULATORY_CARE_PROVIDER_SITE_OTHER): Payer: Medicare Other | Admitting: Physician Assistant

## 2021-12-22 VITALS — BP 114/74 | HR 79 | Temp 97.8°F | Ht 61.0 in | Wt 178.0 lb

## 2021-12-22 DIAGNOSIS — Z992 Dependence on renal dialysis: Secondary | ICD-10-CM | POA: Insufficient documentation

## 2021-12-22 DIAGNOSIS — N186 End stage renal disease: Secondary | ICD-10-CM | POA: Insufficient documentation

## 2021-12-22 DIAGNOSIS — T82858A Stenosis of vascular prosthetic devices, implants and grafts, initial encounter: Secondary | ICD-10-CM

## 2021-12-23 ENCOUNTER — Other Ambulatory Visit: Payer: Self-pay

## 2021-12-23 DIAGNOSIS — T82858A Stenosis of vascular prosthetic devices, implants and grafts, initial encounter: Secondary | ICD-10-CM

## 2021-12-23 MED ORDER — SODIUM CHLORIDE 0.9% FLUSH: 3.0000 mL | Freq: Two times a day (BID) | INTRAVENOUS | Status: DC

## 2021-12-23 MED ORDER — SODIUM CHLORIDE 0.9 % IV SOLN: 250.0000 mL | INTRAVENOUS | Status: DC | PRN

## 2021-12-27 ENCOUNTER — Other Ambulatory Visit: Payer: Self-pay

## 2021-12-27 ENCOUNTER — Encounter (HOSPITAL_COMMUNITY): Payer: Self-pay | Admitting: Surgery

## 2021-12-27 ENCOUNTER — Encounter (HOSPITAL_COMMUNITY)
Admission: RE | Disposition: A | Discharge status: Home / Self Care | Payer: Self-pay | Source: Ambulatory Visit | Attending: Surgery

## 2021-12-27 ENCOUNTER — Ambulatory Visit (HOSPITAL_COMMUNITY)
Admission: RE | Admit: 2021-12-27 | Discharge: 2021-12-27 | Disposition: A | Discharge status: Home / Self Care | Payer: Medicare Other | Source: Ambulatory Visit | Attending: Surgery | Admitting: Surgery

## 2021-12-27 DIAGNOSIS — Z992 Dependence on renal dialysis: Secondary | ICD-10-CM | POA: Insufficient documentation

## 2021-12-27 DIAGNOSIS — Y841 Kidney dialysis as the cause of abnormal reaction of the patient, or of later complication, without mention of misadventure at the time of the procedure: Secondary | ICD-10-CM | POA: Diagnosis not present

## 2021-12-27 DIAGNOSIS — T82858A Stenosis of vascular prosthetic devices, implants and grafts, initial encounter: Secondary | ICD-10-CM | POA: Diagnosis not present

## 2021-12-27 DIAGNOSIS — T82898A Other specified complication of vascular prosthetic devices, implants and grafts, initial encounter: Secondary | ICD-10-CM | POA: Diagnosis not present

## 2021-12-27 DIAGNOSIS — N186 End stage renal disease: Secondary | ICD-10-CM | POA: Insufficient documentation

## 2021-12-27 DIAGNOSIS — N185 Chronic kidney disease, stage 5: Secondary | ICD-10-CM | POA: Diagnosis not present

## 2021-12-27 HISTORY — PX: A/V FISTULAGRAM: CATH118298

## 2021-12-27 HISTORY — PX: PERIPHERAL VASCULAR BALLOON ANGIOPLASTY: CATH118281

## 2021-12-27 LAB — POCT I-STAT, CHEM 8
BUN: 31 mg/dL — ABNORMAL HIGH (ref 8–23)
Calcium, Ion: 1.13 mmol/L — ABNORMAL LOW (ref 1.15–1.40)
Chloride: 97 mmol/L — ABNORMAL LOW (ref 98–111)
Creatinine, Ser: 7.2 mg/dL — ABNORMAL HIGH (ref 0.44–1.00)
Glucose, Bld: 83 mg/dL (ref 70–99)
HCT: 38 % (ref 36.0–46.0)
Hemoglobin: 12.9 g/dL (ref 12.0–15.0)
Potassium: 5 mmol/L (ref 3.5–5.1)
Sodium: 141 mmol/L (ref 135–145)
TCO2: 31 mmol/L (ref 22–32)

## 2021-12-27 SURGERY — A/V FISTULAGRAM
Anesthesia: LOCAL | Laterality: Right

## 2021-12-27 MED ORDER — MIDAZOLAM HCL 2 MG/2ML IJ SOLN
INTRAMUSCULAR | Status: AC
  Filled 2021-12-27: qty 2

## 2021-12-27 MED ORDER — FENTANYL CITRATE (PF) 100 MCG/2ML IJ SOLN
INTRAMUSCULAR | Status: DC | PRN
  Administered 2021-12-27: 50 ug via INTRAVENOUS
  Administered 2021-12-27: 25 ug via INTRAVENOUS

## 2021-12-27 MED ORDER — LIDOCAINE HCL (PF) 1 % IJ SOLN
INTRAMUSCULAR | Status: DC | PRN
  Administered 2021-12-27: 2 mL

## 2021-12-27 MED ORDER — LIDOCAINE HCL (PF) 1 % IJ SOLN
INTRAMUSCULAR | Status: AC
  Filled 2021-12-27: qty 30

## 2021-12-27 MED ORDER — HEPARIN (PORCINE) IN NACL 1000-0.9 UT/500ML-% IV SOLN
INTRAVENOUS | Status: AC
  Filled 2021-12-27: qty 500

## 2021-12-27 MED ORDER — MIDAZOLAM HCL 2 MG/2ML IJ SOLN
INTRAMUSCULAR | Status: DC | PRN
  Administered 2021-12-27: 2 mg via INTRAVENOUS
  Administered 2021-12-27: 1 mg via INTRAVENOUS

## 2021-12-27 MED ORDER — HEPARIN (PORCINE) IN NACL 1000-0.9 UT/500ML-% IV SOLN
INTRAVENOUS | Status: DC | PRN
  Administered 2021-12-27: 500 mL

## 2021-12-27 MED ORDER — SODIUM CHLORIDE 0.9% FLUSH: 3.0000 mL | INTRAVENOUS | Status: DC | PRN

## 2021-12-27 MED ORDER — IODIXANOL 320 MG/ML IV SOLN
INTRAVENOUS | Status: DC | PRN
  Administered 2021-12-27: 20 mL via INTRAVENOUS

## 2021-12-27 MED ORDER — FENTANYL CITRATE (PF) 100 MCG/2ML IJ SOLN
INTRAMUSCULAR | Status: AC
  Filled 2021-12-27: qty 2

## 2021-12-27 SURGICAL SUPPLY — 13 items
BALLN LUTONIX AV 8X60X75 (BALLOONS) ×2
BALLOON LUTONIX AV 8X60X75 (BALLOONS) IMPLANT
COVER DOME SNAP 22 D (MISCELLANEOUS) ×2 IMPLANT
KIT ENCORE 26 ADVANTAGE (KITS) IMPLANT
KIT MICROPUNCTURE NIT STIFF (SHEATH) IMPLANT
PROTECTION STATION PRESSURIZED (MISCELLANEOUS) ×2
SHEATH PINNACLE R/O II 6F 4CM (SHEATH) IMPLANT
SHEATH PROBE COVER 6X72 (BAG) ×2 IMPLANT
STATION PROTECTION PRESSURIZED (MISCELLANEOUS) ×2 IMPLANT
STOPCOCK MORSE 400PSI 3WAY (MISCELLANEOUS) ×2 IMPLANT
TRAY PV CATH (CUSTOM PROCEDURE TRAY) ×2 IMPLANT
TUBING CIL FLEX 10 FLL-RA (TUBING) ×2 IMPLANT
WIRE BENTSON .035X145CM (WIRE) IMPLANT

## 2021-12-27 NOTE — Interval H&P Note (Signed)
History and Physical Interval Note:  12/27/2021 7:45 AM  Karen [name]  has presented today for surgery, with the diagnosis of stenosis of av fistula.  The various methods of treatment have been discussed with the patient and family. After consideration of risks, benefits and other options for treatment, the patient has consented to  Procedure(s): A/V Fistulagram (Right) as a surgical intervention.  The patient's history has been reviewed, patient examined, no change in status, stable for surgery.  I have reviewed the patient's chart and labs.  Questions were answered to the patient's satisfaction.     [name]

## 2021-12-27 NOTE — Progress Notes (Signed)
Pt has been d/c awaiting for ride to call for pick-up

## 2021-12-27 NOTE — Op Note (Signed)
    Patient name: Karen [name] MRN: [id number] DOB: August 20, 1955 Sex: female  12/27/2021 Pre-operative Diagnosis: ESRD Post-operative diagnosis:  Same Surgeon:  [name] Procedure Performed:  1.  Ultrasound-guided access, right arm fistula  2.  Fistulogram  3.  Drug-coated balloon venoplasty (8 x 60)  4.  Conscious sedation, 26 minutes    Indications: The patient has had multiple interventions for her fistula.  She is now having decreased flow rates.  She comes in today for fistulogram  Procedure:  The patient was identified in the holding area and taken to room 8.  The patient was then placed supine on the table and prepped and draped in the usual sterile fashion.  A time out was called.  Conscious sedation was administered with the use of IV fentanyl and Versed under continuous physician and nurse monitoring.  Heart rate, blood pressure, and oxygen saturations were continuously monitored.  Total sedation time was 26 minutes ultrasound was used to evaluate the fistula.  The vein was patent and compressible.  A digital ultrasound image was acquired.  The fistula was then accessed under ultrasound guidance using a micropuncture needle.  An 018 wire was then asvanced without resistance and a micropuncture sheath was placed.  Contrast injections were then performed through the sheath.  Findings: The central venous system is widely patent.  There is a stent within the axillary region which is widely patent.  Just proximal to the stent there are multiple areas of luminal irregularity with stenosis greater than 80%.  There is aneurysmal changes within the midportion of the fistula.  The proximal portion of the fistula is without stenosis but small in caliber.   Intervention: After the above images were acquired the decision was made to proceed with intervention.  Over a Bentson wire, a 6 French sheath was placed.  Next, a 8 x 60 Lutonix stent was inserted and inflated to 8 atm for 3  minutes.  The patient did not tolerate this despite significant sedation.  Completion imaging showed improved result however there is still residual 20 to 30% stenosis.  I did not think she would tolerate repeat venoplasty therefore I elected to stop.  The sheath was removed and the access site was closed with a Monocryl.  Impression:  #1  No evidence of central venous stenosis  #2  Stent within the upper portion of the arm is widely patent  #3  Small caliber vein in the proximal fistula without stenosis  #4  Midportion of the fistula has several areas of narrowing, greater than 80%, successfully dilated with a 8 x 60 drug-coated balloon with residual stenosis of 20 to 30%  #5  The patient did not tolerate the procedure secondary to pain.  If she does not get adequate relief, I would recommend doing this in the operating room and try to balloon through the stenosis which could cause vein rupture therefore covered stenting may be required.   Theotis Burrow, M.D., [facility] [place] Office: [phone] Pager:  [phone]

## 2021-12-27 NOTE — Progress Notes (Signed)
Pt and son along w/ son in law received d/c instructions. All questions and concerns addressed. PIV removed and intact. Procedure site intact no bleeding noted to site. Pt in stable condition.

## 2022-02-07 ENCOUNTER — Ambulatory Visit: Payer: Medicare Other

## 2022-02-07 ENCOUNTER — Other Ambulatory Visit: Payer: Self-pay | Admitting: Family Medicine

## 2022-02-07 ENCOUNTER — Ambulatory Visit
Admission: RE | Admit: 2022-02-07 | Discharge: 2022-02-07 | Disposition: A | Discharge status: Home / Self Care | Payer: Medicare Other | Source: Ambulatory Visit | Attending: Family Medicine | Admitting: Family Medicine

## 2022-02-07 DIAGNOSIS — N6489 Other specified disorders of breast: Secondary | ICD-10-CM

## 2022-02-07 DIAGNOSIS — R921 Mammographic calcification found on diagnostic imaging of breast: Secondary | ICD-10-CM

## 2022-02-15 ENCOUNTER — Other Ambulatory Visit: Payer: Self-pay | Admitting: Family Medicine

## 2022-02-15 DIAGNOSIS — R921 Mammographic calcification found on diagnostic imaging of breast: Secondary | ICD-10-CM

## 2022-02-15 DIAGNOSIS — N6489 Other specified disorders of breast: Secondary | ICD-10-CM

## 2022-02-21 ENCOUNTER — Inpatient Hospital Stay: Payer: Medicare Other | Attending: Hematology and Oncology | Admitting: Hematology and Oncology

## 2022-02-21 ENCOUNTER — Other Ambulatory Visit: Payer: Self-pay

## 2022-02-21 VITALS — BP 111/65 | HR 80 | Temp 97.4°F | Resp 19 | Wt 177.5 lb

## 2022-02-21 DIAGNOSIS — Z171 Estrogen receptor negative status [ER-]: Secondary | ICD-10-CM

## 2022-02-21 DIAGNOSIS — Z9221 Personal history of antineoplastic chemotherapy: Secondary | ICD-10-CM | POA: Diagnosis not present

## 2022-02-21 DIAGNOSIS — C50411 Malignant neoplasm of upper-outer quadrant of right female breast: Secondary | ICD-10-CM

## 2022-02-21 DIAGNOSIS — Z853 Personal history of malignant neoplasm of breast: Secondary | ICD-10-CM | POA: Diagnosis present

## 2022-02-21 DIAGNOSIS — Z992 Dependence on renal dialysis: Secondary | ICD-10-CM | POA: Insufficient documentation

## 2022-02-21 DIAGNOSIS — N186 End stage renal disease: Secondary | ICD-10-CM | POA: Diagnosis not present

## 2022-02-21 DIAGNOSIS — Z9011 Acquired absence of right breast and nipple: Secondary | ICD-10-CM | POA: Insufficient documentation

## 2022-02-21 NOTE — Assessment & Plan Note (Addendum)
06/19/2017: Right breast skin thickening and heaviness: Screening mammogram detected 2 cm of microcalcifications in the right breast in the posterior third UOQ.?  Skin involvement, stereotactic biopsy revealed IDC grade 2-3 with high-grade DCIS, lymphovascular invasion present, ER 0%, PR 0%, HER-2 positive ratio 8.69, gene copy #11.3   Neoadjuvant chemotherapy with Taxol Herceptin x10   11/20/2017:Right mastectomy: No residual invasive cancer.  0/1 lymph node negative ER: 0%, negative, PR: 0%, negative, Her2: Positive, ratio 8.69. Ki-67: 30% Herceptin maintenance completed 08/06/2018 Hospitalization: 01/06/2018 - 01/08/2018: Pericarditis unrelated to treatment: ---------------------------------------------------------------------------------------------------------------------------------------------- Breast cancer surveillance:  1. left breast mammogram 02/07/2022: Probably benign asymmetry and calcifications left breast.  66-monthfollow-up recommended breast density category B 2.  Breast exam 02/21/2022: Benign, right chest wall scar is without any lumps or nodules, left breast: No lumps or nodules. No role of antiestrogen therapy because she is ER/PR negative.   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 Anemia due to chronic kidney disease  Hospitalization 02/10/2021-02/11/2021: ESRD: Decreased flow in the fistula treated with stent placement    return to clinic in 1 year for follow-up and after that she could be seen on an as-needed basis

## 2022-02-21 NOTE — Progress Notes (Signed)
Patient Care Team: Lucianne Lei, MD as PCP - General (Family Medicine) Lorretta Harp, MD as PCP - Cardiology (Cardiology) Nicholas Lose, MD as PCP - Hematology/Oncology (Hematology and Oncology) Sherran Needs, NP as PCP - Electrophysiology (Nurse Practitioner) Donato Heinz, MD (Nephrology) Center, [facility]  DIAGNOSIS:  Encounter Diagnosis  Name Primary?   Malignant neoplasm of upper-outer quadrant of right breast in female, estrogen receptor negative ([place]) Yes    SUMMARY OF ONCOLOGIC HISTORY: Oncology History  Malignant neoplasm of upper-outer quadrant of right breast in female, estrogen receptor negative ([place])  06/19/2017 Initial Diagnosis   Right breast skin thickening and heaviness: Screening mammogram detected 2 cm of microcalcifications in the right breast in the posterior third UOQ.?  Skin involvement, stereotactic biopsy ([id number]) revealed IDC grade 2-3 with high-grade DCIS, lymphovascular invasion present, ER 0%, PR 0%, HER-2 positive ratio 8.69, gene copy #11.3 T1CN0 stage Ia   07/02/2017 Cancer Staging   Staging form: Breast, AJCC 8th Edition - Clinical: Stage IA (cT1c, cN0, cM0, G3, ER-, PR-, HER2+) - Signed by Nicholas Lose, MD on 07/02/2017   07/17/2017 - 08/06/2018 Chemotherapy   Taxol Herceptin weekly x12 (07/17/17-09/25/17) followed by Herceptin maintenance x1 year   11/20/2017 Surgery   Right mastectomy ([id number]) under Dr. Coralie Keens: No residual invasive cancer.  0/1 lymph node negative ER: 0%, negative, PR: 0%, negative, Her2: Positive, ratio 8.69. Ki-67: 30%     CHIEF COMPLIANT: Follow-up of right breast cancer     INTERVAL HISTORY: Karen [name] is a 67 y.o. with above-mentioned history of right breast cancer treated with neoadjuvant chemotherapy, right mastectomy, maintenance Herceptin, and who is currently on surveillance. She presents to the clinic today for follow-up. She reports that she has been up and down. She  still going to dialysis 3 times a week. She denies any pain or discomfort in breast..   ALLERGIES:  has No Known Allergies.  MEDICATIONS:  Current Outpatient Medications  Medication Sig Dispense Refill   atorvastatin (LIPITOR) 10 MG tablet TAKE 1 TABLET BY MOUTH EVERY DAY IN THE EVENING 90 tablet 3   clobetasol cream (TEMOVATE) 5.42 % Apply 1 application topically daily as needed (for rash).      diltiazem (CARDIZEM CD) 120 MG 24 hr capsule TAKE 1 CAPSULE (120 MG TOTAL) BY MOUTH EVERY TUESDAY, THURSDAY, SATURDAY, AND SUNDAY AT 6 PM. 52 capsule 6   diltiazem (CARDIZEM) 30 MG tablet TAKE 1 TABLET (30 MG TOTAL) BY MOUTH EVERY MONDAY, WEDNESDAY, AND FRIDAY. 36 tablet 7   ethyl chloride spray Apply 1 application topically every Monday, Wednesday, and Friday with hemodialysis.     fluticasone (FLONASE) 50 MCG/ACT nasal spray Place 1 spray into both nostrils daily as needed for allergies.     HYDROcodone-acetaminophen (NORCO) 5-325 MG tablet Take 1 tablet by mouth every 6 (six) hours as needed for moderate pain. 12 tablet 0   lidocaine-prilocaine (EMLA) cream Apply 1 application topically See admin instructions. Apply topically Monday, Wednesday and Friday before dialysis  6   Methoxy PEG-Epoetin Beta (MIRCERA IJ) Mircera     midodrine (PROAMATINE) 10 MG tablet Take 2 tablets (20 mg total) by mouth every Monday, Wednesday, and Friday. Prior to dialysis 28 tablet 0   multivitamin (RENA-VIT) TABS tablet Take 1 tablet by mouth daily.      ondansetron (ZOFRAN) 8 MG tablet Take 1 tablet (8 mg total) by mouth every 8 (eight) hours as needed for nausea or vomiting. 20 tablet 0   sevelamer  carbonate (RENVELA) 800 MG tablet Take 1 tablet (800 mg total) by mouth 3 (three) times daily with meals. (Patient taking differently: Take 1,600-3,200 mg by mouth See admin instructions. Take 3200 mg with each meal and 1600 mg with each snack)     prochlorperazine (COMPAZINE) 10 MG tablet Take 10 mg by mouth every 6 (six)  hours as needed for nausea or vomiting.     Current Facility-Administered Medications  Medication Dose Route Frequency Provider Last Rate Last Admin   0.9 %  sodium chloride infusion  250 mL Intravenous PRN Serafina Mitchell, MD       sodium chloride flush (NS) 0.9 % injection 3 mL  3 mL Intravenous Q12H Serafina Mitchell, MD        PHYSICAL EXAMINATION: ECOG PERFORMANCE STATUS: 1 - Symptomatic but completely ambulatory  Vitals:   02/21/22 1008  BP: 111/65  Pulse: 80  Resp: 19  Temp: (!) 97.4 F (36.3 C)  SpO2: 100%   Filed Weights   02/21/22 1008  Weight: 177 lb 8 oz (80.5 kg)    BREAST: Right mastectomy scars without any lumps or nodules.  Left breast is without lumps or nodules or slight tenderness to palpation.  (exam performed in the presence of a chaperone)  LABORATORY DATA:  I have reviewed the data as listed    Latest Ref Rng & Units 12/27/2021    5:33 AM 11/11/2021   11:52 AM 07/28/2021   10:52 AM  CMP  Glucose 70 - 99 mg/dL 83     BUN 8 - 23 mg/dL 31     Creatinine 0.44 - 1.00 mg/dL 7.20     Sodium 135 - 145 mmol/L 141     Potassium 3.5 - 5.1 mmol/L 5.0     Chloride 98 - 111 mmol/L 97     Total Protein 6.0 - 8.5 g/dL  7.3  7.2   Total Bilirubin 0.0 - 1.2 mg/dL  0.4  0.4   Alkaline Phos 44 - 121 IU/L  224  206   AST 0 - 40 IU/L  14  15   ALT 0 - 32 IU/L  9  9     Lab Results  Component Value Date   WBC 3.8 (L) 08/20/2020   HGB 12.9 12/27/2021   HCT 38.0 12/27/2021   MCV 102.5 (H) 08/20/2020   PLT 194 08/20/2020   NEUTROABS 2.6 08/06/2018    ASSESSMENT & PLAN:  Malignant neoplasm of upper-outer quadrant of right breast in female, estrogen receptor negative ([place]) 06/19/2017: Right breast skin thickening and heaviness: Screening mammogram detected 2 cm of microcalcifications in the right breast in the posterior third UOQ.?  Skin involvement, stereotactic biopsy revealed IDC grade 2-3 with high-grade DCIS, lymphovascular invasion present, ER 0%, PR 0%,  HER-2 positive ratio 8.69, gene copy #11.3   Neoadjuvant chemotherapy with Taxol Herceptin x10   11/20/2017:Right mastectomy: No residual invasive cancer.  0/1 lymph node negative ER: 0%, negative, PR: 0%, negative, Her2: Positive, ratio 8.69. Ki-67: 30% Herceptin maintenance completed 08/06/2018 Hospitalization: 01/06/2018 - 01/08/2018: Pericarditis unrelated to treatment: ---------------------------------------------------------------------------------------------------------------------------------------------- Breast cancer surveillance:  1. left breast mammogram 02/07/2022: Probably benign asymmetry and calcifications left breast.  43-monthfollow-up recommended breast density category B 2.  Breast exam 02/21/2022: Benign, right chest wall scar is without any lumps or nodules, left breast: No lumps or nodules. No role of antiestrogen therapy because she is ER/PR negative.   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 Anemia due to chronic kidney disease  Hospitalization 02/10/2021-02/11/2021: ESRD: Decreased flow in the fistula treated with stent placement    return to clinic in 1 year for follow-up and after that she could be seen on an as-needed basis   No orders of the defined types were placed in this encounter.  The patient has a good understanding of the overall plan. she agrees with it. she will call with any problems that may develop before the next visit here. Total time spent: 30 mins including face to face time and time spent for planning, charting and co-ordination of care   Harriette Ohara, MD 02/21/22    I Gardiner Coins am acting as a Education administrator for [employer]  I have reviewed the above documentation for accuracy and completeness, and I agree with the above.

## 2022-02-23 ENCOUNTER — Encounter (HOSPITAL_COMMUNITY): Payer: Self-pay | Admitting: *Deleted

## 2022-03-29 ENCOUNTER — Encounter: Payer: Medicare Other | Attending: Physical Medicine & Rehabilitation | Admitting: Physical Medicine & Rehabilitation

## 2022-03-29 ENCOUNTER — Encounter: Payer: Self-pay | Admitting: Physical Medicine & Rehabilitation

## 2022-03-29 VITALS — BP 113/76 | HR 73 | Ht 61.0 in | Wt 180.0 lb

## 2022-03-29 DIAGNOSIS — N186 End stage renal disease: Secondary | ICD-10-CM | POA: Insufficient documentation

## 2022-03-29 DIAGNOSIS — Z992 Dependence on renal dialysis: Secondary | ICD-10-CM | POA: Diagnosis not present

## 2022-03-29 NOTE — Progress Notes (Signed)
Subjective:    Patient ID: Karen [name], female    DOB: [birth year], 67 y.o.   MRN: [id number]  HPI  Karen [name] is here in follow up of her TBI. She has developed headaches again for about a month. Her husband has been ill and requires a lot of physical care which has created a lot of emotional and physical stress. He had a stroke and heart attack over the last few months.   She signed up for hospice at the beginning of the year who helps intermittently. She doesn't have any regular  help from her family.   Her headaches are mostly frontal with radiation to the sides the longer they persist. The pain is not severe but it's not exactly comfortable. The pain can last for about 30 minutes, and typically she tries to just work through it.   Her sleep is fair. Once or twice a month she's up all night due to anxiety. She does deal with some seasonal allergies and she had mild cataracts.     Pain Inventory Average Pain 3 Pain Right Now 0 My pain is aching  In the last 24 hours, has pain interfered with the following? General activity 0 Relation with others 0 Enjoyment of life 0 What TIME of day is your pain at its worst? daytime Sleep (in general) Good  Pain is worse with: inactivity and some activites Pain improves with:  . Relief from Meds: 0  Family History  Problem Relation Age of Onset   Kidney disease Mother    Heart attack Father    Kidney disease Father    Diabetes Sister    Hyperlipidemia Sister    Hypertension Sister    Kidney disease Sister        x2   Social History   Socioeconomic History   Marital status: Married    Spouse name: Not on file   Number of children: 2   Years of education: Not on file   Highest education level: Not on file  Occupational History   Occupation: SHERIFF'S OFFICE    Employer: [employer]  Tobacco Use   Smoking status: Never   Smokeless tobacco: Never  Vaping Use   Vaping Use: Never used  Substance and Sexual  Activity   Alcohol use: No   Drug use: No   Sexual activity: Not on file    Comment: Hysterectomy  Other Topics Concern   Not on file  Social History Narrative   Not on file   Social Determinants of Health   Financial Resource Strain: Not on file  Food Insecurity: Not on file  Transportation Needs: Not on file  Physical Activity: Not on file  Stress: Not on file  Social Connections: Not on file   Past Surgical History:  Procedure Laterality Date   A/V FISTULAGRAM Right 04/22/2019   Procedure: A/V FISTULAGRAM - Right Upper;  Surgeon: Serafina Mitchell, MD;  Location: [place] [facility];  Service: Cardiovascular;  Laterality: Right;   A/V FISTULAGRAM N/A 02/17/2020   Procedure: A/V FISTULAGRAM - Right Upper;  Surgeon: Serafina Mitchell, MD;  Location: [place] [facility];  Service: Cardiovascular;  Laterality: N/A;   A/V FISTULAGRAM Right 11/23/2020   Procedure: A/V FISTULAGRAM;  Surgeon: Serafina Mitchell, MD;  Location: [place] [facility];  Service: Cardiovascular;  Laterality: Right;   A/V FISTULAGRAM Right 12/27/2021   Procedure: A/V Fistulagram;  Surgeon: Serafina Mitchell, MD;  Location: [place] [facility];  Service:  Cardiovascular;  Laterality: Right;   ABDOMINAL HYSTERECTOMY  2005   AV FISTULA PLACEMENT Left 12/09/2013   Procedure: INSERTION OF ARTERIOVENOUS (AV) GORE-TEX GRAFT ARM;  Surgeon: Elam Dutch, MD;  Location: [place];  Service: Vascular;  Laterality: Left;   AV FISTULA PLACEMENT Right 01/01/2018   Procedure: INSERTION OF ARTERIOVENOUS (AV) GORE-TEX GRAFT ARM RIGHT ARM;  Surgeon: Elam Dutch, MD;  Location: [place];  Service: Vascular;  Laterality: Right;   AV FISTULA PLACEMENT Right 09/17/2018   Procedure: 1ST STAGE BASILIC ARTERIOVENOUS (AV) FISTULA CREATION RIGHT ARM;  Surgeon: Waynetta Sandy, MD;  Location: [place];  Service: Vascular;  Laterality: Right;   [place] REMOVAL Left 02/10/2021   Procedure: REMOVAL OF LEFT ARM DIALYSIS GRAFT AND BRACHIAL ARTERY  PATCH ANGIOPLASTY;  Surgeon: Serafina Mitchell, MD;  Location: [place];  Service: Vascular;  Laterality: Left;   [place] Right 11/12/2018   Procedure: SECOND STAGE BASILIC VEIN TRANSPOSITION RIGHT ARM;  Surgeon: Waynetta Sandy, MD;  Location: [place];  Service: Vascular;  Laterality: Right;   BREAST BIOPSY  1990's   BUNIONECTOMY Bilateral    CHOLECYSTECTOMY  2005   COLONOSCOPY     CRANIOTOMY Left 08/02/2020   Procedure: CRANIOTOMY HEMATOMA EVACUATION SUBDURAL;  Surgeon: Eustace Moore, MD;  Location: [place];  Service: Neurosurgery;  Laterality: Left;   FISTULOGRAM Right 06/12/2019   Procedure: Fistulogram of right upper arm arteriovenous fistula;  Surgeon: Serafina Mitchell, MD;  Location: [facility];  Service: Vascular;  Laterality: Right;   INSERTION OF ILIAC STENT  06/12/2019   Procedure: Insertion Of right basilic vein Stent;  Surgeon: Serafina Mitchell, MD;  Location: [place];  Service: Vascular;;   IR AV DIALY SHUNT INTRO NEEDLE/INTRACATH INITIAL W/PTA/IMG RIGHT Right 03/04/2019   IR GENERIC HISTORICAL  01/11/2016   IR US GUIDE VASC ACCESS RIGHT 01/11/2016 Corrie Mckusick, DO [facility]   IR GENERIC HISTORICAL  01/11/2016   IR RADIOLOGY PERIPHERAL GUIDED IV START 01/11/2016 Corrie Mckusick, DO [facility]   IR IMAGING GUIDED PORT INSERTION  07/05/2017   IR REMOVAL TUN CV CATH W/O FL  08/12/2019   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   KNEE ARTHROSCOPY Bilateral    MASTECTOMY W/ SENTINEL NODE BIOPSY Right 11/20/2017   MASTECTOMY W/ SENTINEL NODE BIOPSY Right 11/20/2017   Procedure: RIGHT MASTECTOMY WITH SENTINEL LYMPH NODE BIOPSY;  Surgeon: Coralie Keens, MD;  Location: [place];  Service: General;  Laterality: Right;   PERIPHERAL VASCULAR BALLOON ANGIOPLASTY  02/17/2020   Procedure: PERIPHERAL VASCULAR BALLOON ANGIOPLASTY;  Surgeon: Serafina Mitchell, MD;  Location: [place] [facility];  Service: Cardiovascular;;   PERIPHERAL VASCULAR BALLOON ANGIOPLASTY  11/23/2020    Procedure: PERIPHERAL VASCULAR BALLOON ANGIOPLASTY;  Surgeon: Serafina Mitchell, MD;  Location: [place] [facility];  Service: Cardiovascular;;   PERIPHERAL VASCULAR BALLOON ANGIOPLASTY  12/27/2021   Procedure: PERIPHERAL VASCULAR BALLOON ANGIOPLASTY;  Surgeon: Serafina Mitchell, MD;  Location: [place] [facility];  Service: Cardiovascular;;   PERIPHERAL VASCULAR CATHETERIZATION N/A 08/31/2015   Procedure: A/V Shuntogram;  Surgeon: Serafina Mitchell, MD;  Location: [place] [facility];  Service: Cardiovascular;  Laterality: N/A;   PERIPHERAL VASCULAR CATHETERIZATION Left 08/31/2015   Procedure: Peripheral Vascular Balloon Angioplasty;  Surgeon: Serafina Mitchell, MD;  Location: [place] [facility];  Service: Cardiovascular;  Laterality: Left;  arm fistula   PERIPHERAL VASCULAR INTERVENTION Right 04/22/2019   Procedure: PERIPHERAL VASCULAR INTERVENTION;  Surgeon: Harold Barban  W, MD;  Location: [place] [facility];  Service: Cardiovascular;  Laterality: Right;  FISTULA   PORT-A-CATH REMOVAL N/A 08/12/2018   Procedure: PORT-A-CATH REMOVAL;  Surgeon: Coralie Keens, MD;  Location: [place];  Service: General;  Laterality: N/A;   [place] Right 12/24/2017   Procedure: UPPER EXTREMITY VENOGRAPHY CENTRAL VENOGRAM;  Surgeon: Waynetta Sandy, MD;  Location: [place] [facility];  Service: Cardiovascular;  Laterality: Right;   UPPER EXTREMITY VENOGRAPHY Bilateral 09/02/2018   Procedure: UPPER EXTREMITY VENOGRAPHY;  Surgeon: Waynetta Sandy, MD;  Location: [place] [facility];  Service: Cardiovascular;  Laterality: Bilateral;   Past Surgical History:  Procedure Laterality Date   A/V FISTULAGRAM Right 04/22/2019   Procedure: A/V FISTULAGRAM - Right Upper;  Surgeon: Serafina Mitchell, MD;  Location: [place] [facility];  Service: Cardiovascular;  Laterality: Right;   A/V FISTULAGRAM N/A 02/17/2020   Procedure: A/V FISTULAGRAM - Right Upper;  Surgeon: Serafina Mitchell, MD;  Location: [place] [facility];  Service: Cardiovascular;  Laterality: N/A;   A/V FISTULAGRAM Right 11/23/2020   Procedure: A/V FISTULAGRAM;  Surgeon: Serafina Mitchell, MD;  Location: [place] [facility];  Service: Cardiovascular;  Laterality: Right;   A/V FISTULAGRAM Right 12/27/2021   Procedure: A/V Fistulagram;  Surgeon: Serafina Mitchell, MD;  Location: [place] [facility];  Service: Cardiovascular;  Laterality: Right;   ABDOMINAL HYSTERECTOMY  2005   AV FISTULA PLACEMENT Left 12/09/2013   Procedure: INSERTION OF ARTERIOVENOUS (AV) GORE-TEX GRAFT ARM;  Surgeon: Elam Dutch, MD;  Location: [place];  Service: Vascular;  Laterality: Left;   AV FISTULA PLACEMENT Right 01/01/2018   Procedure: INSERTION OF ARTERIOVENOUS (AV) GORE-TEX GRAFT ARM RIGHT ARM;  Surgeon: Elam Dutch, MD;  Location: [place];  Service: Vascular;  Laterality: Right;   AV FISTULA PLACEMENT Right 09/17/2018   Procedure: 1ST STAGE BASILIC ARTERIOVENOUS (AV) FISTULA CREATION RIGHT ARM;  Surgeon: Waynetta Sandy, MD;  Location: [place];  Service: Vascular;  Laterality: Right;   [place] REMOVAL Left 02/10/2021   Procedure: REMOVAL OF LEFT ARM DIALYSIS GRAFT AND BRACHIAL ARTERY PATCH ANGIOPLASTY;  Surgeon: Serafina Mitchell, MD;  Location: [place];  Service: Vascular;  Laterality: Left;   [place] Right 11/12/2018   Procedure: SECOND STAGE BASILIC VEIN TRANSPOSITION RIGHT ARM;  Surgeon: Waynetta Sandy, MD;  Location: [place];  Service: Vascular;  Laterality: Right;   BREAST BIOPSY  1990's   BUNIONECTOMY Bilateral    CHOLECYSTECTOMY  2005   COLONOSCOPY     CRANIOTOMY Left 08/02/2020   Procedure: CRANIOTOMY HEMATOMA EVACUATION SUBDURAL;  Surgeon: Eustace Moore, MD;  Location: [place];  Service: Neurosurgery;  Laterality: Left;   FISTULOGRAM Right 06/12/2019   Procedure: Fistulogram of right upper arm arteriovenous fistula;  Surgeon: Serafina Mitchell, MD;  Location: [facility] OR;  Service: Vascular;   Laterality: Right;   INSERTION OF ILIAC STENT  06/12/2019   Procedure: Insertion Of right basilic vein Stent;  Surgeon: Serafina Mitchell, MD;  Location: [place];  Service: Vascular;;   IR AV DIALY SHUNT INTRO NEEDLE/INTRACATH INITIAL W/PTA/IMG RIGHT Right 03/04/2019   IR GENERIC HISTORICAL  01/11/2016   IR US GUIDE VASC ACCESS RIGHT 01/11/2016 Corrie Mckusick, DO [facility]   IR GENERIC HISTORICAL  01/11/2016   IR RADIOLOGY PERIPHERAL GUIDED IV START 01/11/2016 Corrie Mckusick, DO [facility]   IR IMAGING GUIDED PORT INSERTION  07/05/2017  IR REMOVAL TUN CV CATH W/O FL  08/12/2019   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   KNEE ARTHROSCOPY Bilateral    MASTECTOMY W/ SENTINEL NODE BIOPSY Right 11/20/2017   MASTECTOMY W/ SENTINEL NODE BIOPSY Right 11/20/2017   Procedure: RIGHT MASTECTOMY WITH SENTINEL LYMPH NODE BIOPSY;  Surgeon: Coralie Keens, MD;  Location: [place];  Service: General;  Laterality: Right;   PERIPHERAL VASCULAR BALLOON ANGIOPLASTY  02/17/2020   Procedure: PERIPHERAL VASCULAR BALLOON ANGIOPLASTY;  Surgeon: Serafina Mitchell, MD;  Location: [place] [facility];  Service: Cardiovascular;;   PERIPHERAL VASCULAR BALLOON ANGIOPLASTY  11/23/2020   Procedure: PERIPHERAL VASCULAR BALLOON ANGIOPLASTY;  Surgeon: Serafina Mitchell, MD;  Location: [place] [facility];  Service: Cardiovascular;;   PERIPHERAL VASCULAR BALLOON ANGIOPLASTY  12/27/2021   Procedure: PERIPHERAL VASCULAR BALLOON ANGIOPLASTY;  Surgeon: Serafina Mitchell, MD;  Location: [place] [facility];  Service: Cardiovascular;;   PERIPHERAL VASCULAR CATHETERIZATION N/A 08/31/2015   Procedure: A/V Shuntogram;  Surgeon: Serafina Mitchell, MD;  Location: [place] [facility];  Service: Cardiovascular;  Laterality: N/A;   PERIPHERAL VASCULAR CATHETERIZATION Left 08/31/2015   Procedure: Peripheral Vascular Balloon Angioplasty;  Surgeon: Serafina Mitchell, MD;  Location: [place] [facility];  Service: Cardiovascular;  Laterality: Left;  arm fistula    PERIPHERAL VASCULAR INTERVENTION Right 04/22/2019   Procedure: PERIPHERAL VASCULAR INTERVENTION;  Surgeon: Serafina Mitchell, MD;  Location: [place] [facility];  Service: Cardiovascular;  Laterality: Right;  FISTULA   PORT-A-CATH REMOVAL N/A 08/12/2018   Procedure: PORT-A-CATH REMOVAL;  Surgeon: Coralie Keens, MD;  Location: [place];  Service: General;  Laterality: N/A;   [place] Right 12/24/2017   Procedure: UPPER EXTREMITY VENOGRAPHY CENTRAL VENOGRAM;  Surgeon: Waynetta Sandy, MD;  Location: [place] [facility];  Service: Cardiovascular;  Laterality: Right;   UPPER EXTREMITY VENOGRAPHY Bilateral 09/02/2018   Procedure: UPPER EXTREMITY VENOGRAPHY;  Surgeon: Waynetta Sandy, MD;  Location: [place] [facility];  Service: Cardiovascular;  Laterality: Bilateral;   Past Medical History:  Diagnosis Date   Anemia    Arthritis    knees   Breast cancer ([place]) 2019   Right Breast Cancer   Cancer [facility])    Chest pain Jan 2016   low risk Myoview    CHF (congestive heart failure) ([place])    Colitis 12/08/2014   [facility]- focal moderate active colitis   Complication of anesthesia 2005   difficulty remembering for a while and waking up   Constipation    Dysrhythmia    h/o A-Fib   Elevated LFTs 2016   [facility]   ESRD on dialysis [facility]) April 2016   MWF [place]   Fundic gland polyps of stomach, benign    GERD (gastroesophageal reflux disease)    Heart murmur Nov 2015   Aortic scleosis- no stenosis   Hemodialysis patient ([place])    Hypertension    Shortness of breath dyspnea    with exertion   BP 113/76   Pulse 73   Ht '5\' 1"'$  (1.549 m)   Wt 180 lb (81.6 kg)   LMP  (LMP Unknown)   SpO2 99%   BMI 34.01 kg/m   Opioid Risk Score:   Fall Risk Score:  `1  Depression screen PHQ 2/9     03/02/2021    2:51 PM 11/03/2020    2:02 PM 09/09/2020    2:04 PM 07/03/2017  1:40 PM  Depression screen PHQ 2/9  Decreased  Interest 0 0 1 0  Down, Depressed, Hopeless 0 0 0 0  PHQ - 2 Score 0 0 1 0  Altered sleeping   1   Tired, decreased energy   1   Change in appetite   0   Feeling bad or failure about yourself    0   Trouble concentrating   1   Moving slowly or fidgety/restless   0   Suicidal thoughts   0   PHQ-9 Score   4      Review of Systems  Neurological:  Positive for headaches.  All other systems reviewed and are negative.     Objective:   Physical Exam  General: No acute distress HEENT: NCAT, EOMI, oral membranes moist Cards: reg rate  Chest: normal effort Abdomen: Soft, NT, ND Skin: dry, intact Extremities: no edema Psych: pleasant and appropriate  Skin: intact Neuro: Alert and oriented x 3. Normal insight and awareness. Intact Memory. Normal language and speech. Cranial nerve exam unremarkable. Muscle 5/5. Sensory exam normal for light touch and pain in all 4 limbs. No limb ataxia or cerebellar signs. No abnormal tone appreciated.   Musculoskeletal: Full ROM, No pain with AROM or PROM in the neck, trunk, or extremities. Posture appropriate             Medical Problem List and Plan: 1.  Decreased functional mobility with gait disturbance secondary to traumatic left SDH with 4 mm left to right midline shift as well as small right subdural hematoma status post left parietal craniotomy evacuation of subdural hematoma 08/02/2020.                -STILL needs to find time for leisure and rest.             -maximize sleep   -background noise for sleep  -respite care at home  -rx allergies as needed 2.  I  Pain Management/post-traumatic headache:               -tylenol '500mg'$  q6 as needed for mild to moderate headache  -hydrocodone for more severe headaches 3.   Atrial fibrillation/chest pain.  per cards   4.  GERD.  hold Protonix for now given loose stool 5.  End-stage renal disease/hyperkalemia.  Hemodialysis per renal service.    6.  Diastolic congestive heart failure.         20  minutes of face to face patient care time were spent during this visit. All questions were encouraged and answered.  Follow up with me PRN

## 2022-03-29 NOTE — Patient Instructions (Addendum)
ALWAYS FEEL FREE TO CALL OUR OFFICE WITH ANY PROBLEMS OR QUESTIONS [id number])  **PLEASE NOTE** ALL MEDICATION REFILL REQUESTS (INCLUDING CONTROLLED SUBSTANCES) NEED TO BE MADE AT LEAST 7 DAYS PRIOR TO REFILL BEING DUE. ANY REFILL REQUESTS INSIDE THAT TIME FRAME MAY RESULT IN DELAYS IN RECEIVING YOUR PRESCRIPTION.    RESPITE CARE FOR YOUR HUSBAND TO GIVE YOURSELF A BREAK TREAT ALLERGIES IF YOU BECOME MORE SYMPTOMATIC BACKGROUND NOISES FOR BEDROOM, (SOOTHING OCEAN SOUNDS OR WHITE NOISE).  CAN YOU USE HEADPHONES TO LISTEN TO IT? TRY TYLENOL '500MG'$  EVERY 6 HOURS AS NEEDED FOR MILD TO MODERATE HEADACHES IF HEADACHE IS REALLY SEVERE YOU COULD TRY HALF OF A HYDROCODONE.

## 2022-05-10 ENCOUNTER — Ambulatory Visit: Payer: Medicare Other | Admitting: Physical Medicine & Rehabilitation

## 2022-05-31 ENCOUNTER — Other Ambulatory Visit: Payer: Self-pay | Admitting: Hematology and Oncology

## 2022-08-10 ENCOUNTER — Ambulatory Visit: Payer: Medicare Other

## 2022-08-10 ENCOUNTER — Ambulatory Visit
Admission: RE | Admit: 2022-08-10 | Discharge: 2022-08-10 | Disposition: A | Discharge status: Home / Self Care | Payer: Medicare Other | Source: Ambulatory Visit | Attending: Family Medicine | Admitting: Family Medicine

## 2022-08-10 DIAGNOSIS — N6489 Other specified disorders of breast: Secondary | ICD-10-CM

## 2022-09-13 ENCOUNTER — Telehealth: Payer: Self-pay

## 2022-09-13 ENCOUNTER — Ambulatory Visit (INDEPENDENT_AMBULATORY_CARE_PROVIDER_SITE_OTHER)
Admission: RE | Admit: 2022-09-13 | Discharge: 2022-09-13 | Disposition: A | Discharge status: Home / Self Care | Payer: Medicare Other | Source: Ambulatory Visit | Attending: Vascular Surgery | Admitting: Vascular Surgery

## 2022-09-13 ENCOUNTER — Other Ambulatory Visit: Payer: Self-pay

## 2022-09-13 ENCOUNTER — Ambulatory Visit (HOSPITAL_COMMUNITY)
Admission: RE | Admit: 2022-09-13 | Discharge: 2022-09-13 | Disposition: A | Discharge status: Home / Self Care | Payer: Medicare Other | Source: Ambulatory Visit | Attending: Vascular Surgery | Admitting: Vascular Surgery

## 2022-09-13 ENCOUNTER — Encounter (HOSPITAL_COMMUNITY)
Admission: RE | Disposition: A | Discharge status: Home / Self Care | Payer: Self-pay | Source: Ambulatory Visit | Attending: Vascular Surgery

## 2022-09-13 DIAGNOSIS — Z992 Dependence on renal dialysis: Secondary | ICD-10-CM | POA: Insufficient documentation

## 2022-09-13 DIAGNOSIS — N186 End stage renal disease: Secondary | ICD-10-CM

## 2022-09-13 DIAGNOSIS — T82858A Stenosis of vascular prosthetic devices, implants and grafts, initial encounter: Secondary | ICD-10-CM | POA: Insufficient documentation

## 2022-09-13 DIAGNOSIS — I132 Hypertensive heart and chronic kidney disease with heart failure and with stage 5 chronic kidney disease, or end stage renal disease: Secondary | ICD-10-CM | POA: Insufficient documentation

## 2022-09-13 DIAGNOSIS — Y841 Kidney dialysis as the cause of abnormal reaction of the patient, or of later complication, without mention of misadventure at the time of the procedure: Secondary | ICD-10-CM | POA: Diagnosis not present

## 2022-09-13 DIAGNOSIS — T82898A Other specified complication of vascular prosthetic devices, implants and grafts, initial encounter: Secondary | ICD-10-CM | POA: Diagnosis not present

## 2022-09-13 DIAGNOSIS — I509 Heart failure, unspecified: Secondary | ICD-10-CM | POA: Insufficient documentation

## 2022-09-13 HISTORY — PX: PERIPHERAL VASCULAR BALLOON ANGIOPLASTY: CATH118281

## 2022-09-13 HISTORY — PX: A/V FISTULAGRAM: CATH118298

## 2022-09-13 LAB — POCT I-STAT, CHEM 8
BUN: 41 mg/dL — ABNORMAL HIGH (ref 8–23)
Calcium, Ion: 1.13 mmol/L — ABNORMAL LOW (ref 1.15–1.40)
Chloride: 99 mmol/L (ref 98–111)
Creatinine, Ser: 9.2 mg/dL — ABNORMAL HIGH (ref 0.44–1.00)
Glucose, Bld: 83 mg/dL (ref 70–99)
HCT: 36 % (ref 36.0–46.0)
Hemoglobin: 12.2 g/dL (ref 12.0–15.0)
Potassium: 4.7 mmol/L (ref 3.5–5.1)
Sodium: 139 mmol/L (ref 135–145)
TCO2: 29 mmol/L (ref 22–32)

## 2022-09-13 SURGERY — A/V FISTULAGRAM
Anesthesia: LOCAL | Laterality: Right

## 2022-09-13 MED ORDER — FENTANYL CITRATE (PF) 100 MCG/2ML IJ SOLN
INTRAMUSCULAR | Status: DC | PRN
  Administered 2022-09-13 (×2): 25 ug via INTRAVENOUS

## 2022-09-13 MED ORDER — SODIUM CHLORIDE 0.9% FLUSH: 3.0000 mL | INTRAVENOUS | Status: DC | PRN

## 2022-09-13 MED ORDER — LIDOCAINE HCL (PF) 1 % IJ SOLN
INTRAMUSCULAR | Status: DC | PRN
  Administered 2022-09-13: 5 mL

## 2022-09-13 MED ORDER — FENTANYL CITRATE (PF) 100 MCG/2ML IJ SOLN
INTRAMUSCULAR | Status: AC
  Filled 2022-09-13: qty 2

## 2022-09-13 MED ORDER — IODIXANOL 320 MG/ML IV SOLN
INTRAVENOUS | Status: DC | PRN
  Administered 2022-09-13: 50 mL

## 2022-09-13 MED ORDER — SODIUM CHLORIDE 0.9 % IV SOLN: 250.0000 mL | INTRAVENOUS | Status: DC | PRN

## 2022-09-13 MED ORDER — MIDAZOLAM HCL 2 MG/2ML IJ SOLN
INTRAMUSCULAR | Status: DC | PRN
  Administered 2022-09-13 (×2): 1 mg via INTRAVENOUS

## 2022-09-13 MED ORDER — SODIUM CHLORIDE 0.9% FLUSH: 3.0000 mL | Freq: Two times a day (BID) | INTRAVENOUS | Status: DC

## 2022-09-13 MED ORDER — LIDOCAINE HCL (PF) 1 % IJ SOLN
INTRAMUSCULAR | Status: AC
  Filled 2022-09-13: qty 30

## 2022-09-13 MED ORDER — HEPARIN (PORCINE) IN NACL 1000-0.9 UT/500ML-% IV SOLN
INTRAVENOUS | Status: DC | PRN
  Administered 2022-09-13 (×2): 500 mL

## 2022-09-13 MED ORDER — MIDAZOLAM HCL 2 MG/2ML IJ SOLN
INTRAMUSCULAR | Status: AC
  Filled 2022-09-13: qty 2

## 2022-09-13 SURGICAL SUPPLY — 12 items
BALLN LUTONIX AV 8X60X75 (BALLOONS) ×1
BALLOON LUTONIX AV 8X60X75 (BALLOONS) IMPLANT
COVER DOME SNAP 22 D (MISCELLANEOUS) ×1 IMPLANT
KIT ENCORE 26 ADVANTAGE (KITS) IMPLANT
KIT MICROPUNCTURE NIT STIFF (SHEATH) IMPLANT
SET ATX-X65L (MISCELLANEOUS) IMPLANT
SHEATH PINNACLE R/O II 6F 4CM (SHEATH) IMPLANT
SHEATH PROBE COVER 6X72 (BAG) ×1 IMPLANT
STOPCOCK MORSE 400PSI 3WAY (MISCELLANEOUS) ×1 IMPLANT
TRAY PV CATH (CUSTOM PROCEDURE TRAY) ×1 IMPLANT
TUBING CIL FLEX 10 FLL-RA (TUBING) ×1 IMPLANT
WIRE BENTSON .035X145CM (WIRE) IMPLANT

## 2022-09-13 NOTE — Op Note (Signed)
    Patient name: Karen [name] MRN: [id number] DOB: [birth year] Sex: female  09/13/2022 Pre-operative Diagnosis: Right arm fistula malfunction Post-operative diagnosis:  Same Surgeon:  Victorino Sparrow, MD Procedure Performed: 1.  Ultrasound-guided micropuncture access of the right brachiobasilic fistula 2.  Fistulogram 3.  Balloon venoplasty 8 x 60 mm drug-coated to the basilic vein at the mid humerus 4.  Moderate sedation time 23 minutes, contrast volume 20 mL 5.  Access managed with Monocryl suture   Indications: Patient is a 67 year old female with end-stage renal disease currently being dialyzed through a right-sided brachiocephalic fistula.  At her most recent dialysis, eye pressures were noted, and she was unable to complete her run.  She presents today for fistulogram.  After discussing the risk benefits, [place] elected to proceed.  Findings:  Widely patent arterial anastomosis.  2, tandem flow-limiting stenoses at the mid humerus measuring 70% and 80% respectively, distal to the basilic vein stent. No central stenosis appreciated   Procedure:  The patient was identified in the holding area and taken to room 8.  The patient was then placed supine on the table and prepped and draped in the usual sterile fashion.  A time out was called.  Ultrasound was used to evaluate the right arm brachiobasilic fistula.  This was accessed using ultrasound a short sheath was placed and fistulogram followed.  See results above.  I like to intervene on the 2, flow-limiting lesions.  A wire was run into the superior vena cava, next, an 8 x 60 mm drug-coated balloon was brought to the field and inflated to nominal atmosphere for a total of 3 minutes.  Follow-up ultrasound demonstrated excellent result with resolution of flow-limiting stenosis to less than 20%.  Impression: Successful drug-coated balloon venoplasty of flow-limiting stenoses within the right brachiobasilic fistula using an 8 x 60 mm  DCB. Patient can continue to use her access.     Fara Olden, MD [facility] Office: [phone]

## 2022-09-13 NOTE — H&P (Addendum)
Hospital Consult   History of Present Illness: This is a 67 y.o. female with end-stage renal disease currently being dialyzed through a right-sided brachiobasilic fistula.  This fistula ran fine 2 days ago, but earlier today, there was poor flow, resulting in a short dialysis run.  She was seen in triage with a pulsatile fistula and asked to present to [facility] for fistulogram.  On exam, Karen [name] was doing well.  She denies symptoms of steal syndrome.  She stated the fistula worked fine until recently.  Denies prolonged bleeding.  Past Medical History:  Diagnosis Date   Anemia    Arthritis    knees   Breast cancer (HCC) 2019   Right Breast Cancer   Cancer [facility])    Chest pain Jan 2016   low risk Myoview    CHF (congestive heart failure) (HCC)    Colitis 12/08/2014   [facility]- focal moderate active colitis   Complication of anesthesia 2005   difficulty remembering for a while and waking up   Constipation    Dysrhythmia    h/o A-Fib   Elevated LFTs 2016   [facility]   ESRD on dialysis [facility]) April 2016   MWF [facility]   Fundic gland polyps of stomach, benign    GERD (gastroesophageal reflux disease)    Heart murmur Nov 2015   Aortic scleosis- no stenosis   Hemodialysis patient [facility])    Hypertension    Shortness of breath dyspnea    with exertion    Past Surgical History:  Procedure Laterality Date   A/V FISTULAGRAM Right 04/22/2019   Procedure: A/V FISTULAGRAM - Right Upper;  Surgeon: Nada Libman, MD;  [facility];  Service: Cardiovascular;  Laterality: Right;   A/V FISTULAGRAM N/A 02/17/2020   Procedure: A/V FISTULAGRAM - Right Upper;  Surgeon: Nada Libman, MD;  [facility]: [facility];  Service: Cardiovascular;  Laterality: N/A;   A/V FISTULAGRAM Right 11/23/2020   Procedure: A/V FISTULAGRAM;  Surgeon: Nada Libman, MD;  [facility];  Service: Cardiovascular;  Laterality: Right;   A/V FISTULAGRAM Right  12/27/2021   Procedure: A/V Fistulagram;  Surgeon: Nada Libman, MD;  [facility];  Service: Cardiovascular;  Laterality: Right;   ABDOMINAL HYSTERECTOMY  2005   AV FISTULA PLACEMENT Left 12/09/2013   Procedure: INSERTION OF ARTERIOVENOUS (AV) GORE-TEX GRAFT ARM;  Surgeon: Sherren Kerns, MD;  Location: [facility] OR;  Service: Vascular;  Laterality: Left;   AV FISTULA PLACEMENT Right 01/01/2018   Procedure: INSERTION OF ARTERIOVENOUS (AV) GORE-TEX GRAFT ARM RIGHT ARM;  Surgeon: Sherren Kerns, MD;  Location: [facility] OR;  Service: Vascular;  Laterality: Right;   AV FISTULA PLACEMENT Right 09/17/2018   Procedure: 1ST STAGE BASILIC ARTERIOVENOUS (AV) FISTULA CREATION RIGHT ARM;  Surgeon: Maeola Harman, MD;  Location: [facility] OR;  Service: Vascular;  Laterality: Right;   AVGG REMOVAL Left 02/10/2021   Procedure: REMOVAL OF LEFT ARM DIALYSIS GRAFT AND BRACHIAL ARTERY PATCH ANGIOPLASTY;  Surgeon: Nada Libman, MD;  [facility];  Service: Vascular;  Laterality: Left;   BASCILIC VEIN TRANSPOSITION Right 11/12/2018   Procedure: SECOND STAGE BASILIC VEIN TRANSPOSITION RIGHT ARM;  Surgeon: Maeola Harman, MD;  Location[facility] OR;  Service: Vascular;  Laterality: Right;   BREAST BIOPSY  1990's   BUNIONECTOMY Bilateral    CHOLECYSTECTOMY  2005   COLONOSCOPY     CRANIOTOMY Left 08/02/2020   Procedure: CRANIOTOMY HEMATOMA  EVACUATION SUBDURAL;  Surgeon: Tia Alert, MD;  Location: [facility] OR;  Service: Neurosurgery;  Laterality: Left;   FISTULOGRAM Right 06/12/2019   Procedure: Fistulogram of right upper arm arteriovenous fistula;  Surgeon: Nada Libman, MD;  Location: [facility] OR;  Service: Vascular;  Laterality: Right;   INSERTION OF ILIAC STENT  06/12/2019   Procedure: Insertion Of right basilic vein Stent;  Surgeon: Nada Libman, MD;  [facility];  Service: Vascular;;   IR AV DIALY SHUNT INTRO NEEDLE/INTRACATH INITIAL W/PTA/IMG RIGHT Right 03/04/2019   IR GENERIC  HISTORICAL  01/11/2016   IR US GUIDE VASC ACCESS RIGHT 01/11/2016 Gilmer Mor, DO [facility]   IR GENERIC HISTORICAL  01/11/2016   IR RADIOLOGY PERIPHERAL GUIDED IV START 01/11/2016 Gilmer Mor, DO [facility]   IR IMAGING GUIDED PORT INSERTION  07/05/2017   IR REMOVAL TUN CV CATH W/O FL  08/12/2019   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   KNEE ARTHROSCOPY Bilateral    MASTECTOMY W/ SENTINEL NODE BIOPSY Right 11/20/2017   MASTECTOMY W/ SENTINEL NODE BIOPSY Right 11/20/2017   Procedure: RIGHT MASTECTOMY WITH SENTINEL LYMPH NODE BIOPSY;  Surgeon: Abigail Miyamoto, MD;  [facility];  Service: General;  Laterality: Right;   PERIPHERAL VASCULAR BALLOON ANGIOPLASTY  02/17/2020   Procedure: PERIPHERAL VASCULAR BALLOON ANGIOPLASTY;  Surgeon: Nada Libman, MD;  [facility];  Service: Cardiovascular;;   PERIPHERAL VASCULAR BALLOON ANGIOPLASTY  11/23/2020   Procedure: PERIPHERAL VASCULAR BALLOON ANGIOPLASTY;  Surgeon: Nada Libman, MD;  [facility];  Service: Cardiovascular;;   PERIPHERAL VASCULAR BALLOON ANGIOPLASTY  12/27/2021   Procedure: PERIPHERAL VASCULAR BALLOON ANGIOPLASTY;  Surgeon: Nada Libman, MD;  [facility];  Service: Cardiovascular;;   PERIPHERAL VASCULAR CATHETERIZATION N/A 08/31/2015   Procedure: A/V Shuntogram;  Surgeon: Nada Libman, MD;  [facility];  Service: Cardiovascular;  Laterality: N/A;   PERIPHERAL VASCULAR CATHETERIZATION Left 08/31/2015   Procedure: Peripheral Vascular Balloon Angioplasty;  Surgeon: Nada Libman, MD;  [facility];  Service: Cardiovascular;  Laterality: Left;  arm fistula   PERIPHERAL VASCULAR INTERVENTION Right 04/22/2019   Procedure: PERIPHERAL VASCULAR INTERVENTION;  Surgeon: Nada Libman, MD;  [facility];  Service: Cardiovascular;  Laterality: Right;  FISTULA   PORT-A-CATH REMOVAL N/A 08/12/2018   Procedure: PORT-A-CATH  REMOVAL;  Surgeon: Abigail Miyamoto, MD;  [facility];  Service: General;  Laterality: N/A;   ROTATOR CUFF REPAIR Right 1997   UPPER EXTREMITY VENOGRAPHY Right 12/24/2017   Procedure: UPPER EXTREMITY VENOGRAPHY CENTRAL VENOGRAM;  Surgeon: Maeola Harman, MD;  Location: [facility] INVASIVE CV LAB;  Service: Cardiovascular;  Laterality: Right;   UPPER EXTREMITY VENOGRAPHY Bilateral 09/02/2018   Procedure: UPPER EXTREMITY VENOGRAPHY;  Surgeon: Maeola Harman, MD;  Location: [facility] INVASIVE CV LAB;  Service: Cardiovascular;  Laterality: Bilateral;    No Known Allergies  Prior to Admission medications   Medication Sig Start Date End Date Taking? Authorizing Provider  atorvastatin (LIPITOR) 10 MG tablet TAKE 1 TABLET BY MOUTH EVERY DAY IN THE EVENING 09/15/21  Yes Runell Gess, MD  diltiazem (CARDIZEM CD) 120 MG 24 hr capsule TAKE 1 CAPSULE (120 MG TOTAL) BY MOUTH EVERY TUESDAY, THURSDAY, SATURDAY, AND SUNDAY AT 6 PM. 11/10/21  Yes Newman Nip, NP  diltiazem (CARDIZEM) 30 MG tablet TAKE 1 TABLET (30 MG TOTAL) BY MOUTH EVERY MONDAY, WEDNESDAY, AND FRIDAY. 09/09/21  Yes  Runell Gess, MD  midodrine (PROAMATINE) 10 MG tablet Take 2 tablets (20 mg total) by mouth every Monday, Wednesday, and Friday. Prior to dialysis 08/20/20  Yes Love, Evlyn Kanner, PA-C  multivitamin (RENA-VIT) TABS tablet Take 1 tablet by mouth daily.    Yes [provider]  sevelamer carbonate (RENVELA) 800 MG tablet Take 1 tablet (800 mg total) by mouth 3 (three) times daily with meals. Patient taking differently: Take 1,600-3,200 mg by mouth See admin instructions. Take 3200 mg with each meal and 1600 mg with each snack 08/20/20  Yes Love, Evlyn Kanner, PA-C  clobetasol cream (TEMOVATE) 0.05 % Apply 1 application topically daily as needed (for rash).     [provider]  ethyl chloride spray Apply 1 application topically every Monday, Wednesday, and Friday with hemodialysis.    [provider]   fluticasone (FLONASE) 50 MCG/ACT nasal spray Place 1 spray into both nostrils daily as needed for allergies.    [provider]  HYDROcodone-acetaminophen (NORCO) 5-325 MG tablet Take 1 tablet by mouth every 6 (six) hours as needed for moderate pain. 02/11/21   Rhyne, Ames Coupe, PA-C  lidocaine-prilocaine (EMLA) cream Apply 1 application topically See admin instructions. Apply topically Monday, Wednesday and Friday before dialysis 05/26/15   [provider]  ondansetron (ZOFRAN) 8 MG tablet Take 1 tablet (8 mg total) by mouth every 8 (eight) hours as needed for nausea or vomiting. 08/17/20   Love, Evlyn Kanner, PA-C  prochlorperazine (COMPAZINE) 10 MG tablet Take 10 mg by mouth every 6 (six) hours as needed for nausea or vomiting. 08/21/18 12/23/21  Serena Croissant, MD    Social History   Socioeconomic History   Marital status: Married    Spouse name: Not on file   Number of children: 2   Years of education: Not on file   Highest education level: Not on file  Occupational History   Occupation: SHERIFF'S OFFICE    Employer: [employer]  Tobacco Use   Smoking status: Never   Smokeless tobacco: Never  Vaping Use   Vaping status: Never Used  Substance and Sexual Activity   Alcohol use: No   Drug use: No   Sexual activity: Not on file    Comment: Hysterectomy  Other Topics Concern   Not on file  Social History Narrative   Not on file   Social Determinants of Health   Financial Resource Strain: Not on file  Food Insecurity: Not on file  Transportation Needs: Not on file  Physical Activity: Not on file  Stress: Not on file  Social Connections: Not on file  Intimate Partner Violence: Not on file   Family History  Problem Relation Age of Onset   Kidney disease Mother    Heart attack Father    Kidney disease Father    Diabetes Sister    Hyperlipidemia Sister    Hypertension Sister    Kidney disease Sister        x2    ROS: Otherwise negative unless mentioned  in HPI  Physical Examination  Vitals:   09/13/22 1056  BP: 122/74  Pulse: 65  Resp: 16  Temp: (!) 97.3 F (36.3 C)  SpO2: 99%   Body mass index is 34.01 kg/m.  General:  WDWN in NAD Gait: Not observed HENT: WNL, normocephalic Pulmonary: normal non-labored breathing, without Rales, rhonchi,  wheezing Cardiac: regular, Abdomen:  soft, NT/ND, no masses Skin: dry,w arm Vascular Exam/Pulses: Light thrill, pulsatile closer to axilla Extremities: without  ischemic changes Musculoskeletal: no muscle wasting or atrophy  Neurologic: A&O X 3;  No focal weakness or paresthesias are detected; speech is fluent/normal Psychiatric:  The pt has Normal affect. Lymph:  Unremarkable  CBC    Component Value Date/Time   WBC 3.8 (L) 08/20/2020 0834   RBC 2.77 (L) 08/20/2020 0834   HGB 12.2 09/13/2022 1131   HGB 9.6 (L) 08/06/2018 0751   HCT 36.0 09/13/2022 1131   PLT 194 08/20/2020 0834   PLT 176 08/06/2018 0751   MCV 102.5 (H) 08/20/2020 0834   MCH 32.5 08/20/2020 0834   MCHC 31.7 08/20/2020 0834   RDW 14.6 08/20/2020 0834   LYMPHSABS 0.7 08/06/2018 0751   MONOABS 0.5 08/06/2018 0751   EOSABS 0.3 08/06/2018 0751   BASOSABS 0.1 08/06/2018 0751    BMET    Component Value Date/Time   NA 139 09/13/2022 1131   K 4.7 09/13/2022 1131   CL 99 09/13/2022 1131   CO2 28 08/20/2020 0834   GLUCOSE 83 09/13/2022 1131   BUN 41 (H) 09/13/2022 1131   CREATININE 9.20 (H) 09/13/2022 1131   CREATININE 7.25 (HH) 08/06/2018 0751   CREATININE 5.71 (H) 02/27/2014 0900   CALCIUM 9.0 08/20/2020 0834   CALCIUM 9.0 01/28/2014 1454   GFRNONAA 5 (L) 08/20/2020 0834   GFRNONAA 5 (L) 08/06/2018 0751   GFRAA 5 (L) 04/15/2019 1709   GFRAA 6 (L) 08/06/2018 0751    COAGS: Lab Results  Component Value Date   INR 1.1 08/02/2020   INR 1.00 07/05/2017   INR 1.16 12/18/2014     ASSESSMENT/PLAN: This is a 67 y.o. female with stage renal disease fistula malfunction at last dialysis run.  She presents  today for fistulogram  After discussing the risks and benefits of right arm brachiobasilic fistulogram in an effort to define and improve flow for long-term HD access, Rebekka elected to proceed.   Fara Olden MD [facility] [phone] 09/13/2022  12:45 PM

## 2022-09-13 NOTE — Telephone Encounter (Addendum)
Pt walked into clinic, triage form filled out and given to triage RN.  Called [facility] and spoke with Shanda Bumps, Charity fundraiser (Consulting civil engineer). She stated that Monday, the pt was able to get a full treatment session, but it ran very slowly. Today, she ran for 1 hour, but the pressures were very high. She faxed/emailed notes and referral to this office. Concern for stenosis.  Worked with [place] lab to get pt a dialysis access [place]. Will wait for results to see what pt needs.

## 2022-09-14 ENCOUNTER — Encounter (HOSPITAL_COMMUNITY): Payer: Self-pay | Admitting: Vascular Surgery

## 2022-09-27 ENCOUNTER — Ambulatory Visit: Payer: Medicare Other | Admitting: Physical Medicine & Rehabilitation

## 2022-10-27 ENCOUNTER — Other Ambulatory Visit: Payer: Self-pay | Admitting: Cardiovascular Disease

## 2022-12-16 NOTE — Progress Notes (Unsigned)
   Cardiology Office Note    Date:  12/16/2022  ID:  Karen, [name] 05/24/1955, MRN [id number] PCP:  Renaye Rakers, MD  Cardiologist:  Nanetta Batty, MD  Electrophysiologist:  Rudi Coco, NP (Inactive)   Chief Complaint: ***  History of Present Illness: Karen [name]    Karen [name] is a 67 y.o. 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  Her Cardizem was discontinued at that time.  She also has history of AV fistula stenosis, followed by vascular surgery, in 04/2019 she underwent balloon venoplasty for right basilic vein and was noted to have early recurrence of her stenosis.  In 04/2019 she had a nuclear stress test which showed mild ST deviation, 72% EF and was overall low risk.  Cardiac monitor showed predominant underlying rhythm of sinus, rare SVT with the longest lasting load beats, rare PACs and rare PVCs.  No pauses, AV block or atrial fibrillation. She was last seen by Dr. Allyson Sabal in 06/2021, she had remained stable from a cardiac perspective.  She had follow-up in the A-fib clinic on 09/2021, it was noted that she had minimal A-fib, and a few episodes in August.  Today she presents for follow-up.  She reports that she   Paroxysmal atrial fibrillation:  EKG today inidcates  CHA2DS2-VASc Score =   [ ] .  Therefore, the patient's annual risk of stroke is   %.     Continue diltiazem as needed for atrial fibrillation  Hypotension: Blood pressure today   Hyperlipidemia: Last lipid profile in 07/2021.  Will repeat LFTs and fasting lipid  profile.  Labwork independently reviewed:   ROS: .    Please see the history of present illness. Otherwise, review of systems is positive for ***.  All other systems are reviewed and otherwise negative.  Studies Reviewed: Karen [name]    EKG:  EKG is ordered today, personally reviewed, demonstrating ***  CV Studies: Cardiac studies reviewed are outlined and summarized above. Otherwise please see EMR for full report.   Current Reported Medications:.    No outpatient medications have been marked as taking for the 12/19/22 encounter (Appointment) with Rip Harbour, NP.   Current Facility-Administered Medications for the 12/19/22 encounter (Appointment) with Reather Littler D, NP  Medication   0.9 %  sodium chloride infusion   0.9 %  sodium chloride infusion   sodium chloride flush (NS) 0.9 % injection 3 mL   sodium chloride flush (NS) 0.9 % injection 3 mL    Physical Exam:    VS:  LMP  (LMP Unknown)    Wt Readings from Last 3 Encounters:  09/13/22 180 lb (81.6 kg)  03/29/22 180 lb (81.6 kg)  02/21/22 177 lb 8 oz (80.5 kg)    GEN: Well nourished, well developed in no acute distress NECK: No JVD; No carotid bruits CARDIAC: ***RRR, no murmurs, rubs, gallops RESPIRATORY:  Clear to auscultation without rales, wheezing or rhonchi  ABDOMEN: Soft, non-tender, non-distended EXTREMITIES:  No edema; No acute deformity   Asessement and Plan:.     ***     Disposition: F/u with ***  Signed, Rip Harbour, NP

## 2022-12-19 ENCOUNTER — Ambulatory Visit: Payer: Medicare Other | Admitting: Cardiology

## 2023-01-16 ENCOUNTER — Other Ambulatory Visit: Payer: Self-pay | Admitting: Cardiovascular Disease

## 2023-01-30 ENCOUNTER — Other Ambulatory Visit: Payer: Self-pay | Admitting: Cardiovascular Disease

## 2023-02-20 ENCOUNTER — Ambulatory Visit: Payer: Medicare Other | Admitting: Cardiovascular Disease

## 2023-02-22 ENCOUNTER — Inpatient Hospital Stay: Payer: Medicare Other | Attending: Hematology and Oncology | Admitting: Hematology and Oncology

## 2023-02-22 VITALS — BP 121/70 | HR 93 | Temp 97.2°F | Resp 18 | Ht 61.0 in | Wt 182.0 lb

## 2023-02-22 DIAGNOSIS — Z992 Dependence on renal dialysis: Secondary | ICD-10-CM | POA: Diagnosis not present

## 2023-02-22 DIAGNOSIS — Z853 Personal history of malignant neoplasm of breast: Secondary | ICD-10-CM | POA: Insufficient documentation

## 2023-02-22 DIAGNOSIS — N186 End stage renal disease: Secondary | ICD-10-CM | POA: Diagnosis not present

## 2023-02-22 DIAGNOSIS — Z171 Estrogen receptor negative status [ER-]: Secondary | ICD-10-CM

## 2023-02-22 DIAGNOSIS — C50411 Malignant neoplasm of upper-outer quadrant of right female breast: Secondary | ICD-10-CM

## 2023-02-22 NOTE — Assessment & Plan Note (Signed)
 06/19/2017: Right breast skin thickening and heaviness: Screening mammogram detected 2 cm of microcalcifications in the right breast in the posterior third UOQ.?  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  0/1 lymph node negative ER: 0%, negative, PR: 0%, negative, Her2: Positive, ratio 8.69. Ki-67: 30% Herceptin  maintenance completed 08/06/2018 Hospitalization: 01/06/2018 - 01/08/2018: Pericarditis unrelated to treatment: ---------------------------------------------------------------------------------------------------------------------------------------------- Breast cancer surveillance:  1. left breast mammogram 08/10/2022: Unchanged calcifications breast density category C 2.  Breast exam 02/22/2023: Benign, right chest wall scar is without any lumps or nodules, left breast: No lumps or nodules. No role of antiestrogen therapy because she is ER/PR negative.   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 Anemia due to chronic kidney disease  Hospitalization 02/10/2021-02/11/2021: ESRD: Decreased flow in the fistula treated with stent placement    return to clinic on an as-needed basis

## 2023-02-22 NOTE — Progress Notes (Signed)
 Patient Care Team: Benjamine Aland, MD as PCP - General (Family Medicine) Court Dorn PARAS, MD as PCP - Cardiology (Cardiology) Odean Potts, MD as PCP - Hematology/Oncology (Hematology and Oncology) Dow Arland BROCKS, NP (Inactive) as PCP - Electrophysiology (Nurse Practitioner) Rayburn Pac, MD (Nephrology) Center, [facility]  DIAGNOSIS:  Encounter Diagnosis  Name Primary?   Malignant neoplasm of upper-outer quadrant of right breast in female, estrogen receptor negative (HCC) Yes    SUMMARY OF ONCOLOGIC HISTORY: Oncology History  Malignant neoplasm of upper-outer quadrant of right breast in female, estrogen receptor negative (HCC)  06/19/2017 Initial Diagnosis   Right breast skin thickening and heaviness: Screening mammogram detected 2 cm of microcalcifications in the right breast in the posterior third UOQ.?  Skin involvement, stereotactic biopsy ([id number]) revealed IDC grade 2-3 with high-grade DCIS, lymphovascular invasion present, ER 0%, PR 0%, HER-2 positive ratio 8.69, gene copy #11.3 T1CN0 stage Ia   07/02/2017 Cancer Staging   Staging form: Breast, AJCC 8th Edition - Clinical: Stage IA (cT1c, cN0, cM0, G3, ER-, PR-, HER2+) - Signed by Odean Potts, MD on 07/02/2017   07/17/2017 - 08/06/2018 Chemotherapy   Taxol  Herceptin  weekly x12 (07/17/17-09/25/17) followed by Herceptin  maintenance x1 year   11/20/2017 Surgery   Right mastectomy ([id number]) under Dr. Vicenta Poli: No residual invasive cancer.  0/1 lymph node negative ER: 0%, negative, PR: 0%, negative, Her2: Positive, ratio 8.69. Ki-67: 30%     CHIEF COMPLIANT: Surveillance of breast cancer  HISTORY OF PRESENT ILLNESS:  History of Present Illness   Karen [name] is a 68 year old female who presents for a follow-up visit regarding her health status.  She has been managing her health since her diagnosis in 2019 and is currently undergoing dialysis as a regular part of her treatment  regimen. There are no acute issues related to her dialysis treatment at this time.  She reports new issues with her knees, which have been 'starting to act up.' She is considering the possibility of knee surgery but is currently contemplating starting with injections. She mentioned needing to contact Doctor Liam regarding this issue.  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  No chest pain.         ALLERGIES:  has no known allergies.  MEDICATIONS:  Current Outpatient Medications  Medication Sig Dispense Refill   atorvastatin  (LIPITOR) 10 MG tablet Take 1 tablet (10 mg total) by mouth daily. 30 tablet 0   clobetasol cream (TEMOVATE) 0.05 % Apply 1 application topically daily as needed (for rash).      diltiazem  (CARDIZEM  CD) 120 MG 24 hr capsule TAKE 1 CAPSULE (120 MG TOTAL) BY MOUTH EVERY TUESDAY, THURSDAY, SATURDAY, AND SUNDAY AT 6 PM. 52 capsule 6   diltiazem  (CARDIZEM ) 30 MG tablet TAKE 1 TABLET (30 MG TOTAL) BY MOUTH EVERY MONDAY, WEDNESDAY, AND FRIDAY. 36 tablet 1   ethyl chloride spray Apply 1 application topically every Monday, Wednesday, and Friday with hemodialysis.     fluticasone (FLONASE) 50 MCG/ACT nasal spray Place 1 spray into both nostrils daily as needed for allergies.     HYDROcodone -acetaminophen  (NORCO) 5-325 MG tablet Take 1 tablet by mouth every 6 (six) hours as needed for moderate pain. 12 tablet 0   lidocaine -prilocaine  (EMLA ) cream Apply 1 application topically See admin instructions. Apply topically Monday, Wednesday and Friday before dialysis  6  midodrine  (PROAMATINE ) 10 MG tablet Take 2 tablets (20 mg total) by mouth every Monday, Wednesday, and Friday. Prior to dialysis 28 tablet 0   multivitamin (RENA-VIT) TABS tablet Take 1 tablet by mouth daily.      ondansetron  (ZOFRAN ) 8 MG tablet Take 1 tablet (8 mg total) by mouth every 8 (eight)  hours as needed for nausea or vomiting. 20 tablet 0   prochlorperazine  (COMPAZINE ) 10 MG tablet Take 10 mg by mouth every 6 (six) hours as needed for nausea or vomiting.     sevelamer  carbonate (RENVELA ) 800 MG tablet Take 1 tablet (800 mg total) by mouth 3 (three) times daily with meals. (Patient taking differently: Take 1,600-3,200 mg by mouth See admin instructions. Take 3200 mg with each meal and 1600 mg with each snack)     Current Facility-Administered Medications  Medication Dose Route Frequency Provider Last Rate Last Admin   0.9 %  sodium chloride  infusion  250 mL Intravenous PRN Serene Gaile ORN, MD       0.9 %  sodium chloride  infusion  250 mL Intravenous PRN Robins, Joshua E, MD       sodium chloride  flush (NS) 0.9 % injection 3 mL  3 mL Intravenous Q12H Serene Gaile ORN, MD       sodium chloride  flush (NS) 0.9 % injection 3 mL  3 mL Intravenous Q12H Robins, Joshua E, MD        PHYSICAL EXAMINATION: ECOG PERFORMANCE STATUS: 1 - Symptomatic but completely ambulatory  Vitals:   02/22/23 1052  BP: 121/70  Pulse: 93  Resp: 18  Temp: (!) 97.2 F (36.2 C)  SpO2: 100%   Filed Weights   02/22/23 1052  Weight: 182 lb (82.6 kg)    Physical Exam   BREAST: Density categorized as C.   No lumps or nodules to exam  (exam performed in the presence of a chaperone)  LABORATORY DATA:  I have reviewed the data as listed    Latest Ref Rng & Units 09/13/2022   11:31 AM 12/27/2021    5:33 AM 11/11/2021   11:52 AM  CMP  Glucose 70 - 99 mg/dL 83  83    BUN 8 - 23 mg/dL 41  31    Creatinine 9.55 - 1.00 mg/dL 0.79  2.79    Sodium 864 - 145 mmol/L 139  141    Potassium 3.5 - 5.1 mmol/L 4.7  5.0    Chloride 98 - 111 mmol/L 99  97    Total Protein 6.0 - 8.5 g/dL   7.3   Total Bilirubin 0.0 - 1.2 mg/dL   0.4   Alkaline Phos 44 - 121 IU/L   224   AST 0 - 40 IU/L   14   ALT 0 - 32 IU/L   9     Lab Results  Component Value Date   WBC 3.8 (L) 08/20/2020   HGB 12.2 09/13/2022   HCT  36.0 09/13/2022   MCV 102.5 (H) 08/20/2020   PLT 194 08/20/2020   NEUTROABS 2.6 08/06/2018    ASSESSMENT & PLAN:  Malignant neoplasm of upper-outer quadrant of right breast in female, estrogen receptor negative (HCC) 06/19/2017: Right breast skin thickening and heaviness: Screening mammogram detected 2 cm of microcalcifications in the right breast in the posterior third UOQ.?  Skin involvement, stereotactic biopsy revealed IDC grade 2-3 with high-grade DCIS, lymphovascular invasion present, ER 0%, PR 0%, HER-2 positive ratio 8.69, gene copy #11.3   Neoadjuvant chemotherapy with Taxol   Herceptin  x10   11/20/2017:Right mastectomy: No residual invasive cancer.  0/1 lymph node negative ER: 0%, negative, PR: 0%, negative, Her2: Positive, ratio 8.69. Ki-67: 30% Herceptin  maintenance completed 08/06/2018 Hospitalization: 01/06/2018 - 01/08/2018: Pericarditis unrelated to treatment: ---------------------------------------------------------------------------------------------------------------------------------------------- Breast cancer surveillance:  1. left breast mammogram 08/10/2022: Unchanged calcifications breast density category C 2.  Breast exam 02/22/2023: Benign, right chest wall scar is without any lumps or nodules, left breast: No lumps or nodules. No role of antiestrogen therapy because she is ER/PR negative.   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 Anemia due to chronic kidney disease  Hospitalization 02/10/2021-02/11/2021: ESRD: Decreased flow in the fistula treated with stent placement    return to clinic in 1 year for follow-up        No orders of the defined types were placed in this encounter.  The patient has a good understanding of the overall plan. she agrees with it. she will call with any problems that may develop before the next visit here. Total time spent: 30 mins including face to face time and time spent for planning, charting and co-ordination of care   Naomi MARLA Chad, MD 02/22/23

## 2023-03-26 ENCOUNTER — Other Ambulatory Visit: Payer: Self-pay | Admitting: *Deleted

## 2023-03-26 DIAGNOSIS — N186 End stage renal disease: Secondary | ICD-10-CM

## 2023-03-29 ENCOUNTER — Ambulatory Visit (INDEPENDENT_AMBULATORY_CARE_PROVIDER_SITE_OTHER): Admitting: Physician Assistant

## 2023-03-29 ENCOUNTER — Other Ambulatory Visit: Payer: Self-pay

## 2023-03-29 ENCOUNTER — Ambulatory Visit (HOSPITAL_COMMUNITY)
Admission: RE | Admit: 2023-03-29 | Discharge: 2023-03-29 | Disposition: A | Discharge status: Home / Self Care | Source: Ambulatory Visit | Attending: Vascular Surgery | Admitting: Vascular Surgery

## 2023-03-29 DIAGNOSIS — N186 End stage renal disease: Secondary | ICD-10-CM | POA: Insufficient documentation

## 2023-03-29 DIAGNOSIS — Z992 Dependence on renal dialysis: Secondary | ICD-10-CM | POA: Insufficient documentation

## 2023-03-29 MED ORDER — SODIUM CHLORIDE 0.9% FLUSH: 3.0000 mL | INTRAVENOUS | Status: DC | PRN

## 2023-03-29 NOTE — H&P (View-Only) (Signed)
 HISTORY AND PHYSICAL     CC:  dialysis access Requesting Provider:  Bufford Buttner, MD  HPI: This is a 68 y.o. female here for evaluation of her hemodialysis access.    Dialysis access history: -LUA AVG 12/09/2013 Dr. Darrick Penna -angioplasty left axillary and brachial vein 08/31/2015 Dr. Myra Gianotti -RUA AVG 01/01/2018 Dr. Darrick Penna -right 1st stage BVT 09/17/2018 Dr. Randie Heinz -right 2nd stage BVT 11/12/2018 Dr. Randie Heinz -drug coated balloon venoplasty right basilic vein 04/22/2019 Dr. Myra Gianotti -stent placement right basilic vein 06/12/2019 Dr. Myra Gianotti -right arm venoplasty peripheral vein 02/17/2020 Dr. Myra Gianotti -balloon venoplasty right basilic vein 11/23/2020 Dr. Myra Gianotti -removal of LUA AVG and left brachial artery vein patch angioplasty 02/10/2021 Dr. Myra Gianotti  -drug coated venoplasty 12/27/2021 Dr. Myra Gianotti -balloon venoplasty right BVT fistula 09/13/2022 Dr. Karin Lieu   The pt is right hand dominant.    Pt is on dialysis.   Days of dialysis if applicable:  M/W/F    HD center:  [facility]  location.  She states that she has been having low flow states on the machine.  She denies any pain in her hand.  She is right hand dominant.  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  She had to put him in SNF about a month ago.  She is hoping to find someone to help care for her after transplant.   She has hx of breast cancer.    The pt is on a statin for cholesterol management.  The pt is not on a daily aspirin.  Other AC:  none The pt is on CCB for hypertension.  The pt is not on medication for diabetes.   Tobacco hx:  never  Past Medical History:  Diagnosis Date   Anemia    Arthritis    knees   Breast cancer (HCC) 2019   Right Breast Cancer   Cancer [facility])    Chest pain Jan 2016   low risk Myoview    CHF (congestive heart failure) (HCC)    Colitis 12/08/2014   [facility]- focal moderate active colitis    Complication of anesthesia 2005   difficulty remembering for a while and waking up   Constipation    Dysrhythmia    h/o A-Fib   Elevated LFTs 2016   [facility]   ESRD on dialysis [facility]) April 2016   MWF [facility]   Fundic gland polyps of stomach, benign    GERD (gastroesophageal reflux disease)    Heart murmur Nov 2015   Aortic scleosis- no stenosis   Hemodialysis patient [facility])    Hypertension    Shortness of breath dyspnea    with exertion    Past Surgical History:  Procedure Laterality Date   A/V FISTULAGRAM Right 04/22/2019   Procedure: A/V FISTULAGRAM - Right Upper;  Surgeon: Nada Libman, MD;  Location: [facility] INVASIVE CV LAB;  Service: Cardiovascular;  Laterality: Right;   A/V FISTULAGRAM N/A 02/17/2020   Procedure: A/V FISTULAGRAM - Right Upper;  Surgeon: Nada Libman, MD;  Location: [facility] CV LAB;  Service: Cardiovascular;  Laterality: N/A;   A/V FISTULAGRAM Right 11/23/2020   Procedure: A/V FISTULAGRAM;  Surgeon: Nada Libman, MD;  [facility]: [facility];  Service: Cardiovascular;  Laterality: Right;   A/V FISTULAGRAM Right 12/27/2021   Procedure: A/V Fistulagram;  Surgeon: Nada Libman, MD;  [facility]: [facility];  Service: Cardiovascular;  Laterality:  Right;   A/V FISTULAGRAM Right 09/13/2022   Procedure: A/V Fistulagram;  Surgeon: Victorino Sparrow, MD;  Location: [facility] INVASIVE CV LAB;  Service: Cardiovascular;  Laterality: Right;   ABDOMINAL HYSTERECTOMY  2005   AV FISTULA PLACEMENT Left 12/09/2013   Procedure: INSERTION OF ARTERIOVENOUS (AV) GORE-TEX GRAFT ARM;  Surgeon: Sherren Kerns, MD;  Location: [facility] OR;  Service: Vascular;  Laterality: Left;   AV FISTULA PLACEMENT Right 01/01/2018   Procedure: INSERTION OF ARTERIOVENOUS (AV) GORE-TEX GRAFT ARM RIGHT ARM;  Surgeon: Sherren Kerns, MD;  Location: [facility] OR;  Service: Vascular;  Laterality: Right;   AV FISTULA PLACEMENT Right 09/17/2018   Procedure: 1ST STAGE BASILIC ARTERIOVENOUS  (AV) FISTULA CREATION RIGHT ARM;  Surgeon: Maeola Harman, MD;  Location: [facility] OR;  Service: Vascular;  Laterality: Right;   AVGG REMOVAL Left 02/10/2021   Procedure: REMOVAL OF LEFT ARM DIALYSIS GRAFT AND BRACHIAL ARTERY PATCH ANGIOPLASTY;  Surgeon: Nada Libman, MD;  Location: [facility] OR;  Service: Vascular;  Laterality: Left;   BASCILIC VEIN TRANSPOSITION Right 11/12/2018   Procedure: SECOND STAGE BASILIC VEIN TRANSPOSITION RIGHT ARM;  Surgeon: Maeola Harman, MD;  Location: [facility] OR;  Service: Vascular;  Laterality: Right;   BREAST BIOPSY  1990's   BUNIONECTOMY Bilateral    CHOLECYSTECTOMY  2005   COLONOSCOPY     CRANIOTOMY Left 08/02/2020   Procedure: CRANIOTOMY HEMATOMA EVACUATION SUBDURAL;  Surgeon: Tia Alert, MD;  Location: [facility] OR;  Service: Neurosurgery;  Laterality: Left;   FISTULOGRAM Right 06/12/2019   Procedure: Fistulogram of right upper arm arteriovenous fistula;  Surgeon: Nada Libman, MD;  Location: [facility] OR;  Service: Vascular;  Laterality: Right;   INSERTION OF ILIAC STENT  06/12/2019   Procedure: Insertion Of right basilic vein Stent;  Surgeon: Nada Libman, MD;  [facility];  Service: Vascular;;   IR AV DIALY SHUNT INTRO NEEDLE/INTRACATH INITIAL W/PTA/IMG RIGHT Right 03/04/2019   IR GENERIC HISTORICAL  01/11/2016   IR US GUIDE VASC ACCESS RIGHT 01/11/2016 Gilmer Mor, DO [facility]   IR GENERIC HISTORICAL  01/11/2016   IR RADIOLOGY PERIPHERAL GUIDED IV START 01/11/2016 Gilmer Mor, DO [facility]   IR IMAGING GUIDED PORT INSERTION  07/05/2017   IR REMOVAL TUN CV CATH W/O FL  08/12/2019   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   KNEE ARTHROSCOPY Bilateral    MASTECTOMY W/ SENTINEL NODE BIOPSY Right 11/20/2017   MASTECTOMY W/ SENTINEL NODE BIOPSY Right 11/20/2017   Procedure: RIGHT MASTECTOMY WITH SENTINEL LYMPH NODE BIOPSY;  Surgeon: Abigail Miyamoto, MD;  [facility];  Service: General;  Laterality: Right;   PERIPHERAL VASCULAR BALLOON  ANGIOPLASTY  02/17/2020   Procedure: PERIPHERAL VASCULAR BALLOON ANGIOPLASTY;  Surgeon: Nada Libman, MD;  [facility];  Service: Cardiovascular;;   PERIPHERAL VASCULAR BALLOON ANGIOPLASTY  11/23/2020   Procedure: PERIPHERAL VASCULAR BALLOON ANGIOPLASTY;  Surgeon: Nada Libman, MD;  [facility];  Service: Cardiovascular;;   PERIPHERAL VASCULAR BALLOON ANGIOPLASTY  12/27/2021   Procedure: PERIPHERAL VASCULAR BALLOON ANGIOPLASTY;  Surgeon: Nada Libman, MD;  [facility];  Service: Cardiovascular;;   PERIPHERAL VASCULAR BALLOON ANGIOPLASTY Right 09/13/2022   Procedure: PERIPHERAL VASCULAR BALLOON ANGIOPLASTY;  Surgeon: Victorino Sparrow, MD;  Location[facility] INVASIVE CV LAB;  Service: Cardiovascular;  Laterality: Right;   PERIPHERAL VASCULAR CATHETERIZATION N/A 08/31/2015   Procedure: A/V Shuntogram;  Surgeon: Nada Libman, MD;  Location: [facility] INVASIVE CV  LAB;  Service: Cardiovascular;  Laterality: N/A;   PERIPHERAL VASCULAR CATHETERIZATION Left 08/31/2015   Procedure: Peripheral Vascular Balloon Angioplasty;  Surgeon: Nada Libman, MD;  [facility];  Service: Cardiovascular;  Laterality: Left;  arm fistula   PERIPHERAL VASCULAR INTERVENTION Right 04/22/2019   Procedure: PERIPHERAL VASCULAR INTERVENTION;  Surgeon: Nada Libman, MD;  [facility];  Service: Cardiovascular;  Laterality: Right;  FISTULA   PORT-A-CATH REMOVAL N/A 08/12/2018   Procedure: PORT-A-CATH REMOVAL;  Surgeon: Abigail Miyamoto, MD;  Location: [facility];  Service: General;  Laterality: N/A;   ROTATOR CUFF REPAIR Right 1997   UPPER EXTREMITY VENOGRAPHY Right 12/24/2017   Procedure: UPPER EXTREMITY VENOGRAPHY CENTRAL VENOGRAM;  Surgeon: Maeola Harman, MD;  Location: [facility] INVASIVE CV LAB;  Service: Cardiovascular;  Laterality: Right;   UPPER EXTREMITY VENOGRAPHY Bilateral 09/02/2018   Procedure: UPPER EXTREMITY VENOGRAPHY;  Surgeon: Maeola Harman, MD;  Location: [facility] INVASIVE CV LAB;  Service: Cardiovascular;  Laterality: Bilateral;    No Known Allergies  Current Outpatient Medications  Medication Sig Dispense Refill   atorvastatin (LIPITOR) 10 MG tablet Take 1 tablet (10 mg total) by mouth daily. 30 tablet 0   clobetasol cream (TEMOVATE) 0.05 % Apply 1 application topically daily as needed (for rash).      diltiazem (CARDIZEM CD) 120 MG 24 hr capsule TAKE 1 CAPSULE (120 MG TOTAL) BY MOUTH EVERY TUESDAY, THURSDAY, SATURDAY, AND SUNDAY AT 6 PM. 52 capsule 6   diltiazem (CARDIZEM) 30 MG tablet TAKE 1 TABLET (30 MG TOTAL) BY MOUTH EVERY MONDAY, WEDNESDAY, AND FRIDAY. 36 tablet 1   ethyl chloride spray Apply 1 application topically every Monday, Wednesday, and Friday with hemodialysis.     fluticasone (FLONASE) 50 MCG/ACT nasal spray Place 1 spray into both nostrils daily as needed for allergies.     HYDROcodone-acetaminophen (NORCO) 5-325 MG tablet Take 1 tablet by mouth every 6 (six) hours as needed for moderate pain. 12 tablet 0   lidocaine-prilocaine (EMLA) cream Apply 1 application topically See admin instructions. Apply topically Monday, Wednesday and Friday before dialysis  6   midodrine (PROAMATINE) 10 MG tablet Take 2 tablets (20 mg total) by mouth every Monday, Wednesday, and Friday. Prior to dialysis 28 tablet 0   multivitamin (RENA-VIT) TABS tablet Take 1 tablet by mouth daily.      ondansetron (ZOFRAN) 8 MG tablet Take 1 tablet (8 mg total) by mouth every 8 (eight) hours as needed for nausea or vomiting. 20 tablet 0   prochlorperazine (COMPAZINE) 10 MG tablet Take 10 mg by mouth every 6 (six) hours as needed for nausea or vomiting.     sevelamer carbonate (RENVELA) 800 MG tablet Take 1 tablet (800 mg total) by mouth 3 (three) times daily with meals. (Patient taking differently: Take 1,600-3,200 mg by mouth See admin instructions. Take 3200 mg with each meal and 1600 mg with each snack)     Current  Facility-Administered Medications  Medication Dose Route Frequency Provider Last Rate Last Admin   0.9 %  sodium chloride infusion  250 mL Intravenous PRN Nada Libman, MD       0.9 %  sodium chloride infusion  250 mL Intravenous PRN Victorino Sparrow, MD       sodium chloride flush (NS) 0.9 % injection 3 mL  3 mL Intravenous Q12H Nada Libman, MD       sodium chloride flush (NS) 0.9 % injection 3 mL  3 mL  Intravenous Q12H Victorino Sparrow, MD        Family History  Problem Relation Age of Onset   Kidney disease Mother    Heart attack Father    Kidney disease Father    Diabetes Sister    Hyperlipidemia Sister    Hypertension Sister    Kidney disease Sister        x2    Social History   Socioeconomic History   Marital status: Married    Spouse name: Not on file   Number of children: 2   Years of education: Not on file   Highest education level: Not on file  Occupational History   Occupation: SHERIFF'S OFFICE    Employer: [employer]  Tobacco Use   Smoking status: Never   Smokeless tobacco: Never  Vaping Use   Vaping status: Never Used  Substance and Sexual Activity   Alcohol use: No   Drug use: No   Sexual activity: Not on file    Comment: Hysterectomy  Other Topics Concern   Not on file  Social History Narrative   Not on file   Social Drivers of Health   Financial Resource Strain: Not on file  Food Insecurity: Not on file  Transportation Needs: Not on file  Physical Activity: Not on file  Stress: Not on file  Social Connections: Not on file  Intimate Partner Violence: Not on file     ROS: [x]  Positive   [ ]  Negative   [ ]  All sytems reviewed and are negative  Cardiac: []  chest pain/pressure []  SOB/DOE  Vascular: []  pain in legs while walking []  pain in feet when lying flat []  swelling in legs  Pulmonary: []  asthma []  wheezing  Neurologic: []  hx CVA/TIA  Hematologic: []  bleeding problems  GI []  GERD  GU: [x]  CKD/renal  failure  [x]  HD---[]  M/W/F []  T/T/S  Psychiatric: []  hx of major depression  Integumentary: []  rashes []  ulcers  Constitutional: []  fever []  chills   PHYSICAL EXAMINATION:    General:  WDWN female in NAD Gait: Not observed HENT: WNL Pulmonary: normal non-labored breathing  Cardiac: regular Skin: without rashes Vascular Exam/Pulses:   Right Left  Radial 2+ (normal) 2+ (normal)   Extremities:  right arm fistula is pulsatile.  Musculoskeletal: no muscle wasting or atrophy  Neurologic: A&O X 3  Non-Invasive Vascular Imaging:   Dialysis duplex on 03/29/2023: Findings:  +--------------------+----------+-----------------+--------+  AVF                PSV (cm/s)Flow Vol (mL/min)Comments  +--------------------+----------+-----------------+--------+  Native artery inflow   119           431                 +--------------------+----------+-----------------+--------+  AVF Anastomosis        137                               +--------------------+----------+-----------------+--------+     +------------+----------+-------------+----------+-------------------+  OUTFLOW VEINPSV (cm/s)Diameter (cm)Depth (cm)     Describe        +------------+----------+-------------+----------+-------------------+  Shoulder      233        0.51        0.44   partially-occlusive  +------------+----------+-------------+----------+-------------------+  Prox UA        820        1.36        0.26   partially-occlusive  +------------+----------+-------------+----------+-------------------+  Mid UA          44        2.19        0.65   partially-occlusive  +------------+----------+-------------+----------+-------------------+  Dist UA         70        0.55        1.10                        +------------+----------+-------------+----------+-------------------+  AC Fossa                  0.37        1.24                         +------------+----------+-------------+----------+-------------------+    ASSESSMENT/PLAN: 68 y.o. female with ESRD here for evaluation of her hemodialysis access with hx of right BVT    -pt with right arm BVT with hx of multiple interventions.  Fistula with low flow rates and has partially occlusive thrombus.  Will plan for fistulogram on Tuesday with Dr. Myra Gianotti.  Discussed with pt that access does not last forever and she may require a catheter in the future if needed.  She would like to avoid thigh graft.  -pt is not on anticoagulation   Doreatha Massed, [facility] [phone]  Clinic MD:   Karin Lieu

## 2023-03-29 NOTE — Progress Notes (Signed)
 HISTORY AND PHYSICAL     CC:  dialysis access Requesting Provider:  Bufford Buttner, MD  HPI: This is a 68 y.o. female here for evaluation of her hemodialysis access.    Dialysis access history: -LUA AVG 12/09/2013 Dr. Darrick Penna -angioplasty left axillary and brachial vein 08/31/2015 Dr. Myra Gianotti -RUA AVG 01/01/2018 Dr. Darrick Penna -right 1st stage BVT 09/17/2018 Dr. Randie Heinz -right 2nd stage BVT 11/12/2018 Dr. Randie Heinz -drug coated balloon venoplasty right basilic vein 04/22/2019 Dr. Myra Gianotti -stent placement right basilic vein 06/12/2019 Dr. Myra Gianotti -right arm venoplasty peripheral vein 02/17/2020 Dr. Myra Gianotti -balloon venoplasty right basilic vein 11/23/2020 Dr. Myra Gianotti -removal of LUA AVG and left brachial artery vein patch angioplasty 02/10/2021 Dr. Myra Gianotti  -drug coated venoplasty 12/27/2021 Dr. Myra Gianotti -balloon venoplasty right BVT fistula 09/13/2022 Dr. Karin Lieu   The pt is right hand dominant.    Pt is on dialysis.   Days of dialysis if applicable:  M/W/F    HD center:  [facility]  location.  She states that she has been having low flow states on the machine.  She denies any pain in her hand.  She is right hand dominant.  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  She had to put him in SNF about a month ago.  She is hoping to find someone to help care for her after transplant.   She has hx of breast cancer.    The pt is on a statin for cholesterol management.  The pt is not on a daily aspirin.  Other AC:  none The pt is on CCB for hypertension.  The pt is not on medication for diabetes.   Tobacco hx:  never  Past Medical History:  Diagnosis Date   Anemia    Arthritis    knees   Breast cancer (HCC) 2019   Right Breast Cancer   Cancer [facility])    Chest pain Jan 2016   low risk Myoview    CHF (congestive heart failure) (HCC)    Colitis 12/08/2014   [facility]- focal moderate active colitis    Complication of anesthesia 2005   difficulty remembering for a while and waking up   Constipation    Dysrhythmia    h/o A-Fib   Elevated LFTs 2016   [facility]   ESRD on dialysis [facility]) April 2016   MWF [facility]   Fundic gland polyps of stomach, benign    GERD (gastroesophageal reflux disease)    Heart murmur Nov 2015   Aortic scleosis- no stenosis   Hemodialysis patient [facility])    Hypertension    Shortness of breath dyspnea    with exertion    Past Surgical History:  Procedure Laterality Date   A/V FISTULAGRAM Right 04/22/2019   Procedure: A/V FISTULAGRAM - Right Upper;  Surgeon: Nada Libman, MD;  Location: [facility] INVASIVE CV LAB;  Service: Cardiovascular;  Laterality: Right;   A/V FISTULAGRAM N/A 02/17/2020   Procedure: A/V FISTULAGRAM - Right Upper;  Surgeon: Nada Libman, MD;  Location: [facility];  Service: Cardiovascular;  Laterality: N/A;   A/V FISTULAGRAM Right 11/23/2020   Procedure: A/V FISTULAGRAM;  Surgeon: Nada Libman, MD;  [facility]: [facility];  Service: Cardiovascular;  Laterality: Right;   A/V FISTULAGRAM Right 12/27/2021   Procedure: A/V Fistulagram;  Surgeon: Nada Libman, MD;  [facility];  Service: Cardiovascular;  Laterality:  Right;   A/V FISTULAGRAM Right 09/13/2022   Procedure: A/V Fistulagram;  Surgeon: Victorino Sparrow, MD;  Location: [facility] INVASIVE CV LAB;  Service: Cardiovascular;  Laterality: Right;   ABDOMINAL HYSTERECTOMY  2005   AV FISTULA PLACEMENT Left 12/09/2013   Procedure: INSERTION OF ARTERIOVENOUS (AV) GORE-TEX GRAFT ARM;  Surgeon: Sherren Kerns, MD;  Location: [facility] OR;  Service: Vascular;  Laterality: Left;   AV FISTULA PLACEMENT Right 01/01/2018   Procedure: INSERTION OF ARTERIOVENOUS (AV) GORE-TEX GRAFT ARM RIGHT ARM;  Surgeon: Sherren Kerns, MD;  Location: [facility] OR;  Service: Vascular;  Laterality: Right;   AV FISTULA PLACEMENT Right 09/17/2018   Procedure: 1ST STAGE BASILIC ARTERIOVENOUS  (AV) FISTULA CREATION RIGHT ARM;  Surgeon: Maeola Harman, MD;  Location: [facility] OR;  Service: Vascular;  Laterality: Right;   AVGG REMOVAL Left 02/10/2021   Procedure: REMOVAL OF LEFT ARM DIALYSIS GRAFT AND BRACHIAL ARTERY PATCH ANGIOPLASTY;  Surgeon: Nada Libman, MD;  [facility] OR;  Service: Vascular;  Laterality: Left;   BASCILIC VEIN TRANSPOSITION Right 11/12/2018   Procedure: SECOND STAGE BASILIC VEIN TRANSPOSITION RIGHT ARM;  Surgeon: Maeola Harman, MD;  Location: [facility] OR;  Service: Vascular;  Laterality: Right;   BREAST BIOPSY  1990's   BUNIONECTOMY Bilateral    CHOLECYSTECTOMY  2005   COLONOSCOPY     CRANIOTOMY Left 08/02/2020   Procedure: CRANIOTOMY HEMATOMA EVACUATION SUBDURAL;  Surgeon: Tia Alert, MD;  Location: [facility] OR;  Service: Neurosurgery;  Laterality: Left;   FISTULOGRAM Right 06/12/2019   Procedure: Fistulogram of right upper arm arteriovenous fistula;  Surgeon: Nada Libman, MD;  Location: [facility] OR;  Service: Vascular;  Laterality: Right;   INSERTION OF ILIAC STENT  06/12/2019   Procedure: Insertion Of right basilic vein Stent;  Surgeon: Nada Libman, MD;  [facility];  Service: Vascular;;   IR AV DIALY SHUNT INTRO NEEDLE/INTRACATH INITIAL W/PTA/IMG RIGHT Right 03/04/2019   IR GENERIC HISTORICAL  01/11/2016   IR US GUIDE VASC ACCESS RIGHT 01/11/2016 Gilmer Mor, DO [facility]   IR GENERIC HISTORICAL  01/11/2016   IR RADIOLOGY PERIPHERAL GUIDED IV START 01/11/2016 Gilmer Mor, DO [facility]   IR IMAGING GUIDED PORT INSERTION  07/05/2017   IR REMOVAL TUN CV CATH W/O FL  08/12/2019   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   KNEE ARTHROSCOPY Bilateral    MASTECTOMY W/ SENTINEL NODE BIOPSY Right 11/20/2017   MASTECTOMY W/ SENTINEL NODE BIOPSY Right 11/20/2017   Procedure: RIGHT MASTECTOMY WITH SENTINEL LYMPH NODE BIOPSY;  Surgeon: Abigail Miyamoto, MD;  [facility];  Service: General;  Laterality: Right;   PERIPHERAL VASCULAR BALLOON  ANGIOPLASTY  02/17/2020   Procedure: PERIPHERAL VASCULAR BALLOON ANGIOPLASTY;  Surgeon: Nada Libman, MD;  [facility];  Service: Cardiovascular;;   PERIPHERAL VASCULAR BALLOON ANGIOPLASTY  11/23/2020   Procedure: PERIPHERAL VASCULAR BALLOON ANGIOPLASTY;  Surgeon: Nada Libman, MD;  [facility];  Service: Cardiovascular;;   PERIPHERAL VASCULAR BALLOON ANGIOPLASTY  12/27/2021   Procedure: PERIPHERAL VASCULAR BALLOON ANGIOPLASTY;  Surgeon: Nada Libman, MD;  [facility];  Service: Cardiovascular;;   PERIPHERAL VASCULAR BALLOON ANGIOPLASTY Right 09/13/2022   Procedure: PERIPHERAL VASCULAR BALLOON ANGIOPLASTY;  Surgeon: Victorino Sparrow, MD;  Location: [facility] INVASIVE CV LAB;  Service: Cardiovascular;  Laterality: Right;   PERIPHERAL VASCULAR CATHETERIZATION N/A 08/31/2015   Procedure: A/V Shuntogram;  Surgeon: Nada Libman, MD;  Location: [facility] INVASIVE CV  LAB;  Service: Cardiovascular;  Laterality: N/A;   PERIPHERAL VASCULAR CATHETERIZATION Left 08/31/2015   Procedure: Peripheral Vascular Balloon Angioplasty;  Surgeon: Nada Libman, MD;  [facility];  Service: Cardiovascular;  Laterality: Left;  arm fistula   PERIPHERAL VASCULAR INTERVENTION Right 04/22/2019   Procedure: PERIPHERAL VASCULAR INTERVENTION;  Surgeon: Nada Libman, MD;  [facility];  Service: Cardiovascular;  Laterality: Right;  FISTULA   PORT-A-CATH REMOVAL N/A 08/12/2018   Procedure: PORT-A-CATH REMOVAL;  Surgeon: Abigail Miyamoto, MD;  [facility];  Service: General;  Laterality: N/A;   ROTATOR CUFF REPAIR Right 1997   UPPER EXTREMITY VENOGRAPHY Right 12/24/2017   Procedure: UPPER EXTREMITY VENOGRAPHY CENTRAL VENOGRAM;  Surgeon: Maeola Harman, MD;  Location: [facility] INVASIVE CV LAB;  Service: Cardiovascular;  Laterality: Right;   UPPER EXTREMITY VENOGRAPHY Bilateral 09/02/2018   Procedure: UPPER EXTREMITY VENOGRAPHY;  Surgeon: Maeola Harman, MD;  Location: [facility] INVASIVE CV LAB;  Service: Cardiovascular;  Laterality: Bilateral;    No Known Allergies  Current Outpatient Medications  Medication Sig Dispense Refill   atorvastatin (LIPITOR) 10 MG tablet Take 1 tablet (10 mg total) by mouth daily. 30 tablet 0   clobetasol cream (TEMOVATE) 0.05 % Apply 1 application topically daily as needed (for rash).      diltiazem (CARDIZEM CD) 120 MG 24 hr capsule TAKE 1 CAPSULE (120 MG TOTAL) BY MOUTH EVERY TUESDAY, THURSDAY, SATURDAY, AND SUNDAY AT 6 PM. 52 capsule 6   diltiazem (CARDIZEM) 30 MG tablet TAKE 1 TABLET (30 MG TOTAL) BY MOUTH EVERY MONDAY, WEDNESDAY, AND FRIDAY. 36 tablet 1   ethyl chloride spray Apply 1 application topically every Monday, Wednesday, and Friday with hemodialysis.     fluticasone (FLONASE) 50 MCG/ACT nasal spray Place 1 spray into both nostrils daily as needed for allergies.     HYDROcodone-acetaminophen (NORCO) 5-325 MG tablet Take 1 tablet by mouth every 6 (six) hours as needed for moderate pain. 12 tablet 0   lidocaine-prilocaine (EMLA) cream Apply 1 application topically See admin instructions. Apply topically Monday, Wednesday and Friday before dialysis  6   midodrine (PROAMATINE) 10 MG tablet Take 2 tablets (20 mg total) by mouth every Monday, Wednesday, and Friday. Prior to dialysis 28 tablet 0   multivitamin (RENA-VIT) TABS tablet Take 1 tablet by mouth daily.      ondansetron (ZOFRAN) 8 MG tablet Take 1 tablet (8 mg total) by mouth every 8 (eight) hours as needed for nausea or vomiting. 20 tablet 0   prochlorperazine (COMPAZINE) 10 MG tablet Take 10 mg by mouth every 6 (six) hours as needed for nausea or vomiting.     sevelamer carbonate (RENVELA) 800 MG tablet Take 1 tablet (800 mg total) by mouth 3 (three) times daily with meals. (Patient taking differently: Take 1,600-3,200 mg by mouth See admin instructions. Take 3200 mg with each meal and 1600 mg with each snack)     Current  Facility-Administered Medications  Medication Dose Route Frequency Provider Last Rate Last Admin   0.9 %  sodium chloride infusion  250 mL Intravenous PRN Nada Libman, MD       0.9 %  sodium chloride infusion  250 mL Intravenous PRN Victorino Sparrow, MD       sodium chloride flush (NS) 0.9 % injection 3 mL  3 mL Intravenous Q12H Nada Libman, MD       sodium chloride flush (NS) 0.9 % injection 3 mL  3 mL  Intravenous Q12H Victorino Sparrow, MD        Family History  Problem Relation Age of Onset   Kidney disease Mother    Heart attack Father    Kidney disease Father    Diabetes Sister    Hyperlipidemia Sister    Hypertension Sister    Kidney disease Sister        x2    Social History   Socioeconomic History   Marital status: Married    Spouse name: Not on file   Number of children: 2   Years of education: Not on file   Highest education level: Not on file  Occupational History   Occupation: SHERIFF'S OFFICE    Employer: [employer]  Tobacco Use   Smoking status: Never   Smokeless tobacco: Never  Vaping Use   Vaping status: Never Used  Substance and Sexual Activity   Alcohol use: No   Drug use: No   Sexual activity: Not on file    Comment: Hysterectomy  Other Topics Concern   Not on file  Social History Narrative   Not on file   Social Drivers of Health   Financial Resource Strain: Not on file  Food Insecurity: Not on file  Transportation Needs: Not on file  Physical Activity: Not on file  Stress: Not on file  Social Connections: Not on file  Intimate Partner Violence: Not on file     ROS: [x]  Positive   [ ]  Negative   [ ]  All sytems reviewed and are negative  Cardiac: []  chest pain/pressure []  SOB/DOE  Vascular: []  pain in legs while walking []  pain in feet when lying flat []  swelling in legs  Pulmonary: []  asthma []  wheezing  Neurologic: []  hx CVA/TIA  Hematologic: []  bleeding problems  GI []  GERD  GU: [x]  CKD/renal  failure  [x]  HD---[]  M/W/F []  T/T/S  Psychiatric: []  hx of major depression  Integumentary: []  rashes []  ulcers  Constitutional: []  fever []  chills   PHYSICAL EXAMINATION:    General:  WDWN female in NAD Gait: Not observed HENT: WNL Pulmonary: normal non-labored breathing  Cardiac: regular Skin: without rashes Vascular Exam/Pulses:   Right Left  Radial 2+ (normal) 2+ (normal)   Extremities:  right arm fistula is pulsatile.  Musculoskeletal: no muscle wasting or atrophy  Neurologic: A&O X 3  Non-Invasive Vascular Imaging:   Dialysis duplex on 03/29/2023: Findings:  +--------------------+----------+-----------------+--------+  AVF                PSV (cm/s)Flow Vol (mL/min)Comments  +--------------------+----------+-----------------+--------+  Native artery inflow   119           431                 +--------------------+----------+-----------------+--------+  AVF Anastomosis        137                               +--------------------+----------+-----------------+--------+     +------------+----------+-------------+----------+-------------------+  OUTFLOW VEINPSV (cm/s)Diameter (cm)Depth (cm)     Describe        +------------+----------+-------------+----------+-------------------+  Shoulder      233        0.51        0.44   partially-occlusive  +------------+----------+-------------+----------+-------------------+  Prox UA        820        1.36        0.26   partially-occlusive  +------------+----------+-------------+----------+-------------------+  Mid UA          44        2.19        0.65   partially-occlusive  +------------+----------+-------------+----------+-------------------+  Dist UA         70        0.55        1.10                        +------------+----------+-------------+----------+-------------------+  AC Fossa                  0.37        1.24                         +------------+----------+-------------+----------+-------------------+    ASSESSMENT/PLAN: 68 y.o. female with ESRD here for evaluation of her hemodialysis access with hx of right BVT    -pt with right arm BVT with hx of multiple interventions.  Fistula with low flow rates and has partially occlusive thrombus.  Will plan for fistulogram on Tuesday with Dr. Myra Gianotti.  Discussed with pt that access does not last forever and she may require a catheter in the future if needed.  She would like to avoid thigh graft.  -pt is not on anticoagulation   Doreatha Massed, [facility] [phone]  Clinic MD:   Karin Lieu

## 2023-04-03 ENCOUNTER — Ambulatory Visit (HOSPITAL_COMMUNITY)
Admission: RE | Admit: 2023-04-03 | Discharge: 2023-04-03 | Disposition: A | Discharge status: Home / Self Care | Attending: Surgery | Admitting: Surgery

## 2023-04-03 ENCOUNTER — Emergency Department (HOSPITAL_COMMUNITY)
Admission: EM | Admit: 2023-04-03 | Discharge: 2023-04-03 | Disposition: A | Discharge status: Home / Self Care | Source: Home / Self Care | Attending: Emergency Medicine | Admitting: Emergency Medicine

## 2023-04-03 ENCOUNTER — Encounter (HOSPITAL_COMMUNITY): Payer: Self-pay | Admitting: Emergency Medicine

## 2023-04-03 ENCOUNTER — Encounter (HOSPITAL_COMMUNITY)
Admission: RE | Disposition: A | Discharge status: Home / Self Care | Payer: Self-pay | Source: Home / Self Care | Attending: Surgery

## 2023-04-03 ENCOUNTER — Other Ambulatory Visit: Payer: Self-pay

## 2023-04-03 DIAGNOSIS — T82868A Thrombosis of vascular prosthetic devices, implants and grafts, initial encounter: Secondary | ICD-10-CM

## 2023-04-03 DIAGNOSIS — Z841 Family history of disorders of kidney and ureter: Secondary | ICD-10-CM | POA: Insufficient documentation

## 2023-04-03 DIAGNOSIS — Z992 Dependence on renal dialysis: Secondary | ICD-10-CM | POA: Insufficient documentation

## 2023-04-03 DIAGNOSIS — I97618 Postprocedural hemorrhage and hematoma of a circulatory system organ or structure following other circulatory system procedure: Secondary | ICD-10-CM | POA: Insufficient documentation

## 2023-04-03 DIAGNOSIS — Z853 Personal history of malignant neoplasm of breast: Secondary | ICD-10-CM | POA: Insufficient documentation

## 2023-04-03 DIAGNOSIS — Z833 Family history of diabetes mellitus: Secondary | ICD-10-CM | POA: Insufficient documentation

## 2023-04-03 DIAGNOSIS — N186 End stage renal disease: Secondary | ICD-10-CM | POA: Diagnosis present

## 2023-04-03 DIAGNOSIS — I132 Hypertensive heart and chronic kidney disease with heart failure and with stage 5 chronic kidney disease, or end stage renal disease: Secondary | ICD-10-CM | POA: Insufficient documentation

## 2023-04-03 DIAGNOSIS — Z8249 Family history of ischemic heart disease and other diseases of the circulatory system: Secondary | ICD-10-CM | POA: Diagnosis not present

## 2023-04-03 DIAGNOSIS — Z7682 Awaiting organ transplant status: Secondary | ICD-10-CM | POA: Insufficient documentation

## 2023-04-03 DIAGNOSIS — I509 Heart failure, unspecified: Secondary | ICD-10-CM | POA: Diagnosis not present

## 2023-04-03 HISTORY — PX: PERIPHERAL VASCULAR THROMBECTOMY: CATH118306

## 2023-04-03 HISTORY — PX: DIALYSIS/PERMA CATHETER INSERTION: CATH118288

## 2023-04-03 HISTORY — PX: UPPER EXTREMITY INTERVENTION: CATH118271

## 2023-04-03 HISTORY — PX: A/V FISTULAGRAM: CATH118298

## 2023-04-03 LAB — BASIC METABOLIC PANEL
Anion gap: 15 (ref 5–15)
BUN: 68 mg/dL — ABNORMAL HIGH (ref 8–23)
CO2: 23 mmol/L (ref 22–32)
Calcium: 8.9 mg/dL (ref 8.9–10.3)
Chloride: 98 mmol/L (ref 98–111)
Creatinine, Ser: 14.83 mg/dL — ABNORMAL HIGH (ref 0.44–1.00)
GFR, Estimated: 2 mL/min — ABNORMAL LOW (ref >60)
Glucose, Bld: 106 mg/dL — ABNORMAL HIGH (ref 70–99)
Potassium: 5.7 mmol/L — ABNORMAL HIGH (ref 3.5–5.1)
Sodium: 136 mmol/L (ref 135–145)

## 2023-04-03 LAB — POCT I-STAT, CHEM 8
BUN: 59 mg/dL — ABNORMAL HIGH (ref 8–23)
Calcium, Ion: 1.11 mmol/L — ABNORMAL LOW (ref 1.15–1.40)
Chloride: 105 mmol/L (ref 98–111)
Creatinine, Ser: 16.6 mg/dL — ABNORMAL HIGH (ref 0.44–1.00)
Glucose, Bld: 89 mg/dL (ref 70–99)
HCT: 34 % — ABNORMAL LOW (ref 36.0–46.0)
Hemoglobin: 11.6 g/dL — ABNORMAL LOW (ref 12.0–15.0)
Potassium: 5.4 mmol/L — ABNORMAL HIGH (ref 3.5–5.1)
Sodium: 138 mmol/L (ref 135–145)
TCO2: 26 mmol/L (ref 22–32)

## 2023-04-03 LAB — CBC WITH DIFFERENTIAL/PLATELET
Abs Immature Granulocytes: 0.03 10*3/uL (ref 0.00–0.07)
Basophils Absolute: 0.1 10*3/uL (ref 0.0–0.1)
Basophils Relative: 1 %
Eosinophils Absolute: 0.1 10*3/uL (ref 0.0–0.5)
Eosinophils Relative: 1 %
HCT: 32.9 % — ABNORMAL LOW (ref 36.0–46.0)
Hemoglobin: 10.6 g/dL — ABNORMAL LOW (ref 12.0–15.0)
Immature Granulocytes: 0 %
Lymphocytes Relative: 5 %
Lymphs Abs: 0.5 10*3/uL — ABNORMAL LOW (ref 0.7–4.0)
MCH: 32.1 pg (ref 26.0–34.0)
MCHC: 32.2 g/dL (ref 30.0–36.0)
MCV: 99.7 fL (ref 80.0–100.0)
Monocytes Absolute: 0.8 10*3/uL (ref 0.1–1.0)
Monocytes Relative: 9 %
Neutro Abs: 7.4 10*3/uL (ref 1.7–7.7)
Neutrophils Relative %: 84 %
Platelets: 224 10*3/uL (ref 150–400)
RBC: 3.3 MIL/uL — ABNORMAL LOW (ref 3.87–5.11)
RDW: 13.2 % (ref 11.5–15.5)
WBC: 8.9 10*3/uL (ref 4.0–10.5)
nRBC: 0 % (ref 0.0–0.2)

## 2023-04-03 LAB — TYPE AND SCREEN
ABO/RH(D): B POS
Antibody Screen: NEGATIVE

## 2023-04-03 SURGERY — A/V FISTULAGRAM
Anesthesia: LOCAL | Laterality: Right

## 2023-04-03 MED ORDER — FENTANYL CITRATE (PF) 100 MCG/2ML IJ SOLN
INTRAMUSCULAR | Status: DC | PRN
  Administered 2023-04-03: 50 ug via INTRAVENOUS
  Administered 2023-04-03 (×5): 25 ug via INTRAVENOUS

## 2023-04-03 MED ORDER — MIDAZOLAM HCL 2 MG/2ML IJ SOLN
INTRAMUSCULAR | Status: AC
  Filled 2023-04-03: qty 2

## 2023-04-03 MED ORDER — ALTEPLASE 1 MG/ML SYRINGE FOR VASCULAR PROCEDURE
10.0000 mg | Freq: Once | INTRAMUSCULAR | Status: DC
  Filled 2023-04-03: qty 10

## 2023-04-03 MED ORDER — ALTEPLASE 1 MG/ML SYRINGE FOR VASCULAR PROCEDURE
INTRAMUSCULAR | Status: DC | PRN
Start: 2023-04-03 — End: 2023-04-03
  Administered 2023-04-03: 10 mg

## 2023-04-03 MED ORDER — HEPARIN SODIUM (PORCINE) 1000 UNIT/ML IJ SOLN
INTRAMUSCULAR | Status: AC
  Filled 2023-04-03: qty 10

## 2023-04-03 MED ORDER — IODIXANOL 320 MG/ML IV SOLN
INTRAVENOUS | Status: DC | PRN
  Administered 2023-04-03: 30 mL

## 2023-04-03 MED ORDER — LIDOCAINE HCL (PF) 1 % IJ SOLN
INTRAMUSCULAR | Status: AC
  Filled 2023-04-03: qty 30

## 2023-04-03 MED ORDER — HYDROCODONE-ACETAMINOPHEN 5-325 MG PO TABS
1.0000 | ORAL_TABLET | Freq: Once | ORAL | Status: AC
  Administered 2023-04-03: 1 via ORAL
  Filled 2023-04-03: qty 1

## 2023-04-03 MED ORDER — HEPARIN SODIUM (PORCINE) 1000 UNIT/ML IJ SOLN
INTRAMUSCULAR | Status: DC | PRN
  Administered 2023-04-03: 2000 [IU] via INTRAVENOUS
  Administered 2023-04-03: 8000 [IU] via INTRAVENOUS
  Administered 2023-04-03: 1900 [IU] via INTRAVENOUS
  Administered 2023-04-03: 2000 [IU] via INTRAVENOUS
  Administered 2023-04-03: 1900 [IU] via INTRAVENOUS

## 2023-04-03 MED ORDER — LIDOCAINE HCL (PF) 1 % IJ SOLN
INTRAMUSCULAR | Status: DC | PRN
  Administered 2023-04-03: 15 mL
  Administered 2023-04-03: 5 mL
  Administered 2023-04-03: 15 mL
  Administered 2023-04-03: 5 mL
  Administered 2023-04-03: 2 mL

## 2023-04-03 MED ORDER — FENTANYL CITRATE (PF) 100 MCG/2ML IJ SOLN
INTRAMUSCULAR | Status: AC
  Filled 2023-04-03: qty 2

## 2023-04-03 MED ORDER — HEPARIN (PORCINE) IN NACL 1000-0.9 UT/500ML-% IV SOLN
INTRAVENOUS | Status: DC | PRN
  Administered 2023-04-03: 500 mL

## 2023-04-03 MED ORDER — MIDAZOLAM HCL 2 MG/2ML IJ SOLN
INTRAMUSCULAR | Status: DC | PRN
  Administered 2023-04-03 (×6): 1 mg via INTRAVENOUS

## 2023-04-03 SURGICAL SUPPLY — 19 items
BALLN MUSTANG 7.0X40 75 (BALLOONS) ×1 IMPLANT
BALLOON MUSTANG 7.0X40 75 (BALLOONS) IMPLANT
CANISTER PENUMBRA ENGINE (MISCELLANEOUS) IMPLANT
CATH ANGIO 5F BER2 65CM (CATHETERS) IMPLANT
CATH INDIGO 7D KIT (CATHETERS) IMPLANT
CATH PALINDROME-P 23 W/VT (CATHETERS) IMPLANT
CATH PULSE SPRAY 45X15 5FR (CATHETERS) IMPLANT
COVER DOME SNAP 22 D (MISCELLANEOUS) ×1 IMPLANT
GLIDEWIRE ADV .035X180CM (WIRE) IMPLANT
KIT ENCORE 26 ADVANTAGE (KITS) IMPLANT
KIT MICROPUNCTURE NIT STIFF (SHEATH) IMPLANT
SHEATH PINNACLE R/O II 6F 4CM (SHEATH) IMPLANT
SHEATH PINNACLE R/O II 7F 4CM (SHEATH) IMPLANT
SHEATH PROBE COVER 6X72 (BAG) ×1 IMPLANT
STOPCOCK MORSE 400PSI 3WAY (MISCELLANEOUS) ×1 IMPLANT
TRAY PV CATH (CUSTOM PROCEDURE TRAY) ×1 IMPLANT
TUBING CIL FLEX 10 FLL-RA (TUBING) ×1 IMPLANT
WIRE BENTSON .035X145CM (WIRE) IMPLANT
WIRE TORQFLEX AUST .018X40CM (WIRE) IMPLANT

## 2023-04-03 NOTE — Discharge Instructions (Signed)
 As discussed, if bleeding again occurs, recommend sitting upright in the bed and holding pressure directly for 5 to 10 minutes.  You have been sent home with extra clotting agent to use if needed.  You may change out dressings over area but please do not scrub the area or wash thoroughly.  Please do not hesitate to return if the worrisome signs and symptoms we discussed become apparent.

## 2023-04-03 NOTE — ED Provider Triage Note (Signed)
 Emergency Medicine Provider Triage Evaluation Note  Karen [name] , a 67 y.o. female  was evaluated in triage.  Pt complains of Incision site bleeding from tunneled catheter procedure and fistulogram done today by Dr. Myra Gianotti  Review of Systems  Positive: Bleeding, pain Negative: Fever, chills, shortness of breath, dizziness, headache  Physical Exam  LMP  (LMP Unknown)  Gen:   Awake, no distress   Resp:  Normal effort  MSK:   Moves extremities without difficulty  Other:  Bleeding through Tegaderm on right clavicular area  Medical Decision Making  Medically screening exam initiated at 8:38 PM.  Appropriate orders placed.  Karen [name] was informed that the remainder of the evaluation will be completed by another provider, this initial triage assessment does not replace that evaluation, and the importance of remaining in the ED until their evaluation is complete.  Labs ordered   Dolphus Jenny, Cordelia Poche 04/03/23 2042

## 2023-04-03 NOTE — ED Triage Notes (Signed)
 Pt had a fistulagram done today, pt has continued bleeding from site leaking from bandage with bandage and shirt saturated.

## 2023-04-03 NOTE — Op Note (Signed)
 Patient name: Karen [name] MRN: [id number] DOB: [birth year] Sex: female  04/03/2023 Pre-operative Diagnosis: occluded right arm fistula Post-operative diagnosis:  Same Surgeon:  Durene Cal Procedure Performed:  1.  Ultra sound guided access, right basilic vein x 2  2.  fistulogram  3.  Mechanical thrombectomy with penumbra device of the right basilic vein  4.  Venoplasty right basilic vein  5.  Intravascular administration of TPA (right basilic vein)  6.  Ultrasound guided access right internal jugular vein  7.  Placement of right internal jugular vein tunneled dialysis catheter using fluoroscopic guidance  8.  Conscious sedation, 150 minutes   Indications: This is a 68 year old female who is scheduled for a right arm fistulogram.  Upon evaluation in the holding area, her fistula was found to be occluded.  We talked about proceeding with thrombectomy and if unsuccessful placement of a tunneled dialysis catheter.  Procedure:  The patient was identified in the holding area and taken to room 8.  The patient was then placed supine on the table and prepped and draped in the usual sterile fashion.  A time out was called.  Conscious sedation was administered with the use of IV fentanyl and Versed under continuous physician and nurse monitoring.  Heart rate, blood pressure, and oxygen saturations were continuously monitored.  Total sedation time was 150 minutes ultrasound was used to evaluate the fistula.  There was a large amount of what appeared to be subacute thrombus within the fistula.  It was clearly occluded.    Intervention: I began by getting access of the occluded right basilic vein fistula under ultrasound guidance.  I first cannulated the vein under ultrasound guidance with a micropuncture near the antecubital crease.  A 7 French sheath was placed.  I then got a second access of near the armpit directed towards the antecubital crease under ultrasound guidance and a 6 French  sheath was placed.  The patient was then fully heparinized.  I passed a Glidewire advantage into the central venous system from the 7 [place] sheath.  I then used the CAT the penumbra mechanical thrombectomy device.  I passed this into the central venous system but did not evacuate any thrombus.  Through a catheter in the axillary vein, contrast injections were performed that showed the central venous system was patent and the fistula was occluded.  Next, I inserted a 7 x 40 Mustang balloon and performed balloon maceration of the fistula from the 7 French sheath.  I then reinserted the penumbra catheter and again attempted mechanical thrombectomy with very minimal aspiration of thrombus.  At this point, I placed a 15 cm UniFuse catheter throughout the fistula from the 7 French sheath.  10 mg of tPA were administered through the UniFuse catheter.  I waited 10 minutes.  Then performed balloon maceration of the thrombus and pushed the thrombus into the central venous system.  Next, I inserted a 7 x 40 Mustang balloon into the 6 French sheath catheter out into the brachial artery.  The balloon was slightly inflated and thrombectomy of the arterial and was performed.  I then moved the balloon to the 7 French sheath and placed this thrombus centrally.  I then repeated balloon venoplasty of the entire fistula.  I did have a thrill at this point within the graft but it was rather pulsatile.  I performed fistulogram that showed a significant amount of thrombus remaining in the fistula from the aneurysmal portion.  I again inserted the 7 balloon  into the 6 French sheath across the arterial anastomosis.  I performed repeat thrombectomy and then balloon venoplasty of the proximal fistula.  Again this thrombus was pushed essentially from the 7 [place] sheath.  Venography was then performed that showed inline flow through the fistula however there was still residual thrombus within the aneurysmal portion.  I did not think that any  further efforts would be successful in maintaining patency of his fistula.  He did have an adequate thrill within it but I felt that the catheter was going to be necessary in case this is not a durable repair.  The sheaths were then removed and the cannulation sites closed with a Monocryl.  Attention was then turned towards the right neck and chest.  Ultrasound was used to evaluate the right internal jugular vein which is widely patent and easily compressible.  Lidocaine was used for local anesthesia.  The right internal jugular vein was cannulated under ultrasound guidance with a micropuncture needle.  A 018 wire was inserted followed by placement of a micropuncture sheath.  I then navigated a Glidewire advantage into the inferior vena cava.  The subcutaneous tract was then dilated followed placement of a peel-away sheath.  A skin exit site was selected below the clavicle.  This was anesthetized with lidocaine.  An 11 blade was used to make a skin nick.  I then created a tunnel between the 2 incisions and brought a 23 cm tunneled catheter through the tunnel and fed into the peel-away sheath which was removed.  The catheter tip was at the right atrium.  Both ports flushed and aspirated without difficulty.  There were no kinks within the catheter.  The catheter was filled the appropriate volumes of heparin.  The neck incision was closed with Monocryl and the catheter was sutured into position with 3-0 nylon.  Impression:  #1  Mechanical venous thrombectomy of occluded right basilic vein fistula.  After thrombectomy there remained a large amount of thrombus within the fistula despite an adequate thrill.  I did not feel that this was going to be very durable and that she will likely need a new graft.  I therefore placed a 23 cm right internal jugular vein tunneled dialysis catheter  #2  The patient will return to the office in a few weeks to discuss the possibility of a redo right upper arm graft     V. Durene Cal, M.D., [facility] [place] Office: [phone] Pager:  [phone]

## 2023-04-03 NOTE — ED Provider Notes (Signed)
 [place] EMERGENCY DEPARTMENT AT [facility] Provider Note   CSN: [id number] Arrival date & time: 04/03/23  2013     History  Chief Complaint  Patient presents with   Post-op Problem    Karen [name] is a 68 y.o. female.  HPI   68 year old female presents emergency department with complaints of bleeding.  Patient reports having tunneled IJ hemodialysis catheter placed on the right side.  Was discharged around 5-6 this afternoon.  States that she was home for about 10 to 15 minutes when she noticed bleeding around the site.  Was unable to get the bleeding stopped at home prompting visit to the emergency department.  Was given tPA during procedure earlier today.  Past medical history significant for CHF, breast cancer, ESRD on dialysis, GERD, hypertension, idiopathic pericarditis, subdural hematoma traumatic,  Home Medications Prior to Admission medications   Medication Sig Start Date End Date Taking? Authorizing Provider  atorvastatin (LIPITOR) 10 MG tablet Take 1 tablet (10 mg total) by mouth daily. 01/31/23   Runell Gess, MD  clobetasol cream (TEMOVATE) 0.05 % Apply 1 application topically daily as needed (for rash).     [provider]  diltiazem (CARDIZEM CD) 120 MG 24 hr capsule TAKE 1 CAPSULE (120 MG TOTAL) BY MOUTH EVERY TUESDAY, THURSDAY, SATURDAY, AND SUNDAY AT 6 PM. 11/10/21   Newman Nip, NP  diltiazem (CARDIZEM) 30 MG tablet TAKE 1 TABLET (30 MG TOTAL) BY MOUTH EVERY MONDAY, WEDNESDAY, AND FRIDAY. Patient taking differently: Take 30 mg by mouth every Monday, Wednesday, and Friday. After dialysis 10/27/22   Runell Gess, MD  ethyl chloride spray Apply 1 application topically every Monday, Wednesday, and Friday with hemodialysis.    [provider]  fluticasone (FLONASE) 50 MCG/ACT nasal spray Place 1 spray into both nostrils daily as needed for allergies.    [provider]  lidocaine-prilocaine (EMLA) cream  Apply 1 application topically See admin instructions. Apply topically Monday, Wednesday and Friday before dialysis 05/26/15   [provider]  midodrine (PROAMATINE) 10 MG tablet Take 2 tablets (20 mg total) by mouth every Monday, Wednesday, and Friday. Prior to dialysis 08/20/20   Love, Evlyn Kanner, PA-C  multivitamin (RENA-VIT) TABS tablet Take 1 tablet by mouth in the morning.    [provider]  ondansetron (ZOFRAN) 8 MG tablet Take 1 tablet (8 mg total) by mouth every 8 (eight) hours as needed for nausea or vomiting. 08/17/20   Love, Evlyn Kanner, PA-C  prochlorperazine (COMPAZINE) 10 MG tablet Take 10 mg by mouth every 6 (six) hours as needed for nausea or vomiting. 08/21/18 03/29/27  Serena Croissant, MD  sevelamer carbonate (RENVELA) 800 MG tablet Take 1 tablet (800 mg total) by mouth 3 (three) times daily with meals. Patient taking differently: Take 1,600-3,200 mg by mouth See admin instructions. Take 4 tablets (3200 mg) by mouth with each meal and take 2 tablets (1600 mg) by mouth with each snack 08/20/20   Love, Evlyn Kanner, PA-C      Allergies    Patient has no known allergies.    Review of Systems   Review of Systems  All other systems reviewed and are negative.   Physical Exam Updated Vital Signs BP 121/81 (BP Location: Left Arm)   Pulse (!) 106   Temp 98.8 F (37.1 C)   Resp 16   LMP  (LMP Unknown)   SpO2 100%  Physical Exam Vitals and nursing note reviewed.  Constitutional:  General: She is not in acute distress.    Appearance: She is well-developed.  HENT:     Head: Normocephalic and atraumatic.  Eyes:     Conjunctiva/sclera: Conjunctivae normal.  Cardiovascular:     Rate and Rhythm: Normal rate and regular rhythm.  Pulmonary:     Effort: Pulmonary effort is normal. No respiratory distress.     Breath sounds: Normal breath sounds.  Abdominal:     Palpations: Abdomen is soft.     Tenderness: There is no abdominal tenderness.  Musculoskeletal:        General:  No swelling.     Cervical back: Neck supple.  Skin:    General: Skin is warm and dry.     Capillary Refill: Capillary refill takes less than 2 seconds.     Comments: Patient with internal jugular catheter placed right upper chest wall.  Bleeding trickling from tunneled site cranially.    Neurological:     Mental Status: She is alert.  Psychiatric:        Mood and Affect: Mood normal.     ED Results / Procedures / Treatments   Labs (all labs ordered are listed, but only abnormal results are displayed) Labs Reviewed  BASIC METABOLIC PANEL - Abnormal; Notable for the following components:      Result Value   Potassium 5.7 (*)    Glucose, Bld 106 (*)    BUN 68 (*)    Creatinine, Ser 14.83 (*)    GFR, Estimated 2 (*)    All other components within normal limits  CBC WITH DIFFERENTIAL/PLATELET - Abnormal; Notable for the following components:   RBC 3.30 (*)    Hemoglobin 10.6 (*)    HCT 32.9 (*)    Lymphs Abs 0.5 (*)    All other components within normal limits  TYPE AND SCREEN    EKG None  Radiology PERIPHERAL VASCULAR CATHETERIZATION Result Date: 04/03/2023 Images from the original result were not included. Patient name: Karen [name] MRN: [id number] DOB: 1955-09-10 Sex: female 04/03/2023 Pre-operative Diagnosis: occluded right arm fistula Post-operative diagnosis:  Same Surgeon:  Durene Cal Procedure Performed:  1.  Ultra sound guided access, right basilic vein x 2  2.  fistulogram  3.  Mechanical thrombectomy with penumbra device of the right basilic vein  4.  Venoplasty right basilic vein  5.  Intravascular administration of TPA (right basilic vein)  6.  Ultrasound guided access right internal jugular vein  7.  Placement of right internal jugular vein tunneled dialysis catheter using fluoroscopic guidance  8.  Conscious sedation, 150 minutes Indications: This is a 68 year old female who is scheduled for a right arm fistulogram.  Upon evaluation in the holding area, her  fistula was found to be occluded.  We talked about proceeding with thrombectomy and if unsuccessful placement of a tunneled dialysis catheter. Procedure:  The patient was identified in the holding area and taken to room 8.  The patient was then placed supine on the table and prepped and draped in the usual sterile fashion.  A time out was called.  Conscious sedation was administered with the use of IV fentanyl and Versed under continuous physician and nurse monitoring.  Heart rate, blood pressure, and oxygen saturations were continuously monitored.  Total sedation time was 150 minutes ultrasound was used to evaluate the fistula.  There was a large amount of what appeared to be subacute thrombus within the fistula.  It was clearly occluded.  Intervention: I began by getting  access of the occluded right basilic vein fistula under ultrasound guidance.  I first cannulated the vein under ultrasound guidance with a micropuncture near the antecubital crease.  A 7 French sheath was placed.  I then got a second access of near the armpit directed towards the antecubital crease under ultrasound guidance and a 6 French sheath was placed.  The patient was then fully heparinized.  I passed a Glidewire advantage into the central venous system from the 7 [place] sheath.  I then used the CAT the penumbra mechanical thrombectomy device.  I passed this into the central venous system but did not evacuate any thrombus.  Through a catheter in the axillary vein, contrast injections were performed that showed the central venous system was patent and the fistula was occluded.  Next, I inserted a 7 x 40 Mustang balloon and performed balloon maceration of the fistula from the 7 French sheath.  I then reinserted the penumbra catheter and again attempted mechanical thrombectomy with very minimal aspiration of thrombus.  At this point, I placed a 15 cm UniFuse catheter throughout the fistula from the 7 French sheath.  10 mg of tPA were  administered through the UniFuse catheter.  I waited 10 minutes.  Then performed balloon maceration of the thrombus and pushed the thrombus into the central venous system.  Next, I inserted a 7 x 40 Mustang balloon into the 6 French sheath catheter out into the brachial artery.  The balloon was slightly inflated and thrombectomy of the arterial and was performed.  I then moved the balloon to the 7 French sheath and placed this thrombus centrally.  I then repeated balloon venoplasty of the entire fistula.  I did have a thrill at this point within the graft but it was rather pulsatile.  I performed fistulogram that showed a significant amount of thrombus remaining in the fistula from the aneurysmal portion.  I again inserted the 7 balloon into the 6 French sheath across the arterial anastomosis.  I performed repeat thrombectomy and then balloon venoplasty of the proximal fistula.  Again this thrombus was pushed essentially from the 7 [place] sheath.  Venography was then performed that showed inline flow through the fistula however there was still residual thrombus within the aneurysmal portion.  I did not think that any further efforts would be successful in maintaining patency of his fistula.  He did have an adequate thrill within it but I felt that the catheter was going to be necessary in case this is not a durable repair.  The sheaths were then removed and the cannulation sites closed with a Monocryl. Attention was then turned towards the right neck and chest.  Ultrasound was used to evaluate the right internal jugular vein which is widely patent and easily compressible.  Lidocaine was used for local anesthesia.  The right internal jugular vein was cannulated under ultrasound guidance with a micropuncture needle.  A 018 wire was inserted followed by placement of a micropuncture sheath.  I then navigated a Glidewire advantage into the inferior vena cava.  The subcutaneous tract was then dilated followed placement of  a peel-away sheath.  A skin exit site was selected below the clavicle.  This was anesthetized with lidocaine.  An 11 blade was used to make a skin nick.  I then created a tunnel between the 2 incisions and brought a 23 cm tunneled catheter through the tunnel and fed into the peel-away sheath which was removed.  The catheter tip was at the right  atrium.  Both ports flushed and aspirated without difficulty.  There were no kinks within the catheter.  The catheter was filled the appropriate volumes of heparin.  The neck incision was closed with Monocryl and the catheter was sutured into position with 3-0 nylon. Impression:  #1  Mechanical venous thrombectomy of occluded right basilic vein fistula.  After thrombectomy there remained a large amount of thrombus within the fistula despite an adequate thrill.  I did not feel that this was going to be very durable and that she will likely need a new graft.  I therefore placed a 23 cm right internal jugular vein tunneled dialysis catheter  #2  The patient will return to the office in a few weeks to discuss the possibility of a redo right upper arm graft  V. Durene Cal, M.D., [facility] Office: [phone] Pager:  [phone]    Procedures Procedures    Medications Ordered in ED Medications  HYDROcodone-acetaminophen (NORCO/VICODIN) 5-325 MG per tablet 1 tablet (has no administration in time range)    ED Course/ Medical Decision Making/ A&P Clinical Course as of 04/03/23 2316  Tue Apr 03, 2023  2152 Pressure dressing with quik clot placed.  Successful hemostasis.  Will observe. [CR]  2238 Patient observed for 40 minutes without recurrence of bleeding.  Will plan for discharge with pressure dressing in place. [CR]    Clinical Course User Index [CR] Peter Garter, PA                                 Medical Decision Making Risk Prescription drug management.   This patient presents to the ED for concern of  bleeding, this involves an extensive number of treatment options, and is a complaint that carries with it a high risk of complications and morbidity.  The differential diagnosis includes coagulopathy, incisional bleeding, Hartman   Co morbidities that complicate the patient evaluation  See HPI   Additional history obtained:  Additional history obtained from EMR External records from outside source obtained and reviewed including hospital records   Lab Tests:  I Ordered, and personally interpreted labs.  The pertinent results include: No leukocytosis.  Anemia with a hemoglobin of 10.6.  Hyperkalemia 7.  Patient originally presented review of 14.3, GFR up to   Imaging Studies ordered:  N/a   Cardiac Monitoring: / EKG:  The patient was maintained on a cardiac monitor.  I personally viewed and interpreted the cardiac monitored which showed an underlying rhythm of: Sinus rhythm   Consultations Obtained:  I requested consultation with attending Dr. Dalene Seltzer who was in agreement treatment plan going forward  Problem List / ED Course / Critical interventions / Medication management  Postop issue I ordered medication including norco  Reevaluation of the patient after these medicines showed that the patient improved I have reviewed the patients home medicines and have made adjustments as needed   Social Determinants of Health:  Denies tobacco, licit drug use.   Test / Admission - Considered:  Postop problem Vitals signs significant for initial tachycardia. Otherwise within normal range and stable throughout visit. Laboratory studies significant for: See above 68 year old female presents emergency department with complaints of bleeding.  Patient reports having tunneled IJ hemodialysis catheter placed on the right side.  Was discharged around 5-6 this afternoon.  States that she was home for about 10 to 15 minutes when she noticed bleeding around the  site.  Was unable to get  the bleeding stopped at home prompting visit to the emergency department.  Was given tPA during procedure earlier today. On exam, slight trickling of blood right side where catheter was tunneled just caudally to tissue adhesive placed over the wound.  Initially tried holding pressure for 5 to 10 minutes without improvement.  Secondarily, hemostatic agent in the form of quik clot was placed with holding pressure for 5 to 10 minutes with achieved hemostasis.  Patient observed for 50+ minutes thereafter without any recurrence of bleeding.  Patient dressed in slight pressure dressing by nursing staff.  Plan for hemodialysis tomorrow.  Treatment plan discussed length with patient and she knows understanding was agreeable to said plan.  Patient over well-appearing and afebrile in no acute distress. Worrisome signs and symptoms were discussed with the patient, and the patient acknowledged understanding to return to the ED if noticed. Patient was stable upon discharge.          Final Clinical Impression(s) / ED Diagnoses Final diagnoses:  Postoperative hemorrhage involving circulatory system following circulatory system procedure    Rx / DC Orders ED Discharge Orders     None         Peter Garter, [place] 04/03/23 2316    Alvira Monday, MD 04/09/23 2249

## 2023-04-03 NOTE — Interval H&P Note (Signed)
 History and Physical Interval Note:  04/03/2023 1:04 PM  Karen [name]  has presented today for surgery, with the diagnosis of End Stage Renal Disease.  The various methods of treatment have been discussed with the patient and family. After consideration of risks, benefits and other options for treatment, the patient has consented to  Procedure(s): A/V Fistulagram (N/A) as a surgical intervention.  The patient's history has been reviewed, patient examined, no change in status, stable for surgery.  I have reviewed the patient's chart and labs.  Questions were answered to the patient's satisfaction.     Durene Cal

## 2023-04-03 NOTE — ED Notes (Signed)
 Dressing changed.

## 2023-04-04 ENCOUNTER — Encounter (HOSPITAL_COMMUNITY): Payer: Self-pay | Admitting: Surgery

## 2023-04-07 ENCOUNTER — Other Ambulatory Visit: Payer: Self-pay | Admitting: Cardiovascular Disease

## 2023-04-10 ENCOUNTER — Ambulatory Visit: Attending: Cardiovascular Disease | Admitting: Cardiovascular Disease

## 2023-04-10 ENCOUNTER — Encounter: Payer: Self-pay | Admitting: Cardiovascular Disease

## 2023-04-10 VITALS — BP 112/78 | HR 80 | Ht 61.0 in | Wt 182.4 lb

## 2023-04-10 DIAGNOSIS — E782 Mixed hyperlipidemia: Secondary | ICD-10-CM

## 2023-04-10 DIAGNOSIS — I480 Paroxysmal atrial fibrillation: Secondary | ICD-10-CM | POA: Diagnosis not present

## 2023-04-10 DIAGNOSIS — R0789 Other chest pain: Secondary | ICD-10-CM

## 2023-04-10 DIAGNOSIS — R0609 Other forms of dyspnea: Secondary | ICD-10-CM

## 2023-04-10 NOTE — Progress Notes (Signed)
 04/10/2023 [facility]   March 20, 1955  [id number]  Primary Physician Renaye Rakers, MD Primary Cardiologist: Runell Gess MD Nicholes Calamity, [place]  HPI:  Karen [name] is a 68 y.o.   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  I last saw her in the office 07/15/2021.  Her cardiac risk factor profile is notable for hypertension but otherwise are negative. For the last 3 weeks prior to her last office visit she had noticed substernal chest pain radiating to both upper extremities occurring on a daily basis. She does have reflux but she says the symptoms are different. I ordered a Myoview stress test which was entirely normal. Since I saw her in the office a year ago her chest pain has resolved. She has gone on hemodialysis one year ago. 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 She did have paroxysmal atrial fibrillation demonstrated in the emergency room after dialysis converting with IV diltiazem.    She was admitted to [facility] 01/07/2018 with atypical chest pain.  She ruled out for myocardial infarction.  Her d-dimer was mildly elevated and a CTA was negative for PE.  She was seen by Dr. Elease Hashimoto in consultation who felt that her symptoms were most compatible with idiopathic pericarditis.  He did hear a rub.  Her diltiazem was discontinued as was her aspirin.   She had a Myoview stress test performed 05/01/2019 was nonischemic and an event monitor showed present predominantly sinus rhythm with short runs of PSVT.  She did have a drug-coated stent placed in her fistula recently by Dr. Myra Gianotti.   Since I saw her in the office over a year and a half ago she is fortunately had to put her 82 year old husband in a nursing home.  She complains of fatigue and shortness of breath but denies chest pain.  She is still getting hemodialysis.  Dr. Myra Gianotti recently intervened on an  occluded AV fistula and putting a dialysis catheter.   Current Meds  Medication Sig   atorvastatin (LIPITOR) 10 MG tablet Take 1 tablet (10 mg total) by mouth every evening.   clobetasol cream (TEMOVATE) 0.05 % Apply 1 application topically daily as needed (for rash).    diltiazem (CARDIZEM CD) 120 MG 24 hr capsule TAKE 1 CAPSULE (120 MG TOTAL) BY MOUTH EVERY TUESDAY, THURSDAY, SATURDAY, AND SUNDAY AT 6 PM.   diltiazem (CARDIZEM) 30 MG tablet TAKE 1 TABLET (30 MG TOTAL) BY MOUTH EVERY MONDAY, WEDNESDAY, AND FRIDAY. (Patient taking differently: Take 30 mg by mouth every Monday, Wednesday, and Friday. After dialysis)   Doxercalciferol (HECTOROL IV) Doxercalciferol (Hectorol)   ethyl chloride spray Apply 1 application topically every Monday, Wednesday, and Friday with hemodialysis.   fluticasone (FLONASE) 50 MCG/ACT nasal spray Place 1 spray into both nostrils daily as needed for allergies.   lidocaine-prilocaine (EMLA) cream Apply 1 application topically See admin instructions. Apply topically Monday, Wednesday and Friday before dialysis   midodrine (PROAMATINE) 10 MG tablet Take 2 tablets (20 mg total) by mouth every Monday, Wednesday, and Friday. Prior to dialysis   multivitamin (RENA-VIT) TABS tablet Take 1 tablet by mouth in the morning.   ondansetron (ZOFRAN) 8 MG tablet Take 1 tablet (8 mg total) by mouth every 8 (eight) hours as needed for nausea or vomiting.   prochlorperazine (COMPAZINE) 10 MG tablet Take 10 mg by mouth every 6 (six) hours as needed for  nausea or vomiting.   sevelamer carbonate (RENVELA) 800 MG tablet Take 1 tablet (800 mg total) by mouth 3 (three) times daily with meals. (Patient taking differently: Take 1,600-3,200 mg by mouth See admin instructions. Take 4 tablets (3200 mg) by mouth with each meal and take 2 tablets (1600 mg) by mouth with each snack)   Current Facility-Administered Medications for the 04/10/23 encounter (Office Visit) with Runell Gess, MD   Medication   0.9 %  sodium chloride infusion   0.9 %  sodium chloride infusion   sodium chloride flush (NS) 0.9 % injection 3 mL   sodium chloride flush (NS) 0.9 % injection 3 mL   sodium chloride flush (NS) 0.9 % injection 3 mL     No Known Allergies  Social History   Socioeconomic History   Marital status: Married    Spouse name: Not on file   Number of children: 2   Years of education: Not on file   Highest education level: Not on file  Occupational History   Occupation: SHERIFF'S OFFICE    Employer: [employer]  Tobacco Use   Smoking status: Never   Smokeless tobacco: Never  Vaping Use   Vaping status: Never Used  Substance and Sexual Activity   Alcohol use: No   Drug use: No   Sexual activity: Not on file    Comment: Hysterectomy  Other Topics Concern   Not on file  Social History Narrative   Not on file   Social Drivers of Health   Financial Resource Strain: Not on file  Food Insecurity: Not on file  Transportation Needs: Not on file  Physical Activity: Not on file  Stress: Not on file  Social Connections: Not on file  Intimate Partner Violence: Not on file     Review of Systems: General: negative for chills, fever, night sweats or weight changes.  Cardiovascular: negative for chest pain, dyspnea on exertion, edema, orthopnea, palpitations, paroxysmal nocturnal dyspnea or shortness of breath Dermatological: negative for rash Respiratory: negative for cough or wheezing Urologic: negative for hematuria Abdominal: negative for nausea, vomiting, diarrhea, bright red blood per rectum, melena, or hematemesis Neurologic: negative for visual changes, syncope, or dizziness All other systems reviewed and are otherwise negative except as noted above.    Blood pressure 112/78, pulse 80, height 5\' 1"  (1.549 m), weight 182 lb 6.4 oz (82.7 kg), SpO2 98%.  General appearance: alert and no distress Neck: no adenopathy, no carotid bruit, no JVD, supple,  symmetrical, trachea midline, and thyroid not enlarged, symmetric, no tenderness/mass/nodules Lungs: clear to auscultation bilaterally Heart: regular rate and rhythm, S1, S2 normal, no murmur, click, rub or gallop Extremities: extremities normal, atraumatic, no cyanosis or edema Pulses: 2+ and symmetric Skin: Skin color, texture, turgor normal. No rashes or lesions Neurologic: Grossly normal  EKG not performed today      ASSESSMENT AND PLAN:   Paroxysmal atrial fibrillation (HCC) History of PAF maintaining sinus rhythm not on oral anticoagulation at this time followed by the A-fib clinic in the past.  Chest pain History of atypical chest pain in the past with multiple negative Myoview stress test.  Currently pain-free.  Hyperlipidemia History of hyperlipidemia on atorvastatin 10 mg a day with lipid profile performed 07/28/2021 revealing total cholesterol 142, LDL 69 HDL 58.     Runell Gess MD FACP,FACC,FAHA, [facility] 04/10/2023 12:34 PM

## 2023-04-10 NOTE — Assessment & Plan Note (Signed)
 History of hyperlipidemia on atorvastatin 10 mg a day with lipid profile performed 07/28/2021 revealing total cholesterol 142, LDL 69 HDL 58.

## 2023-04-10 NOTE — Assessment & Plan Note (Signed)
 History of atypical chest pain in the past with multiple negative Myoview stress test.  Currently pain-free.

## 2023-04-10 NOTE — Patient Instructions (Signed)
 Medication Instructions:  Your physician recommends that you continue on your current medications as directed. Please refer to the Current Medication list given to you today.  *If you need a refill on your cardiac medications before your next appointment, please call your pharmacy*   Testing/Procedures: Your physician has requested that you have an echocardiogram. Echocardiography is a painless test that uses sound waves to create images of your heart. It provides your doctor with information about the size and shape of your heart and how well your heart's chambers and valves are working. This procedure takes approximately one hour. There are no restrictions for this procedure. Please do NOT wear cologne, perfume, aftershave, or lotions (deodorant is allowed). Please arrive 15 minutes prior to your appointment time.  Please note: We ask at that you not bring children with you during ultrasound (echo/ vascular) testing. Due to room size and safety concerns, children are not allowed in the ultrasound rooms during exams. Our front office staff cannot provide observation of children in our lobby area while testing is being conducted. An adult accompanying a patient to their appointment will only be allowed in the ultrasound room at the discretion of the ultrasound technician under special circumstances. We apologize for any inconvenience.    Follow-Up: At [facility], you and your health needs are our priority.  As part of our continuing mission to provide you with exceptional heart care, we have created designated Provider Care Teams.  These Care Teams include your primary Cardiologist (physician) and Advanced Practice Providers (APPs -  Physician Assistants and Nurse Practitioners) who all work together to provide you with the care you need, when you need it.  We recommend signing up for the patient portal called "MyChart".  Sign up information is provided on this After Visit Summary.   MyChart is used to connect with patients for Virtual Visits (Telemedicine).  Patients are able to view lab/test results, encounter notes, upcoming appointments, etc.  Non-urgent messages can be sent to your provider as well.   To learn more about what you can do with MyChart, go to [email or web address].    Your next appointment:   6 month(s)  Provider:   Marjie Skiff, PA-C, Azalee Course, PA-C, Bernadene Person, NP, or Reather Littler, NP       Then, Nanetta Batty, MD  will plan to see you again in 12 month(s).    Other Instructions   1st Floor: - Lobby - Registration  - Pharmacy  - Lab - Cafe  2nd Floor: - PV Lab - Diagnostic Testing (echo, CT, nuclear med)  3rd Floor: - Vacant  4th Floor: - TCTS (cardiothoracic surgery) - AFib Clinic - Structural Heart Clinic - Vascular Surgery  - Vascular Ultrasound  5th Floor: - HeartCare Cardiology (general and EP) - Clinical Pharmacy for coumadin, hypertension, lipid, weight-loss medications, and med management appointments    Valet parking services will be available as well.

## 2023-04-10 NOTE — Assessment & Plan Note (Signed)
 History of PAF maintaining sinus rhythm not on oral anticoagulation at this time followed by the A-fib clinic in the past.

## 2023-04-12 ENCOUNTER — Ambulatory Visit: Admitting: Vascular Surgery

## 2023-04-16 ENCOUNTER — Other Ambulatory Visit: Payer: Self-pay

## 2023-04-16 ENCOUNTER — Encounter: Payer: Self-pay | Admitting: Surgery

## 2023-04-16 ENCOUNTER — Ambulatory Visit (INDEPENDENT_AMBULATORY_CARE_PROVIDER_SITE_OTHER): Admitting: Surgery

## 2023-04-16 VITALS — BP 130/80 | HR 87 | Temp 98.3°F | Ht 61.0 in | Wt 185.0 lb

## 2023-04-16 DIAGNOSIS — N186 End stage renal disease: Secondary | ICD-10-CM | POA: Diagnosis not present

## 2023-04-16 DIAGNOSIS — Z992 Dependence on renal dialysis: Secondary | ICD-10-CM | POA: Diagnosis not present

## 2023-04-16 NOTE — H&P (View-Only) (Signed)
 [facility] [place]  Patient name: Karen [name] MRN: [id number] DOB: [birth year] Sex: female   REASON FOR VISIT:    Follow-up  HISOTRY OF PRESENT ILLNESS:    Karen [name] is a 68 y.o. female with history of multiple access sites in both arms.  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  Recently she came in for maintenance fistulogram however her fistula on the right arm was found to be occluded.  I perform mechanical thrombectomy.  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  She is back today for follow-up.   PAST MEDICAL HISTORY:   Past Medical History:  Diagnosis Date   Anemia    Arthritis    knees   Breast cancer (HCC) 2019   Right Breast Cancer   Cancer [facility])    Chest pain Jan 2016   low risk Myoview    CHF (congestive heart failure) (HCC)    Colitis 12/08/2014   [facility]- focal moderate active colitis   Complication of anesthesia 2005   difficulty remembering for a while and waking up   Constipation    Dysrhythmia    h/o A-Fib   Elevated LFTs 2016   [facility]   ESRD on dialysis [facility]) April 2016   MWF [facility]   Fundic gland polyps of stomach, benign    GERD (gastroesophageal reflux disease)    Heart murmur Nov 2015   Aortic scleosis- no stenosis   Hemodialysis patient (HCC)    Hypertension    Shortness of breath dyspnea    with exertion     FAMILY HISTORY:   Family History  Problem Relation Age of Onset   Kidney disease Mother    Heart attack Father    Kidney disease Father    Diabetes Sister    Hyperlipidemia Sister    Hypertension Sister    Kidney disease Sister        x2    SOCIAL HISTORY:   Social History   Tobacco Use   Smoking status: Never   Smokeless tobacco: Never  Substance Use Topics   Alcohol use: No     ALLERGIES:    No Known Allergies   CURRENT MEDICATIONS:   Current Outpatient Medications  Medication Sig Dispense Refill   atorvastatin (LIPITOR) 10 MG tablet Take 1 tablet (10 mg total) by mouth every evening. 30 tablet 0   clobetasol cream (TEMOVATE) 0.05 % Apply 1 application topically daily as needed (for rash).      diltiazem (CARDIZEM CD) 120 MG 24 hr capsule TAKE 1 CAPSULE (120 MG TOTAL) BY MOUTH EVERY TUESDAY, THURSDAY, SATURDAY, AND SUNDAY AT 6 PM. 52 capsule 6   diltiazem (CARDIZEM) 30 MG tablet TAKE 1 TABLET (30 MG TOTAL) BY MOUTH EVERY MONDAY, WEDNESDAY, AND FRIDAY. (Patient taking differently: Take 30 mg by mouth every Monday, Wednesday, and Friday. After dialysis) 36 tablet 1   Doxercalciferol (HECTOROL IV) Doxercalciferol (Hectorol)     ethyl chloride spray Apply 1 application topically every Monday, Wednesday, and Friday with hemodialysis.     fluticasone (FLONASE) 50 MCG/ACT nasal spray Place 1 spray into both nostrils daily as needed for allergies.     lidocaine-prilocaine (EMLA) cream Apply 1 application topically See admin instructions. Apply topically Monday, Wednesday and Friday before dialysis  6   midodrine (PROAMATINE) 10  MG tablet Take 2 tablets (20 mg total) by mouth every Monday, Wednesday, and Friday. Prior to dialysis 28 tablet 0   multivitamin (RENA-VIT) TABS tablet Take 1 tablet by mouth in the morning.     ondansetron (ZOFRAN) 8 MG tablet Take 1 tablet (8 mg total) by mouth every 8 (eight) hours as needed for nausea or vomiting. 20 tablet 0   prochlorperazine (COMPAZINE) 10 MG tablet Take 10 mg by mouth every 6 (six) hours as needed for nausea or vomiting.     sevelamer carbonate (RENVELA) 800 MG tablet Take 1 tablet (800 mg total) by mouth 3 (three) times daily with meals. (Patient taking differently: Take 1,600-3,200 mg by mouth See admin instructions. Take 4 tablets (3200 mg) by mouth with each meal and take 2 tablets (1600 mg) by mouth with each snack)     Current  Facility-Administered Medications  Medication Dose Route Frequency Provider Last Rate Last Admin   0.9 %  sodium chloride infusion  250 mL Intravenous PRN Nada Libman, MD       0.9 %  sodium chloride infusion  250 mL Intravenous PRN Victorino Sparrow, MD       sodium chloride flush (NS) 0.9 % injection 3 mL  3 mL Intravenous Q12H Nada Libman, MD       sodium chloride flush (NS) 0.9 % injection 3 mL  3 mL Intravenous Q12H Victorino Sparrow, MD       sodium chloride flush (NS) 0.9 % injection 3 mL  3 mL Intravenous PRN Nada Libman, MD        REVIEW OF SYSTEMS:   [X]  denotes positive finding, [ ]  denotes negative finding Cardiac  Comments:  Chest pain or chest pressure:    Shortness of breath upon exertion:    Short of breath when lying flat:    Irregular heart rhythm:        Vascular    Pain in calf, thigh, or hip brought on by ambulation:    Pain in feet at night that wakes you up from your sleep:     Blood clot in your veins:    Leg swelling:         Pulmonary    Oxygen at home:    Productive cough:     Wheezing:         Neurologic    Sudden weakness in arms or legs:     Sudden numbness in arms or legs:     Sudden onset of difficulty speaking or slurred speech:    Temporary loss of vision in one eye:     Problems with dizziness:         Gastrointestinal    Blood in stool:     Vomited blood:         Genitourinary    Burning when urinating:     Blood in urine:        Psychiatric    Major depression:         Hematologic    Bleeding problems:    Problems with blood clotting too easily:        Skin    Rashes or ulcers:        Constitutional    Fever or chills:      PHYSICAL EXAM:   Vitals:   04/16/23 0851  BP: 130/80  Pulse: 87  Temp: 98.3 F (36.8 C)  SpO2: 99%  Weight: 185 lb (83.9 kg)  Height:  5\' 1"  (1.549 m)    GENERAL: The patient is a well-nourished female, in no acute distress. The vital signs are documented above. CARDIAC: There  is a regular rate and rhythm.  VASCULAR: Palpable thrill in right upper arm fistula.  I looked at this with SonoSite and there is still a large amount of residual thrombus PULMONARY: Non-labored respirations  MUSCULOSKELETAL: There are no major deformities or cyanosis. NEUROLOGIC: No focal weakness or paresthesias are detected. SKIN: There are no ulcers or rashes noted. PSYCHIATRIC: The patient has a normal affect.  STUDIES:   None  MEDICAL ISSUES:   ESRD: I was pleasantly surprised that her fistula remains patent.  When I looked at it with ultrasound, there was a large amount of residual thrombus and an ectatic/aneurysmal section.  I proposed going back to the operating room for revision of her fistula.  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  I will also repeat balloon venoplasty in case I was unable to take care of the stenosis at the time of her fistulogram.  This will be scheduled for Thursday, April 3.  She will keep her catheter in place.    Charlena Cross, MD, FACS [facility] [phone] Pager [phone]

## 2023-04-16 NOTE — Progress Notes (Signed)
 [facility] [place]  Patient name: Karen [name] MRN: [id number] DOB: [birth year] Sex: female   REASON FOR VISIT:    Follow-up  HISOTRY OF PRESENT ILLNESS:    Karen [name] is a 68 y.o. female with history of multiple access sites in both arms.  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  Recently she came in for maintenance fistulogram however her fistula on the right arm was found to be occluded.  I perform mechanical thrombectomy.  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  She is back today for follow-up.   PAST MEDICAL HISTORY:   Past Medical History:  Diagnosis Date   Anemia    Arthritis    knees   Breast cancer (HCC) 2019   Right Breast Cancer   Cancer [facility])    Chest pain Jan 2016   low risk Myoview    CHF (congestive heart failure) (HCC)    Colitis 12/08/2014   [facility]- focal moderate active colitis   Complication of anesthesia 2005   difficulty remembering for a while and waking up   Constipation    Dysrhythmia    h/o A-Fib   Elevated LFTs 2016   [facility]   ESRD on dialysis [facility]) April 2016   MWF [facility]   Fundic gland polyps of stomach, benign    GERD (gastroesophageal reflux disease)    Heart murmur Nov 2015   Aortic scleosis- no stenosis   Hemodialysis patient (HCC)    Hypertension    Shortness of breath dyspnea    with exertion     FAMILY HISTORY:   Family History  Problem Relation Age of Onset   Kidney disease Mother    Heart attack Father    Kidney disease Father    Diabetes Sister    Hyperlipidemia Sister    Hypertension Sister    Kidney disease Sister        x2    SOCIAL HISTORY:   Social History   Tobacco Use   Smoking status: Never   Smokeless tobacco: Never  Substance Use Topics   Alcohol use: No     ALLERGIES:    No Known Allergies   CURRENT MEDICATIONS:   Current Outpatient Medications  Medication Sig Dispense Refill   atorvastatin (LIPITOR) 10 MG tablet Take 1 tablet (10 mg total) by mouth every evening. 30 tablet 0   clobetasol cream (TEMOVATE) 0.05 % Apply 1 application topically daily as needed (for rash).      diltiazem (CARDIZEM CD) 120 MG 24 hr capsule TAKE 1 CAPSULE (120 MG TOTAL) BY MOUTH EVERY TUESDAY, THURSDAY, SATURDAY, AND SUNDAY AT 6 PM. 52 capsule 6   diltiazem (CARDIZEM) 30 MG tablet TAKE 1 TABLET (30 MG TOTAL) BY MOUTH EVERY MONDAY, WEDNESDAY, AND FRIDAY. (Patient taking differently: Take 30 mg by mouth every Monday, Wednesday, and Friday. After dialysis) 36 tablet 1   Doxercalciferol (HECTOROL IV) Doxercalciferol (Hectorol)     ethyl chloride spray Apply 1 application topically every Monday, Wednesday, and Friday with hemodialysis.     fluticasone (FLONASE) 50 MCG/ACT nasal spray Place 1 spray into both nostrils daily as needed for allergies.     lidocaine-prilocaine (EMLA) cream Apply 1 application topically See admin instructions. Apply topically Monday, Wednesday and Friday before dialysis  6   midodrine (PROAMATINE) 10  MG tablet Take 2 tablets (20 mg total) by mouth every Monday, Wednesday, and Friday. Prior to dialysis 28 tablet 0   multivitamin (RENA-VIT) TABS tablet Take 1 tablet by mouth in the morning.     ondansetron (ZOFRAN) 8 MG tablet Take 1 tablet (8 mg total) by mouth every 8 (eight) hours as needed for nausea or vomiting. 20 tablet 0   prochlorperazine (COMPAZINE) 10 MG tablet Take 10 mg by mouth every 6 (six) hours as needed for nausea or vomiting.     sevelamer carbonate (RENVELA) 800 MG tablet Take 1 tablet (800 mg total) by mouth 3 (three) times daily with meals. (Patient taking differently: Take 1,600-3,200 mg by mouth See admin instructions. Take 4 tablets (3200 mg) by mouth with each meal and take 2 tablets (1600 mg) by mouth with each snack)     Current  Facility-Administered Medications  Medication Dose Route Frequency Provider Last Rate Last Admin   0.9 %  sodium chloride infusion  250 mL Intravenous PRN Nada Libman, MD       0.9 %  sodium chloride infusion  250 mL Intravenous PRN Victorino Sparrow, MD       sodium chloride flush (NS) 0.9 % injection 3 mL  3 mL Intravenous Q12H Nada Libman, MD       sodium chloride flush (NS) 0.9 % injection 3 mL  3 mL Intravenous Q12H Victorino Sparrow, MD       sodium chloride flush (NS) 0.9 % injection 3 mL  3 mL Intravenous PRN Nada Libman, MD        REVIEW OF SYSTEMS:   [X]  denotes positive finding, [ ]  denotes negative finding Cardiac  Comments:  Chest pain or chest pressure:    Shortness of breath upon exertion:    Short of breath when lying flat:    Irregular heart rhythm:        Vascular    Pain in calf, thigh, or hip brought on by ambulation:    Pain in feet at night that wakes you up from your sleep:     Blood clot in your veins:    Leg swelling:         Pulmonary    Oxygen at home:    Productive cough:     Wheezing:         Neurologic    Sudden weakness in arms or legs:     Sudden numbness in arms or legs:     Sudden onset of difficulty speaking or slurred speech:    Temporary loss of vision in one eye:     Problems with dizziness:         Gastrointestinal    Blood in stool:     Vomited blood:         Genitourinary    Burning when urinating:     Blood in urine:        Psychiatric    Major depression:         Hematologic    Bleeding problems:    Problems with blood clotting too easily:        Skin    Rashes or ulcers:        Constitutional    Fever or chills:      PHYSICAL EXAM:   Vitals:   04/16/23 0851  BP: 130/80  Pulse: 87  Temp: 98.3 F (36.8 C)  SpO2: 99%  Weight: 185 lb (83.9 kg)  Height:  5\' 1"  (1.549 m)    GENERAL: The patient is a well-nourished female, in no acute distress. The vital signs are documented above. CARDIAC: There  is a regular rate and rhythm.  VASCULAR: Palpable thrill in right upper arm fistula.  I looked at this with SonoSite and there is still a large amount of residual thrombus PULMONARY: Non-labored respirations  MUSCULOSKELETAL: There are no major deformities or cyanosis. NEUROLOGIC: No focal weakness or paresthesias are detected. SKIN: There are no ulcers or rashes noted. PSYCHIATRIC: The patient has a normal affect.  STUDIES:   None  MEDICAL ISSUES:   ESRD: I was pleasantly surprised that her fistula remains patent.  When I looked at it with ultrasound, there was a large amount of residual thrombus and an ectatic/aneurysmal section.  I proposed going back to the operating room for revision of her fistula.  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  I will also repeat balloon venoplasty in case I was unable to take care of the stenosis at the time of her fistulogram.  This will be scheduled for Thursday, April 3.  She will keep her catheter in place.    Charlena Cross, MD, FACS [facility] [phone] Pager [phone]

## 2023-04-18 ENCOUNTER — Other Ambulatory Visit: Payer: Self-pay

## 2023-04-18 ENCOUNTER — Encounter (HOSPITAL_COMMUNITY): Payer: Self-pay | Admitting: Surgery

## 2023-04-18 NOTE — Progress Notes (Signed)
 PCP - Renaye Rakers, MD  Cardiologist - Runell Gess, MD Hematology/Oncology - Hematology and Oncology Serena Croissant, MD Electrophysiology - Nurse Practitioner ( not sure of name but follows up due to A fib)  PPM/ICD - denies Device Orders - n/a Rep Notified - n/a  Chest x-ray - 04-15-19 EKG - 04-03-23 Stress Test - 05-01-19 ECHO - 04-10-23 Cardiac Cath -   CPAP - denies  Dm -denies  Blood Thinner Instructions: denies Aspirin Instructions: n/a  ERAS Protcol - NPO  COVID TEST- n/a  Anesthesia review: yes  Patient verbally denies any shortness of breath, fever, cough and chest pain during phone call   -------------  SDW INSTRUCTIONS given:  Your procedure is scheduled on April 19, 2023.  Report to [facility] Main Entrance "A" at 7:00 A.M., and check in at the Admitting office.  Call this number if you have problems the morning of surgery:  [phone]   Remember:  Do not eat or drink  after midnight the night before your surgery      Take these medicines the morning of surgery with A SIP OF WATER  fluticasone (FLONASE)  ondansetron (ZOFRAN)  prochlorperazine (COMPAZINE)     As of today, STOP taking any Aspirin (unless otherwise instructed by your surgeon) Aleve, Naproxen, Ibuprofen, Motrin, Advil, Goody's, BC's, all herbal medications, fish oil, and all vitamins.                      Do not wear jewelry, make up, or nail polish            Do not wear lotions, powders, perfumes/colognes, or deodorant.            Do not shave 48 hours prior to surgery.  Men may shave face and neck.            Do not bring valuables to the hospital.            [facility] is not responsible for any belongings or valuables.  Do NOT Smoke (Tobacco/Vaping) 24 hours prior to your procedure If you use a CPAP at night, you may bring all equipment for your overnight stay.   Contacts, glasses, dentures or bridgework may not be worn into surgery.      For patients admitted to the  hospital, discharge time will be determined by your treatment team.   Patients discharged the day of surgery will not be allowed to drive home, and someone needs to stay with them for 24 hours.    Special instructions:   [facility]- Preparing For Surgery  Before surgery, you can play an important role. Because skin is not sterile, your skin needs to be as free of germs as possible. You can reduce the number of germs on your skin by washing with CHG (chlorahexidine gluconate) Soap before surgery.  CHG is an antiseptic cleaner which kills germs and bonds with the skin to continue killing germs even after washing.    Oral Hygiene is also important to reduce your risk of infection.  Remember - BRUSH YOUR TEETH THE MORNING OF SURGERY WITH YOUR REGULAR TOOTHPASTE  Please do not use if you have an allergy to CHG or antibacterial soaps. If your skin becomes reddened/irritated stop using the CHG.  Do not shave (including legs and underarms) for at least 48 hours prior to first CHG shower. It is OK to shave your face.  Please follow these instructions carefully.   Shower the [employer]  SURGERY and the MORNING OF SURGERY with DIAL Soap.   Pat yourself dry with a CLEAN TOWEL.  Wear CLEAN PAJAMAS to bed the night before surgery  Place CLEAN SHEETS on your bed the night of your first shower and DO NOT SLEEP WITH PETS.   Day of Surgery: Please shower morning of surgery  Wear Clean/Comfortable clothing the morning of surgery Do not apply any deodorants/lotions.   Remember to brush your teeth WITH YOUR REGULAR TOOTHPASTE.   Questions were answered. Patient verbalized understanding of instructions.

## 2023-04-19 ENCOUNTER — Ambulatory Visit (HOSPITAL_COMMUNITY): Admitting: Physician Assistant

## 2023-04-19 ENCOUNTER — Ambulatory Visit (HOSPITAL_COMMUNITY)
Admission: RE | Admit: 2023-04-19 | Discharge: 2023-04-19 | Disposition: A | Discharge status: Home / Self Care | Attending: Surgery | Admitting: Surgery

## 2023-04-19 ENCOUNTER — Encounter (HOSPITAL_COMMUNITY): Payer: Self-pay

## 2023-04-19 ENCOUNTER — Other Ambulatory Visit (HOSPITAL_COMMUNITY): Payer: Self-pay

## 2023-04-19 ENCOUNTER — Other Ambulatory Visit: Payer: Self-pay

## 2023-04-19 ENCOUNTER — Encounter (HOSPITAL_COMMUNITY): Payer: Self-pay | Admitting: Surgery

## 2023-04-19 ENCOUNTER — Encounter (HOSPITAL_COMMUNITY)
Admission: RE | Disposition: A | Discharge status: Home / Self Care | Payer: Self-pay | Source: Home / Self Care | Attending: Surgery

## 2023-04-19 ENCOUNTER — Ambulatory Visit (HOSPITAL_COMMUNITY)

## 2023-04-19 ENCOUNTER — Ambulatory Visit (HOSPITAL_COMMUNITY): Admitting: Internal Medicine

## 2023-04-19 ENCOUNTER — Ambulatory Visit (HOSPITAL_BASED_OUTPATIENT_CLINIC_OR_DEPARTMENT_OTHER): Admitting: Physician Assistant

## 2023-04-19 DIAGNOSIS — I509 Heart failure, unspecified: Secondary | ICD-10-CM | POA: Insufficient documentation

## 2023-04-19 DIAGNOSIS — Z853 Personal history of malignant neoplasm of breast: Secondary | ICD-10-CM | POA: Insufficient documentation

## 2023-04-19 DIAGNOSIS — I132 Hypertensive heart and chronic kidney disease with heart failure and with stage 5 chronic kidney disease, or end stage renal disease: Secondary | ICD-10-CM | POA: Diagnosis not present

## 2023-04-19 DIAGNOSIS — Z992 Dependence on renal dialysis: Secondary | ICD-10-CM | POA: Diagnosis not present

## 2023-04-19 DIAGNOSIS — I4891 Unspecified atrial fibrillation: Secondary | ICD-10-CM | POA: Insufficient documentation

## 2023-04-19 DIAGNOSIS — K219 Gastro-esophageal reflux disease without esophagitis: Secondary | ICD-10-CM | POA: Insufficient documentation

## 2023-04-19 DIAGNOSIS — N186 End stage renal disease: Secondary | ICD-10-CM

## 2023-04-19 HISTORY — PX: PATCH ANGIOPLASTY: SHX6230

## 2023-04-19 HISTORY — PX: REVISON OF ARTERIOVENOUS FISTULA: SHX6074

## 2023-04-19 LAB — POCT I-STAT, CHEM 8
BUN: 13 mg/dL (ref 8–23)
Calcium, Ion: 1.09 mmol/L — ABNORMAL LOW (ref 1.15–1.40)
Chloride: 101 mmol/L (ref 98–111)
Creatinine, Ser: 5.7 mg/dL — ABNORMAL HIGH (ref 0.44–1.00)
Glucose, Bld: 86 mg/dL (ref 70–99)
HCT: 29 % — ABNORMAL LOW (ref 36.0–46.0)
Hemoglobin: 9.9 g/dL — ABNORMAL LOW (ref 12.0–15.0)
Potassium: 3.8 mmol/L (ref 3.5–5.1)
Sodium: 141 mmol/L (ref 135–145)
TCO2: 29 mmol/L (ref 22–32)

## 2023-04-19 SURGERY — REVISON OF ARTERIOVENOUS FISTULA
Anesthesia: General | Site: Arm Upper | Laterality: Right

## 2023-04-19 MED ORDER — HEPARIN 6000 UNIT IRRIGATION SOLUTION
Status: AC
  Filled 2023-04-19: qty 500

## 2023-04-19 MED ORDER — ORAL CARE MOUTH RINSE: 15.0000 mL | Freq: Once | OROMUCOSAL | Status: AC

## 2023-04-19 MED ORDER — SODIUM CHLORIDE 0.9% FLUSH: 3.0000 mL | Freq: Two times a day (BID) | INTRAVENOUS | Status: DC

## 2023-04-19 MED ORDER — SODIUM CHLORIDE 0.9 % IV SOLN: INTRAVENOUS | Status: DC

## 2023-04-19 MED ORDER — CHLORHEXIDINE GLUCONATE 4 % EX SOLN: 60.0000 mL | Freq: Once | CUTANEOUS | Status: DC

## 2023-04-19 MED ORDER — PROPOFOL 10 MG/ML IV BOLUS
INTRAVENOUS | Status: AC
  Filled 2023-04-19: qty 20

## 2023-04-19 MED ORDER — MIDAZOLAM HCL 2 MG/2ML IJ SOLN
INTRAMUSCULAR | Status: DC | PRN
  Administered 2023-04-19: 2 mg via INTRAVENOUS

## 2023-04-19 MED ORDER — LACTATED RINGERS IV SOLN: INTRAVENOUS | Status: DC

## 2023-04-19 MED ORDER — PHENYLEPHRINE 80 MCG/ML (10ML) SYRINGE FOR IV PUSH (FOR BLOOD PRESSURE SUPPORT)
PREFILLED_SYRINGE | INTRAVENOUS | Status: DC | PRN
Start: 2023-04-19 — End: 2023-04-19
  Administered 2023-04-19: 160 ug via INTRAVENOUS
  Administered 2023-04-19 (×2): 80 ug via INTRAVENOUS
  Administered 2023-04-19 (×2): 160 ug via INTRAVENOUS

## 2023-04-19 MED ORDER — OXYCODONE-ACETAMINOPHEN 5-325 MG PO TABS
1.0000 | ORAL_TABLET | Freq: Four times a day (QID) | ORAL | 0 refills | Status: DC | PRN
  Filled 2023-04-19: qty 30, 7d supply, fill #0

## 2023-04-19 MED ORDER — PROTAMINE SULFATE 10 MG/ML IV SOLN
INTRAVENOUS | Status: DC | PRN
  Administered 2023-04-19: 25 mg via INTRAVENOUS

## 2023-04-19 MED ORDER — 0.9 % SODIUM CHLORIDE (POUR BTL) OPTIME
TOPICAL | Status: DC | PRN
  Administered 2023-04-19: 1000 mL

## 2023-04-19 MED ORDER — SODIUM CHLORIDE 0.9% FLUSH: 3.0000 mL | INTRAVENOUS | Status: DC | PRN

## 2023-04-19 MED ORDER — MIDAZOLAM HCL 2 MG/2ML IJ SOLN
INTRAMUSCULAR | Status: AC
  Filled 2023-04-19: qty 2

## 2023-04-19 MED ORDER — SURGIFLO WITH THROMBIN (HEMOSTATIC MATRIX KIT) OPTIME
TOPICAL | Status: DC | PRN
Start: 2023-04-19 — End: 2023-04-19
  Administered 2023-04-19: 2 via TOPICAL

## 2023-04-19 MED ORDER — CEFAZOLIN SODIUM-DEXTROSE 2-4 GM/100ML-% IV SOLN
2.0000 g | INTRAVENOUS | Status: AC
  Administered 2023-04-19: 2 g via INTRAVENOUS
  Filled 2023-04-19: qty 100

## 2023-04-19 MED ORDER — DEXAMETHASONE SODIUM PHOSPHATE 10 MG/ML IJ SOLN
INTRAMUSCULAR | Status: AC
  Filled 2023-04-19: qty 1

## 2023-04-19 MED ORDER — PHENYLEPHRINE HCL-NACL 20-0.9 MG/250ML-% IV SOLN
INTRAVENOUS | Status: DC | PRN
  Administered 2023-04-19: 50 ug/min via INTRAVENOUS

## 2023-04-19 MED ORDER — PHENYLEPHRINE 80 MCG/ML (10ML) SYRINGE FOR IV PUSH (FOR BLOOD PRESSURE SUPPORT)
PREFILLED_SYRINGE | INTRAVENOUS | Status: AC
  Filled 2023-04-19: qty 10

## 2023-04-19 MED ORDER — CHLORHEXIDINE GLUCONATE 0.12 % MT SOLN
15.0000 mL | Freq: Once | OROMUCOSAL | Status: AC
  Administered 2023-04-19: 15 mL via OROMUCOSAL
  Filled 2023-04-19: qty 15

## 2023-04-19 MED ORDER — FENTANYL CITRATE (PF) 250 MCG/5ML IJ SOLN
INTRAMUSCULAR | Status: AC
  Filled 2023-04-19: qty 5

## 2023-04-19 MED ORDER — HEPARIN 6000 UNIT IRRIGATION SOLUTION
Status: DC | PRN
  Administered 2023-04-19: 1

## 2023-04-19 MED ORDER — DEXAMETHASONE SODIUM PHOSPHATE 10 MG/ML IJ SOLN
INTRAMUSCULAR | Status: DC | PRN
  Administered 2023-04-19: 10 mg via INTRAVENOUS

## 2023-04-19 MED ORDER — ONDANSETRON HCL 4 MG/2ML IJ SOLN
INTRAMUSCULAR | Status: DC | PRN
  Administered 2023-04-19: 4 mg via INTRAVENOUS

## 2023-04-19 MED ORDER — FENTANYL CITRATE (PF) 250 MCG/5ML IJ SOLN
INTRAMUSCULAR | Status: DC | PRN
Start: 2023-04-19 — End: 2023-04-19
  Administered 2023-04-19 (×3): 50 ug via INTRAVENOUS

## 2023-04-19 MED ORDER — ONDANSETRON HCL 4 MG/2ML IJ SOLN
INTRAMUSCULAR | Status: AC
  Filled 2023-04-19: qty 2

## 2023-04-19 MED ORDER — HEPARIN SODIUM (PORCINE) 1000 UNIT/ML IJ SOLN
INTRAMUSCULAR | Status: DC | PRN
  Administered 2023-04-19: 3000 [IU] via INTRAVENOUS

## 2023-04-19 SURGICAL SUPPLY — 42 items
ARMBAND PINK RESTRICT EXTREMIT (MISCELLANEOUS) ×1 IMPLANT
BAG COUNTER SPONGE SURGICOUNT (BAG) ×1 IMPLANT
BALLN MUSTANG 7.0X40 75 (BALLOONS) ×1 IMPLANT
BALLOON MUSTANG 7.0X40 75 (BALLOONS) IMPLANT
BNDG ELASTIC 4INX 5YD STR LF (GAUZE/BANDAGES/DRESSINGS) IMPLANT
BNDG GAUZE DERMACEA FLUFF 4 (GAUZE/BANDAGES/DRESSINGS) IMPLANT
CANISTER SUCT 3000ML PPV (MISCELLANEOUS) ×1 IMPLANT
CATH EMB 4FR 40 (CATHETERS) IMPLANT
CLIP TI MEDIUM 6 (CLIP) ×1 IMPLANT
CLIP TI WIDE RED SMALL 6 (CLIP) ×1 IMPLANT
COVER PROBE W GEL 5X96 (DRAPES) IMPLANT
DERMABOND ADVANCED .7 DNX12 (GAUZE/BANDAGES/DRESSINGS) ×1 IMPLANT
DRAPE C-ARM 42X72 X-RAY (DRAPES) IMPLANT
ELECT REM PT RETURN 9FT ADLT (ELECTROSURGICAL) ×1 IMPLANT
ELECTRODE REM PT RTRN 9FT ADLT (ELECTROSURGICAL) ×1 IMPLANT
GLOVE SURG SS PI 7.5 STRL IVOR (GLOVE) ×3 IMPLANT
GOWN STRL REUS W/ TWL LRG LVL3 (GOWN DISPOSABLE) ×2 IMPLANT
GOWN STRL REUS W/ TWL XL LVL3 (GOWN DISPOSABLE) ×1 IMPLANT
HEMOSTAT SNOW SURGICEL 2X4 (HEMOSTASIS) IMPLANT
KIT BASIN OR (CUSTOM PROCEDURE TRAY) ×1 IMPLANT
KIT ENCORE 26 ADVANTAGE (KITS) IMPLANT
KIT TURNOVER KIT B (KITS) ×1 IMPLANT
NS IRRIG 1000ML POUR BTL (IV SOLUTION) ×1 IMPLANT
PACK CV ACCESS (CUSTOM PROCEDURE TRAY) ×1 IMPLANT
PAD ARMBOARD POSITIONER FOAM (MISCELLANEOUS) ×2 IMPLANT
PATCH VASC XENOSURE 1X6 (Vascular Products) IMPLANT
SET MICROPUNCTURE 5F STIFF (MISCELLANEOUS) IMPLANT
SHEATH PINNACLE R/O II 7F 4CM (SHEATH) IMPLANT
SLING ARM FOAM STRAP LRG (SOFTGOODS) IMPLANT
SLING ARM FOAM STRAP MED (SOFTGOODS) IMPLANT
SPONGE INTESTINAL PEANUT (DISPOSABLE) IMPLANT
STOPCOCK 4 WAY LG BORE MALE ST (IV SETS) IMPLANT
SURGIFLO W/THROMBIN 8M KIT (HEMOSTASIS) IMPLANT
SUT ETHILON 3 0 PS 1 (SUTURE) IMPLANT
SUT PROLENE 5 0 C 1 24 (SUTURE) IMPLANT
SUT PROLENE 6 0 CC (SUTURE) ×1 IMPLANT
SUT VIC AB 3-0 SH 27X BRD (SUTURE) ×1 IMPLANT
SUT VICRYL 4-0 PS2 18IN ABS (SUTURE) IMPLANT
TOWEL GREEN STERILE (TOWEL DISPOSABLE) ×1 IMPLANT
UNDERPAD 30X36 HEAVY ABSORB (UNDERPADS AND DIAPERS) ×1 IMPLANT
WATER STERILE IRR 1000ML POUR (IV SOLUTION) ×1 IMPLANT
WIRE BENTSON .035X145CM (WIRE) IMPLANT

## 2023-04-19 NOTE — Anesthesia Procedure Notes (Signed)
 Procedure Name: LMA Insertion Date/Time: 04/19/2023 9:45 AM  Performed by: Gloris Ham, CRNAPre-anesthesia Checklist: Patient identified, Emergency Drugs available, Suction available and Patient being monitored Patient Re-evaluated:Patient Re-evaluated prior to induction Oxygen Delivery Method: Circle System Utilized Preoxygenation: Pre-oxygenation with 100% oxygen Induction Type: IV induction Ventilation: Mask ventilation without difficulty LMA: LMA inserted LMA Size: 3.0 Number of attempts: 1 Airway Equipment and Method: Bite block Placement Confirmation: positive ETCO2 Tube secured with: Tape Dental Injury: Teeth and Oropharynx as per pre-operative assessment

## 2023-04-19 NOTE — Anesthesia Preprocedure Evaluation (Addendum)
 Anesthesia Evaluation  Patient identified by MRN, date of birth, ID band Patient awake    Reviewed: Allergy & Precautions, NPO status , Patient's Chart, lab work & pertinent test results  Airway Mallampati: II  TM Distance: >3 FB Neck ROM: Full    Dental  (+) Dental Advisory Given   Pulmonary shortness of breath   breath sounds clear to auscultation       Cardiovascular hypertension, Pt. on medications +CHF  + dysrhythmias  Rhythm:Regular Rate:Normal     Neuro/Psych  Headaches    GI/Hepatic Neg liver ROS,GERD  ,,  Endo/Other  negative endocrine ROS    Renal/GU ESRFRenal disease     Musculoskeletal  (+) Arthritis ,    Abdominal   Peds  Hematology  (+) Blood dyscrasia, anemia   Anesthesia Other Findings   Reproductive/Obstetrics                             Anesthesia Physical Anesthesia Plan  ASA: 4  Anesthesia Plan: General   Post-op Pain Management: Tylenol PO (pre-op)*   Induction: Intravenous  PONV Risk Score and Plan: 3 and Dexamethasone, Ondansetron and Treatment may vary due to age or medical condition  Airway Management Planned: LMA  Additional Equipment: None  Intra-op Plan:   Post-operative Plan: Extubation in OR  Informed Consent: I have reviewed the patients History and Physical, chart, labs and discussed the procedure including the risks, benefits and alternatives for the proposed anesthesia with the patient or authorized representative who has indicated his/her understanding and acceptance.     Dental advisory given  Plan Discussed with: CRNA  Anesthesia Plan Comments:        Anesthesia Quick Evaluation

## 2023-04-19 NOTE — Interval H&P Note (Signed)
 History and Physical Interval Note:  04/19/2023 7:27 AM  Karen [name]  has presented today for surgery, with the diagnosis of End stage renal disease.  The various methods of treatment have been discussed with the patient and family. After consideration of risks, benefits and other options for treatment, the patient has consented to  Procedure(s) with comments: REVISON OF ARTERIOVENOUS FISTULA (Right) - VENOPLASTY (16109) as a surgical intervention.  The patient's history has been reviewed, patient examined, no change in status, stable for surgery.  I have reviewed the patient's chart and labs.  Questions were answered to the patient's satisfaction.     Durene Cal

## 2023-04-19 NOTE — Transfer of Care (Signed)
 Immediate Anesthesia Transfer of Care Note  Patient: Karen [name]  Procedure(s) Performed: REVISON OF ARTERIOVENOUS FISTULA (Right: Arm Upper) ANGIOPLASTY, PATCH (Right: Arm Upper)  Patient Location: PACU  Anesthesia Type:General  Level of Consciousness: awake, alert , and oriented  Airway & Oxygen Therapy: Patient Spontanous Breathing  Post-op Assessment: Report given to RN and Post -op Vital signs reviewed and stable  Post vital signs: Reviewed and stable  Last Vitals:  Vitals Value Taken Time  BP 103/66 04/19/23 1155  Temp 36.4 C 04/19/23 1155  Pulse 95 04/19/23 1156  Resp 14 04/19/23 1156  SpO2 100 % 04/19/23 1156  Vitals shown include unfiled device data.  Last Pain:  Vitals:   04/19/23 1155  TempSrc:   PainSc: Asleep         Complications: There were no known notable events for this encounter.

## 2023-04-19 NOTE — Anesthesia Postprocedure Evaluation (Signed)
 Anesthesia Post Note  Patient: Karen [name]  Procedure(s) Performed: REVISON OF ARTERIOVENOUS FISTULA (Right: Arm Upper) ANGIOPLASTY, PATCH (Right: Arm Upper)     Patient location during evaluation: PACU Anesthesia Type: General Level of consciousness: awake Pain management: pain level controlled Vital Signs Assessment: post-procedure vital signs reviewed and stable Respiratory status: spontaneous breathing, nonlabored ventilation and respiratory function stable Cardiovascular status: blood pressure returned to baseline and stable Postop Assessment: no apparent nausea or vomiting Anesthetic complications: no  There were no known notable events for this encounter.  Last Vitals:  Vitals:   04/19/23 1300 04/19/23 1315  BP: 102/66 104/63  Pulse: 94 90  Resp: 16 13  Temp:    SpO2: 96% 97%    Last Pain:  Vitals:   04/19/23 1300  TempSrc:   PainSc: Asleep                 Vishaal Strollo,W. EDMOND

## 2023-04-20 ENCOUNTER — Encounter (HOSPITAL_COMMUNITY): Payer: Self-pay | Admitting: Surgery

## 2023-04-20 NOTE — Op Note (Signed)
 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  I did not like the quality of the anterior surface of the vein and did not feel primary closure would be appropriate and so I used a bovine pericardial patch for patch venoplasty  Indications: This is a 68 year old female who was originally scheduled for fistulogram however when she arrived her fistula had occluded.  I performed mechanical thrombectomy and was able to get the vein open but because of the amount of residual thrombus I did not feel that this was going to be a durable option for her and so I put a catheter in.  She return to the office a few weeks later and she had an excellent thrill within her fistula.  Because of the amount of thrombus within the aneurysmal/ectatic portion of her vein I felt that she needed to go to the operating room for addressing this area.  Procedure:  The patient was identified in the holding area and taken to [facility] OR ROOM 12  The patient was then placed supine on the table. general anesthesia was administered.  The patient was prepped and draped in the usual sterile fashion.  A time out was called and antibiotics were administered.  A PA was necessary to expect the procedure and assist with technical details.  She helped with exposure by providing suction and retraction.  She up with the anastomosis by following suture.  An elliptical incision was made over top of the aneurysma portion of the fistula which had  thinned out skin.  Cautery was used to isolate the fistula.  The anterior wall was very thin and I actually entered the fistula dissecting it out.  This required me to clamp the fistula proximally and placed a Fogarty balloon centrally.  I then proceeded to circumferentially expose the fistula.  Once you had a fully exposed, ended up giving 3000 heparin.  I resected the overlying paddle of skin.  I had to trim out some of the vein wall because it was too fragile and thin.  I did not feel the primary closure could be performed.  Therefore I felt patch repair would be the best option rather than a interposition graft.  Before I did this, the patient had mural thrombus on the posterior wall of the vein which I removed, similar to performing endarterectomy.  I then performed thrombectomy of the proximal and distal portions of the fistula with good flow reestablished.  A bovine pericardial patch was then used to perform patch venoplasty with a 5-0 Prolene.  Prior to completion, the appropriate flushing maneuvers were performed.  Once the repair was completed and the clamps were released, there was an excellent thrill within the fistula.  I thought about performing a fistulogram however I did not feel that was necessary based on the way the fistula felt.  I gave 25 mg of protamine.  I irrigated and washed out the wound.  I reapproximated the subcutaneous tissue with interrupted 3-0 Vicryl as the tissue would not hold a suture.  I then  closed the skin with a running 3-0 nylon followed by sterile dressings.  The patient was successfully extubated and taken recovery in stable condition.  There were no immediate complications.   Disposition: To PACU stable.   Juleen China, M.D., [facility] [place] Office: [phone] Pager:  [phone]

## 2023-04-25 ENCOUNTER — Emergency Department (HOSPITAL_COMMUNITY)
Admission: EM | Admit: 2023-04-25 | Discharge: 2023-04-25 | Disposition: A | Discharge status: Home / Self Care | Attending: Emergency Medicine | Admitting: Emergency Medicine

## 2023-04-25 ENCOUNTER — Emergency Department (HOSPITAL_COMMUNITY)

## 2023-04-25 ENCOUNTER — Encounter (HOSPITAL_COMMUNITY): Payer: Self-pay | Admitting: Emergency Medicine

## 2023-04-25 DIAGNOSIS — N186 End stage renal disease: Secondary | ICD-10-CM | POA: Insufficient documentation

## 2023-04-25 DIAGNOSIS — D631 Anemia in chronic kidney disease: Secondary | ICD-10-CM | POA: Diagnosis not present

## 2023-04-25 DIAGNOSIS — R7989 Other specified abnormal findings of blood chemistry: Secondary | ICD-10-CM | POA: Diagnosis not present

## 2023-04-25 DIAGNOSIS — I4891 Unspecified atrial fibrillation: Secondary | ICD-10-CM | POA: Diagnosis not present

## 2023-04-25 DIAGNOSIS — Z992 Dependence on renal dialysis: Secondary | ICD-10-CM | POA: Diagnosis not present

## 2023-04-25 DIAGNOSIS — R0602 Shortness of breath: Secondary | ICD-10-CM | POA: Insufficient documentation

## 2023-04-25 DIAGNOSIS — R0609 Other forms of dyspnea: Secondary | ICD-10-CM

## 2023-04-25 LAB — COMPREHENSIVE METABOLIC PANEL WITH GFR
ALT: 6 U/L (ref 0–44)
AST: 20 U/L (ref 15–41)
Albumin: 2.8 g/dL — ABNORMAL LOW (ref 3.5–5.0)
Alkaline Phosphatase: 117 U/L (ref 38–126)
Anion gap: 13 (ref 5–15)
BUN: 14 mg/dL (ref 8–23)
CO2: 28 mmol/L (ref 22–32)
Calcium: 8.1 mg/dL — ABNORMAL LOW (ref 8.9–10.3)
Chloride: 99 mmol/L (ref 98–111)
Creatinine, Ser: 4.49 mg/dL — ABNORMAL HIGH (ref 0.44–1.00)
GFR, Estimated: 10 mL/min — ABNORMAL LOW (ref >60)
Glucose, Bld: 68 mg/dL — ABNORMAL LOW (ref 70–99)
Potassium: 3.7 mmol/L (ref 3.5–5.1)
Sodium: 140 mmol/L (ref 135–145)
Total Bilirubin: 0.5 mg/dL (ref 0.0–1.2)
Total Protein: 6.1 g/dL — ABNORMAL LOW (ref 6.5–8.1)

## 2023-04-25 LAB — MAGNESIUM: Magnesium: 2 mg/dL (ref 1.7–2.4)

## 2023-04-25 LAB — BRAIN NATRIURETIC PEPTIDE: B Natriuretic Peptide: 286.7 pg/mL — ABNORMAL HIGH (ref 0.0–100.0)

## 2023-04-25 LAB — OCCULT BLOOD X 1 CARD TO LAB, STOOL: Fecal Occult Bld: NEGATIVE

## 2023-04-25 LAB — CBC
HCT: 26.1 % — ABNORMAL LOW (ref 36.0–46.0)
Hemoglobin: 8 g/dL — ABNORMAL LOW (ref 12.0–15.0)
MCH: 31.3 pg (ref 26.0–34.0)
MCHC: 30.7 g/dL (ref 30.0–36.0)
MCV: 102 fL — ABNORMAL HIGH (ref 80.0–100.0)
Platelets: 144 10*3/uL — ABNORMAL LOW (ref 150–400)
RBC: 2.56 MIL/uL — ABNORMAL LOW (ref 3.87–5.11)
RDW: 16.3 % — ABNORMAL HIGH (ref 11.5–15.5)
WBC: 4.3 10*3/uL (ref 4.0–10.5)
nRBC: 0 % (ref 0.0–0.2)

## 2023-04-25 LAB — RESP PANEL BY RT-PCR (RSV, FLU A&B, COVID)  RVPGX2
Influenza A by PCR: NEGATIVE
Influenza B by PCR: NEGATIVE
Resp Syncytial Virus by PCR: NEGATIVE
SARS Coronavirus 2 by RT PCR: NEGATIVE

## 2023-04-25 LAB — TROPONIN I (HIGH SENSITIVITY)
Troponin I (High Sensitivity): 6 ng/L (ref <18)
Troponin I (High Sensitivity): 6 ng/L (ref <18)

## 2023-04-25 NOTE — ED Triage Notes (Signed)
 Pt here from dialysis where she received 3 hrs of treatment and went into afib rvr with a hx of same , rate down to the 90's on arrival

## 2023-04-25 NOTE — ED Provider Notes (Signed)
 [place] EMERGENCY DEPARTMENT AT [facility] Provider Note   CSN: [id number] Arrival date & time: 04/25/23  1036     History Chief Complaint  Patient presents with   Atrial Fibrillation    Karen [name] is a 68 y.o. female.  Patient past history significant for ESRD on hemodialysis, atrial fibrillation, chronic constipation, chronic nausea, obesity presents the emergency department today with concerns of irregular heart rhythm.  Patient reportedly had a dialysis session which she received 3 hours of treatment and then of note went into atrial fibrillation with rapid ventricular response.  Patient has a history of similar symptoms.  She states that she currently takes diltiazem 30 mg on Monday, Wednesday, Friday to coincide with dialysis sessions.  On off days, she takes diltiazem 120 mg extended release.  She reports compliance with these medications.  States that she is not current on any blood thinner.  Endorses some increasing feelings of shortness of breath over the last several weeks and some chest tightness but denies any chest pain at this time.   Atrial Fibrillation Associated symptoms include shortness of breath.       Home Medications Prior to Admission medications   Medication Sig Start Date End Date Taking? Authorizing Provider  amoxicillin-clavulanate (AUGMENTIN) 500-125 MG tablet Take 1 tablet by mouth daily. 1 tablet after dialysis for coughing    [provider]  atorvastatin (LIPITOR) 10 MG tablet Take 1 tablet (10 mg total) by mouth every evening. 04/09/23   Runell Gess, MD  benzonatate (TESSALON) 100 MG capsule Take 100 mg by mouth as needed for cough.    [provider]  clobetasol cream (TEMOVATE) 0.05 % Apply 1 application topically daily as needed (for rash).     [provider]  diltiazem (CARDIZEM CD) 120 MG 24 hr capsule TAKE 1 CAPSULE (120 MG TOTAL) BY MOUTH EVERY TUESDAY, THURSDAY, SATURDAY, AND SUNDAY AT 6  PM. 11/10/21   Newman Nip, NP  diltiazem (CARDIZEM) 30 MG tablet TAKE 1 TABLET (30 MG TOTAL) BY MOUTH EVERY MONDAY, WEDNESDAY, AND FRIDAY. Patient taking differently: Take 30 mg by mouth every Monday, Wednesday, and Friday. After dialysis 10/27/22   Runell Gess, MD  Doxercalciferol (HECTOROL IV) Doxercalciferol (Hectorol) 09/01/22 03/28/24  [provider]  ethyl chloride spray Apply 1 application topically every Monday, Wednesday, and Friday with hemodialysis.    [provider]  fluticasone (FLONASE) 50 MCG/ACT nasal spray Place 1 spray into both nostrils daily as needed for allergies.    [provider]  lidocaine-prilocaine (EMLA) cream Apply 1 application topically See admin instructions. Apply topically Monday, Wednesday and Friday before dialysis 05/26/15   [provider]  midodrine (PROAMATINE) 10 MG tablet Take 2 tablets (20 mg total) by mouth every Monday, Wednesday, and Friday. Prior to dialysis 08/20/20   Love, Evlyn Kanner, PA-C  multivitamin (RENA-VIT) TABS tablet Take 1 tablet by mouth in the morning.    [provider]  ondansetron (ZOFRAN) 8 MG tablet Take 1 tablet (8 mg total) by mouth every 8 (eight) hours as needed for nausea or vomiting. 08/17/20   Love, Evlyn Kanner, PA-C  oxyCODONE-acetaminophen (PERCOCET/ROXICET) 5-325 MG tablet Take 1 tablet by mouth every 6 (six) hours as needed. 04/19/23   Lars Mage, PA-C  prochlorperazine (COMPAZINE) 10 MG tablet Take 10 mg by mouth every 6 (six) hours as needed for nausea or vomiting. 08/21/18 03/29/27  Serena Croissant, MD  sevelamer carbonate (RENVELA) 800 MG tablet Take  1 tablet (800 mg total) by mouth 3 (three) times daily with meals. Patient taking differently: Take 1,600-3,200 mg by mouth See admin instructions. Take 4 tablets (3200 mg) by mouth with each meal and take 2 tablets (1600 mg) by mouth with each snack 08/20/20   Love, Evlyn Kanner, PA-C      Allergies    Patient has no known allergies.     Review of Systems   Review of Systems  Respiratory:  Positive for shortness of breath.   Cardiovascular:  Positive for palpitations.  All other systems reviewed and are negative.   Physical Exam Updated Vital Signs BP (!) 111/52   Pulse 85   Temp 98.9 F (37.2 C) (Oral)   Resp (!) 21   LMP  (LMP Unknown)   SpO2 100%  Physical Exam Vitals and nursing note reviewed.  Constitutional:      General: She is not in acute distress.    Appearance: She is well-developed.  HENT:     Head: Normocephalic and atraumatic.  Eyes:     Conjunctiva/sclera: Conjunctivae normal.  Cardiovascular:     Rate and Rhythm: Tachycardia present. Rhythm irregular.     Heart sounds: No murmur heard.    No gallop.  Pulmonary:     Effort: Pulmonary effort is normal. No respiratory distress.     Breath sounds: Normal breath sounds. No wheezing or rales.  Abdominal:     General: Abdomen is flat. Bowel sounds are normal. There is no distension.     Palpations: Abdomen is soft.     Tenderness: There is no abdominal tenderness. There is no guarding.  Musculoskeletal:        General: No swelling.     Cervical back: Neck supple.  Skin:    General: Skin is warm and dry.     Capillary Refill: Capillary refill takes less than 2 seconds.  Neurological:     Mental Status: She is alert.  Psychiatric:        Mood and Affect: Mood normal.     ED Results / Procedures / Treatments   Labs (all labs ordered are listed, but only abnormal results are displayed) Labs Reviewed  CBC - Abnormal; Notable for the following components:      Result Value   RBC 2.56 (*)    Hemoglobin 8.0 (*)    HCT 26.1 (*)    MCV 102.0 (*)    RDW 16.3 (*)    Platelets 144 (*)    All other components within normal limits  COMPREHENSIVE METABOLIC PANEL WITH GFR - Abnormal; Notable for the following components:   Glucose, Bld 68 (*)    Creatinine, Ser 4.49 (*)    Calcium 8.1 (*)    Total Protein 6.1 (*)    Albumin 2.8 (*)     GFR, Estimated 10 (*)    All other components within normal limits  BRAIN NATRIURETIC PEPTIDE - Abnormal; Notable for the following components:   B Natriuretic Peptide 286.7 (*)    All other components within normal limits  RESP PANEL BY RT-PCR (RSV, FLU A&B, COVID)  RVPGX2  MAGNESIUM  OCCULT BLOOD X 1 CARD TO LAB, STOOL  POC OCCULT BLOOD, ED  POC OCCULT BLOOD, ED  TROPONIN I (HIGH SENSITIVITY)  TROPONIN I (HIGH SENSITIVITY)    EKG EKG Interpretation Date/Time:  Wednesday April 25 2023 14:20:26 EDT Ventricular Rate:  79 PR Interval:  168 QRS Duration:  93 QT Interval:  417 QTC Calculation: 478 R  Axis:   -5  Text Interpretation: Sinus rhythm Abnormal R-wave progression, early transition Confirmed by Pricilla Loveless [phone]) on 04/25/2023 3:36:43 PM  Radiology DG Chest Portable 1 View Result Date: 04/25/2023 CLINICAL DATA:  Shortness of breath. EXAM: PORTABLE CHEST 1 VIEW COMPARISON:  April 15, 2019. FINDINGS: Stable cardiomediastinal silhouette. Right internal jugular dialysis catheter is noted with tip in expected position of right atrium. Left lung is clear. Bony thorax is unremarkable. IMPRESSION: No active disease. Electronically Signed   By: Lupita Raider M.D.   On: 04/25/2023 14:31    Procedures Procedures    Medications Ordered in ED Medications - No data to display  ED Course/ Medical Decision Making/ A&P                                 Medical Decision Making Amount and/or Complexity of Data Reviewed Labs: ordered. Radiology: ordered.   This patient presents to the ED for concern of irregular heart rhythm.  Differential diagnosis includes atrial fibrillation, new heart block, CHF exacerbation, exertional dyspnea, hyperkalemia   Lab Tests:  I Ordered, and personally interpreted labs.  The pertinent results include: CBC shows decline in hemoglobin down to 8.0 compared to 10.63 weeks ago, CMP unremarkable, BNP elevated at 286.7, troponin normal at 6 with delta  flat at 6, troponin negative, magnesium normal at 2, Hemoccult negative   Imaging Studies ordered:  I ordered imaging studies including chest x-ray I independently visualized and interpreted imaging which showed no acute findings I agree with the radiologist interpretation   Problem List / ED Course:  Patient with a history significant for ESRD on hemodialysis, atrial fibrillation, chronic constipation, chronic nausea, obesity presents the ED today with concerns of irregular heart rhythm.  Patient was reportedly at a dialysis initiated about 3 hours into associated when she began to note irregular rhythm and was found to be in atrial fibrillation.  Patient has a past history of similar onset of symptoms during dialysis.  She reports vomiting any blood thinners at this time.  She does currently take diltiazem 30 mg on dialysis days and diltiazem 120 mg extended release on nondialysis days.  Reports compliance with these medications.  Does endorse some mild shortness of breath and chest tightness with denies any significant chest pain. Physical exam is reassuring although there is an irregular rhythm present with intermittent episodes of tachycardia.  Heart monitor at this time shows patient with irregular rhythm at approximately 95 bpm.  EKG shows patient is in normal sinus rhythm with a bundle branch block but no evidence of atrial fibrillation with RVR.  Suspect this may be secondary to dialysis and possible electrolyte disturbances.  Will obtain labs for further assessment. Lab workup reveals hemoglobin down to 8.0 compared to 10.6 from 3 weeks ago.  BNP also elevated at 286.7.  No history of CHF and no prior BNP levels to compare to.  Likely due to patient's ESRD status.  Other labs unremarkable. Hemoccult negative.  Doubtful patient's decline in hemoglobin is due to a GI bleed.  More likely to be due to patient's ESRD and likely worsening RBC production.  The drop in hemoglobin would likely  coincide the patient's feelings of weakness and probably some shortness of breath in conjunction with BNP level also being elevated.  Patient reports high compliance with dialysis sessions and appears only missed 1 session last several weeks.  Unclear if this is potentially due  to short dialysis sessions as she does report that she began to get hypotensive towards the end of her sessions and typically has to have her session discontinued at that time. I suspect symptoms are findings are likely sequela of worsening renal status and given HD session today, no indication for emergent dialysis today. Patient would not tolerate diuretics well. No indication for blood product administration at this time. Will discharge with plans for outpatient follow up with nephrology and continued dialysis sessions.  Final Clinical Impression(s) / ED Diagnoses Final diagnoses:  Dyspnea on exertion  Elevated brain natriuretic peptide (BNP) level  Anemia due to chronic kidney disease, on chronic dialysis [facility])    Rx / DC Orders ED Discharge Orders     None         Salomon Mast 04/25/23 2032    Pricilla Loveless, MD 04/26/23 [phone]

## 2023-04-25 NOTE — Discharge Instructions (Signed)
 You were seen in the ER today for concerns of an abnormal heart rhythm and weakness. Your labs and imaging showed that you have signs of anemia but had a normal EKG. You have evidence of mild fluid overload. These conditions are all likely due to your chronic kidney disease and you should follow up with your nephrologist for further evaluation. For any concerns of new or worsening symptoms, return to the ER. Try to make sure you are limiting salt intake and restricting fluids as recommend by your nephrologist.

## 2023-05-01 ENCOUNTER — Other Ambulatory Visit: Payer: Self-pay | Admitting: Cardiovascular Disease

## 2023-05-06 ENCOUNTER — Other Ambulatory Visit (HOSPITAL_COMMUNITY): Payer: Self-pay | Admitting: Cardiovascular Disease

## 2023-05-14 ENCOUNTER — Ambulatory Visit (HOSPITAL_COMMUNITY)
Admission: RE | Admit: 2023-05-14 | Discharge: 2023-05-14 | Disposition: A | Discharge status: Home / Self Care | Source: Ambulatory Visit | Attending: Cardiovascular Disease | Admitting: Cardiovascular Disease

## 2023-05-14 DIAGNOSIS — E782 Mixed hyperlipidemia: Secondary | ICD-10-CM | POA: Insufficient documentation

## 2023-05-14 DIAGNOSIS — R0609 Other forms of dyspnea: Secondary | ICD-10-CM | POA: Diagnosis not present

## 2023-05-14 DIAGNOSIS — R0789 Other chest pain: Secondary | ICD-10-CM | POA: Diagnosis present

## 2023-05-14 LAB — ECHOCARDIOGRAM COMPLETE
AR max vel: 1.94 cm2
AV Area VTI: 1.97 cm2
AV Area mean vel: 1.93 cm2
AV Mean grad: 5 mmHg
AV Peak grad: 10.2 mmHg
Ao pk vel: 1.6 m/s
Area-P 1/2: 5.13 cm2
S' Lateral: 2.27 cm

## 2023-05-21 ENCOUNTER — Ambulatory Visit: Attending: Surgery | Admitting: Physician Assistant

## 2023-05-21 ENCOUNTER — Encounter: Payer: Self-pay | Admitting: Physician Assistant

## 2023-05-21 VITALS — BP 92/62 | HR 81 | Temp 98.2°F | Wt 179.6 lb

## 2023-05-21 DIAGNOSIS — N186 End stage renal disease: Secondary | ICD-10-CM

## 2023-05-21 DIAGNOSIS — Z992 Dependence on renal dialysis: Secondary | ICD-10-CM

## 2023-05-21 NOTE — Progress Notes (Signed)
 POST OPERATIVE OFFICE NOTE    CC:  F/u for surgery  HPI:  This is a 68 y.o. female who is s/p revision of right arm AVF, thrombectomy right arm fistula, patch venoplasty right arm fistula with bovine pericardial patch 04/19/2023 Dr. Charlotte Cookey.  Dr. Charlotte Cookey did not feel this would be very durable and felt she would likely need a new right upper arm graft. She comes back in today for further discussions.   Pt states she does not have pain/numbness in the right hand.  She states her catheter is working well.    She states that her husband is not happy being in the SNF facility.    The pt is on dialysis T/T/S at [facility] location.  Dialysis access history: -LUA AVG 12/09/2013 Dr. Nolene Baumgarten -angioplasty left axillary and brachial vein 08/31/2015 Dr. Charlotte Cookey -RUA AVG 01/01/2018 Dr. 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 Vikki Graves -right 2nd stage BVT 11/12/2018 Dr. Vikki Graves -drug coated balloon venoplasty right basilic vein 04/22/2019 Dr. Charlotte Cookey -stent placement right basilic vein 06/12/2019 Dr. Charlotte Cookey -right arm venoplasty peripheral vein 02/17/2020 Dr. Charlotte Cookey -balloon venoplasty right basilic vein 11/23/2020 Dr. Charlotte Cookey -removal of LUA AVG and left brachial artery vein patch angioplasty 02/10/2021 Dr. Charlotte Cookey  -drug coated venoplasty 12/27/2021 Dr. Charlotte Cookey -balloon venoplasty right BVT fistula 09/13/2022 Dr. Rosalva Comber -fistulogram with mechanical thrombectomy right basilic vein, venoplasty right basilic vein, TPA use and TDC placement 04/03/2023 by Dr. Charlotte Cookey -revision of right arm AVF, thrombectomy right arm fistula, patch venoplasty right arm fistula with bovine pericardial patch 04/19/2023 Dr. Charlotte Cookey  No Known Allergies  Current Outpatient Medications  Medication Sig Dispense Refill   amoxicillin-clavulanate (AUGMENTIN) 500-125 MG tablet Take 1 tablet by mouth daily. 1 tablet after dialysis for coughing     atorvastatin  (LIPITOR) 10 MG tablet Take 1 tablet (10 mg total) by mouth every evening. 30  tablet 0   benzonatate  (TESSALON ) 100 MG capsule Take 100 mg by mouth as needed for cough.     clobetasol cream (TEMOVATE) 0.05 % Apply 1 application topically daily as needed (for rash).      diltiazem  (CARDIZEM  CD) 120 MG 24 hr capsule TAKE 1 CAPSULE (120 MG TOTAL) BY MOUTH EVERY TUESDAY, THURSDAY, SATURDAY, AND SUNDAY AT 6 PM. 48 capsule 3   diltiazem  (CARDIZEM ) 30 MG tablet TAKE 1 TABLET (30 MG TOTAL) BY MOUTH EVERY MONDAY, WEDNESDAY, AND FRIDAY. 36 tablet 1   Doxercalciferol (HECTOROL IV) Doxercalciferol (Hectorol)     ethyl chloride spray Apply 1 application topically every Monday, Wednesday, and Friday with hemodialysis.     fluticasone (FLONASE) 50 MCG/ACT nasal spray Place 1 spray into both nostrils daily as needed for allergies.     lidocaine -prilocaine  (EMLA ) cream Apply 1 application topically See admin instructions. Apply topically Monday, Wednesday and Friday before dialysis  6   midodrine  (PROAMATINE ) 10 MG tablet Take 2 tablets (20 mg total) by mouth every Monday, Wednesday, and Friday. Prior to dialysis 28 tablet 0   multivitamin (RENA-VIT) TABS tablet Take 1 tablet by mouth in the morning.     ondansetron  (ZOFRAN ) 8 MG tablet Take 1 tablet (8 mg total) by mouth every 8 (eight) hours as needed for nausea or vomiting. 20 tablet 0   oxyCODONE -acetaminophen  (PERCOCET/ROXICET) 5-325 MG tablet Take 1 tablet by mouth every 6 (six) hours as needed. 30 tablet 0   prochlorperazine  (COMPAZINE ) 10 MG tablet Take 10 mg by mouth every 6 (six) hours as needed for nausea or vomiting.     sevelamer   carbonate (RENVELA ) 800 MG tablet Take 1 tablet (800 mg total) by mouth 3 (three) times daily with meals. (Patient taking differently: Take 1,600-3,200 mg by mouth See admin instructions. Take 4 tablets (3200 mg) by mouth with each meal and take 2 tablets (1600 mg) by mouth with each snack)     Current Facility-Administered Medications  Medication Dose Route Frequency Provider Last Rate Last Admin   0.9  %  sodium chloride  infusion  250 mL Intravenous PRN Margherita Shell, MD       0.9 %  sodium chloride  infusion  250 mL Intravenous PRN Robins, Joshua E, MD       sodium chloride  flush (NS) 0.9 % injection 3 mL  3 mL Intravenous Q12H Margherita Shell, MD       sodium chloride  flush (NS) 0.9 % injection 3 mL  3 mL Intravenous Q12H Robins, Joshua E, MD       sodium chloride  flush (NS) 0.9 % injection 3 mL  3 mL Intravenous PRN Margherita Shell, MD         ROS:  See HPI  Physical Exam:  Today's Vitals   05/21/23 1321  BP: 92/62  Pulse: 81  Temp: 98.2 F (36.8 C)  TempSrc: Temporal  SpO2: 97%  Weight: 179 lb 9.6 oz (81.5 kg)  PainSc: 0-No pain   Body mass index is 33.94 kg/m.   Incision:  healed nicely with running nylon suture Extremities:   There is a palpable right radial pulse.   Motor and sensory are in tact.   The fistula is pulsatile Access is  easily palpable    Assessment/Plan:  This is a 68 y.o. female who is s/p: revision of right arm AVF, thrombectomy right arm fistula, patch venoplasty right arm fistula with bovine pericardial patch 04/19/2023 Dr. Charlotte Cookey  -the pt does not have evidence of steal. -pt's access can be used in 2-3 weeks.  If there is difficulty with the fistula, she would need a fistulogram to evaluate her fistula.   -if pt has tunneled catheter, this can be removed at the discretion of the dialysis center once the pt's access has been successfully cannulated to their satisfaction.  -discussed with pt that access does not last forever and will need intervention or even new access at some point.  -the pt will follow up as needed   Maryanna Smart, [facility] [phone]  Clinic MD:  Charlotte Cookey

## 2023-05-24 ENCOUNTER — Other Ambulatory Visit (HOSPITAL_COMMUNITY)

## 2023-05-28 LAB — COLOGUARD: COLOGUARD: NEGATIVE

## 2023-06-27 ENCOUNTER — Telehealth (HOSPITAL_COMMUNITY): Payer: Self-pay

## 2023-06-27 ENCOUNTER — Other Ambulatory Visit (HOSPITAL_COMMUNITY): Payer: Self-pay | Admitting: Nephrology

## 2023-06-27 DIAGNOSIS — N186 End stage renal disease: Secondary | ICD-10-CM

## 2023-06-27 NOTE — Telephone Encounter (Signed)
 Called to schedule TDC removal, no answer, left vm. AB

## 2023-07-03 ENCOUNTER — Ambulatory Visit (HOSPITAL_COMMUNITY)
Admission: RE | Admit: 2023-07-03 | Discharge: 2023-07-03 | Disposition: A | Discharge status: Home / Self Care | Source: Ambulatory Visit | Attending: Nephrology | Admitting: Nephrology

## 2023-07-03 DIAGNOSIS — N186 End stage renal disease: Secondary | ICD-10-CM | POA: Insufficient documentation

## 2023-07-03 DIAGNOSIS — Z992 Dependence on renal dialysis: Secondary | ICD-10-CM | POA: Diagnosis not present

## 2023-07-03 HISTORY — PX: IR REMOVAL TUN CV CATH W/O FL: IMG2289

## 2023-07-03 MED ORDER — LIDOCAINE HCL 1 % IJ SOLN
INTRAMUSCULAR | Status: AC
Start: 2023-07-03 — End: 2023-07-03
  Filled 2023-07-03: qty 20

## 2023-07-03 MED ORDER — LIDOCAINE HCL 1 % IJ SOLN
10.0000 mL | Freq: Once | INTRAMUSCULAR | Status: AC
  Administered 2023-07-03: 10 mL via INTRADERMAL

## 2023-07-04 ENCOUNTER — Other Ambulatory Visit: Payer: Self-pay | Admitting: Hematology and Oncology

## 2023-07-04 DIAGNOSIS — R921 Mammographic calcification found on diagnostic imaging of breast: Secondary | ICD-10-CM

## 2023-08-07 ENCOUNTER — Telehealth: Payer: Self-pay

## 2023-08-07 ENCOUNTER — Other Ambulatory Visit (HOSPITAL_COMMUNITY): Payer: Self-pay

## 2023-08-07 DIAGNOSIS — N186 End stage renal disease: Secondary | ICD-10-CM

## 2023-08-07 NOTE — Telephone Encounter (Signed)
 Patient called and is concerned that there is a stitch that was left behind after HD cath removal in June 2025. It is unlikely that a stitch was missed as this usually involves removal of a single suture. She would like the site assessed as she reports being able to feel a suture-like projection. Communicated that we are unable to assess this over the phone adequately and recommended the patient come in for an appt to evaluate.

## 2023-08-08 ENCOUNTER — Other Ambulatory Visit (HOSPITAL_COMMUNITY): Payer: Self-pay

## 2023-08-08 ENCOUNTER — Ambulatory Visit (HOSPITAL_COMMUNITY)
Admission: RE | Admit: 2023-08-08 | Discharge: 2023-08-08 | Disposition: A | Discharge status: Home / Self Care | Source: Ambulatory Visit

## 2023-08-08 ENCOUNTER — Encounter (HOSPITAL_COMMUNITY): Payer: Self-pay

## 2023-08-08 DIAGNOSIS — N186 End stage renal disease: Secondary | ICD-10-CM

## 2023-08-08 HISTORY — PX: IR PATIENT EVAL TECH 0-60 MINS: IMG5564

## 2023-08-08 NOTE — Procedures (Signed)
 Pt came in for retained suture from tunneled catheter removal. Suture was easily seen from skin. Cleaned with chloraprep and suture was removed with tweezers.

## 2023-08-14 ENCOUNTER — Ambulatory Visit
Admission: RE | Admit: 2023-08-14 | Discharge: 2023-08-14 | Disposition: A | Discharge status: Home / Self Care | Source: Ambulatory Visit | Attending: Hematology and Oncology | Admitting: Hematology and Oncology

## 2023-08-14 DIAGNOSIS — R921 Mammographic calcification found on diagnostic imaging of breast: Secondary | ICD-10-CM

## 2023-08-24 ENCOUNTER — Other Ambulatory Visit: Payer: Self-pay | Admitting: Cardiovascular Disease

## 2023-09-28 ENCOUNTER — Encounter: Payer: Self-pay | Admitting: Student

## 2023-10-21 ENCOUNTER — Other Ambulatory Visit: Payer: Self-pay | Admitting: Cardiovascular Disease

## 2023-11-13 ENCOUNTER — Encounter (HOSPITAL_COMMUNITY): Payer: Self-pay

## 2023-11-15 ENCOUNTER — Encounter (HOSPITAL_COMMUNITY)
Admission: RE | Disposition: A | Discharge status: Home / Self Care | Payer: Self-pay | Source: Home / Self Care | Attending: Surgery

## 2023-11-15 ENCOUNTER — Other Ambulatory Visit: Payer: Self-pay

## 2023-11-15 ENCOUNTER — Encounter (HOSPITAL_COMMUNITY): Payer: Self-pay | Admitting: Surgery

## 2023-11-15 ENCOUNTER — Ambulatory Visit (HOSPITAL_COMMUNITY)
Admission: RE | Admit: 2023-11-15 | Discharge: 2023-11-15 | Disposition: A | Discharge status: Home / Self Care | Attending: Surgery | Admitting: Surgery

## 2023-11-15 DIAGNOSIS — N186 End stage renal disease: Secondary | ICD-10-CM | POA: Insufficient documentation

## 2023-11-15 DIAGNOSIS — Y832 Surgical operation with anastomosis, bypass or graft as the cause of abnormal reaction of the patient, or of later complication, without mention of misadventure at the time of the procedure: Secondary | ICD-10-CM | POA: Insufficient documentation

## 2023-11-15 DIAGNOSIS — T82858A Stenosis of vascular prosthetic devices, implants and grafts, initial encounter: Secondary | ICD-10-CM | POA: Insufficient documentation

## 2023-11-15 DIAGNOSIS — I132 Hypertensive heart and chronic kidney disease with heart failure and with stage 5 chronic kidney disease, or end stage renal disease: Secondary | ICD-10-CM | POA: Insufficient documentation

## 2023-11-15 DIAGNOSIS — Z992 Dependence on renal dialysis: Secondary | ICD-10-CM | POA: Insufficient documentation

## 2023-11-15 DIAGNOSIS — I509 Heart failure, unspecified: Secondary | ICD-10-CM | POA: Diagnosis not present

## 2023-11-15 HISTORY — PX: VENOUS ANGIOPLASTY: CATH118376

## 2023-11-15 HISTORY — PX: A/V FISTULAGRAM: CATH118298

## 2023-11-15 MED ORDER — HEPARIN (PORCINE) IN NACL 1000-0.9 UT/500ML-% IV SOLN
INTRAVENOUS | Status: DC | PRN
  Administered 2023-11-15: 500 mL

## 2023-11-15 MED ORDER — IODIXANOL 320 MG/ML IV SOLN
INTRAVENOUS | Status: DC | PRN
  Administered 2023-11-15: 30 mL via INTRAVENOUS

## 2023-11-15 MED ORDER — LIDOCAINE HCL (PF) 1 % IJ SOLN
INTRAMUSCULAR | Status: AC
  Filled 2023-11-15: qty 30

## 2023-11-15 MED ORDER — LIDOCAINE HCL (PF) 1 % IJ SOLN
INTRAMUSCULAR | Status: DC | PRN
  Administered 2023-11-15: 2 mL via INTRADERMAL

## 2023-11-15 NOTE — H&P (Signed)
 [facility] [place]  Patient name: Karen [name] MRN: [id number] DOB: Apr 19, 1955 Sex: female   REASON FOR VISIT:    ESRD  HISOTRY OF PRESENT ILLNESS:    Everette Dimauro is a 68 y.o. female with history of end-stage renal disease.  Most recently she underwent revision of her right arm fistula with thrombectomy and patch repair.  This was on 04/19/2023.  Currently, she is complaining of pain in her right arm and shoulder.  She sees Dr. Marzetta with orthopedics but wanted her access to be evaluated first.   PAST MEDICAL HISTORY:   Past Medical History:  Diagnosis Date   Anemia    Arthritis    knees   Breast cancer (HCC) 2019   Right Breast Cancer   Cancer (HCC)    Chest pain    low risk nuc stress 04/2019   CHF (congestive heart failure) (HCC)    Colitis 12/08/2014   [facility]- focal moderate active colitis   Complication of anesthesia 2005   difficulty remembering for a while and waking up   Constipation    Dysrhythmia    h/o A-Fib   Elevated LFTs 2016   [facility]   ESRD on dialysis [facility]) 04/2014   MWF [place]   Fundic gland polyps of stomach, benign    GERD (gastroesophageal reflux disease)    Heart murmur    Ef 55-60%, no sig valvular abn on echo 07/2020   Hemodialysis patient    Hypertension    Shortness of breath dyspnea    with exertion     FAMILY HISTORY:   Family History  Problem Relation Age of Onset   Kidney disease Mother    Heart attack Father    Kidney disease Father    Diabetes Sister    Hyperlipidemia Sister    Hypertension Sister    Kidney disease Sister        x2    SOCIAL HISTORY:   Social History   Tobacco Use   Smoking status: Never   Smokeless tobacco: Never  Substance Use Topics   Alcohol use: No     ALLERGIES:   No Known Allergies   CURRENT MEDICATIONS:   No current facility-administered medications for this encounter.    REVIEW  OF SYSTEMS:   [X]  denotes positive finding, [ ]  denotes negative finding Cardiac  Comments:  Chest pain or chest pressure:    Shortness of breath upon exertion:    Short of breath when lying flat:    Irregular heart rhythm:        Vascular    Pain in calf, thigh, or hip brought on by ambulation:    Pain in feet at night that wakes you up from your sleep:     Blood clot in your veins:    Leg swelling:         Pulmonary    Oxygen at home:    Productive cough:     Wheezing:         Neurologic    Sudden weakness in arms or legs:     Sudden numbness in arms or legs:     Sudden onset of difficulty speaking or slurred speech:    Temporary loss of vision in one eye:     Problems with dizziness:         Gastrointestinal    Blood in stool:     Vomited blood:  Genitourinary    Burning when urinating:     Blood in urine:        Psychiatric    Major depression:         Hematologic    Bleeding problems:    Problems with blood clotting too easily:        Skin    Rashes or ulcers:        Constitutional    Fever or chills:      PHYSICAL EXAM:   Vitals:   11/15/23 0842 11/15/23 0900  BP: 103/63   Pulse: 83   Resp: 12   Temp: 98.3 F (36.8 C)   TempSrc: Oral   SpO2: 100%   Weight:  82.6 kg  Height:  5' 1 (1.549 m)    GENERAL: The patient is a well-nourished female, in no acute distress. The vital signs are documented above. CARDIAC: There is a regular rate and rhythm.  VASCULAR: Palpable thrill in right arm fistula PULMONARY: Non-labored respirations SKIN: There are no ulcers or rashes noted. PSYCHIATRIC: The patient has a normal affect.  STUDIES:     MEDICAL ISSUES:   ESRD: I discussed proceeding with fistulogram to better evaluate her access and to intervene as indicated.  All questions were answered and she wishes to proceed.    Malvina Serene CLORE, MD, FACS [facility] [phone] Pager [phone]

## 2023-11-15 NOTE — Op Note (Signed)
    Patient name: Karen [name] MRN: [id number] DOB: December 10, 1955 Sex: female  11/15/2023 Pre-operative Diagnosis: ESRD Post-operative diagnosis:  Same Surgeon:  [name] Procedure Performed:  1.  Ultrasound-guided access, right arm fistula  2.  Fistulogram  3.  Venoplasty, peripheral   Indications: This is a 68 year old female who is having trouble with pain in her arm.  She has a history of surgical revision.  She is having trouble with bleeding.  She is here for further evaluation.  Procedure:  The patient was identified in the holding area and taken to room 8.  The patient was then placed supine on the table and prepped and draped in the usual sterile fashion.  A time out was called.  ltrasound was used to evaluate the fistula.  The vein was patent and compressible.  A digital ultrasound image was acquired.  The fistula was then accessed under ultrasound guidance using a micropuncture needle.  An 018 wire was then asvanced without resistance and a micropuncture sheath was placed.  Contrast injections were then performed through the sheath.  Findings: No evidence of central venous stenosis.  The arteriovenous anastomosis was widely patent.  There is a stent in the midportion of the arm and just proximal to this there is a high-grade lesion that is fairly long in length about 4 cm.  The stent is patent without stenosis.   Intervention: After the above images were acquired the decision made to proceed with intervention.  Over a 035 wire, a 6 French sheath was placed.  I selected a 7 x 80 balloon and performed balloon venoplasty.  Follow-up imaging showed adequate result with stenosis less than 20%.  The patient had a significant amount of pain with balloon insufflation and so I elected not to upsize the balloon.  The sheath was removed with a Monocryl  Impression:  #1  Successful balloon venoplasty of a mid arm stenosis using a 7 mm balloon  #2  The patient has significant pain  with any intervention in her arm.  I have considered doing this in the operating room previously.  If she has early recurrence, I would consider stenting the lesion.  ALONSO [name], M.D., [facility] [place] Office: [phone] Pager:  [phone]

## 2024-01-21 ENCOUNTER — Encounter: Payer: Self-pay | Admitting: *Deleted

## 2024-01-21 NOTE — Progress Notes (Signed)
 Karen [name]                                          MRN: [id number]   01/21/2024   The VBCI Quality Team Specialist reviewed this patient medical record for the purposes of chart review for care gap closure. The following were reviewed: abstraction for care gap closure-glycemic status assessment.    VBCI Quality Team

## 2024-01-25 ENCOUNTER — Encounter (HOSPITAL_COMMUNITY)
Disposition: A | Discharge status: Home / Self Care | Payer: Self-pay | Source: Ambulatory Visit | Attending: Vascular Surgery

## 2024-01-25 ENCOUNTER — Ambulatory Visit (HOSPITAL_COMMUNITY)
Admit: 2024-01-25 | Discharge: 2024-01-25 | Disposition: A | Discharge status: Home / Self Care | Source: Ambulatory Visit | Attending: Vascular Surgery | Admitting: Vascular Surgery

## 2024-01-25 ENCOUNTER — Other Ambulatory Visit: Payer: Self-pay

## 2024-01-25 DIAGNOSIS — Y832 Surgical operation with anastomosis, bypass or graft as the cause of abnormal reaction of the patient, or of later complication, without mention of misadventure at the time of the procedure: Secondary | ICD-10-CM | POA: Insufficient documentation

## 2024-01-25 DIAGNOSIS — I509 Heart failure, unspecified: Secondary | ICD-10-CM | POA: Diagnosis not present

## 2024-01-25 DIAGNOSIS — Z992 Dependence on renal dialysis: Secondary | ICD-10-CM | POA: Diagnosis not present

## 2024-01-25 DIAGNOSIS — T82868A Thrombosis of vascular prosthetic devices, implants and grafts, initial encounter: Secondary | ICD-10-CM | POA: Diagnosis not present

## 2024-01-25 DIAGNOSIS — N186 End stage renal disease: Secondary | ICD-10-CM | POA: Insufficient documentation

## 2024-01-25 DIAGNOSIS — I132 Hypertensive heart and chronic kidney disease with heart failure and with stage 5 chronic kidney disease, or end stage renal disease: Secondary | ICD-10-CM | POA: Diagnosis not present

## 2024-01-25 HISTORY — PX: DIALYSIS/PERMA CATHETER INSERTION: CATH118288

## 2024-01-25 SURGERY — DIALYSIS/PERMA CATHETER INSERTION
Anesthesia: LOCAL | Site: Arm Upper | Laterality: Right

## 2024-01-25 MED ORDER — FENTANYL CITRATE (PF) 100 MCG/2ML IJ SOLN
INTRAMUSCULAR | Status: DC | PRN
  Administered 2024-01-25: 25 ug via INTRAVENOUS

## 2024-01-25 MED ORDER — HEPARIN SODIUM (PORCINE) 1000 UNIT/ML IJ SOLN
INTRAMUSCULAR | Status: AC
  Filled 2024-01-25: qty 10

## 2024-01-25 MED ORDER — LIDOCAINE HCL (PF) 1 % IJ SOLN
INTRAMUSCULAR | Status: AC
  Filled 2024-01-25: qty 30

## 2024-01-25 MED ORDER — LIDOCAINE HCL (PF) 1 % IJ SOLN
INTRAMUSCULAR | Status: DC | PRN
  Administered 2024-01-25: 15 mL via INTRADERMAL
  Administered 2024-01-25: 10 mL via INTRADERMAL

## 2024-01-25 MED ORDER — MIDAZOLAM HCL 2 MG/2ML IJ SOLN
INTRAMUSCULAR | Status: AC
  Filled 2024-01-25: qty 2

## 2024-01-25 MED ORDER — HEPARIN (PORCINE) IN NACL 1000-0.9 UT/500ML-% IV SOLN
INTRAVENOUS | Status: DC | PRN
  Administered 2024-01-25: 500 mL

## 2024-01-25 MED ORDER — HEPARIN SODIUM (PORCINE) 1000 UNIT/ML IJ SOLN
INTRAMUSCULAR | Status: DC | PRN
  Administered 2024-01-25: 3200 [IU] via INTRAVENOUS

## 2024-01-25 MED ORDER — FENTANYL CITRATE (PF) 100 MCG/2ML IJ SOLN
INTRAMUSCULAR | Status: AC
  Filled 2024-01-25: qty 2

## 2024-01-25 MED ORDER — MIDAZOLAM HCL (PF) 2 MG/2ML IJ SOLN
INTRAMUSCULAR | Status: DC | PRN
  Administered 2024-01-25: 1 mg via INTRAVENOUS

## 2024-01-25 SURGICAL SUPPLY — 5 items
CATH PALINDROME-P 19CM W/VT (CATHETERS) IMPLANT
KIT MICROPUNCTURE NIT STIFF (SHEATH) IMPLANT
SHEATH PROBE COVER 6X72 (BAG) IMPLANT
TRAY PV CATH (CUSTOM PROCEDURE TRAY) ×2 IMPLANT
TUBING CIL FLEX 10 FLL-RA (TUBING) IMPLANT

## 2024-01-25 NOTE — H&P (Signed)
 " H&P      History of Present Illness: This is a 69 y.o. female with end-stage renal disease that presents for evaluation of thrombosed right arm AV fistula.  This is undergone multiple interventions including previous mechanical thrombectomy with stenting as well as open surgical revision.  States it stopped working today.  Past Medical History:  Diagnosis Date   Anemia    Arthritis    knees   Breast cancer (HCC) 2019   Right Breast Cancer   Cancer (HCC)    Chest pain    low risk nuc stress 04/2019   CHF (congestive heart failure) (HCC)    Colitis 12/08/2014   [facility]- focal moderate active colitis   Complication of anesthesia 2005   difficulty remembering for a while and waking up   Constipation    Dysrhythmia    h/o A-Fib   Elevated LFTs 2016   [facility]   ESRD on dialysis [facility]) 04/2014   MWF [place]   Fundic gland polyps of stomach, benign    GERD (gastroesophageal reflux disease)    Heart murmur    Ef 55-60%, no sig valvular abn on echo 07/2020   Hemodialysis patient    Hypertension    Shortness of breath dyspnea    with exertion    Past Surgical History:  Procedure Laterality Date   A/V FISTULAGRAM Right 04/22/2019   Procedure: A/V FISTULAGRAM - Right Upper;  Surgeon: Serene Gaile ORN, MD;  [facility];  Service: Cardiovascular;  Laterality: Right;   A/V FISTULAGRAM N/A 02/17/2020   Procedure: A/V FISTULAGRAM - Right Upper;  Surgeon: Serene Gaile ORN, MD;  [facility];  Service: Cardiovascular;  Laterality: N/A;   A/V FISTULAGRAM Right 11/23/2020   Procedure: A/V FISTULAGRAM;  Surgeon: Serene Gaile ORN, MD;  [facility];  Service: Cardiovascular;  Laterality: Right;   A/V FISTULAGRAM Right 12/27/2021   Procedure: A/V Fistulagram;  Surgeon: Serene Gaile ORN, MD;  [facility];  Service: Cardiovascular;  Laterality: Right;   A/V FISTULAGRAM Right 09/13/2022   Procedure: A/V  Fistulagram;  Surgeon: Lanis Fonda BRAVO, MD;  Location: [facility] INVASIVE CV LAB;  Service: Cardiovascular;  Laterality: Right;   A/V FISTULAGRAM Right 04/03/2023   Procedure: A/V Fistulagram;  Surgeon: Serene Gaile ORN, MD;  [facility];  Service: Cardiovascular;  Laterality: Right;   A/V FISTULAGRAM Right 11/15/2023   Procedure: A/V Fistulagram;  Surgeon: Serene Gaile ORN, MD;  Location: [facility];  Service: Cardiovascular;  Laterality: Right;   ABDOMINAL HYSTERECTOMY  2005   AV FISTULA PLACEMENT Left 12/09/2013   Procedure: INSERTION OF ARTERIOVENOUS (AV) GORE-TEX GRAFT ARM;  Surgeon: Carlin BRAVO Haddock, MD;  Location: [facility] OR;  Service: Vascular;  Laterality: Left;   AV FISTULA PLACEMENT Right 01/01/2018   Procedure: INSERTION OF ARTERIOVENOUS (AV) GORE-TEX GRAFT ARM RIGHT ARM;  Surgeon: Haddock Carlin BRAVO, MD;  Location: [facility] OR;  Service: Vascular;  Laterality: Right;   AV FISTULA PLACEMENT Right 09/17/2018   Procedure: 1ST STAGE BASILIC ARTERIOVENOUS (AV) FISTULA CREATION RIGHT ARM;  Surgeon: Sheree Penne Bruckner, MD;  Location: [facility] OR;  Service: Vascular;  Laterality: Right;   AVGG REMOVAL Left 02/10/2021   Procedure: REMOVAL OF LEFT ARM DIALYSIS GRAFT AND BRACHIAL ARTERY PATCH ANGIOPLASTY;  Surgeon: Serene Gaile ORN, MD;  [facility];  Service: Vascular;  Laterality: Left;   BASCILIC VEIN TRANSPOSITION Right 11/12/2018   Procedure: SECOND STAGE BASILIC VEIN  TRANSPOSITION RIGHT ARM;  Surgeon: Sheree Penne Bruckner, MD;  Location: [facility] OR;  Service: Vascular;  Laterality: Right;   BREAST BIOPSY  1990's   BUNIONECTOMY Bilateral    CHOLECYSTECTOMY  2005   COLONOSCOPY     CRANIOTOMY Left 08/02/2020   Procedure: CRANIOTOMY HEMATOMA EVACUATION SUBDURAL;  Surgeon: Joshua Alm RAMAN, MD;  Location: [place] OR;  Service: Neurosurgery;  Laterality: Left;   DIALYSIS/PERMA CATHETER INSERTION Right 04/03/2023   Procedure: DIALYSIS/PERMA CATHETER INSERTION;  Surgeon: Serene Gaile ORN, MD;   [facility];  Service: Cardiovascular;  Laterality: Right;   FISTULOGRAM Right 06/12/2019   Procedure: Fistulogram of right upper arm arteriovenous fistula;  Surgeon: Serene Gaile ORN, MD;  Location: [facility] OR;  Service: Vascular;  Laterality: Right;   INSERTION OF ILIAC STENT  06/12/2019   Procedure: Insertion Of right basilic vein Stent;  Surgeon: Serene Gaile ORN, MD;  [facility];  Service: Vascular;;   IR AV DIALY SHUNT INTRO NEEDLE/INTRACATH INITIAL W/PTA/IMG RIGHT Right 03/04/2019   IR GENERIC HISTORICAL  01/11/2016   IR US  GUIDE VASC ACCESS RIGHT 01/11/2016 Ami Bellman, DO [facility]   IR GENERIC HISTORICAL  01/11/2016   IR RADIOLOGY PERIPHERAL GUIDED IV START 01/11/2016 Ami Bellman, DO [facility]   IR IMAGING GUIDED PORT INSERTION  07/05/2017   IR PATIENT EVAL TECH 0-60 MINS  08/08/2023   IR REMOVAL TUN CV CATH W/O FL  08/12/2019   IR REMOVAL TUN CV CATH W/O FL  07/03/2023   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   KNEE ARTHROSCOPY Bilateral    MASTECTOMY Right    MASTECTOMY W/ SENTINEL NODE BIOPSY Right 11/20/2017   MASTECTOMY W/ SENTINEL NODE BIOPSY Right 11/20/2017   Procedure: RIGHT MASTECTOMY WITH SENTINEL LYMPH NODE BIOPSY;  Surgeon: Vernetta Berg, MD;  [facility];  Service: General;  Laterality: Right;   PATCH ANGIOPLASTY Right 04/19/2023   Procedure: CYDNEY LISLE;  Surgeon: Serene Gaile ORN, MD;  [facility];  Service: Vascular;  Laterality: Right;   PERIPHERAL VASCULAR BALLOON ANGIOPLASTY  02/17/2020   Procedure: PERIPHERAL VASCULAR BALLOON ANGIOPLASTY;  Surgeon: Serene Gaile ORN, MD;  [facility];  Service: Cardiovascular;;   PERIPHERAL VASCULAR BALLOON ANGIOPLASTY  11/23/2020   Procedure: PERIPHERAL VASCULAR BALLOON ANGIOPLASTY;  Surgeon: Serene Gaile ORN, MD;  [facility];  Service: Cardiovascular;;   PERIPHERAL VASCULAR BALLOON ANGIOPLASTY  12/27/2021   Procedure: PERIPHERAL VASCULAR BALLOON  ANGIOPLASTY;  Surgeon: Serene Gaile ORN, MD;  [facility];  Service: Cardiovascular;;   PERIPHERAL VASCULAR BALLOON ANGIOPLASTY Right 09/13/2022   Procedure: PERIPHERAL VASCULAR BALLOON ANGIOPLASTY;  Surgeon: Lanis Fonda BRAVO, MD;  Location: [facility] INVASIVE CV LAB;  Service: Cardiovascular;  Laterality: Right;   PERIPHERAL VASCULAR CATHETERIZATION N/A 08/31/2015   Procedure: A/V Shuntogram;  Surgeon: Gaile ORN Serene, MD;  [facility];  Service: Cardiovascular;  Laterality: N/A;   PERIPHERAL VASCULAR CATHETERIZATION Left 08/31/2015   Procedure: Peripheral Vascular Balloon Angioplasty;  Surgeon: Gaile ORN Serene, MD;  [facility];  Service: Cardiovascular;  Laterality: Left;  arm fistula   PERIPHERAL VASCULAR INTERVENTION Right 04/22/2019   Procedure: PERIPHERAL VASCULAR INTERVENTION;  Surgeon: Serene Gaile ORN, MD;  [facility];  Service: Cardiovascular;  Laterality: Right;  FISTULA   PERIPHERAL VASCULAR THROMBECTOMY Right 04/03/2023   Procedure: PERIPHERAL VASCULAR THROMBECTOMY;  Surgeon: Serene Gaile ORN, MD;  [facility];  Service: Cardiovascular;  Laterality: Right;  PORT-A-CATH REMOVAL N/A 08/12/2018   Procedure: PORT-A-CATH REMOVAL;  Surgeon: Vernetta Berg, MD;  Location: [facility] OR;  Service: General;  Laterality: N/A;   REVISON OF ARTERIOVENOUS FISTULA Right 04/19/2023   Procedure: REVISON OF ARTERIOVENOUS FISTULA;  Surgeon: Serene Gaile ORN, MD;  [facility];  Service: Vascular;  Laterality: Right;   ROTATOR CUFF REPAIR Right 1997   UPPER EXTREMITY INTERVENTION Right 04/03/2023   Procedure: UPPER EXTREMITY INTERVENTION;  Surgeon: Serene Gaile ORN, MD;  [facility];  Service: Cardiovascular;  Laterality: Right;   UPPER EXTREMITY VENOGRAPHY Right 12/24/2017   Procedure: UPPER EXTREMITY VENOGRAPHY CENTRAL VENOGRAM;  Surgeon: Sheree Penne Bruckner, MD;  Location: [facility] INVASIVE CV LAB;   Service: Cardiovascular;  Laterality: Right;   UPPER EXTREMITY VENOGRAPHY Bilateral 09/02/2018   Procedure: UPPER EXTREMITY VENOGRAPHY;  Surgeon: Sheree Penne Bruckner, MD;  Location: [facility] INVASIVE CV LAB;  Service: Cardiovascular;  Laterality: Bilateral;   VENOUS ANGIOPLASTY  11/15/2023   Procedure: VENOUS ANGIOPLASTY;  Surgeon: Serene Gaile ORN, MD;  Location: [facility];  Service: Cardiovascular;;    Allergies[1]  Prior to Admission medications  Medication Sig Start Date End Date Taking? Authorizing Provider  amoxicillin-clavulanate (AUGMENTIN) 500-125 MG tablet Take 1 tablet by mouth daily. 1 tablet after dialysis for coughing Patient not taking: Reported on 05/21/2023    [provider]  atorvastatin  (LIPITOR) 10 MG tablet TAKE 1 TABLET BY MOUTH EVERY DAY IN THE EVENING 08/24/23   Court Dorn PARAS, MD  benzonatate  (TESSALON ) 100 MG capsule Take 100 mg by mouth as needed for cough.    [provider]  clobetasol cream (TEMOVATE) 0.05 % Apply 1 application topically daily as needed (for rash).     [provider]  diltiazem  (CARDIZEM  CD) 120 MG 24 hr capsule TAKE 1 CAPSULE (120 MG TOTAL) BY MOUTH EVERY TUESDAY, THURSDAY, SATURDAY, AND SUNDAY AT 6 PM. 05/08/23   Court Dorn PARAS, MD  diltiazem  (CARDIZEM ) 30 MG tablet TAKE 1 TABLET (30 MG TOTAL) BY MOUTH EVERY MONDAY, WEDNESDAY, AND FRIDAY. 10/24/23   Court Dorn PARAS, MD  Doxercalciferol (HECTOROL IV) Doxercalciferol (Hectorol) 09/01/22 03/28/24  [provider]  ethyl chloride spray Apply 1 application topically every Monday, Wednesday, and Friday with hemodialysis.    [provider]  fluticasone (FLONASE) 50 MCG/ACT nasal spray Place 1 spray into both nostrils daily as needed for allergies.    [provider]  lidocaine -prilocaine  (EMLA ) cream Apply 1 application topically See admin instructions. Apply topically Monday, Wednesday and Friday before dialysis 05/26/15   [provider]   midodrine  (PROAMATINE ) 10 MG tablet Take 2 tablets (20 mg total) by mouth every Monday, Wednesday, and Friday. Prior to dialysis 08/20/20   Love, Sharlet RAMAN, PA-C  multivitamin (RENA-VIT) TABS tablet Take 1 tablet by mouth in the morning.    [provider]  ondansetron  (ZOFRAN ) 8 MG tablet Take 1 tablet (8 mg total) by mouth every 8 (eight) hours as needed for nausea or vomiting. 08/17/20   Love, Sharlet RAMAN, PA-C  oxyCODONE -acetaminophen  (PERCOCET/ROXICET) 5-325 MG tablet Take 1 tablet by mouth every 6 (six) hours as needed. Patient not taking: Reported on 05/21/2023 04/19/23   Gerome Maurilio HERO, PA-C  prochlorperazine  (COMPAZINE ) 10 MG tablet Take 10 mg by mouth every 6 (six) hours as needed for nausea or vomiting. 08/21/18 03/29/27  Gudena, Vinay, MD  sevelamer  carbonate (RENVELA ) 800 MG tablet Take 1 tablet (800 mg total) by mouth 3 (three) times daily with meals. Patient taking differently:  Take 1,600-3,200 mg by mouth See admin instructions. Take 4 tablets (3200 mg) by mouth with each meal and take 2 tablets (1600 mg) by mouth with each snack 08/20/20   Love, Sharlet RAMAN, PA-C    Social History   Socioeconomic History   Marital status: Married    Spouse name: Not on file   Number of children: 2   Years of education: Not on file   Highest education level: Not on file  Occupational History   Occupation: SHERIFF'S OFFICE    Employer: [employer]  Tobacco Use   Smoking status: Never   Smokeless tobacco: Never  Vaping Use   Vaping status: Never Used  Substance and Sexual Activity   Alcohol use: No   Drug use: No   Sexual activity: Not on file    Comment: Hysterectomy  Other Topics Concern   Not on file  Social History Narrative   Not on file   Social Drivers of Health   Tobacco Use: Low Risk (05/21/2023)   Patient History    Smoking Tobacco Use: Never    Smokeless Tobacco Use: Never    Passive Exposure: Not on file  Financial Resource Strain: Not on file  Food Insecurity: Not on  file  Transportation Needs: Not on file  Physical Activity: Not on file  Stress: Not on file  Social Connections: Not on file  Intimate Partner Violence: Not on file  Depression (PHQ2-9): Low Risk (03/02/2021)   Depression (PHQ2-9)    PHQ-2 Score: 0  Alcohol Screen: Not on file  Housing: Not on file  Utilities: Not on file  Health Literacy: Not on file     Family History  Problem Relation Age of Onset   Kidney disease Mother    Heart attack Father    Kidney disease Father    Diabetes Sister    Hyperlipidemia Sister    Hypertension Sister    Kidney disease Sister        x2    ROS: [x]  Positive   [ ]  Negative   [ ]  All sytems reviewed and are negative  Cardiovascular: []  chest pain/pressure []  palpitations []  SOB lying flat []  DOE []  pain in legs while walking []  pain in legs at rest []  pain in legs at night []  non-healing ulcers []  hx of DVT []  swelling in legs  Pulmonary: []  productive cough []  asthma/wheezing []  home O2  Neurologic: []  weakness in []  arms []  legs []  numbness in []  arms []  legs []  hx of CVA []  mini stroke [] difficulty speaking or slurred speech []  temporary loss of vision in one eye []  dizziness  Hematologic: []  hx of cancer []  bleeding problems []  problems with blood clotting easily  Endocrine:   []  diabetes []  thyroid  disease  GI []  vomiting blood []  blood in stool  GU: []  CKD/renal failure []  HD--[]  M/W/F or []  T/T/S []  burning with urination []  blood in urine  Psychiatric: []  anxiety []  depression  Musculoskeletal: []  arthritis []  joint pain  Integumentary: []  rashes []  ulcers  Constitutional: []  fever []  chills   Physical Examination  Vitals:   01/25/24 0901 01/25/24 0935  BP: 136/79 126/71  Pulse: 79 75  Resp: 12 14  Temp: 98 F (36.7 C)   SpO2: 99% 100%   There is no height or weight on file to calculate BMI.  General:  WDWN in NAD Gait: Not observed HENT: WNL, normocephalic Pulmonary: normal  non-labored breathing Cardiac: regular, without  Murmurs, rubs or  gallops Abdomen:  soft, NT/ND Vascular Exam/Pulses: Right arm AV fistula with no appreciable thrill   CBC    Component Value Date/Time   WBC 4.3 04/25/2023 1250   RBC 2.56 (L) 04/25/2023 1250   HGB 8.0 (L) 04/25/2023 1250   HGB 9.6 (L) 08/06/2018 0751   HCT 26.1 (L) 04/25/2023 1250   PLT 144 (L) 04/25/2023 1250   PLT 176 08/06/2018 0751   MCV 102.0 (H) 04/25/2023 1250   MCH 31.3 04/25/2023 1250   MCHC 30.7 04/25/2023 1250   RDW 16.3 (H) 04/25/2023 1250   LYMPHSABS 0.5 (L) 04/03/2023 2054   MONOABS 0.8 04/03/2023 2054   EOSABS 0.1 04/03/2023 2054   BASOSABS 0.1 04/03/2023 2054    BMET    Component Value Date/Time   NA 140 04/25/2023 1250   K 3.7 04/25/2023 1250   CL 99 04/25/2023 1250   CO2 28 04/25/2023 1250   GLUCOSE 68 (L) 04/25/2023 1250   BUN 14 04/25/2023 1250   CREATININE 4.49 (H) 04/25/2023 1250   CREATININE 7.25 (HH) 08/06/2018 0751   CREATININE 5.71 (H) 02/27/2014 0900   CALCIUM  8.1 (L) 04/25/2023 1250   CALCIUM  9.0 01/28/2014 1454   GFRNONAA 10 (L) 04/25/2023 1250   GFRNONAA 5 (L) 08/06/2018 0751   GFRAA 5 (L) 04/15/2019 1709   GFRAA 6 (L) 08/06/2018 0751    COAGS: Lab Results  Component Value Date   INR 1.1 08/02/2020   INR 1.00 07/05/2017   INR 1.16 12/18/2014     Non-Invasive Vascular Imaging:      ASSESSMENT/PLAN: This is a 69 y.o. female with end-stage renal disease that presents for evaluation of thrombosed right arm AV fistula.  This is undergone multiple interventions including previous mechanical thrombectomy with stenting as well as open surgical revision.  Discussed I do not think we have any opportunity to further salvage the fistula if this is thrombosed.  Has had been prior revisions and stenting.  Will evaluate ultrasound otherwise will require tunneled dialysis catheter placement and new access as we discussed.  Lonni DOROTHA Gaskins, MD [facility] [place] Office: [phone]  Lonni JINNY Gaskins     [1] No Known Allergies  "

## 2024-01-25 NOTE — Op Note (Signed)
" ° ° °  Patient name: Karen [name] MRN: [id number] DOB: 1955-07-22 Sex: female  01/25/2024 Pre-operative Diagnosis: Thrombosed right arm brachiobasilic fistula with need for permanent hemodialysis access Post-operative diagnosis:  Same Surgeon:  Lonni DOROTHA Gaskins, MD Procedure Performed: 1.  Ultrasound-guided access right internal jugular vein 2.  Placement of right internal jugular vein tunneled dialysis catheter (19 cm palindrome) 3.  10 minutes of monitored moderate conscious sedation time  Indications: Patient is a 69 year old female with end-stage renal disease currently using a right basilic vein fistula has had multiple revisions including open thrombectomy and stenting.  She presents today with a thrombosed fistula.  I recommended likely catheter placement after risk-benefits discussed.  Findings:   I did evaluate her right arm basilic vein fistula with ultrasound at bedside in the PV lab and this was thrombosed with acute thrombus.  Ultimately elected for catheter placement.    Ultrasound-guided access right internal jugular vein.  Versed  fentanyl  was given for conscious moderate sedation.  A 19 cm palindrome catheter was placed with the tip in the right atrium through the right IJ under fluoroscopic guidance and this flushed and aspirated easily.   Procedure:  The patient was identified in the holding area and taken to [facility] PV lab.  Placed on the table in supine position.  Timeout was performed and the right neck and chest wall were prepped and draped in standard sterile fashion.  I used ultrasound to evaluate the right internal jugular vein, it was patent, an image was saved.  I then injected 1% lidocaine  without epinephrine  in the right neck and chest wall for a total of about 25 mL for local anesthesia.  The right internal jugular vein was accessed with micro access needle placed a micro and a micro sheath under US  guidance.  We gave her 25 mcg of fentanyl  and 1 mg Versed  for  conscious moderate sedation.  Vital signs were monitored including heart rate, respiratory rate, oxygenation and blood pressure.  We then measured a 19 cm palindrome catheter on the chest wall.  I made a counterincision on the right chest and then tunneled the catheter from the chest wall exit site to the IJ stick site.  I then dilated over the wire after advancing a J-wire through the microcatheter in the right IJ into the IVC.  I removed the wire and inner dilator and the catheter was then advanced through the sheath that was peeled away.  This was positioned under fluoroscopy so it was not kinked.  Flushed and aspirated easily.  It was loaded with heparin  according Manufactures recommendations.  It was then secured with 2-0 nylon in the chest wall and 4-0 Monocryl in the neck and Dermabond.  Plan:  Will arrange office visit to discuss permanent dialysis access.  She would like to see Dr. Serene who has done her prior access.       Lonni DOROTHA Gaskins, MD [facility] [place] Office: [phone]   "

## 2024-01-28 ENCOUNTER — Encounter: Payer: Self-pay | Admitting: Surgery

## 2024-01-28 ENCOUNTER — Ambulatory Visit: Attending: Surgery | Admitting: Surgery

## 2024-01-28 ENCOUNTER — Other Ambulatory Visit: Payer: Self-pay

## 2024-01-28 VITALS — BP 95/65 | HR 77 | Temp 98.7°F | Resp 22 | Ht 61.0 in | Wt 184.1 lb

## 2024-01-28 DIAGNOSIS — N186 End stage renal disease: Secondary | ICD-10-CM

## 2024-01-28 DIAGNOSIS — Z992 Dependence on renal dialysis: Secondary | ICD-10-CM

## 2024-01-28 NOTE — H&P (View-Only) (Signed)
 "                                    [facility] [place]  Patient name: Karen [name] MRN: [id number] DOB: Apr 03, 1955 Sex: female   REASON FOR VISIT:    Dialysis access  HISOTRY OF PRESENT ILLNESS:    Karen [name] is a 69 y.o. female who has had multiple bilateral upper extremity accesses.  Most recently in April 2025 I performed thrombectomy of her fistula with patch angioplasty.  This recently occluded and she had a catheter placed.  She is here today to discuss new access   PAST MEDICAL HISTORY:   Past Medical History:  Diagnosis Date   Anemia    Arthritis    knees   Breast cancer (HCC) 2019   Right Breast Cancer   Cancer (HCC)    Chest pain    low risk nuc stress 04/2019   CHF (congestive heart failure) (HCC)    Colitis 12/08/2014   [facility]- focal moderate active colitis   Complication of anesthesia 2005   difficulty remembering for a while and waking up   Constipation    Dysrhythmia    h/o A-Fib   Elevated LFTs 2016   [facility]   ESRD on dialysis [facility]) 04/2014   MWF [place]   Fundic gland polyps of stomach, benign    GERD (gastroesophageal reflux disease)    Heart murmur    Ef 55-60%, no sig valvular abn on echo 07/2020   Hemodialysis patient    Hypertension    Shortness of breath dyspnea    with exertion     FAMILY HISTORY:   Family History  Problem Relation Age of Onset   Kidney disease Mother    Heart attack Father    Kidney disease Father    Diabetes Sister    Hyperlipidemia Sister    Hypertension Sister    Kidney disease Sister        x2    SOCIAL HISTORY:   Social History   Tobacco Use   Smoking status: Never   Smokeless tobacco: Never  Substance Use Topics   Alcohol use: No     ALLERGIES:   Allergies[1]   CURRENT MEDICATIONS:   Current Outpatient Medications  Medication Sig Dispense Refill   atorvastatin  (LIPITOR) 10 MG tablet TAKE 1 TABLET BY MOUTH EVERY DAY IN THE  EVENING 90 tablet 1   benzonatate  (TESSALON ) 100 MG capsule Take 100 mg by mouth as needed for cough.     clobetasol cream (TEMOVATE) 0.05 % Apply 1 application topically daily as needed (for rash).      diltiazem  (CARDIZEM  CD) 120 MG 24 hr capsule TAKE 1 CAPSULE (120 MG TOTAL) BY MOUTH EVERY TUESDAY, THURSDAY, SATURDAY, AND SUNDAY AT 6 PM. 48 capsule 3   diltiazem  (CARDIZEM ) 30 MG tablet TAKE 1 TABLET (30 MG TOTAL) BY MOUTH EVERY MONDAY, WEDNESDAY, AND FRIDAY. 36 tablet 1   Doxercalciferol (HECTOROL IV) Doxercalciferol (Hectorol)     ethyl chloride spray Apply 1 application topically every Monday, Wednesday, and Friday with hemodialysis.     fluticasone (FLONASE) 50 MCG/ACT nasal spray Place 1 spray into both nostrils daily as needed for allergies.     lidocaine -prilocaine  (EMLA ) cream Apply 1 application topically See admin instructions. Apply topically Monday, Wednesday and Friday before dialysis  6   midodrine  (PROAMATINE ) 10 MG tablet Take  2 tablets (20 mg total) by mouth every Monday, Wednesday, and Friday. Prior to dialysis 28 tablet 0   multivitamin (RENA-VIT) TABS tablet Take 1 tablet by mouth in the morning.     ondansetron  (ZOFRAN ) 8 MG tablet Take 1 tablet (8 mg total) by mouth every 8 (eight) hours as needed for nausea or vomiting. 20 tablet 0   prochlorperazine  (COMPAZINE ) 10 MG tablet Take 10 mg by mouth every 6 (six) hours as needed for nausea or vomiting.     sevelamer  carbonate (RENVELA ) 800 MG tablet Take 1 tablet (800 mg total) by mouth 3 (three) times daily with meals. (Patient taking differently: Take 1,600-3,200 mg by mouth See admin instructions. Take 4 tablets (3200 mg) by mouth with each meal and take 2 tablets (1600 mg) by mouth with each snack)     Current Facility-Administered Medications  Medication Dose Route Frequency Provider Last Rate Last Admin   0.9 %  sodium chloride  infusion  250 mL Intravenous PRN Serene Gaile ORN, MD       0.9 %  sodium chloride  infusion  250  mL Intravenous PRN Robins, Joshua E, MD       sodium chloride  flush (NS) 0.9 % injection 3 mL  3 mL Intravenous Q12H Serene Gaile ORN, MD       sodium chloride  flush (NS) 0.9 % injection 3 mL  3 mL Intravenous Q12H Lanis Fonda BRAVO, MD       sodium chloride  flush (NS) 0.9 % injection 3 mL  3 mL Intravenous PRN Serene Gaile ORN, MD        REVIEW OF SYSTEMS:   [X]  denotes positive finding, [ ]  denotes negative finding Cardiac  Comments:  Chest pain or chest pressure:    Shortness of breath upon exertion:    Short of breath when lying flat:    Irregular heart rhythm:        Vascular    Pain in calf, thigh, or hip brought on by ambulation:    Pain in feet at night that wakes you up from your sleep:     Blood clot in your veins:    Leg swelling:         Pulmonary    Oxygen at home:    Productive cough:     Wheezing:         Neurologic    Sudden weakness in arms or legs:     Sudden numbness in arms or legs:     Sudden onset of difficulty speaking or slurred speech:    Temporary loss of vision in one eye:     Problems with dizziness:         Gastrointestinal    Blood in stool:     Vomited blood:         Genitourinary    Burning when urinating:     Blood in urine:        Psychiatric    Major depression:         Hematologic    Bleeding problems:    Problems with blood clotting too easily:        Skin    Rashes or ulcers:        Constitutional    Fever or chills:      PHYSICAL EXAM:   Vitals:   01/28/24 1127  BP: 95/65  Pulse: 77  Resp: (!) 22  Temp: 98.7 F (37.1 C)  TempSrc: Temporal  SpO2: 100%  Weight: 184 lb  1.6 oz (83.5 kg)  Height: 5' 1 (1.549 m)    GENERAL: The patient is a well-nourished female, in no acute distress. The vital signs are documented above. CARDIAC: There is a regular rate and rhythm.  VASCULAR: SonoSite shows a brachial vein in the right arm at the axilla that does appear to be patent PULMONARY: Non-labored respirations SKIN:  There are no ulcers or rashes noted. PSYCHIATRIC: The patient has a normal affect.  STUDIES:     MEDICAL ISSUES:   ESRD: The patient is nearing the end of options for upper extremity access.  I think that she can have a right upper arm graft however I want to confirm that she has a good vein to tie into it that is patent into the central venous system.  On SonoSite ultrasound there is a brachial vein in the axilla that is patent.  I think she needs to have bilateral upper extremity venograms to confirm that she has options in the upper arm.  If not we will need to consider leg access.  After her venogram if she has a patent central system on the right, she can be scheduled for a right upper arm graft    [name], IV, MD, FACS [facility] [phone] Pager [phone]     [1] No Known Allergies  "

## 2024-01-28 NOTE — H&P (View-Only) (Signed)
 "                                    [facility] [place]  Patient name: Karen [name] MRN: [id number] DOB: Apr 03, 1955 Sex: female   REASON FOR VISIT:    Dialysis access  HISOTRY OF PRESENT ILLNESS:    [name] is a 69 y.o. female who has had multiple bilateral upper extremity accesses.  Most recently in April 2025 I performed thrombectomy of her fistula with patch angioplasty.  This recently occluded and she had a catheter placed.  She is here today to discuss new access   PAST MEDICAL HISTORY:   Past Medical History:  Diagnosis Date   Anemia    Arthritis    knees   Breast cancer (HCC) 2019   Right Breast Cancer   Cancer (HCC)    Chest pain    low risk nuc stress 04/2019   CHF (congestive heart failure) (HCC)    Colitis 12/08/2014   [facility]- focal moderate active colitis   Complication of anesthesia 2005   difficulty remembering for a while and waking up   Constipation    Dysrhythmia    h/o A-Fib   Elevated LFTs 2016   [facility]   ESRD on dialysis [facility]) 04/2014   MWF [place]   Fundic gland polyps of stomach, benign    GERD (gastroesophageal reflux disease)    Heart murmur    Ef 55-60%, no sig valvular abn on echo 07/2020   Hemodialysis patient    Hypertension    Shortness of breath dyspnea    with exertion     FAMILY HISTORY:   Family History  Problem Relation Age of Onset   Kidney disease Mother    Heart attack Father    Kidney disease Father    Diabetes Sister    Hyperlipidemia Sister    Hypertension Sister    Kidney disease Sister        x2    SOCIAL HISTORY:   Social History   Tobacco Use   Smoking status: Never   Smokeless tobacco: Never  Substance Use Topics   Alcohol use: No     ALLERGIES:   Allergies[1]   CURRENT MEDICATIONS:   Current Outpatient Medications  Medication Sig Dispense Refill   atorvastatin  (LIPITOR) 10 MG tablet TAKE 1 TABLET BY MOUTH EVERY DAY IN THE  EVENING 90 tablet 1   benzonatate  (TESSALON ) 100 MG capsule Take 100 mg by mouth as needed for cough.     clobetasol cream (TEMOVATE) 0.05 % Apply 1 application topically daily as needed (for rash).      diltiazem  (CARDIZEM  CD) 120 MG 24 hr capsule TAKE 1 CAPSULE (120 MG TOTAL) BY MOUTH EVERY TUESDAY, THURSDAY, SATURDAY, AND SUNDAY AT 6 PM. 48 capsule 3   diltiazem  (CARDIZEM ) 30 MG tablet TAKE 1 TABLET (30 MG TOTAL) BY MOUTH EVERY MONDAY, WEDNESDAY, AND FRIDAY. 36 tablet 1   Doxercalciferol (HECTOROL IV) Doxercalciferol (Hectorol)     ethyl chloride spray Apply 1 application topically every Monday, Wednesday, and Friday with hemodialysis.     fluticasone (FLONASE) 50 MCG/ACT nasal spray Place 1 spray into both nostrils daily as needed for allergies.     lidocaine -prilocaine  (EMLA ) cream Apply 1 application topically See admin instructions. Apply topically Monday, Wednesday and Friday before dialysis  6   midodrine  (PROAMATINE ) 10 MG tablet Take  2 tablets (20 mg total) by mouth every Monday, Wednesday, and Friday. Prior to dialysis 28 tablet 0   multivitamin (RENA-VIT) TABS tablet Take 1 tablet by mouth in the morning.     ondansetron  (ZOFRAN ) 8 MG tablet Take 1 tablet (8 mg total) by mouth every 8 (eight) hours as needed for nausea or vomiting. 20 tablet 0   prochlorperazine  (COMPAZINE ) 10 MG tablet Take 10 mg by mouth every 6 (six) hours as needed for nausea or vomiting.     sevelamer  carbonate (RENVELA ) 800 MG tablet Take 1 tablet (800 mg total) by mouth 3 (three) times daily with meals. (Patient taking differently: Take 1,600-3,200 mg by mouth See admin instructions. Take 4 tablets (3200 mg) by mouth with each meal and take 2 tablets (1600 mg) by mouth with each snack)     Current Facility-Administered Medications  Medication Dose Route Frequency Provider Last Rate Last Admin   0.9 %  sodium chloride  infusion  250 mL Intravenous PRN Serene Gaile ORN, MD       0.9 %  sodium chloride  infusion  250  mL Intravenous PRN Robins, Joshua E, MD       sodium chloride  flush (NS) 0.9 % injection 3 mL  3 mL Intravenous Q12H Serene Gaile ORN, MD       sodium chloride  flush (NS) 0.9 % injection 3 mL  3 mL Intravenous Q12H Lanis Fonda BRAVO, MD       sodium chloride  flush (NS) 0.9 % injection 3 mL  3 mL Intravenous PRN Serene Gaile ORN, MD        REVIEW OF SYSTEMS:   [X]  denotes positive finding, [ ]  denotes negative finding Cardiac  Comments:  Chest pain or chest pressure:    Shortness of breath upon exertion:    Short of breath when lying flat:    Irregular heart rhythm:        Vascular    Pain in calf, thigh, or hip brought on by ambulation:    Pain in feet at night that wakes you up from your sleep:     Blood clot in your veins:    Leg swelling:         Pulmonary    Oxygen at home:    Productive cough:     Wheezing:         Neurologic    Sudden weakness in arms or legs:     Sudden numbness in arms or legs:     Sudden onset of difficulty speaking or slurred speech:    Temporary loss of vision in one eye:     Problems with dizziness:         Gastrointestinal    Blood in stool:     Vomited blood:         Genitourinary    Burning when urinating:     Blood in urine:        Psychiatric    Major depression:         Hematologic    Bleeding problems:    Problems with blood clotting too easily:        Skin    Rashes or ulcers:        Constitutional    Fever or chills:      PHYSICAL EXAM:   Vitals:   01/28/24 1127  BP: 95/65  Pulse: 77  Resp: (!) 22  Temp: 98.7 F (37.1 C)  TempSrc: Temporal  SpO2: 100%  Weight: 184 lb  1.6 oz (83.5 kg)  Height: 5' 1 (1.549 m)    GENERAL: The patient is a well-nourished female, in no acute distress. The vital signs are documented above. CARDIAC: There is a regular rate and rhythm.  VASCULAR: SonoSite shows a brachial vein in the right arm at the axilla that does appear to be patent PULMONARY: Non-labored respirations SKIN:  There are no ulcers or rashes noted. PSYCHIATRIC: The patient has a normal affect.  STUDIES:     MEDICAL ISSUES:   ESRD: The patient is nearing the end of options for upper extremity access.  I think that she can have a right upper arm graft however I want to confirm that she has a good vein to tie into it that is patent into the central venous system.  On SonoSite ultrasound there is a brachial vein in the axilla that is patent.  I think she needs to have bilateral upper extremity venograms to confirm that she has options in the upper arm.  If not we will need to consider leg access.  After her venogram if she has a patent central system on the right, she can be scheduled for a right upper arm graft    [name], IV, MD, FACS [facility] [phone] Pager [phone]     [1] No Known Allergies  "

## 2024-01-28 NOTE — Progress Notes (Signed)
 "                                    [facility] [place]  Patient name: Karen [name] MRN: [id number] DOB: Apr 03, 1955 Sex: female   REASON FOR VISIT:    Dialysis access  HISOTRY OF PRESENT ILLNESS:    Karen [name] is a 69 y.o. female who has had multiple bilateral upper extremity accesses.  Most recently in April 2025 I performed thrombectomy of her fistula with patch angioplasty.  This recently occluded and she had a catheter placed.  She is here today to discuss new access   PAST MEDICAL HISTORY:   Past Medical History:  Diagnosis Date   Anemia    Arthritis    knees   Breast cancer (HCC) 2019   Right Breast Cancer   Cancer (HCC)    Chest pain    low risk nuc stress 04/2019   CHF (congestive heart failure) (HCC)    Colitis 12/08/2014   [facility]- focal moderate active colitis   Complication of anesthesia 2005   difficulty remembering for a while and waking up   Constipation    Dysrhythmia    h/o A-Fib   Elevated LFTs 2016   [facility]   ESRD on dialysis [facility]) 04/2014   MWF [place]   Fundic gland polyps of stomach, benign    GERD (gastroesophageal reflux disease)    Heart murmur    Ef 55-60%, no sig valvular abn on echo 07/2020   Hemodialysis patient    Hypertension    Shortness of breath dyspnea    with exertion     FAMILY HISTORY:   Family History  Problem Relation Age of Onset   Kidney disease Mother    Heart attack Father    Kidney disease Father    Diabetes Sister    Hyperlipidemia Sister    Hypertension Sister    Kidney disease Sister        x2    SOCIAL HISTORY:   Social History   Tobacco Use   Smoking status: Never   Smokeless tobacco: Never  Substance Use Topics   Alcohol use: No     ALLERGIES:   Allergies[1]   CURRENT MEDICATIONS:   Current Outpatient Medications  Medication Sig Dispense Refill   atorvastatin  (LIPITOR) 10 MG tablet TAKE 1 TABLET BY MOUTH EVERY DAY IN THE  EVENING 90 tablet 1   benzonatate  (TESSALON ) 100 MG capsule Take 100 mg by mouth as needed for cough.     clobetasol cream (TEMOVATE) 0.05 % Apply 1 application topically daily as needed (for rash).      diltiazem  (CARDIZEM  CD) 120 MG 24 hr capsule TAKE 1 CAPSULE (120 MG TOTAL) BY MOUTH EVERY TUESDAY, THURSDAY, SATURDAY, AND SUNDAY AT 6 PM. 48 capsule 3   diltiazem  (CARDIZEM ) 30 MG tablet TAKE 1 TABLET (30 MG TOTAL) BY MOUTH EVERY MONDAY, WEDNESDAY, AND FRIDAY. 36 tablet 1   Doxercalciferol (HECTOROL IV) Doxercalciferol (Hectorol)     ethyl chloride spray Apply 1 application topically every Monday, Wednesday, and Friday with hemodialysis.     fluticasone (FLONASE) 50 MCG/ACT nasal spray Place 1 spray into both nostrils daily as needed for allergies.     lidocaine -prilocaine  (EMLA ) cream Apply 1 application topically See admin instructions. Apply topically Monday, Wednesday and Friday before dialysis  6   midodrine  (PROAMATINE ) 10 MG tablet Take  2 tablets (20 mg total) by mouth every Monday, Wednesday, and Friday. Prior to dialysis 28 tablet 0   multivitamin (RENA-VIT) TABS tablet Take 1 tablet by mouth in the morning.     ondansetron  (ZOFRAN ) 8 MG tablet Take 1 tablet (8 mg total) by mouth every 8 (eight) hours as needed for nausea or vomiting. 20 tablet 0   prochlorperazine  (COMPAZINE ) 10 MG tablet Take 10 mg by mouth every 6 (six) hours as needed for nausea or vomiting.     sevelamer  carbonate (RENVELA ) 800 MG tablet Take 1 tablet (800 mg total) by mouth 3 (three) times daily with meals. (Patient taking differently: Take 1,600-3,200 mg by mouth See admin instructions. Take 4 tablets (3200 mg) by mouth with each meal and take 2 tablets (1600 mg) by mouth with each snack)     Current Facility-Administered Medications  Medication Dose Route Frequency Provider Last Rate Last Admin   0.9 %  sodium chloride  infusion  250 mL Intravenous PRN Serene Gaile ORN, MD       0.9 %  sodium chloride  infusion  250  mL Intravenous PRN Robins, Joshua E, MD       sodium chloride  flush (NS) 0.9 % injection 3 mL  3 mL Intravenous Q12H Serene Gaile ORN, MD       sodium chloride  flush (NS) 0.9 % injection 3 mL  3 mL Intravenous Q12H Lanis Fonda BRAVO, MD       sodium chloride  flush (NS) 0.9 % injection 3 mL  3 mL Intravenous PRN Serene Gaile ORN, MD        REVIEW OF SYSTEMS:   [X]  denotes positive finding, [ ]  denotes negative finding Cardiac  Comments:  Chest pain or chest pressure:    Shortness of breath upon exertion:    Short of breath when lying flat:    Irregular heart rhythm:        Vascular    Pain in calf, thigh, or hip brought on by ambulation:    Pain in feet at night that wakes you up from your sleep:     Blood clot in your veins:    Leg swelling:         Pulmonary    Oxygen at home:    Productive cough:     Wheezing:         Neurologic    Sudden weakness in arms or legs:     Sudden numbness in arms or legs:     Sudden onset of difficulty speaking or slurred speech:    Temporary loss of vision in one eye:     Problems with dizziness:         Gastrointestinal    Blood in stool:     Vomited blood:         Genitourinary    Burning when urinating:     Blood in urine:        Psychiatric    Major depression:         Hematologic    Bleeding problems:    Problems with blood clotting too easily:        Skin    Rashes or ulcers:        Constitutional    Fever or chills:      PHYSICAL EXAM:   Vitals:   01/28/24 1127  BP: 95/65  Pulse: 77  Resp: (!) 22  Temp: 98.7 F (37.1 C)  TempSrc: Temporal  SpO2: 100%  Weight: 184 lb  1.6 oz (83.5 kg)  Height: 5' 1 (1.549 m)    GENERAL: The patient is a well-nourished female, in no acute distress. The vital signs are documented above. CARDIAC: There is a regular rate and rhythm.  VASCULAR: SonoSite shows a brachial vein in the right arm at the axilla that does appear to be patent PULMONARY: Non-labored respirations SKIN:  There are no ulcers or rashes noted. PSYCHIATRIC: The patient has a normal affect.  STUDIES:     MEDICAL ISSUES:   ESRD: The patient is nearing the end of options for upper extremity access.  I think that she can have a right upper arm graft however I want to confirm that she has a good vein to tie into it that is patent into the central venous system.  On SonoSite ultrasound there is a brachial vein in the axilla that is patent.  I think she needs to have bilateral upper extremity venograms to confirm that she has options in the upper arm.  If not we will need to consider leg access.  After her venogram if she has a patent central system on the right, she can be scheduled for a right upper arm graft    [name], IV, MD, FACS [facility] [phone] Pager [phone]     [1] No Known Allergies  "

## 2024-02-05 ENCOUNTER — Other Ambulatory Visit: Payer: Self-pay

## 2024-02-05 ENCOUNTER — Ambulatory Visit (HOSPITAL_COMMUNITY)
Admission: RE | Admit: 2024-02-05 | Discharge: 2024-02-05 | Disposition: A | Discharge status: Home / Self Care | Attending: Surgery | Admitting: Surgery

## 2024-02-05 ENCOUNTER — Encounter (HOSPITAL_COMMUNITY)
Admission: RE | Disposition: A | Discharge status: Home / Self Care | Payer: Self-pay | Source: Home / Self Care | Attending: Surgery

## 2024-02-05 ENCOUNTER — Encounter (HOSPITAL_COMMUNITY): Payer: Self-pay | Admitting: Surgery

## 2024-02-05 DIAGNOSIS — Z992 Dependence on renal dialysis: Secondary | ICD-10-CM | POA: Diagnosis not present

## 2024-02-05 DIAGNOSIS — I509 Heart failure, unspecified: Secondary | ICD-10-CM | POA: Diagnosis not present

## 2024-02-05 DIAGNOSIS — N186 End stage renal disease: Secondary | ICD-10-CM | POA: Insufficient documentation

## 2024-02-05 DIAGNOSIS — Z79899 Other long term (current) drug therapy: Secondary | ICD-10-CM | POA: Diagnosis not present

## 2024-02-05 DIAGNOSIS — I132 Hypertensive heart and chronic kidney disease with heart failure and with stage 5 chronic kidney disease, or end stage renal disease: Secondary | ICD-10-CM | POA: Insufficient documentation

## 2024-02-05 HISTORY — PX: UPPER EXTREMITY VENOGRAPHY: CATH118272

## 2024-02-05 LAB — POCT I-STAT, CHEM 8
BUN: 41 mg/dL — ABNORMAL HIGH (ref 8–23)
Calcium, Ion: 1.14 mmol/L — ABNORMAL LOW (ref 1.15–1.40)
Chloride: 97 mmol/L — ABNORMAL LOW (ref 98–111)
Creatinine, Ser: 8 mg/dL — ABNORMAL HIGH (ref 0.44–1.00)
Glucose, Bld: 84 mg/dL (ref 70–99)
HCT: 38 % (ref 36.0–46.0)
Hemoglobin: 12.9 g/dL (ref 12.0–15.0)
Potassium: 5 mmol/L (ref 3.5–5.1)
Sodium: 140 mmol/L (ref 135–145)
TCO2: 31 mmol/L (ref 22–32)

## 2024-02-05 MED ORDER — IODIXANOL 320 MG/ML IV SOLN
INTRAVENOUS | Status: DC | PRN
  Administered 2024-02-05: 20 mL via INTRAVENOUS

## 2024-02-05 MED ORDER — SODIUM CHLORIDE 0.9% FLUSH: 3.0000 mL | INTRAVENOUS | Status: DC | PRN

## 2024-02-05 NOTE — Op Note (Signed)
" ° ° °  Patient name: Karen [name] MRN: [id number] DOB: April 07, 1955 Sex: female  02/05/2024 Pre-operative Diagnosis: ESRD Post-operative diagnosis:  Same Surgeon:  [name] Procedure Performed:  1.  Bilateral upper extremity venogram     Indications: 69 year old is here today for venogram for access planning  Procedure:  The patient was identified in the holding area and taken to room 8.  The patient was then placed supine on the table and prepped and draped in the usual sterile fashion.  A time out was called.  Contrast injections were performed through the IVs that were placed in the holding area  Findings:  The left central venous system is functionally occluded.  On the right the brachial and axillary veins above the previously placed stent (which is occluded) are patent.  There is sluggish flow around the existing catheter    Impression:  #1  Patient be scheduled for a right upper arm dialysis graft  V. [name], M.D., [facility] [place] Office: [phone] Pager:  [phone]  "

## 2024-02-05 NOTE — Interval H&P Note (Signed)
 History and Physical Interval Note:  02/05/2024 10:39 AM  [name]  has presented today for surgery, with the diagnosis of instage renal.  The various methods of treatment have been discussed with the patient and family. After consideration of risks, benefits and other options for treatment, the patient has consented to  Procedures: UPPER EXTREMITY VENOGRAPHY (N/A) UPPER EXTREMITY INTERVENTION (N/A) as a surgical intervention.  The patient's history has been reviewed, patient examined, no change in status, stable for surgery.  I have reviewed the patient's chart and labs.  Questions were answered to the patient's satisfaction.     [name]

## 2024-02-06 ENCOUNTER — Other Ambulatory Visit: Payer: Self-pay

## 2024-02-06 DIAGNOSIS — N186 End stage renal disease: Secondary | ICD-10-CM

## 2024-02-21 ENCOUNTER — Encounter (HOSPITAL_COMMUNITY): Payer: Self-pay | Admitting: Surgery

## 2024-02-21 ENCOUNTER — Other Ambulatory Visit: Payer: Self-pay

## 2024-02-21 NOTE — Anesthesia Preprocedure Evaluation (Signed)
 "                                  Anesthesia Evaluation  Patient identified by MRN, date of birth, ID band Patient awake    Reviewed: Allergy & Precautions, NPO status , Patient's Chart, lab work & pertinent test results  History of Anesthesia Complications Negative for: history of anesthetic complications  Airway Mallampati: II  TM Distance: >3 FB Neck ROM: Full    Dental  (+) Dental Advisory Given, Partial Lower, Partial Upper   Pulmonary shortness of breath   breath sounds clear to auscultation       Cardiovascular hypertension, Pt. on medications +CHF  (-) CAD, (-) Past MI and (-) Cardiac Stents + dysrhythmias Atrial Fibrillation  Rhythm:Regular Rate:Normal   1. Left ventricular ejection fraction, by estimation, is 60 to 65%. The  left ventricle has normal function. The left ventricle has no regional  wall motion abnormalities. Left ventricular diastolic parameters were  normal. The average left ventricular  global longitudinal strain is -18.4 %. The global longitudinal strain is  normal.   2. Right ventricular systolic function is normal. The right ventricular  size is normal. There is normal pulmonary artery systolic pressure. The  estimated right ventricular systolic pressure is 30.7 mmHg.   3. The mitral valve is normal in structure. No evidence of mitral valve  regurgitation. No evidence of mitral stenosis.   4. Tricuspid valve regurgitation is mild to moderate.   5. The aortic valve is tricuspid. There is mild thickening of the aortic  valve. Aortic valve regurgitation is not visualized. Aortic valve  sclerosis is present, with no evidence of aortic valve stenosis.   6. The inferior vena cava is normal in size with <50% respiratory  variability, suggesting right atrial pressure of 8 mmHg.     Neuro/Psych  Headaches    GI/Hepatic Neg liver ROS,GERD  ,,  Endo/Other  negative endocrine ROS    Renal/GU ESRF and DialysisRenal diseaseLast dialyzed  yesterday. K+ wnl     Musculoskeletal  (+) Arthritis ,    Abdominal  (+) + obese  Peds  Hematology  (+) Blood dyscrasia, anemia   Anesthesia Other Findings Per PAT note: Patient is a 69 year old female scheduled for the above procedure. Due to inclement weather, her HD this week has been on [facility].   RUE AVGG planned following venogram on 02/05/2024 showing left central venous system functionally occluded. The right brachial axillary veins above the previously placed stent (which was occluded) was patent.There was sluggish flow around the existing catheter.   History includes never smoker, ESRD (s/p renal transplant 08/01/2014 with early failure, s/p transplant nephrectomy 04/19/2017; on HD MWF, multiple failed HD access), afib/flutter (diagnosed ~ 11/2015, medical management, declined radiofrequency ablation), CHF, murmur, right breast cancer (diagnosed 06/2017; s/p Taxol  & Herceptin ; s/p right mastectomy 11/20/2017; right internal jugular Port-a-cath removed 08/12/2018), SDH (post fall, s/p left parietal craniotomy for hematoma evacuation 08/02/2020), exertional dyspnea, GERD, anemia. She reported temporary post-operative memory decline after 2005 surgery.     Last cardiology visit with Dr. Court was on 04/10/2023 for follow-up atypical chest pain and PAF history. PAF diagnosed ~ 2017. She declined ablation. No on anticoagulation then due to CHA2DS2VASc score of 2. 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 Last negative stress test was in 2021. Event monitor then showed predominantly SR with short runs of SVT.  No recent chest pain. She thought she had some breakthrough afib with RVR while on HD 04/25/2023, but by then time she was in the ED EKG showed SR with PACs.  She is managed on Cardizem  CD 120 mg on non-HD days and 30 mg on HD days. Echo on 10/16/2023 showed LVEF 55-60%, normal RV function, mild TR.    Anesthesia team to evaluate on the day of surgery.  ISTAT on arrival given ESRD.     VS: Ht 5' 1 (1.549 m)   Wt 82.6 kg   LMP  (LMP Unknown)   BMI 34.39 kg/m  BP Readings from Last 3 Encounters: 02/05/24 (!) 143/80 01/28/24 95/65 01/25/24 (!) 142/85   Pulse Readings from Last 3 Encounters: 02/05/24 86 01/28/24 77 01/25/24 90     PROVIDERS: Benjamine Aland, MD is PCP - Court Carrier, MD is cardiologist. Last visit 10/18/17 with Jerilynn Collar, NP. Patient no longer on metoprolol  due to hypotension, but takes diltiazem  on HD days). No anticoagulation due to CHADS VASC Score of 1. Plan to repeat echo in 01/2018 (due to chemo).  GLENWOOD Dow Arland Clement Lynwood, MD is Afib/EP. Last visit 08/23/17. GLENWOOD Rayburn Pac, MD is nephrologist. She last saw Sharl Purchase, MD at the [facility] 05/15/17. GLENWOOD Odean Potts, MD is HEM-ONC     LABS: For day of surgery. H/H 12.9/39.3, PLT 167K, A1c 5.7% on 12/18/2023 ([facility]     IMAGES: 1V PCXR 04/25/2023: FINDINGS: Stable cardiomediastinal silhouette. Right internal jugular dialysis catheter is noted with tip in expected position of right atrium. Left lung is clear. Bony thorax is unremarkable. IMPRESSION: No active disease.     EKG: 04/25/2023: Sinus rhythm Abnormal R-wave progression, early transition Confirmed by Freddi Hamilton [phone]) on 04/25/2023 3:36:43 P     CV: Echo 10/16/2023 ([facility]): SUMMARY  The left ventricular size is normal with normal left ventricular wall thickness. LV ejection  fraction = 55-60%. Left ventricular filling pattern is prolonged relaxation.  The right ventricle is normal in size and function.  Focal calcification of the aortic valve.  The IVC is normal in size with an inspiratory collapse of greater than 50%, suggesting normal right  atrial pressure.  There is no pericardial effusion.  There is no significant valvular stenosis or regurgitation.  There is mild tricuspid regurgitation.  Difficult to compare to the prior study due  to different image quality.      Long term monitor 05/01/2019 - 05/04/2019: 1. SR/ST 2. Occasional PVCs 3. Several episodes of SVT at rates up to 180     Nuclear stress test 05/01/2019:  The left ventricular ejection fraction is hyperdynamic (>65%).  Nuclear stress EF: 72%.  Blood pressure demonstrated a normal response to exercise.  There was no ST segment deviation noted during stress.  This is a low risk study. Normal resting and stress perfusion. No ischemia or infarction EF 72%     CT Coronary 01/11/2016: IMPRESSION: 1. Coronary calcium  score of 40. This was 44 percentile for age and sex matched control. 2. Normal coronary origin with right dominance. 3. Mild non-obstructive CAD (0-25% ostial LM, 25-50% ostial LCX).  Risk factor modification is recommended.     Reproductive/Obstetrics                              Anesthesia Physical Anesthesia Plan  ASA: 4  Anesthesia Plan: General   Post-op Pain Management: Tylenol  PO (pre-op )*  Induction: Intravenous  PONV Risk Score and Plan: 3 and Dexamethasone , Ondansetron  and Treatment may vary due to age or medical condition  Airway Management Planned: LMA  Additional Equipment: None  Intra-op Plan:   Post-operative Plan: Extubation in OR  Informed Consent: I have reviewed the patients History and Physical, chart, labs and discussed the procedure including the risks, benefits and alternatives for the proposed anesthesia with the patient or authorized representative who has indicated his/her understanding and acceptance.     Dental advisory given  Plan Discussed with: CRNA  Anesthesia Plan Comments: (Discussed risks of anesthesia with patient, including PONV, sore throat, lip/dental/eye damage. Rare risks discussed as well, such as cardiorespiratory and neurological sequelae, and allergic reactions. Discussed the role of CRNA in patient's perioperative care. Patient understands.)         Anesthesia Quick Evaluation  "

## 2024-02-21 NOTE — Progress Notes (Signed)
 Anesthesia Chart Review:  Case: [id number] Date/Time: 02/22/24 1145   Procedure: INSERTION, GRAFT, ARTERIOVENOUS, UPPER EXTREMITY (Right)   Anesthesia type: Choice   Diagnosis: ESRD (end stage renal disease) (HCC) [N18.6]   Pre-op  diagnosis: ESRD   [facility]   Surgeons: Serene Gaile ORN, MD       DISCUSSION: Patient is a 69 year old female scheduled for the above procedure. Due to inclement weather, her HD this week has been on [facility].  RUE AVGG planned following venogram on 02/05/2024 showing left central venous system functionally occluded. The right brachial axillary veins above the previously placed stent (which was occluded) was patent.There was sluggish flow around the existing catheter.  History includes never smoker, ESRD (s/p renal transplant 08/01/2014 with early failure, s/p transplant nephrectomy 04/19/2017; on HD MWF, multiple failed HD access), afib/flutter (diagnosed ~ 11/2015, medical management, declined radiofrequency ablation), CHF, murmur, right breast cancer (diagnosed 06/2017; s/p Taxol  & Herceptin ; s/p right mastectomy 11/20/2017; right internal jugular Port-a-cath removed 08/12/2018), SDH (post fall, s/p left parietal craniotomy for hematoma evacuation 08/02/2020), exertional dyspnea, GERD, anemia. She reported temporary post-operative memory decline after 2005 surgery.    Last cardiology visit with Dr. Court was on 04/10/2023 for follow-up atypical chest pain and PAF history. PAF diagnosed ~ 2017. She declined ablation. No on anticoagulation then due to CHA2DS2VASc score of 2. 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 Last negative stress test was in 2021. Event monitor then showed predominantly SR with short runs of SVT. No recent chest pain. She thought she had some breakthrough afib with RVR while on HD 04/25/2023, but by then time she was in the ED EKG showed SR with PACs.  She is managed on Cardizem  CD 120 mg on non-HD  days and 30 mg on HD days. Echo on 10/16/2023 showed LVEF 55-60%, normal RV function, mild TR.   Anesthesia team to evaluate on the day of surgery. ISTAT on arrival given ESRD.   VS: Ht 5' 1 (1.549 m)   Wt 82.6 kg   LMP  (LMP Unknown)   BMI 34.39 kg/m  BP Readings from Last 3 Encounters:  02/05/24 (!) 143/80  01/28/24 95/65  01/25/24 (!) 142/85   Pulse Readings from Last 3 Encounters:  02/05/24 86  01/28/24 77  01/25/24 90    PROVIDERS: Benjamine Aland, MD is PCP - Court Carrier, MD is cardiologist. Last visit 10/18/17 with Jerilynn Collar, NP. Patient no longer on metoprolol  due to hypotension, but takes diltiazem  on HD days). No anticoagulation due to CHADS VASC Score of 1. Plan to repeat echo in 01/2018 (due to chemo).  GLENWOOD Dow Arland Clement Lynwood, MD is Afib/EP. Last visit 08/23/17. GLENWOOD Rayburn Pac, MD is nephrologist. She last saw Sharl Purchase, MD at the [facility] 05/15/17. GLENWOOD Odean Potts, MD is HEM-ONC    LABS: For day of surgery. H/H 12.9/39.3, PLT 167K, A1c 5.7% on 12/18/2023 ([facility]   IMAGES: 1V PCXR 04/25/2023: FINDINGS: Stable cardiomediastinal silhouette. Right internal jugular dialysis catheter is noted with tip in expected position of right [facility]rium. Left lung is clear. Bony thorax is unremarkable. IMPRESSION: No active disease.   EKG: 04/25/2023: Sinus rhythm Abnormal R-wave progression, early transition Confirmed by Freddi Hamilton [phone]) on 04/25/2023 3:36:43 P   CV: Echo 10/16/2023 ([facility]): SUMMARY  The left ventricular size is normal with normal left ventricular wall thickness. LV ejection  fraction = 55-60%. Left ventricular filling pattern  is prolonged relaxation.  The right ventricle is normal in size and function.  Focal calcification of the aortic valve.  The IVC is normal in size with an inspiratory collapse of greater than 50%, suggesting normal right  atrial pressure.  There is no pericardial effusion.   There is no significant valvular stenosis or regurgitation.  There is mild tricuspid regurgitation.  Difficult to compare to the prior study due to different image quality.    Long term monitor 05/01/2019 - 05/04/2019: 1. SR/ST 2. Occasional PVCs 3. Several episodes of SVT at rates up to 180   Nuclear stress test 05/01/2019: The left ventricular ejection fraction is hyperdynamic (>65%). Nuclear stress EF: 72%. Blood pressure demonstrated a normal response to exercise. There was no ST segment deviation noted during stress. This is a low risk study. Normal resting and stress perfusion. No ischemia or infarction EF 72%   CT Coronary 01/11/2016: IMPRESSION: 1. Coronary calcium  score of 40. This was 64 percentile for age and sex matched control. 2. Normal coronary origin with right dominance. 3. Mild non-obstructive CAD (0-25% ostial LM, 25-50% ostial LCX).  Risk factor modification is recommended.    Past Medical History:  Diagnosis Date   Anemia    Arthritis    knees   Breast cancer (HCC) 2019   Right Breast Cancer   Cancer (HCC)    Chest pain    low risk nuc stress 04/2019   CHF (congestive heart failure) (HCC)    Colitis 12/08/2014   [facility]- focal moderate active colitis   Complication of anesthesia 2005   difficulty remembering for a while and waking up   Constipation    Dysrhythmia    h/o A-Fib   Elevated LFTs 2016   [facility]   ESRD on dialysis [facility]) 04/2014   MWF [place]   Fundic gland polyps of stomach, benign    GERD (gastroesophageal reflux disease)    Heart murmur    Ef 55-60%, no sig valvular abn on echo 07/2020   Hemodialysis patient    Hypertension    Shortness of breath dyspnea    with exertion    Past Surgical History:  Procedure Laterality Date   A/V FISTULAGRAM Right 04/22/2019   Procedure: A/V FISTULAGRAM - Right Upper;  Surgeon: Serene Gaile ORN, MD;  [facility];  Service: Cardiovascular;  Laterality:  Right;   A/V FISTULAGRAM N/A 02/17/2020   Procedure: A/V FISTULAGRAM - Right Upper;  Surgeon: Serene Gaile ORN, MD;  [facility];  Service: Cardiovascular;  Laterality: N/A;   A/V FISTULAGRAM Right 11/23/2020   Procedure: A/V FISTULAGRAM;  Surgeon: Serene Gaile ORN, MD;  [facility];  Service: Cardiovascular;  Laterality: Right;   A/V FISTULAGRAM Right 12/27/2021   Procedure: A/V Fistulagram;  Surgeon: Serene Gaile ORN, MD;  [facility];  Service: Cardiovascular;  Laterality: Right;   A/V FISTULAGRAM Right 09/13/2022   Procedure: A/V Fistulagram;  Surgeon: Lanis Fonda BRAVO, MD;  Location: [facility] INVASIVE CV LAB;  Service: Cardiovascular;  Laterality: Right;   A/V FISTULAGRAM Right 04/03/2023   Procedure: A/V Fistulagram;  Surgeon: Serene Gaile ORN, MD;  [facility];  Service: Cardiovascular;  Laterality: Right;   A/V FISTULAGRAM Right 11/15/2023   Procedure: A/V Fistulagram;  Surgeon: Serene Gaile ORN, MD;  Location: [facility];  Service: Cardiovascular;  Laterality: Right;   ABDOMINAL HYSTERECTOMY  2005   AV FISTULA PLACEMENT Left 12/09/2013  Procedure: INSERTION OF ARTERIOVENOUS (AV) GORE-TEX GRAFT ARM;  Surgeon: Carlin FORBES Haddock, MD;  Location: [facility] OR;  Service: Vascular;  Laterality: Left;   AV FISTULA PLACEMENT Right 01/01/2018   Procedure: INSERTION OF ARTERIOVENOUS (AV) GORE-TEX GRAFT ARM RIGHT ARM;  Surgeon: Haddock Carlin FORBES, MD;  Location: [facility] OR;  Service: Vascular;  Laterality: Right;   AV FISTULA PLACEMENT Right 09/17/2018   Procedure: 1ST STAGE BASILIC ARTERIOVENOUS (AV) FISTULA CREATION RIGHT ARM;  Surgeon: Sheree Penne Bruckner, MD;  Location: [facility] OR;  Service: Vascular;  Laterality: Right;   AVGG REMOVAL Left 02/10/2021   Procedure: REMOVAL OF LEFT ARM DIALYSIS GRAFT AND BRACHIAL ARTERY PATCH ANGIOPLASTY;  Surgeon: Serene Gaile ORN, MD;  [facility];  Service: Vascular;  Laterality: Left;   BASCILIC VEIN  TRANSPOSITION Right 11/12/2018   Procedure: SECOND STAGE BASILIC VEIN TRANSPOSITION RIGHT ARM;  Surgeon: Sheree Penne Bruckner, MD;  Location: [facility] OR;  Service: Vascular;  Laterality: Right;   BREAST BIOPSY  1990's   BUNIONECTOMY Bilateral    CHOLECYSTECTOMY  2005   COLONOSCOPY     CRANIOTOMY Left 08/02/2020   Procedure: CRANIOTOMY HEMATOMA EVACUATION SUBDURAL;  Surgeon: Joshua Alm RAMAN, MD;  Location: [facility] OR;  Service: Neurosurgery;  Laterality: Left;   DIALYSIS/PERMA CATHETER INSERTION Right 04/03/2023   Procedure: DIALYSIS/PERMA CATHETER INSERTION;  Surgeon: Serene Gaile ORN, MD;  [facility];  Service: Cardiovascular;  Laterality: Right;   DIALYSIS/PERMA CATHETER INSERTION N/A 01/25/2024   Procedure: DIALYSIS/PERMA CATHETER INSERTION;  Surgeon: Gretta Bruckner PARAS, MD;  Location: [facility];  Service: Cardiovascular;  Laterality: N/A;   FISTULOGRAM Right 06/12/2019   Procedure: Fistulogram of right upper arm arteriovenous fistula;  Surgeon: Serene Gaile ORN, MD;  Location: [facility] OR;  Service: Vascular;  Laterality: Right;   INSERTION OF ILIAC STENT  06/12/2019   Procedure: Insertion Of right basilic vein Stent;  Surgeon: Serene Gaile ORN, MD;  [facility];  Service: Vascular;;   IR AV DIALY SHUNT INTRO NEEDLE/INTRACATH INITIAL W/PTA/IMG RIGHT Right 03/04/2019   IR GENERIC HISTORICAL  01/11/2016   IR US  GUIDE VASC ACCESS RIGHT 01/11/2016 Ami Bellman, DO [facility]   IR GENERIC HISTORICAL  01/11/2016   IR RADIOLOGY PERIPHERAL GUIDED IV START 01/11/2016 Ami Bellman, DO [facility]   IR IMAGING GUIDED PORT INSERTION  07/05/2017   IR PATIENT EVAL TECH 0-60 MINS  08/08/2023   IR REMOVAL TUN CV CATH W/O FL  08/12/2019   IR REMOVAL TUN CV CATH W/O FL  07/03/2023   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   KNEE ARTHROSCOPY Bilateral    MASTECTOMY Right    MASTECTOMY W/ SENTINEL NODE BIOPSY Right 11/20/2017   MASTECTOMY W/ SENTINEL NODE BIOPSY Right 11/20/2017    Procedure: RIGHT MASTECTOMY WITH SENTINEL LYMPH NODE BIOPSY;  Surgeon: Vernetta Berg, MD;  [facility];  Service: General;  Laterality: Right;   PATCH ANGIOPLASTY Right 04/19/2023   Procedure: CYDNEY LISLE;  Surgeon: Serene Gaile ORN, MD;  [facility];  Service: Vascular;  Laterality: Right;   PERIPHERAL VASCULAR BALLOON ANGIOPLASTY  02/17/2020   Procedure: PERIPHERAL VASCULAR BALLOON ANGIOPLASTY;  Surgeon: Serene Gaile ORN, MD;  [facility];  Service: Cardiovascular;;   PERIPHERAL VASCULAR BALLOON ANGIOPLASTY  11/23/2020   Procedure: PERIPHERAL VASCULAR BALLOON ANGIOPLASTY;  Surgeon: Serene Gaile ORN, MD;  [facility];  Service: Cardiovascular;;   PERIPHERAL VASCULAR BALLOON ANGIOPLASTY  12/27/2021   Procedure: PERIPHERAL VASCULAR BALLOON ANGIOPLASTY;  Surgeon: Serene,  Gaile ORN, MD;  [facility];  Service: Cardiovascular;;   PERIPHERAL VASCULAR BALLOON ANGIOPLASTY Right 09/13/2022   Procedure: PERIPHERAL VASCULAR BALLOON ANGIOPLASTY;  Surgeon: Lanis Fonda BRAVO, MD;  Location: [facility] INVASIVE CV LAB;  Service: Cardiovascular;  Laterality: Right;   PERIPHERAL VASCULAR CATHETERIZATION N/A 08/31/2015   Procedure: A/V Shuntogram;  Surgeon: Gaile ORN New, MD;  [facility];  Service: Cardiovascular;  Laterality: N/A;   PERIPHERAL VASCULAR CATHETERIZATION Left 08/31/2015   Procedure: Peripheral Vascular Balloon Angioplasty;  Surgeon: Gaile ORN New, MD;  [facility];  Service: Cardiovascular;  Laterality: Left;  arm fistula   PERIPHERAL VASCULAR INTERVENTION Right 04/22/2019   Procedure: PERIPHERAL VASCULAR INTERVENTION;  Surgeon: New Gaile ORN, MD;  [facility];  Service: Cardiovascular;  Laterality: Right;  FISTULA   PERIPHERAL VASCULAR THROMBECTOMY Right 04/03/2023   Procedure: PERIPHERAL VASCULAR THROMBECTOMY;  Surgeon: New Gaile ORN, MD;  [facility];  Service:  Cardiovascular;  Laterality: Right;   PORT-A-CATH REMOVAL N/A 08/12/2018   Procedure: PORT-A-CATH REMOVAL;  Surgeon: Vernetta Berg, MD;  Location: [facility] OR;  Service: General;  Laterality: N/A;   REVISON OF ARTERIOVENOUS FISTULA Right 04/19/2023   Procedure: REVISON OF ARTERIOVENOUS FISTULA;  Surgeon: New Gaile ORN, MD;  [facility];  Service: Vascular;  Laterality: Right;   ROTATOR CUFF REPAIR Right 1997   UPPER EXTREMITY INTERVENTION Right 04/03/2023   Procedure: UPPER EXTREMITY INTERVENTION;  Surgeon: New Gaile ORN, MD;  [facility];  Service: Cardiovascular;  Laterality: Right;   UPPER EXTREMITY VENOGRAPHY Right 12/24/2017   Procedure: UPPER EXTREMITY VENOGRAPHY CENTRAL VENOGRAM;  Surgeon: Sheree Penne Bruckner, MD;  Location: [facility] INVASIVE CV LAB;  Service: Cardiovascular;  Laterality: Right;   UPPER EXTREMITY VENOGRAPHY Bilateral 09/02/2018   Procedure: UPPER EXTREMITY VENOGRAPHY;  Surgeon: Sheree Penne Bruckner, MD;  Location: [facility] INVASIVE CV LAB;  Service: Cardiovascular;  Laterality: Bilateral;   UPPER EXTREMITY VENOGRAPHY Bilateral 02/05/2024   Procedure: UPPER EXTREMITY VENOGRAPHY;  Surgeon: New Gaile ORN, MD;  [facility];  Service: Cardiovascular;  Laterality: Bilateral;   VENOUS ANGIOPLASTY  11/15/2023   Procedure: VENOUS ANGIOPLASTY;  Surgeon: New Gaile ORN, MD;  Location: [facility];  Service: Cardiovascular;;    MEDICATIONS:  atorvastatin  (LIPITOR) 10 MG tablet   B Complex-C-Folic Acid  (RENA-VITE RX) 1 MG TABS   benzonatate  (TESSALON ) 100 MG capsule   citalopram (CELEXA) 10 MG tablet   clobetasol cream (TEMOVATE) 0.05 %   diltiazem  (CARDIZEM  CD) 120 MG 24 hr capsule   diltiazem  (CARDIZEM ) 30 MG tablet   Doxercalciferol (HECTOROL IV)   ethyl chloride spray   fluticasone (FLONASE) 50 MCG/ACT nasal spray   lidocaine -prilocaine  (EMLA ) cream   midodrine  (PROAMATINE ) 10 MG tablet   multivitamin (RENA-VIT) TABS tablet    ondansetron  (ZOFRAN ) 8 MG tablet   prochlorperazine  (COMPAZINE ) 10 MG tablet   sevelamer  carbonate (RENVELA ) 800 MG tablet   She is not currently taking midodrine , Celexa, Tessalon , clobetasol cream.   Isaiah Ruder, PA-C Surgical Short Stay/Anesthesiology [facility] Phone [phone] [facility] Phone [phone] 02/21/2024 4:28 PM

## 2024-02-21 NOTE — Progress Notes (Signed)
 SDW call  Patient was given pre-op  instructions over the phone. Patient verbalized understanding of instructions provided.  Denied any SOB, fever or cough   PCP - Dr. Kennieth Leech Cardiologist - Dr. Dorn Lesches EP: Arland Don, NP Nephrologist: Riccardo, Dialysis M, W, F.   Due to weather and surgery she went to dialysis T, W,Th Pulmonary:    PPM/ICD - denies Device Orders - na Rep Notified - na   Chest x-ray - 04/25/2023 EKG -  04/25/2023 Stress Test -05/01/2019 ECHO - 05/14/2023 Cardiac Cath -   Sleep Study/sleep apnea/CPAP: denies  Non-diabetic  Blood Thinner Instructions: denies Aspirin  Instructions:denies   ERAS Protcol - NPO  Anesthesia review: Yes. HTN, CHF, ESRD, A-fib  Your procedure is scheduled on Friday February 22, 2024  Report to [facility] Main Entrance A at  0930  A.M., then check in with the Admitting office.  Call this number if you have problems the morning of surgery:  [phone]   If you have any questions prior to your surgery date call [phone]: Open Monday-Friday 8am-4pm If you experience any cold or flu symptoms such as cough, fever, chills, shortness of breath, etc. between now and your scheduled surgery, please notify us  at the above number    Remember:  Do not eat or drink after midnight the night before your surgery  Take these medicines the morning of surgery with A SIP OF WATER :  States she takes no meds in the morning  As needed: Flonase, zofran , compazine   As of today, STOP taking any Aspirin  (unless otherwise instructed by your surgeon) Aleve, Naproxen, Ibuprofen , Motrin , Advil , Goody's, BC's, all herbal medications, fish oil, and all vitamins.

## 2024-02-22 ENCOUNTER — Ambulatory Visit (HOSPITAL_COMMUNITY): Admission: RE | Admit: 2024-02-22 | Discharge: 2024-02-22 | Disposition: A | Attending: Surgery | Admitting: Surgery

## 2024-02-22 ENCOUNTER — Encounter (HOSPITAL_COMMUNITY): Payer: Self-pay | Admitting: Surgery

## 2024-02-22 ENCOUNTER — Encounter (HOSPITAL_COMMUNITY): Admission: RE | Disposition: A | Payer: Self-pay | Source: Home / Self Care | Attending: Surgery

## 2024-02-22 ENCOUNTER — Other Ambulatory Visit (HOSPITAL_COMMUNITY): Payer: Self-pay

## 2024-02-22 ENCOUNTER — Encounter (HOSPITAL_COMMUNITY): Admitting: Vascular Surgery

## 2024-02-22 DIAGNOSIS — N186 End stage renal disease: Secondary | ICD-10-CM

## 2024-02-22 HISTORY — DX: Traumatic subdural hemorrhage with loss of consciousness status unknown, initial encounter: S06.5XAA

## 2024-02-22 LAB — POCT I-STAT, CHEM 8
BUN: 19 mg/dL (ref 8–23)
Calcium, Ion: 1.06 mmol/L — ABNORMAL LOW (ref 1.15–1.40)
Chloride: 99 mmol/L (ref 98–111)
Creatinine, Ser: 5 mg/dL — ABNORMAL HIGH (ref 0.44–1.00)
Glucose, Bld: 76 mg/dL (ref 70–99)
HCT: 37 % (ref 36.0–46.0)
Hemoglobin: 12.6 g/dL (ref 12.0–15.0)
Potassium: 4.3 mmol/L (ref 3.5–5.1)
Sodium: 141 mmol/L (ref 135–145)
TCO2: 28 mmol/L (ref 22–32)

## 2024-02-22 MED ORDER — HEPARIN SODIUM (PORCINE) 1000 UNIT/ML IJ SOLN
INTRAMUSCULAR | Status: DC | PRN
  Administered 2024-02-22: 3000 [IU] via INTRAVENOUS

## 2024-02-22 MED ORDER — FENTANYL CITRATE (PF) 100 MCG/2ML IJ SOLN
25.0000 ug | INTRAMUSCULAR | Status: DC | PRN
  Administered 2024-02-22: 25 ug via INTRAVENOUS

## 2024-02-22 MED ORDER — DROPERIDOL 2.5 MG/ML IJ SOLN: 0.6250 mg | Freq: Once | INTRAMUSCULAR | Status: DC | PRN

## 2024-02-22 MED ORDER — CHLORHEXIDINE GLUCONATE 4 % EX SOLN: 60.0000 mL | Freq: Once | CUTANEOUS | Status: DC

## 2024-02-22 MED ORDER — CHLORHEXIDINE GLUCONATE 0.12 % MT SOLN
OROMUCOSAL | Status: AC
  Administered 2024-02-22: 15 mL via OROMUCOSAL
  Filled 2024-02-22: qty 15

## 2024-02-22 MED ORDER — BUPIVACAINE LIPOSOME 1.3 % IJ SUSP
INTRAMUSCULAR | Status: AC
  Filled 2024-02-22: qty 20

## 2024-02-22 MED ORDER — ACETAMINOPHEN 10 MG/ML IV SOLN
INTRAVENOUS | Status: AC
  Filled 2024-02-22: qty 100

## 2024-02-22 MED ORDER — ACETAMINOPHEN 10 MG/ML IV SOLN
1000.0000 mg | Freq: Once | INTRAVENOUS | Status: DC | PRN
  Administered 2024-02-22: 1000 mg via INTRAVENOUS

## 2024-02-22 MED ORDER — OXYCODONE HCL 5 MG PO TABS
5.0000 mg | ORAL_TABLET | ORAL | 0 refills | Status: AC | PRN
  Filled 2024-02-22: qty 12, 2d supply, fill #0

## 2024-02-22 MED ORDER — OXYCODONE HCL 5 MG/5ML PO SOLN: 5.0000 mg | Freq: Once | ORAL | Status: DC | PRN

## 2024-02-22 MED ORDER — PROPOFOL 10 MG/ML IV BOLUS
INTRAVENOUS | Status: DC | PRN
  Administered 2024-02-22: 130 mg via INTRAVENOUS

## 2024-02-22 MED ORDER — ORAL CARE MOUTH RINSE: 15.0000 mL | Freq: Once | OROMUCOSAL | Status: AC

## 2024-02-22 MED ORDER — BUPIVACAINE HCL (PF) 0.5 % IJ SOLN
INTRAMUSCULAR | Status: DC | PRN
  Administered 2024-02-22: 30 mL

## 2024-02-22 MED ORDER — DEXAMETHASONE SOD PHOSPHATE PF 10 MG/ML IJ SOLN
INTRAMUSCULAR | Status: DC | PRN
  Administered 2024-02-22: 5 mg via INTRAVENOUS

## 2024-02-22 MED ORDER — PHENYLEPHRINE 80 MCG/ML (10ML) SYRINGE FOR IV PUSH (FOR BLOOD PRESSURE SUPPORT)
PREFILLED_SYRINGE | INTRAVENOUS | Status: AC
  Filled 2024-02-22: qty 10

## 2024-02-22 MED ORDER — PHENYLEPHRINE HCL-NACL 20-0.9 MG/250ML-% IV SOLN
INTRAVENOUS | Status: DC | PRN
  Administered 2024-02-22: 50 ug/min via INTRAVENOUS

## 2024-02-22 MED ORDER — HEMOSTATIC AGENTS (NO CHARGE) OPTIME
TOPICAL | Status: DC | PRN
  Administered 2024-02-22: 1 via TOPICAL

## 2024-02-22 MED ORDER — CEFAZOLIN SODIUM-DEXTROSE 2-4 GM/100ML-% IV SOLN
2.0000 g | INTRAVENOUS | Status: AC
  Administered 2024-02-22: 2 g via INTRAVENOUS
  Filled 2024-02-22: qty 100

## 2024-02-22 MED ORDER — SODIUM CHLORIDE 0.9 % IV SOLN: INTRAVENOUS | Status: DC

## 2024-02-22 MED ORDER — BUPIVACAINE LIPOSOME 1.3 % IJ SUSP
INTRAMUSCULAR | Status: DC | PRN
  Administered 2024-02-22: 20 mL

## 2024-02-22 MED ORDER — FENTANYL CITRATE (PF) 250 MCG/5ML IJ SOLN
INTRAMUSCULAR | Status: DC | PRN
  Administered 2024-02-22 (×2): 25 ug via INTRAVENOUS
  Administered 2024-02-22: 50 ug via INTRAVENOUS

## 2024-02-22 MED ORDER — FENTANYL CITRATE (PF) 100 MCG/2ML IJ SOLN
INTRAMUSCULAR | Status: AC
  Filled 2024-02-22: qty 2

## 2024-02-22 MED ORDER — HEPARIN 6000 UNIT IRRIGATION SOLUTION
Status: DC | PRN
  Administered 2024-02-22: 1

## 2024-02-22 MED ORDER — BUPIVACAINE HCL (PF) 0.5 % IJ SOLN
INTRAMUSCULAR | Status: AC
  Filled 2024-02-22: qty 30

## 2024-02-22 MED ORDER — ONDANSETRON HCL 4 MG/2ML IJ SOLN
INTRAMUSCULAR | Status: DC | PRN
  Administered 2024-02-22: 4 mg via INTRAVENOUS

## 2024-02-22 MED ORDER — LIDOCAINE 2% (20 MG/ML) 5 ML SYRINGE
INTRAMUSCULAR | Status: AC
  Filled 2024-02-22: qty 5

## 2024-02-22 MED ORDER — 0.9 % SODIUM CHLORIDE (POUR BTL) OPTIME
TOPICAL | Status: DC | PRN
  Administered 2024-02-22: 1000 mL

## 2024-02-22 MED ORDER — PHENYLEPHRINE 80 MCG/ML (10ML) SYRINGE FOR IV PUSH (FOR BLOOD PRESSURE SUPPORT)
PREFILLED_SYRINGE | INTRAVENOUS | Status: DC | PRN
  Administered 2024-02-22 (×3): 80 ug via INTRAVENOUS

## 2024-02-22 MED ORDER — CHLORHEXIDINE GLUCONATE 0.12 % MT SOLN: 15.0000 mL | Freq: Once | OROMUCOSAL | Status: AC

## 2024-02-22 MED ORDER — OXYCODONE HCL 5 MG PO TABS: 5.0000 mg | ORAL_TABLET | Freq: Once | ORAL | Status: DC | PRN

## 2024-02-22 MED ORDER — LIDOCAINE 2% (20 MG/ML) 5 ML SYRINGE
INTRAMUSCULAR | Status: DC | PRN
  Administered 2024-02-22: 80 mg via INTRAVENOUS

## 2024-02-22 NOTE — Progress Notes (Signed)
 Graft site noticed to be bruised and very swollen at assessment. Patient still able to move hand, full sensation and palpable radial pulse. Graft had a positive thrill and bruit. Dr. Sheree at bedside to assess the patient and orders received to wrap arm in ACE wrap and patient can still d/c home.  Marty Needles, RN

## 2024-02-22 NOTE — Anesthesia Procedure Notes (Signed)
 Procedure Name: LMA Insertion Date/Time: 02/22/2024 1:54 PM  Performed by: Vertie Arthea RAMAN, CRNAPre-anesthesia Checklist: Patient identified, Emergency Drugs available, Suction available and Patient being monitored Patient Re-evaluated:Patient Re-evaluated prior to induction Oxygen Delivery Method: Circle System Utilized Preoxygenation: Pre-oxygenation with 100% oxygen Induction Type: IV induction Ventilation: Mask ventilation without difficulty LMA: LMA inserted LMA Size: 3.0 Number of attempts: 1 Airway Equipment and Method: Bite block Placement Confirmation: positive ETCO2 Tube secured with: Tape Dental Injury: Teeth and Oropharynx as per pre-operative assessment

## 2024-02-22 NOTE — Discharge Instructions (Signed)
 "  [facility]  Discharge Instructions  AV Fistula or Graft Surgery for Dialysis Access  Please refer to the following instructions for your post-procedure care. Your surgeon or physician assistant will discuss any changes with you.  Activity  You may drive the day following your surgery, if you are comfortable and no longer taking prescription pain medication. Resume full activity as the soreness in your incision resolves.  Bathing/Showering  You may shower after you go home. Keep your incision dry for 48 hours. Do not soak in a bathtub, hot tub, or swim until the incision heals completely. You may not shower if you have a hemodialysis catheter.  Incision Care  Clean your incision with mild soap and water  after 48 hours. Pat the area dry with a clean towel. You do not need a bandage unless otherwise instructed. Do not apply any ointments or creams to your incision. You may have skin glue on your incision. Do not peel it off. It will come off on its own in about one week. Your arm may swell a bit after surgery. To reduce swelling use pillows to elevate your arm so it is above your heart. Your doctor will tell you if you need to lightly wrap your arm with an ACE bandage.  Diet  Resume your normal diet. There are not special food restrictions following this procedure. In order to heal from your surgery, it is CRITICAL to get adequate nutrition. Your body requires vitamins, minerals, and protein. Vegetables are the best source of vitamins and minerals. Vegetables also provide the perfect balance of protein. Processed food has little nutritional value, so try to avoid this.  Medications  Resume taking all of your medications. If your incision is causing pain, you may take over-the counter pain relievers such as acetaminophen  (Tylenol ). If you were prescribed a stronger pain medication, please be aware these medications can cause nausea and constipation. Prevent  nausea by taking the medication with a snack or meal. Avoid constipation by drinking plenty of fluids and eating foods with high amount of fiber, such as fruits, vegetables, and grains.  Do not take Tylenol  if you are taking prescription pain medications.  Follow up Your surgeon may want to see you in the office following your access surgery. If so, this will be arranged at the time of your surgery.  Please call us  immediately for any of the following conditions:  Increased pain, redness, drainage (pus) from your incision site Fever of 101 degrees or higher Severe or worsening pain at your incision site Hand pain or numbness.  Reduce your risk of vascular disease:  Stop smoking. If you would like help, call [facility] at 1-800-QUIT-NOW ((204)859-6870) or [facility] at 947-529-8512  Manage your cholesterol Maintain a desired weight Control your diabetes Keep your blood pressure down  Dialysis  It will take several weeks to several months for your new dialysis access to be ready for use. Your surgeon will determine when it is okay to use it. Your nephrologist will continue to direct your dialysis. You can continue to use your Permcath until your new access is ready for use.   02/22/2024 Karen [name] [id number] June 22, 1955  Surgeon(s): Serene Gaile ORN, MD  Procedures: INSERTION, GRAFT, ARTERIOVENOUS, UPPER EXTREMITY   May stick graft immediately   May stick graft on designated area only:   x Do not stick graft for 4 weeks    If you have any questions, please call the office at [phone].  "

## 2024-02-22 NOTE — Transfer of Care (Signed)
 Immediate Anesthesia Transfer of Care Note  Patient: Karen [name]  Procedure(s) Performed: INSERTION, GRAFT, ARTERIOVENOUS, UPPER EXTREMITY (Right)  Patient Location: PACU  Anesthesia Type:General  Level of Consciousness: awake, alert , and oriented  Airway & Oxygen Therapy: Patient Spontanous Breathing and Patient connected to face mask oxygen  Post-op Assessment: Report given to RN and Post -op Vital signs reviewed and stable  Post vital signs: Reviewed and stable  Last Vitals:  Vitals Value Taken Time  BP 114/80 02/22/24 16:03  Temp    Pulse 103 02/22/24 16:04  Resp 14 02/22/24 16:04  SpO2 100 % 02/22/24 16:04  Vitals shown include unfiled device data.  Last Pain:  Vitals:   02/22/24 0929  TempSrc:   PainSc: 0-No pain         Complications: No notable events documented.

## 2024-02-22 NOTE — Op Note (Signed)
" ° ° °  Patient name: Karen [name] MRN: [id number] DOB: Jan 08, 1956 Sex: female  02/22/2024 Pre-operative Diagnosis: ERD Post-operative diagnosis:  Same Surgeon:  [name] Assistants:  EMERSON Kent, PA, Warren Boss Procedure:   Redo right upper arm dialysis graft Anesthesia:  General Blood Loss:  minimal Specimens:  none  Findings: End-to-side anastomosis to the brachial artery just above the antecubital crease.  End-to-side anastomosis to the axillary vein  Indications: This is a 69 year old female with a history of multiple upper extremity grafts.  She is now dialyzing through a catheter.  She had a venogram that confirmed patency of her central venous system and so she comes in for redo right arm graft as a last attempt to avoid femoral access  Procedure:  The patient was identified in the holding area and taken to [facility] OR ROOM 11  The patient was then placed supine on the table. general anesthesia was administered.  The patient was prepped and draped in the usual sterile fashion.  A time out was called and antibiotics were administered.  A PA was necessary to expect the procedure and assist with technical details.  She helped with exposure by providing suction and retraction.  She helped with the anastomosis by following the suture.  She helped with wound closure.  A longitudinal incision was made in the upper arm just above antecubital crease.  Through this incision I dissected out the brachial artery which was a 4 mm disease-free artery.  I then used ultrasound to identify the axillary vein high up into the axilla.  I made a longitudinal incision that went up into the hairline.  Through this incision I dissected out the axillary vein.  This was surrounded by scar tissue from her previous grafts.  Ultimately I was able to dissect out the vein which was approximately 5 mm.  I then placed Exparel  in the anticipated location of the tunnel and used a curved Gore tunneler to create a tunnel.   I then brought a 6 mm graft through the tunnel.  The patient was given 3000 units of heparin .  The brachial artery was then occluded with vascular clamps and a #11 blade was used to make an arteriotomy which was extended longitudinally with Potts scissors.  The graft was beveled to fit this of the arteriotomy and an end-to-side anastomosis was performed with 6-0 Prolene.  Prior to completion, the appropriate flushing maneuvers were performed and the anastomosis was completed.  There was excellent flow through the graft.  The graft was flushed with heparin  saline and reoccluded.  I had contemplated a end-to-end anastomosis however because of the scar tissue in the groin and the angle in which the graft was coming into the vein I did not think that this was advisable and so I elected to perform a end-to-side anastomosis.  This was done with running 6-0 Prolene.  Once this was completed, the clamps were released.  The patient had a palpable thrill within the graft.  There was a Doppler signal in the radial artery.  The wound was then irrigated.  Hemostasis was achieved.  The incisions were closed by reapproximating the subcutaneous tissue with 3-0 Vicryl and skin with 4-0 Vicryl followed by Dermabond.  She was successfully extubated and taken recovery stable condition.   Disposition: To PACU stable   V. [name], M.D., [facility] [place] Office: [phone] Pager:  [phone]  "

## 2024-02-22 NOTE — Interval H&P Note (Signed)
 History and Physical Interval Note:  02/22/2024 1:22 PM  [name]  has presented today for surgery, with the diagnosis of ESRD.  The various methods of treatment have been discussed with the patient and family. After consideration of risks, benefits and other options for treatment, the patient has consented to  Procedures: INSERTION, GRAFT, ARTERIOVENOUS, UPPER EXTREMITY (Right) as a surgical intervention.  The patient's history has been reviewed, patient examined, no change in status, stable for surgery.  I have reviewed the patient's chart and labs.  Questions were answered to the patient's satisfaction.     [name]  Plan for right upper arm avgg  wb

## 2024-02-26 ENCOUNTER — Inpatient Hospital Stay: Payer: Medicare Other | Admitting: Hematology and Oncology

## 2024-03-10 ENCOUNTER — Encounter
